# Patient Record
Sex: Male | Born: 1955 | Race: Black or African American | Hispanic: No | Marital: Single | Smoking: Current every day smoker
Health system: Southern US, Community
[De-identification: ages and names within clinical notes are randomized; demographics above are authoritative.]

## PROBLEM LIST (undated history)

## (undated) DIAGNOSIS — R519 Headache, unspecified: Secondary | ICD-10-CM

## (undated) DIAGNOSIS — F1010 Alcohol abuse, uncomplicated: Secondary | ICD-10-CM

## (undated) DIAGNOSIS — K859 Acute pancreatitis without necrosis or infection, unspecified: Secondary | ICD-10-CM

## (undated) DIAGNOSIS — M1990 Unspecified osteoarthritis, unspecified site: Secondary | ICD-10-CM

## (undated) DIAGNOSIS — Z720 Tobacco use: Secondary | ICD-10-CM

## (undated) DIAGNOSIS — E119 Type 2 diabetes mellitus without complications: Secondary | ICD-10-CM

## (undated) DIAGNOSIS — F419 Anxiety disorder, unspecified: Secondary | ICD-10-CM

## (undated) DIAGNOSIS — I509 Heart failure, unspecified: Secondary | ICD-10-CM

## (undated) DIAGNOSIS — K219 Gastro-esophageal reflux disease without esophagitis: Secondary | ICD-10-CM

## (undated) DIAGNOSIS — E785 Hyperlipidemia, unspecified: Secondary | ICD-10-CM

## (undated) DIAGNOSIS — I10 Essential (primary) hypertension: Secondary | ICD-10-CM

## (undated) DIAGNOSIS — I639 Cerebral infarction, unspecified: Secondary | ICD-10-CM

## (undated) DIAGNOSIS — R51 Headache: Secondary | ICD-10-CM

## (undated) DIAGNOSIS — K759 Inflammatory liver disease, unspecified: Secondary | ICD-10-CM

## (undated) DIAGNOSIS — M069 Rheumatoid arthritis, unspecified: Secondary | ICD-10-CM

## (undated) HISTORY — DX: Unspecified osteoarthritis, unspecified site: M19.90

## (undated) HISTORY — PX: WRIST SURGERY: SHX841

## (undated) HISTORY — DX: Anxiety disorder, unspecified: F41.9

## (undated) HISTORY — DX: Rheumatoid arthritis, unspecified: M06.9

## (undated) HISTORY — DX: Gastro-esophageal reflux disease without esophagitis: K21.9

## (undated) HISTORY — DX: Tobacco use: Z72.0

## (undated) HISTORY — PX: OTHER SURGICAL HISTORY: SHX169

## (undated) HISTORY — PX: COLONOSCOPY: SHX5424

## (undated) HISTORY — DX: Alcohol abuse, uncomplicated: F10.10

## (undated) HISTORY — PX: FRACTURE SURGERY: SHX138

## (undated) HISTORY — DX: Hyperlipidemia, unspecified: E78.5

---

## 1992-08-11 DIAGNOSIS — F1020 Alcohol dependence, uncomplicated: Secondary | ICD-10-CM | POA: Insufficient documentation

## 1993-04-09 DIAGNOSIS — F1210 Cannabis abuse, uncomplicated: Secondary | ICD-10-CM | POA: Insufficient documentation

## 1993-04-09 DIAGNOSIS — F1421 Cocaine dependence, in remission: Secondary | ICD-10-CM | POA: Insufficient documentation

## 1993-04-09 DIAGNOSIS — I517 Cardiomegaly: Secondary | ICD-10-CM | POA: Insufficient documentation

## 1999-03-04 ENCOUNTER — Emergency Department (HOSPITAL_COMMUNITY): Admission: EM | Admit: 1999-03-04 | Discharge: 1999-03-04 | Payer: Self-pay | Admitting: Emergency Medicine

## 1999-03-04 ENCOUNTER — Encounter: Payer: Self-pay | Admitting: Emergency Medicine

## 2000-09-27 ENCOUNTER — Emergency Department (HOSPITAL_COMMUNITY): Admission: EM | Admit: 2000-09-27 | Discharge: 2000-09-27 | Payer: Self-pay | Admitting: Emergency Medicine

## 2001-04-01 ENCOUNTER — Emergency Department (HOSPITAL_COMMUNITY): Admission: EM | Admit: 2001-04-01 | Discharge: 2001-04-01 | Payer: Self-pay | Admitting: Emergency Medicine

## 2003-04-05 ENCOUNTER — Emergency Department (HOSPITAL_COMMUNITY): Admission: EM | Admit: 2003-04-05 | Discharge: 2003-04-05 | Payer: Self-pay

## 2003-04-05 ENCOUNTER — Encounter: Payer: Self-pay | Admitting: Emergency Medicine

## 2003-04-07 ENCOUNTER — Encounter: Admission: RE | Admit: 2003-04-07 | Discharge: 2003-04-07 | Payer: Self-pay | Admitting: Internal Medicine

## 2003-05-17 ENCOUNTER — Encounter: Admission: RE | Admit: 2003-05-17 | Discharge: 2003-05-17 | Payer: Self-pay | Admitting: Internal Medicine

## 2003-06-21 ENCOUNTER — Encounter: Admission: RE | Admit: 2003-06-21 | Discharge: 2003-06-21 | Payer: Self-pay | Admitting: Internal Medicine

## 2003-11-26 ENCOUNTER — Ambulatory Visit (HOSPITAL_COMMUNITY): Admission: RE | Admit: 2003-11-26 | Discharge: 2003-11-26 | Payer: Self-pay | Admitting: Cardiology

## 2004-11-30 ENCOUNTER — Encounter
Admission: RE | Admit: 2004-11-30 | Discharge: 2005-01-24 | Payer: Self-pay | Admitting: Physical Medicine & Rehabilitation

## 2004-12-03 ENCOUNTER — Ambulatory Visit: Payer: Self-pay | Admitting: Physical Medicine & Rehabilitation

## 2005-01-24 ENCOUNTER — Encounter
Admission: RE | Admit: 2005-01-24 | Discharge: 2005-04-24 | Payer: Self-pay | Admitting: Physical Medicine & Rehabilitation

## 2005-03-08 ENCOUNTER — Ambulatory Visit: Payer: Self-pay | Admitting: Physical Medicine & Rehabilitation

## 2005-04-16 ENCOUNTER — Ambulatory Visit: Payer: Self-pay | Admitting: Physical Medicine & Rehabilitation

## 2005-05-23 ENCOUNTER — Ambulatory Visit: Payer: Self-pay | Admitting: Physical Medicine & Rehabilitation

## 2005-05-23 ENCOUNTER — Encounter
Admission: RE | Admit: 2005-05-23 | Discharge: 2005-08-21 | Payer: Self-pay | Admitting: Physical Medicine & Rehabilitation

## 2005-08-18 ENCOUNTER — Emergency Department (HOSPITAL_COMMUNITY): Admission: EM | Admit: 2005-08-18 | Discharge: 2005-08-19 | Payer: Self-pay | Admitting: Emergency Medicine

## 2005-08-20 ENCOUNTER — Emergency Department (HOSPITAL_COMMUNITY): Admission: EM | Admit: 2005-08-20 | Discharge: 2005-08-20 | Payer: Self-pay | Admitting: Family Medicine

## 2005-08-28 ENCOUNTER — Emergency Department (HOSPITAL_COMMUNITY): Admission: EM | Admit: 2005-08-28 | Discharge: 2005-08-28 | Payer: Self-pay | Admitting: Family Medicine

## 2006-04-11 ENCOUNTER — Ambulatory Visit: Payer: Self-pay | Admitting: Physical Medicine & Rehabilitation

## 2006-04-23 ENCOUNTER — Emergency Department (HOSPITAL_COMMUNITY): Admission: EM | Admit: 2006-04-23 | Discharge: 2006-04-23 | Payer: Self-pay | Admitting: Family Medicine

## 2006-09-23 ENCOUNTER — Emergency Department (HOSPITAL_COMMUNITY): Admission: EM | Admit: 2006-09-23 | Discharge: 2006-09-23 | Payer: Self-pay | Admitting: Family Medicine

## 2006-12-29 ENCOUNTER — Inpatient Hospital Stay (HOSPITAL_COMMUNITY): Admission: EM | Admit: 2006-12-29 | Discharge: 2006-12-30 | Payer: Self-pay | Admitting: Family Medicine

## 2007-01-08 DIAGNOSIS — F411 Generalized anxiety disorder: Secondary | ICD-10-CM | POA: Insufficient documentation

## 2007-01-08 DIAGNOSIS — E7800 Pure hypercholesterolemia, unspecified: Secondary | ICD-10-CM | POA: Insufficient documentation

## 2007-01-08 DIAGNOSIS — M1990 Unspecified osteoarthritis, unspecified site: Secondary | ICD-10-CM | POA: Insufficient documentation

## 2007-01-08 DIAGNOSIS — K219 Gastro-esophageal reflux disease without esophagitis: Secondary | ICD-10-CM | POA: Insufficient documentation

## 2007-01-08 DIAGNOSIS — E785 Hyperlipidemia, unspecified: Secondary | ICD-10-CM

## 2007-01-08 DIAGNOSIS — F1910 Other psychoactive substance abuse, uncomplicated: Secondary | ICD-10-CM | POA: Insufficient documentation

## 2007-01-08 DIAGNOSIS — M069 Rheumatoid arthritis, unspecified: Secondary | ICD-10-CM | POA: Insufficient documentation

## 2007-01-08 DIAGNOSIS — E669 Obesity, unspecified: Secondary | ICD-10-CM

## 2007-01-08 DIAGNOSIS — R0789 Other chest pain: Secondary | ICD-10-CM

## 2007-01-08 DIAGNOSIS — J438 Other emphysema: Secondary | ICD-10-CM

## 2007-01-28 ENCOUNTER — Telehealth: Payer: Self-pay | Admitting: *Deleted

## 2007-03-02 ENCOUNTER — Ambulatory Visit: Payer: Self-pay | Admitting: *Deleted

## 2007-03-02 ENCOUNTER — Encounter (INDEPENDENT_AMBULATORY_CARE_PROVIDER_SITE_OTHER): Payer: Self-pay | Admitting: *Deleted

## 2007-03-02 DIAGNOSIS — I10 Essential (primary) hypertension: Secondary | ICD-10-CM | POA: Insufficient documentation

## 2007-03-02 DIAGNOSIS — IMO0002 Reserved for concepts with insufficient information to code with codable children: Secondary | ICD-10-CM | POA: Insufficient documentation

## 2007-03-16 ENCOUNTER — Telehealth (INDEPENDENT_AMBULATORY_CARE_PROVIDER_SITE_OTHER): Payer: Self-pay | Admitting: *Deleted

## 2007-03-27 ENCOUNTER — Telehealth (INDEPENDENT_AMBULATORY_CARE_PROVIDER_SITE_OTHER): Payer: Self-pay | Admitting: *Deleted

## 2007-03-31 ENCOUNTER — Ambulatory Visit: Payer: Self-pay | Admitting: Internal Medicine

## 2007-04-27 ENCOUNTER — Telehealth: Payer: Self-pay | Admitting: *Deleted

## 2007-05-12 ENCOUNTER — Encounter (INDEPENDENT_AMBULATORY_CARE_PROVIDER_SITE_OTHER): Payer: Self-pay | Admitting: *Deleted

## 2007-05-12 ENCOUNTER — Ambulatory Visit: Payer: Self-pay | Admitting: *Deleted

## 2007-05-12 DIAGNOSIS — F528 Other sexual dysfunction not due to a substance or known physiological condition: Secondary | ICD-10-CM

## 2007-05-14 ENCOUNTER — Telehealth: Payer: Self-pay | Admitting: *Deleted

## 2007-05-27 ENCOUNTER — Telehealth (INDEPENDENT_AMBULATORY_CARE_PROVIDER_SITE_OTHER): Payer: Self-pay | Admitting: *Deleted

## 2007-05-28 ENCOUNTER — Ambulatory Visit: Payer: Self-pay | Admitting: *Deleted

## 2007-05-28 ENCOUNTER — Encounter (INDEPENDENT_AMBULATORY_CARE_PROVIDER_SITE_OTHER): Payer: Self-pay | Admitting: *Deleted

## 2007-05-28 LAB — CONVERTED CEMR LAB
Barbiturate Quant, Ur: NEGATIVE
Cocaine Metabolites: NEGATIVE
Creatinine,U: 217.4 mg/dL
Phencyclidine (PCP): NEGATIVE

## 2007-05-29 ENCOUNTER — Telehealth (INDEPENDENT_AMBULATORY_CARE_PROVIDER_SITE_OTHER): Payer: Self-pay | Admitting: *Deleted

## 2007-06-19 ENCOUNTER — Emergency Department (HOSPITAL_COMMUNITY): Admission: EM | Admit: 2007-06-19 | Discharge: 2007-06-19 | Payer: Self-pay | Admitting: Family Medicine

## 2007-06-24 ENCOUNTER — Telehealth: Payer: Self-pay | Admitting: Infectious Disease

## 2007-07-27 ENCOUNTER — Telehealth: Payer: Self-pay | Admitting: *Deleted

## 2007-08-21 ENCOUNTER — Telehealth (INDEPENDENT_AMBULATORY_CARE_PROVIDER_SITE_OTHER): Payer: Self-pay | Admitting: *Deleted

## 2007-08-27 ENCOUNTER — Telehealth: Payer: Self-pay | Admitting: Internal Medicine

## 2007-09-10 ENCOUNTER — Ambulatory Visit: Payer: Self-pay | Admitting: Internal Medicine

## 2007-09-17 ENCOUNTER — Telehealth (INDEPENDENT_AMBULATORY_CARE_PROVIDER_SITE_OTHER): Payer: Self-pay | Admitting: *Deleted

## 2007-10-13 ENCOUNTER — Telehealth (INDEPENDENT_AMBULATORY_CARE_PROVIDER_SITE_OTHER): Payer: Self-pay | Admitting: *Deleted

## 2007-10-26 ENCOUNTER — Telehealth: Payer: Self-pay | Admitting: *Deleted

## 2007-11-04 ENCOUNTER — Encounter (INDEPENDENT_AMBULATORY_CARE_PROVIDER_SITE_OTHER): Payer: Self-pay | Admitting: *Deleted

## 2007-11-09 ENCOUNTER — Emergency Department (HOSPITAL_COMMUNITY): Admission: EM | Admit: 2007-11-09 | Discharge: 2007-11-09 | Payer: Self-pay | Admitting: Family Medicine

## 2007-11-23 ENCOUNTER — Ambulatory Visit: Payer: Self-pay | Admitting: Internal Medicine

## 2007-11-23 ENCOUNTER — Telehealth: Payer: Self-pay | Admitting: *Deleted

## 2007-11-23 ENCOUNTER — Encounter (INDEPENDENT_AMBULATORY_CARE_PROVIDER_SITE_OTHER): Payer: Self-pay | Admitting: *Deleted

## 2007-11-23 DIAGNOSIS — K861 Other chronic pancreatitis: Secondary | ICD-10-CM | POA: Insufficient documentation

## 2007-11-23 LAB — CONVERTED CEMR LAB
ALT: 43 units/L (ref 0–53)
AST: 51 units/L — ABNORMAL HIGH (ref 0–37)
Alkaline Phosphatase: 58 units/L (ref 39–117)
Bacteria, UA: NONE SEEN
Basophils Absolute: 0 10*3/uL (ref 0.0–0.1)
Benzodiazepines.: NEGATIVE
Bilirubin Urine: NEGATIVE
CO2: 23 meq/L (ref 19–32)
Cocaine Metabolites: NEGATIVE
Creatinine,U: 216.6 mg/dL
Eosinophils Absolute: 0.1 10*3/uL (ref 0.0–0.7)
Ketones, ur: NEGATIVE mg/dL
Lymphs Abs: 5.3 10*3/uL — ABNORMAL HIGH (ref 0.7–4.0)
MCV: 101.8 fL — ABNORMAL HIGH (ref 78.0–100.0)
Magnesium: 2.2 mg/dL (ref 1.5–2.5)
Microalb Creat Ratio: 85.8 mg/g — ABNORMAL HIGH (ref 0.0–30.0)
Microalb, Ur: 18.4 mg/dL — ABNORMAL HIGH (ref 0.00–1.89)
Neutrophils Relative %: 49 % (ref 43–77)
Phencyclidine (PCP): NEGATIVE
Platelets: 297 10*3/uL (ref 150–400)
Propoxyphene: NEGATIVE
RBC / HPF: NONE SEEN (ref <3)
RDW: 12.5 % (ref 11.5–15.5)
Sodium: 138 meq/L (ref 135–145)
Specific Gravity, Urine: 1.022 (ref 1.005–1.03)
Total Bilirubin: 0.8 mg/dL (ref 0.3–1.2)
Total Protein: 8.6 g/dL — ABNORMAL HIGH (ref 6.0–8.3)
Urine Glucose: NEGATIVE mg/dL
WBC, UA: NONE SEEN cells/hpf (ref <3)
WBC: 12.3 10*3/uL — ABNORMAL HIGH (ref 4.0–10.5)
pH: 6 (ref 5.0–8.0)

## 2007-11-24 ENCOUNTER — Telehealth (INDEPENDENT_AMBULATORY_CARE_PROVIDER_SITE_OTHER): Payer: Self-pay | Admitting: *Deleted

## 2007-12-01 ENCOUNTER — Ambulatory Visit: Payer: Self-pay | Admitting: Internal Medicine

## 2007-12-01 ENCOUNTER — Encounter (INDEPENDENT_AMBULATORY_CARE_PROVIDER_SITE_OTHER): Payer: Self-pay | Admitting: *Deleted

## 2007-12-01 LAB — CONVERTED CEMR LAB
ALT: 30 units/L (ref 0–53)
Albumin: 4.7 g/dL (ref 3.5–5.2)
Bilirubin Urine: NEGATIVE
Blood in Urine, dipstick: NEGATIVE
CO2: 21 meq/L (ref 19–32)
Calcium: 10.1 mg/dL (ref 8.4–10.5)
Chloride: 103 meq/L (ref 96–112)
Eosinophils Absolute: 0.1 10*3/uL (ref 0.0–0.7)
Glucose, Bld: 98 mg/dL (ref 70–99)
HCT: 45.8 % (ref 39.0–52.0)
Ketones, urine, test strip: NEGATIVE
Lipase: 84 units/L — ABNORMAL HIGH (ref 0–75)
Lymphocytes Relative: 63 % — ABNORMAL HIGH (ref 12–46)
Lymphs Abs: 5.9 10*3/uL — ABNORMAL HIGH (ref 0.7–4.0)
MCV: 100 fL (ref 78.0–100.0)
Monocytes Relative: 10 % (ref 3–12)
Neutrophils Relative %: 27 % — ABNORMAL LOW (ref 43–77)
Platelets: 277 10*3/uL (ref 150–400)
RBC: 4.58 M/uL (ref 4.22–5.81)
Sodium: 137 meq/L (ref 135–145)
Total Protein: 8.7 g/dL — ABNORMAL HIGH (ref 6.0–8.3)
WBC Urine, dipstick: NEGATIVE
WBC: 9.4 10*3/uL (ref 4.0–10.5)

## 2007-12-15 ENCOUNTER — Ambulatory Visit: Payer: Self-pay | Admitting: Internal Medicine

## 2008-03-12 ENCOUNTER — Emergency Department (HOSPITAL_COMMUNITY): Admission: EM | Admit: 2008-03-12 | Discharge: 2008-03-12 | Payer: Self-pay | Admitting: Emergency Medicine

## 2008-03-13 ENCOUNTER — Ambulatory Visit: Payer: Self-pay | Admitting: Internal Medicine

## 2008-03-13 ENCOUNTER — Inpatient Hospital Stay (HOSPITAL_COMMUNITY): Admission: EM | Admit: 2008-03-13 | Discharge: 2008-03-15 | Payer: Self-pay | Admitting: Emergency Medicine

## 2008-03-25 ENCOUNTER — Ambulatory Visit: Payer: Self-pay | Admitting: *Deleted

## 2008-03-25 ENCOUNTER — Encounter (INDEPENDENT_AMBULATORY_CARE_PROVIDER_SITE_OTHER): Payer: Self-pay | Admitting: *Deleted

## 2008-03-25 DIAGNOSIS — D72829 Elevated white blood cell count, unspecified: Secondary | ICD-10-CM | POA: Insufficient documentation

## 2008-03-25 DIAGNOSIS — R945 Abnormal results of liver function studies: Secondary | ICD-10-CM | POA: Insufficient documentation

## 2008-03-25 LAB — CONVERTED CEMR LAB
ALT: 23 units/L (ref 0–53)
Albumin: 3.5 g/dL (ref 3.5–5.2)
Basophils Absolute: 0 10*3/uL (ref 0.0–0.1)
CO2: 28 meq/L (ref 19–32)
Chloride: 102 meq/L (ref 96–112)
Eosinophils Relative: 1 % (ref 0–5)
Glucose, Bld: 128 mg/dL — ABNORMAL HIGH (ref 70–99)
Lymphocytes Relative: 44 % (ref 12–46)
Lymphs Abs: 4.6 10*3/uL — ABNORMAL HIGH (ref 0.7–4.0)
Neutro Abs: 5 10*3/uL (ref 1.7–7.7)
Neutrophils Relative %: 48 % (ref 43–77)
Platelets: 298 10*3/uL (ref 150–400)
Potassium: 3.9 meq/L (ref 3.5–5.3)
RDW: 13.1 % (ref 11.5–15.5)
Sodium: 137 meq/L (ref 135–145)
Total Bilirubin: 0.6 mg/dL (ref 0.3–1.2)
Total Protein: 6.9 g/dL (ref 6.0–8.3)
WBC: 10.4 10*3/uL (ref 4.0–10.5)

## 2008-03-30 ENCOUNTER — Telehealth (INDEPENDENT_AMBULATORY_CARE_PROVIDER_SITE_OTHER): Payer: Self-pay | Admitting: *Deleted

## 2008-06-06 ENCOUNTER — Telehealth: Payer: Self-pay | Admitting: Internal Medicine

## 2008-06-20 ENCOUNTER — Telehealth: Payer: Self-pay | Admitting: Internal Medicine

## 2008-09-02 ENCOUNTER — Telehealth: Payer: Self-pay | Admitting: Internal Medicine

## 2008-09-06 ENCOUNTER — Encounter: Payer: Self-pay | Admitting: Internal Medicine

## 2008-10-18 ENCOUNTER — Encounter: Payer: Self-pay | Admitting: Internal Medicine

## 2008-11-08 ENCOUNTER — Encounter: Payer: Self-pay | Admitting: Internal Medicine

## 2008-11-08 ENCOUNTER — Ambulatory Visit: Payer: Self-pay | Admitting: Infectious Disease

## 2008-11-09 DIAGNOSIS — J309 Allergic rhinitis, unspecified: Secondary | ICD-10-CM | POA: Insufficient documentation

## 2008-11-09 LAB — CONVERTED CEMR LAB
AST: 34 units/L (ref 0–37)
Alkaline Phosphatase: 108 units/L (ref 39–117)
Glucose, Bld: 98 mg/dL (ref 70–99)
HDL: 40 mg/dL (ref >39)
LDL Cholesterol: 24 mg/dL (ref 0–99)
Rhuematoid fact SerPl-aCnc: 60 intl units/mL — ABNORMAL HIGH (ref 0–20)
Sodium: 136 meq/L (ref 135–145)
Total Bilirubin: 0.8 mg/dL (ref 0.3–1.2)
Total Protein: 8.4 g/dL — ABNORMAL HIGH (ref 6.0–8.3)
Triglycerides: 352 mg/dL — ABNORMAL HIGH (ref <150)
VLDL: 70 mg/dL — ABNORMAL HIGH (ref 0–40)

## 2008-11-27 ENCOUNTER — Encounter: Payer: Self-pay | Admitting: Internal Medicine

## 2008-12-03 ENCOUNTER — Telehealth (INDEPENDENT_AMBULATORY_CARE_PROVIDER_SITE_OTHER): Payer: Self-pay | Admitting: Internal Medicine

## 2008-12-05 ENCOUNTER — Encounter: Payer: Self-pay | Admitting: Internal Medicine

## 2008-12-05 ENCOUNTER — Ambulatory Visit: Payer: Self-pay | Admitting: Internal Medicine

## 2008-12-05 ENCOUNTER — Other Ambulatory Visit: Admission: RE | Admit: 2008-12-05 | Discharge: 2008-12-05 | Payer: Self-pay | Admitting: Internal Medicine

## 2008-12-05 DIAGNOSIS — B081 Molluscum contagiosum: Secondary | ICD-10-CM

## 2008-12-06 ENCOUNTER — Encounter: Payer: Self-pay | Admitting: Internal Medicine

## 2008-12-07 ENCOUNTER — Encounter: Payer: Self-pay | Admitting: Internal Medicine

## 2008-12-14 ENCOUNTER — Emergency Department (HOSPITAL_COMMUNITY): Admission: EM | Admit: 2008-12-14 | Discharge: 2008-12-14 | Payer: Self-pay | Admitting: Emergency Medicine

## 2008-12-16 ENCOUNTER — Inpatient Hospital Stay (HOSPITAL_COMMUNITY): Admission: EM | Admit: 2008-12-16 | Discharge: 2008-12-19 | Payer: Self-pay | Admitting: Emergency Medicine

## 2008-12-16 ENCOUNTER — Ambulatory Visit: Payer: Self-pay | Admitting: Internal Medicine

## 2008-12-19 ENCOUNTER — Encounter (INDEPENDENT_AMBULATORY_CARE_PROVIDER_SITE_OTHER): Payer: Self-pay | Admitting: Internal Medicine

## 2008-12-19 DIAGNOSIS — E876 Hypokalemia: Secondary | ICD-10-CM

## 2008-12-27 ENCOUNTER — Telehealth: Payer: Self-pay | Admitting: Internal Medicine

## 2009-01-04 ENCOUNTER — Encounter: Payer: Self-pay | Admitting: Internal Medicine

## 2009-01-11 ENCOUNTER — Encounter: Payer: Self-pay | Admitting: Internal Medicine

## 2009-01-11 ENCOUNTER — Ambulatory Visit: Payer: Self-pay | Admitting: Internal Medicine

## 2009-01-11 LAB — CONVERTED CEMR LAB
AST: 35 units/L (ref 0–37)
Alkaline Phosphatase: 124 units/L — ABNORMAL HIGH (ref 39–117)
BUN: 6 mg/dL (ref 6–23)
Creatinine, Ser: 0.85 mg/dL (ref 0.40–1.50)
HDL: 38 mg/dL — ABNORMAL LOW (ref >39)
Hemoglobin, Urine: NEGATIVE
LDL Cholesterol: 60 mg/dL (ref 0–99)
Leukocytes, UA: NEGATIVE
Nitrite: NEGATIVE
Protein, ur: NEGATIVE mg/dL
Total CHOL/HDL Ratio: 4.5
Urobilinogen, UA: 0.2 (ref 0.0–1.0)
VLDL: 73 mg/dL — ABNORMAL HIGH (ref 0–40)

## 2009-01-27 ENCOUNTER — Telehealth: Payer: Self-pay | Admitting: Internal Medicine

## 2009-01-30 ENCOUNTER — Telehealth: Payer: Self-pay | Admitting: Internal Medicine

## 2009-02-24 ENCOUNTER — Telehealth: Payer: Self-pay | Admitting: Internal Medicine

## 2009-03-28 ENCOUNTER — Encounter: Payer: Self-pay | Admitting: Internal Medicine

## 2009-04-14 ENCOUNTER — Ambulatory Visit: Payer: Self-pay | Admitting: Internal Medicine

## 2009-04-14 ENCOUNTER — Encounter: Payer: Self-pay | Admitting: Internal Medicine

## 2009-04-14 LAB — CONVERTED CEMR LAB
ALT: 28 units/L (ref 0–53)
AST: 34 units/L (ref 0–37)
CO2: 29 meq/L (ref 19–32)
Creatinine, Ser: 0.93 mg/dL (ref 0.40–1.50)
GFR calc Af Amer: >60 mL/min (ref >60)
HDL: 37 mg/dL — ABNORMAL LOW (ref >39)
LDL Cholesterol: 44 mg/dL (ref 0–99)
Total Bilirubin: 0.6 mg/dL (ref 0.3–1.2)
Triglycerides: 301 mg/dL — ABNORMAL HIGH (ref <150)

## 2009-04-27 ENCOUNTER — Telehealth: Payer: Self-pay | Admitting: Internal Medicine

## 2009-05-26 ENCOUNTER — Telehealth: Payer: Self-pay | Admitting: Internal Medicine

## 2009-05-30 ENCOUNTER — Telehealth: Payer: Self-pay | Admitting: Internal Medicine

## 2009-06-28 ENCOUNTER — Telehealth: Payer: Self-pay | Admitting: Internal Medicine

## 2009-07-26 ENCOUNTER — Telehealth: Payer: Self-pay | Admitting: Internal Medicine

## 2009-08-21 ENCOUNTER — Ambulatory Visit: Payer: Self-pay | Admitting: Internal Medicine

## 2009-08-22 ENCOUNTER — Telehealth: Payer: Self-pay | Admitting: Internal Medicine

## 2009-08-22 ENCOUNTER — Encounter (INDEPENDENT_AMBULATORY_CARE_PROVIDER_SITE_OTHER): Payer: Self-pay | Admitting: Internal Medicine

## 2009-08-22 LAB — CONVERTED CEMR LAB
ALT: 30 units/L (ref 0–53)
CO2: 19 meq/L (ref 19–32)
Cholesterol: 135 mg/dL (ref 0–200)
Creatinine, Ser: 0.87 mg/dL (ref 0.40–1.50)
HDL: 49 mg/dL (ref >39)
Total Bilirubin: 0.5 mg/dL (ref 0.3–1.2)
Total CHOL/HDL Ratio: 2.8
Triglycerides: 108 mg/dL (ref <150)
VLDL: 22 mg/dL (ref 0–40)

## 2009-08-28 ENCOUNTER — Telehealth: Payer: Self-pay | Admitting: Internal Medicine

## 2009-09-27 ENCOUNTER — Telehealth: Payer: Self-pay | Admitting: Internal Medicine

## 2009-10-26 ENCOUNTER — Telehealth: Payer: Self-pay | Admitting: Internal Medicine

## 2009-11-27 ENCOUNTER — Telehealth: Payer: Self-pay | Admitting: Internal Medicine

## 2009-12-11 ENCOUNTER — Telehealth: Payer: Self-pay | Admitting: Internal Medicine

## 2009-12-26 ENCOUNTER — Telehealth: Payer: Self-pay | Admitting: Internal Medicine

## 2010-01-26 ENCOUNTER — Telehealth: Payer: Self-pay | Admitting: Internal Medicine

## 2010-02-22 ENCOUNTER — Telehealth: Payer: Self-pay | Admitting: Internal Medicine

## 2010-03-27 ENCOUNTER — Telehealth: Payer: Self-pay | Admitting: Internal Medicine

## 2010-04-02 ENCOUNTER — Encounter: Payer: Self-pay | Admitting: Internal Medicine

## 2010-04-02 ENCOUNTER — Ambulatory Visit: Payer: Self-pay | Admitting: Internal Medicine

## 2010-04-03 LAB — CONVERTED CEMR LAB
Amphetamine Screen, Ur: NEGATIVE
Barbiturate Quant, Ur: NEGATIVE
Cocaine Metabolites: NEGATIVE
Creatinine, Urine: 124.7 mg/dL
Marijuana Metabolite: NEGATIVE
Opiates: POSITIVE — AB
Propoxyphene: NEGATIVE

## 2010-04-27 ENCOUNTER — Telehealth: Payer: Self-pay | Admitting: Internal Medicine

## 2010-05-22 ENCOUNTER — Telehealth: Payer: Self-pay | Admitting: Internal Medicine

## 2010-06-19 ENCOUNTER — Telehealth: Payer: Self-pay | Admitting: Internal Medicine

## 2010-07-26 ENCOUNTER — Telehealth: Payer: Self-pay | Admitting: Internal Medicine

## 2010-08-09 ENCOUNTER — Encounter: Payer: Self-pay | Admitting: Internal Medicine

## 2010-08-24 ENCOUNTER — Telehealth: Payer: Self-pay | Admitting: Internal Medicine

## 2010-09-17 ENCOUNTER — Encounter: Payer: Self-pay | Admitting: Internal Medicine

## 2010-09-25 ENCOUNTER — Telehealth: Payer: Self-pay | Admitting: Internal Medicine

## 2010-10-02 ENCOUNTER — Ambulatory Visit: Payer: Self-pay | Admitting: Internal Medicine

## 2010-10-03 ENCOUNTER — Encounter: Payer: Self-pay | Admitting: Internal Medicine

## 2010-10-03 LAB — CONVERTED CEMR LAB
AST: 65 units/L — ABNORMAL HIGH (ref 0–37)
Albumin: 3.8 g/dL (ref 3.5–5.2)
Alkaline Phosphatase: 96 units/L (ref 39–117)
BUN: 7 mg/dL (ref 6–23)
Calcium: 9.2 mg/dL (ref 8.4–10.5)
Creatinine, Ser: 0.84 mg/dL (ref 0.40–1.50)
Cyclic Citrullin Peptide Ab: <2 units (ref 0.0–5.0)
HCT: 40.8 % (ref 39.0–52.0)
HDL: 51 mg/dL (ref >39)
Indirect Bilirubin: 0.4 mg/dL (ref 0.0–0.9)
MCV: 97.8 fL (ref >=78.0)
Platelets: 194 10*3/uL (ref 150–400)
RDW: 12.4 % (ref 11.5–15.5)
Total Bilirubin: 0.5 mg/dL (ref 0.3–1.2)
Total Protein: 6.9 g/dL (ref 6.0–8.3)
Triglycerides: 227 mg/dL — ABNORMAL HIGH (ref <150)

## 2010-10-17 ENCOUNTER — Ambulatory Visit: Payer: Self-pay | Admitting: Internal Medicine

## 2010-10-17 DIAGNOSIS — M79609 Pain in unspecified limb: Secondary | ICD-10-CM | POA: Insufficient documentation

## 2010-10-24 ENCOUNTER — Telehealth (INDEPENDENT_AMBULATORY_CARE_PROVIDER_SITE_OTHER): Payer: Self-pay | Admitting: *Deleted

## 2010-11-22 ENCOUNTER — Telehealth: Payer: Self-pay | Admitting: *Deleted

## 2010-11-27 NOTE — Progress Notes (Signed)
Summary: refill/gg  Phone Note Refill Request  on April 27, 2010 10:29 AM  Refills Requested: Medication #1:  HYDROCODONE-ACETAMINOPHEN 5-500 MG  TABS Take 1 tablet by mouth every 6 hours as needed   Last Refilled: 03/27/2010  Medication #2:  ALPRAZOLAM 1 MG TABS Take a half tab as needed twice a day and one tablet at bedtime   Last Refilled: 03/27/2010  Method Requested: Telephone to Pharmacy Initial call taken by: Merrie Roof RN,  April 27, 2010 10:30 AM  Follow-up for Phone Call        Refill approved-nurse to complete    Prescriptions: HYDROCODONE-ACETAMINOPHEN 5-500 MG  TABS (HYDROCODONE-ACETAMINOPHEN) Take 1 tablet by mouth every 6 hours as needed  #120 x 0   Entered and Authorized by:   Vassie Loll MD   Signed by:   Vassie Loll MD on 04/27/2010   Method used:   Telephoned to ...       [facility] [phone]* (retail)       [address]       [place], [place]  [address]       Ph: [phone] or [phone]       Fax: [phone]   RxID:   [id number] ALPRAZOLAM 1 MG TABS (ALPRAZOLAM) Take a half tab as needed twice a day and one tablet at bedtime  #62 x 0   Entered and Authorized by:   Vassie Loll MD   Signed by:   Vassie Loll MD on 04/27/2010   Method used:   Telephoned to ...       [facility] [phone]* (retail)       [address]       [place], [place]  [address]       Ph: [phone] or [phone]       Fax: [phone]   RxID:   [id number]   Appended Document: refill/gg rx called in

## 2010-11-27 NOTE — Assessment & Plan Note (Signed)
Summary: ACUTE-REQUESTING MEDICATION REFILL/CFB   Vital Signs:  Patient profile:   55 year old male Height:      67 inches Weight:      160.0 pounds BMI:     25.15 Temp:     97.4 degrees F oral Pulse rate:   50 / minute BP sitting:   113 / 67  (right arm)  Vitals Entered By: Filomena Jungling NT II (October 02, 2010 11:06 AM) CC: WART ON BOTTOM OF FOOT/ WRIST PAIN AND HEADACHES Is Patient Diabetic? No Pain Assessment Patient in pain? yes     Location: WRIST, HEADACHES Intensity: 3 Type: aching Nutritional Status BMI of 25 - 29 = overweight  Have you ever been in a relationship where you felt threatened, hurt or afraid?No   Does patient need assistance? Functional Status Self care Ambulation Normal   Primary Care Provider:  Vassie Loll MD  CC:  WART ON BOTTOM OF FOOT/ WRIST PAIN AND HEADACHES.  History of Present Illness: Pt with multiple medical problems as outlined below, here on f/u visit, complaining of mild headache and wrist pain (has chronic pain at baseline but states this is a bit worse). Pain is currently 3/10. He is taking Hydrocodone 5/500mg . He was last seen here in June 2011 by Dr. Rogelia Boga, and it seems he may have a number of diagnosis that are not actually proven by lab and/or imaging.  No other acute issues today. Suggested he tried Ibuprofen for his pain, as there may be an inflammatory component. Otherwise, will followup on issues from his prior visit in June,order the labs to confirm his RA, no changes to his med regimen will be made today and he should followup with Dr. Gwenlyn Perking in about a month.   Will check labs below per Dr. Lenoria Farrier office request and fax them over so he doesn't need to get them twice.   Preventive Screening-Counseling & Management  Alcohol-Tobacco     Alcohol type:  BEER EVERY NOW AND THEN     Smoking Status: current     Smoking Cessation Counseling: yes     Packs/Day: 3-4 cigs per day     Year Started: AT THE AGE OF 18  Passive Smoke Exposure: no  Caffeine-Diet-Exercise     Does Patient Exercise: yes     Type of exercise: WALKING     Times/week: 1-2  Current Problems (verified): 1)  Uti  (ICD-599.0) 2)  Hypokalemia  (ICD-276.8) 3)  Molluscum Contagiosum  (ICD-078.0) 4)  Allergic Rhinitis  (ICD-477.9) 5)  Liver Function Tests, Abnormal  (ICD-794.8) 6)  Leukocytosis Unspecified  (ICD-288.60) 7)  Pancreatitis, Chronic  (ICD-577.1) 8)  Erectile Dysfunction  (ICD-302.72) 9)  Hypertension  (ICD-401.9) 10)  Shin Splints  (ICD-844.9) 11)  Emphysema  (ICD-492.8) 12)  Obesity Nos  (ICD-278.00) 13)  Substance Abuse, Multiple  (ICD-305.90) 14)  Chest Pain, Non-cardiac  (ICD-786.59) 15)  Gerd  (ICD-530.81) 16)  Osteoarthritis  (ICD-715.90) 17)  Rheumatoid Arthritis  (ICD-714.0) 18)  Hyperlipidemia  (ICD-272.4) 19)  Anxiety  (ICD-300.00)  Current Medications (verified): 1)  Metoprolol Tartrate 25 Mg Tabs (Metoprolol Tartrate) .... Take 1 Tablet By Mouth Two Times A Day 2)  Aspir-Low 81 Mg Tbec (Aspirin) .... Take 1 Tablet By Mouth Once A Day 3)  Lopid 600 Mg Tabs (Gemfibrozil) .... Take 1 Tablet By Mouth Two Times A Day 4)  Lisinopril 5 Mg Tabs (Lisinopril) .... Take 1 Tablet By Mouth Once A Day 5)  Alprazolam 1 Mg Tabs (Alprazolam) .Marland KitchenMarland KitchenMarland Kitchen  Take A Half Tab As Needed Twice A Day and One Tablet At Bedtime 6)  Hydrocodone-Acetaminophen 5-500 Mg  Tabs (Hydrocodone-Acetaminophen) .... Take 1 Tablet By Mouth Every 6 Hours As Needed 7)  Amlodipine Besylate 2.5 Mg Tabs (Amlodipine Besylate) .... Take 1 Tablet By Mouth Once A Day  Allergies (verified): No Known Drug Allergies  Review of Systems  The patient denies fever, vision loss, chest pain, dyspnea on exertion, prolonged cough, abdominal pain, melena, hematochezia, muscle weakness, unusual weight change, and abnormal bleeding.    Physical Exam  General:  alert, well-developed, and well-nourished.   Head:  normocephalic and atraumatic.   Eyes:  vision  grossly intact.   Ears:  R ear normal and L ear normal.   Nose:  no external deformity.   Mouth:  poor dentition.   Lungs:  normal respiratory effort, normal breath sounds, no crackles, and no wheezes.   Heart:  normal rate, regular rhythm, no murmur, no gallop, and no rub.   Abdomen:  soft, non-tender, normal bowel sounds, no distention, no masses, no guarding, and no rigidity.   Msk:  normal ROM, no joint swelling, no joint warmth, no redness over joints, and no joint deformities.   Extremities:  no edema or cyanosis Neurologic:  alert & oriented X3 and gait normal.   Skin:  color normal  Psych:  Oriented X3, normally interactive, good eye contact, not anxious appearing, and not depressed appearing.     Impression & Recommendations:  Problem # 1:  RHEUMATOID ARTHRITIS (ICD-714.0) Per last note, will followup on labs as below.   Encouraged to take over the counter Ibuprofen in the meantime for his chronic pain, which is in addition to his hydrocodone, instructed to call the clinic if the symptoms worsen or if he has further questions/concerns.   Orders: T-C-Reactive Protein [phone]) T-Rheumatoid Factor ([phone]) T- * Misc. Laboratory test [phone])  Problem # 2:  HYPERTENSION (ICD-401.9) Well controlled on current regimen. Will need a BMP at his next visit.   His updated medication list for this problem includes:    Metoprolol Tartrate 25 Mg Tabs (Metoprolol tartrate) .Marland Kitchen... Take 1 tablet by mouth two times a day    Lisinopril 5 Mg Tabs (Lisinopril) .Marland Kitchen... Take 1 tablet by mouth once a day    Amlodipine Besylate 2.5 Mg Tabs (Amlodipine besylate) .Marland Kitchen... Take 1 tablet by mouth once a day  BP today: 113/67 Prior BP: 149/83 (04/02/2010)  Labs Reviewed: K+: 4.1 (08/22/2009) Creat: : 0.87 (08/22/2009)   Chol: 135 (08/22/2009)   HDL: 49 (08/22/2009)   LDL: 64 (08/22/2009)   TG: 108 (08/22/2009)  His updated medication list for this problem includes:    Metoprolol Tartrate 25  Mg Tabs (Metoprolol tartrate) .Marland Kitchen... Take 1 tablet by mouth two times a day    Lisinopril 5 Mg Tabs (Lisinopril) .Marland Kitchen... Take 1 tablet by mouth once a day    Amlodipine Besylate 2.5 Mg Tabs (Amlodipine besylate) .Marland Kitchen... Take 1 tablet by mouth once a day  Problem # 3:  HYPERLIPIDEMIA (ICD-272.4) Will need FLP at his next visit.  His updated medication list for this problem includes:    Lopid 600 Mg Tabs (Gemfibrozil) .Marland Kitchen... Take 1 tablet by mouth two times a day  Orders: T-Lipid Profile ([id number])  Labs Reviewed: SGOT: 29 (08/22/2009)   SGPT: 30 (08/22/2009)   HDL:49 (08/22/2009), 37 (04/14/2009)  LDL:64 (08/22/2009), 44 (04/14/2009)  Chol:135 (08/22/2009), 141 (04/14/2009)  Trig:108 (08/22/2009), 301 (04/14/2009)  Complete Medication List: 1)  Metoprolol Tartrate  25 Mg Tabs (Metoprolol tartrate) .... Take 1 tablet by mouth two times a day 2)  Aspir-low 81 Mg Tbec (Aspirin) .... Take 1 tablet by mouth once a day 3)  Lopid 600 Mg Tabs (Gemfibrozil) .... Take 1 tablet by mouth two times a day 4)  Lisinopril 5 Mg Tabs (Lisinopril) .... Take 1 tablet by mouth once a day 5)  Alprazolam 1 Mg Tabs (Alprazolam) .... Take a half tab as needed twice a day and one tablet at bedtime 6)  Hydrocodone-acetaminophen 5-500 Mg Tabs (Hydrocodone-acetaminophen) .... Take 1 tablet by mouth every 6 hours as needed 7)  Amlodipine Besylate 2.5 Mg Tabs (Amlodipine besylate) .... Take 1 tablet by mouth once a day  Other Orders: T-Hemoccult Card-Multiple (take home) (16109) T-Basic Metabolic Panel ([id number]) T-CBC No Diff ([id number]) T-Hepatic Function [phone]) T-PSA (96295-28413) T-Vitamin D (25-Hydroxy) [phone])  Patient Instructions: 1)  You can take over the counter Ibuprofen for your headaches and wrist pain. Make sure to take with meals and only as needed. 2)  Pls schedule a followup appointment with Dr. Gwenlyn Perking for the next one month.  3)  Call the clinic if you have any other problems or  concerns.    Orders Added: 1)  T-C-Reactive Protein [[id number]] 2)  T-Rheumatoid Factor [[id number]] 3)  T- * Misc. Laboratory test [99999] 4)  T-Hemoccult Card-Multiple (take home) [82270] 5)  T-Basic Metabolic Panel [[id number]] 6)  T-CBC No Diff [[id number]] 7)  T-Lipid Profile [80061-22930] 8)  T-Hepatic Function [80076-22960] 9)  T-PSA [43329-51884] 10)  T-Vitamin D (25-Hydroxy) [16606-30160] 11)  Est. Patient Level IV [10932]   Process Orders Check Orders Results:     [facility]: ABN not required for this insurance Tests Sent for requisitioning (October 02, 2010 12:03 PM):     10/02/2010: [facility] -- T-C-Reactive Protein [phone] (signed)     10/02/2010: [facility] -- T-Rheumatoid Factor [phone] (signed)     10/02/2010: [facility] -- T- * Misc. Laboratory test [99999] (signed)     10/02/2010: [facility] -- T-Basic Metabolic Panel [phone] (signed)     10/02/2010: [facility] -- T-CBC No Diff [[id number]] (signed)     10/02/2010: [facility] -- T-Lipid Profile [phone] (signed)     10/02/2010: [facility] -- T-Hepatic Function [phone] (signed)     10/02/2010: [facility] -- T-PSA [phone] (signed)     10/02/2010: [facility] -- T-Vitamin D (25-Hydroxy) [phone] (signed)     Prevention & Chronic Care Immunizations   Influenza vaccine: refuses  (09/10/2007)   Influenza vaccine deferral: Refused  (08/21/2009)    Tetanus booster: Not documented    Pneumococcal vaccine: Not documented  Colorectal Screening   Hemoccult: Not documented   Hemoccult action/deferral: Ordered  (10/02/2010)    Colonoscopy: Not documented  Other Screening   PSA: Not documented   PSA ordered.   Smoking status: current  (10/02/2010)   Smoking cessation counseling: yes   (10/02/2010)  Lipids   Total Cholesterol: 135  (08/22/2009)   LDL: 64  (08/22/2009)   LDL Direct: Not documented   HDL: 49  (08/22/2009)   Triglycerides: 108  (08/22/2009)    SGOT (AST): 29  (08/22/2009)   SGPT (ALT): 30  (08/22/2009)   Alkaline phosphatase: 93  (08/22/2009)   Total bilirubin: 0.5  (08/22/2009)    Lipid flowsheet reviewed?: Yes   Progress toward LDL goal: Unchanged  Hypertension   Last Blood Pressure: 113 / 67  (  10/02/2010)   Serum creatinine: 0.87  (08/22/2009)   Serum potassium 4.1  (08/22/2009)    Hypertension flowsheet reviewed?: Yes   Progress toward BP goal: Improved  Self-Management Support :   Personal Goals (by the next clinic visit) :      Personal blood pressure goal: 140/90  (08/21/2009)     Personal LDL goal: 130  (08/21/2009)    Hypertension self-management support: Resources for patients handout  (04/02/2010)    Lipid self-management support: Resources for patients handout  (04/02/2010)    Nursing Instructions: Provide Hemoccult cards with instructions (see order)   Process Orders Check Orders Results:     [facility]: ABN not required for this insurance Tests Sent for requisitioning (October 02, 2010 12:03 PM):     10/02/2010: [facility] -- T-C-Reactive Protein [phone] (signed)     10/02/2010: [facility] -- T-Rheumatoid Factor [phone] (signed)     10/02/2010: [facility] -- T- * Misc. Laboratory test [99999] (signed)     10/02/2010: [facility] -- T-Basic Metabolic Panel [phone] (signed)     10/02/2010: [facility] -- T-CBC No Diff [[id number]] (signed)     10/02/2010: [facility] -- T-Lipid Profile [phone] (signed)     10/02/2010: [facility] -- T-Hepatic Function [phone] (signed)     10/02/2010: [facility] -- T-PSA [phone] (signed)     10/02/2010:  [facility] -- T-Vitamin D (25-Hydroxy) [phone] (signed)

## 2010-11-27 NOTE — Miscellaneous (Signed)
Summary: MEDICATION CONTRACT  MEDICATION CONTRACT   Imported By: Margie Billet 04/04/2010 11:04:38  _____________________________________________________________________  External Attachment:    Type:   Image     Comment:   External Document

## 2010-11-27 NOTE — Assessment & Plan Note (Signed)
Summary: ACUTE/MADERA/OV/CH   Vital Signs:  Patient profile:   56 year old male Height:      67 inches (170.18 cm) Weight:      158.2 pounds (70.95 kg) BMI:     24.87 Temp:     97.0 degrees F (36.11 degrees C) oral Pulse rate:   71 / minute BP sitting:   149 / 83  (right arm) Cuff size:   regular  Vitals Entered By: Theotis Barrio NT II (April 02, 2010 11:14 AM)  CC: ROUTINE OFFICE VISIT /  BILATERAL SHOULDER PAIN /  BILATERAL ANKLE PAIN   Is Patient Diabetic? No Pain Assessment Patient in pain? yes     Location: SHOULDER/ANKLE Intensity:     8    /   8 Type: sharp Onset of pain  CHRONIC  PAIN Nutritional Status BMI of 19 -24 = normal  Have you ever been in a relationship where you felt threatened, hurt or afraid?No   Does patient need assistance? Functional Status Self care Ambulation Normal Comments ROUTINE OFFICE VISIT  /  BILATERAL SHOULDER PAIN /  BILATERAL ANKLE PAIN   Primary Care Provider:  Vassie Loll MD  CC:  ROUTINE OFFICE VISIT /  BILATERAL SHOULDER PAIN /  BILATERAL ANKLE PAIN  .  History of Present Illness: [name] is a 55 yo male,pt of Dr Gwenlyn Perking.  Pt has not been seen since 10/10 and told needed appt before any more refills given.  1.  HTN - Pt supposed to be on Meteoprolol, lisinopril, and norvasc but according to refills, would have run out in Oct / Nov. BP today 149/83. Pt states seeing Dr Algie Coffer, cards, for his heart issues and he is R'xing the cardiac meds. Pt doesn't know what meds he is on. BMP OK 10/10. Micralbumin elevated 1/09 - not diabetic but uncontrolled HTN.  A/P Request records Dr Algie Coffer. No change in meds until review records. Recheck microalb  2.  Hyperlipidemia - Lipids OK and LFT's OK 10/10.  Pt on Lopid but again supposedly Dr Algie Coffer is now Rx'ing.    A/P Request records.  3.  Self report of HF - Nothing in EChart to confirm this and no records from [place]. Had myoview 3/08 that showed EF 59% and possible fixed, inferior wall  defect. Pt has his first ECHO scheduled per cards.   A/P request records  4.  RA - Pt had elevated RF 1/10.  At that time, the plan was to get CCP, CRP, ESR  to confirm and then start DMARDS.  Pt never had additional testing nor rheum referral and was started on plaqunil in 2010. Today, pt knows nothing about plaqunil.  His pain isn't typical for RA - not symmetric, he C/O R Ankle and L hand along with shoulders, knees, head pain, wrist pain, and abd pain.  And no AM stiffness - he does c/o L hand getting stiff / cramp but without any temporal pattern.  Exam not typical of RA.  A/P Will need CCP, CRP, and repeat RF next blood draw.  5.  Pancreatitis - supposedly chronic but no confirmatory studies.  No CT or ERCP to confrim.  Plain films show no calcificnations.  Pt is a bit iffy about chronic ABD pain.  Has 3-4 loose BM's daily. Used to be on pancrease but not taking any more.  Admits for acute flares thought to be 2/2 ETOH.    A/P See chronic pain below.  6.  Chronic pain - pt  c/o pain in hand, ankle, shoulders, knees, head, abd, etc.  He can't give me a diagnosis and I can't find a diagnosis in the chart either. Pt on chronic hydrocodone. Get #120 monthly (has contract). Pt tried to tell me he gets 6 daily. I ran name through [facility] database and no concerns - one MD, one pharmacy. He states he has not missed a dose. He understands I am checking UDS today.   A/P UDS.  F/U primary MD 3 months. Other refills OK via phone.  7.  Anxiety - on alprazolam for a couple of years. Supposed to be on 1/2 two times a day and 1 at bedtime for #60 monthly. I am very concerned bc pt started by telling me he takes 1/2 two times a day. When I confirmed he needs 30 per month he said yes, then later no it was increased. He said he took it exactly as stated on bottle but pt couldn't even tell me correctly how he was to take it. Only that it wasn't strong enough. This is my first visit with him so I have to interpret this  carefully.  A/P UDS.  Likely would benefit from pysch referral for long acting benzos and SSRI.  8.  Urinary sxs of hesitancy and flow issues - will have to deal with at next visit but I did encourage him to F/U with his urologist he was seeing for ED and gave him the name and tele #. No fam h/x of prosate CA.  A/P Urology appt or address next visit.  9.  Insomnia - can't sleep bc "real nervous".  States to bed 11PM, falls asleep 6AM, sleeps 1-1.5 hrs, 30 min nap during day.  Going on for one year.  A/P Verify that he is taking the Alprazolam. If so, reinforce the at bedtime dosing. Also, an SSRI might help the pain, anxiety, and cause drowiness.    10.  Health maintance - next visit.    Preventive Screening-Counseling & Management  Alcohol-Tobacco     Alcohol type:  BEER EVERY NOW AND THEN     Smoking Status: current     Smoking Cessation Counseling: yes     Packs/Day: 3-4 cigs per day     Year Started: AT THE AGE OF 18     Passive Smoke Exposure: no  Caffeine-Diet-Exercise     Does Patient Exercise: yes     Type of exercise: WALKING     Times/week: 1-2 Pt didn't bring meds and didn't know name of meds except his vicodin and his alprazolam.  Doesn't recognize the name Plaquenil.  And isn't on a PPI nor pancrease.  Will need to get Kadakia's notes to verify cardiac meds.   Medications Prior to Update: 1)  Metoprolol Tartrate 25 Mg Tabs (Metoprolol Tartrate) .... Take 1 Tablet By Mouth Two Times A Day 2)  Aspir-Low 81 Mg Tbec (Aspirin) .... Take 1 Tablet By Mouth Once A Day 3)  Lopid 600 Mg Tabs (Gemfibrozil) .... Take 1 Tablet By Mouth Two Times A Day 4)  Lisinopril 5 Mg Tabs (Lisinopril) .... Take 1 Tablet By Mouth Once A Day 5)  Alprazolam 1 Mg Tabs (Alprazolam) .... Take A Half Tab As Needed Twice A Day and One Tablet At Bedtime 6)  Hydrocodone-Acetaminophen 5-500 Mg  Tabs (Hydrocodone-Acetaminophen) .... Take 1 Tablet By Mouth Every 6 Hours As Needed 7)  Amlodipine Besylate  2.5 Mg Tabs (Amlodipine Besylate) .... Take 1 Tablet By Mouth Once A Day 8)  Folic Acid 1 Mg  Tabs (Folic Acid) .... Take 1 Tablet By Mouth Once A Day 9)  Thiamine Hcl 100 Mg  Tabs (Thiamine Hcl) .... Take 1 Tablet By Mouth Once A Day 10)  Protonix 40 Mg Tbec (Pantoprazole Sodium) .... Take 1 Tablet By Mouth Two Times A Day 11)  Plaquenil 200 Mg Tabs (Hydroxychloroquine Sulfate) .... Take 1 Tablet By Mouth Once A Day 12)  Pancrease Mt 20 56-20-44 Mu Cpep (Amylase-Lipase-Protease) .... Take 1 Tablet By Mouth 30 Mins Before  Meals Three Times A Day. 13)  Ultram 50 Mg Tabs (Tramadol Hcl) .... Take 1 Tablet By Mouth Every 6 Hours As Needed For Pain.  Current Medications (verified): 1)  Metoprolol Tartrate 25 Mg Tabs (Metoprolol Tartrate) .... Take 1 Tablet By Mouth Two Times A Day 2)  Aspir-Low 81 Mg Tbec (Aspirin) .... Take 1 Tablet By Mouth Once A Day 3)  Lopid 600 Mg Tabs (Gemfibrozil) .... Take 1 Tablet By Mouth Two Times A Day 4)  Lisinopril 5 Mg Tabs (Lisinopril) .... Take 1 Tablet By Mouth Once A Day 5)  Alprazolam 1 Mg Tabs (Alprazolam) .... Take A Half Tab As Needed Twice A Day and One Tablet At Bedtime 6)  Hydrocodone-Acetaminophen 5-500 Mg  Tabs (Hydrocodone-Acetaminophen) .... Take 1 Tablet By Mouth Every 6 Hours As Needed 7)  Amlodipine Besylate 2.5 Mg Tabs (Amlodipine Besylate) .... Take 1 Tablet By Mouth Once A Day  Allergies: No Known Drug Allergies  Review of Systems General:  Complains of loss of appetite; denies weight loss. ENT:  Denies decreased hearing, nasal congestion, and sinus pressure. CV:  Denies chest pain or discomfort, difficulty breathing while lying down, and swelling of feet. Resp:  Denies chest discomfort and cough. GI:  Complains of abdominal pain and diarrhea; denies bloody stools, change in bowel habits, and constipation. GU:  Denies dysuria and nocturia; Disturbed flow, hesitency.. MS:  Complains of joint pain, muscle, and cramps; R ankle and L hands  . Neuro:  Complains of headaches; denies weakness. Psych:  Complains of anxiety. Allergy:  Complains of sneezing; denies seasonal allergies.  Physical Exam  General:  alert, well-developed, and well-nourished.   Head:  normocephalic and atraumatic.   Ears:  R ear normal and L ear normal.   Nose:  no external deformity.   Mouth:  poor dentition.   Lungs:  normal respiratory effort, normal breath sounds, no crackles, and no wheezes.   Heart:  normal rate, regular rhythm, no murmur, no gallop, and no rub.   Msk:  normal ROM, no joint swelling, no joint warmth, no redness over joints, and no joint deformities.   Extremities:  No edema B. Neurologic:  alert & oriented X3 and gait normal.   Skin:  color normal and no edema.   Psych:  Oriented X3, normally interactive, good eye contact, not anxious appearing, and not depressed appearing.     Impression & Recommendations:  Problem # 1:  ANXIETY (ICD-300.00) Next visit -   sedating SSRI or TCA???  Make certain bedtime benzo.    Check CCP, CRP, RF to verify RA.  Rheum referral???  Review cards records  F/U urinary sxs  F/U three months    His updated medication list for this problem includes:    Alprazolam 1 Mg Tabs (Alprazolam) .Marland Kitchen... Take a half tab as needed twice a day and one tablet at bedtime  Problem # 2:  RHEUMATOID ARTHRITIS (ICD-714.0)  Complete Medication List: 1)  Metoprolol Tartrate 25  Mg Tabs (Metoprolol tartrate) .... Take 1 tablet by mouth two times a day 2)  Aspir-low 81 Mg Tbec (Aspirin) .... Take 1 tablet by mouth once a day 3)  Lopid 600 Mg Tabs (Gemfibrozil) .... Take 1 tablet by mouth two times a day 4)  Lisinopril 5 Mg Tabs (Lisinopril) .... Take 1 tablet by mouth once a day 5)  Alprazolam 1 Mg Tabs (Alprazolam) .... Take a half tab as needed twice a day and one tablet at bedtime 6)  Hydrocodone-acetaminophen 5-500 Mg Tabs (Hydrocodone-acetaminophen) .... Take 1 tablet by mouth every 6 hours as needed 7)   Amlodipine Besylate 2.5 Mg Tabs (Amlodipine besylate) .... Take 1 tablet by mouth once a day  Other Orders: T-Drug Screen-Urine, (single) [id number]) T-Urine Microalbumin w/creat. ratio [id number])  Patient Instructions: 1)    Process Orders Check Orders Results:     [facility]: ABN not required for this insurance Tests Sent for requisitioning (April 02, 2010 1:23 PM):     04/02/2010: [facility] -- T-Drug Screen-Urine, (single) [[id number]] (signed)     04/02/2010: [facility] -- T-Urine Microalbumin w/creat. ratio [[id number]] (signed)      Prevention & Chronic Care Immunizations   Influenza vaccine: refuses  (09/10/2007)   Influenza vaccine deferral: Refused  (08/21/2009)    Tetanus booster: Not documented    Pneumococcal vaccine: Not documented  Colorectal Screening   Hemoccult: Not documented    Colonoscopy: Not documented  Other Screening   PSA: Not documented   Smoking status: current  (04/02/2010)   Smoking cessation counseling: yes  (04/02/2010)  Lipids   Total Cholesterol: 135  (08/22/2009)   LDL: 64  (08/22/2009)   LDL Direct: Not documented   HDL: 49  (08/22/2009)   Triglycerides: 108  (08/22/2009)    SGOT (AST): 29  (08/22/2009)   SGPT (ALT): 30  (08/22/2009)   Alkaline phosphatase: 93  (08/22/2009)   Total bilirubin: 0.5  (08/22/2009)  Hypertension   Last Blood Pressure: 149 / 83  (04/02/2010)   Serum creatinine: 0.87  (08/22/2009)   Serum potassium 4.1  (08/22/2009)  Self-Management Support :   Personal Goals (by the next clinic visit) :      Personal blood pressure goal: 140/90  (08/21/2009)     Personal LDL goal: 130  (08/21/2009)    Patient will work on the following items until the next clinic visit to reach self-care goals:     Medications and monitoring: take my medicines every day, bring all of my medications to every visit  (04/02/2010)     Eating: drink diet soda or  water instead of juice or soda, eat more vegetables, use fresh or frozen vegetables, eat foods that are low in salt, eat baked foods instead of fried foods, eat fruit for snacks and desserts, limit or avoid alcohol  (04/02/2010)     Activity: take a 30 minute walk every day  (04/02/2010)    Hypertension self-management support: Resources for patients handout  (04/02/2010)    Lipid self-management support: Resources for patients handout  (04/02/2010)     Self-management comments: WALKS DAUGHTER TO AND FROM [employer].   Process Orders Check Orders Results:     [facility]: ABN not required for this insurance Tests Sent for requisitioning (April 02, 2010 1:23 PM):     04/02/2010: [facility] -- T-Drug Screen-Urine, (single) [[id number]] (signed)     04/02/2010: [facility] --  T-Urine Microalbumin w/creat. ratio [[id number]] (signed)

## 2010-11-27 NOTE — Progress Notes (Signed)
Summary: Refill/gh  Phone Note Refill Request Message from:  Patient on January 26, 2010 2:01 PM  Refills Requested: Medication #1:  HYDROCODONE-ACETAMINOPHEN 5-500 MG  TABS Take 1 tablet by mouth every 6 hours as needed   Dosage confirmed as above?Dosage Confirmed  Medication #2:  ALPRAZOLAM 1 MG TABS Take a half tab as needed twice a day and one tablet at bedtime  Method Requested: Electronic Initial call taken by: Angelina Ok RN,  January 26, 2010 2:01 PM  Follow-up for Phone Call        Rx called to pharmacy Follow-up by: Jackson Latino MD,  January 27, 2010 9:30 AM    Prescriptions: HYDROCODONE-ACETAMINOPHEN 5-500 MG  TABS (HYDROCODONE-ACETAMINOPHEN) Take 1 tablet by mouth every 6 hours as needed  #120 x 0   Entered by:   Jackson Latino MD   Authorized by:   Vassie Loll MD   Signed by:   Jackson Latino MD on 01/27/2010   Method used:   Telephoned to ...       [facility] [address] [phone]* (retail)       [address]       [place], [place]  [address]       Ph: [phone] or [phone]       Fax: [phone]   RxID:   [id number] ALPRAZOLAM 1 MG TABS (ALPRAZOLAM) Take a half tab as needed twice a day and one tablet at bedtime  #62 x 0   Entered by:   Jackson Latino MD   Authorized by:   Vassie Loll MD   Signed by:   Jackson Latino MD on 01/27/2010   Method used:   Telephoned to ...       [facility] [address] [phone]* (retail)       [address]       [place], [place]  [address]       Ph: [phone] or [phone]       Fax: [phone]   RxID:   [id number]

## 2010-11-27 NOTE — Progress Notes (Signed)
Summary: med refill/gp  Phone Note Refill Request Message from:  Patient on August 24, 2010 12:01 PM  Refills Requested: Medication #1:  ALPRAZOLAM 1 MG TABS Take a half tab as needed twice a day and one tablet at bedtime  Medication #2:  HYDROCODONE-ACETAMINOPHEN 5-500 MG  TABS Take 1 tablet by mouth every 6 hours as needed  Method Requested: Pick up at Office Initial call taken by: Chinita Pester RN,  August 24, 2010 12:01 PM  Follow-up for Phone Call        Refill approved-nurse to complete    Prescriptions: HYDROCODONE-ACETAMINOPHEN 5-500 MG  TABS (HYDROCODONE-ACETAMINOPHEN) Take 1 tablet by mouth every 6 hours as needed  #120 x 0   Entered and Authorized by:   Vassie Loll MD   Signed by:   Vassie Loll MD on 08/24/2010   Method used:   Telephoned to ...       [facility] [address] [phone]* (retail)       [address]       [place], [place]  [address]       Ph: [phone] or [phone]       Fax: [phone]   RxID:   [id number] ALPRAZOLAM 1 MG TABS (ALPRAZOLAM) Take a half tab as needed twice a day and one tablet at bedtime  #62 x 0   Entered and Authorized by:   Vassie Loll MD   Signed by:   Vassie Loll MD on 08/24/2010   Method used:   Telephoned to ...       [facility] [address] [phone]* (retail)       [address]       [place], [place]  [address]       Ph: [phone] or [phone]       Fax: [phone]   RxID:   [id number]   Appended Document: med refill/gp Above Rxs. [facility] [address]

## 2010-11-27 NOTE — Progress Notes (Signed)
Summary: refill/ hla  Phone Note Refill Request Message from:  Patient on September 25, 2010 4:08 PM  Refills Requested: Medication #1:  HYDROCODONE-ACETAMINOPHEN 5-500 MG  TABS Take 1 tablet by mouth every 6 hours as needed   Dosage confirmed as above?Dosage Confirmed   Last Refilled: 10/28  Medication #2:  ALPRAZOLAM 1 MG TABS Take a half tab as needed twice a day and one tablet at bedtime   Dosage confirmed as above?Dosage Confirmed   Last Refilled: 10/28 last visit 03/2010, has appt this week,   Initial call taken by: Marin Roberts RN,  September 25, 2010 4:08 PM  Follow-up for Phone Call        Refill approved-nurse to complete    Prescriptions: HYDROCODONE-ACETAMINOPHEN 5-500 MG  TABS (HYDROCODONE-ACETAMINOPHEN) Take 1 tablet by mouth every 6 hours as needed  #120 x 0   Entered and Authorized by:   Vassie Loll MD   Signed by:   Vassie Loll MD on 09/26/2010   Method used:   Telephoned to ...       [facility] [address] [phone]* (retail)       [address]       [place], [place]  [address]       Ph: [phone] or [phone]       Fax: [phone]   RxID:   [id number] ALPRAZOLAM 1 MG TABS (ALPRAZOLAM) Take a half tab as needed twice a day and one tablet at bedtime  #62 x 0   Entered and Authorized by:   Vassie Loll MD   Signed by:   Vassie Loll MD on 09/26/2010   Method used:   Telephoned to ...       [facility] [address] [phone]* (retail)       [address]       [place], [place]  [address]       Ph: [phone] or [phone]       Fax: [phone]   RxID:   [id number]   Appended Document: refill/ hla Rx called in

## 2010-11-27 NOTE — Consult Note (Signed)
Summary: [facility]  [facility]   Imported By: Louretta Parma 08/16/2010 14:58:41  _____________________________________________________________________  External Attachment:    Type:   Image     Comment:   External Document

## 2010-11-27 NOTE — Progress Notes (Signed)
Summary: refill/ hla  Phone Note Refill Request Message from:  Patient on November 27, 2009 5:14 PM  Refills Requested: Medication #1:  ALPRAZOLAM 1 MG TABS Take a half tab as needed twice a day and one tablet at bedtime   Last Refilled: 1/1  Medication #2:  HYDROCODONE-ACETAMINOPHEN 5-500 MG  TABS Take 1 tablet by mouth every 6 hours as needed   Last Refilled: 1/1 Initial call taken by: Marin Roberts RN,  November 27, 2009 5:14 PM    Prescriptions: HYDROCODONE-ACETAMINOPHEN 5-500 MG  TABS (HYDROCODONE-ACETAMINOPHEN) Take 1 tablet by mouth every 6 hours as needed  #120 x 0   Entered and Authorized by:   Vassie Loll MD   Signed by:   Vassie Loll MD on 11/28/2009   Method used:   Telephoned to ...       [facility] [address] [phone]* (retail)       [address]       [place], [place]  [address]       Ph: [phone] or [phone]       Fax: [phone]   RxID:   [id number] ALPRAZOLAM 1 MG TABS (ALPRAZOLAM) Take a half tab as needed twice a day and one tablet at bedtime  #62 x 0   Entered and Authorized by:   Vassie Loll MD   Signed by:   Vassie Loll MD on 11/28/2009   Method used:   Telephoned to ...       [facility] [address] [phone]* (retail)       [address]       [place], [place]  [address]       Ph: [phone] or [phone]       Fax: [phone]   RxID:   [id number]   Appended Document: refill/ hla Above Rxs. [facility]

## 2010-11-27 NOTE — Progress Notes (Signed)
Summary: med refill/gp  Phone Note Refill Request Message from:  Fax from Pharmacy on December 11, 2009 2:45 PM  Refills Requested: Medication #1:  PLAQUENIL 200 MG TABS Take 1 tablet by mouth once a day   Last Refilled: 08/21/2009  Method Requested: Electronic Initial call taken by: Chinita Pester RN,  December 11, 2009 2:45 PM  Follow-up for Phone Call        Refill approved-nurse to complete    Prescriptions: PLAQUENIL 200 MG TABS (HYDROXYCHLOROQUINE SULFATE) Take 1 tablet by mouth once a day  #31 x 5   Entered and Authorized by:   Vassie Loll MD   Signed by:   Vassie Loll MD on 12/11/2009   Method used:   Electronically to        [facility] [address] [phone]* (retail)       [address]       [place], [place]  [address]       Ph: [phone] or [phone]       Fax: [phone]   RxID:   [id number]

## 2010-11-27 NOTE — Progress Notes (Signed)
Summary: refill/gg  Phone Note Refill Request  on July 26, 2010 12:12 PM  Refills Requested: Medication #1:  ALPRAZOLAM 1 MG TABS Take a half tab as needed twice a day and one tablet at bedtime   Last Refilled: 06/26/2010  Medication #2:  HYDROCODONE-ACETAMINOPHEN 5-500 MG  TABS Take 1 tablet by mouth every 6 hours as needed   Last Refilled: 06/26/2010  Method Requested: Telephone to Pharmacy Initial call taken by: Merrie Roof RN,  July 26, 2010 12:12 PM  Follow-up for Phone Call        Please don't fill until tomorrow. Follow-up by: Zoila Shutter MD,  July 26, 2010 2:36 PM  Additional Follow-up for Phone Call Additional follow up Details #1::        Rx called to pharmacy Additional Follow-up by: Merrie Roof RN,  July 27, 2010 10:38 AM    Prescriptions: HYDROCODONE-ACETAMINOPHEN 5-500 MG  TABS (HYDROCODONE-ACETAMINOPHEN) Take 1 tablet by mouth every 6 hours as needed  #120 x 0   Entered by:   Zoila Shutter MD   Authorized by:   Vassie Loll MD   Signed by:   Zoila Shutter MD on 07/26/2010   Method used:   Telephoned to ...       [facility] [address] [phone]* (retail)       [address]       [place], [place]  [address]       Ph: [phone] or [phone]       Fax: [phone]   RxID:   [id number] ALPRAZOLAM 1 MG TABS (ALPRAZOLAM) Take a half tab as needed twice a day and one tablet at bedtime  #62 x 0   Entered by:   Zoila Shutter MD   Authorized by:   Vassie Loll MD   Signed by:   Zoila Shutter MD on 07/26/2010   Method used:   Telephoned to ...       [facility] [address] [phone]* (retail)       [address]       [place], [place]  [address]       Ph: [phone] or [phone]       Fax: [phone]   RxID:   [id number]

## 2010-11-27 NOTE — Progress Notes (Signed)
Summary: Refill/gh  Phone Note Refill Request Message from:  Fax from Pharmacy on December 26, 2009 10:45 AM  Refills Requested: Medication #1:  HYDROCODONE-ACETAMINOPHEN 5-500 MG  TABS Take 1 tablet by mouth every 6 hours as needed   Last Refilled: 11/28/2009  Method Requested: Electronic Initial call taken by: Angelina Ok RN,  December 26, 2009 10:45 AM  Follow-up for Phone Call        Refill approved-nurse to complete    Prescriptions: HYDROCODONE-ACETAMINOPHEN 5-500 MG  TABS (HYDROCODONE-ACETAMINOPHEN) Take 1 tablet by mouth every 6 hours as needed  #120 x 0   Entered and Authorized by:   Vassie Loll MD   Signed by:   Vassie Loll MD on 12/26/2009   Method used:   Telephoned to ...       [facility] [address] [phone]* (retail)       [address]       [place], [place]  [address]       Ph: [phone] or [phone]       Fax: [phone]   RxID:   [id number]   Appended Document: Refill/gh Prescription for Vicodin 5/500 mg  # 120 with no refills [facility] [address] per order of Dr. Gwenlyn Perking.  Angelina Ok, RN December 27, 2009.

## 2010-11-27 NOTE — Progress Notes (Signed)
Summary: med refill/gp  Phone Note Refill Request Message from:  Patient on May 22, 2010 4:27 PM  Refills Requested: Medication #1:  ALPRAZOLAM 1 MG TABS Take a half tab as needed twice a day and one tablet at bedtime  Medication #2:  HYDROCODONE-ACETAMINOPHEN 5-500 MG  TABS Take 1 tablet by mouth every 6 hours as needed Last appt. June 6.   Method Requested: Telephone to Pharmacy Initial call taken by: Chinita Pester RN,  May 22, 2010 4:27 PM  Follow-up for Phone Call        Prescription approved for him at the end of the month. no Before that time.    Prescriptions: HYDROCODONE-ACETAMINOPHEN 5-500 MG  TABS (HYDROCODONE-ACETAMINOPHEN) Take 1 tablet by mouth every 6 hours as needed  #120 x 0   Entered and Authorized by:   Vassie Loll MD   Signed by:   Vassie Loll MD on 05/22/2010   Method used:   [facility]phoned to ...       [facility] [address] [phone]* (retail)       [address]       [place], [place]  [address]       Ph: [phone] or [phone]       Fax: [phone]   RxID:   [id number] ALPRAZOLAM 1 MG TABS (ALPRAZOLAM) Take a half tab as needed twice a day and one tablet at bedtime  #62 x 0   Entered and Authorized by:   Vassie Loll MD   Signed by:   Vassie Loll MD on 05/22/2010   Method used:   Telephoned to ...       [facility] [address] [phone]* (retail)       [address]       [place], [place]  [address]       Ph: [phone] or [phone]       Fax: [phone]   RxID:   [id number]   Appended Document: med refill/gp Above Rxs.called [facility]

## 2010-11-27 NOTE — Progress Notes (Signed)
Summary: refill/ hla  Phone Note Refill Request Message from:  Patient on February 22, 2010 3:39 PM  Refills Requested: Medication #1:  ALPRAZOLAM 1 MG TABS Take a half tab as needed twice a day and one tablet at bedtime   Dosage confirmed as above?Dosage Confirmed   Last Refilled: 4/2  Medication #2:  HYDROCODONE-ACETAMINOPHEN 5-500 MG  TABS Take 1 tablet by mouth every 6 hours as needed   Dosage confirmed as above?Dosage Confirmed   Last Refilled: 4/2 pt can be called at [phone], states he is going out of town and needs meds early  Initial call taken by: Marin Roberts RN,  February 22, 2010 3:39 PM  Follow-up for Phone Call        Refill approved-nurse to complete    Prescriptions: HYDROCODONE-ACETAMINOPHEN 5-500 MG  TABS (HYDROCODONE-ACETAMINOPHEN) Take 1 tablet by mouth every 6 hours as needed  #120 x 0   Entered and Authorized by:   Vassie Loll MD   Signed by:   Vassie Loll MD on 02/22/2010   Method used:   Telephoned to ...       [facility] [address] [phone]* (retail)       [address]       [place], [place]  [address]       Ph: [phone] or [phone]       Fax: [phone]   RxID:   [id number] ALPRAZOLAM 1 MG TABS (ALPRAZOLAM) Take a half tab as needed twice a day and one tablet at bedtime  #62 x 0   Entered and Authorized by:   Vassie Loll MD   Signed by:   Vassie Loll MD on 02/22/2010   Method used:   Telephoned to ...       [facility] [address] [phone]* (retail)       [address]       [place], [place]  [address]       Ph: [phone] or [phone]       Fax: [phone]   RxID:   [id number]   Appended Document: refill/ hla Refills for Alprazolam 1 mg tablets and Hydrocodone-Acetaminophen [facility] [address]. Angelina Ok, RN February 23, 2010. 9:48 AM

## 2010-11-27 NOTE — Progress Notes (Signed)
Summary: refill/ hla  Phone Note Refill Request Message from:  Patient on June 19, 2010 10:09 AM  Refills Requested: Medication #1:  ALPRAZOLAM 1 MG TABS Take a half tab as needed twice a day and one tablet at bedtime   Dosage confirmed as above?Dosage Confirmed   Last Refilled: 7/26  Medication #2:  HYDROCODONE-ACETAMINOPHEN 5-500 MG  TABS Take 1 tablet by mouth every 6 hours as needed   Dosage confirmed as above?Dosage Confirmed   Last Refilled: 7/26 last visit 6/20011  Initial call taken by: Marin Roberts RN,  June 19, 2010 10:09 AM  Follow-up for Phone Call        Patient with a signed contract for those medications; which unable him to received earlier refills. He is due on 9/1 - 9/2.  I will keep an eye on his refills for wenesday next week.  Thanks!!!!     Appended Document: refill/ hla Pt. called about his refills; pt. instructed to call back next since refills too early per Dr. Gwenlyn Perking.

## 2010-11-27 NOTE — Progress Notes (Signed)
Summary: med refill/gp  Phone Note Refill Request Message from:  Fax from Pharmacy on Mar 27, 2010 10:56 AM  Refills Requested: Medication #1:  ALPRAZOLAM 1 MG TABS Take a half tab as needed twice a day and one tablet at bedtime   Last Refilled: 02/23/2010  Medication #2:  HYDROCODONE-ACETAMINOPHEN 5-500 MG  TABS Take 1 tablet by mouth every 6 hours as needed   Last Refilled: 02/23/2010  Method Requested: Telephone to Pharmacy Initial call taken by: Chinita Pester RN,  Mar 27, 2010 10:56 AM  Follow-up for Phone Call        Refill approved-nurse to complete    Prescriptions: HYDROCODONE-ACETAMINOPHEN 5-500 MG  TABS (HYDROCODONE-ACETAMINOPHEN) Take 1 tablet by mouth every 6 hours as needed  #120 x 0   Entered and Authorized by:   Vassie Loll MD   Signed by:   Vassie Loll MD on 03/27/2010   Method used:   Telephoned to ...       [facility] [address] [phone]* (retail)       [address]       [place], [place]  [address]       Ph: [phone] or [phone]       Fax: [phone]   RxID:   [id number] ALPRAZOLAM 1 MG TABS (ALPRAZOLAM) Take a half tab as needed twice a day and one tablet at bedtime  #62 x 0   Entered and Authorized by:   Vassie Loll MD   Signed by:   Vassie Loll MD on 03/27/2010   Method used:   Telephoned to ...       [facility] [address] [phone]* (retail)       [address]       [place], [place]  [address]       Ph: [phone] or [phone]       Fax: [phone]   RxID:   [id number]   Appended Document: [facility]

## 2010-11-29 NOTE — Progress Notes (Signed)
Summary: med refil/gp  Phone Note Refill Request   Refills Requested: Medication #1:  ALPRAZOLAM 1 MG TABS Take a half tab as needed twice a day and one tablet at bedtime  Medication #2:  HYDROCODONE-ACETAMINOPHEN 5-500 MG  TABS Take 1 tablet by mouth every 6 hours as needed Last appt. 10/17/10.   Method Requested: Telephone to Pharmacy Initial call taken by: Chinita Pester RN,  October 24, 2010 4:43 PM  Follow-up for Phone Call        Needs a urine drug screen at his next visit. Will refill medication now for one month. Also needs contract on record- will route to Dr. Allena Katz so he can address at patient's next visit. If possible go ahead and schedule him with Dr. Allena Katz for 1 month. Follow-up by: Julaine Fusi  DO,  October 25, 2010 9:03 AM  Additional Follow-up for Phone Call Additional follow up Details #1::        [facility]  [facility] to [facility] for an appt. Additional Follow-up by: Chinita Pester RN,  October 25, 2010 9:40 AM    Prescriptions: HYDROCODONE-ACETAMINOPHEN 5-500 MG  TABS (HYDROCODONE-ACETAMINOPHEN) Take 1 tablet by mouth every 6 hours as needed  #120 x 0   Entered and Authorized by:   Julaine Fusi  DO   Signed by:   Julaine Fusi  DO on 10/25/2010   Method used:   Telephoned to ...       [facility] [address] [phone]* (retail)       [address]       [place], [place]  [address]       Ph: [phone] or [phone]       Fax: [phone]   RxID:   [id number] ALPRAZOLAM 1 MG TABS (ALPRAZOLAM) Take a half tab as needed twice a day and one tablet at bedtime  #62 x 0   Entered and Authorized by:   Julaine Fusi  DO   Signed by:   Julaine Fusi  DO on 10/25/2010   Method used:   Telephoned to ...       [facility] [address] [phone]* (retail)       [address]       [place], [place]  [address]       Ph: [phone] or [phone]       Fax: [phone]   RxID:   [id number]   Appended Document: med refil/gp Talked with Dr  Phillips Odor and noted pt has pain contract from 04/02/10. Pt was just seen 12/21 and has foot surgery coming up. I cancelled appointment for 1/19 as pt does not have transportation and has a hard time getting here. On next visit will do UDS and update pain contract. Pt informed

## 2010-11-29 NOTE — Assessment & Plan Note (Signed)
Summary: REASSIGNED NEW TO DR./CFB   Vital Signs:  Patient profile:   55 year old male Height:      67 inches (170.18 cm) Weight:      158.1 pounds (71.86 kg) BMI:     24.85 Temp:     97.6 degrees F (36.44 degrees C) oral Pulse rate:   67 / minute BP sitting:   128 / 78  (left arm)  Vitals Entered By: Stanton Kidney Ditzler RN (October 17, 2010 2:39 PM) Is Patient Diabetic? No Pain Assessment Patient in pain? yes     Location: all over Intensity: 9 Type: aching Onset of pain  past 3-4 days Nutritional Status BMI of 19 -24 = normal Nutritional Status Detail appetite good  Have you ever been in a relationship where you felt threatened, hurt or afraid?denies   Does patient need assistance? Functional Status Self care Ambulation Normal Comments Problems running out of pain med and nerve med - wants samples. Discuss labs of heart doctor, Ck right foot ? growth and tingling right big toe and needs letter for [facility].   Primary Care Provider:  Vassie Loll MD   History of Present Illness: Pt is a y/o man with PMH/problems as outlined in EMR. Pt comes to the clinic today with c/o R Foot pain for  about 2 weeks now. The pain is dull and chronic 8/10 in severity. Has a wart on plantarc aspect of R foot which bothers him when he walks and gives the pain. Also he has intermittent sharp pain running from his R great toe to the heel- which lasts for few seconds. He also has same pain in his wrists and knees- due to old injuries and OA( ?RA). He wanted to have his Vicodin refeilled if possible - last refill on 09/25/10- so wasn't due. C/o some dry cough for last 2 days- but no sputum, no fever. Denies any fever,cough,chest pain,SOB, abd pain, N/V,diarrhea, urinary abn, anorexia, wt loss, headache.    Depression History:      The patient denies a depressed mood most of the day and a diminished interest in his usual daily activities.         Preventive Screening-Counseling &  Management  Alcohol-Tobacco     Alcohol type:  BEER EVERY NOW AND THEN     Smoking Status: current     Smoking Cessation Counseling: yes     Packs/Day: 6  cigs per day     Year Started: AT THE AGE OF 18     Passive Smoke Exposure: no  Caffeine-Diet-Exercise     Does Patient Exercise: yes     Type of exercise: WALKING     Times/week: 1-2  Current Medications (verified): 1)  Metoprolol Tartrate 25 Mg Tabs (Metoprolol Tartrate) .... Take 1 Tablet By Mouth Two Times A Day 2)  Aspir-Low 81 Mg Tbec (Aspirin) .... Take 1 Tablet By Mouth Once A Day 3)  Lopid 600 Mg Tabs (Gemfibrozil) .... Take 1 Tablet By Mouth Two Times A Day 4)  Lisinopril 5 Mg Tabs (Lisinopril) .... Take 1 Tablet By Mouth Once A Day 5)  Alprazolam 1 Mg Tabs (Alprazolam) .... Take A Half Tab As Needed Twice A Day and One Tablet At Bedtime 6)  Hydrocodone-Acetaminophen 5-500 Mg  Tabs (Hydrocodone-Acetaminophen) .... Take 1 Tablet By Mouth Every 6 Hours As Needed 7)  Amlodipine Besylate 2.5 Mg Tabs (Amlodipine Besylate) .... Take 1 Tablet By Mouth Once A Day  Allergies (verified): No Known Drug Allergies  Social History: Packs/Day:  6  cigs per day  Review of Systems       as per HPI  Physical Exam  Additional Exam:  Gen: Patient is in NAD, Pleasant. Eyes: PERRL, EOMI, No signs of anemia or jaundince. ENT: MMM, OP clear, No erythema, thrush or exudates. Neck: Supple, No carotid Bruits, No JVD, No thyromegaly Resp: CTA- Bilaterally, No W/C/R. CVS: S1S2 RRR, No M/R/G GI: Abdomen is soft. ND, NT, NG, NR, BS+. No organomegaly. Ext: No pedal edema, cyanosis or clubbing- has a wart on Plantar aspect of R foot. GU: No CVA tenderness. Skin: No visible rashes, scars. Lymph: No palpable lymphadenopathy. MS: Moving all 4 extremities. Neuro: A&O X3, CN II - XII are grossly intact. Motor strength is 5/5 in the all 4 extremities, Sensations intact to light touch, Gait normal, Cerebellar signs negative. Psych: Appropriate     Impression & Recommendations:  Problem # 1:  FOOT PAIN, RIGHT (ICD-729.5) Assessment New R foot plantar wart- chronic. Will refer to podiatry for the wart- as he had referrals in past which helped him. Orders: Podiatry Referral (Podiatry)  Problem # 2:  OSTEOARTHRITIS (ICD-715.90) Assessment: Unchanged Has chronic pain in wrists and knees 2/2 his old injuries. Is on Vicodin for about 2 years now. Was asking for refill- but was not due.He said he can wait till its due. As he wanted to have it refilled now and then also wanted to have his scheduled refill on early next month- explianed him that that was not possible. He sounded understanding. His updated medication list for this problem includes:    Aspir-low 81 Mg Tbec (Aspirin) .Marland Kitchen... Take 1 tablet by mouth once a day    Hydrocodone-acetaminophen 5-500 Mg Tabs (Hydrocodone-acetaminophen) .Marland Kitchen... Take 1 tablet by mouth every 6 hours as needed  Orders: Podiatry Referral (Podiatry)  Problem # 3:  Preventive Health Care (ICD-V70.0) Tdap and flu shot given.  Problem # 4:  HYPERTENSION (ICD-401.9) Assessment: Unchanged BP well controlled on home meds. Will not change them for now. His updated medication list for this problem includes:    Metoprolol Tartrate 25 Mg Tabs (Metoprolol tartrate) .Marland Kitchen... Take 1 tablet by mouth two times a day    Lisinopril 5 Mg Tabs (Lisinopril) .Marland Kitchen... Take 1 tablet by mouth once a day    Amlodipine Besylate 2.5 Mg Tabs (Amlodipine besylate) .Marland Kitchen... Take 1 tablet by mouth once a day  Complete Medication List: 1)  Metoprolol Tartrate 25 Mg Tabs (Metoprolol tartrate) .... Take 1 tablet by mouth two times a day 2)  Aspir-low 81 Mg Tbec (Aspirin) .... Take 1 tablet by mouth once a day 3)  Lopid 600 Mg Tabs (Gemfibrozil) .... Take 1 tablet by mouth two times a day 4)  Lisinopril 5 Mg Tabs (Lisinopril) .... Take 1 tablet by mouth once a day 5)  Alprazolam 1 Mg Tabs (Alprazolam) .... Take a half tab as needed twice a day  and one tablet at bedtime 6)  Hydrocodone-acetaminophen 5-500 Mg Tabs (Hydrocodone-acetaminophen) .... Take 1 tablet by mouth every 6 hours as needed 7)  Amlodipine Besylate 2.5 Mg Tabs (Amlodipine besylate) .... Take 1 tablet by mouth once a day  Other Orders: Influenza Vaccine NON MCR ([address]) Tdap => 47yrs IM ([address]) Admin 1st Vaccine ([address])  Patient Instructions: 1)  Please schedule a follow-up appointment in 4 months. 2)  You will have an Podiatrist appointment scheduled for your Foot pain. 3)  Please make sure you take all your meds regularly.  Orders Added: 1)  Influenza Vaccine NON MCR [[address]] 2)  Tdap => 20yrs IM [[address]] 3)  Admin 1st Vaccine [90471] 4)  Est. Patient Level IV [[address]782] 5)  Podiatry Referral [Podiatry]   Immunizations Administered:  Influenza Vaccine # 1:    Vaccine Type: Fluvax Non-MCR    Site: left deltoid    Mfr: GlaxoSmithKline    Dose: 0.5 ml    Route: IM    Given by: Stanton Kidney Ditzler RN    Exp. Date: 04/27/2011    Lot #: [id number]    VIS given: 05/22/10 version given October 17, 2010.  Tetanus Vaccine:    Vaccine Type: Tdap    Site: right deltoid    Mfr: GlaxoSmithKline    Dose: 0.5 ml    Route: IM    Given by: Stanton Kidney Ditzler RN    Exp. Date: 08/16/2012    Lot #: [id number]    VIS given: 09/14/08 version given October 17, 2010.  Flu Vaccine Consent Questions:    Do you have a history of severe allergic reactions to this vaccine? no    Any prior history of allergic reactions to egg and/or gelatin? no    Do you have a sensitivity to the preservative Thimersol? no    Do you have a past history of Guillan-Barre Syndrome? no    Do you currently have an acute febrile illness? no    Have you ever had a severe reaction to latex? no    Vaccine information given and explained to patient? yes   Immunizations Administered:  Influenza Vaccine # 1:    Vaccine Type: Fluvax Non-MCR    Site: left deltoid    Mfr: GlaxoSmithKline    Dose: 0.5 ml     Route: IM    Given by: Stanton Kidney Ditzler RN    Exp. Date: 04/27/2011    Lot #: [id number]    VIS given: 05/22/10 version given October 17, 2010.  Tetanus Vaccine:    Vaccine Type: Tdap    Site: right deltoid    Mfr: GlaxoSmithKline    Dose: 0.5 ml    Route: IM    Given by: Stanton Kidney Ditzler RN    Exp. Date: 08/16/2012    Lot #: [id number]    VIS given: 09/14/08 version given October 17, 2010.   Prevention & Chronic Care Immunizations   Influenza vaccine: Fluvax Non-MCR  (10/17/2010)   Influenza vaccine deferral: Refused  (08/21/2009)    Tetanus booster: 10/17/2010: Tdap    Pneumococcal vaccine: Not documented  Colorectal Screening   Hemoccult: Not documented   Hemoccult action/deferral: Ordered  (10/02/2010)    Colonoscopy: Not documented  Other Screening   PSA: 0.57  (10/03/2010)   Smoking status: current  (10/17/2010)   Smoking cessation counseling: yes  (10/17/2010)  Lipids   Total Cholesterol: 129  (10/03/2010)   LDL: 33  (10/03/2010)   LDL Direct: Not documented   HDL: 51  (10/03/2010)   Triglycerides: 227  (10/03/2010)    SGOT (AST): 65  (10/03/2010)   SGPT (ALT): 46  (10/03/2010)   Alkaline phosphatase: 96  (10/03/2010)   Total bilirubin: 0.5  (10/03/2010)    Lipid flowsheet reviewed?: Yes   Progress toward LDL goal: Unchanged  Hypertension   Last Blood Pressure: 128 / 78  (10/17/2010)   Serum creatinine: 0.84  (10/03/2010)   Serum potassium 3.9  (10/03/2010)    Hypertension flowsheet reviewed?: Yes   Progress toward BP goal: At goal  Self-Management Support :   Personal  Goals (by the next clinic visit) :      Personal blood pressure goal: 140/90  (08/21/2009)     Personal LDL goal: 130  (08/21/2009)    Patient will work on the following items until the next clinic visit to reach self-care goals:     Medications and monitoring: take my medicines every day, bring all of my medications to every visit  (10/17/2010)     Eating: eat more vegetables,  use fresh or frozen vegetables, eat fruit for snacks and desserts  (10/17/2010)     Activity: take a 30 minute walk every day, park at the far end of the parking lot  (10/17/2010)    Hypertension self-management support: Written self-care plan, Education handout, Resources for patients handout  (10/17/2010)   Hypertension self-care plan printed.   Hypertension education handout printed    Lipid self-management support: Written self-care plan, Education handout, Resources for patients handout  (10/17/2010)   Lipid self-care plan printed.   Lipid education handout printed      Resource handout printed.   Nursing Instructions: Give Flu vaccine today Give tetanus booster today

## 2010-11-29 NOTE — Progress Notes (Addendum)
Summary: refill/gg    Phone Note Refill Request  on November 22, 2010 9:41 AM  Refills Requested: Medication #1:  HYDROCODONE-ACETAMINOPHEN 5-500 MG  TABS Take 1 tablet by mouth every 6 hours as needed   Last Refilled: 10/25/2010  Medication #2:  ALPRAZOLAM 1 MG TABS Take a half tab as needed twice a day and one tablet at bedtime   Last Refilled: 10/25/2010 Have sent flag to [facility] to schedule OV to sign pain contract and  get UDS   Method Requested: Telephone to Pharmacy Initial call taken by: Merrie Roof RN,  November 22, 2010 9:41 AM  Follow-up for Phone Call        Refill approved-nurse to complete Follow-up by: Ulyess Mort MD,  November 23, 2010 5:30 PM  Additional Follow-up for Phone Call Additional follow up Details #1::        Rx called to pharmacy Additional Follow-up by: Merrie Roof RN,  November 24, 2010 10:05 AM    Prescriptions: HYDROCODONE-ACETAMINOPHEN 5-500 MG  TABS (HYDROCODONE-ACETAMINOPHEN) Take 1 tablet by mouth every 6 hours as needed  #120 x 0   Entered and Authorized by:   Ulyess Mort MD   Signed by:   Ulyess Mort MD on 11/23/2010   Method used:   [facility]phoned to ...       [facility] [address] [phone]* (retail)       [address]       [place], [place]  [address]       Ph: [phone] or [phone]       Fax: [phone]   RxID:   [id number] ALPRAZOLAM 1 MG TABS (ALPRAZOLAM) Take a half tab as needed twice a day and one tablet at bedtime  #62 x 0   Entered and Authorized by:   Ulyess Mort MD   Signed by:   Ulyess Mort MD on 11/23/2010   Method used:   Telephoned to ...       [facility] [address] [phone]* (retail)       [address]       [place], [place]  [address]       Ph: [phone] or [phone]       Fax: [phone]   RxID:   [id number]

## 2010-12-24 ENCOUNTER — Other Ambulatory Visit: Payer: Self-pay | Admitting: *Deleted

## 2010-12-24 MED ORDER — HYDROCODONE-ACETAMINOPHEN 5-500 MG PO TABS: 1.0000 | ORAL_TABLET | Freq: Four times a day (QID) | ORAL | Status: DC | PRN

## 2010-12-24 MED ORDER — ALPRAZOLAM 1 MG PO TABS: ORAL_TABLET | ORAL | Status: DC

## 2010-12-24 NOTE — Telephone Encounter (Signed)
Rx called in 

## 2010-12-24 NOTE — Telephone Encounter (Signed)
Last refill 1/27 # 120

## 2010-12-27 ENCOUNTER — Telehealth: Payer: Self-pay | Admitting: *Deleted

## 2010-12-27 NOTE — Telephone Encounter (Signed)
Call from pt requesting PCN.  [name] that he is having lower back pain that radiates to his stomach.  No fevers or problems voiding.  When asked why he felt he needed an antibiotic.  Pt replied that he had back pain before and this helped.  Pt has an upcoming appointment with dr. Royetta Crochet. Said he would wait to see Dr. Allena Katz when told that he would need to be seen before getting an Antibiotic.

## 2011-01-02 ENCOUNTER — Encounter: Payer: Self-pay | Admitting: Internal Medicine

## 2011-01-02 ENCOUNTER — Ambulatory Visit (INDEPENDENT_AMBULATORY_CARE_PROVIDER_SITE_OTHER): Payer: Medicaid Other | Admitting: Internal Medicine

## 2011-01-02 VITALS — BP 101/63 | HR 60 | Temp 97.3°F | Ht 67.75 in | Wt 165.0 lb

## 2011-01-02 DIAGNOSIS — M25571 Pain in right ankle and joints of right foot: Secondary | ICD-10-CM

## 2011-01-02 DIAGNOSIS — M549 Dorsalgia, unspecified: Secondary | ICD-10-CM | POA: Insufficient documentation

## 2011-01-02 DIAGNOSIS — S39012A Strain of muscle, fascia and tendon of lower back, initial encounter: Secondary | ICD-10-CM

## 2011-01-02 DIAGNOSIS — M25579 Pain in unspecified ankle and joints of unspecified foot: Secondary | ICD-10-CM

## 2011-01-02 DIAGNOSIS — M1990 Unspecified osteoarthritis, unspecified site: Secondary | ICD-10-CM

## 2011-01-02 MED ORDER — HYDROCODONE-ACETAMINOPHEN 5-500 MG PO TABS: 1.0000 | ORAL_TABLET | Freq: Four times a day (QID) | ORAL | Status: AC | PRN

## 2011-01-02 MED ORDER — CYCLOBENZAPRINE HCL 5 MG PO TABS: 5.0000 mg | ORAL_TABLET | Freq: Two times a day (BID) | ORAL | Status: AC | PRN

## 2011-01-02 NOTE — Patient Instructions (Signed)
Please make a f/u appointment in 4 months. Also we are giving you Flexeril for your back pain along with vicodin. And also take Ibuprofen 600mg  three times a day for 14 days regularly for your back pain. Also we will call you tomorrow with your appointment with Sports medicine clinic for evaluation of your ankle for the letter.

## 2011-01-02 NOTE — Assessment & Plan Note (Signed)
Most likely diagnosis of his current back pain from Hx and Exam. Will prescribe Flexeril for 10 days and advise to take Ibuprofen 600 mg tid for  2 weeks. Continue his Vicodin.

## 2011-01-02 NOTE — Assessment & Plan Note (Addendum)
For his R ankle pain and giving up, will refer him to sports medicine for further assessment and evaluation to help him get his letter, which he needs to show at [facility] hospital . 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 And was treated at [facility] and now they wants him to have an outside Dr. Teodoro Spray saying that his current R ankle problem is due to his old ankle Fracture.

## 2011-01-02 NOTE — Progress Notes (Signed)
  Subjective:    Patient ID: [name], male    DOB: [birth year], 55 y.o.   MRN: [id number]  HPI Mr. Weidinger has an PMH of pain in ankle, wrist and now comes today with pain in his R lower lumbar region since Christmas 2011. He first had this pain during last christmas and it went away after 3-4 days and then he started having it back in around mid-feb 2012.  Pain is deep and aching with intermitent sharp spells. 8/10, non-radiatingh, exacerbated by walking and movements.  Denies any fever, night sweats, leg weakness, loss of bowel bladder control, sensation changes in back or legs. He also c/o his R ankle pain and ankle giving away sometimes when he walks. He had an old fracture of his R ankle when he was in [employer]. He also c/o dry cough with occasional white sputum and dry throat. He gets better with sips of water frequently. Denies chest pain, SOB.    Review of Systems  Constitutional: Negative for chills, diaphoresis, activity change and appetite change.  HENT: Negative.   Eyes: Negative.   Respiratory: Positive for cough. Negative for choking, chest tightness and shortness of breath.   Cardiovascular: Negative.   Gastrointestinal: Negative.   Genitourinary: Negative.   Musculoskeletal: Positive for back pain.  Neurological: Negative.        Objective:   Physical Exam  Constitutional: He is oriented to person, place, and time. He appears well-developed and well-nourished.  HENT:  Head: Normocephalic.  Eyes: EOM are normal. Pupils are equal, round, and reactive to light.  Neck: Normal range of motion. Neck supple.  Cardiovascular: Normal rate, regular rhythm and normal heart sounds.   Pulmonary/Chest: Effort normal and breath sounds normal. No respiratory distress. He has no wheezes. He has no rales. He exhibits no tenderness.  Abdominal: Soft. Bowel sounds are normal. He exhibits no distension. There is no tenderness. There is no rebound.  Musculoskeletal: Normal range of  motion.       R lumbar paraspinal tenderness.  SLR negative  Neurological: He is alert and oriented to person, place, and time. He has normal reflexes. No cranial nerve deficit.  Skin: Skin is warm and dry.          Assessment & Plan:

## 2011-01-03 ENCOUNTER — Encounter: Payer: Self-pay | Admitting: Internal Medicine

## 2011-01-03 LAB — DRUGS OF ABUSE SCREEN W/O ALC, ROUTINE URINE
Benzodiazepines.: POSITIVE — AB
Cocaine Metabolites: NEGATIVE
Creatinine,U: 111.1 mg/dL
Opiate Screen, Urine: POSITIVE — AB
Phencyclidine (PCP): NEGATIVE
Propoxyphene: NEGATIVE

## 2011-01-04 ENCOUNTER — Encounter: Payer: Self-pay | Admitting: Internal Medicine

## 2011-01-06 LAB — BENZODIAZEPINE, QUANTITATIVE, URINE
Alprazolam (GC/LC/MS), ur confirm: NEGATIVE NG/ML
Flurazepam Metabolite: NEGATIVE NG/ML
Lorazepam: NEGATIVE NG/ML
Nordiazepam GC/MS Conf: NEGATIVE NG/ML

## 2011-01-06 LAB — OPIATE, QUANTITATIVE, URINE
Hydrocodone: 11413 NG/ML — ABNORMAL HIGH
Hydromorphone - Total: NEGATIVE NG/ML
Morphine Urine: NEGATIVE NG/ML

## 2011-01-09 ENCOUNTER — Encounter: Payer: Self-pay | Admitting: Internal Medicine

## 2011-01-10 NOTE — Telephone Encounter (Signed)
Patient was scheduled for an appointment.

## 2011-01-18 ENCOUNTER — Other Ambulatory Visit: Payer: Self-pay | Admitting: *Deleted

## 2011-01-22 MED ORDER — ALPRAZOLAM 1 MG PO TABS: ORAL_TABLET | ORAL | Status: DC

## 2011-01-22 MED ORDER — HYDROCODONE-ACETAMINOPHEN 5-500 MG PO TABS: 1.0000 | ORAL_TABLET | Freq: Four times a day (QID) | ORAL | Status: AC | PRN

## 2011-01-22 NOTE — Telephone Encounter (Signed)
Refill for Xanax 1 mg tablets and Vicodin 5/500 called to the [facility] per order of Dr. Allena Katz.  Pt was called and notified of.

## 2011-01-23 ENCOUNTER — Encounter: Payer: Self-pay | Admitting: Sports Medicine

## 2011-01-23 ENCOUNTER — Ambulatory Visit (INDEPENDENT_AMBULATORY_CARE_PROVIDER_SITE_OTHER): Payer: Medicaid Other | Admitting: Sports Medicine

## 2011-01-23 ENCOUNTER — Encounter: Payer: Self-pay | Admitting: Internal Medicine

## 2011-01-23 DIAGNOSIS — M25539 Pain in unspecified wrist: Secondary | ICD-10-CM

## 2011-01-23 DIAGNOSIS — M25532 Pain in left wrist: Secondary | ICD-10-CM

## 2011-01-23 DIAGNOSIS — M25579 Pain in unspecified ankle and joints of unspecified foot: Secondary | ICD-10-CM

## 2011-01-23 DIAGNOSIS — S39012A Strain of muscle, fascia and tendon of lower back, initial encounter: Secondary | ICD-10-CM

## 2011-01-23 DIAGNOSIS — M25571 Pain in right ankle and joints of right foot: Secondary | ICD-10-CM | POA: Insufficient documentation

## 2011-01-23 NOTE — Assessment & Plan Note (Signed)
Did not fully evaluate this today as it is not the reason for his referral by internal medicine clinic. However we at sports medicine clinic we would strongly recommend avoiding narcotics use as much as possible

## 2011-01-23 NOTE — Progress Notes (Signed)
Subjective:    Patient ID: [name], male    DOB: 1956-01-31, 55 y.o.   MRN: [id number]  HPI 55 year old male referred by the outpatient internal medicine clinic for evaluation of chronic left wrist and right ankle pain. 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 He states while cooking at one point a beam fell from above him and hit his body and subsequently caused him to fall on his outstretched left hand. He states he needed surgery to repair this and asked her to the graft from his left hip. He states since that time he has had chronic pain in the left wrist with decreased strength and difficulty carrying things. He endorses some occasional tingling on that side as well. 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 He was told by the doctors in the military that it was fractured however and was placed in a cast for a couple of months. He never had any surgery. He states since that time he has had persistent pain in the lateral aspect of the right ankle. This causes him difficulty walking and again lifting heavy things. In addition he states that it will give out on him periodically. [name] also endorses a history of a shoulder problem during his military days as well. It was point he suffered a significant injury to the left shoulder and states he had surgery for this, which he believes was a rotator cuff injury. He thinks the surgery overall did well for him, but he still occasionally has pain stiffness and some range of motion issues as well as strength issues in the left shoulder occasionally. He states that he is not on any veteran disability or pension from [employer], and that he would like to get veteran's disability because of the chronic pain in his left wrist and right ankle secondary to injuries during the  military these many years ago. He states that he was told by the [facility] that he would need an outside physician evaluation. In addition he has had some persistent low back pain for the last 3 months that is being managed by the internal medicine clinic with Flexeril and Vicodin. However chose not to address this with him today as that is not the reason for his referral, and in addition I note in his problem list in the chart a history of chronic pancreatitis and multiple substance abuse.   Review of Systems Review of systems denies fevers sweats chills, and weight loss. He does endorse chronic pain in the left wrist and right ankle. Also positive for back pain. Social history: Positive for smoking. History of drug abuse per the chart. He is currently not working.    Objective:   Physical Exam Gen. appearance: Well-appearing male slightly disheveled no distress Left wrist: He has a linear scar over the dorsal aspect of the distal forearm. Range of motion shows ulnar deviation 30, radial deviation 5, extension 60, flexion 20. He has  pain at the extremes of flexion and extension and radial deviation. No pain over the first dorsal extensor compartment. Normal thumb function at the IP and CMC joints. He has minimal snuffbox tenderness to palpation but a negative Watson's test. Neurovascularly he is intact. Right wrist: For comparison, range of motion shows ulnar deviation 30, radial  deviation 25, extension at 90, flexion 80. Right ankle: No obvious effusion, erythema, or surgical scarring. No proximal fibula tenderness, negative tib-fib compression test. No bony tenderness over her lateral malleolus, medial malleolus, base of the fifth metatarsal, or navicular, or talus. Range of motion is normal on the right ankle compared to the left. He has mild laxity with anterior drawer and talar tilt with good endpoints on the right ankle, but this is equivalent to the left ankle. On the right ankle he has mild  tenderness in the sinus tarsi/lateral gutter region. He has normal strength when asked to toe walk and heel walk. Left shoulder: he has full range of motion. He has normal scapular motion and function. Rotator cuff strength is intact with empty can, external rotation and internal rotation.       Assessment & Plan:

## 2011-01-23 NOTE — Assessment & Plan Note (Addendum)
He likely has had multiple ankle sprains in the past to both ankles as he played lots of sports growing up. However on exam today he has no overt evidence of arthritis in the right ankle. -Based on his history, we'll check 3 view of the right ankle to evaluate for evidence of arthritis. -Explained to the patient that we will give her opinion and recommendations, but he'll need to followup with his primary care provider and the [facility] regarding obtaining letter for his disability as we are unable to provide full disability exam. -Would recommend trial of nonsteroidal anti-inflammatories, tricyclic antidepressant, or other neuropathic agent such as Neurontin for chronic pain he is experiencing. Would strongly recommend reducing narcotic use as much as possible. -He will followup with his PCP at internal medicine clinic and the [place]

## 2011-01-23 NOTE — Assessment & Plan Note (Signed)
Appears to be secondary to prior scaphoid fracture requiring a grafting procedure. Because of chronic pain and limitations were found on exam today there is a strong possibility he has arthritis regarding the scaphoid bone and surrounding joints in the wrist. 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

## 2011-01-30 ENCOUNTER — Ambulatory Visit (HOSPITAL_COMMUNITY)
Admission: RE | Admit: 2011-01-30 | Discharge: 2011-01-30 | Disposition: A | Discharge status: Home / Self Care | Payer: Medicaid Other | Source: Ambulatory Visit | Attending: Sports Medicine | Admitting: Sports Medicine

## 2011-01-30 DIAGNOSIS — M25539 Pain in unspecified wrist: Secondary | ICD-10-CM | POA: Insufficient documentation

## 2011-01-30 DIAGNOSIS — M25532 Pain in left wrist: Secondary | ICD-10-CM

## 2011-01-30 DIAGNOSIS — M25571 Pain in right ankle and joints of right foot: Secondary | ICD-10-CM

## 2011-01-30 DIAGNOSIS — M25579 Pain in unspecified ankle and joints of unspecified foot: Secondary | ICD-10-CM | POA: Insufficient documentation

## 2011-02-12 LAB — COMPREHENSIVE METABOLIC PANEL
ALT: 16 U/L (ref 0–53)
ALT: 23 U/L (ref 0–53)
AST: 25 U/L (ref 0–37)
AST: 36 U/L (ref 0–37)
Albumin: 2.9 g/dL — ABNORMAL LOW (ref 3.5–5.2)
Alkaline Phosphatase: 78 U/L (ref 39–117)
Alkaline Phosphatase: 91 U/L (ref 39–117)
BUN: 7 mg/dL (ref 6–23)
CO2: 23 mEq/L (ref 19–32)
CO2: 28 mEq/L (ref 19–32)
Calcium: 8.9 mg/dL (ref 8.4–10.5)
Calcium: 9.6 mg/dL (ref 8.4–10.5)
Chloride: 98 mEq/L (ref 96–112)
Chloride: 99 mEq/L (ref 96–112)
Creatinine, Ser: 0.69 mg/dL (ref 0.4–1.5)
GFR calc Af Amer: >60 mL/min (ref >60)
GFR calc Af Amer: >60 mL/min (ref >60)
GFR calc non Af Amer: >60 mL/min (ref >60)
GFR calc non Af Amer: >60 mL/min (ref >60)
Glucose, Bld: 110 mg/dL — ABNORMAL HIGH (ref 70–99)
Glucose, Bld: 117 mg/dL — ABNORMAL HIGH (ref 70–99)
Potassium: 3.7 mEq/L (ref 3.5–5.1)
Sodium: 133 mEq/L — ABNORMAL LOW (ref 135–145)
Sodium: 134 mEq/L — ABNORMAL LOW (ref 135–145)
Total Bilirubin: 1.8 mg/dL — ABNORMAL HIGH (ref 0.3–1.2)

## 2011-02-12 LAB — RAPID URINE DRUG SCREEN, HOSP PERFORMED
Barbiturates: NOT DETECTED
Opiates: POSITIVE — AB

## 2011-02-12 LAB — CBC
HCT: 40.3 % (ref 39.0–52.0)
Hemoglobin: 18.1 g/dL — ABNORMAL HIGH (ref 13.0–17.0)
MCHC: 35.3 g/dL (ref 30.0–36.0)
MCV: 101.6 fL — ABNORMAL HIGH (ref 78.0–100.0)
MCV: 102.3 fL — ABNORMAL HIGH (ref 78.0–100.0)
Platelets: 211 10*3/uL (ref 150–400)
Platelets: 213 10*3/uL (ref 150–400)
Platelets: 224 10*3/uL (ref 150–400)
RBC: 3.94 MIL/uL — ABNORMAL LOW (ref 4.22–5.81)
RBC: 4.86 MIL/uL (ref 4.22–5.81)
RBC: 5.06 MIL/uL (ref 4.22–5.81)
WBC: 11.7 10*3/uL — ABNORMAL HIGH (ref 4.0–10.5)
WBC: 12.7 10*3/uL — ABNORMAL HIGH (ref 4.0–10.5)
WBC: 14.8 10*3/uL — ABNORMAL HIGH (ref 4.0–10.5)
WBC: 16.4 10*3/uL — ABNORMAL HIGH (ref 4.0–10.5)
WBC: 20.4 10*3/uL — ABNORMAL HIGH (ref 4.0–10.5)

## 2011-02-12 LAB — BASIC METABOLIC PANEL
BUN: 6 mg/dL (ref 6–23)
Calcium: 9.1 mg/dL (ref 8.4–10.5)
Chloride: 97 mEq/L (ref 96–112)
Creatinine, Ser: 0.71 mg/dL (ref 0.4–1.5)
Creatinine, Ser: 0.79 mg/dL (ref 0.4–1.5)
GFR calc Af Amer: >60 mL/min (ref >60)
GFR calc non Af Amer: >60 mL/min (ref >60)
GFR calc non Af Amer: >60 mL/min (ref >60)
Potassium: 3.2 mEq/L — ABNORMAL LOW (ref 3.5–5.1)

## 2011-02-12 LAB — GC/CHLAMYDIA PROBE AMP, URINE: GC Probe Amp, Urine: NEGATIVE

## 2011-02-12 LAB — LIPID PANEL
Triglycerides: 119 mg/dL (ref <150)
VLDL: 24 mg/dL (ref 0–40)

## 2011-02-12 LAB — DIFFERENTIAL
Basophils Relative: 0 % (ref 0–1)
Basophils Relative: 0 % (ref 0–1)
Eosinophils Absolute: 0 10*3/uL (ref 0.0–0.7)
Eosinophils Absolute: 0 10*3/uL (ref 0.0–0.7)
Eosinophils Relative: 0 % (ref 0–5)
Lymphs Abs: 2.8 10*3/uL (ref 0.7–4.0)
Lymphs Abs: 3 10*3/uL (ref 0.7–4.0)
Neutro Abs: 16 10*3/uL — ABNORMAL HIGH (ref 1.7–7.7)
Neutrophils Relative %: 73 % (ref 43–77)
Neutrophils Relative %: 79 % — ABNORMAL HIGH (ref 43–77)

## 2011-02-12 LAB — URINALYSIS, ROUTINE W REFLEX MICROSCOPIC
Glucose, UA: NEGATIVE mg/dL
Glucose, UA: NEGATIVE mg/dL
Ketones, ur: >80 mg/dL — AB
Leukocytes, UA: NEGATIVE
Nitrite: NEGATIVE
Protein, ur: >300 mg/dL — AB
Specific Gravity, Urine: 1.033 — ABNORMAL HIGH (ref 1.005–1.030)
pH: 6 (ref 5.0–8.0)

## 2011-02-12 LAB — HIV ANTIBODY (ROUTINE TESTING W REFLEX): HIV: NONREACTIVE

## 2011-02-12 LAB — URINE MICROSCOPIC-ADD ON

## 2011-02-12 LAB — GLUCOSE, CAPILLARY: Glucose-Capillary: 118 mg/dL — ABNORMAL HIGH (ref 70–99)

## 2011-02-12 LAB — LIPASE, BLOOD
Lipase: 285 U/L — ABNORMAL HIGH (ref 11–59)
Lipase: 69 U/L — ABNORMAL HIGH (ref 11–59)

## 2011-02-12 LAB — CK TOTAL AND CKMB (NOT AT ARMC)
CK, MB: 3.5 ng/mL (ref 0.3–4.0)
Relative Index: 1.3 (ref 0.0–2.5)
Total CK: 266 U/L — ABNORMAL HIGH (ref 7–232)

## 2011-02-12 LAB — URINE CULTURE: Colony Count: 3000

## 2011-02-12 LAB — TROPONIN I: Troponin I: 0.04 ng/mL (ref 0.00–0.06)

## 2011-02-12 LAB — BILIRUBIN, FRACTIONATED(TOT/DIR/INDIR): Indirect Bilirubin: 1 mg/dL — ABNORMAL HIGH (ref 0.3–0.9)

## 2011-02-15 ENCOUNTER — Telehealth: Payer: Self-pay | Admitting: *Deleted

## 2011-02-15 NOTE — Telephone Encounter (Signed)
Pt called and wants a letter written to [facility] hospital regarding xrays of ankle and wrist. [facility] needs new supporting evidence from outside the [place].  Please call pt at [phone] Xrays done on 01/30/11

## 2011-02-19 ENCOUNTER — Ambulatory Visit (INDEPENDENT_AMBULATORY_CARE_PROVIDER_SITE_OTHER): Payer: Medicaid Other | Admitting: Internal Medicine

## 2011-02-19 DIAGNOSIS — L299 Pruritus, unspecified: Secondary | ICD-10-CM

## 2011-02-19 DIAGNOSIS — I10 Essential (primary) hypertension: Secondary | ICD-10-CM

## 2011-02-19 DIAGNOSIS — R609 Edema, unspecified: Secondary | ICD-10-CM

## 2011-02-19 DIAGNOSIS — F411 Generalized anxiety disorder: Secondary | ICD-10-CM

## 2011-02-19 DIAGNOSIS — R600 Localized edema: Secondary | ICD-10-CM

## 2011-02-19 MED ORDER — LISINOPRIL-HYDROCHLOROTHIAZIDE 10-12.5 MG PO TABS: 1.0000 | ORAL_TABLET | Freq: Every day | ORAL | Status: DC

## 2011-02-19 MED ORDER — LORATADINE 10 MG PO TABS: 10.0000 mg | ORAL_TABLET | Freq: Every day | ORAL | Status: DC

## 2011-02-19 NOTE — Patient Instructions (Signed)
Edema Edema is an abnormal build-up of fluids in tissues. Because this is partly dependent on gravity (water flows to the lowest place), it is more common in the lower extremities (legs and thighs). It is also common in the looser tissues, like around the eyes. Painless swelling of the feet and ankles is common and increases as a person ages. It may affect both legs and may include the calves or even thighs. When squeezed, the fluid may move out of the affected area and may leave a dent for a few moments. CAUSES  Prolonged standing or sitting in one place for extended periods of time. Movement helps pump tissue fluid into the veins, and absence of movement prevents this, resulting in edema.   Varicose veins. The valves in the veins do not work as well as they should. This causes fluid to leak into the tissues.   Fluid and salt overload.   Injury, burn, or surgery to the leg, ankle, or foot, may damage veins and allow fluid to leak out.   Sunburn damages vessels. Leaky vessels allow fluid to go out into the sunburned tissues.   Allergies (from insect bites or stings, medications or chemicals) cause swelling by allowing vessels to become leaky.   Protein in the blood helps keep fluid in your vessels. Low protein, as in malnutrition, allows fluid to leak out.   Hormonal changes, including pregnancy and menstruation, cause fluid retention. This fluid may leak out of vessels and cause edema.   Medications that cause fluid retention. Examples are sex hormones, blood pressure medications, steroid treatment, or anti-depressants.   Some illnesses cause edema, especially heart failure, kidney disease, or liver disease.   Surgery that cuts veins or lymph nodes, such as surgery done for the heart or for breast cancer, may result in edema.  DIAGNOSIS Your caregiver is usually easily able to determine what is causing your swelling (edema) by simply asking what is wrong (getting a history) and examining  you (doing a physical). Sometimes x-rays, EKG (electrocardiogram or heart tracing), and blood work may be done to evaluate for underlying medical illness. TREATMENT General treatment includes:  Leg elevation (or elevation of the affected body part).   Restriction of fluid intake.   Prevention of fluid overload.   Compression of the affected body part. Compression with elastic bandages or support stockings squeezes the tissues, preventing fluid from entering and forcing it back into the blood vessels.   Diuretics (also called water pills or fluid pills) pull fluid out of your body in the form of increased urination. These are effective in reducing the swelling, but can have side effects and must be used only under your caregiver's supervision. Diuretics are appropriate only for some types of edema.  The specific treatment can be directed at any underlying causes discovered. Heart, liver, or kidney disease should be treated appropriately. HOME CARE INSTRUCTIONS  Elevate the legs (or affected body part) above the level of the heart, while lying down.   Avoid sitting or standing still for prolonged periods of time.   Avoid putting anything directly under the knees when lying down, and do not wear constricting clothing or garters on the upper legs.   Exercising the legs causes the fluid to work back into the veins and lymphatic channels. This may help the swelling go down.   The pressure applied by elastic bandages or support stockings can help reduce ankle swelling.   A low-salt diet may help reduce fluid retention and decrease the   ankle swelling.   Take any medications exactly as prescribed.  SEEK MEDICAL CARE IF:  Your edema is not responding to recommended treatments.  SEEK IMMEDIATE MEDICAL CARE IF:  You develop shortness of breath or chest pain.   You cannot breathe when you lay down; or if, while lying down, you have to get up and go to the window to get your breath.   You are  having increasing swelling without relief from treatment  You develop pain or redness in the areas that are swollen. MAKE SURE YOU:  Understand these instructions.   Will watch your condition.   Will get help right away if you are not doing well or get worse.  Document Released: 10/14/2005 Document Re-Released: 04/03/2010 [facility] Patient Information 2011 [place], [place].  Stop taking Norvasc and lisinopril. Start taking lisinopril/HCTZ combination pill. Come back if you feel worse.

## 2011-02-20 ENCOUNTER — Other Ambulatory Visit: Payer: Self-pay | Admitting: *Deleted

## 2011-02-20 DIAGNOSIS — R600 Localized edema: Secondary | ICD-10-CM | POA: Insufficient documentation

## 2011-02-20 DIAGNOSIS — L299 Pruritus, unspecified: Secondary | ICD-10-CM | POA: Insufficient documentation

## 2011-02-20 NOTE — Assessment & Plan Note (Signed)
Patient is currently on 3 blood pressure medications that his beta blocker, Norvasc and lisinopril the doses of all 3 of them are suboptimal at this time. After discussion with Dr. Reche Dixon it was felt that a diuretic should be part of the regimen of blood pressure control. We will DC Norvasc and start the patient on combination HCTZ and lisinopril.

## 2011-02-20 NOTE — Progress Notes (Signed)
  Subjective:    Patient ID: [name], male    DOB: Jun 16, 1956, 55 y.o.   MRN: [id number]  HPI  Patient is a 55 year old man with past medical history most significant for anxiety disorder. This is my first office visit with him and I have not seen this patient in the past.  Patient complains of itching all over his body especially in the upper arm and legs. Patient says the itching started about 3 days ago and has been getting worse. Patient also says that itching is associated with rash is all over her body. There is no antecedent events including tick bites.  Patient is also complaining of increased swelling in his legs. Patient feels that his legs become so heavy that he is unable to work.  Patient's blood pressure is well controlled today at 129/82.  No other complaints.    Review of Systems  Constitutional: Positive for fatigue. Negative for fever, activity change and appetite change.       Itching all over the body  HENT: Negative for sore throat.   Respiratory: Negative for cough and shortness of breath.   Cardiovascular: Positive for leg swelling. Negative for chest pain.  Gastrointestinal: Negative for nausea, abdominal pain, diarrhea, constipation and abdominal distention.  Genitourinary: Negative for frequency, hematuria and difficulty urinating.  Neurological: Negative for dizziness and headaches.  Psychiatric/Behavioral: Negative for suicidal ideas and behavioral problems.       Objective:   Physical Exam  Constitutional: He is oriented to person, place, and time. He appears well-developed and well-nourished.  HENT:  Head: Normocephalic and atraumatic.  Eyes: Conjunctivae and EOM are normal. Pupils are equal, round, and reactive to light. No scleral icterus.  Neck: Normal range of motion. Neck supple. No JVD present. No thyromegaly present.  Cardiovascular: Normal rate, regular rhythm, normal heart sounds and intact distal pulses.  Exam reveals no gallop and  no friction rub.   No murmur heard. Pulmonary/Chest: Effort normal and breath sounds normal. No respiratory distress. He has no wheezes. He has no rales.  Abdominal: Soft. Bowel sounds are normal. He exhibits no distension and no mass. There is no tenderness. There is no rebound and no guarding.  Musculoskeletal: Normal range of motion. He exhibits edema. He exhibits no tenderness.       Trace edema right more than left present up to the ankles.  Lymphadenopathy:    He has no cervical adenopathy.  Neurological: He is alert and oriented to person, place, and time.  Skin: There is erythema.       Left upper arm red scratch present no other skin abnormalities  Psychiatric: He has a normal mood and affect. His behavior is normal.          Assessment & Plan:

## 2011-02-20 NOTE — Assessment & Plan Note (Signed)
Patient wants to get his anxiety medications refill. I told him to get it done through his primary care physician to avoid any duplication and confusion.

## 2011-02-26 NOTE — Telephone Encounter (Signed)
Pharmacy informed.

## 2011-02-27 ENCOUNTER — Encounter: Payer: Self-pay | Admitting: Internal Medicine

## 2011-02-27 ENCOUNTER — Ambulatory Visit (INDEPENDENT_AMBULATORY_CARE_PROVIDER_SITE_OTHER): Payer: Medicaid Other | Admitting: Internal Medicine

## 2011-02-27 DIAGNOSIS — R600 Localized edema: Secondary | ICD-10-CM

## 2011-02-27 DIAGNOSIS — R609 Edema, unspecified: Secondary | ICD-10-CM

## 2011-02-27 DIAGNOSIS — L299 Pruritus, unspecified: Secondary | ICD-10-CM

## 2011-02-27 DIAGNOSIS — M25532 Pain in left wrist: Secondary | ICD-10-CM

## 2011-02-27 DIAGNOSIS — M25539 Pain in unspecified wrist: Secondary | ICD-10-CM

## 2011-02-27 DIAGNOSIS — F411 Generalized anxiety disorder: Secondary | ICD-10-CM

## 2011-02-27 MED ORDER — ALPRAZOLAM 1 MG PO TABS: ORAL_TABLET | ORAL | Status: DC

## 2011-02-27 MED ORDER — HYDROCODONE-ACETAMINOPHEN 5-500 MG PO TABS: 1.0000 | ORAL_TABLET | Freq: Four times a day (QID) | ORAL | Status: DC | PRN

## 2011-02-27 NOTE — Assessment & Plan Note (Signed)
He was referred to sports medicine clinic during the last visit where he was evaluated by Dr. Corbin Ade, who sent me a note indicating the evaluation. According to them narcotics would not be the best choice for his pain management, but he doesn't get control of pain with any of the medication and narcotics and has been tried on different medications in the past. Considering his chronic injuries and arthritic changes, would continue Vicodin for now and reevaluate at the next visit. He also needs a letter for submission to [facility] clinic to get disability, stating his ankle and wrist changes are from the injuries he sustained during his term in [employer]. Asked him to send me the documents stating the details of the surgery from the [place] clinic. He says he will do that and once I get those papers I would write a letter.

## 2011-02-27 NOTE — Assessment & Plan Note (Signed)
Talked with him about adding an SSRI for his generalized anxiety disorder, but he attributes of Xanax being the only medication being helpful  after trying many different medications in the past. We will prescribe Xanax now and discussed with him about adding SSRI during the next visit and see his response to it and try to taper off Xanax slowly after about 6-8 weeks of starting SSRI. He is more adamant in using Xanax then trying any other medication for anxiety. Would talk with him again during the next visit and consider him about advantages of SSRI on long-term basis again.

## 2011-02-27 NOTE — Progress Notes (Signed)
  Subjective:    Patient ID: [name], male    DOB: [birth year], 55 y.o.   MRN: [id number]  HPI [name] is a 55 year old gentleman with past medical history of hypertension, hyperkalemia, osteoarthritis, right ankle and left wrist pain comes to clinic for followup visit and medicine refill. He is a week before by Dr. Eben Burow and at that time she is having new onset itching and red raised lesions over the body along with pedal edema trace. Today he has redness itching and the lesions are still there but edema is absent. He was prescribed Claritin but he never got it, is going to get today. He complains of feeling more anxiety lately because he ran out of his Xanax prescription. She complains of left wrist pain and intermittent right ankle pain which has not changed from the previous description. Denies any chest pain, shortness of breath, nausea, vomiting, headache, visual change, abdominal pain, urinary abnormalities.    Review of Systems    as per history of present illness Objective:   Physical Exam    Constitutional: Vital signs reviewed.  Patient is a well-developed and well-nourished  in no acute distress and cooperative with exam. Head: Normocephalic and atraumatic Ear: TM normal bilaterally Mouth: no erythema or exudates, MMM Eyes: PERRL, EOMI, conjunctivae normal, No scleral icterus.  Neck: Supple, Trachea midline normal ROM, No JVD, mass, thyromegaly, or carotid bruit present.  Cardiovascular: RRR, S1 normal, S2 normal, no MRG, pulses symmetric and intact bilaterally Pulmonary/Chest: CTAB, no wheezes, rales, or rhonchi Abdominal: Soft. Non-tender, non-distended, bowel sounds are normal, no masses, organomegaly, or guarding present.  Musculoskeletal: L wrist decreased ROM as similar to previous exams, surgical scar on the dorsal aspect. No significant redness, swelling of any joints in the body.  Neurological: A&O x3, Strenght is normal and symmetric bilaterally, cranial nerve  II-XII are grossly intact, no focal motor deficit, sensory intact to light touch bilaterally.  Skin: Warm, dry and intact. No rash, cyanosis, or clubbing.  Psychiatric: Normal mood and affect. speech and behavior is normal. Judgment and thought content normal. Cognition and memory are normal.       Assessment & Plan:

## 2011-02-27 NOTE — Assessment & Plan Note (Signed)
Edema resolved

## 2011-02-27 NOTE — Assessment & Plan Note (Signed)
Itching is decreased than previous visit but the rash is still there. He hasn't taken his Claritin until today and says he would go and collect it today.

## 2011-02-27 NOTE — Patient Instructions (Signed)
Clinic followup appointment in 3-4 months. Please send  documents from the [place] about the surgery.  We will consider adding SSRI medications for anxiety when you come in next time.

## 2011-03-05 NOTE — Telephone Encounter (Signed)
Pt seen in clinic on 4/24

## 2011-03-12 NOTE — Discharge Summary (Signed)
NAMEJACQUES, [name]               ACCOUNT NO.:  [id number]   MEDICAL RECORD NO.:  [id number]          PATIENT TYPE:  INP   LOCATION:  5005                         FACILITY:  [facility]   PHYSICIAN:  Madaline Guthrie, M.D.    DATE OF BIRTH:  19-Dec-1955   DATE OF ADMISSION:  03/13/2008  DATE OF DISCHARGE:  03/15/2008                               DISCHARGE SUMMARY   PRIMARY CARE PHYSICIAN:  Dellia Beckwith, MD   DISCHARGE DIAGNOSES:  1. Recurrent acute pancreatitis secondary to alcohol abuse.  2. Hypertriglyceridemia.  3. Constipation likely secondary to narcotics.  4. Anxiety disorder.  5. Hypertension.  6. Trychomoniasis.  7. Hypokalemia, resolved.  8. Liver dysfunction likely secondary to alcohol abuse.  9. History of polysubstance abuse.  10.Tobacco abuse.  11.History of posttraumatic stress disorder.  12.History of emphysema.  13.History of chest pain of noncardiac origin with Myoview in March      2008, showing inferior wall defect with an inconsistent 2-D echo      finding.  14.Macrocytosis without anemia likely related to alcohol.  15.Thrombocytopenia, borderline, likely related to alcohol.   DISCHARGE MEDICATIONS:  1. Vicodin 5/325 one to two tablets 6-hourly for pain to be used as      required.  2. Zofran 4 mg 1 tablet 8 hourly for nausea to be used as required.  3. Folic acid 1 mg daily.  4. Thiamine 100 mg daily.  5. Amlodipine 2.5 mg daily.  6. Aspirin 81 mg daily.  7. Lopid 600 mg twice daily.  8. Lisinopril 5 mg once daily.  9. Metoprolol 25 mg twice daily.  10.Zegerid 40 mg at bedtime.  11.Xanax 0.5 mg as required.  12.Protonix 40 mg once daily.   DISPOSITION AND FOLLOW UP:  The patient is sent home in a stable  condition.  The patient will follow with Dr. Lorel Monaco at [facility] on Mar 25, 2008, at 3:00 p.m.  At the  followup visit, the patient will be reviewed for resolution of his  abdominal pain.  Need for continuous pain  medications will have to be  reassessed at the followup visit.  Of note, the patient is labeled to  have chronic pancreatitis, but he does not have any findings consistent  with chronic pancreatitis on his abdominal x-ray, for example  calcification.  Also previously, it has been mentioned that the patient  used to have multiple primary care providers.  In view of the fact that  the patient continues to abuse alcohol it is desirable that the  prescription of narcotics be avoided if possible.   A repeat CBC and BMET is recommended at the followup visit.  The patient  had borderline leukocytosis and hypokalemia during his admission.  A  repeat LFT can be considered to check his liver function, which is most  likely be arranged secondary to alcohol intake. It is recommended that  patient's partner be tested and treated for possible tricomoniasis.   STUDIES AND PROCEDURES:  X-ray abdomen on Mar 12, 2008, impression  nonobstructive bowel gas pattern and stable right apical bullous  disease.  CONSULTANTS:  No special consults were requested during this hospital  admission.   ADMISSION HISTORY:  [name] is a 55 year old man who is known to have  recurrent attacks of pancreatitis related to alcohol abuse.  He is also  known to have hypertriglyceridemia.  He presented to ED with 1-week  history of abdominal pain, nausea, and vomiting.  His pain had started  after he had had a couple of beers on mother's day after remaining  abstinent for sometime.  He described his pain as severe, sharp  periumbilical pain.  It was nonradiating, aggravated by meals and  associated with nausea and vomiting.  He vomited only once before coming  into the hospital.  His pain was intermittent off and on subsiding on  its own after all variable period of time.  There was no relief with a  regular dose of Vicodin he took.  He denied any change in bowel habit,  although he had not had a bowel movement in days  because he had not had  much to eat.  The patient was in the ED for the same problem 1 day prior  to admission.  At that time, he had been found to have an elevated  lipase, but he refused admission because he had to go home to look after  his daughter.   ADMISSION PHYSICAL:  VITAL SIGNS: Temperature 97.7, blood pressure  143/89, pulse 103, respiratory rate 20, and oxygen saturation 97% on  room air. GENERAL EXAM:  Not in any apparent distress.  EYES: Muddy sclerae.  PERRLA, EOMI.  ENT: Clear oropharynx.  NECK: Supple.  CHEST: Clear to auscultation bilaterally with good air movement.  CARDIOVASCULAR: Regular rate and rhythm with no murmur, rales, or  gallops.  ABDOMEN: Soft, tender to percussion in periumbilical area.  No guarding.  No rebound.  Normal bowel sounds.  Extremities: No edema.  SKIN: Moist.  MUSCULOSKELETAL: No joint or spine tenderness or swelling.  NEUROLOGIC: Alert and oriented x3, nonfocal.  PSYCHIATRY: Examination appropriate.   ADMISSION LABS:  Lipase 272, lipase 697 one day prior.  Sodium 134,  potassium 3.2, chloride 98, bicarbonate 26, BUN 3, creatinine 0.77,  blood glucose 117, bilirubin  2.0, alk phos 59, AST 69, ALT 51, protein  7.3, albumin 3.3, and calcium 9.2.  Hemoglobin 15.1, MCV 101.3, WBC  11.1, ANC 54%, and platelet 117.  Blood alcohol level less than 5.  UDS  positive for opiate and benzo.  Urinalysis positive for Trichomonas.   HOSPITAL COURSE:  1. Recurrent acute pancreatitis.  This was most likely related to      alcohol abuse as it recurred following consumption of heavy amount      of alcohol.  The patient was admitted for observation and further      workup on his pain abdomen.  He was kept n.p.o., given IV fluids,      antiemetics, and analgesics.  After overnight meal by mouth, the      patient felt remarkably well and was able to tolerate food and      medicines by mouth from day 2 of admission.  The patient initially      required high  dose of intravenous Dilaudid, but his pain was well      controlled on p.o. Vicodin from day 2.  The patient was also      counseled on the need to remain off alcohol and to keep the use of      pain medications  to minimum.  2. Urinary tract infection/Trichomoniasis.  The patient was treated      with single dose of 2 grams Flagyl.  It is recommended that the      patient's partner be treated too for the condition.  3. Hypokalemia.  This corrected upon repletion.  This was most likely      secondary to vomiting.  4. Liver dysfunction and elevated bilirubin of 2 with AST 69 and ALT      51 and albumin 3.3 was noted.  This was most likely related to      alcohol abuse.  5. Constipation.  This was most likely secondary to narcotics abuse.      The patient was given p.r.n. stool softeners.  6. Anxiety.  The patient's Xanax was continued p.r.n.  7. Hypertension.  The patient's blood pressure medications was placed      on hold initially and restarted upon discharge.  8. Hypertriglyceridemia.  The patient's Lopid is restarted upon      discharge.   DISCHARGE DAY LABS:  Sodium 136, potassium 4.1, chloride 99, bicarbonate  29, glucose 128, BUN 2, creatinine 0.75, and calcium 9.4.  Hemoglobin  12.6, WBC 7.4, platelet 112, and MCV 102.   DISCHARGE DAY VITALS:  Temperature 97, pulse 57, respirations 20, blood  pressure 178/83, and oxygen saturation 99% on room air.   The patient's condition at the time of discharge was stable.  He had  tolerated per oral medications as well as soft-solid diet well.  He was  no longer complaining of any abdominal pain. It is recommended that the  patient's partner be also tested and treated for STDs on outpatient  basis.      Zara Council, MD  Electronically Signed      Madaline Guthrie, M.D.  Electronically Signed    AS/MEDQ  D:  03/16/2008  T:  03/17/2008  Job:  [id number]   cc:   Dellia Beckwith, M.D.

## 2011-03-12 NOTE — Discharge Summary (Signed)
[name], [name]               ACCOUNT NO.:  [id number]   MEDICAL RECORD NO.:  [id number]          PATIENT TYPE:  INP   LOCATION:  4733                         FACILITY:  [facility]   PHYSICIAN:  C. Ulyess Mort, M.D.DATE OF BIRTH:  1956/03/16   DATE OF ADMISSION:  12/16/2008  DATE OF DISCHARGE:  12/19/2008                               DISCHARGE SUMMARY   DISCHARGE DIAGNOSIS:  Acute pancreatitis.  UTI  Hypokalemia   OTHER PROBLEMS:  1. Intermittent alcoholism.  2. Hyperlipidemia.  3. Osteoarthritis.  4. Gastroesophageal reflux disease.  5. Allergic rhinitis.  6. Possible posttraumatic stress disorder.  7. History of multiple sexually transmitted disease.  8. History of left wrist and right knee trauma, not otherwise      specified.   DISCHARGE MEDICATIONS:  The patient will continue on his home regimen as  in the outpatient clinic note, which includes;  1. Metoprolol 25 mg twice daily.  2. Aspirin 81 mg daily.  3. Lopid 600 mg twice daily.  4. Xanax 0.5 mg twice daily as needed for anxiety.  5. Amlodipine 25 mg daily.  6. Protonix 40 mg daily.  7. Flonase 50 mcg 2 sprays daily.  8. Folic acid 1 mg daily.  9. Cimetidine 800 mg twice daily.  10.Mupirocin 2% ointment as needed.  11.Vicodin 5/500 one to two tablets every 6 hours as needed for pain.  12.Ciprofloxacin 500 mg twice daily for 5 days making a total of 7      days.  13.Potassium chloride 20 mEq twice daily for 3 days.  14.Also note that the patient is on pain contract at outpatient      clinic.  The patient requested refills for both the Xanax and      Vicodin but in the outpatient clinic note, the patient had both of      these prescriptions filled on the December 05, 2008, so no      prescription were written.  There was a prescription written for      ciprofloxacin and potassium chloride.  The patient was also advised      to stop taking isosorbide dinitrate, this is in the patient's      outpatient  medicine record; however, he states he does not take it,      so he was advised to stop refilling it and we will remove it from      his med list.   DISPOSITION AND FOLLOWUP:  The patient will follow up with Dr. Shary Key at Outpatient Clinic here at [facility], phone number [phone].  The appointment is for December 29, 2008, at 3 p.m.  This appointment  will be for hosptial followup to follow the patient's hypokalemia in  hospital and to follow the patient's abdominal pain and to follow the  progress of the patient's alcohol abuse, as the patient states he will  not be drinking from this point.   PROCEDURES PERFORMED:  The patient did get a chest x-ray on December 16, 2008, which showed no acute disease and some low lung volumes with  segmental  atelectasis bilaterally with some scarring.  No acute process.   CONSULTATIONS:  There are no consultations for this patient.   BRIEF ADMITTING HISTORY AND PHYSICAL:  [name] is a 55 year old  gentleman with several past admissions for acute pancreatitis which have  always been secondary to alcohol abuse who presents to the emergency  department with severe epigastric pain.  The patient was seen in our  emergency department 2 days prior so that was on the December 14, 2008,  with the same pain and was diagnosed with acute pancreatitis.  He was  given some pain medications, his lipase is 259 and he was at home on  pain medications and on a liquid diet.  The patient relates on day of  admission that the pain continued despite decreased p.o. intake and the  patient presents for pain management.  The patient relates that he had  been successful in his alcohol cessation for several years, which is not  consistent with his multiple dates and multiple admissions for acute  pancreatitis and with a positive alcohol level.  Nonetheless, the  patient relates that he has been successful with his alcohol cessation  for several years, but on the  Friday so 1 week prior to admission, the  patient attended the party where he drank over 13 beers and some and  some liquor.  Denies any other recreational drug use.  He states that  his abdominal pain started after this binge on Sunday.  The patient's  pain got progressively worse.  He is unable to tolerate any of his  medications for his high blood pressure and anxiety and is also unable  to take in any food or liquids.  The patient endorsed some nausea, some  bilious vomiting, no chest pain, abdominal pain, decreased p.o. intake,  and decreased urine output.   ALLERGIES:  The patient has no allergies.   PAST MEDICAL HISTORY:  Significant for chronic pancreatitis,  hyperlipidemia, alcohol abuse, osteoarthritis, GERD, allergic rhinitis,  posttraumatic stress disorder, history of Trichomonas and other STDs and  trauma to his left wrist and right knee, which he says occurred while he  was in combat in [place] , however, the patient is only 55 years old, so  this seems unlikely.  The patient's meds are exactly as listed on his  discharge diagnoses.  I discussed the patient's alcohol intake.  He was  a heavy cocaine user and stopped apparently 4 years ago.  He is disabled  due to his chronic pancreatitis and he is on Medicare.   SOCIAL HISTORY:  He lives with his daughter and girl friend.   FAMILY HISTORY:  Noncontributory.   REVIEW OF SYSTEMS:  As per HPI.   PHYSICAL EXAMINATION:  VITAL SIGNS:  Temperature 97, blood pressure  174/109, pulse 120, respiratory rate 18, and O2 sats 98% on room air.  GENERAL:  The patient is in moderate distress.  HEENT:  His eyes had mild icterus.  Pupils were equal, round and  reactive to light and his extraocular movements were intact.  ENT exam,  his oropharynx is dry.  Lips were scaled and cracked.  No erythema or  exudate.  NECK:  Supple.  There was no JVD.  RESPIRATORY:  Clear to auscultation bilaterally.  No wheezes or rhonchi.  CARDIOVASCULAR:   Regular rate and rhythm.  No murmurs, rubs, or gallops.  ABDOMEN:  Diffusely tender and was more severe in the epigastric region.  The patient reported severe pain with any minimal  palpation in the  epigastric region.  He did not have any rebound or distention.  He did  have hyperactive bowel sounds.  EXTREMITIES:  Free of cyanosis, clubbing or edema.  SKIN:  Notable for some rash on his right shoulder.  NEUROLOGIC:  Alert and oriented x4.  PSYCHIATRIC:  Appropriate.   ADMISSION LABORATORY DATA:  Sodium 133, potassium 3.3, chloride 95,  bicarb 28, BUN 7, creatinine 0.7, and glucose 117.  White blood cell  count 20.4 with an ANC of 16, hematocrit and hemoglobin 17.4 and 48.6  with a MCV of 100, and platelets 211.  Bilirubin was 1.8, alkaline  phosphatase was 78, AST 31, ALT 16, protein 8.4, albumin 3.7, calcium  9.6, and lipase was 69; however, on December 14, 2008, two days prior to  admission was 285.  His urine drug screen was positive for benzos and  opioids, but not cocaine.  His creatinine kinase was 266, which is  slightly elevated; however, his CK-MB was 3.5, which is normal and  troponin was 0.04, which is normal.  His urinalysis was significant for  moderate bilirubin, some ketones, some nitrites, large protein and large  urobilinogen, small leukocytes, large amount of blood with a small  amount of RBCs.   HOSPITAL COURSE BY PROBLEM:  1. Acute pancreatitis.  We suspect the acute pancreatitis in this      patient given his recent history of heavy alcohol intake and long-      standing history of acute pancreatitis  was caused by alcohol      abuse.  The patient failed outpatient therapy and therefore was      deemed to require inpatient admission for fluid resuscitation.  He      was kept n.p.o. including medications.  For pain, he was given      Dilaudid 2 every 3 hours as needed for pain.  For nausea, the      patient was given Zofran every 4 hours.  A fasting lipid panel  was      obtained, which was relatively normal just to evaluate this cause      of pancreatitis.  During his stay, the patient was transitioned to      oral pain meds and to regular diet.  On the day of discharge, the      patient was tolerating oral pain meds, a regular diet and was      endorsing just some mild abdominal pain that is chronic.  2. Hypertension.  The patient was hypertensive on arrival to the ED.      This was presumed to be due to the patient's lack of being able to      take his medications for several days due to nausea.  The patient      was started on some metoprolol once his urine drug screen came back      negative for cocaine and was also given hydralazine as needed.  As      outpatient, the patient will follow the outpatient regimen now that      he can tolerate p.o. medications.  3. Alcohol abuse.  The patient endorses desire to stop alcohol did not      want a social work consult for this.  4. Anxiety.  The patient was continued on Ativan.  He normally takes      Xanax p.o.  He was given Ativan IV 0.5 three times a day as needed      for  anxiety.  5. Hyperlipidemia.  The patient will continue to take Zocor.  The      patient was given SCDs for DVT prophylaxis.  6. Hypokalemia.  The patient was slightly hypokalemic during his stay      here and was discharged with the potassium of 3.2 on oral potassium      replacement.   DISCHARGE LABS AND VITALS:  Vitals for the day of discharge, temperature  98.5, blood pressure 170/90, pulse 65, respirations 20, and oxygen  saturation 97% on room air.  Labs, sodium 133, potassium 3.2, chloride  97, bicarb 28, BUN 3, creatinine 0.79, glucose 101, and calcium 9.2.  White blood cell count 11.7, which is down from  admitting.  White blood cell count 16.4, hemoglobin and hematocrit 14.3  and 40.3 with a MCV of 102.3, and platelets 251.   Dictation by Sarita Bottom, MS IV      Joaquin Courts, MD  Electronically  Signed      C. Ulyess Mort, M.D.  Electronically Signed    VW/MEDQ  D:  12/19/2008  T:  12/20/2008  Job:  [id number]   cc:   Rosanna Randy, MD

## 2011-03-15 NOTE — Group Therapy Note (Signed)
REASON FOR EVALUATION:  Evaluate and treat chronic arthritic pain.   HISTORY OF PRESENT ILLNESS:  [name] is a 55 year old adult male,  referred to this office by Dr. Shana Chute, his primary care physician, for  treatment of chronic joint pain.   Minimal medical records accompany the patient.  Apparently, he had undergone  rheumatoid arthritis panel back in January 2005, which became elevated at  174.  He also had an elevated antinuclear antibody study at 1:40.  Films  were done at the same time in January 2005 of his left wrist, left shoulder,  right shoulder and right ankle.  The findings were as follows.  The left  wrist showed scaphoid resection with carpal arthrodesis secondary  degenerative changes with no active fracture.  Left shoulder film showed  mild acromioclavicular degenerative changes as did the right shoulder film.  The right ankle film showed mild tibiotalar degenerative changes.   The patient reports that he was referred to Dr. Kellie Simmering, a local  rheumatologist.  Unfortunately, the patient missed three appointments and  they subsequently cancelled all appointments and they have told him that he  could not come to that office for an evaluation.   In spite of the inability to come into the rheumatologist, Dr. Shana Chute has  been treating him for rheumatoid arthritis.  Apparently, the patient reports  that he was started on Vicodin Extra Strength approximately January 2005.  He generally reports taking 3-4 tablets per day and gets good relief.  He is  able to get up and move about using the medication.  He is out of the  medication at this point as Dr. Shana Chute did not fell comfortable filling  that until the patient was seen here.  The patient complains of popping of  his ankles along with popping and pain of his left hand, left knee and  bilateral shoulder, especially on the right.   The patient does admit to occasional marijuana use.   PAST MEDICAL HISTORY:  1.   Anxiety.  2.  Tobacco abuse.   ALLERGIES:  ASPIRIN INTOLERANCE.   SOCIAL HISTORY:  The patient smokes 1 to 1-1/2 packs of cigarettes per day.  He reports rare alcohol intake and admits that he does use marijuana on a  periodic basis.  He lives with his girlfriend and his small child at the  present time.  He has been on disability since approximately 1998.   MEDICATIONS:  Xanax 1 mg t.i.d. p.r.n.   REVIEW OF SYSTEMS:  Positive for respiratory infections, coughing, wheezing,  numbness, hand and finger pain, poor sleep, anxiety.   PHYSICAL EXAMINATION:  GENERAL APPEARANCE:  Reasonably well appearing adult  male in mild to moderate acute discomfort.  VITAL SIGNS:  Blood pressure 135/71 with pulse 71, respirations 16, O2  saturation 96% on room air.  Patient's height was 5 feet 7-1/4 inches with  weight of 139 pounds.  He does have a slight cold in the office today but is  pleasant and cooperative.  EXTREMITIES:  The patient is able to toe-walk and heel-walk but complains of  bilateral foot pain and ankle pain with this maneuver.  He does not use any  assisted device during ambulation.  Upper extremity range of motion showed  significantly decreased range of motion of the right shoulder compared to  the left.  Right shoulder flexion and abduction was only to 90 degrees and  left shoulder flexion and abduction was to approximately 110 degrees.  The  patient had decreased  internal and external rotation of the right shoulder  compared to the left.   Upper extremity exam showed 4+/5 strength in elbow flexion, elbow extension  and grip.  These had normal sensation throughout the bilateral upper  extremities.  There was only minimal tenderness to palpation of his joints  and only minimal joint swelling was present in his hands bilaterally.  Range  of motion of his shoulders was poor even passively.   Lower extremity exam showed 4/5 strength in hip flexion, knee extension and  ankle  dorsiflexion.  Reflexes were 1+ at the bilateral knees and ankles and  sensation was intact to light touch throughout the bilateral lower  extremities.  In the spine position, straight leg raising is negative  bilaterally and hip range of motion was normal bilaterally.  In the same  position, lumbar range of motion was within functional limits with slightly  decreased lateral bending and rotation and fairly normal extension.  Examination of his lower extremity showed minimal swelling of his knees and  ankles bilaterally.   IMPRESSION:  Probable rheumatoid arthritis with multiple joint involvement.   PLAN:  At the present time, we have again tried Dr. Ines Bloomer office to see  if they will see the patient, but unfortunately, because he missed three  appointments, he has been released completely.  We will plan on continuing  some of the medication that has worked for him to date including the Extra  Strength Vicodin at 7.5/750 one tablet p.o. q.i.d. p.r.n.  I have given him  a total of 120 on a prescription pad in the office today.  We plan on seeing  him in follow up in approximately one months time to see how he is doing on  that.  I have warned him about diversion or misuse of the medication.  He  reports that he will be compliant with all medication roles so that he can  continue getting the relief that he needs from the medication as prescribed.  Will plan on seeing him in follow up in approximately one month with  adjustment in his medications as necessary in the future.      DC/MedQ  D:  12/03/2004 12:06:03  T:  12/03/2004 13:32:09  Job #:  [id number]

## 2011-03-15 NOTE — Discharge Summary (Signed)
[name], [name]               ACCOUNT NO.:  [id number]   MEDICAL RECORD NO.:  [id number]          PATIENT TYPE:  INP   LOCATION:  6524                         FACILITY:  [facility]   PHYSICIAN:  [name], M.D.       DATE OF BIRTH:  [birth year]   DATE OF ADMISSION:  12/29/2006  DATE OF DISCHARGE:  12/30/2006                               DISCHARGE SUMMARY   PRIMARY CARE PHYSICIAN:  Who is this patient's primary care physician is  debatable  - since the patient is seeing the [employer] in [place], [place] and he also goes to [facility] and he also sees a Dr. Shana [name].   DISCHARGE DIAGNOSES:  1. Chest pain, felt to be noncardiac in nature.  2. Fixed defect in the inferior wall at the Cardiolite scan.      Transthoracic echocardiogram not confirming it.  3. Alcohol abuse.  4. Hypertriglyceridemia.  5. Rheumatoid arthritis.  6. History of cocaine abuse, currently patient not using cocaine.  7. Abdominal obesity.  8. Tobacco abuse.  9. Chronic anxiety.  10.Chronic pain syndrome, secondary to rheumatoid arthritis and      osteoarthritis.  11.Emphysema.   DISCHARGE MEDICATIONS:  1. Metoprolol 25 mg twice a day.  2. Aspirin 81 mg daily.  3. Prilosec OTC 20 mg daily.  4. Lopid 600 mg twice a day.  5. Isosorbide dinitrate 5 mg 3 times a day.  6. Lisinopril 5 mg daily.  7. Xanax 0.25 mg 3 times a day.  8. Vicodin 5/500 every 6 hours as needed for pain.  9. Multivitamin daily.   CONDITION AT DISCHARGE:  Patient was discharged in good condition.  At  the time of discharge, he was instructed to follow up with Dr. Algie [name]  and follow up with [facility] as before.   PROCEDURES ON THIS ADMISSION:  1. [name] had a chest x-ray with findings of apical bolus      emphysema.  2. Transthoracic echocardiogram, results of which are pending at the      time of my dictation.  3. Persantine Myoview read by Dr. Algie [name]  with fixed inferior wall      defect.  Ejection fraction 60%.   CONSULTATION DURING THIS ADMISSION:  The patient was seen in  consultation by Dr. Algie [name] from cardiology.   HISTORY AND PHYSICAL:  For admission history and physical, refer to  dictated H&P done by Dr. Sherrie [name] December 29, 2006.   HOSPITAL COURSE:  1. Chest pain.  [name] was admitted with an episode of chest pain      at effort.  He was admitted to telemetry unit and he ruled out for      myocardial infarction.  In fact, 3 sets of cardiac enzymes were      completely within normal limits.  Urine drug screen was negative      for cocaine.  A lipase was negative.  Etiology of the patient's      chest pain is elusive, but it could be related to effort angina or  it could be related to a musculoskeletal type pain.  In any      regards, the patient was seen in consultation by Dr. Algie [name] and it      was felt that his stress test was a low risk.  Patient was started      on ACE inhibitors, nitrates, beta-blockers, aspirin and he was      advised to follow up with Dr. Algie [name] as an outpatient for a      cardiac catheterization.  2. Rheumatoid arthritis.  [name] has definite rheumatoid arthritis      based on his positive rheumatoid factor and these clinical      findings.  The patient has never seen a rheumatologist.  I have      strongly encouraged [name] to pursue a rheumatological      consultation through the [facility] system,      since that is his best option of seeing a specialist.  At this      point in time, [name] is abusing alcohol, cigarettes and he has      got an erratic lifestyle, so I do not feel he is a prime candidate      for methotrexate, unless he has some radical changes in his life.      For now, symptomatic treatment with Vicodin has been provided.  3. Alcohol abuse and hypertriglyceridemia.  [name] was counseled      extensively about the dangers of alcohol abuse  and he was started      on Lopid.      [name], M.D.  Electronically Signed     SL/MEDQ  D:  12/30/2006  T:  12/31/2006  Job:  [id number]   cc:   Ricki Rodriguez, M.D.

## 2011-03-15 NOTE — Assessment & Plan Note (Signed)
[name] returns to clinic today for follow-up evaluation.  Unfortunately,  we have continued to obtain positive drug screens on this patient.  I  reviewed all those with him in the office today.  The first drug screen was  on his day of admission into the clinic and that was December 03, 2004.  That  was positive for cocaine and marijuana.  Subsequent follow-up visit December 27, 2004, came back positive for cocaine.  Follow-up visit Mar 08, 2005, also  came back positive for cocaine.  The patient had been told during the first  evaluation that he needed to abstain and refrain from any street drug use.  He has been given numerous chances and despite that, had some positive tests  as noted.  He also had a positive test for cocaine April 17, 2005, which he  feels was in error.  He reports today that he has used cocaine recently and  the urine drug screen that we have already obtained in the office is likely  to come back positive for cocaine.   We have told the patient about the inability to prescribe medications  through this office on a repeated basis if his urine is positive for street  drugs.  He seems to understand that that continues to be a problem and he  needs to get attention for that.   MEDICATIONS:  1.  Xanax 1mg  t.i.d.  2.  Vicodin Extra Strength 7.5/750 one tablet q.i.d. p.r.n.  3.  Isoniazid 300 mg daily.  4.  Vitamin B6 25 mcg daily.   PHYSICAL EXAMINATION:  GENERAL APPEARANCE:  A reasonably well-appearing  adult male in mild acute discomfort.  VITAL SIGNS:  Blood pressure 131/84 with pulse 67, respiratory rate 16 and  O2 saturation 100% on room air.  NEUROLOGIC:  He has 5-/5 strength throughout the bilateral upper and lower  extremities.  Bulk and tone were normal.  Reflexes were 2+ and symmetrical.   IMPRESSION:  1.  Probable rheumatoid arthritis with multiple joint involvement.  2.  Chronic pain related to #1.   In the office today we did go over the extensive drug  screen that we have  done on this patient.  He has been consistently positive for cocaine despite  numerous admonishments.   In this office today, we did give him a small sample of the hydrocodone and  Xanax to avoid acute drug withdrawal.  I have given him a total of 40 but he  understands that at the present time, no further repose will be given  through this office.   We did have a discussion about how he could possibly regain admission into  the clinic.  I have told him that we need to get three consecutive drug  screens that he gives to this office while he is in the office.  The first  drug screen would have to be at the end of August and the subsequent screen  at the end of September and the end of October.  If all those drugs screens  were negative for street drugs including marijuana and cocaine then we might  consider having him reenter the clinic for pain medication.  He understands  that even then he would need to be abstinent from the street drugs and  maintain clean urine drug screen except for the prescribed medications.   With that in place, we discussed it with the nurse in the office today.  She  will be seeing him for  a urine drug screen at the end of August and then two  as noted above.  If those drug screens are negative, then we will reconsider  readmission to the clinic for pain medication administration.  He will need  to have urine drug screen to remain  negative assuming that the first three are negative.  He seems to understand  this.  If the above plan is not maintained, then no further medication will  be prescribed through this office and he has been that repeatedly in the  office today.       DC/MedQ  D:  05/27/2005 11:25:41  T:  05/27/2005 13:18:28  Job #:  [id number]

## 2011-03-15 NOTE — H&P (Signed)
[name], [name]               ACCOUNT NO.:  [id number]   MEDICAL RECORD NO.:  [id number]          PATIENT TYPE:  INP   LOCATION:  1825                         FACILITY:  [facility]   PHYSICIAN:  [name], M.D.    DATE OF BIRTH:  13-Apr-1956   DATE OF ADMISSION:  12/29/2006  DATE OF DISCHARGE:                              HISTORY & PHYSICAL   PRIMARY CARE PHYSICIAN:  Unassigned.  (Does see a physician occasionally  at the [facility].)   CHIEF COMPLAINT:  Chest pain.   HISTORY OF PRESENT ILLNESS:  The patient is a 55 year old man with a  past medical history significant for possible rheumatoid arthritis,  tobacco use, and chronic anxiety, who presents to the emergency  department with Chief Complaint of chest pain.  The patient actually  presented to [facility] this afternoon.  This morning he was  at work.  At approximately 10:30 a.m., he developed chest pain.  The  pain was located substernally and to the left.  It is described as  sharp.  It lasted for approximately 1 minute, then resolved  spontaneously.  Another episode of chest pain occurred 2 minutes later.  It lasted for approximately 30 seconds.  The patient had associated  lightheadedness but no associated radiation, pleurisy, shortness of  breath, nausea, or diaphoresis.  At the time the chest pain occurred, he  was standing.  He denies any recent heavy lifting, heartburn, or  musculoskeletal pain.  He does have a chronic cough which he attributes  to cigarette smoking. Sometimes the cough is productive with brown  sputum, and sometimes the cough is dry.  He has not had any upper  respiratory infection symptoms, fever, chills, abdominal pain, nausea,  vomiting, diarrhea, or swelling in his legs.   During the evaluation in the emergency department, the patient is noted  to be hypertensive with blood pressure of 203/101.  His EKG reveals  normal sinus rhythm with a heart rate of 66 beats per minute.  His  initial cardiac enzymes are within normal limits.  However, given the  patient's risk factors, he will be admitted for further evaluation and  management.   PAST MEDICAL HISTORY:  1. Chronic pain syndrome thought to be secondary to rheumatoid      arthritis.  The patient had been followed by Dr. Thomasena Edis; however,      he dismissed him from the practice secondary to polysubstance      abuse.  2. Tobacco abuse.  3. History of cocaine and marijuana abuse.  The patient has been      abstinent for at least one year.  4. Chronic anxiety.  5. Status post bilateral shoulder and left wrist surgery secondary to      trauma.  6. Status post right cheek surgery secondary to trauma.  7. History of pancreatitis.   MEDICATIONS:  1. Vicodin 7.5 mg every 4-6 hours p.r.n.  2. Xanax 1 mg 1 tablet 3-4 times daily p.r.n.   ALLERGIES:  The patient has no known drug allergies, although he is  intolerant to MORPHINE.   SOCIAL  HISTORY:  The patient is single.  [place]  He has three children.  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  He smokes 1/2 pack of cigarettes per day.  He drinks beer 1-2 every other day.  He denies illicit drug use at this  time.   FAMILY HISTORY:  His father died of heart failure at 69 years of age.  He had a history of two previous heart attacks.  His mother also died of  heart failure at 13 years of age.  She also had a history of one heart  attack.   REVIEW OF SYSTEMS:  The patient's Review of Systems is positive for  occasional joint pain, particularly in his hands and his knees.  Review  of Systems is also positive for chronic cough.  Review of Systems is  positive for anxiousness.  Otherwise Review of Systems is negative.   PHYSICAL EXAMINATION:  VITAL SIGNS:  Blood pressure 203/101, repeated at  187/100.  Pulse 67, respiratory rate 19, oxygen saturation 100% on 2  liters nasal cannula oxygen.  GENERAL: The  patient is a pleasant, 55 year old African-American man who  is currently lying in bed in no acute distress.  HEENT:  Head is normocephalic and atraumatic.  Pupils equal, round, and  reactive to light.  Extraocular movements intact.  Conjunctivae clear.  Sclerae white.  Tympanic membranes clear bilaterally.  Nasal mucosa is  mildly dry.  Oropharynx reveals fair dentition.  Mucous membranes are  mildly dry.  No posterior exudate or erythema.  NECK:  Supple.  No adenopathy, no thyromegaly, no bruit, no JVD.  LUNGS: Decreased breath sounds in the bases, otherwise clear.  HEART: S1, S2 with no murmurs, rubs, or gallops.  ABDOMEN: Positive bowel sounds.  Soft, nontender, nondistended.  No  hepatosplenomegaly, no masses palpated.  EXTREMITIES:  Pedal pulse on the left 2+.  Pedal pulse on the right 1+.  No pretibial edema and no pedal edema.  The patient has mild arthritic  changes seen over the MCP joints of both hands without any active  synovitis.  NEUROLOGIC: The patient is alert and oriented x3.  Cranial nerves II-XII  intact.  Strength 5/5 throughout.  Sensation is intact.   ADMISSION LABORATORY DATA:  EKG reveals normal sinus rhythm with a heart  rate of 66 beats per minute.   Chest x-ray reveals right apical bullus disease, negative for acute  changes.   CK-MB 5.4, relative index 1.4, troponin I 0.04.  WBC 8.4, hemoglobin  16.7, platelets 184.  Sodium 132, potassium 3.9, chloride 98, CO2 23,  glucose 87, BUN 9, creatinine 0.81, calcium 9.3.   ASSESSMENT:  1. Chest pain, rule out myocardial infarction.  The patient certainly      has risk factors for coronary artery disease including      hypertension, family history, tobacco abuse, and gender.  The      patient's pain does appear to be atypical.  In addition, his EKG is      within normal limits.  However, given his risk factors, he will be      admitted to rule out myocardial infarction. 2. Hypertension.  The patient gives no  prior history of a given      diagnosis of hypertension.  He is obviously hypertensive in the      emergency department currently.  3. Bullous disease per chest x-ray.  The patient is a chronic tobacco  user.  4. Chronic anxiety.  The patient is chronically  treated with as      needed with Xanax.   PLAN:  1. The patient will be admitted for further evaluation and management.  2. We will start aspirin therapy, Nitropaste, and Vicodin p.r.n. for      pain.  Will add oxygen 2 liters per minute.  3. Will start Lopressor 25 mg b.i.d.  4. Continue Xanax p.r.n. for anxiety.  5. Prophylactic  Lovenox for now.  6 . Will add prophylactic Protonix 40 mg daily.  1. For further evaluation, wll check cardiac enzymes q. 8 h. x3, TSH,      fasting lipid panel.  2. Tobacco cessation counseling.      [name], M.D.  Electronically Signed     DF/MEDQ  D:  12/29/2006  T:  12/29/2006  Job:  [id number]

## 2011-03-15 NOTE — Assessment & Plan Note (Signed)
MEDICAL RECORD NUMBER:  [id number]   DATE OF BIRTH:  May 20, 1956   DATE OF EVALUATION:  Mar 08, 2005   INTERVAL HISTORY:  [name] returns to clinic today for followup  evaluation.  I had to confront him with the last two urine drug screens.  The first one was taken when I first saw him December 03, 2004.  That came  back positive for marijuana and cocaine.  He subsequently reported that he  did smoke a joint of marijuana and reports that cocaine must have been  present on the marijuana.   When I saw the patient December 27, 2004, I got that story from the patient and  we repeat urine drug screened him.  The sample from December 27, 2004 came back  positive for opiates which were expected since I was treating him with  Vicodin.  It also came back positive for cocaine but negative for marijuana.  He now tells me that he did snort cocaine but reports that he has not used  marijuana.   I did tell the patient that we are doing a urine drug screen today.  I  warned him that I will do another drug screen whenever he comes back in 1  month.  If the urine drug screen in 1 month still remains positive for  marijuana or cocaine we will have to release him.  I anticipate and he tells  me that we will probably get a positive cocaine and marijuana on today's  drug screen.  Again, if the next urine drug screen comes back positive that  will be his third strike even after a warning and we will definitely  discharge him.  I have told him that the urine drug screen needs to  definitely show the prescribed medications such as the Xanax and the opiates  and be definitely negative for cocaine and marijuana.  He seems to have at  least a decent understanding of what we need him to be doing to remain in  this clinic as a patient.   The patient reports that he takes his Vicodin 7.5/750 four times a day and  gets good relief.  He tells me that multiple family members have passed away  recently and that his  grandmother is extremely ill.  He tells me that this  is the reason that he has used cocaine and marijuana recently.  He requests  a refill on his Xanax medication in the office today.   MEDICATIONS:  1.  Xanax 1 mg t.i.d. p.r.n.  2.  Vicodin Extra Strength 7.5/750 one tablet q.i.d. p.r.n.  3.  Isoniazid 300 mg once daily.  4.  Vitamin B6 25 mcg once daily.   PHYSICAL EXAMINATION:  Reasonably well-appearing fit adult male in mild  acute discomfort.  Blood pressure 164/99 with a pulse of 86, respiratory  rate 16 and O2 saturation 98% on room air.  Patient has 5-/5 strength  throughout the bilateral upper and lower extremities.  He ambulates without  any assistive device.  Reflexes were 2+ and symmetrical.   IMPRESSION:  Probable rheumatoid arthritis with multiple joint involvement.   In the office today, we did refill his Vicodin and Xanax medications as  noted above.  We have obtained a urine drug screen which I suspect will  probably still be positive for cocaine and/or marijuana in the office today.  We will  definitely urine drug screen him when I see him in followup in 4-5 weeks.  He understands that if that urine drug screen does not show expected results  as noted above he will definitely be discharged.      DC/MedQ  D:  03/08/2005 14:34:05  T:  03/09/2005 23:15:12  Job #:  [id number]

## 2011-03-15 NOTE — Assessment & Plan Note (Signed)
DATE OF EVALUATION:  December 27, 2004.   MEDICAL RECORD NUMBER:  [id number].   [name] returns to the clinic today for followup evaluation.  I first and  last saw the patient in this office on December 03, 2004, on referral from  his primary care physician, Dr. Shana Chute.  The patient had a history of  chronic joint pain for which he had been prescribed extra strength Vicodin.  During the initial evaluation, we did obtain a urine drug screen which  eventually came back positive for marijuana and cocaine.  The patient  reports that he had apparently obtained some marijuana that was spike with  cocaine, but he denies use of that drug explicitly.  He reports that he is  trying to discontinue the use of marijuana, but admits to using it  approximately two times per week.  We have told him that we will need to get  a urine drug screen in the office today.  He is adamant that he wants to try  to quit use of marijuana and that he definitely does not use cocaine  knowingly.   The patient reports that he does take his extra strength Vicodin  approximately four tablets per day and gets good relief.  He has a couple of  his Vicodin remaining at the present time and we plan on refilling the  prescription as of December 29, 2004.   The patient reports that he is presently being evaluated by the [facility] for positive tuberculosis in the household.  The  patient apparently is on isoniazid at the present time, but he reports some  nausea related to that medication.  There are some health workers coming out  to his home to evaluate his mother and brother, who are both in the home and  potentially may also be sick with symptoms of TB.  I have told the patient  to try to contact the health department and see what they have in terms of  other medication in place of the isoniazid for treatment of the TB exposure.  I have told him that the likelihood of another drug is low withe same  effectiveness of the isoniazid.   MEDICATIONS:  1.  Xanax 1 mg t.i.d. p.r.n.  2.  Vicodin extra strength 7.5/750 mg one tablet q.i.d. p.r.n.  3.  Isoniazid 300 mg daily.  4.  Vitamin B6 25 mcg daily.   PHYSICAL EXAMINATION:  A well-appearing, fit, adult male in no acute  discomfort.  Blood pressure 141/80 with a pulse of 71, respiratory rate 16  and O2 saturation 98% on room air.  The patient has good range of motion and  strength throughout his bilateral upper and lower extremities.  Bulk and  tone were normal.  Reflexes were 2+ and symmetrical.  He is able to ambulate  without any assistive device.   IMPRESSION:  Probable rheumatoid arthritis with multiple joint involvement.  In the office today, we did refill his hydrocodone 7.5/750 mg one tablet  p.o. q.i.d. p.r.n., a total of 120 as of December 29, 2004.  We have obtained a  urine drug screen in the office today and I have told him that we will  probably do that repeatedly over the next few months to confirm the  abstinence from marijuana use.  We also need to make sure that he is cleared  from the cocaine that according to him was inadvertant with the marijuana  use.   Will plan on  seeing the patient in followup in approximately one to two  months' time.    DC/MedQ  D:  12/27/2004 11:59:19  T:  12/27/2004 14:52:39  Job #:  [id number]

## 2011-03-15 NOTE — Assessment & Plan Note (Signed)
DATE OF EVALUATION:  April 17, 2005.   MEDICAL RECORD NUMBER:  [id number].   [name] returns to the clinic today for followup evaluation.  He reports  that he is attending substance abuse meetings and reports that he expects  his urine to be clean on drug screen in the office today.  He has been  positive for marijuana and cocaine in the past.  I have told him repeatedly  that if  he continues to be positive, I will be unable to continue  prescribing pain medicines for him.  He is taking hydrocodone 7.5/750 mg one  tablet p.o. q.i.d. p.r.n.  He does not bring his bottles into the office  today.  I have told him again that he needs to comply with those  regulations.  He also takes Xanax 1 mg t.i.d. p.r.n.  He has a need for  refill on both of those medications in the office today.   MEDICATIONS:  1.  Xanax 1 mg t.i.d. p.r.n.  2.  Vicodin Extra Strength 7.5/750 mg one tablet q.i.d. p.r.n.  3.  Isoniazid 300 mg daily.  4.  Vitamin B6 25 mcg daily.   PHYSICAL EXAMINATION:  GENERAL APPEARANCE:  A reasonably, well-appearing,  fit, adult male in mild acute discomfort.  VITAL SIGNS:  Blood pressure 149/73 with a pulse of 75, respiratory rate 16  and O2 saturation 97% on room air.  EXTREMITIES:  He has 5-/5 strength throughout the bilateral upper and lower  extremities.  Bulk and tone were normal.  Reflexes were 2+ and symmetrical.   IMPRESSION:  Probable rheumatoid arthritis with multiple joint involvement.   In the office today, we did refill the patient's hydrocodone and Xanax as  noted above.  We plan to see the patient in followup in one month's time.  We will checked the results of the urine drug screen obtained in the office  today.       DC/MedQ  D:  04/17/2005 13:54:21  T:  04/17/2005 14:52:36  Job #:  [id number]

## 2011-03-18 ENCOUNTER — Other Ambulatory Visit: Payer: Self-pay | Admitting: *Deleted

## 2011-03-18 NOTE — Telephone Encounter (Signed)
Last refill 5/2 Will send for refill next Thursday

## 2011-03-21 ENCOUNTER — Telehealth: Payer: Self-pay | Admitting: *Deleted

## 2011-03-21 ENCOUNTER — Inpatient Hospital Stay (INDEPENDENT_AMBULATORY_CARE_PROVIDER_SITE_OTHER)
Admission: RE | Admit: 2011-03-21 | Discharge: 2011-03-21 | Disposition: A | Discharge status: Home / Self Care | Payer: Medicaid Other | Source: Ambulatory Visit | Attending: Emergency Medicine | Admitting: Emergency Medicine

## 2011-03-21 DIAGNOSIS — L509 Urticaria, unspecified: Secondary | ICD-10-CM

## 2011-03-21 NOTE — Telephone Encounter (Signed)
Call from pt states he is breaking out in whets. The breakout comes and goes all his body. Tooth ache pain as well.  Wants to come in for an appointment today.  No available appointments  Pt was advised to go to Urgent Care or the ER for assessment of the  wealts that are  coming and going on his skin.  York Spaniel that it was very irritating.

## 2011-03-21 NOTE — Telephone Encounter (Signed)
OK 

## 2011-03-24 ENCOUNTER — Encounter: Payer: Self-pay | Admitting: Internal Medicine

## 2011-03-25 ENCOUNTER — Encounter: Payer: Self-pay | Admitting: Internal Medicine

## 2011-03-26 ENCOUNTER — Other Ambulatory Visit: Payer: Self-pay | Admitting: *Deleted

## 2011-03-27 ENCOUNTER — Other Ambulatory Visit: Payer: Self-pay | Admitting: Internal Medicine

## 2011-03-27 MED ORDER — ALPRAZOLAM 1 MG PO TABS: ORAL_TABLET | ORAL | Status: DC

## 2011-03-27 MED ORDER — HYDROCODONE-ACETAMINOPHEN 5-500 MG PO TABS: 1.0000 | ORAL_TABLET | Freq: Four times a day (QID) | ORAL | Status: AC | PRN

## 2011-03-27 MED ORDER — HYDROCODONE-ACETAMINOPHEN 5-500 MG PO TABS: 1.0000 | ORAL_TABLET | Freq: Four times a day (QID) | ORAL | Status: DC | PRN

## 2011-03-28 NOTE — Telephone Encounter (Signed)
Called to pharm 

## 2011-05-03 ENCOUNTER — Encounter: Payer: Self-pay | Admitting: Internal Medicine

## 2011-05-03 ENCOUNTER — Ambulatory Visit (INDEPENDENT_AMBULATORY_CARE_PROVIDER_SITE_OTHER): Payer: Medicaid Other | Admitting: Internal Medicine

## 2011-05-03 ENCOUNTER — Ambulatory Visit (HOSPITAL_COMMUNITY)
Admission: RE | Admit: 2011-05-03 | Discharge: 2011-05-03 | Disposition: A | Discharge status: Home / Self Care | Payer: Medicaid Other | Source: Ambulatory Visit | Attending: Internal Medicine | Admitting: Internal Medicine

## 2011-05-03 VITALS — BP 169/100 | HR 100 | Temp 97.0°F | Ht 67.0 in | Wt 156.0 lb

## 2011-05-03 DIAGNOSIS — M25519 Pain in unspecified shoulder: Secondary | ICD-10-CM

## 2011-05-03 DIAGNOSIS — F411 Generalized anxiety disorder: Secondary | ICD-10-CM

## 2011-05-03 DIAGNOSIS — M25511 Pain in right shoulder: Secondary | ICD-10-CM

## 2011-05-03 DIAGNOSIS — M19019 Primary osteoarthritis, unspecified shoulder: Secondary | ICD-10-CM | POA: Insufficient documentation

## 2011-05-03 MED ORDER — NAPROXEN 500 MG PO TABS: 500.0000 mg | ORAL_TABLET | Freq: Two times a day (BID) | ORAL | Status: AC
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
## 2011-05-03 NOTE — Patient Instructions (Addendum)
1. Please keep your right shoulder sling on 2. Please continue to take your Vicodin as scheduled 3. Please take Naproxen 500 mg oral twice a day with meals for your shoulder pain as well 4. Will arrange the orthopedic referral for you on Monday. Please call back to the clinic on Monday, July 9th to confirm the appointment with Ortho. 5. Please go to ER if your symptoms are worsening.

## 2011-05-04 LAB — DRUGS OF ABUSE SCREEN W/O ALC, ROUTINE URINE
Amphetamine Screen, Ur: NEGATIVE
Barbiturate Quant, Ur: NEGATIVE
Benzodiazepines.: POSITIVE — AB
Cocaine Metabolites: NEGATIVE
Phencyclidine (PCP): NEGATIVE

## 2011-05-07 LAB — OPIATE, QUANTITATIVE, URINE
Oxycodone - Total: NEGATIVE NG/ML
Oxymorphone: NEGATIVE NG/ML

## 2011-05-07 LAB — BENZODIAZEPINE, QUANTITATIVE, URINE
Alprazolam (GC/LC/MS), ur confirm: NEGATIVE NG/ML
Flurazepam Metabolite: NEGATIVE NG/ML
Nordiazepam GC/MS Conf: NEGATIVE NG/ML
Oxazepam GC/MS Conf: NEGATIVE NG/ML
Temazapam: NEGATIVE NG/ML

## 2011-05-13 ENCOUNTER — Other Ambulatory Visit: Payer: Self-pay | Admitting: Internal Medicine

## 2011-05-22 ENCOUNTER — Other Ambulatory Visit: Payer: Self-pay | Admitting: *Deleted

## 2011-05-22 NOTE — Telephone Encounter (Signed)
Unfortunately patient violated his pain contract with a negative UDS 05/13/11, and he has a history of polysustance abuse. I cannot fill this early. He should probably plan to come in and speak to his primary care physician.

## 2011-05-22 NOTE — Telephone Encounter (Signed)
I talked to pt; informed of denial to early refill on his medication requests; states o.k.  Informed him of his appt w/Dr Allena Katz 05/29/11 @4 :15PM.

## 2011-05-22 NOTE — Telephone Encounter (Signed)
Pt requesting an early refill.  States he needs to go to [place] to see his brother who is a dialysis pt but had to be admitted to the hospital d/t change in his condition; he will be leaving later this afternoon. Requesting refill today.

## 2011-05-29 ENCOUNTER — Encounter: Payer: Medicaid Other | Admitting: Internal Medicine

## 2011-06-26 ENCOUNTER — Encounter: Payer: Medicaid Other | Admitting: Internal Medicine

## 2011-07-05 ENCOUNTER — Encounter: Payer: Medicaid Other | Admitting: Internal Medicine

## 2011-07-17 LAB — COMPREHENSIVE METABOLIC PANEL
ALT: 50
AST: 88 — ABNORMAL HIGH
Calcium: 9.4
Creatinine, Ser: 0.93
GFR calc Af Amer: >60
Glucose, Bld: 120 — ABNORMAL HIGH
Sodium: 130 — ABNORMAL LOW
Total Protein: 8.1

## 2011-07-17 LAB — DIFFERENTIAL
Eosinophils Absolute: 0
Lymphocytes Relative: 30
Lymphs Abs: 2.8
Monocytes Relative: 8
Neutrophils Relative %: 62

## 2011-07-17 LAB — POCT URINALYSIS DIP (DEVICE)
Nitrite: NEGATIVE
Protein, ur: >=300 — AB
Urobilinogen, UA: 1
pH: 6

## 2011-07-17 LAB — CBC
MCHC: 36
MCV: 102.2 — ABNORMAL HIGH
RDW: 12.9

## 2011-07-17 LAB — LIPASE, BLOOD: Lipase: 24

## 2011-07-18 ENCOUNTER — Ambulatory Visit (INDEPENDENT_AMBULATORY_CARE_PROVIDER_SITE_OTHER): Payer: Medicaid Other | Admitting: Internal Medicine

## 2011-07-18 ENCOUNTER — Encounter: Payer: Self-pay | Admitting: Internal Medicine

## 2011-07-18 VITALS — BP 150/81 | HR 74 | Temp 97.0°F | Ht 67.5 in | Wt 152.1 lb

## 2011-07-18 DIAGNOSIS — M25519 Pain in unspecified shoulder: Secondary | ICD-10-CM

## 2011-07-18 DIAGNOSIS — M25511 Pain in right shoulder: Secondary | ICD-10-CM

## 2011-07-18 DIAGNOSIS — I10 Essential (primary) hypertension: Secondary | ICD-10-CM

## 2011-07-18 NOTE — Progress Notes (Signed)
  Subjective:    Patient ID: [name], male    DOB: [birth year], 55 y.o.   MRN: [id number]  HPI [name] is a 55 year man with past with history of osteoarthritis, anxiety who comes to the clinic with right shoulder pain for about 2 months now. He was in the clinic on 05/03/2011 and was provided with shoulder sling and referral to orthopedic which did not happen due to some miscommunication. He says that his pain is getting worse since then and is no better. He has decreased range of motion of his shoulder due to pain. Denies any pain in the left shoulder. He denies any fever, chills, nausea vomiting, chest pain, short of breath, abdominal pain, diarrhea.    Review of Systems    as per history of present illness, all other systems reviewed and negative. Objective:   Physical Exam Constitutional: Vital signs reviewed.  Patient is a well-developed and well-nourished  in no acute distress and cooperative with exam. Alert and oriented x3.  Head: Normocephalic and atraumatic Mouth: no erythema or exudates, MMM Eyes: PERRL, EOMI, conjunctivae normal, No scleral icterus.  Neck: Supple, Trachea midline normal ROM, No JVD, mass, thyromegaly, or carotid bruit present.  Cardiovascular: RRR, S1 normal, S2 normal, no MRG, pulses symmetric and intact bilaterally Pulmonary/Chest: CTAB, no wheezes, rales, or rhonchi Abdominal: Soft. Non-tender, non-distended, bowel sounds are normal, no masses, organomegaly, or guarding present.  Musculoskeletal: Right shoulder decreased range of motion due to pain. Mild tenderness to palpation to shoulder joint.  Neurological: A&O x3, Strenght is normal and symmetric bilaterally, cranial nerve II-XII are grossly intact, no focal motor deficit, sensory intact to light touch bilaterally.  Skin: Warm, dry and intact. No rash, cyanosis, or clubbing.  Psychiatric: Normal mood and affect. speech and behavior is normal. Judgment and thought content normal.  Cognition and memory are normal.          Assessment & Plan:

## 2011-07-18 NOTE — Patient Instructions (Signed)
Please make an appointment in 2-3 months. Please followup with the sports medicine clinic Dr. for your right shoulder pain.

## 2011-07-18 NOTE — Assessment & Plan Note (Signed)
Right shoulder pain is been going on for more than 2 months now. Was given shoulder sling and and NSAID's during last clinic visit- did not help much. Orthopedic referral was done-but did not happen with some unknown reason. As his pain is getting worse and not getting better, Although his x-ray did not show any acute pathology, I will refer him to sports medicine clinic for further evaluation and management. He was seen there in March 2012 for other joint issues.

## 2011-07-18 NOTE — Assessment & Plan Note (Signed)
Blood pressure 150/81. Systolic mildly elevated. No change in medication for now. Will reassess during the next visit.

## 2011-07-24 LAB — BASIC METABOLIC PANEL
BUN: <1 — ABNORMAL LOW
Calcium: 8.8
Chloride: 99
Creatinine, Ser: 0.52
GFR calc Af Amer: >60
GFR calc non Af Amer: >60
GFR calc non Af Amer: >60
Glucose, Bld: 126 — ABNORMAL HIGH
Potassium: 4.1

## 2011-07-24 LAB — COMPREHENSIVE METABOLIC PANEL
ALT: 53
AST: 66 — ABNORMAL HIGH
Albumin: 3.4 — ABNORMAL LOW
Alkaline Phosphatase: 52
Alkaline Phosphatase: 73
BUN: 4 — ABNORMAL LOW
CO2: 23
CO2: 26
CO2: 26
Calcium: 9.2
Chloride: 104
Creatinine, Ser: 0.62
Creatinine, Ser: 0.77
GFR calc Af Amer: >60
GFR calc Af Amer: >60
GFR calc non Af Amer: >60
GFR calc non Af Amer: >60
GFR calc non Af Amer: >60
Glucose, Bld: 110 — ABNORMAL HIGH
Glucose, Bld: 117 — ABNORMAL HIGH
Potassium: 3 — ABNORMAL LOW
Potassium: 3.3 — ABNORMAL LOW
Sodium: 136
Total Bilirubin: 1.2
Total Bilirubin: 1.6 — ABNORMAL HIGH
Total Protein: 6.3
Total Protein: 7.3

## 2011-07-24 LAB — CBC
HCT: 38.2 — ABNORMAL LOW
Hemoglobin: 13.4
Hemoglobin: 15.1
MCHC: 34.7
MCV: 101.3 — ABNORMAL HIGH
MCV: 99.8
Platelets: 114 — ABNORMAL LOW
Platelets: 118 — ABNORMAL LOW
Platelets: 130 — ABNORMAL LOW
RBC: 4.3
RBC: 4.53
RDW: 12.8
RDW: 12.9
WBC: 7.4
WBC: 9.2
WBC: 9.9

## 2011-07-24 LAB — GC/CHLAMYDIA PROBE AMP, URINE: Chlamydia, Swab/Urine, PCR: NEGATIVE

## 2011-07-24 LAB — MAGNESIUM
Magnesium: 1.7
Magnesium: 1.8

## 2011-07-24 LAB — URINE CULTURE
Colony Count: NO GROWTH
Culture: NO GROWTH

## 2011-07-24 LAB — URINALYSIS, DIPSTICK ONLY
Glucose, UA: NEGATIVE
Ketones, ur: 15 — AB
Protein, ur: 30 — AB

## 2011-07-24 LAB — RENAL FUNCTION PANEL
Albumin: 3 — ABNORMAL LOW
Calcium: 9.1
Chloride: 102
Creatinine, Ser: 0.66
GFR calc Af Amer: >60
GFR calc non Af Amer: >60
Phosphorus: 2.7

## 2011-07-24 LAB — URINALYSIS, ROUTINE W REFLEX MICROSCOPIC
Glucose, UA: NEGATIVE
Ketones, ur: NEGATIVE
Leukocytes, UA: NEGATIVE
Nitrite: NEGATIVE
Nitrite: NEGATIVE
Protein, ur: 100 — AB
Specific Gravity, Urine: 1.015
Urobilinogen, UA: >8 — ABNORMAL HIGH
pH: 6

## 2011-07-24 LAB — DIFFERENTIAL
Basophils Absolute: 0
Eosinophils Absolute: 0
Eosinophils Relative: 0
Lymphocytes Relative: 24
Lymphocytes Relative: 36
Lymphs Abs: 4.1 — ABNORMAL HIGH
Monocytes Absolute: 0.7
Monocytes Relative: 9
Neutrophils Relative %: 54

## 2011-07-24 LAB — RAPID URINE DRUG SCREEN, HOSP PERFORMED
Amphetamines: NOT DETECTED
Benzodiazepines: POSITIVE — AB
Cocaine: NOT DETECTED
Tetrahydrocannabinol: NOT DETECTED

## 2011-07-24 LAB — BILIRUBIN, FRACTIONATED(TOT/DIR/INDIR): Indirect Bilirubin: 1 — ABNORMAL HIGH

## 2011-07-24 LAB — URINE MICROSCOPIC-ADD ON

## 2011-07-24 LAB — LIPASE, BLOOD
Lipase: 272 — ABNORMAL HIGH
Lipase: 697 — ABNORMAL HIGH

## 2011-07-24 LAB — LACTATE DEHYDROGENASE: LDH: 128

## 2011-07-26 ENCOUNTER — Ambulatory Visit (INDEPENDENT_AMBULATORY_CARE_PROVIDER_SITE_OTHER): Payer: Medicaid Other | Admitting: Family Medicine

## 2011-07-26 ENCOUNTER — Encounter: Payer: Self-pay | Admitting: Family Medicine

## 2011-07-26 VITALS — BP 164/91 | HR 63 | Ht 67.0 in | Wt 150.0 lb

## 2011-07-26 DIAGNOSIS — S43429A Sprain of unspecified rotator cuff capsule, initial encounter: Secondary | ICD-10-CM

## 2011-07-26 DIAGNOSIS — M25511 Pain in right shoulder: Secondary | ICD-10-CM

## 2011-07-26 DIAGNOSIS — M75101 Unspecified rotator cuff tear or rupture of right shoulder, not specified as traumatic: Secondary | ICD-10-CM

## 2011-07-26 DIAGNOSIS — M25519 Pain in unspecified shoulder: Secondary | ICD-10-CM
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
## 2011-08-19 ENCOUNTER — Ambulatory Visit: Payer: Medicaid Other | Admitting: Family Medicine

## 2011-08-19 ENCOUNTER — Ambulatory Visit (INDEPENDENT_AMBULATORY_CARE_PROVIDER_SITE_OTHER): Payer: Medicaid Other | Admitting: Family Medicine

## 2011-08-19 ENCOUNTER — Ambulatory Visit (HOSPITAL_COMMUNITY)
Admission: RE | Admit: 2011-08-19 | Discharge: 2011-08-19 | Disposition: A | Discharge status: Home / Self Care | Payer: Medicaid Other | Source: Ambulatory Visit | Attending: Family Medicine | Admitting: Family Medicine

## 2011-08-19 VITALS — BP 131/89

## 2011-08-19 DIAGNOSIS — M75101 Unspecified rotator cuff tear or rupture of right shoulder, not specified as traumatic: Secondary | ICD-10-CM

## 2011-08-19 DIAGNOSIS — M25519 Pain in unspecified shoulder: Secondary | ICD-10-CM | POA: Insufficient documentation

## 2011-08-19 DIAGNOSIS — M7500 Adhesive capsulitis of unspecified shoulder: Secondary | ICD-10-CM

## 2011-08-19 DIAGNOSIS — M25511 Pain in right shoulder: Secondary | ICD-10-CM

## 2011-08-19 DIAGNOSIS — S43429A Sprain of unspecified rotator cuff capsule, initial encounter: Secondary | ICD-10-CM

## 2011-08-19 DIAGNOSIS — M19019 Primary osteoarthritis, unspecified shoulder: Secondary | ICD-10-CM | POA: Insufficient documentation

## 2011-08-19 NOTE — Assessment & Plan Note (Signed)
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  Will refer to PT.
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
## 2011-08-19 NOTE — Assessment & Plan Note (Signed)
Worsened.  Continue medications prescribed by PCP.  Refer to PT.

## 2011-08-19 NOTE — Assessment & Plan Note (Signed)
Concern pt is developing frozen shoulder due to R shoulder pain, not moving it due to pain and rotator cuff injury.  Will refer to PT for ROM and strengthening.

## 2011-08-22 ENCOUNTER — Telehealth: Payer: Self-pay | Admitting: *Deleted

## 2011-08-22 NOTE — Telephone Encounter (Signed)
Flad sent to Dr Allena Katz about a referral to urology - goes yearly. With Medicaid - office needs a referral from PCP. Stanton Kidney Jamarien Rodkey RN 08/22/11 4:50PM

## 2011-08-30 ENCOUNTER — Other Ambulatory Visit: Payer: Self-pay | Admitting: Internal Medicine

## 2011-08-30 DIAGNOSIS — F528 Other sexual dysfunction not due to a substance or known physiological condition: Secondary | ICD-10-CM

## 2011-09-16 ENCOUNTER — Other Ambulatory Visit: Payer: Self-pay | Admitting: *Deleted

## 2011-09-16 NOTE — Telephone Encounter (Signed)
States he 's going out-of-town to [place] tomorrow afternoon.

## 2011-09-17 ENCOUNTER — Ambulatory Visit: Payer: Medicaid Other | Admitting: Rehabilitative and Restorative Service Providers"

## 2011-09-17 NOTE — Telephone Encounter (Signed)
Patient has a controled med caution and contract violation on his chart. His UDS was negative for all of these medications. He will need to have appointment for evaluation.

## 2011-09-17 NOTE — Telephone Encounter (Signed)
Pt was called about making an appt for refills on Xanax and Vicodin per Dr. Phillips Odor; no answer, message left.

## 2011-09-18 ENCOUNTER — Encounter: Payer: Self-pay | Admitting: Internal Medicine

## 2011-09-18 ENCOUNTER — Ambulatory Visit (INDEPENDENT_AMBULATORY_CARE_PROVIDER_SITE_OTHER): Payer: Medicaid Other | Admitting: Internal Medicine

## 2011-09-18 DIAGNOSIS — G894 Chronic pain syndrome: Secondary | ICD-10-CM

## 2011-09-18 DIAGNOSIS — M25519 Pain in unspecified shoulder: Secondary | ICD-10-CM

## 2011-09-18 DIAGNOSIS — M25511 Pain in right shoulder: Secondary | ICD-10-CM

## 2011-09-18 NOTE — Progress Notes (Deleted)
  Subjective:    Patient ID: [name], male    DOB: 24-Jun-1956, 55 y.o.   MRN: [id number]  HPI    Review of Systems     Objective:   Physical Exam        Assessment & Plan:
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
## 2011-09-18 NOTE — Patient Instructions (Addendum)
Please give [place] a call on Monday to check for the results of your urine test. If they are appropriate, we will prescribe you pain medications and Xanax until you get an appointment with pain clinic. After that make an appointment as needed in to 4-5 months.

## 2011-09-18 NOTE — Progress Notes (Signed)
  Subjective:    Patient ID: [name], male    DOB: 1956-08-17, 55 y.o.   MRN: [id number]  HPI [name] is a 55 year old man with past with history of chronic pain syndrome with multiple joint abnormalities including right shoulder, ankle, left wrist- referred to sports medicine clinic for further management where he was managed with physical therapy. He was called in for the appointment for management of his narcotic pain medications. Last result of urine drug screen done in July 2012 showed negative for hydrocodone and benzodiazepines which he is prescribed from the clinic. The previous test done earlier this year showed high concentration of hydrocodone than it usually should be.  His UDS were though negative for cocaine or marijuana. Dr. Phillips Odor discussed this result with him and explained him that we can no longer prescribe him control substances because this is a violation of pain contract- as the prescription medications were not found an appropriate amount as expected in his urine. He got really frustrated and started yelling and cursing meanwhile. I and Dr. Phillips Odor tried to explain him in detail that still we cannot prescribe prescription pain medication Today at any cost. We will though get the urine sample today and if that is appropriate we will refer him to pain clinic for management of his pain medication.    Review of Systems      no fever, chills, nausea, vomiting, abdominal pain, chest pain, short of breath. Objective:   Physical Exam  Vitals: Reviewed and stable. General: Angry and anxious. HEENT: PERRL, EOMI, no scleral icterus Cardiac: RRR, no rubs, murmurs or gallops Pulm: clear to auscultation bilaterally, moving normal volumes of air Abd: soft, nontender, nondistended, BS present Ext: warm and well perfused, no pedal edema Neuro: alert and oriented X3, cranial nerves II-XII grossly intact, strength and sensation to light touch equal in bilateral upper and  lower extremities       Assessment & Plan:

## 2011-09-18 NOTE — Assessment & Plan Note (Signed)
As described in history of present illness, patient was found to be in violation of the prescription medication contract according to his last 2 UDS this year. As Dr. Phillips Odor and I tried to explain him that we can no longer prescribe him this control substances today he got really frustrated and angry. Security was called meanwhile. He the agree to give a urine sample and will call on Monday to get the result. If his urine drug screen is appropriate for benzos and hydrocodone, we will prescribe him enough pills until he can get an appointment with pain clinic. I discussed this plan with him in details with Dr. Phillips Odor and he verbalized understanding along with frustration and anger.

## 2011-09-18 NOTE — Progress Notes (Deleted)
  Subjective:    Patient ID: [name], male    DOB: 05/02/1956, 55 y.o.   MRN: [id number]  HPI    Review of Systems     Objective:   Physical Exam        Assessment & Plan:   

## 2011-09-23 ENCOUNTER — Telehealth: Payer: Self-pay | Admitting: *Deleted

## 2011-09-23 LAB — OPIATES/OPIOIDS (LC/MS-MS)
Hydrocodone: 50 NG/ML
Hydromorphone: NEGATIVE NG/ML
Oxycodone, ur: 744 NG/ML — ABNORMAL HIGH
Oxymorphone: 41 NG/ML

## 2011-09-23 LAB — PRESCRIPTION ABUSE MONITORING 15P, URINE
Amphetamine/Meth: NEGATIVE NG/ML
Barbiturate Screen, Urine: NEGATIVE NG/ML
Buprenorphine, Urine: NEGATIVE NG/ML
Carisoprodol, Urine: NEGATIVE NG/ML
Cocaine Metabolites: NEGATIVE NG/ML
Creatinine, Urine: 104.3 MG/DL
Methadone Screen, Urine: NEGATIVE NG/ML
Opiate Screen, Urine: NEGATIVE NG/ML
Propoxyphene: NEGATIVE NG/ML
Zolpidem, Urine: NEGATIVE NG/ML

## 2011-09-23 NOTE — Telephone Encounter (Signed)
Patient has called several times waiting for result.  I called him back because of the repeat calls to clinic.  I did not share the care plan and the specific results of the test but I did say it didn't appear as if he is taking his medication as he should and that was at issue. Please f/u with him clinically and let him know about his prescriptions ASAP.  If you need me to then manage his behavior let me know.  I do not like the fact that security had to be called at all and am unclear on how to manage his continuing with [facility].  I am certain that poor behavior going forward should not be tolerated.

## 2011-09-23 NOTE — Progress Notes (Signed)
I have seen this patient with Dr. Allena Katz and reviewed and discussed the care of this patient in detail with the resident physician including pertinent patient records, physical exam findings and data. I agree with details of this encounter. [name] should not be prescribed opiates from our clinic. Security had to be called because of his belligerent behavior. He is a high risk patient. He was clearly angry at his visit. I will review the results of his urine drug screen this morning. I doubt he will be allowed a second chance to be prescribed chronic opiates.

## 2011-09-24 NOTE — Telephone Encounter (Signed)
See note below

## 2011-09-24 NOTE — Telephone Encounter (Signed)
I was directly involved with the care of this patient. His urine drug screen shows oxycodone which we have not prescribed and below cut-off hydrocodone not consistent with how he was reportedly taking the medication. From a policy and safety standpoint it is not safe for [place] to continue to prescribe for someone who is augmenting their regimen with "street oxycodone". If he feels that he has undertreated pain we can refer him to a pain clinic where he can be very closely monitored-they are better  equipped to provide very close supervision of his pain medication regimen.

## 2011-09-24 NOTE — Telephone Encounter (Signed)
I quickly reviewed chart and most recent UDS which was not appropriate. I will leave to Drs Allena Katz and / or Phillips Odor to follow up and let pt know that UDS was not appropriate. I agree that we should not be prescribing controlled substances. Pt likely will transfer care. If not, he will be expected to behave just like every other pt.

## 2011-10-02 ENCOUNTER — Ambulatory Visit: Payer: Medicaid Other | Attending: Family Medicine | Admitting: Rehabilitative and Restorative Service Providers"

## 2012-05-16 IMAGING — CR DG SHOULDER 1V*R*
1 series · 1 of 1 positions shown · non-contrast
Comparison: 05/03/2011

CLINICAL DATA: Shoulder pain.  No recent injury or trauma.  Pain
for 4 months.

RIGHT SHOULDER - 1 VIEW

[w shoulder neutral]
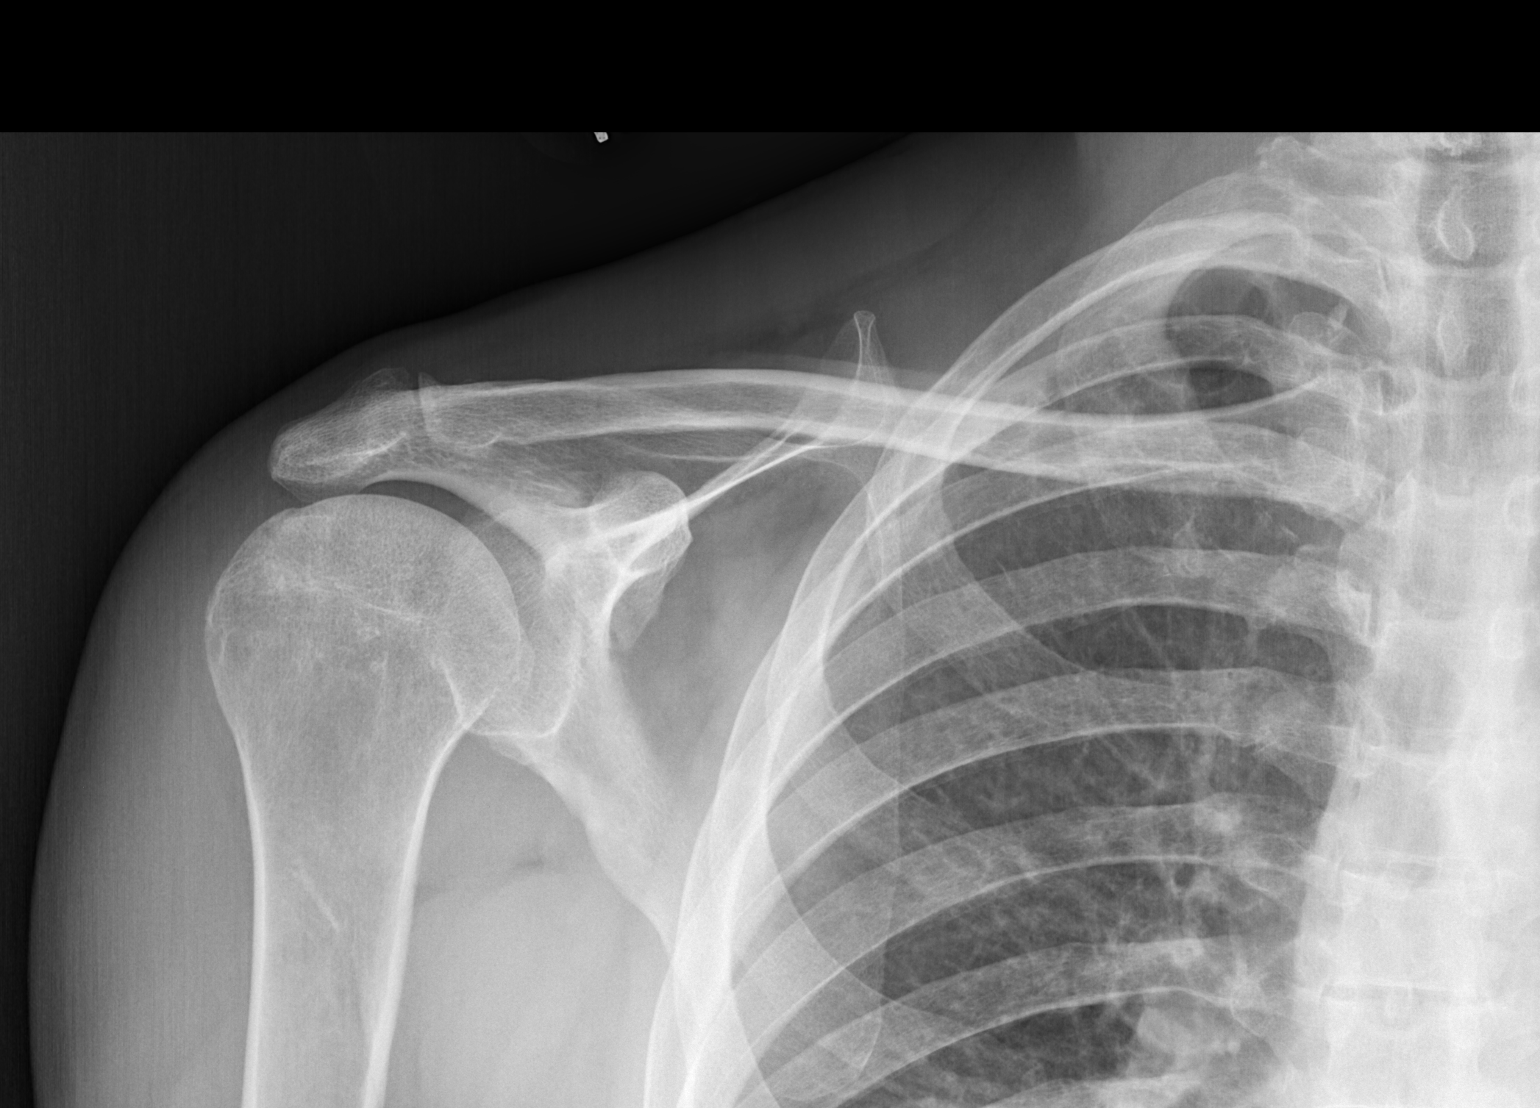

[1 of 1 positions shown; findings below may reference images not displayed]

FINDINGS: Single view is performed, showing mild degenerative
change at the acromioclavicular joint.  There is no evidence for
acute fracture, subluxation.  Right lung apex is unremarkable.
IMPRESSION: 1.  Mild acromioclavicular degenerative change.
2.  No evidence for acute abnormality.
3.  Study is one view only.

## 2012-10-31 ENCOUNTER — Inpatient Hospital Stay (HOSPITAL_COMMUNITY)
Admission: EM | Admit: 2012-10-31 | Discharge: 2012-11-03 | DRG: 439 | Disposition: A | Discharge status: Home / Self Care | Payer: Medicaid Other | Attending: Internal Medicine | Admitting: Internal Medicine

## 2012-10-31 ENCOUNTER — Emergency Department (HOSPITAL_COMMUNITY): Payer: Medicaid Other

## 2012-10-31 ENCOUNTER — Encounter (HOSPITAL_COMMUNITY): Payer: Self-pay | Admitting: Emergency Medicine

## 2012-10-31 DIAGNOSIS — F4310 Post-traumatic stress disorder, unspecified: Secondary | ICD-10-CM | POA: Diagnosis present

## 2012-10-31 DIAGNOSIS — D72829 Elevated white blood cell count, unspecified: Secondary | ICD-10-CM | POA: Diagnosis present

## 2012-10-31 DIAGNOSIS — F17200 Nicotine dependence, unspecified, uncomplicated: Secondary | ICD-10-CM | POA: Diagnosis present

## 2012-10-31 DIAGNOSIS — K859 Acute pancreatitis without necrosis or infection, unspecified: Secondary | ICD-10-CM

## 2012-10-31 DIAGNOSIS — M1990 Unspecified osteoarthritis, unspecified site: Secondary | ICD-10-CM | POA: Diagnosis present

## 2012-10-31 DIAGNOSIS — E785 Hyperlipidemia, unspecified: Secondary | ICD-10-CM | POA: Diagnosis present

## 2012-10-31 DIAGNOSIS — E876 Hypokalemia: Secondary | ICD-10-CM | POA: Diagnosis present

## 2012-10-31 DIAGNOSIS — J309 Allergic rhinitis, unspecified: Secondary | ICD-10-CM | POA: Diagnosis present

## 2012-10-31 DIAGNOSIS — K219 Gastro-esophageal reflux disease without esophagitis: Secondary | ICD-10-CM | POA: Diagnosis present

## 2012-10-31 DIAGNOSIS — Z7982 Long term (current) use of aspirin: Secondary | ICD-10-CM

## 2012-10-31 DIAGNOSIS — M069 Rheumatoid arthritis, unspecified: Secondary | ICD-10-CM | POA: Diagnosis present

## 2012-10-31 DIAGNOSIS — Z79899 Other long term (current) drug therapy: Secondary | ICD-10-CM

## 2012-10-31 DIAGNOSIS — I10 Essential (primary) hypertension: Secondary | ICD-10-CM | POA: Diagnosis present

## 2012-10-31 DIAGNOSIS — G894 Chronic pain syndrome: Secondary | ICD-10-CM

## 2012-10-31 HISTORY — DX: Acute pancreatitis without necrosis or infection, unspecified: K85.90

## 2012-10-31 HISTORY — DX: Essential (primary) hypertension: I10

## 2012-10-31 LAB — CBC WITH DIFFERENTIAL/PLATELET
Basophils Absolute: 0 10*3/uL (ref 0.0–0.1)
Basophils Relative: 0 % (ref 0–1)
Eosinophils Absolute: 0 10*3/uL (ref 0.0–0.7)
Eosinophils Relative: 0 % (ref 0–5)
HCT: 45.7 % (ref 39.0–52.0)
Hemoglobin: 16.9 g/dL (ref 13.0–17.0)
Lymphocytes Relative: 10 % — ABNORMAL LOW (ref 12–46)
Lymphs Abs: 1.2 K/uL (ref 0.7–4.0)
MCH: 34.9 pg — ABNORMAL HIGH (ref 26.0–34.0)
MCHC: 37 g/dL — ABNORMAL HIGH (ref 30.0–36.0)
MCV: 94.4 fL (ref 78.0–100.0)
Monocytes Absolute: 0.5 K/uL (ref 0.1–1.0)
Monocytes Relative: 4 % (ref 3–12)
Neutro Abs: 10.5 K/uL — ABNORMAL HIGH (ref 1.7–7.7)
Neutrophils Relative %: 86 % — ABNORMAL HIGH (ref 43–77)
Platelets: 111 10*3/uL — ABNORMAL LOW (ref 150–400)
RBC: 4.84 MIL/uL (ref 4.22–5.81)
RDW: 12 % (ref 11.5–15.5)
WBC: 12.2 10*3/uL — ABNORMAL HIGH (ref 4.0–10.5)

## 2012-10-31 LAB — URINE MICROSCOPIC-ADD ON

## 2012-10-31 LAB — COMPREHENSIVE METABOLIC PANEL WITH GFR
ALT: 47 U/L (ref 0–53)
Albumin: 2.9 g/dL — ABNORMAL LOW (ref 3.5–5.2)
Alkaline Phosphatase: 110 U/L (ref 39–117)
BUN: 8 mg/dL (ref 6–23)
Chloride: 102 meq/L (ref 96–112)
GFR calc Af Amer: >90 mL/min (ref >90)
Glucose, Bld: 176 mg/dL — ABNORMAL HIGH (ref 70–99)
Potassium: 4.7 meq/L (ref 3.5–5.1)
Total Bilirubin: 1.1 mg/dL (ref 0.3–1.2)

## 2012-10-31 LAB — URINALYSIS, ROUTINE W REFLEX MICROSCOPIC
Bilirubin Urine: NEGATIVE
Glucose, UA: NEGATIVE mg/dL
Leukocytes, UA: NEGATIVE
Nitrite: NEGATIVE
Protein, ur: 100 mg/dL — AB
Specific Gravity, Urine: 1.015 (ref 1.005–1.030)
Urobilinogen, UA: 1 mg/dL (ref 0.0–1.0)
pH: 6 (ref 5.0–8.0)

## 2012-10-31 LAB — COMPREHENSIVE METABOLIC PANEL
AST: 107 U/L — ABNORMAL HIGH (ref 0–37)
CO2: 20 mEq/L (ref 19–32)
Calcium: 8.5 mg/dL (ref 8.4–10.5)
Creatinine, Ser: 0.65 mg/dL (ref 0.50–1.35)
GFR calc non Af Amer: >90 mL/min (ref >90)
Sodium: 133 mEq/L — ABNORMAL LOW (ref 135–145)
Total Protein: 8.2 g/dL (ref 6.0–8.3)

## 2012-10-31 LAB — LIPASE, BLOOD: Lipase: 976 U/L — ABNORMAL HIGH (ref 11–59)

## 2012-10-31 MED ORDER — HYDROMORPHONE HCL PF 1 MG/ML IJ SOLN
1.0000 mg | Freq: Once | INTRAMUSCULAR | Status: AC
  Administered 2012-10-31: 1 mg via INTRAVENOUS
  Filled 2012-10-31: qty 1

## 2012-10-31 MED ORDER — LORAZEPAM 1 MG PO TABS: 1.0000 mg | ORAL_TABLET | Freq: Four times a day (QID) | ORAL | Status: DC | PRN

## 2012-10-31 MED ORDER — LORAZEPAM 2 MG/ML IJ SOLN: 1.0000 mg | Freq: Four times a day (QID) | INTRAMUSCULAR | Status: DC | PRN

## 2012-10-31 MED ORDER — FOLIC ACID 1 MG PO TABS
1.0000 mg | ORAL_TABLET | Freq: Every day | ORAL | Status: DC
  Administered 2012-11-01 – 2012-11-03 (×3): 1 mg via ORAL
  Filled 2012-10-31 (×3): qty 1

## 2012-10-31 MED ORDER — SODIUM CHLORIDE 0.9 % IV SOLN
INTRAVENOUS | Status: DC
  Administered 2012-11-01 (×2): via INTRAVENOUS
  Administered 2012-11-02 (×2): 1000 mL via INTRAVENOUS
  Administered 2012-11-02 – 2012-11-03 (×3): via INTRAVENOUS

## 2012-10-31 MED ORDER — ONDANSETRON HCL 4 MG/2ML IJ SOLN
4.0000 mg | Freq: Four times a day (QID) | INTRAMUSCULAR | Status: DC | PRN
  Administered 2012-11-02: 4 mg via INTRAVENOUS
  Filled 2012-10-31: qty 2

## 2012-10-31 MED ORDER — ONDANSETRON HCL 4 MG/2ML IJ SOLN
4.0000 mg | Freq: Once | INTRAMUSCULAR | Status: AC
  Administered 2012-10-31: 4 mg via INTRAVENOUS
  Filled 2012-10-31: qty 2

## 2012-10-31 MED ORDER — ENOXAPARIN SODIUM 40 MG/0.4ML ~~LOC~~ SOLN
40.0000 mg | SUBCUTANEOUS | Status: DC
  Administered 2012-11-01 – 2012-11-03 (×3): 40 mg via SUBCUTANEOUS
  Filled 2012-10-31 (×3): qty 0.4

## 2012-10-31 MED ORDER — ADULT MULTIVITAMIN W/MINERALS CH
1.0000 | ORAL_TABLET | Freq: Every day | ORAL | Status: DC
  Administered 2012-11-01 – 2012-11-03 (×3): 1 via ORAL
  Filled 2012-10-31 (×3): qty 1

## 2012-10-31 MED ORDER — HYDRALAZINE HCL 20 MG/ML IJ SOLN
10.0000 mg | INTRAMUSCULAR | Status: DC | PRN
  Filled 2012-10-31 (×2): qty 0.5

## 2012-10-31 MED ORDER — ONDANSETRON HCL 4 MG PO TABS: 4.0000 mg | ORAL_TABLET | Freq: Four times a day (QID) | ORAL | Status: DC | PRN

## 2012-10-31 MED ORDER — SODIUM CHLORIDE 0.9 % IV BOLUS (SEPSIS)
1000.0000 mL | Freq: Once | INTRAVENOUS | Status: AC
  Administered 2012-10-31: 1000 mL via INTRAVENOUS

## 2012-10-31 MED ORDER — ALUM & MAG HYDROXIDE-SIMETH 200-200-20 MG/5ML PO SUSP: 30.0000 mL | Freq: Four times a day (QID) | ORAL | Status: DC | PRN

## 2012-10-31 MED ORDER — VITAMIN B-1 100 MG PO TABS
100.0000 mg | ORAL_TABLET | Freq: Every day | ORAL | Status: DC
  Administered 2012-11-01 – 2012-11-03 (×3): 100 mg via ORAL
  Filled 2012-10-31 (×3): qty 1

## 2012-10-31 MED ORDER — HYDROMORPHONE HCL PF 1 MG/ML IJ SOLN
1.0000 mg | Freq: Once | INTRAMUSCULAR | Status: AC
  Administered 2012-10-31: 1 mg via INTRAVENOUS
  Filled 2012-10-31 (×2): qty 1

## 2012-10-31 MED ORDER — MORPHINE SULFATE 4 MG/ML IJ SOLN
4.0000 mg | INTRAMUSCULAR | Status: DC | PRN
  Administered 2012-11-01 (×3): 4 mg via INTRAVENOUS
  Filled 2012-10-31 (×3): qty 1

## 2012-10-31 MED ORDER — ACETAMINOPHEN 650 MG RE SUPP: 650.0000 mg | Freq: Four times a day (QID) | RECTAL | Status: DC | PRN

## 2012-10-31 MED ORDER — THIAMINE HCL 100 MG/ML IJ SOLN
100.0000 mg | Freq: Every day | INTRAMUSCULAR | Status: DC
  Filled 2012-10-31 (×2): qty 1

## 2012-10-31 MED ORDER — HYDROCHLOROTHIAZIDE 12.5 MG PO CAPS
12.5000 mg | ORAL_CAPSULE | Freq: Every day | ORAL | Status: DC
  Administered 2012-10-31: 12.5 mg via ORAL
  Filled 2012-10-31 (×2): qty 1

## 2012-10-31 MED ORDER — LISINOPRIL-HYDROCHLOROTHIAZIDE 10-12.5 MG PO TABS: 1.0000 | ORAL_TABLET | Freq: Every day | ORAL | Status: DC

## 2012-10-31 MED ORDER — ACETAMINOPHEN 325 MG PO TABS: 650.0000 mg | ORAL_TABLET | Freq: Four times a day (QID) | ORAL | Status: DC | PRN

## 2012-10-31 MED ORDER — LISINOPRIL 10 MG PO TABS
10.0000 mg | ORAL_TABLET | Freq: Every day | ORAL | Status: DC
  Administered 2012-10-31: 10 mg via ORAL
  Filled 2012-10-31 (×2): qty 1

## 2012-10-31 NOTE — ED Notes (Signed)
Pt in via [facility] c/o abd pain. Pt reports pain 10/10 since 6am. Pt has hx of pancreatitis. Pt also c/o N/V.

## 2012-10-31 NOTE — ED Notes (Signed)
Patient BP is elevated made ED doctor aware- will give patient BP medication for treatment

## 2012-10-31 NOTE — H&P (Signed)
Hospital Admission Note Date: 10/31/2012  Patient name: [name] Medical record number: [id number] Date of birth: [birth year] Age: 57 y.o. Gender: male PCP: Lyn Hollingshead, MD  Medical Service: [facility]  Attending physician:  Dr. Criselda Peaches    1st Contact: Dr. Garald Braver   Pager: [phone] 2nd Contact: Dr. Dorise Hiss   Pager: [phone] After 5 pm or weekends: 1st Contact:      Pager: [phone] 2nd Contact:      Pager: [phone]  Chief Complaint: Abdominal pain  History of Present Illness: [name] 57yo AA man pmh of RA, HTN, anxiety, polysubstance (cocaine/EtOH) and illegal narcotic distribution behavior presented with intense epigastric abdominal pain with nausea and vomiting for past 24 hrs. Pt has had a remote hx of pancreatitis in the past and felt pain was similar. Pt indulges daily with alcohol mostly beer but wouldn't quantify amount. Pt states he yielded to many offers for increased alcohol consumption for holiday celebration with his last consumption being late yesterday evening. Pt feels this may have triggered abdominal pain. Pt denied ever having seizures, blackouts in the past 2/2 alcohol withdrawal. Pt has hx of illegal selling/distribution of pain medication with violation of pain contract in 2012 but states has been attending and receiving pain medication from a pain clinic (there are no records of these visits per chart review). Pt took extra pain medication this AM but that didn't alleviate the pain. Pt presented to ED 2/2 vomiting that exacerbated the pain and inability to take home meds. Pt is a daily smoker 1ppd with 30+ pack year but denied any other illicit drugs. Pt also states is recently recovering from a URI and has a residual cough. No diarrhea/hematochezia/hemetemesis, fevers/chills, or weight loss. Pt last BM this AM and able to pass flatus.  Meds: Medications Prior to Admission  Medication Sig Dispense Refill  . ALPRAZolam (XANAX) 1 MG tablet Take 0.5 mg by mouth at  bedtime as needed. Anxiety.Take 1/2  Tablet as needed twice a day and one tablet at bedtime.      Marland Kitchen aspirin 81 MG EC tablet Take 81 mg by mouth daily.        Marland Kitchen gemfibrozil (LOPID) 600 MG tablet Take 600 mg by mouth 2 (two) times daily.        Marland Kitchen lisinopril-hydrochlorothiazide (PRINZIDE,ZESTORETIC) 10-12.5 MG per tablet Take 1 tablet by mouth daily.      . metoprolol tartrate (LOPRESSOR) 25 MG tablet Take 25 mg by mouth 2 (two) times daily.          Allergies: Allergies as of 10/31/2012  . (No Known Allergies)   Past Medical History  Diagnosis Date  . Hyperlipidemia   . Alcohol abuse   . Tobacco user   . Anxiety     With Post traumatic stress disorder  . Osteoarthritis   . GERD (gastroesophageal reflux disease)   . Allergic rhinitis   . Rheumatoid arthritis   . Hypertension   . Pancreatitis    History reviewed. No pertinent past surgical history. History reviewed. No pertinent family history. History   Social History  . Marital Status: Single    Spouse Name: N/A    Number of Children: N/A  . Years of Education: N/A   Occupational History  . Not on file.   Social History Main Topics  . Smoking status: Current Every Day Smoker -- 0.4 packs/day    Types: Cigarettes  . Smokeless tobacco: Never Used  . Alcohol Use: 0.0 oz/week    7-14  Cans of beer per week  . Drug Use: No  . Sexually Active: Not on file   Other Topics Concern  . Not on file   Social History Narrative   [name]. Married- 1 male sexual partner.Denies drug use but has hx of crack cocaine, alcohol and tobacco use.    Review of Systems: A comprehensive review of systems was negative. Pertinent listed in HPI  Physical Exam: Blood pressure 189/89, pulse 94, temperature 97.8 F (36.6 C), temperature source Oral, resp. rate 18, SpO2 96.00%. General: resting in bed, some slight discomfort HEENT: PERRL, EOMI, no scleral icterus Cardiac: RRR, no rubs, 2/6 systolic ejection murmur LICS w/o radiation, no  gallops Pulm: clear to auscultation bilaterally, moving normal volumes of air Abd: soft, ttp in epigastrium and RLQ , nondistended, hypoactive BS Ext: warm and well perfused, no pedal edema Neuro: alert and oriented X3, cranial nerves II-XII grossly intact   Lab results: Basic Metabolic Panel:  Basename 10/31/12 1705  NA 133*  K 4.7  CL 102  CO2 20  GLUCOSE 176*  BUN 8  CREATININE 0.65  CALCIUM 8.5  MG --  PHOS --   Liver Function Tests:  Basename 10/31/12 1705  AST 107*  ALT 47  ALKPHOS 110  BILITOT 1.1  PROT 8.2  ALBUMIN 2.9*    Basename 10/31/12 1705  LIPASE 976*  AMYLASE --   CBC:  Basename 10/31/12 1445  WBC 12.2*  NEUTROABS 10.5*  HGB 16.9  HCT 45.7  MCV 94.4  PLT 111*   Urinalysis:  Basename 10/31/12 1843  COLORURINE YELLOW  LABSPEC 1.015  PHURINE 6.0  GLUCOSEU NEGATIVE  HGBUR SMALL*  BILIRUBINUR NEGATIVE  KETONESUR TRACE*  PROTEINUR 100*  UROBILINOGEN 1.0  NITRITE NEGATIVE  LEUKOCYTESUR NEGATIVE   Imaging results:  US Abdomen Complete  10/31/2012  *RADIOLOGY REPORT*  Clinical Data:  Generalized abdominal pain.  History of alcohol use.  COMPLETE ABDOMINAL ULTRASOUND  Comparison:  None.  Findings:  Gallbladder:  Mildly distended. No shadowing gallstones or echogenic sludge.  No gallbladder wall thickening or pericholecystic fluid.  Negative sonographic Murphy's sign according to the ultrasound technologist.  Common bile duct:  Normal in caliber with maximum diameter approximating 5 mm.  The duct is obscured distally by overlying duodenal bowel gas.  Liver:  Mildly increased and coarsened echotexture diffusely without focal hepatic parenchymal abnormality. Patent portal vein with hepatopetal flow.  IVC:  Patent in its intrahepatic portion.  Obscured outside the liver by midline bowel gas.  Pancreas:  Diffusely hypoechoic.  Mild pancreatic ductal dilation up to 5 mm.  Small amount of peripancreatic fluid.  No focal pancreatic parenchymal mass.   Spleen:  Normal size and echotexture without focal parenchymal abnormality.  Right Kidney:  No hydronephrosis.  Well-preserved cortex.  No shadowing calculi.  Normal size and parenchymal echotexture without focal abnormalities.  Approximately 11.4 cm in length.  Left Kidney:  No hydronephrosis.  Well-preserved cortex.  No shadowing calculi.  Normal size and parenchymal echotexture without focal abnormalities.  Approximately 10.8 cm in length.  Abdominal aorta:  Normal in caliber in its proximal and mid portions; obscured distally by overlying bowel gas.  IMPRESSION:  1.  Diffuse hepatic steatosis and/or hepatocellular disease without focal hepatic parenchymal abnormality. 2.  Edematous pancreas with a small amount of peripancreatic fluid, consistent with acute pancreatitis.  Mild pancreatic ductal dilation up to 5 mm. 3.  Mildly distended gallbladder without evidence of cholelithiasis or cholecystitis.   Original Report Authenticated By: Hulan Saas, M.D.  Assessment & Plan by Problem:  Mr. Duplantis 57yo AA man pmh of RA, HTN, anxiety, polysubstance (cocaine/EtOH) and illegal narcotic distribution behavior presented with intense epigastric abdominal pain with nausea and vomiting for past 24 hrs found to have acute pancreatitis.   1. Acute pancreatitis 2/2 EtOH: Pt reports overindulgence of EtOH for past week with most recent drink last night. Labs with elevated lipase 900s without elevated Alk PO4. Pt doesn't meet SIRS criteria and BISAP score 0 calculating mortality <1%. Pt is on gemfibrozil at home therefore will need to assess TG. Pt received 1L bolus in ED. [place] displayed edematous pancreas and mild pancreatic ductal dilation with distended gallbladder w/o evidence of cholelithiasis along with hepatocellular dz. -NPO -lipid panel -NS 150cc/hr -CIWA protocol (will hold pt Xanax) -morphine prn pain -zofran prn   2. HTN: Pt acute asymptomatic rise in BP while in ED to 200s/120s this was initially  treated with lisinopril/HCTZ. Pt does have hx of cocaine use in the past that maybe contributing have been included in festivities although pt denies.  -UDS -IV hydralazine  Dispo: Disposition is deferred at this time, awaiting improvement of current medical problems. Anticipated discharge in approximately 2-3 day(s).   The patient does have a current PCP (PATEL,RAVI, MD), therefore will be requiring OPC follow-up after discharge.   The patient does not have transportation limitations that hinder transportation to clinic appointments.  Signed: Christen Bame 10/31/2012, 11:38 PM

## 2012-10-31 NOTE — ED Provider Notes (Signed)
Pt seen by Dr Oletta Lamas initially.  He has recurrent acute pancreatitis.  Pt still requiring IV narcotics.  Will consult with internal medicine clinic for admission.  He goes to the [facility].  Celene Kras, MD 10/31/12 [phone]

## 2012-10-31 NOTE — ED Notes (Signed)
Patient states that he still can use the bathroom

## 2012-10-31 NOTE — ED Notes (Signed)
Patient stated that he is unable to give urine sample at this time 

## 2012-10-31 NOTE — ED Notes (Signed)
Pyxis discrepency noted, fixed by Charge nurse Patty, and consulted by Pharmacy.

## 2012-10-31 NOTE — ED Notes (Signed)
ZOX:[id number]<AV> Expected date:10/31/12<BR> Expected time: 2:02 PM<BR> Means of arrival:Ambulance<BR> Comments:<BR> HTN

## 2012-10-31 NOTE — ED Provider Notes (Signed)
History     CSN: [id number]  Arrival date & time 10/31/12  1409   First MD Initiated Contact with Patient 10/31/12 1430      Chief Complaint  Patient presents with  . Abdominal Pain    (Consider location/radiation/quality/duration/timing/severity/associated sxs/prior treatment) HPI Comments: Patient reports that he had been drinking alcohol for the last 2 days, has a prior history of alcohol abuse. He denies daily drinking but drinks 3-4 days out of the week. Patient has previous history of pancreatitis reports pain woke him at 6 this morning with pain very similar to his prior episodes. It does not radiate, it is sharp and constant in nature. He did not take any medications prior to arrival. He has had 3 episodes of nonbloody, nonbilious emesis. He denies any recent constipation or diarrhea denies any melena or rectal bleeding. He denies chest pain, shortness of breath. He denies any recent antibiotic use, foreign travel. He denies any obvious fever or chills, I expect weight loss or night sweats.  Patient is a 57 y.o. male presenting with abdominal pain. The history is provided by the patient and medical records.  Abdominal Pain The primary symptoms of the illness include abdominal pain, nausea and vomiting. The primary symptoms of the illness do not include fever, diarrhea or dysuria.  Symptoms associated with the illness do not include chills, constipation or hematuria.    Past Medical History  Diagnosis Date  . Hyperlipidemia   . Alcohol abuse   . Tobacco user   . Anxiety     With Post traumatic stress disorder  . Osteoarthritis   . GERD (gastroesophageal reflux disease)   . Allergic rhinitis   . Rheumatoid arthritis   . Hypertension   . Pancreatitis     History reviewed. No pertinent past surgical history.  History reviewed. No pertinent family history.  History  Substance Use Topics  . Smoking status: Current Every Day Smoker -- 0.4 packs/day    Types: Cigarettes    . Smokeless tobacco: Never Used  . Alcohol Use: 0.0 oz/week    7-14 Cans of beer per week      Review of Systems  Constitutional: Negative for fever and chills.  Eyes: Negative for visual disturbance.  Respiratory: Negative for chest tightness.   Cardiovascular: Negative for chest pain.  Gastrointestinal: Positive for nausea, vomiting and abdominal pain. Negative for diarrhea, constipation, blood in stool and anal bleeding.  Genitourinary: Negative for dysuria, hematuria and flank pain.  All other systems reviewed and are negative.    Allergies  Review of patient's allergies indicates no known allergies.  Home Medications   Current Outpatient Rx  Name  Route  Sig  Dispense  Refill  . ALPRAZOLAM 1 MG PO TABS   Oral   Take 0.5 mg by mouth at bedtime as needed. Anxiety.Take 1/2  Tablet as needed twice a day and one tablet at bedtime.         . ASPIRIN 81 MG PO TBEC   Oral   Take 81 mg by mouth daily.           Marland Kitchen GEMFIBROZIL 600 MG PO TABS   Oral   Take 600 mg by mouth 2 (two) times daily.           Marland Kitchen LISINOPRIL-HYDROCHLOROTHIAZIDE 10-12.5 MG PO TABS   Oral   Take 1 tablet by mouth daily.         Marland Kitchen METOPROLOL TARTRATE 25 MG PO TABS   Oral  Take 25 mg by mouth 2 (two) times daily.             BP 197/102  Pulse 89  Temp 97.8 F (36.6 C) (Oral)  Resp 16  SpO2 95%  Physical Exam  Nursing note and vitals reviewed. Constitutional: He appears well-developed and well-nourished.  HENT:  Head: Normocephalic and atraumatic.  Mouth/Throat: Uvula is midline. Mucous membranes are dry.  Eyes: EOM are normal. No scleral icterus.  Neck: Normal range of motion. Neck supple.  Cardiovascular: Normal rate and regular rhythm.   Pulmonary/Chest: Effort normal. No respiratory distress. He has no wheezes.  Abdominal: Soft. He exhibits no distension. There is tenderness. There is guarding.  Neurological: He is alert.  Skin: Skin is warm and dry. No rash noted.     ED Course  Procedures (including critical care time)   Labs Reviewed  CBC WITH DIFFERENTIAL  LIPASE, BLOOD  COMPREHENSIVE METABOLIC PANEL  URINALYSIS, ROUTINE W REFLEX MICROSCOPIC   No results found.   No diagnosis found.  ra sat is 95% and i interpret to be normal    4:00 PM PT signed out to Dr. Lynelle Doctor.  I suspect pancreatitis due to alcohol intake.  Given age, will get imaging.  PT had some initial improvement with first round of analgesics, nausea resolved.  Dr Lynelle Doctor to follow up on labs, [place] and to reassess.  IF pt's symptoms are not improved, may require admission for most likely pancreatitis.    Impression: Abdominal pain, probably pancreatitis Alcohol abuse HTN likely due to pain  MDM  Likely same pancreatitis that pt has had in the past. Will keep npo, start IVF's, IV analgesics and antiemetics.  Pt has no CT's or U/S in epic.  Although I suspect likely alcoholic pancreatitis, will get U/S given age to ensure cyst or tumor is not cause of pancreatitis.  I suspect HTN is due to chronic history as well as severe pain.          Gavin Pound. Oletta Lamas, MD 11/01/12 2037

## 2012-11-01 LAB — CBC
Hemoglobin: 16.1 g/dL (ref 13.0–17.0)
MCH: 34.8 pg — ABNORMAL HIGH (ref 26.0–34.0)
MCHC: 36.6 g/dL — ABNORMAL HIGH (ref 30.0–36.0)
Platelets: 92 10*3/uL — ABNORMAL LOW (ref 150–400)

## 2012-11-01 LAB — COMPREHENSIVE METABOLIC PANEL
ALT: 33 U/L (ref 0–53)
AST: 69 U/L — ABNORMAL HIGH (ref 0–37)
Alkaline Phosphatase: 87 U/L (ref 39–117)
CO2: 17 mEq/L — ABNORMAL LOW (ref 19–32)
Chloride: 105 mEq/L (ref 96–112)
GFR calc non Af Amer: >90 mL/min (ref >90)
Potassium: 3.1 mEq/L — ABNORMAL LOW (ref 3.5–5.1)
Sodium: 136 mEq/L (ref 135–145)
Total Bilirubin: 1.2 mg/dL (ref 0.3–1.2)

## 2012-11-01 LAB — LIPID PANEL
Cholesterol: 121 mg/dL (ref 0–200)
HDL: 9 mg/dL — ABNORMAL LOW (ref >39)
LDL Cholesterol: 54 mg/dL (ref 0–99)
Triglycerides: 290 mg/dL — ABNORMAL HIGH (ref <150)
VLDL: 58 mg/dL — ABNORMAL HIGH (ref 0–40)

## 2012-11-01 LAB — RAPID URINE DRUG SCREEN, HOSP PERFORMED
Barbiturates: NOT DETECTED
Benzodiazepines: POSITIVE — AB

## 2012-11-01 MED ORDER — LISINOPRIL 10 MG PO TABS
10.0000 mg | ORAL_TABLET | Freq: Every day | ORAL | Status: DC
  Administered 2012-11-01 – 2012-11-03 (×3): 10 mg via ORAL
  Filled 2012-11-01 (×3): qty 1

## 2012-11-01 MED ORDER — POTASSIUM CHLORIDE CRYS ER 20 MEQ PO TBCR
40.0000 meq | EXTENDED_RELEASE_TABLET | ORAL | Status: AC
  Administered 2012-11-01 (×2): 40 meq via ORAL
  Filled 2012-11-01 (×2): qty 2

## 2012-11-01 MED ORDER — ZOLPIDEM TARTRATE 5 MG PO TABS
5.0000 mg | ORAL_TABLET | Freq: Once | ORAL | Status: AC
  Administered 2012-11-01: 5 mg via ORAL
  Filled 2012-11-01: qty 1

## 2012-11-01 MED ORDER — METOPROLOL TARTRATE 25 MG PO TABS
25.0000 mg | ORAL_TABLET | Freq: Two times a day (BID) | ORAL | Status: DC
  Administered 2012-11-01 – 2012-11-03 (×6): 25 mg via ORAL
  Filled 2012-11-01 (×8): qty 1

## 2012-11-01 MED ORDER — HYDROCHLOROTHIAZIDE 12.5 MG PO CAPS
12.5000 mg | ORAL_CAPSULE | Freq: Every day | ORAL | Status: DC
  Administered 2012-11-01 – 2012-11-03 (×3): 12.5 mg via ORAL
  Filled 2012-11-01 (×3): qty 1

## 2012-11-01 MED ORDER — GEMFIBROZIL 600 MG PO TABS
600.0000 mg | ORAL_TABLET | Freq: Two times a day (BID) | ORAL | Status: DC
  Administered 2012-11-01 – 2012-11-03 (×5): 600 mg via ORAL
  Filled 2012-11-01 (×7): qty 1

## 2012-11-01 MED ORDER — LISINOPRIL-HYDROCHLOROTHIAZIDE 10-12.5 MG PO TABS: 1.0000 | ORAL_TABLET | Freq: Every day | ORAL | Status: DC

## 2012-11-01 MED ORDER — MORPHINE SULFATE 2 MG/ML IJ SOLN
1.0000 mg | INTRAMUSCULAR | Status: DC | PRN
  Administered 2012-11-01: 3 mg via INTRAVENOUS
  Administered 2012-11-01: 2 mg via INTRAVENOUS
  Administered 2012-11-01: 3 mg via INTRAVENOUS
  Administered 2012-11-02 – 2012-11-03 (×8): 2 mg via INTRAVENOUS
  Filled 2012-11-01 (×5): qty 1
  Filled 2012-11-01 (×2): qty 2
  Filled 2012-11-01 (×5): qty 1

## 2012-11-01 NOTE — Progress Notes (Signed)
Subjective: The patient states his discomfort is a little better this morning and he has been NPO since last night. He is very thirsty this morning and thinks that he would be able to keep down some fluids. HE is not having any chest pains, SOB, fevers, chills, nausea, vomiting. No other complaints at this time. Strongly encouraged complete cessation of alcohol.   Objective: Vital signs in last 24 hours: Filed Vitals:   10/31/12 2220 11/01/12 0000 11/01/12 0509 11/01/12 0815  BP: 185/99  122/67 124/62  Pulse: 95 0 74 82  Temp: 98.9 F (37.2 C)  99 F (37.2 C)   TempSrc: Oral  Oral   Resp: 18  20   Height: 5\' 7"  (1.702 m)     Weight: 163 lb 9.3 oz (74.2 kg)     SpO2: 95%  95%    Weight change:   Intake/Output Summary (Last 24 hours) at 11/01/12 1111 Last data filed at 11/01/12 0815  Gross per 24 hour  Intake   1150 ml  Output    500 ml  Net    650 ml   PE: General: resting in bed, able to sit up well HEENT: PERRL, EOMI, no scleral icterus Cardiac: RRR, no rubs, murmurs or gallops Pulm: clear to auscultation bilaterally, moving normal volumes of air Abd: soft, diffusely mildly tender, nondistended, BS present but hypoactive Ext: warm and well perfused, no pedal edema Neuro: alert and oriented X3, cranial nerves II-XII grossly intact   Lab Results: Basic Metabolic Panel:  Lab 11/01/12 5621 10/31/12 1705  NA 136 133*  K 3.1* 4.7  CL 105 102  CO2 17* 20  GLUCOSE 168* 176*  BUN 9 8  CREATININE 0.91 0.65  CALCIUM 7.9* 8.5  MG -- --  PHOS -- --   Liver Function Tests:  Lab 11/01/12 0608 10/31/12 1705  AST 69* 107*  ALT 33 47  ALKPHOS 87 110  BILITOT 1.2 1.1  PROT 7.1 8.2  ALBUMIN 2.4* 2.9*    Lab 10/31/12 1705  LIPASE 976*  AMYLASE --   CBC:  Lab 11/01/12 0608 10/31/12 1445  WBC 17.4* 12.2*  NEUTROABS -- 10.5*  HGB 16.1 16.9  HCT 44.0 45.7  MCV 95.0 94.4  PLT 92* 111*   Fasting Lipid Panel:  Lab 11/01/12 0608  CHOL 121  HDL 9*  LDLCALC 54    TRIG 290*  CHOLHDL 13.4  LDLDIRECT --   Urine Drug Screen: Drugs of Abuse     Component Value Date/Time   LABOPIA POSITIVE* 11/01/2012 0306   LABOPIA NEGATIVE 09/18/2011 1655   COCAINSCRNUR NONE DETECTED 11/01/2012 0306   COCAINSCRNUR NEGATIVE 09/18/2011 1655   LABBENZ POSITIVE* 11/01/2012 0306   LABBENZ NEGATIVE 09/18/2011 1655   LABBENZ POS* 05/03/2011 1716   AMPHETMU NONE DETECTED 11/01/2012 0306   AMPHETMU NEG 05/03/2011 1716   THCU NONE DETECTED 11/01/2012 0306   LABBARB NONE DETECTED 11/01/2012 0306   LABBARB NEGATIVE 09/18/2011 1655    Urinalysis:  Lab 10/31/12 1843  COLORURINE YELLOW  LABSPEC 1.015  PHURINE 6.0  GLUCOSEU NEGATIVE  HGBUR SMALL*  BILIRUBINUR NEGATIVE  KETONESUR TRACE*  PROTEINUR 100*  UROBILINOGEN 1.0  NITRITE NEGATIVE  LEUKOCYTESUR NEGATIVE   Studies/Results: US Abdomen Complete  10/31/2012  *RADIOLOGY REPORT*  Clinical Data:  Generalized abdominal pain.  History of alcohol use.  COMPLETE ABDOMINAL ULTRASOUND  Comparison:  None.  Findings:  Gallbladder:  Mildly distended. No shadowing gallstones or echogenic sludge.  No gallbladder wall thickening or pericholecystic fluid.  Negative sonographic Murphy's sign according to the ultrasound technologist.  Common bile duct:  Normal in caliber with maximum diameter approximating 5 mm.  The duct is obscured distally by overlying duodenal bowel gas.  Liver:  Mildly increased and coarsened echotexture diffusely without focal hepatic parenchymal abnormality. Patent portal vein with hepatopetal flow.  IVC:  Patent in its intrahepatic portion.  Obscured outside the liver by midline bowel gas.  Pancreas:  Diffusely hypoechoic.  Mild pancreatic ductal dilation up to 5 mm.  Small amount of peripancreatic fluid.  No focal pancreatic parenchymal mass.  Spleen:  Normal size and echotexture without focal parenchymal abnormality.  Right Kidney:  No hydronephrosis.  Well-preserved cortex.  No shadowing calculi.  Normal size and parenchymal  echotexture without focal abnormalities.  Approximately 11.4 cm in length.  Left Kidney:  No hydronephrosis.  Well-preserved cortex.  No shadowing calculi.  Normal size and parenchymal echotexture without focal abnormalities.  Approximately 10.8 cm in length.  Abdominal aorta:  Normal in caliber in its proximal and mid portions; obscured distally by overlying bowel gas.  IMPRESSION:  1.  Diffuse hepatic steatosis and/or hepatocellular disease without focal hepatic parenchymal abnormality. 2.  Edematous pancreas with a small amount of peripancreatic fluid, consistent with acute pancreatitis.  Mild pancreatic ductal dilation up to 5 mm. 3.  Mildly distended gallbladder without evidence of cholelithiasis or cholecystitis.   Original Report Authenticated By: Hulan Saas, M.D.    Medications: I have reviewed the patient's current medications. Scheduled Meds:   . enoxaparin (LOVENOX) injection  40 mg Subcutaneous Q24H  . folic acid  1 mg Oral Daily  . gemfibrozil  600 mg Oral BID WC  . lisinopril  10 mg Oral Daily   And  . hydrochlorothiazide  12.5 mg Oral Daily  . metoprolol tartrate  25 mg Oral BID  . multivitamin with minerals  1 tablet Oral Daily  . thiamine  100 mg Oral Daily   Or  . thiamine  100 mg Intravenous Daily   Continuous Infusions:   . sodium chloride 150 mL/hr at 11/01/12 0035   PRN Meds:.acetaminophen, acetaminophen, alum & mag hydroxide-simeth, hydrALAZINE, LORazepam, LORazepam, morphine injection, ondansetron (ZOFRAN) IV, ondansetron Assessment/Plan:  HYPOKALEMIA - Likely from decreased PO and some loose bowel movements. He has not had any episodes here and will replete as appropriate.   Leukocytosis - Likely from pancreatitis and will trend for improvement.   HYPERTENSION - BP 124/62 today and will follow closely.   Pancreatitis -Pain is reasonable today and will decrease morphing from 4 mg IV q 4 hr prn to morphine 1-3 mg IV q 4 hours prn. Will advance diet to liquids  today. TG not elevated to point of causing pancreatitis and not likely to have caused it. Likely cause is alcohol and I strongly counseled him to avoid alcohol altogether and specifically the next several weeks after discharge. He is understanding and states he may want to quit altogether.     Dispo: Disposition is deferred at this time, awaiting improvement of current medical problems.  Anticipated discharge in approximately 1-2 day(s).   The patient does have a current PCP (PATEL,RAVI, MD), therefore is requiring OPC follow-up after discharge.   The patient does not have transportation limitations that hinder transportation to clinic appointments.  .Services Needed at time of discharge: Y = Yes, Blank = No PT:   OT:   RN:   Equipment:   Other:     LOS: 1 day   Genella Mech 11/01/2012,  11:11 AM

## 2012-11-01 NOTE — H&P (Signed)
Internal Medicine Teaching Service Attending Note Date: 11/01/2012  Patient name: BENINO KORINEK  Medical record number: [id number]  Date of birth: 1956-07-26   I have seen and evaluated Scheryl Marten and discussed their care with the Residency Team.    Mr. Morissette is a 57yo man who presented to the [facility] ED with epigastric abdominal pain and nausea without vomiting for ~ 24 hours prior to admission.  He has had an episode of pancreatitis before, and this does seem similar to him.  On the day prior to admission, he had an increase in the amount of ETOH he normally drinks because he was celebrating the holidays.  He would not quantify how much he drank, but noted it was "a lot."  He is a daily or every other day drinker of about 2 beers per day.  He describes the pain as sharp, in the middle of his belly, and 10/10 on arrival, however, this has improved somewhat today.  He did have vomiting at home on the morning of admission and this prompted him to seek medical care.  He has not had diarrhea, constipation, recent sick contacts.  He reports being told his cholesterol was high in the past, but he is not on medication for this.  He denies any illicit drugs, but he does say that he takes hydrocodone at home.  He denies every having symptoms of withdrawal from ETOH.  Furtehr denies blood loss in the GI tract, fever, chills, chest pain, weight loss.  He does have a dry cough.   Mr. 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  He is a current every day smoker, ~ 1 ppd  For other PMH, PSH, meds, allergies please see resident note.   Physical Exam: Blood pressure 124/62, pulse 82, temperature 99 F (37.2 C), temperature source Oral, resp. rate 20, height 5\' 7"  (1.702 m), weight 163 lb 9.3 oz (74.2 kg), SpO2 95.00%. General appearance: alert, cooperative, appears stated age and no distress Head: Normocephalic, without obvious  abnormality, atraumatic Eyes: EOMI, anicteric sclerae Lungs: clear to auscultation bilaterally and no wheezing Heart: RR, NR, no murmur Abdomen: TTP diffusely, most severe in the epigastrium, periumbilical region.  +BS, no distention Extremities: thin, no edema Pulses: 2+ and symmetric Skin: no rash, wound  Lab results: Results for orders placed during the hospital encounter of 10/31/12 (from the past 24 hour(s))  CBC WITH DIFFERENTIAL     Status: Abnormal   Collection Time   10/31/12  2:45 PM      Component Value Range   WBC 12.2 (*) 4.0 - 10.5 K/uL   RBC 4.84  4.22 - 5.81 MIL/uL   Hemoglobin 16.9  13.0 - 17.0 g/dL   HCT 95.6  21.3 - 08.6 %   MCV 94.4  78.0 - 100.0 fL   MCH 34.9 (*) 26.0 - 34.0 pg   MCHC 37.0 (*) 30.0 - 36.0 g/dL   RDW 57.8  46.9 - 62.9 %   Platelets 111 (*) 150 - 400 K/uL   Neutrophils Relative 86 (*) 43 - 77 %   Lymphocytes Relative 10 (*) 12 - 46 %   Monocytes Relative 4  3 - 12 %   Eosinophils Relative 0  0 - 5 %   Basophils Relative 0  0 - 1 %   Neutro Abs 10.5 (*) 1.7 - 7.7 K/uL   Lymphs Abs 1.2  0.7 - 4.0 K/uL   Monocytes Absolute 0.5  0.1 - 1.0 K/uL   Eosinophils Absolute 0.0  0.0 - 0.7 K/uL   Basophils Absolute 0.0  0.0 - 0.1 K/uL   Smear Review PLATELET COUNT CONFIRMED BY SMEAR    LIPASE, BLOOD     Status: Abnormal   Collection Time   10/31/12  5:05 PM      Component Value Range   Lipase 976 (*) 11 - 59 U/L  COMPREHENSIVE METABOLIC PANEL     Status: Abnormal   Collection Time   10/31/12  5:05 PM      Component Value Range   Sodium 133 (*) 135 - 145 mEq/L   Potassium 4.7  3.5 - 5.1 mEq/L   Chloride 102  96 - 112 mEq/L   CO2 20  19 - 32 mEq/L   Glucose, Bld 176 (*) 70 - 99 mg/dL   BUN 8  6 - 23 mg/dL   Creatinine, Ser 4.78  0.50 - 1.35 mg/dL   Calcium 8.5  8.4 - 29.5 mg/dL   Total Protein 8.2  6.0 - 8.3 g/dL   Albumin 2.9 (*) 3.5 - 5.2 g/dL   AST 621 (*) 0 - 37 U/L   ALT 47  0 - 53 U/L   Alkaline Phosphatase 110  39 - 117 U/L   Total  Bilirubin 1.1  0.3 - 1.2 mg/dL   GFR calc non Af Amer >90  >90 mL/min   GFR calc Af Amer >90  >90 mL/min  URINALYSIS, ROUTINE W REFLEX MICROSCOPIC     Status: Abnormal   Collection Time   10/31/12  6:43 PM      Component Value Range   Color, Urine YELLOW  YELLOW   APPearance CLEAR  CLEAR   Specific Gravity, Urine 1.015  1.005 - 1.030   pH 6.0  5.0 - 8.0   Glucose, UA NEGATIVE  NEGATIVE mg/dL   Hgb urine dipstick SMALL (*) NEGATIVE   Bilirubin Urine NEGATIVE  NEGATIVE   Ketones, ur TRACE (*) NEGATIVE mg/dL   Protein, ur 308 (*) NEGATIVE mg/dL   Urobilinogen, UA 1.0  0.0 - 1.0 mg/dL   Nitrite NEGATIVE  NEGATIVE   Leukocytes, UA NEGATIVE  NEGATIVE  URINE MICROSCOPIC-ADD ON     Status: Normal   Collection Time   10/31/12  6:43 PM      Component Value Range   Squamous Epithelial / LPF RARE  RARE   WBC, UA 0-2  <3 WBC/hpf   RBC / HPF 0-2  <3 RBC/hpf  URINE RAPID DRUG SCREEN (HOSP PERFORMED)     Status: Abnormal   Collection Time   11/01/12  3:06 AM      Component Value Range   Opiates POSITIVE (*) NONE DETECTED   Cocaine NONE DETECTED  NONE DETECTED   Benzodiazepines POSITIVE (*) NONE DETECTED   Amphetamines NONE DETECTED  NONE DETECTED   Tetrahydrocannabinol NONE DETECTED  NONE DETECTED   Barbiturates NONE DETECTED  NONE DETECTED  COMPREHENSIVE METABOLIC PANEL     Status: Abnormal   Collection Time   11/01/12  6:08 AM      Component Value Range   Sodium 136  135 - 145 mEq/L   Potassium 3.1 (*) 3.5 - 5.1 mEq/L   Chloride 105  96 - 112 mEq/L   CO2 17 (*) 19 - 32 mEq/L   Glucose, Bld 168 (*) 70 - 99 mg/dL   BUN 9  6 - 23 mg/dL  Creatinine, Ser 0.91  0.50 - 1.35 mg/dL   Calcium 7.9 (*) 8.4 - 10.5 mg/dL   Total Protein 7.1  6.0 - 8.3 g/dL   Albumin 2.4 (*) 3.5 - 5.2 g/dL   AST 69 (*) 0 - 37 U/L   ALT 33  0 - 53 U/L   Alkaline Phosphatase 87  39 - 117 U/L   Total Bilirubin 1.2  0.3 - 1.2 mg/dL   GFR calc non Af Amer >90  >90 mL/min   GFR calc Af Amer >90  >90 mL/min  CBC      Status: Abnormal   Collection Time   11/01/12  6:08 AM      Component Value Range   WBC 17.4 (*) 4.0 - 10.5 K/uL   RBC 4.63  4.22 - 5.81 MIL/uL   Hemoglobin 16.1  13.0 - 17.0 g/dL   HCT 09.8  11.9 - 14.7 %   MCV 95.0  78.0 - 100.0 fL   MCH 34.8 (*) 26.0 - 34.0 pg   MCHC 36.6 (*) 30.0 - 36.0 g/dL   RDW 82.9  56.2 - 13.0 %   Platelets 92 (*) 150 - 400 K/uL  LIPID PANEL     Status: Abnormal   Collection Time   11/01/12  6:08 AM      Component Value Range   Cholesterol 121  0 - 200 mg/dL   Triglycerides 865 (*) <150 mg/dL   HDL 9 (*) >78 mg/dL   Total CHOL/HDL Ratio 13.4     VLDL 58 (*) 0 - 40 mg/dL   LDL Cholesterol 54  0 - 99 mg/dL    Imaging results:  US Abdomen Complete  10/31/2012  *RADIOLOGY REPORT*  Clinical Data:  Generalized abdominal pain.  History of alcohol use.  COMPLETE ABDOMINAL ULTRASOUND  Comparison:  None.  Findings:  Gallbladder:  Mildly distended. No shadowing gallstones or echogenic sludge.  No gallbladder wall thickening or pericholecystic fluid.  Negative sonographic Murphy's sign according to the ultrasound technologist.  Common bile duct:  Normal in caliber with maximum diameter approximating 5 mm.  The duct is obscured distally by overlying duodenal bowel gas.  Liver:  Mildly increased and coarsened echotexture diffusely without focal hepatic parenchymal abnormality. Patent portal vein with hepatopetal flow.  IVC:  Patent in its intrahepatic portion.  Obscured outside the liver by midline bowel gas.  Pancreas:  Diffusely hypoechoic.  Mild pancreatic ductal dilation up to 5 mm.  Small amount of peripancreatic fluid.  No focal pancreatic parenchymal mass.  Spleen:  Normal size and echotexture without focal parenchymal abnormality.  Right Kidney:  No hydronephrosis.  Well-preserved cortex.  No shadowing calculi.  Normal size and parenchymal echotexture without focal abnormalities.  Approximately 11.4 cm in length.  Left Kidney:  No hydronephrosis.  Well-preserved cortex.  No  shadowing calculi.  Normal size and parenchymal echotexture without focal abnormalities.  Approximately 10.8 cm in length.  Abdominal aorta:  Normal in caliber in its proximal and mid portions; obscured distally by overlying bowel gas.  IMPRESSION:  1.  Diffuse hepatic steatosis and/or hepatocellular disease without focal hepatic parenchymal abnormality. 2.  Edematous pancreas with a small amount of peripancreatic fluid, consistent with acute pancreatitis.  Mild pancreatic ductal dilation up to 5 mm. 3.  Mildly distended gallbladder without evidence of cholelithiasis or cholecystitis.   Original Report Authenticated By: Hulan Saas, M.D.     Assessment and Plan: I agree with the formulated Assessment and Plan with the following changes:  1. Acute pancreatitis 2/2 ETOH - TG only 290, US showed no gallstones, ETOH most likely - NPO, transition to clears as tolerated - IVF - Nausea/pain control with zofran/morphine - Monitor for improvement and advance diet as tolerated  2. ETOH abuse - CIWA protocol for possible withdrawal.   3. Malignant HTN - Patient with acute elevation of blood pressure in the ED - Will check UDS, he denies cocaine use, but has a history - IV hydralazine while NPO, transition to PO when tolerated.   Other issues as per resident note.   Inez Catalina, MD 1/5/201412:03 PM

## 2012-11-02 DIAGNOSIS — K859 Acute pancreatitis without necrosis or infection, unspecified: Principal | ICD-10-CM

## 2012-11-02 DIAGNOSIS — D72829 Elevated white blood cell count, unspecified: Secondary | ICD-10-CM

## 2012-11-02 DIAGNOSIS — I10 Essential (primary) hypertension: Secondary | ICD-10-CM

## 2012-11-02 DIAGNOSIS — E876 Hypokalemia: Secondary | ICD-10-CM

## 2012-11-02 LAB — LIPASE, BLOOD: Lipase: 526 U/L — ABNORMAL HIGH (ref 11–59)

## 2012-11-02 MED ORDER — SENNOSIDES-DOCUSATE SODIUM 8.6-50 MG PO TABS
1.0000 | ORAL_TABLET | Freq: Two times a day (BID) | ORAL | Status: DC
  Administered 2012-11-02 (×2): 1 via ORAL
  Filled 2012-11-02 (×3): qty 1

## 2012-11-02 MED ORDER — ZOLPIDEM TARTRATE 5 MG PO TABS
5.0000 mg | ORAL_TABLET | Freq: Every evening | ORAL | Status: DC | PRN
  Administered 2012-11-02: 5 mg via ORAL
  Filled 2012-11-02: qty 1

## 2012-11-02 NOTE — Progress Notes (Signed)
Subjective: He is still nauseated but tried clear liquids this morning and is tolerating them well so far. He reports not having a BM in 3 days.   He denies chest pain or shortness of breath.   Objective: Vital signs in last 24 hours: Filed Vitals:   11/01/12 1809 11/01/12 2131 11/02/12 0014 11/02/12 0509  BP: 171/101 167/91 153/89 162/87  Pulse: 85 95 95 75  Temp: 99.2 F (37.3 C) 99.6 F (37.6 C) 99.2 F (37.3 C) 99.9 F (37.7 C)  TempSrc: Oral Oral Oral Oral  Resp: 20 18 20 18   Height:      Weight:      SpO2: 96% 94% 95% 95%   Weight change:   Intake/Output Summary (Last 24 hours) at 11/02/12 0841 Last data filed at 11/02/12 0700  Gross per 24 hour  Intake 3892.5 ml  Output    350 ml  Net 3542.5 ml   Vitals reviewed. General: resting in bed, in NAD HEENT: mild scleral icterus Cardiac: RRR, no rubs, murmurs or gallops Pulm: clear to auscultation bilaterally, no wheezes, rales, or rhonchi Abd: distended, diffusely tender with pain worse at the epigastric area, no rebound tenderness, no guarding, BS present Ext: warm and well perfused, no pedal edema Neuro: alert and oriented X3, cranial nerves II-XII grossly intact, no asterixis.  Lab Results: Basic Metabolic Panel:  Lab 11/01/12 1610 10/31/12 1705  NA 136 133*  K 3.1* 4.7  CL 105 102  CO2 17* 20  GLUCOSE 168* 176*  BUN 9 8  CREATININE 0.91 0.65  CALCIUM 7.9* 8.5  MG -- --  PHOS -- --   Liver Function Tests:  Lab 11/01/12 0608 10/31/12 1705  AST 69* 107*  ALT 33 47  ALKPHOS 87 110  BILITOT 1.2 1.1  PROT 7.1 8.2  ALBUMIN 2.4* 2.9*    Lab 10/31/12 1705  LIPASE 976*  AMYLASE --   CBC:  Lab 11/01/12 0608 10/31/12 1445  WBC 17.4* 12.2*  NEUTROABS -- 10.5*  HGB 16.1 16.9  HCT 44.0 45.7  MCV 95.0 94.4  PLT 92* 111*   Fasting Lipid Panel:  Lab 11/01/12 0608  CHOL 121  HDL 9*  LDLCALC 54  TRIG 290*  CHOLHDL 13.4  LDLDIRECT --   Urine Drug Screen: Drugs of Abuse     Component Value  Date/Time   LABOPIA POSITIVE* 11/01/2012 0306   LABOPIA NEGATIVE 09/18/2011 1655   COCAINSCRNUR NONE DETECTED 11/01/2012 0306   COCAINSCRNUR NEGATIVE 09/18/2011 1655   LABBENZ POSITIVE* 11/01/2012 0306   LABBENZ NEGATIVE 09/18/2011 1655   LABBENZ POS* 05/03/2011 1716   AMPHETMU NONE DETECTED 11/01/2012 0306   AMPHETMU NEG 05/03/2011 1716   THCU NONE DETECTED 11/01/2012 0306   LABBARB NONE DETECTED 11/01/2012 0306   LABBARB NEGATIVE 09/18/2011 1655    Urinalysis:  Lab 10/31/12 1843  COLORURINE YELLOW  LABSPEC 1.015  PHURINE 6.0  GLUCOSEU NEGATIVE  HGBUR SMALL*  BILIRUBINUR NEGATIVE  KETONESUR TRACE*  PROTEINUR 100*  UROBILINOGEN 1.0  NITRITE NEGATIVE  LEUKOCYTESUR NEGATIVE   Studies/Results: US Abdomen Complete  10/31/2012  *RADIOLOGY REPORT*  Clinical Data:  Generalized abdominal pain.  History of alcohol use.  COMPLETE ABDOMINAL ULTRASOUND  Comparison:  None.  Findings:  Gallbladder:  Mildly distended. No shadowing gallstones or echogenic sludge.  No gallbladder wall thickening or pericholecystic fluid.  Negative sonographic Murphy's sign according to the ultrasound technologist.  Common bile duct:  Normal in caliber with maximum diameter approximating 5 mm.  The duct is obscured  distally by overlying duodenal bowel gas.  Liver:  Mildly increased and coarsened echotexture diffusely without focal hepatic parenchymal abnormality. Patent portal vein with hepatopetal flow.  IVC:  Patent in its intrahepatic portion.  Obscured outside the liver by midline bowel gas.  Pancreas:  Diffusely hypoechoic.  Mild pancreatic ductal dilation up to 5 mm.  Small amount of peripancreatic fluid.  No focal pancreatic parenchymal mass.  Spleen:  Normal size and echotexture without focal parenchymal abnormality.  Right Kidney:  No hydronephrosis.  Well-preserved cortex.  No shadowing calculi.  Normal size and parenchymal echotexture without focal abnormalities.  Approximately 11.4 cm in length.  Left Kidney:  No  hydronephrosis.  Well-preserved cortex.  No shadowing calculi.  Normal size and parenchymal echotexture without focal abnormalities.  Approximately 10.8 cm in length.  Abdominal aorta:  Normal in caliber in its proximal and mid portions; obscured distally by overlying bowel gas.  IMPRESSION:  1.  Diffuse hepatic steatosis and/or hepatocellular disease without focal hepatic parenchymal abnormality. 2.  Edematous pancreas with a small amount of peripancreatic fluid, consistent with acute pancreatitis.  Mild pancreatic ductal dilation up to 5 mm. 3.  Mildly distended gallbladder without evidence of cholelithiasis or cholecystitis.   Original Report Authenticated By: Hulan Saas, M.D.    Medications: I have reviewed the patient's current medications. Scheduled Meds:    . enoxaparin (LOVENOX) injection  40 mg Subcutaneous Q24H  . folic acid  1 mg Oral Daily  . gemfibrozil  600 mg Oral BID WC  . lisinopril  10 mg Oral Daily   And  . hydrochlorothiazide  12.5 mg Oral Daily  . metoprolol tartrate  25 mg Oral BID  . multivitamin with minerals  1 tablet Oral Daily  . senna-docusate  1 tablet Oral BID  . thiamine  100 mg Oral Daily   Continuous Infusions:    . sodium chloride 150 mL/hr at 11/02/12 0335   PRN Meds:.acetaminophen, acetaminophen, alum & mag hydroxide-simeth, hydrALAZINE, LORazepam, LORazepam, morphine injection, ondansetron (ZOFRAN) IV, ondansetron, zolpidem Assessment/Plan: Pancreatitis - His pain is slowly improving. He tolerated clear liquids well yesterday. TG not elevated to point of causing pancreatitis and not likely to have caused it. Likely cause is alcohol and he was strongly counseled to avoid alcohol altogether and specifically the next several weeks after discharge. He is understanding and states he may want to quit altogether.  -CMP, lipase -Regular diet today -Continue Zofran PRN -Senna-docusate for constipation -Will transition IV pain meds to PO meds once he is  tolerating a regular diet well  HYPOKALEMIA - Likely from decreased PO and some loose bowel movements. He has not had any episodes here and will replete as appropriate.   Leukocytosis - Likely from pancreatitis and will trend for improvement.  -CBC daily  HYPERTENSION - BP 162/87 today and will follow closely.  -Restarted Metoprolol 25 mg -Restarted Lisinopril 10mg  and HCTZ 12.5mg   Dispo: Disposition is deferred at this time, awaiting improvement of current medical problems. Anticipated discharge in approximately 1-2 day(s).  The patient does have a current PCP (PATEL,RAVI, MD), therefore is requiring OPC follow-up after discharge.  The patient does not have transportation limitations that hinder transportation to clinic appointments.  .Services Needed at time of discharge: Y = Yes, Blank = No  PT:    OT:    RN:    Equipment:    Other:       Dispo: Disposition is deferred at this time, awaiting improvement of current medical problems.  Anticipated  discharge in approximately 1-2 day(s).   The patient does have a current PCP (PATEL,RAVI, MD), therefore will not be requiring OPC follow-up after discharge.   The patient does have transportation limitations that hinder transportation to clinic appointments.  .Services Needed at time of discharge: Y = Yes, Blank = No PT:   OT:   RN:   Equipment:   Other:     LOS: 2 days   Sara Chu D 11/02/2012, 8:41 AM

## 2012-11-02 NOTE — Progress Notes (Signed)
Internal Medicine Teaching Service Attending Note Date: 11/02/2012  Patient name: [name]  Medical record number: [id number]  Date of birth: 1955-12-04    This patient has been seen and discussed with the house staff. Please see their note for complete details. I concur with their findings with the following additions/corrections: Patient is making small progress. He has started tolerating liquid diet well. We will advance diet. Patient was counseled extensively about alcohol cessation and changing his lifestyle to avoid future such episodes.  Lars Mage 11/02/2012, 11:59 AM

## 2012-11-03 LAB — CBC
Hemoglobin: 13.3 g/dL (ref 13.0–17.0)
MCH: 33.9 pg (ref 26.0–34.0)
MCHC: 34.9 g/dL (ref 30.0–36.0)
MCV: 97.2 fL (ref 78.0–100.0)
RBC: 3.92 MIL/uL — ABNORMAL LOW (ref 4.22–5.81)

## 2012-11-03 LAB — COMPREHENSIVE METABOLIC PANEL
ALT: 19 U/L (ref 0–53)
AST: 39 U/L — ABNORMAL HIGH (ref 0–37)
Albumin: 2.4 g/dL — ABNORMAL LOW (ref 3.5–5.2)
CO2: 19 mEq/L (ref 19–32)
Calcium: 8.4 mg/dL (ref 8.4–10.5)
Chloride: 99 mEq/L (ref 96–112)
Creatinine, Ser: 0.87 mg/dL (ref 0.50–1.35)
GFR calc non Af Amer: >90 mL/min (ref >90)
Sodium: 130 mEq/L — ABNORMAL LOW (ref 135–145)
Total Bilirubin: 1.7 mg/dL — ABNORMAL HIGH (ref 0.3–1.2)

## 2012-11-03 MED ORDER — POTASSIUM CHLORIDE CRYS ER 20 MEQ PO TBCR: 40.0000 meq | EXTENDED_RELEASE_TABLET | Freq: Once | ORAL | Status: DC

## 2012-11-03 MED ORDER — FOLIC ACID 1 MG PO TABS: 1.0000 mg | ORAL_TABLET | Freq: Every day | ORAL | Status: DC

## 2012-11-03 MED ORDER — ONDANSETRON HCL 4 MG PO TABS: 4.0000 mg | ORAL_TABLET | Freq: Four times a day (QID) | ORAL | Status: DC | PRN

## 2012-11-03 MED ORDER — HYDROCODONE-ACETAMINOPHEN 5-325 MG PO TABS
1.0000 | ORAL_TABLET | Freq: Four times a day (QID) | ORAL | Status: DC | PRN
Start: 2012-11-03 — End: 2013-06-16

## 2012-11-03 MED ORDER — HYDROCODONE-ACETAMINOPHEN 5-325 MG PO TABS
1.0000 | ORAL_TABLET | Freq: Four times a day (QID) | ORAL | Status: DC | PRN
  Administered 2012-11-03: 2 via ORAL
  Filled 2012-11-03: qty 2

## 2012-11-03 MED ORDER — DOCUSATE SODIUM 100 MG PO CAPS: 100.0000 mg | ORAL_CAPSULE | Freq: Two times a day (BID) | ORAL | Status: DC

## 2012-11-03 MED ORDER — ENOXAPARIN SODIUM 40 MG/0.4ML ~~LOC~~ SOLN: 40.0000 mg | SUBCUTANEOUS | Status: DC

## 2012-11-03 MED ORDER — ADULT MULTIVITAMIN W/MINERALS CH: 1.0000 | ORAL_TABLET | Freq: Every day | ORAL | Status: DC

## 2012-11-03 NOTE — Progress Notes (Signed)
Subjective: He feels much better and tolerated breakfast well today. He had three BMs overnight.   She denies chest pain, cough, abdominal pain, nausea, vomiting, or dysuria.  Objective: Vital signs in last 24 hours: Filed Vitals:   11/02/12 2041 11/03/12 0537 11/03/12 0544 11/03/12 0920  BP: 168/92  162/85   Pulse: 93 84  85  Temp: 98.4 F (36.9 C) 100.1 F (37.8 C)    TempSrc: Oral Oral    Resp: 18 16    Height:      Weight:      SpO2: 95% 93%     Weight change:   Intake/Output Summary (Last 24 hours) at 11/03/12 1344 Last data filed at 11/03/12 1025  Gross per 24 hour  Intake   2485 ml  Output   2150 ml  Net    335 ml   Vitals reviewed.  General: resting in bed, in NAD  HEENT: mild scleral icterus  Cardiac: RRR, no rubs, murmurs or gallops  Pulm: clear to auscultation bilaterally, no wheezes, rales, or rhonchi  Abd: distended, diffusely tender with pain worse at the epigastric area, no rebound tenderness, no guarding, BS present  Ext: warm and well perfused, no pedal edema  Neuro: alert and oriented X3, cranial nerves II-XII grossly intact, no asterixis.   Lab Results: Basic Metabolic Panel:  Lab 11/03/12 6295 11/01/12 0608  NA 130* 136  K 3.4* 3.1*  CL 99 105  CO2 19 17*  GLUCOSE 100* 168*  BUN 11 9  CREATININE 0.87 0.91  CALCIUM 8.4 7.9*  MG -- --  PHOS -- --   Liver Function Tests:  Lab 11/03/12 0445 11/01/12 0608  AST 39* 69*  ALT 19 33  ALKPHOS 90 87  BILITOT 1.7* 1.2  PROT 6.7 7.1  ALBUMIN 2.4* 2.4*    Lab 11/02/12 0913 10/31/12 1705  LIPASE 526* 976*  AMYLASE -- --   CBC:  Lab 11/03/12 0445 11/01/12 0608 10/31/12 1445  WBC 18.1* 17.4* --  NEUTROABS -- -- 10.5*  HGB 13.3 16.1 --  HCT 38.1* 44.0 --  MCV 97.2 95.0 --  PLT 70* 92* --   Fasting Lipid Panel:  Lab 11/01/12 0608  CHOL 121  HDL 9*  LDLCALC 54  TRIG 290*  CHOLHDL 13.4  LDLDIRECT --    Urine Drug Screen: Drugs of Abuse     Component Value Date/Time   LABOPIA  POSITIVE* 11/01/2012 0306   LABOPIA NEGATIVE 09/18/2011 1655   COCAINSCRNUR NONE DETECTED 11/01/2012 0306   COCAINSCRNUR NEGATIVE 09/18/2011 1655   LABBENZ POSITIVE* 11/01/2012 0306   LABBENZ NEGATIVE 09/18/2011 1655   LABBENZ POS* 05/03/2011 1716   AMPHETMU NONE DETECTED 11/01/2012 0306   AMPHETMU NEG 05/03/2011 1716   THCU NONE DETECTED 11/01/2012 0306   LABBARB NONE DETECTED 11/01/2012 0306   LABBARB NEGATIVE 09/18/2011 1655    Urinalysis:  Lab 10/31/12 1843  COLORURINE YELLOW  LABSPEC 1.015  PHURINE 6.0  GLUCOSEU NEGATIVE  HGBUR SMALL*  BILIRUBINUR NEGATIVE  KETONESUR TRACE*  PROTEINUR 100*  UROBILINOGEN 1.0  NITRITE NEGATIVE  LEUKOCYTESUR NEGATIVE   Medications: I have reviewed the patient's current medications. Scheduled Meds:   . enoxaparin (LOVENOX) injection  40 mg Subcutaneous Q24H  . folic acid  1 mg Oral Daily  . gemfibrozil  600 mg Oral BID WC  . lisinopril  10 mg Oral Daily   And  . hydrochlorothiazide  12.5 mg Oral Daily  . metoprolol tartrate  25 mg Oral BID  . multivitamin  with minerals  1 tablet Oral Daily  . senna-docusate  1 tablet Oral BID  . thiamine  100 mg Oral Daily   Continuous Infusions:   . sodium chloride 150 mL/hr at 11/03/12 0540   PRN Meds:.acetaminophen, acetaminophen, alum & mag hydroxide-simeth, HYDROcodone-acetaminophen, ondansetron (ZOFRAN) IV, ondansetron, zolpidem Assessment/Plan: Pancreatitis - His pain is much improved today. He tolerated a full diet today. His lipase was 976 but trended down to 526 yesterday.TG not elevated to point of causing pancreatitis and not likely to have caused it. Likely cause is alcohol and he was strongly counseled to avoid alcohol altogether and specifically the next several weeks after discharge. He is understanding and states he may want to quit altogether.  -CMP, -Regular diet today  -Continue Zofran PRN  -Discontinued Senna-docusate for constipation, will keep him on docusate while he is on pain meds -PO  meds for pain once he is tolerating a regular diet well   HYPOKALEMIA - Likely from decreased PO and some loose bowel movements. He has not had any episodes here and will replete as appropriate. K of 3.4 today, K-Dur once.   Leukocytosis - Likely from pancreatitis, he denies cough, shortness of breath, or dysuria. He will need repeat CBC as outpatient.   HYPERTENSION - BP 162/85.  -Restarted Metoprolol 25 mg  -Restarted Lisinopril 10mg  and HCTZ 12.5mg   Dispo: Disposition is deferred at this time, awaiting improvement of current medical problems. He will be discharged today.   The patient does have a current PCP (PATEL,RAVI, MD), therefore is requiring OPC follow-up after discharge.  The patient does not have transportation limitations that hinder transportation to clinic appointments.  .Services Needed at time of discharge: Y = Yes, Blank = No  PT:    OT:    RN:    Equipment:    Other:         LOS: 3 days   Sara Chu D 11/03/2012, 1:44 PM

## 2012-11-03 NOTE — Progress Notes (Signed)
Pt. discharged to floor,verbalized understanding of discharged instruction,medication,restriction,diet and follow up appointment.Baseline Vitals sign stable,Pt comfortable,no sign and symptom of distress. 

## 2012-11-03 NOTE — Progress Notes (Signed)
Utilization review complete 

## 2012-11-03 NOTE — Discharge Summary (Signed)
Internal Medicine Teaching [facility] Discharge Note  Name: [name] MRN: [id number] DOB: 10-18-1956 57 y.o.  Date of Admission: 10/31/2012  2:12 PM Date of Discharge: 11/03/2012 Attending Physician: Brian Mage, MD  Discharge Diagnosis: Active Problems:  HYPOKALEMIA  LEUKOCYTOSIS UNSPECIFIED  HYPERTENSION  Pancreatitis   Discharge Medications:   Medication List     As of 11/03/2012  2:02 PM    TAKE these medications         ALPRAZolam 1 MG tablet   Commonly known as: XANAX   Take 0.5 mg by mouth at bedtime as needed. Anxiety.Take 1/2  Tablet as needed twice a day and one tablet at bedtime.      aspirin 81 MG EC tablet   Take 81 mg by mouth daily.      docusate sodium 100 MG capsule   Commonly known as: COLACE   Take 1 capsule (100 mg total) by mouth 2 (two) times daily.      enoxaparin 40 MG/0.4ML injection   Commonly known as: LOVENOX   Inject 0.4 mLs (40 mg total) into the skin daily.      folic acid 1 MG tablet   Commonly known as: FOLVITE   Take 1 tablet (1 mg total) by mouth daily.      HYDROcodone-acetaminophen 5-325 MG per tablet   Commonly known as: NORCO/VICODIN   Take 1-2 tablets by mouth every 6 (six) hours as needed.      lisinopril-hydrochlorothiazide 10-12.5 MG per tablet   Commonly known as: PRINZIDE,ZESTORETIC   Take 1 tablet by mouth daily.      LOPID 600 MG tablet   Generic drug: gemfibrozil   Take 600 mg by mouth 2 (two) times daily.      metoprolol tartrate 25 MG tablet   Commonly known as: LOPRESSOR   Take 25 mg by mouth 2 (two) times daily.      multivitamin with minerals Tabs   Take 1 tablet by mouth daily.      ondansetron 4 MG tablet   Commonly known as: ZOFRAN   Take 1 tablet (4 mg total) by mouth every 6 (six) hours as needed for nausea.          Disposition and follow-up:   [name] was discharged from [facility] in stable condition.  At the hospital follow up visit please  address: -Resolution of his pancreatitis with associated epigastric pain--he was given Vicodin 5mg  #10 on discharge -Further counsel patient on alcohol abuse -He may need repeat BMET for potassium trending and CBC for WBC trending (elevated to 18 on discharge with no fever, no source of infection) -He may need increase in his BP medications   Follow-up Appointments: Follow-up Information    Follow up with Brian Harp, MD. On 11/17/2012. (Please arrive at internal medicine clinic at 9:30 AM)    Contact information:   [address] [place] [address] [phone]         Discharge Orders    Future Appointments: Provider: Department: Dept Phone: Center:   11/17/2012 9:45 AM Brian Harp, MD [facility] [phone] [facility]     Future Orders Please Complete By Expires   Diet - low sodium heart healthy      Increase activity slowly         Consultations: None  Procedures Performed:  US Abdomen Complete  10/31/2012  *RADIOLOGY REPORT*  Clinical Data:  Generalized abdominal pain.  History of  alcohol use.  COMPLETE ABDOMINAL ULTRASOUND  Comparison:  None.  Findings:  Gallbladder:  Mildly distended. No shadowing gallstones or echogenic sludge.  No gallbladder wall thickening or pericholecystic fluid.  Negative sonographic Murphy's sign according to the ultrasound technologist.  Common bile duct:  Normal in caliber with maximum diameter approximating 5 mm.  The duct is obscured distally by overlying duodenal bowel gas.  Liver:  Mildly increased and coarsened echotexture diffusely without focal hepatic parenchymal abnormality. Patent portal vein with hepatopetal flow.  IVC:  Patent in its intrahepatic portion.  Obscured outside the liver by midline bowel gas.  Pancreas:  Diffusely hypoechoic.  Mild pancreatic ductal dilation up to 5 mm.  Small amount of peripancreatic fluid.  No focal pancreatic parenchymal mass.  Spleen:  Normal size and echotexture without focal  parenchymal abnormality.  Right Kidney:  No hydronephrosis.  Well-preserved cortex.  No shadowing calculi.  Normal size and parenchymal echotexture without focal abnormalities.  Approximately 11.4 cm in length.  Left Kidney:  No hydronephrosis.  Well-preserved cortex.  No shadowing calculi.  Normal size and parenchymal echotexture without focal abnormalities.  Approximately 10.8 cm in length.  Abdominal aorta:  Normal in caliber in its proximal and mid portions; obscured distally by overlying bowel gas.  IMPRESSION:  1.  Diffuse hepatic steatosis and/or hepatocellular disease without focal hepatic parenchymal abnormality. 2.  Edematous pancreas with a small amount of peripancreatic fluid, consistent with acute pancreatitis.  Mild pancreatic ductal dilation up to 5 mm. 3.  Mildly distended gallbladder without evidence of cholelithiasis or cholecystitis.   Original Report Authenticated By: Hulan Saas, M.D.     Admission HPI:  [name] 57yo AA man pmh of RA, HTN, anxiety, polysubstance (cocaine/EtOH) and illegal narcotic distribution behavior presented with intense epigastric abdominal pain with nausea and vomiting for past 24 hrs. Pt has had a remote hx of pancreatitis in the past and felt pain was similar. Pt indulges daily with alcohol mostly beer but wouldn't quantify amount. Pt states he yielded to many offers for increased alcohol consumption for holiday celebration with his last consumption being late yesterday evening. Pt feels this may have triggered abdominal pain. Pt denied ever having seizures, blackouts in the past 2/2 alcohol withdrawal. Pt has hx of illegal selling/distribution of pain medication with violation of pain contract in 2012 but states has been attending and receiving pain medication from a pain clinic (there are no records of these visits per chart review). Pt took extra pain medication this AM but that didn't alleviate the pain. Pt presented to ED 2/2 vomiting that exacerbated the  pain and inability to take home meds. Pt is a daily smoker 1ppd with 30+ pack year but denied any other illicit drugs. Pt also states is recently recovering from a URI and has a residual cough. No diarrhea/hematochezia/hemetemesis, fevers/chills, or weight loss. Pt last BM this AM and able to pass flatus.  Hospital Course by problem list: Pancreatitis - His pain is much improved today. He tolerated a full diet today. His lipase was 976 but trended down to 526 yesterday.TG not elevated to point of causing pancreatitis and not likely to have caused it. Likely cause is alcohol and he was strongly counseled to avoid alcohol altogether and specifically the next several weeks after discharge. He is understanding and states he may want to quit altogether. His pain was well controlled with IV morphine but he will be discharged with Vicodin for outpatient pain control. He was counseled to abstain from alcohol  while he fully recovers from pancreatitis.   HYPOKALEMIA - Likely from decreased PO and some loose bowel movements. He has not had any episodes here and will replete as appropriate. K of 3.4 today, K-Dur once. He will need repeat BMET as outpatient.   Leukocytosis - Likely from pancreatitis, he denies cough, shortness of breath, or dysuria. He will need repeat CBC as outpatient.   HYPERTENSION - BP 162/85 today. We Restarted Metoprolol 25 mg, Lisinopril 10mg  and HCTZ 12.5mg . He will need reassessment of his BP and may need readjustment of his medications as outpatient.   Discharge Vitals:  BP 162/85  Pulse 85  Temp 100.1 F (37.8 C) (Oral)  Resp 16  Ht 5\' 7"  (1.702 m)  Wt 163 lb 9.3 oz (74.2 kg)  BMI 25.62 kg/m2  SpO2 93%  Discharge Labs:  Results for orders placed during the hospital encounter of 10/31/12 (from the past 24 hour(s))  COMPREHENSIVE METABOLIC PANEL     Status: Abnormal   Collection Time   11/03/12  4:45 AM      Component Value Range   Sodium 130 (*) 135 - 145 mEq/L    Potassium 3.4 (*) 3.5 - 5.1 mEq/L   Chloride 99  96 - 112 mEq/L   CO2 19  19 - 32 mEq/L   Glucose, Bld 100 (*) 70 - 99 mg/dL   BUN 11  6 - 23 mg/dL   Creatinine, Ser 1.61  0.50 - 1.35 mg/dL   Calcium 8.4  8.4 - 09.6 mg/dL   Total Protein 6.7  6.0 - 8.3 g/dL   Albumin 2.4 (*) 3.5 - 5.2 g/dL   AST 39 (*) 0 - 37 U/L   ALT 19  0 - 53 U/L   Alkaline Phosphatase 90  39 - 117 U/L   Total Bilirubin 1.7 (*) 0.3 - 1.2 mg/dL   GFR calc non Af Amer >90  >90 mL/min   GFR calc Af Amer >90  >90 mL/min  CBC     Status: Abnormal   Collection Time   11/03/12  4:45 AM      Component Value Range   WBC 18.1 (*) 4.0 - 10.5 K/uL   RBC 3.92 (*) 4.22 - 5.81 MIL/uL   Hemoglobin 13.3  13.0 - 17.0 g/dL   HCT 04.5 (*) 40.9 - 81.1 %   MCV 97.2  78.0 - 100.0 fL   MCH 33.9  26.0 - 34.0 pg   MCHC 34.9  30.0 - 36.0 g/dL   RDW 91.4  78.2 - 95.6 %   Platelets 70 (*) 150 - 400 K/uL    Signed: Sara Chu D 11/03/2012, 2:02 PM   Time Spent on Discharge: 30 minutes Services Ordered on Discharge: None  Equipment Ordered on Discharge: None

## 2012-11-17 ENCOUNTER — Encounter: Payer: Medicaid Other | Admitting: Internal Medicine

## 2012-11-24 ENCOUNTER — Ambulatory Visit (INDEPENDENT_AMBULATORY_CARE_PROVIDER_SITE_OTHER): Payer: Medicaid Other | Admitting: Internal Medicine

## 2012-11-24 ENCOUNTER — Encounter: Payer: Self-pay | Admitting: Internal Medicine

## 2012-11-24 VITALS — BP 142/72 | HR 71 | Temp 97.8°F | Ht 67.0 in | Wt 162.9 lb

## 2012-11-24 DIAGNOSIS — K859 Acute pancreatitis without necrosis or infection, unspecified: Secondary | ICD-10-CM

## 2012-11-24 DIAGNOSIS — J329 Chronic sinusitis, unspecified: Secondary | ICD-10-CM

## 2012-11-24 DIAGNOSIS — Z0000 Encounter for general adult medical examination without abnormal findings: Secondary | ICD-10-CM

## 2012-11-24 DIAGNOSIS — N400 Enlarged prostate without lower urinary tract symptoms: Secondary | ICD-10-CM | POA: Insufficient documentation

## 2012-11-24 DIAGNOSIS — F329 Major depressive disorder, single episode, unspecified: Secondary | ICD-10-CM

## 2012-11-24 DIAGNOSIS — I10 Essential (primary) hypertension: Secondary | ICD-10-CM

## 2012-11-24 MED ORDER — TAMSULOSIN HCL 0.4 MG PO CAPS: 0.4000 mg | ORAL_CAPSULE | Freq: Every day | ORAL | Status: DC

## 2012-11-24 NOTE — Patient Instructions (Signed)
1. Please start taking Flomax for for your difficult urination. This medication may also help to control your blood pressure. 2. it is extremely important to not drink alcohol again for your pancreatitis. 3. If you have worsening of your symptoms or new symptoms arise, please call the clinic ([phone]), or go to the ER immediately if symptoms are severe.   Benign Prostatic Hyperplasia You have an enlarged prostate. This is common in elderly males. It is called BPH. This stands for benign prostate hyperplasia. The prostate gland is located in base of the bladder. When it grows, the prostate blocks the urethra. This is the tube which drains urine from the bladder.  SYMPTOMS  Weak urine stream.  Dribbling.  Feeling like the bladder has not emptied completely.  Difficulty starting urination.  Getting up frequently at night to urinate.  Urinating more frequently during the day. Complete urinary blockage or severe pain with urination requires immediate attention. DIAGNOSIS   Your caregiver often has a good idea what is wrong by taking a history and doing a physical exam.  Special x-rays may be done. TREATMENT   For mild problems, no treatment may be necessary.  If the problems are moderate, medications may provide relief. Some of these work by making the prostate gland smaller. The herb saw palmetto is commonly used.  If complete blockage occurs, a Foley catheter is usually left in place for a few days.  Surgery is often needed for more severe problems. TURP is the prostate surgery for BPH which is done through the urethra. TURP stands for transurethral resection of the prostate. It involves cutting away chips from the prostate. It is done by removing chips so that they can come out through the penis.  Techniques using heat, microwave and laser to remove the prostate blockage are also being used. HOME CARE INSTRUCTIONS   Give yourself time when you urinate.  Stay away from  alcohol.  Beverages containing caffeine such as coffee, tea and colas can make the problems worse.  Decongestants, antihistamines, and some prescription medicines can also make the problem worse.  Follow up with your caregiver for further treatment as recommended. SEEK IMMEDIATE MEDICAL CARE IF:   You develop increased pain with urination or are unable to pass your water.  You develop severe abdominal pain, vomiting, a high fever, or fainting.  You develop back pain or blood in your urine. MAKE SURE YOU:   Understand these instructions.  Will watch your condition.  Will get help right away if you are not doing well or get worse. Document Released: 10/14/2005 Document Revised: 01/06/2012 Document Reviewed: 06/19/2007 [facility] Patient Information 2013 [place], [place].

## 2012-11-24 NOTE — Assessment & Plan Note (Signed)
Patient is still have moderate abdominal pain which is controlled by Norco. He does not have nausea, vomiting, diarrhea or constipation. He reports that he quit drinking alcohol already. Patient is educated to not start drinking alcohol again. Will continue current pain medications and followup.

## 2012-11-24 NOTE — Assessment & Plan Note (Signed)
-  Patient reports that he had a flu shot in hospital to me, but the Dr. Rogelia Boga found out that the patient may have misunderstood Lovenox shot to be flu shot. We needed to address this question in his next visit. -Patient reports that he will do the colonoscopy in [place] in near future.

## 2012-11-24 NOTE — Assessment & Plan Note (Signed)
Patient's urinary symptoms are most consistent with BPH. He does not have alarming symptoms for prostate cancer. We'll start tamsulosin and followup.

## 2012-11-24 NOTE — Progress Notes (Signed)
Patient ID: [name], male   DOB: 1956-01-06, 57 y.o.   MRN: [id number] Subjective:   Patient ID: [name] male   DOB: [birth year] 57 y.o.   MRN: [id number]  CC:   Hospital followup visit, pancreatitis HPI:  [name] is a 57 y.o. man with past medical history as outlined below, who presents for a hospital followup visit today.  Patient was recently hospitalized from 1/4-11/03/12 because of pancreatitis. Patient had abdominal ultrasound which showed an edematous pancreas without gallstones. Patient had elevated lipase up to 976, which tended down to 526. Patient's triglyceride level could not explain pancreatitis. It is most likely that patient's pancreatitis was triggered by drinking alcohol.  Today patient reports that he still has moderate abdominal pain which is located at the epigastric area. It is alleviated by Norco pain medication which he obtained from pain clinic for his chronic joint pain. He said he quit drinking alcohol. He did not drink any alcohol since being discharged. Today he does not have nausea, vomiting or diarrhea.  Patient reports that recently he has dribbling and night urination. He has hesitation when he starts to urinate. He noticed that his urinary stream becomes smaller. This problem has been going on for more than 2 months. He has chronic lower back pain which has not been worsening recently. He does not have weight loss.   Denies fever, chills, fatigue, headaches,  cough, chest pain, SOB,  constipation, hematuria or rashes.  Past Medical History  Diagnosis Date  . Hyperlipidemia   . Alcohol abuse   . Tobacco user   . Anxiety     With Post traumatic stress disorder  . Osteoarthritis   . GERD (gastroesophageal reflux disease)   . Allergic rhinitis   . Rheumatoid arthritis   . Hypertension   . Pancreatitis    Current Outpatient Prescriptions  Medication Sig Dispense Refill  . ALPRAZolam (XANAX) 1 MG tablet Take 0.5 mg by mouth at bedtime  as needed. Anxiety.Take 1/2  Tablet as needed twice a day and one tablet at bedtime.      Marland Kitchen aspirin 81 MG EC tablet Take 81 mg by mouth daily.        Marland Kitchen gemfibrozil (LOPID) 600 MG tablet Take 600 mg by mouth 2 (two) times daily.        Marland Kitchen HYDROcodone-acetaminophen (NORCO/VICODIN) 5-325 MG per tablet Take 1-2 tablets by mouth every 6 (six) hours as needed.  10 tablet  0  . lisinopril-hydrochlorothiazide (PRINZIDE,ZESTORETIC) 10-12.5 MG per tablet Take 1 tablet by mouth daily.      . metoprolol tartrate (LOPRESSOR) 25 MG tablet Take 25 mg by mouth 2 (two) times daily.        . Multiple Vitamin (MULTIVITAMIN WITH MINERALS) TABS Take 1 tablet by mouth daily.      Marland Kitchen docusate sodium (COLACE) 100 MG capsule Take 1 capsule (100 mg total) by mouth 2 (two) times daily.  10 capsule  0  . folic acid (FOLVITE) 1 MG tablet Take 1 tablet (1 mg total) by mouth daily.      . Tamsulosin HCl (FLOMAX) 0.4 MG CAPS Take 1 capsule (0.4 mg total) by mouth daily after supper.  60 capsule  3   No family history on file. History   Social History  . Marital Status: Single    Spouse Name: N/A    Number of Children: N/A  . Years of Education: N/A   Social History Main Topics  . Smoking status:  Current Every Day Smoker -- 0.4 packs/day    Types: Cigarettes  . Smokeless tobacco: Never Used  . Alcohol Use: 0.0 oz/week    7-14 Cans of beer per week  . Drug Use: No  . Sexually Active: None   Other Topics Concern  . 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    Review of Systems: as per HPI  Objective:  Physical Exam: Filed Vitals:   11/24/12 1457 11/24/12 1522  BP: 158/85 142/72  Pulse: 71   Temp: 97.8 F (36.6 C)   TempSrc: Oral   Height: 5\' 7"  (1.702 m)   Weight: 162 lb 14.4 oz (73.891 kg)   SpO2: 98%    Constitutional: Vital signs reviewed.  Patient is a well-developed and well-nourished, in no  acute distress and cooperative with exam.  HEENT:  Head: Normocephalic and atraumatic Ear: TM normal bilaterally Mouth: no erythema or exudates, MMM Eyes: PERRL, EOMI, conjunctivae normal, No scleral icterus.  Neck: Supple, Trachea midline normal ROM, No JVD, mass, thyromegaly, or carotid bruit present. No lymph node enlargement. Cardiovascular: RRR, S1 normal, S2 normal, no MRG, pulses symmetric and intact bilaterally Pulmonary/Chest: CTAB, no wheezes, rales, or rhonchi Abdominal: Soft. Mild tender over epigastric area, non-distended, bowel sounds are normal, no masses, organomegaly. GU: no CVA tenderness.  Anal digital exam: normal anal tone, no hemorrhoids. Prostate is smooth without any nodules. Musculoskeletal: No joint deformities, erythema, or stiffness, ROM full and non-tender Hematology: no cervical, inginal, or axillary adenopathy.  Neurological: A&O x3, Strength is normal and symmetric bilaterally, cranial nerve II-XII are grossly intact, no focal motor deficit, sensory intact to light touch bilaterally.  Skin: Warm, dry and intact. No rash, cyanosis, or clubbing.  Psychiatric: Normal mood and affect. speech and behavior is normal. Judgment and thought content normal. Cognition and memory are normal.   Assessment & Plan:

## 2012-11-24 NOTE — Assessment & Plan Note (Addendum)
BP Readings from Last 3 Encounters:  11/24/12 142/72  11/03/12 162/85  09/18/11 155/91    Lab Results  Component Value Date   NA 130* 11/03/2012   K 3.4* 11/03/2012   CREATININE 0.87 11/03/2012    Assessment:  Blood pressure control: mildly elevated  Progress toward BP goal:  improved  Comments:   Plan:  Medications:  continue current medications  Educational resources provided: brochure brochure  Self management tools provided:    Other plans: Patient's blood pressure is slightly elevated. Repeated blood pressure is 142/72. Since patient will start tamsulosin for BPH which should help in controlling his blood pressure.

## 2012-12-25 ENCOUNTER — Encounter: Payer: Self-pay | Admitting: Internal Medicine

## 2012-12-25 ENCOUNTER — Ambulatory Visit (INDEPENDENT_AMBULATORY_CARE_PROVIDER_SITE_OTHER): Payer: Medicaid Other | Admitting: Internal Medicine

## 2012-12-25 VITALS — BP 146/82 | HR 94 | Temp 96.2°F | Ht 67.25 in | Wt 148.6 lb

## 2012-12-25 DIAGNOSIS — J029 Acute pharyngitis, unspecified: Secondary | ICD-10-CM

## 2012-12-25 MED ORDER — BENZOCAINE-MENTHOL 6-10 MG MT LOZG: 1.0000 | LOZENGE | OROMUCOSAL | Status: DC | PRN

## 2012-12-25 NOTE — Progress Notes (Signed)
Subjective:   Patient ID: [name] male   DOB: Feb 15, 1956 57 y.o.   MRN: [id number]  HPI: 57 year old gentleman with past medical history significant for GERD, hypertension presents to the clinic for an acute visit for sore throat.  Patient reports that he has been having sore throat for last one week  He currently rates his pain 8/10 and it is associated with difficulty swallowing. He also reports having some cough where he is bringing whitish to yellowish phlegm but denies any fever, chills, shortness of breath, nausea or vomiting.   Past Medical History  Diagnosis Date  . Hyperlipidemia   . Alcohol abuse   . Tobacco user   . Anxiety     With Post traumatic stress disorder  . Osteoarthritis   . GERD (gastroesophageal reflux disease)   . Allergic rhinitis   . Rheumatoid arthritis   . Hypertension   . Pancreatitis    No family history on file. History   Social History  . Marital Status: Single    Spouse Name: N/A    Number of Children: N/A  . Years of Education: N/A   Occupational History  . Not on file.   Social History Main Topics  . Smoking status: Current Every Day Smoker -- 0.40 packs/day    Types: Cigarettes  . Smokeless tobacco: Never Used  . Alcohol Use: 0.0 oz/week    7-14 Cans of beer per week  . Drug Use: No  . Sexually Active: Not on file   Other Topics Concern  . Not on file   Social History Narrative   Georganna Skeans. Married- 1 male sexual partner.   Denies drug use but has hx of crack cocaine, alcohol and tobacco use.   Review of Systems: General: Denies fever, chills, diaphoresis, appetite change and fatigue. HEENT: Denies photophobia, eye pain, redness, hearing loss, ear pain, congestion, + sore throat, rhinorrhea, sneezing, mouth sores, trouble swallowing, neck pain, neck stiffness and tinnitus. Respiratory: Denies SOB, DOE, cough, chest tightness, and wheezing. Cardiovascular: Denies to chest pain, palpitations and leg  swelling. Gastrointestinal: Denies nausea, vomiting, abdominal pain, diarrhea, constipation, blood in stool and abdominal distention. Genitourinary: Denies dysuria, urgency, frequency, hematuria, flank pain and difficulty urinating. Musculoskeletal: Denies myalgias, back pain, joint swelling, arthralgias and gait problem.  Skin: Denies pallor, rash and wound. Neurological: Denies dizziness, seizures, syncope, weakness, light-headedness, numbness and headaches. Hematological: Denies adenopathy, easy bruising, personal or family bleeding history. Psychiatric/Behavioral: Denies suicidal ideation, mood changes, confusion, nervousness, sleep disturbance and agitation.    Current Outpatient Medications: Current Outpatient Prescriptions  Medication Sig Dispense Refill  . ALPRAZolam (XANAX) 1 MG tablet Take 0.5 mg by mouth at bedtime as needed. Anxiety.Take 1/2  Tablet as needed twice a day and one tablet at bedtime.      Marland Kitchen aspirin 81 MG EC tablet Take 81 mg by mouth daily.        Marland Kitchen docusate sodium (COLACE) 100 MG capsule Take 1 capsule (100 mg total) by mouth 2 (two) times daily.  10 capsule  0  . folic acid (FOLVITE) 1 MG tablet Take 1 tablet (1 mg total) by mouth daily.      Marland Kitchen gemfibrozil (LOPID) 600 MG tablet Take 600 mg by mouth 2 (two) times daily.        Marland Kitchen HYDROcodone-acetaminophen (NORCO/VICODIN) 5-325 MG per tablet Take 1-2 tablets by mouth every 6 (six) hours as needed.  10 tablet  0  . lisinopril-hydrochlorothiazide (PRINZIDE,ZESTORETIC) 10-12.5 MG per tablet Take 1 tablet  by mouth daily.      . metoprolol tartrate (LOPRESSOR) 25 MG tablet Take 25 mg by mouth 2 (two) times daily.        . Multiple Vitamin (MULTIVITAMIN WITH MINERALS) TABS Take 1 tablet by mouth daily.      . Tamsulosin HCl (FLOMAX) 0.4 MG CAPS Take 1 capsule (0.4 mg total) by mouth daily after supper.  60 capsule  3   No current facility-administered medications for this visit.    Allergies: No Known  Allergies    Objective:   Physical Exam: Filed Vitals:   12/25/12 1009  BP: 146/82  Pulse: 94  Temp: 96.2 F (35.7 C)    General: Vital signs reviewed and noted. Well-developed, well-nourished, in no acute distress; alert, appropriate and cooperative throughout examination. Head: Normocephalic, atraumatic, pharyngeal erythema, exudates +  Neck: left anterior cervical lymphadenopathy ~ 1mm in size palpated but no overlying tenderness Lungs: Normal respiratory effort. Clear to auscultation BL without crackles or wheezes. Heart: RRR. S1 and S2 normal without gallop, murmur, or rubs. Abdomen:BS normoactive. Soft, Nondistended, non-tender.  No masses or organomegaly. Extremities: No pretibial edema.     Assessment & Plan:

## 2012-12-25 NOTE — Assessment & Plan Note (Addendum)
She presents with sore throat for a week associated with difficulty swallowing and mild productive cough. On exam he has pharyngeal erythema and exudates with nontender left cervical lymphadenopathy. He meets one/two out of four Centor criteria with tonsillar exudate and ? Cervical lymphadenopathy.  I think his sore throat is likely viral in origin. Although rapid antigen stress test is not indicated in his case but would like to rule out GAS , with the evidence of exudates on exam . -Supportive care for now  -Benzocaine lozenges  -Ibuprofen for pain  -Drink plenty of fluids  -Warm saline gargles .  -Call the clinic if it doesn't improve in 1 week .

## 2012-12-25 NOTE — Patient Instructions (Addendum)
General Instructions: Please schedule a follow up appointment in 1-2 months . Please bring your medication bottles with your next appointment. Please take your medicines as prescribed. I will call you with your lab results if anything will be abnormal. Please drink plenty of fluids. Please take ibuprofen for pain Please do warm saline gargles.     Treatment Goals:  Goals (1 Years of Data) as of 12/25/12   None      Progress Toward Treatment Goals:  Treatment Goal 12/25/2012  Blood pressure unchanged  Stop smoking smoking the same amount    Self Care Goals & Plans:  Self Care Goal 12/25/2012  Manage my medications take my medicines as prescribed; bring my medications to every visit; refill my medications on time; follow the sick day instructions if I am sick  Monitor my health keep track of my blood pressure  Eat healthy foods eat more vegetables; eat fruit for snacks and desserts; eat baked foods instead of fried foods; drink diet soda or water instead of juice or soda  Be physically active take a walk every day; find an activity I enjoy       Care Management & Community Referrals:

## 2012-12-26 LAB — CULTURE, GROUP A STREP

## 2013-06-16 ENCOUNTER — Encounter: Payer: Medicaid Other | Admitting: Internal Medicine

## 2013-06-16 ENCOUNTER — Ambulatory Visit (INDEPENDENT_AMBULATORY_CARE_PROVIDER_SITE_OTHER): Payer: Medicaid Other | Admitting: Internal Medicine

## 2013-06-16 ENCOUNTER — Encounter: Payer: Self-pay | Admitting: Internal Medicine

## 2013-06-16 VITALS — BP 134/82 | HR 92 | Temp 97.3°F | Ht 67.0 in | Wt 156.1 lb

## 2013-06-16 DIAGNOSIS — F411 Generalized anxiety disorder: Secondary | ICD-10-CM

## 2013-06-16 DIAGNOSIS — R739 Hyperglycemia, unspecified: Secondary | ICD-10-CM

## 2013-06-16 DIAGNOSIS — E119 Type 2 diabetes mellitus without complications: Secondary | ICD-10-CM | POA: Insufficient documentation

## 2013-06-16 DIAGNOSIS — S43429A Sprain of unspecified rotator cuff capsule, initial encounter: Secondary | ICD-10-CM

## 2013-06-16 DIAGNOSIS — M75101 Unspecified rotator cuff tear or rupture of right shoulder, not specified as traumatic: Secondary | ICD-10-CM

## 2013-06-16 DIAGNOSIS — R21 Rash and other nonspecific skin eruption: Secondary | ICD-10-CM

## 2013-06-16 DIAGNOSIS — R7309 Other abnormal glucose: Secondary | ICD-10-CM

## 2013-06-16 DIAGNOSIS — M1990 Unspecified osteoarthritis, unspecified site: Secondary | ICD-10-CM

## 2013-06-16 DIAGNOSIS — G894 Chronic pain syndrome: Secondary | ICD-10-CM

## 2013-06-16 DIAGNOSIS — I10 Essential (primary) hypertension: Secondary | ICD-10-CM

## 2013-06-16 LAB — POCT GLYCOSYLATED HEMOGLOBIN (HGB A1C): Hemoglobin A1C: 5.5

## 2013-06-16 MED ORDER — HYDROCODONE-ACETAMINOPHEN 5-325 MG PO TABS: 1.0000 | ORAL_TABLET | Freq: Four times a day (QID) | ORAL | Status: DC | PRN

## 2013-06-16 MED ORDER — ALPRAZOLAM 1 MG PO TABS: 0.5000 mg | ORAL_TABLET | Freq: Every evening | ORAL | Status: DC | PRN

## 2013-06-16 NOTE — Assessment & Plan Note (Signed)
I spent significant 15 counseling the patient that he will need long-term treatment for his anxiety symptoms. The patient also exhibited some symptoms of depression, however, he denies suicidal ideations or suicide attempts. In the past he has been offered an SSRI, but the patient has been adamant and unwilling to start any other medication. He insisted on a prescription of Xanax. I cautioned the patient that this will be the last prescription from this clinic as it is our medical opinion that the best treatment for his symptoms is an SSRI. The next option will be referral to [facility] for a formal psych evaluation.  I prescribed 30 pills of Xanax to take a 1 mg daily when necessary for sleep/anxiety

## 2013-06-16 NOTE — Assessment & Plan Note (Signed)
No evidence of rash, fungal infection, without urinary symptoms or groin lymph node tenderness. I suspect that his itching is from skin dryness. Recommended Gold Bond powder or cream. I counseled him to avoid applying alcohol as this will worsen his skin dryness . If symptoms persists patient will call the clinic.

## 2013-06-16 NOTE — Assessment & Plan Note (Signed)
BP Readings from Last 3 Encounters:  06/16/13 134/82  12/25/12 146/82  11/24/12 142/72    Lab Results  Component Value Date   NA 130* 11/03/2012   K 3.4* 11/03/2012   CREATININE 0.87 11/03/2012    Assessment: Blood pressure control: controlled Progress toward BP goal:  at goal Comments:   Plan: Medications:  continue current medications Educational resources provided: brochure Self management tools provided: home blood pressure logbook Other plans: none

## 2013-06-16 NOTE — Assessment & Plan Note (Signed)
Patient has violated his pain contracts from all clinic. We are not prescribing chronic narcotics. Patient declines to return to his previous pain clinic. I have referred him to another pain clinic in the area and is willing to go. I have given him a one-month supply of Vicodin with the understanding that we are not providing long-term narcotics. No need for UDS.

## 2013-06-16 NOTE — Assessment & Plan Note (Signed)
The basis for this diagnosis is unclear. He reports that he is taking an unspecified dose of metformin. I encouraged him to bring his borders on his next visit. HbA1c performed today is 5.5%. Will continue to monitor

## 2013-06-16 NOTE — Patient Instructions (Addendum)
General Instructions: Please bring your medications next time  We will try to find you another pain clinic in the area We can not prescribe Hydrocodone on a long term basis Please discuss with your doctor in the next about treatment of your anxiety  You can Gold Bond powder for your rash in the groin, if symptoms do not improve call clinic Please come back in one month.  Treatment Goals:  Goals (1 Years of Data) as of 06/16/13         As of Today 12/25/12 11/24/12 11/24/12 11/03/12     Blood Pressure    . Blood Pressure < 140/90  134/82 146/82 142/72 158/85 162/85      Progress Toward Treatment Goals:  Treatment Goal 06/16/2013  Blood pressure at goal  Stop smoking -    Self Care Goals & Plans:  Self Care Goal 06/16/2013  Manage my medications take my medicines as prescribed; bring my medications to every visit; refill my medications on time  Monitor my health -  Eat healthy foods drink diet soda or water instead of juice or soda; eat more vegetables; eat foods that are low in salt; eat baked foods instead of fried foods  Be physically active -  Stop smoking call [facility] (1-800-QUIT-NOW)       Care Management & Community Referrals:  Referral 06/16/2013  Referrals made for care management support diabetes educator

## 2013-06-16 NOTE — Progress Notes (Addendum)
Patient ID: [name], male   DOB: Jun 05, 1956, 57 y.o.   MRN: [id number]   Subjective:   HPI: [name] is a 57 y.o. gentleman with past medical history of chronic back pain, osteoarthritis, hypertension, anxiety, presents to the clinic with the acute complaint of worsening of his low back pain and right shoulder pain over the past month.  Pain His not on chronic narcotics due to multiple red flags and violation of his pain contract. Patient has been to the pain clinic before, but he discharged himself for reasons of the clinic being over crowded, long waiting time, and multiple tests. He did not follow his appointment 3 months ago. He reports that currently his pain  10/10 in his lower back and right shoulder. He reports that he is unable to sleep. His requesting for some pain medications to help.   Anxienty He also reports symptoms of anxiety and increased distress. Patient is requesting for Xanax for this problem. He has been informed previously that we cannot provide benzos on a long-term basis as the best treatment is an SSRI. Patient is not willing to try SSRI. He requests to address this issue again when he comes back for followup visit next month. He reports that he needs more time to think SSRIs and other medications besides benzos.  Groin Rash Patient is also complaining of itching at the base of his penis which he has noticed over the last 1 week. No urinary symptoms, fevers or pain. No urethral discharge. He has not tried anything for this itching.  Diabetes He reports that while he was at the [place] in March 2014 for colonoscopy in [place], his blood sugar was noted to be high. He was prescribed metformin and he has been compliant with his medication without any side effects. He does not check his blood sugars at home. He does not have his medication bottles today. No hypoglycemia episodes. A1c today is 5.5%  Past Medical History  Diagnosis Date  . Hyperlipidemia   .  Alcohol abuse   . Tobacco user   . Anxiety     With Post traumatic stress disorder  . Osteoarthritis   . GERD (gastroesophageal reflux disease)   . Allergic rhinitis   . Rheumatoid arthritis(714.0)   . Hypertension   . Pancreatitis    Current Outpatient Prescriptions  Medication Sig Dispense Refill  . ALPRAZolam (XANAX) 1 MG tablet Take 0.5 tablets (0.5 mg total) by mouth at bedtime as needed. Anxiety.Take 1/2  Tablet as needed twice a day and one tablet at bedtime.  30 tablet  0  . aspirin 81 MG EC tablet Take 81 mg by mouth daily.        . folic acid (FOLVITE) 1 MG tablet Take 1 tablet (1 mg total) by mouth daily.      Marland Kitchen HYDROcodone-acetaminophen (NORCO/VICODIN) 5-325 MG per tablet Take 1-2 tablets by mouth every 6 (six) hours as needed.  120 tablet  0  . lisinopril-hydrochlorothiazide (PRINZIDE,ZESTORETIC) 10-12.5 MG per tablet Take 1 tablet by mouth daily.      . metFORMIN (GLUCOPHAGE) 500 MG tablet Take 500 mg by mouth daily with breakfast.      . metoprolol tartrate (LOPRESSOR) 25 MG tablet Take 25 mg by mouth 2 (two) times daily.        . Multiple Vitamin (MULTIVITAMIN WITH MINERALS) TABS Take 1 tablet by mouth daily.       No current facility-administered medications for this visit.   No family  history on file. History   Social History  . Marital Status: Single    Spouse Name: N/A    Number of Children: N/A  . Years of Education: N/A   Social History Main Topics  . Smoking status: Current Every Day Smoker -- 0.40 packs/day    Types: Cigarettes  . Smokeless tobacco: Never Used     Comment: trying  . Alcohol Use: 0.0 oz/week    7-14 Cans of beer per week  . Drug Use: No  . Sexual Activity: None   Other Topics Concern  . 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   Denies drug use but has hx of crack cocaine, alcohol and tobacco use.   Review of Systems: Constitutional: Denies fever, chills, diaphoresis, appetite change and  fatigue.  Respiratory: Denies SOB, DOE, cough, chest tightness, and wheezing.  Cardiovascular: No chest pain, palpitations and leg swelling.  Gastrointestinal: No abdominal pain, nausea, vomiting, bloody stools Genitourinary: No dysuria, frequency, hematuria, or flank pain.   Objective:  Physical Exam: Filed Vitals:   06/16/13 0919  BP: 134/82  Pulse: 92  Temp: 97.3 F (36.3 C)  TempSrc: Oral  Height: 5\' 7"  (1.702 m)  Weight: 156 lb 1.6 oz (70.806 kg)  SpO2: 98%   General: Well nourished. No acute distress. Daughter in exam room. Lungs: CTA bilaterally.  Heart: RRR; no extra sounds or murmurs  Abdomen: Non-distended, normal BS, soft, nontender; no hepatosplenomegaly  Genital exam: There is a small area of dryness and scaling on the base of his penis covering a small area of about 5 cm in diameter. No rash. No regional lymph node, tenderness. Exam is otherwise normal. Extremities: No pedal edema. Exhibits severe tenderness of the right shoulder. No increased warmth. No evidence of fluid accumulation. Back: Tenderness in the lumbar region. Neurologic: Alert and oriented x3. No obvious neurologic deficits. Sensation and power in all extremities is intact  Assessment & Plan:  I have discussed my assessment and plan  with Dr. Criselda Peaches  as detailed under problem based charting.

## 2013-06-21 NOTE — Progress Notes (Signed)
Case discussed with Dr. Zada Girt soon after the resident saw the patient.  We reviewed the resident's history and exam and pertinent patient test results.  I agree with the assessment, diagnosis, and plan of care documented in the resident's note.  Agree with Dr. Kirtland Bouchard, no further BZDs or narcotics.  He needs appropriate psych and pain follow up.

## 2013-06-30 NOTE — Addendum Note (Signed)
Addended by: Neomia Dear on: 06/30/2013 01:45 PM   Modules accepted: Orders

## 2013-07-08 ENCOUNTER — Telehealth: Payer: Self-pay | Admitting: *Deleted

## 2013-07-08 NOTE — Telephone Encounter (Signed)
Noted. He will follow up with PCP for further plan.   Thanks.

## 2013-07-08 NOTE — Telephone Encounter (Signed)
Call to pt to inform him that his referral to the Pain Center was declined .  Pt was told of reason.  Stated that it happened 10 years or more.  Pt was informed that the decision was made by the Pain Center not to take him on as a patient.  Pt was asked about being referred to [facility] said that he could not get there.  Pt was informed that referrals will be made to other Pain Center in [place].  Pt also said that his pain medication is not working would like to get something stronger.   Angelina Ok, RN 07/08/2013  10:01 AM.

## 2013-07-14 ENCOUNTER — Telehealth: Payer: Self-pay | Admitting: *Deleted

## 2013-07-19 ENCOUNTER — Other Ambulatory Visit: Payer: Self-pay | Admitting: *Deleted

## 2013-07-19 DIAGNOSIS — F411 Generalized anxiety disorder: Secondary | ICD-10-CM

## 2013-07-20 ENCOUNTER — Other Ambulatory Visit: Payer: Self-pay | Admitting: *Deleted

## 2013-07-20 DIAGNOSIS — M1990 Unspecified osteoarthritis, unspecified site: Secondary | ICD-10-CM

## 2013-07-20 DIAGNOSIS — M75101 Unspecified rotator cuff tear or rupture of right shoulder, not specified as traumatic: Secondary | ICD-10-CM

## 2013-07-20 DIAGNOSIS — G894 Chronic pain syndrome: Secondary | ICD-10-CM

## 2013-07-20 NOTE — Telephone Encounter (Signed)
Pt aware and upset - appt made to discuss meds.

## 2013-07-20 NOTE — Telephone Encounter (Signed)
Dr Clyde Lundborg, it appears that [name] has been refused by pain management at becoming a pt under them, do you want to fill the pain med until he can see you again?

## 2013-07-20 NOTE — Telephone Encounter (Signed)
Triage spoke w/ dr Clyde Lundborg he refused to give refills of controlled substances due to contract violations, he will discuss with pt at tomorrow's appt

## 2013-07-21 ENCOUNTER — Encounter: Payer: Self-pay | Admitting: Internal Medicine

## 2013-07-21 ENCOUNTER — Ambulatory Visit (INDEPENDENT_AMBULATORY_CARE_PROVIDER_SITE_OTHER): Payer: Medicaid Other | Admitting: Internal Medicine

## 2013-07-21 VITALS — BP 136/87 | HR 73 | Temp 97.8°F | Ht 67.0 in | Wt 158.4 lb

## 2013-07-21 DIAGNOSIS — M549 Dorsalgia, unspecified: Secondary | ICD-10-CM

## 2013-07-21 DIAGNOSIS — Z0000 Encounter for general adult medical examination without abnormal findings: Secondary | ICD-10-CM

## 2013-07-21 DIAGNOSIS — G894 Chronic pain syndrome: Secondary | ICD-10-CM

## 2013-07-21 DIAGNOSIS — M1990 Unspecified osteoarthritis, unspecified site: Secondary | ICD-10-CM

## 2013-07-21 DIAGNOSIS — E119 Type 2 diabetes mellitus without complications: Secondary | ICD-10-CM

## 2013-07-21 DIAGNOSIS — M75101 Unspecified rotator cuff tear or rupture of right shoulder, not specified as traumatic: Secondary | ICD-10-CM

## 2013-07-21 DIAGNOSIS — I10 Essential (primary) hypertension: Secondary | ICD-10-CM

## 2013-07-21 LAB — GLUCOSE, CAPILLARY: Glucose-Capillary: 190 mg/dL — ABNORMAL HIGH (ref 70–99)

## 2013-07-21 MED ORDER — HYDROCODONE-ACETAMINOPHEN 5-325 MG PO TABS: 1.0000 | ORAL_TABLET | Freq: Four times a day (QID) | ORAL | Status: DC | PRN

## 2013-07-21 NOTE — Assessment & Plan Note (Signed)
BP Readings from Last 3 Encounters:  07/21/13 136/87  06/16/13 134/82  12/25/12 146/82    Lab Results  Component Value Date   NA 130* 11/03/2012   K 3.4* 11/03/2012   CREATININE 0.87 11/03/2012    Assessment: Blood pressure control: controlled Progress toward BP goal:  at goal Comments:   Plan: Medications:  continue current medications Educational resources provided: brochure;handout;video Self management tools provided:   Other plans: It is well controlled. We'll continue current regimen.

## 2013-07-21 NOTE — Assessment & Plan Note (Signed)
Patient has violated his pain contracts from all clinics. He was given referral to another pain clinic in previous visit, unfortunately he was refused again. Currently patient still has significant back pain and right shoulder pain. He does not have alarming symptoms, such as urinary incontinence or loss of control of bowel moment. His muscle strength is normal. The knee reflexes normal bilaterally. He refused rectal examination for rectal tone.  -will give him referral to sports medicine -prescribe pain medication vicodin to bridge him to see sports medicine. I made it very clear that we will not give pain medications before he is seen by sports medicine.  -He refused anti-inflammatory treatment, including ibuprofen, naproxen, Mobic. -He refused Flexeril. -will screen UDS

## 2013-07-21 NOTE — Patient Instructions (Signed)
1. Please take all medications as prescribed.  2. If you have worsening of your symptoms or new symptoms arise, please call the clinic [phone]), or go to the ER immediately if symptoms are severe.  Back Pain, Adult Low back pain is very common. About 1 in 5 people have back pain.The cause of low back pain is rarely dangerous. The pain often gets better over time.About half of people with a sudden onset of back pain feel better in just 2 weeks. About 8 in 10 people feel better by 6 weeks.  CAUSES Some common causes of back pain include:  Strain of the muscles or ligaments supporting the spine.  Wear and tear (degeneration) of the spinal discs.  Arthritis.  Direct injury to the back. DIAGNOSIS Most of the time, the direct cause of low back pain is not known.However, back pain can be treated effectively even when the exact cause of the pain is unknown.Answering your caregiver's questions about your overall health and symptoms is one of the most accurate ways to make sure the cause of your pain is not dangerous. If your caregiver needs more information, he or she may order lab work or imaging tests (X-rays or MRIs).However, even if imaging tests show changes in your back, this usually does not require surgery. HOME CARE INSTRUCTIONS For many people, back pain returns.Since low back pain is rarely dangerous, it is often a condition that people can learn to [facility] their own.   Remain active. It is stressful on the back to sit or stand in one place. Do not sit, drive, or stand in one place for more than 30 minutes at a time. Take short walks on level surfaces as soon as pain allows.Try to increase the length of time you walk each day.  Do not stay in bed.Resting more than 1 or 2 days can delay your recovery.  Do not avoid exercise or work.Your body is made to move.It is not dangerous to be active, even though your back may hurt.Your back will likely heal faster if you return to being  active before your pain is gone.  Pay attention to your body when you bend and lift. Many people have less discomfortwhen lifting if they bend their knees, keep the load close to their bodies,and avoid twisting. Often, the most comfortable positions are those that put less stress on your recovering back.  Find a comfortable position to sleep. Use a firm mattress and lie on your side with your knees slightly bent. If you lie on your back, put a pillow under your knees.  Only take over-the-counter or prescription medicines as directed by your caregiver. Over-the-counter medicines to reduce pain and inflammation are often the most helpful.Your caregiver may prescribe muscle relaxant drugs.These medicines help dull your pain so you can more quickly return to your normal activities and healthy exercise.  Put ice on the injured area.  Put ice in a plastic bag.  Place a towel between your skin and the bag.  Leave the ice on for 15-20 minutes, 3-4 times a day for the first 2 to 3 days. After that, ice and heat may be alternated to reduce pain and spasms.  Ask your caregiver about trying back exercises and gentle massage. This may be of some benefit.  Avoid feeling anxious or stressed.Stress increases muscle tension and can worsen back pain.It is important to recognize when you are anxious or stressed and learn ways to manage it.Exercise is a great option. SEEK MEDICAL CARE IF:  You have pain that is not relieved with rest or medicine.  You have pain that does not improve in 1 week.  You have new symptoms.  You are generally not feeling well. SEEK IMMEDIATE MEDICAL CARE IF:   You have pain that radiates from your back into your legs.  You develop new bowel or bladder control problems.  You have unusual weakness or numbness in your arms or legs.  You develop nausea or vomiting.  You develop abdominal pain.  You feel faint. Document Released: 10/14/2005 Document Revised:  04/14/2012 Document Reviewed: 03/04/2011 [facility] Patient Information 2014 [place], [place].

## 2013-07-21 NOTE — Assessment & Plan Note (Signed)
Lab Results  Component Value Date   HGBA1C 5.5 06/16/2013     Assessment: Diabetes control: good control (HgbA1C at goal) Progress toward A1C goal:  at goal Comments:   Plan: Medications:  continue current medications Home glucose monitoring: Frequency:   Timing:   Instruction/counseling given: reminded to bring medications to each visit Educational resources provided: brochure;handout Self management tools provided:   Other plans: His diabetes is well controlled. Will continue current regimen.

## 2013-07-21 NOTE — Progress Notes (Signed)
Patient ID: [name], male   DOB: [birth year], 57 y.o.   MRN: [id number] Subjective:   Patient ID: [name] male   DOB: 06-Feb-1956 57 y.o.   MRN: [id number]  CC:   Follow up visit. HPI:  [name] is a 57 y.o. man with past medical history as outlined below, who presents for a followup visit today  1. Right shoulder and back pain: Patient was seen in clinic on 07/08/13. Patient has chronic joint pain and back pain for years. He used to take narcotic medications for his pain and was followed up by pain clinic. Due to multiple red flags and violation of his pain contract, his narcotic medication was discontinued. He visited pain clinic before, but he discharged himself for reasons of the clinic being over crowded, long waiting time, and multiple tests. He did not follow his appointment recently. Patient was given referral to pain clinic in previous visit, however he was rejected. He reports that currently his pain is 10/10 in his lower back and right shoulder. He reports that he is unable to sleep. His requesting for some pain medications to help. He does not have alarming symptoms, such as urinary incontinence, or loss of control of bowel moments. He does not have weakness in his extremities. He does not have fever or chills.  2. DM-II: he is currently on metformin 500 mg  Bid. His recent A1c was 5.5 on 06/16/13. He does not have symptoms of hypo-or hyperglycemia.  3. HTN:  bp is normal today. He does not have chest pain, shortness of breath, palpitation or leg edema.  ROS:  Denies fever, chills, fatigue, headaches, cough, chest pain, SOB,  abdominal pain, diarrhea, constipation, dysuria, urgency, frequency, hematuria.      Past Medical History  Diagnosis Date  . Hyperlipidemia   . Alcohol abuse   . Tobacco user   . Anxiety     With Post traumatic stress disorder  . Osteoarthritis   . GERD (gastroesophageal reflux disease)   . Allergic rhinitis   . Rheumatoid  arthritis(714.0)   . Hypertension   . Pancreatitis    Current Outpatient Prescriptions  Medication Sig Dispense Refill  . ALPRAZolam (XANAX) 1 MG tablet Take 0.5 tablets (0.5 mg total) by mouth at bedtime as needed. Anxiety.Take 1/2  Tablet as needed twice a day and one tablet at bedtime.  30 tablet  0  . aspirin 81 MG EC tablet Take 81 mg by mouth daily.        . folic acid (FOLVITE) 1 MG tablet Take 1 tablet (1 mg total) by mouth daily.      Marland Kitchen HYDROcodone-acetaminophen (NORCO/VICODIN) 5-325 MG per tablet Take 1 tablet by mouth every 6 (six) hours as needed.  90 tablet  0  . lisinopril-hydrochlorothiazide (PRINZIDE,ZESTORETIC) 10-12.5 MG per tablet Take 1 tablet by mouth daily.      . metFORMIN (GLUCOPHAGE) 500 MG tablet Take 500 mg by mouth daily with breakfast.      . metoprolol tartrate (LOPRESSOR) 25 MG tablet Take 25 mg by mouth 2 (two) times daily.        . Multiple Vitamin (MULTIVITAMIN WITH MINERALS) TABS Take 1 tablet by mouth daily.       No current facility-administered medications for this visit.   No family history on file. History   Social History  . Marital Status: Single    Spouse Name: N/A    Number of Children: N/A  . Years of Education: N/A  Social History Main Topics  . Smoking status: Current Every Day Smoker -- 0.40 packs/day    Types: Cigarettes  . Smokeless tobacco: Never Used     Comment: trying  . Alcohol Use: 0.0 oz/week    7-14 Cans of beer per week  . Drug Use: No  . Sexual Activity: None   Other Topics Concern  . 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   Denies drug use but has hx of crack cocaine, alcohol and tobacco use.    Review of Systems: Full 14-point review of systems otherwise negative. See HPI.   Objective:  Physical Exam: Filed Vitals:   07/21/13 0943  BP: 136/87  Pulse: 73  Temp: 97.8 F (36.6 C)  TempSrc: Oral  Height: 5\' 7"  (1.702 m)  Weight: 158 lb 6.4 oz (71.85 kg)  SpO2: 99%    Physical Exam:   Filed Vitals:   07/21/13 0943  BP: 136/87  Pulse: 73  Temp: 97.8 F (36.6 C)  TempSrc: Oral  Height: 5\' 7"  (1.702 m)  Weight: 158 lb 6.4 oz (71.85 kg)  SpO2: 99%    General: Not in acute distress HEENT: PERRL, EOMI, no scleral icterus, No JVD or bruit Cardiac: S1/S2, RRR, No murmurs, gallops or rubs Pulm: Good air movement bilaterally. Clear to auscultation bilaterally. No rales, wheezing, rhonchi or rubs. Abd: Soft, nondistended, nontender, no rebound pain, no organomegaly, BS present Ext: No edema. 2+DP/PT pulse bilaterally Musculoskeletal: No joint deformities, erythema, or stiffness. There is severe tenderness over the right shoulder, but no swelling or warmth. The is tenderness in the lumbar region at midline.  Neuro: Alert and oriented X3, cranial nerves II-XII grossly intact, muscle strength 5/5 in all extremeties, sensation to light touch intact. Brachial reflex 2+ bilaterally. Knee reflex 1+ bilaterally.  Psych: Patient is not psychotic, no suicidal or hemocidal ideation. Rectal Exam for rectal tone:  Patient refused rectal examination strongly.    Assessment & Plan:

## 2013-07-21 NOTE — Assessment & Plan Note (Signed)
-  Patient refused flu shot today. Will postpone.

## 2013-07-22 LAB — PRESCRIPTION ABUSE MONITORING 15P, URINE
Barbiturate Screen, Urine: NEGATIVE ng/mL
Benzodiazepine Screen, Urine: NEGATIVE ng/mL
Buprenorphine, Urine: NEGATIVE ng/mL
Creatinine, Urine: 229.11 mg/dL (ref >20.0)
Fentanyl, Ur: NEGATIVE ng/mL
Meperidine, Ur: NEGATIVE ng/mL
Methadone Screen, Urine: NEGATIVE ng/mL
Tramadol Scrn, Ur: NEGATIVE ng/mL

## 2013-07-22 NOTE — Progress Notes (Signed)
Case discussed with Dr. Niu at the time of the visit.  We reviewed the resident's history and exam and pertinent patient test results.  I agree with the assessment, diagnosis, and plan of care documented in the resident's note.    

## 2013-07-23 ENCOUNTER — Telehealth: Payer: Self-pay | Admitting: *Deleted

## 2013-07-23 NOTE — Telephone Encounter (Signed)
I spoke with [name] by phone this afternoon and informed him that on advice of his doctor and the medical director  [facility] would no longer be able to treat him. He should begin to establish a relationship with another doctor and is being dismissed from [facility]. According to policy he will be afforded appropriate care and medication for 30 days. He will be sent a certified letter explaining everything in detail which will be scanned into this record of care. [name] was disturbed by the findings but was "on the road", I requested he call me back on Monday when I can speak in more detail without being a distraction to his driving.

## 2013-07-28 LAB — OPIATES/OPIOIDS (LC/MS-MS)
Codeine Urine: NEGATIVE ng/mL
Heroin (6-AM), UR: NEGATIVE ng/mL
Hydrocodone: 66 ng/mL
Morphine Urine: NEGATIVE ng/mL
Oxycodone, ur: 73 ng/mL

## 2013-08-03 NOTE — Addendum Note (Signed)
Addended by: Neomia Dear on: 08/03/2013 01:58 PM   Modules accepted: Orders

## 2014-02-22 ENCOUNTER — Encounter (HOSPITAL_COMMUNITY): Payer: Self-pay | Admitting: Emergency Medicine

## 2014-02-22 ENCOUNTER — Emergency Department (HOSPITAL_COMMUNITY)
Admission: EM | Admit: 2014-02-22 | Discharge: 2014-02-22 | Disposition: A | Discharge status: Home / Self Care | Payer: Medicaid Other | Attending: Emergency Medicine | Admitting: Emergency Medicine

## 2014-02-22 DIAGNOSIS — Z8639 Personal history of other endocrine, nutritional and metabolic disease: Secondary | ICD-10-CM | POA: Insufficient documentation

## 2014-02-22 DIAGNOSIS — G8911 Acute pain due to trauma: Secondary | ICD-10-CM | POA: Insufficient documentation

## 2014-02-22 DIAGNOSIS — I10 Essential (primary) hypertension: Secondary | ICD-10-CM | POA: Insufficient documentation

## 2014-02-22 DIAGNOSIS — Z79899 Other long term (current) drug therapy: Secondary | ICD-10-CM | POA: Insufficient documentation

## 2014-02-22 DIAGNOSIS — M549 Dorsalgia, unspecified: Secondary | ICD-10-CM

## 2014-02-22 DIAGNOSIS — Z7982 Long term (current) use of aspirin: Secondary | ICD-10-CM | POA: Insufficient documentation

## 2014-02-22 DIAGNOSIS — F17200 Nicotine dependence, unspecified, uncomplicated: Secondary | ICD-10-CM | POA: Insufficient documentation

## 2014-02-22 DIAGNOSIS — F411 Generalized anxiety disorder: Secondary | ICD-10-CM | POA: Insufficient documentation

## 2014-02-22 DIAGNOSIS — M545 Low back pain, unspecified: Secondary | ICD-10-CM | POA: Insufficient documentation

## 2014-02-22 DIAGNOSIS — M25519 Pain in unspecified shoulder: Secondary | ICD-10-CM | POA: Insufficient documentation

## 2014-02-22 DIAGNOSIS — M069 Rheumatoid arthritis, unspecified: Secondary | ICD-10-CM | POA: Insufficient documentation

## 2014-02-22 DIAGNOSIS — Z8719 Personal history of other diseases of the digestive system: Secondary | ICD-10-CM | POA: Insufficient documentation

## 2014-02-22 DIAGNOSIS — Z862 Personal history of diseases of the blood and blood-forming organs and certain disorders involving the immune mechanism: Secondary | ICD-10-CM | POA: Insufficient documentation

## 2014-02-22 DIAGNOSIS — M1990 Unspecified osteoarthritis, unspecified site: Secondary | ICD-10-CM | POA: Insufficient documentation

## 2014-02-22 MED ORDER — NAPROXEN 500 MG PO TABS: 500.0000 mg | ORAL_TABLET | Freq: Two times a day (BID) | ORAL | Status: DC

## 2014-02-22 NOTE — ED Provider Notes (Signed)
CSN: [id number]     Arrival date & time 02/22/14  1417 History  This chart was scribed for non-physician practitioner, Alvina Chou, PA-C working with Saddie Benders. Dorna Mai, MD by Frederich Balding, ED scribe. This patient was seen in room TR08C/TR08C and the patient's care was started at 2:33 PM.   Chief Complaint  Patient presents with  . Back Pain   The history is provided by the patient. No language interpreter was used.   HPI Comments: [name] is a 58 y.o. male who presents to the Emergency Department complaining of gradual onset lower back pain that started 3 days ago. Pt is also complaining of right shoulder pain due to a rotator cuff tear. He has not followed up with the [facility] for his shoulder. Certain movements worsen both the back and shoulder pain. Pt has not taken any medications for his symptoms.   Past Medical History  Diagnosis Date  . Hyperlipidemia   . Alcohol abuse   . Tobacco user   . Anxiety     With Post traumatic stress disorder  . Osteoarthritis   . GERD (gastroesophageal reflux disease)   . Allergic rhinitis   . Rheumatoid arthritis(714.0)   . Hypertension   . Pancreatitis    History reviewed. No pertinent past surgical history. No family history on file. History  Substance Use Topics  . Smoking status: Current Every Day Smoker -- 0.40 packs/day    Types: Cigarettes  . Smokeless tobacco: Never Used     Comment: trying  . Alcohol Use: 0.0 oz/week    7-14 Cans of beer per week    Review of Systems  Musculoskeletal: Positive for back pain.  All other systems reviewed and are negative.  Allergies  Review of patient's allergies indicates no known allergies.  Home Medications   Prior to Admission medications   Medication Sig Start Date End Date Taking? Authorizing Provider  ALPRAZolam Duanne Moron) 1 MG tablet Take 0.5 tablets (0.5 mg total) by mouth at bedtime as needed. Anxiety.Take 1/2  Tablet as needed twice a day and one tablet at bedtime. 06/16/13    Jessee Avers, MD  aspirin 81 MG EC tablet Take 81 mg by mouth daily.      Historical Provider, MD  folic acid (FOLVITE) 1 MG tablet Take 1 tablet (1 mg total) by mouth daily. 11/03/12   Blain Pais, MD  HYDROcodone-acetaminophen (NORCO/VICODIN) 5-325 MG per tablet Take 1 tablet by mouth every 6 (six) hours as needed. 07/21/13   Ivor Costa, MD  lisinopril-hydrochlorothiazide (PRINZIDE,ZESTORETIC) 10-12.5 MG per tablet Take 1 tablet by mouth daily. 02/19/11   Janell Quiet, MD  metFORMIN (GLUCOPHAGE) 500 MG tablet Take 500 mg by mouth daily with breakfast.    Historical Provider, MD  metoprolol tartrate (LOPRESSOR) 25 MG tablet Take 25 mg by mouth 2 (two) times daily.      Historical Provider, MD  Multiple Vitamin (MULTIVITAMIN WITH MINERALS) TABS Take 1 tablet by mouth daily. 11/03/12   Blain Pais, MD   BP 157/90  Pulse 69  Temp(Src) 97.8 F (36.6 C) (Oral)  Resp 18  SpO2 98%  Physical Exam  Nursing note and vitals reviewed. Constitutional: He is oriented to person, place, and time. He appears well-developed and well-nourished. No distress.  HENT:  Head: Normocephalic and atraumatic.  Eyes: EOM are normal.  Neck: Neck supple. No tracheal deviation present.  Cardiovascular: Normal rate.   Pulmonary/Chest: Effort normal. No respiratory distress.  Musculoskeletal: Normal range of motion.  Right lumbar paraspinal tenderness to palpation. No midline spine tenderness to palpation.   Neurological: He is alert and oriented to person, place, and time.  Upper and lower extremity strength and sensation equal and intact.   Skin: Skin is warm and dry.  Psychiatric: He has a normal mood and affect. His behavior is normal.    ED Course  Procedures (including critical care time)  DIAGNOSTIC STUDIES: Oxygen Saturation is 98% on RA, normal by my interpretation.    COORDINATION OF CARE: 2:34 PM-Discussed treatment plan which includes an anti-inflammatory with pt at bedside and pt agreed  to plan. Advised pt to follow up with his PCP and the [place].  Labs Review Labs Reviewed - No data to display  Imaging Review No results found.   EKG Interpretation None      MDM   Final diagnoses:  Back pain    2:43 PM Patient likely has muscle pain. No neuro deficits. No bladder/bowel incontinence or saddle paresthesias. Patient will be discharged with naprosyn as instructed to apply heat to affected area.   I personally performed the services described in this documentation, which was scribed in my presence. The recorded information has been reviewed and is accurate.  Alvina Chou, PA-C 02/22/14 1444

## 2014-02-22 NOTE — ED Notes (Addendum)
Lower back pain x 3 days  Denies dysuria or injury rt shoulder hurting also states has roter cuff tear

## 2014-02-22 NOTE — Discharge Instructions (Signed)
Take naprosyn as needed for your pain. Refer to attached documents for more information. Follow up with your doctor for further evaluation.

## 2014-02-24 NOTE — ED Provider Notes (Signed)
Medical screening examination/treatment/procedure(s) were performed by non-physician practitioner and as supervising physician I was immediately available for consultation/collaboration.  Saddie Benders. Dorna Mai, MD 02/24/14 [phone]

## 2015-12-14 NOTE — H&P (Signed)
TOTAL HIP ADMISSION H&P  Patient is admitted for right total hip arthroplasty.  Subjective:  Chief Complaint: right hip pain  HPI: [name], 60 y.o. male, has a history of pain and functional disability in the right hip(s) due to arthritis and patient has failed non-surgical conservative treatments for greater than 12 weeks to include NSAID's and/or analgesics, corticosteriod injections, use of assistive devices and activity modification.  Onset of symptoms was abrupt starting 1 years ago with rapidlly worsening course since that time.The patient noted no past surgery on the right hip(s).  Patient currently rates pain in the right hip at 10 out of 10 with activity. Patient has night pain, worsening of pain with activity and weight bearing, trendelenberg gait and pain that interfers with activities of daily living. Patient has evidence of joint space narrowing by imaging studies. This condition presents safety issues increasing the risk of falls.  There is no current active infection.  Patient Active Problem List   Diagnosis Date Noted  . Groin rash 06/16/2013  . DM w/o complication type II ([place]) 06/16/2013  . BPH (benign prostatic hyperplasia) 11/24/2012  . Healthcare maintenance 11/24/2012  . Pancreatitis 10/31/2012  . Chronic pain syndrome 09/18/2011  . 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 Right rotator cuff tear 07/26/2011  . Shoulder pain, right 05/03/2011  . Right ankle pain 01/23/2011  . Left wrist pain 01/23/2011  . Back pain 01/02/2011  . MOLLUSCUM CONTAGIOSUM 12/05/2008  . LEUKOCYTOSIS UNSPECIFIED 03/25/2008  . LIVER FUNCTION TESTS, ABNORMAL 03/25/2008  . ERECTILE DYSFUNCTION 05/12/2007  . HYPERTENSION 03/02/2007  . HYPERLIPIDEMIA 01/08/2007  . ANXIETY 01/08/2007  . GERD 01/08/2007  . RHEUMATOID ARTHRITIS 01/08/2007  . OSTEOARTHRITIS 01/08/2007   Past Medical History  Diagnosis Date  . Hyperlipidemia   . Alcohol abuse   . Tobacco user   . Anxiety     With Post  traumatic stress disorder  . Osteoarthritis   . GERD (gastroesophageal reflux disease)   . Allergic rhinitis   . Rheumatoid arthritis(714.0)   . Hypertension   . Pancreatitis     No past surgical history on file.  No prescriptions prior to admission   No Known Allergies  Social History  Substance Use Topics  . Smoking status: Current Every Day Smoker -- 0.40 packs/day    Types: Cigarettes  . Smokeless tobacco: Never Used     Comment: trying  . Alcohol Use: 0.0 oz/week    7-14 Cans of beer per week    No family history on file.   Review of Systems  Constitutional: Negative for fever and chills.  HENT: Negative for congestion and sore throat.   Eyes: Negative for blurred vision.  Respiratory: Negative for cough and wheezing.   Cardiovascular: Negative for chest pain and palpitations.  Gastrointestinal: Negative for nausea, vomiting and abdominal pain.  Musculoskeletal:       Antalgic gait, Right hip pain with ambulation  Skin: Negative for rash.  Neurological: Negative for dizziness and loss of consciousness.  Psychiatric/Behavioral: Negative for depression and suicidal ideas.  All other systems reviewed and are negative.   Objective:  Physical Exam  Vitals reviewed. Constitutional: He is oriented to person, place, and time. He appears well-developed and well-nourished.  HENT:  Head: Normocephalic and atraumatic.  Eyes: Conjunctivae and EOM are normal. Pupils are equal, round, and reactive to light.  Neck: Normal range of motion. Neck supple.  Cardiovascular: Normal rate and intact distal pulses.   Respiratory: Effort normal and breath sounds normal.  GI: Soft. Bowel sounds are normal.  Musculoskeletal: Normal range of motion.  Neurological: He is alert and oriented to person, place, and time.  Skin: Skin is warm and dry.  Psychiatric: He has a normal mood and affect. His behavior is normal. Judgment and thought content normal.    Vital signs in last 24 hours:     Labs:   Estimated body mass index is 24.80 kg/(m^2) as calculated from the following:   Height as of 07/21/13: 5\' 7"  (1.702 m).   Weight as of 07/21/13: 71.85 kg (158 lb 6.4 oz).   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 The bone quality appears to be fair for age and reported activity level.  Assessment/Plan:  End stage arthritis, right hip(s)  The patient history, physical examination, clinical judgement of the provider and imaging studies are consistent with end stage degenerative joint disease of the right hip(s) and total hip arthroplasty is deemed medically necessary. The treatment options including medical management, injection therapy, arthroscopy and arthroplasty were discussed at length. The risks and benefits of total hip arthroplasty were presented and reviewed. The risks due to aseptic loosening, infection, stiffness, dislocation/subluxation,  thromboembolic complications and other imponderables were discussed.  The patient acknowledged the explanation, agreed to proceed with the plan and consent was signed. Patient is being admitted for inpatient treatment for surgery, pain control, PT, OT, prophylactic antibiotics, VTE prophylaxis, progressive ambulation and ADL's and discharge planning.The patient is planning to be discharged home with home health services

## 2015-12-21 ENCOUNTER — Encounter (HOSPITAL_COMMUNITY)
Admission: RE | Admit: 2015-12-21 | Discharge: 2015-12-21 | Disposition: A | Discharge status: Home / Self Care | Payer: Medicaid Other | Source: Ambulatory Visit | Attending: Orthopedic Surgery | Admitting: Orthopedic Surgery

## 2015-12-21 ENCOUNTER — Encounter (HOSPITAL_COMMUNITY): Payer: Self-pay

## 2015-12-21 DIAGNOSIS — E785 Hyperlipidemia, unspecified: Secondary | ICD-10-CM | POA: Diagnosis not present

## 2015-12-21 DIAGNOSIS — Z01818 Encounter for other preprocedural examination: Secondary | ICD-10-CM | POA: Insufficient documentation

## 2015-12-21 DIAGNOSIS — M1611 Unilateral primary osteoarthritis, right hip: Secondary | ICD-10-CM | POA: Diagnosis not present

## 2015-12-21 DIAGNOSIS — Z0183 Encounter for blood typing: Secondary | ICD-10-CM | POA: Diagnosis not present

## 2015-12-21 DIAGNOSIS — Z79899 Other long term (current) drug therapy: Secondary | ICD-10-CM | POA: Diagnosis not present

## 2015-12-21 DIAGNOSIS — K219 Gastro-esophageal reflux disease without esophagitis: Secondary | ICD-10-CM | POA: Diagnosis not present

## 2015-12-21 DIAGNOSIS — I10 Essential (primary) hypertension: Secondary | ICD-10-CM | POA: Insufficient documentation

## 2015-12-21 DIAGNOSIS — F17200 Nicotine dependence, unspecified, uncomplicated: Secondary | ICD-10-CM | POA: Diagnosis not present

## 2015-12-21 DIAGNOSIS — M879 Osteonecrosis, unspecified: Secondary | ICD-10-CM | POA: Diagnosis not present

## 2015-12-21 DIAGNOSIS — E119 Type 2 diabetes mellitus without complications: Secondary | ICD-10-CM | POA: Diagnosis not present

## 2015-12-21 DIAGNOSIS — Z01812 Encounter for preprocedural laboratory examination: Secondary | ICD-10-CM | POA: Diagnosis not present

## 2015-12-21 DIAGNOSIS — M069 Rheumatoid arthritis, unspecified: Secondary | ICD-10-CM | POA: Insufficient documentation

## 2015-12-21 DIAGNOSIS — Z7984 Long term (current) use of oral hypoglycemic drugs: Secondary | ICD-10-CM | POA: Diagnosis not present

## 2015-12-21 HISTORY — DX: Type 2 diabetes mellitus without complications: E11.9

## 2015-12-21 HISTORY — DX: Headache, unspecified: R51.9

## 2015-12-21 HISTORY — DX: Inflammatory liver disease, unspecified: K75.9

## 2015-12-21 HISTORY — DX: Headache: R51

## 2015-12-21 LAB — COMPREHENSIVE METABOLIC PANEL
ALBUMIN: 3.5 g/dL (ref 3.5–5.0)
ALK PHOS: 94 U/L (ref 38–126)
ALT: 26 U/L (ref 17–63)
ANION GAP: 8 (ref 5–15)
AST: 33 U/L (ref 15–41)
BILIRUBIN TOTAL: 0.2 mg/dL — AB (ref 0.3–1.2)
BUN: 6 mg/dL (ref 6–20)
CALCIUM: 9.3 mg/dL (ref 8.9–10.3)
CO2: 23 mmol/L (ref 22–32)
Chloride: 104 mmol/L (ref 101–111)
Creatinine, Ser: 0.9 mg/dL (ref 0.61–1.24)
GFR calc Af Amer: >60 mL/min (ref >60)
GLUCOSE: 128 mg/dL — AB (ref 65–99)
Potassium: 4.1 mmol/L (ref 3.5–5.1)
Sodium: 135 mmol/L (ref 135–145)
TOTAL PROTEIN: 7.2 g/dL (ref 6.5–8.1)

## 2015-12-21 LAB — PROTIME-INR
INR: 1.07 (ref 0.00–1.49)
PROTHROMBIN TIME: 14.1 s (ref 11.6–15.2)

## 2015-12-21 LAB — CBC
HCT: 42 % (ref 39.0–52.0)
HEMOGLOBIN: 14.6 g/dL (ref 13.0–17.0)
MCH: 33.3 pg (ref 26.0–34.0)
MCHC: 34.8 g/dL (ref 30.0–36.0)
MCV: 95.7 fL (ref 78.0–100.0)
PLATELETS: 137 10*3/uL — AB (ref 150–400)
RBC: 4.39 MIL/uL (ref 4.22–5.81)
RDW: 12.1 % (ref 11.5–15.5)
WBC: 10 10*3/uL (ref 4.0–10.5)

## 2015-12-21 LAB — TYPE AND SCREEN
ABO/RH(D): B POS
ANTIBODY SCREEN: NEGATIVE

## 2015-12-21 LAB — SURGICAL PCR SCREEN
MRSA, PCR: NEGATIVE
Staphylococcus aureus: NEGATIVE

## 2015-12-21 LAB — GLUCOSE, CAPILLARY: Glucose-Capillary: 127 mg/dL — ABNORMAL HIGH (ref 65–99)

## 2015-12-21 LAB — ABO/RH: ABO/RH(D): B POS

## 2015-12-21 NOTE — Progress Notes (Addendum)
Anesthesia Chart Review:  Pt is a 60 year old male scheduled for R total hip arthroplasty on 01/02/2016 with Dr. Alain Marion.   PMH includes:  HTN, DM, hyperlipidemia, RA, hepatitis (B or C, pt can't remember), alcohol abuse, pancreatitis, GERD. Current smoker. BMI 27  Medications include: lisinopril-hctz, metformin, metoprolol  Preoperative labs reviewed.    EKG 12/21/15: Sinus bradycardia (50 bpm). Septal infarct, age undetermined. Cannot rule out Inferior infarct, age undetermined.  Cardiac cath 08/18/12 (care everywhere): - Left main coronary artery: Normal. - Left circumflex: Small OM1, tiny OM2. Tiny LPL1, LPL2, and LPL3. - Left anterior descending: Normal D1, normal D2, normal D3. The three diagonal branches originate close to each other. - Right coronary artery: Small codominant vessel, supplies a small PDA and a small acute marginal branch. - Left ventricular pressures: LVEDP ~ 10 mmHg.  - Impressions: Normal coronaries. No apparent complications.   No old EKG tracing in our system available for comparison, but EKG 08/18/12 report in care everywhere seems similar with sinus bradycardia, poor R wave progression and possible IMI noted.  Normal coronaries by cath 08/18/12.   Pt has appt with cardiologist Dr. Acie Fredrickson 12/28/15 for cardiac clearance. Will revisit chart after that appointment.   Willeen Cass, FNP-BC [facility]/Anesthesiology Phone: [phone] 12/21/2015 3:56 PM  Addendum:   Pt saw Dr. Acie Fredrickson with cardiology 12/28/15 and was cleared for surgery.   If no changes, I anticipate pt can proceed with surgery as scheduled.   Willeen Cass, FNP-BC [facility]/Anesthesiology Phone: [phone] 12/28/2015 12:49 PM

## 2015-12-21 NOTE — Progress Notes (Signed)
Had some "fluttering issues" yrs ago, 2008.  Went to see Dr. Doylene Canard, Myo stress done.  Was "cut loose" and has not been back since.  Denies any cardiac problems currently PCP is Dr. Alyson Ingles  -  LOV 10/2015 States he has "hep C or B, I can't remember".  Cmet to be done.

## 2015-12-21 NOTE — Pre-Procedure Instructions (Signed)
RENJI HUNTRESS  12/21/2015      [id number] [address] [place] [address] Phone: [phone] Fax: [phone]    Your procedure is scheduled on Tuesday, March 7th   Report to [facility] Admitting at 5:30 AM             (Posted Surgery time 7:30 - 9:55 am)   Call this number if you have problems the morning of surgery:  [phone]   Remember:  Do not eat food or drink liquids after midnight Monday.   Take these medicines the morning of surgery with A SIP OF WATER :  Xanax, Lopressor   Do not wear jewelry - no rings or watches.  Do not wear lotions or colognes.   You may NOT wear deodorant the day of surgery.              Men may shave face and neck.   Do not bring valuables to the hospital.  [facility] is not responsible for any belongings or valuables.  Contacts, dentures or bridgework may not be worn into surgery.  Leave your suitcase in the car.  After surgery it may be brought to your room.  For patients admitted to the hospital, discharge time will be determined by your treatment team.  Name and phone number of your driver:     Please read over the following fact sheets that you were given. Pain Booklet, Coughing and Deep Breathing, MRSA Information and Surgical Site Infection Prevention

## 2015-12-28 ENCOUNTER — Ambulatory Visit (INDEPENDENT_AMBULATORY_CARE_PROVIDER_SITE_OTHER): Payer: Medicaid Other | Admitting: Cardiovascular Disease

## 2015-12-28 ENCOUNTER — Encounter: Payer: Self-pay | Admitting: Cardiovascular Disease

## 2015-12-28 VITALS — BP 130/80 | HR 46 | Ht 67.0 in | Wt 168.1 lb

## 2015-12-28 DIAGNOSIS — R0789 Other chest pain: Secondary | ICD-10-CM

## 2015-12-28 DIAGNOSIS — I10 Essential (primary) hypertension: Secondary | ICD-10-CM

## 2015-12-28 NOTE — Progress Notes (Signed)
Cardiology Office Note   Date:  12/28/2015   ID:  [name], DOB 10/29/1955, MRN [id number]  PCP:  No primary care provider on file.  Cardiologist:   Royal Beirne, Wonda Cheng, MD   No chief complaint on file.  1. Essential hypertension 2. Hip pain 3. Hyperlipidemia    History of Present Illness: [name] is a 60 y.o. male who presents for pre-op visit prior to R  hip replacement  Has arthritis .  Previously saw De. [id number]. Had a normal stress test at that time    Has a few episodes of chest pain off and on for years Sharp pains several months  No exertional ,  Sharp.  Last a split second Still smokes ( recommended that he stop)   Does quite a bit of walking  - no CP or dyspnea with that. 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 Hyperlipidemia   . Alcohol abuse   . Tobacco user   . Osteoarthritis   . GERD (gastroesophageal reflux disease)   . Allergic rhinitis   . Rheumatoid arthritis(714.0)   . Hypertension   . Pancreatitis   . Diabetes mellitus without complication [facility])     'sometime last yr'--type 2  . Anxiety     With Post traumatic stress disorder  . Headache     "like migraines"  . Hepatitis     he thinks its hep b or c    Past Surgical History  Procedure Laterality Date  . Rtc      right shoulder  -- 2010  . Colonoscopy    . Wrist surgery      fusion with pins  2007  . Fracture surgery      cheek bone fracture      Current Outpatient Prescriptions  Medication Sig Dispense Refill  . ALPRAZolam (XANAX) 1 MG tablet Take 0.5 tablets (0.5 mg total) by mouth at bedtime as needed. Anxiety.Take 1/2  Tablet as needed twice a day and one tablet at bedtime. 30 tablet 0  . folic acid (FOLVITE) 1 MG tablet Take 1 tablet (1 mg total) by mouth daily.    Marland Kitchen lisinopril-hydrochlorothiazide (PRINZIDE,ZESTORETIC) 10-12.5 MG per tablet Take 1 tablet by mouth daily.    . metFORMIN (GLUCOPHAGE) 500 MG tablet Take 500 mg by  mouth daily with breakfast.    . metoprolol tartrate (LOPRESSOR) 25 MG tablet Take 25 mg by mouth 2 (two) times daily.      . Multiple Vitamin (MULTIVITAMIN WITH MINERALS) TABS Take 1 tablet by mouth daily.     No current facility-administered medications for this visit.    Allergies:   Nsaids    Social History:  The patient  reports that he has been smoking Cigarettes.  He has a 10 pack-year smoking history. He has never used smokeless tobacco. He reports that he drinks alcohol. He reports that he does not use illicit drugs.   Family History:  The patient's family history is not on file.    ROS:  Please see the history of present illness.    Review of Systems: Constitutional:  denies fever, chills, diaphoresis, appetite change and fatigue.  HEENT: denies photophobia, eye pain, redness, hearing loss, ear pain, congestion, sore throat, rhinorrhea, sneezing, neck pain, neck stiffness and tinnitus.  Respiratory: denies SOB, DOE, cough, chest tightness, and wheezing.  Cardiovascular: denies chest pain, palpitations and leg swelling.  Gastrointestinal: denies nausea, vomiting, abdominal  pain, diarrhea, constipation, blood in stool.  Genitourinary: denies dysuria, urgency, frequency, hematuria, flank pain and difficulty urinating.  Musculoskeletal: denies  myalgias, back pain, joint swelling, arthralgias and gait problem.   Skin: denies pallor, rash and wound.  Neurological: denies dizziness, seizures, syncope, weakness, light-headedness, numbness and headaches.   Hematological: denies adenopathy, easy bruising, personal or family bleeding history.  Psychiatric/ Behavioral: denies suicidal ideation, mood changes, confusion, nervousness, sleep disturbance and agitation.       All other systems are reviewed and negative.    PHYSICAL EXAM: VS:  BP 130/80 mmHg  Pulse 46  Ht 5\' 7"  (1.702 m)  Wt 168 lb 1.9 oz (76.259 kg)  BMI 26.33 kg/m2 , BMI Body mass index is 26.33 kg/(m^2). GEN:  Well nourished, well developed, in no acute distress HEENT: normal Neck: no JVD, carotid bruits, or masses Cardiac: RRR; no murmurs, rubs, or gallops,no edema  Respiratory:  clear to auscultation bilaterally, normal work of breathing GI: soft, nontender, nondistended, + BS MS: no deformity or atrophy Skin: warm and dry, no rash Neuro:  Strength and sensation are intact Psych: normal   EKG:  EKG is ordered today. The ekg ordered today demonstrates  Sinus bradycardia at 46. Otherwise normal   Recent Labs: 12/21/2015: ALT 26; BUN 6; Creatinine, Ser 0.90; Hemoglobin 14.6; Platelets 137*; Potassium 4.1; Sodium 135    Lipid Panel    Component Value Date/Time   CHOL 121 11/01/2012 0608   TRIG 290* 11/01/2012 0608   HDL 9* 11/01/2012 0608   CHOLHDL 13.4 11/01/2012 0608   VLDL 58* 11/01/2012 0608   LDLCALC 54 11/01/2012 0608      Wt Readings from Last 3 Encounters:  12/28/15 168 lb 1.9 oz (76.259 kg)  12/21/15 170 lb 3.1 oz (77.2 kg)  07/21/13 158 lb 6.4 oz (71.85 kg)      Other studies Reviewed: Additional studies/ records that were reviewed today include: . Review of the above records demonstrates:    ASSESSMENT AND PLAN:  1.  Preop evaluation: [name] presents for preoperative evaluation prior to hip surgery. He has atypical chest pain. He's had a stress test in the past which was negative. His chest pains are sharp and lasts only a split second. They're not related to exertion. These are not concerning for angina.   I suspect that these are more musculoskelatal   I think that he is at low risk for his upcoming hip replacement.  2. Essential hypertension: His blood pressure is normal. Continue current medications.  3. Hyperlipidemia: Continue current medications. He will be followed at the [place].  We'll see him on an as-needed basis.   Current medicines are reviewed at length with the patient today.  The patient does not have concerns regarding medicines.  The following  changes have been made:  no change  Labs/ tests ordered today include:  No orders of the defined types were placed in this encounter.     Disposition:   FU with me as needed.     [name], Wonda Cheng, MD  12/28/2015 9:23 AM    [facility] [place], [place], [place]  [address] Phone: [phone]; Fax: [phone]   [facility]  [address] [place], [place]  [address] [phone]   Fax [phone]

## 2015-12-28 NOTE — Patient Instructions (Signed)

## 2016-01-01 MED ORDER — TRANEXAMIC ACID 1000 MG/10ML IV SOLN
1000.0000 mg | INTRAVENOUS | Status: AC
  Administered 2016-01-02: 1000 mg via INTRAVENOUS
  Filled 2016-01-01: qty 10

## 2016-01-02 ENCOUNTER — Inpatient Hospital Stay (HOSPITAL_COMMUNITY): Payer: Medicaid Other | Admitting: Critical Care Medicine

## 2016-01-02 ENCOUNTER — Encounter (HOSPITAL_COMMUNITY)
Admission: RE | Disposition: A | Discharge status: Home Health | Payer: Self-pay | Source: Ambulatory Visit | Attending: Orthopedic Surgery

## 2016-01-02 ENCOUNTER — Inpatient Hospital Stay (HOSPITAL_COMMUNITY): Payer: Medicaid Other

## 2016-01-02 ENCOUNTER — Inpatient Hospital Stay (HOSPITAL_COMMUNITY)
Admission: RE | Admit: 2016-01-02 | Discharge: 2016-01-03 | DRG: 470 | Disposition: A | Discharge status: Home Health | Payer: Medicaid Other | Source: Ambulatory Visit | Attending: Orthopedic Surgery | Admitting: Orthopedic Surgery

## 2016-01-02 ENCOUNTER — Inpatient Hospital Stay (HOSPITAL_COMMUNITY): Payer: Medicaid Other | Admitting: Emergency Medicine

## 2016-01-02 ENCOUNTER — Encounter (HOSPITAL_COMMUNITY): Payer: Self-pay | Admitting: Critical Care Medicine

## 2016-01-02 DIAGNOSIS — N400 Enlarged prostate without lower urinary tract symptoms: Secondary | ICD-10-CM | POA: Diagnosis present

## 2016-01-02 DIAGNOSIS — M069 Rheumatoid arthritis, unspecified: Secondary | ICD-10-CM | POA: Diagnosis present

## 2016-01-02 DIAGNOSIS — I10 Essential (primary) hypertension: Secondary | ICD-10-CM | POA: Diagnosis present

## 2016-01-02 DIAGNOSIS — M1611 Unilateral primary osteoarthritis, right hip: Secondary | ICD-10-CM | POA: Diagnosis present

## 2016-01-02 DIAGNOSIS — Z720 Tobacco use: Secondary | ICD-10-CM

## 2016-01-02 DIAGNOSIS — E119 Type 2 diabetes mellitus without complications: Secondary | ICD-10-CM | POA: Diagnosis present

## 2016-01-02 DIAGNOSIS — G894 Chronic pain syndrome: Secondary | ICD-10-CM | POA: Diagnosis present

## 2016-01-02 DIAGNOSIS — F17210 Nicotine dependence, cigarettes, uncomplicated: Secondary | ICD-10-CM | POA: Diagnosis present

## 2016-01-02 DIAGNOSIS — M25551 Pain in right hip: Secondary | ICD-10-CM | POA: Diagnosis present

## 2016-01-02 DIAGNOSIS — E785 Hyperlipidemia, unspecified: Secondary | ICD-10-CM | POA: Diagnosis present

## 2016-01-02 DIAGNOSIS — K219 Gastro-esophageal reflux disease without esophagitis: Secondary | ICD-10-CM | POA: Diagnosis present

## 2016-01-02 DIAGNOSIS — F419 Anxiety disorder, unspecified: Secondary | ICD-10-CM | POA: Diagnosis present

## 2016-01-02 DIAGNOSIS — Z419 Encounter for procedure for purposes other than remedying health state, unspecified: Secondary | ICD-10-CM

## 2016-01-02 DIAGNOSIS — Z7984 Long term (current) use of oral hypoglycemic drugs: Secondary | ICD-10-CM

## 2016-01-02 HISTORY — PX: TOTAL HIP ARTHROPLASTY: SHX124

## 2016-01-02 LAB — GLUCOSE, CAPILLARY
GLUCOSE-CAPILLARY: 128 mg/dL — AB (ref 65–99)
GLUCOSE-CAPILLARY: 119 mg/dL — AB (ref 65–99)
Glucose-Capillary: 164 mg/dL — ABNORMAL HIGH (ref 65–99)
Glucose-Capillary: 126 mg/dL — ABNORMAL HIGH (ref 65–99)
Glucose-Capillary: 140 mg/dL — ABNORMAL HIGH (ref 65–99)

## 2016-01-02 SURGERY — ARTHROPLASTY, HIP, TOTAL, ANTERIOR APPROACH
Anesthesia: General | Laterality: Right

## 2016-01-02 MED ORDER — PROPOFOL 10 MG/ML IV BOLUS
INTRAVENOUS | Status: AC
  Filled 2016-01-02: qty 20

## 2016-01-02 MED ORDER — HYDRALAZINE HCL 20 MG/ML IJ SOLN
INTRAMUSCULAR | Status: AC
  Filled 2016-01-02: qty 1

## 2016-01-02 MED ORDER — METHOCARBAMOL 500 MG PO TABS
500.0000 mg | ORAL_TABLET | Freq: Four times a day (QID) | ORAL | Status: DC | PRN
  Administered 2016-01-03 (×2): 500 mg via ORAL
  Filled 2016-01-02 (×2): qty 1

## 2016-01-02 MED ORDER — CHLORHEXIDINE GLUCONATE 4 % EX LIQD: 60.0000 mL | Freq: Once | CUTANEOUS | Status: DC

## 2016-01-02 MED ORDER — HYDROMORPHONE HCL 1 MG/ML IJ SOLN
INTRAMUSCULAR | Status: AC
  Filled 2016-01-02: qty 1

## 2016-01-02 MED ORDER — HYDROMORPHONE HCL 1 MG/ML IJ SOLN
INTRAMUSCULAR | Status: AC
Start: 2016-01-02 — End: 2016-01-02
  Filled 2016-01-02: qty 1

## 2016-01-02 MED ORDER — ONDANSETRON HCL 4 MG/2ML IJ SOLN
INTRAMUSCULAR | Status: DC | PRN
  Administered 2016-01-02: 4 mg via INTRAVENOUS

## 2016-01-02 MED ORDER — MENTHOL 3 MG MT LOZG: 1.0000 | LOZENGE | OROMUCOSAL | Status: DC | PRN

## 2016-01-02 MED ORDER — FENTANYL CITRATE (PF) 250 MCG/5ML IJ SOLN
INTRAMUSCULAR | Status: AC
  Filled 2016-01-02: qty 5

## 2016-01-02 MED ORDER — MORPHINE SULFATE (PF) 2 MG/ML IV SOLN
INTRAVENOUS | Status: AC
  Filled 2016-01-02: qty 1

## 2016-01-02 MED ORDER — ALBUMIN HUMAN 5 % IV SOLN
INTRAVENOUS | Status: DC | PRN
  Administered 2016-01-02: 09:00:00 via INTRAVENOUS

## 2016-01-02 MED ORDER — LISINOPRIL-HYDROCHLOROTHIAZIDE 10-12.5 MG PO TABS: 1.0000 | ORAL_TABLET | Freq: Every day | ORAL | Status: DC

## 2016-01-02 MED ORDER — GLYCOPYRROLATE 0.2 MG/ML IJ SOLN
INTRAMUSCULAR | Status: AC
  Filled 2016-01-02: qty 3

## 2016-01-02 MED ORDER — MIDAZOLAM HCL 2 MG/2ML IJ SOLN
INTRAMUSCULAR | Status: AC
  Filled 2016-01-02: qty 2

## 2016-01-02 MED ORDER — ROCURONIUM BROMIDE 50 MG/5ML IV SOLN
INTRAVENOUS | Status: AC
  Filled 2016-01-02: qty 1

## 2016-01-02 MED ORDER — METOCLOPRAMIDE HCL 5 MG/ML IJ SOLN: 5.0000 mg | Freq: Three times a day (TID) | INTRAMUSCULAR | Status: DC | PRN

## 2016-01-02 MED ORDER — ALUM & MAG HYDROXIDE-SIMETH 200-200-20 MG/5ML PO SUSP: 30.0000 mL | ORAL | Status: DC | PRN

## 2016-01-02 MED ORDER — PROPOFOL 10 MG/ML IV BOLUS
INTRAVENOUS | Status: DC | PRN
  Administered 2016-01-02: 40 mg via INTRAVENOUS
  Administered 2016-01-02: 10 mg via INTRAVENOUS
  Administered 2016-01-02: 160 mg via INTRAVENOUS

## 2016-01-02 MED ORDER — POTASSIUM CHLORIDE IN NACL 20-0.45 MEQ/L-% IV SOLN
INTRAVENOUS | Status: DC
  Filled 2016-01-02: qty 1000

## 2016-01-02 MED ORDER — HYDRALAZINE HCL 20 MG/ML IJ SOLN
INTRAMUSCULAR | Status: DC | PRN
  Administered 2016-01-02 (×2): 5 mg via INTRAVENOUS

## 2016-01-02 MED ORDER — ACETAMINOPHEN 500 MG PO TABS
ORAL_TABLET | ORAL | Status: AC
  Filled 2016-01-02: qty 2

## 2016-01-02 MED ORDER — CEFAZOLIN SODIUM 1-5 GM-% IV SOLN
1.0000 g | Freq: Four times a day (QID) | INTRAVENOUS | Status: AC
  Administered 2016-01-02 (×2): 1 g via INTRAVENOUS
  Filled 2016-01-02 (×2): qty 50

## 2016-01-02 MED ORDER — CEFAZOLIN SODIUM-DEXTROSE 2-3 GM-% IV SOLR
2.0000 g | INTRAVENOUS | Status: AC
  Administered 2016-01-02: 2 g via INTRAVENOUS

## 2016-01-02 MED ORDER — ASPIRIN EC 325 MG PO TBEC
325.0000 mg | DELAYED_RELEASE_TABLET | Freq: Every day | ORAL | Status: DC
  Administered 2016-01-03: 325 mg via ORAL
  Filled 2016-01-02: qty 1

## 2016-01-02 MED ORDER — ACETAMINOPHEN 325 MG PO TABS: 650.0000 mg | ORAL_TABLET | Freq: Four times a day (QID) | ORAL | Status: DC | PRN

## 2016-01-02 MED ORDER — CEFAZOLIN SODIUM-DEXTROSE 2-3 GM-% IV SOLR
INTRAVENOUS | Status: AC
  Filled 2016-01-02: qty 50

## 2016-01-02 MED ORDER — ONDANSETRON HCL 4 MG PO TABS
4.0000 mg | ORAL_TABLET | Freq: Four times a day (QID) | ORAL | Status: DC | PRN
Start: 2016-01-02 — End: 2016-01-03

## 2016-01-02 MED ORDER — INSULIN ASPART 100 UNIT/ML ~~LOC~~ SOLN: 0.0000 [IU] | Freq: Every day | SUBCUTANEOUS | Status: DC

## 2016-01-02 MED ORDER — SORBITOL 70 % SOLN: 30.0000 mL | Freq: Every day | Status: DC | PRN

## 2016-01-02 MED ORDER — PROMETHAZINE HCL 25 MG/ML IJ SOLN: 6.2500 mg | INTRAMUSCULAR | Status: DC | PRN

## 2016-01-02 MED ORDER — FOLIC ACID 1 MG PO TABS
1.0000 mg | ORAL_TABLET | Freq: Every day | ORAL | Status: DC
  Administered 2016-01-03: 1 mg via ORAL
  Filled 2016-01-02: qty 1

## 2016-01-02 MED ORDER — ALPRAZOLAM 0.5 MG PO TABS: 0.5000 mg | ORAL_TABLET | Freq: Every evening | ORAL | Status: DC | PRN

## 2016-01-02 MED ORDER — METOPROLOL TARTRATE 25 MG PO TABS
25.0000 mg | ORAL_TABLET | Freq: Two times a day (BID) | ORAL | Status: DC
  Administered 2016-01-02 – 2016-01-03 (×2): 25 mg via ORAL
  Filled 2016-01-02 (×2): qty 1

## 2016-01-02 MED ORDER — 0.9 % SODIUM CHLORIDE (POUR BTL) OPTIME
TOPICAL | Status: DC | PRN
  Administered 2016-01-02: 1000 mL

## 2016-01-02 MED ORDER — HYDROMORPHONE HCL 1 MG/ML IJ SOLN
1.0000 mg | INTRAMUSCULAR | Status: DC | PRN
  Administered 2016-01-02 – 2016-01-03 (×3): 1 mg via INTRAVENOUS
  Filled 2016-01-02 (×3): qty 1

## 2016-01-02 MED ORDER — SODIUM CHLORIDE 0.9 % IJ SOLN
INTRAMUSCULAR | Status: AC
  Filled 2016-01-02: qty 10

## 2016-01-02 MED ORDER — PHENOL 1.4 % MT LIQD: 1.0000 | OROMUCOSAL | Status: DC | PRN

## 2016-01-02 MED ORDER — DIPHENHYDRAMINE HCL 12.5 MG/5ML PO ELIX: 12.5000 mg | ORAL_SOLUTION | ORAL | Status: DC | PRN

## 2016-01-02 MED ORDER — POLYETHYLENE GLYCOL 3350 17 G PO PACK: 17.0000 g | PACK | Freq: Every day | ORAL | Status: DC | PRN

## 2016-01-02 MED ORDER — SODIUM CHLORIDE 0.9 % IJ SOLN
INTRAMUSCULAR | Status: AC
Start: 2016-01-02 — End: 2016-01-02
  Filled 2016-01-02: qty 10

## 2016-01-02 MED ORDER — FENTANYL CITRATE (PF) 100 MCG/2ML IJ SOLN
INTRAMUSCULAR | Status: DC | PRN
  Administered 2016-01-02: 25 ug via INTRAVENOUS
  Administered 2016-01-02: 50 ug via INTRAVENOUS
  Administered 2016-01-02: 100 ug via INTRAVENOUS
  Administered 2016-01-02: 25 ug via INTRAVENOUS
  Administered 2016-01-02 (×2): 50 ug via INTRAVENOUS
  Administered 2016-01-02: 25 ug via INTRAVENOUS
  Administered 2016-01-02: 50 ug via INTRAVENOUS
  Administered 2016-01-02: 25 ug via INTRAVENOUS
  Administered 2016-01-02: 50 ug via INTRAVENOUS
  Administered 2016-01-02: 25 ug via INTRAVENOUS
  Administered 2016-01-02: 50 ug via INTRAVENOUS
  Administered 2016-01-02 (×6): 25 ug via INTRAVENOUS
  Administered 2016-01-02: 50 ug via INTRAVENOUS
  Administered 2016-01-02: 25 ug via INTRAVENOUS

## 2016-01-02 MED ORDER — ONDANSETRON HCL 4 MG/2ML IJ SOLN: 4.0000 mg | Freq: Four times a day (QID) | INTRAMUSCULAR | Status: DC | PRN

## 2016-01-02 MED ORDER — SUCCINYLCHOLINE CHLORIDE 20 MG/ML IJ SOLN
INTRAMUSCULAR | Status: AC
  Filled 2016-01-02: qty 1

## 2016-01-02 MED ORDER — HYDROMORPHONE HCL 1 MG/ML IJ SOLN
0.2500 mg | INTRAMUSCULAR | Status: DC | PRN
  Administered 2016-01-02 (×4): 0.5 mg via INTRAVENOUS

## 2016-01-02 MED ORDER — ADULT MULTIVITAMIN W/MINERALS CH
1.0000 | ORAL_TABLET | Freq: Every day | ORAL | Status: DC
  Administered 2016-01-03: 1 via ORAL
  Filled 2016-01-02: qty 1

## 2016-01-02 MED ORDER — GLYCOPYRROLATE 0.2 MG/ML IJ SOLN
INTRAMUSCULAR | Status: DC | PRN
  Administered 2016-01-02: 0.6 mg via INTRAVENOUS

## 2016-01-02 MED ORDER — DOCUSATE SODIUM 100 MG PO CAPS
100.0000 mg | ORAL_CAPSULE | Freq: Two times a day (BID) | ORAL | Status: DC
  Administered 2016-01-02 – 2016-01-03 (×2): 100 mg via ORAL
  Filled 2016-01-02 (×2): qty 1

## 2016-01-02 MED ORDER — KETOROLAC TROMETHAMINE 30 MG/ML IJ SOLN
INTRAMUSCULAR | Status: DC | PRN
  Administered 2016-01-02: 30 mg via INTRAVENOUS

## 2016-01-02 MED ORDER — MORPHINE SULFATE (PF) 2 MG/ML IV SOLN
2.0000 mg | INTRAVENOUS | Status: DC | PRN
  Administered 2016-01-02 (×2): 2 mg via INTRAVENOUS

## 2016-01-02 MED ORDER — FLEET ENEMA 7-19 GM/118ML RE ENEM: 1.0000 | ENEMA | Freq: Once | RECTAL | Status: DC | PRN

## 2016-01-02 MED ORDER — LACTATED RINGERS IV SOLN
INTRAVENOUS | Status: DC | PRN
  Administered 2016-01-02 (×3): via INTRAVENOUS

## 2016-01-02 MED ORDER — LIDOCAINE HCL (CARDIAC) 20 MG/ML IV SOLN
INTRAVENOUS | Status: AC
  Filled 2016-01-02: qty 5

## 2016-01-02 MED ORDER — NEOSTIGMINE METHYLSULFATE 10 MG/10ML IV SOLN
INTRAVENOUS | Status: DC | PRN
  Administered 2016-01-02: 4 mg via INTRAVENOUS

## 2016-01-02 MED ORDER — MORPHINE SULFATE (PF) 2 MG/ML IV SOLN: 1.0000 mg | INTRAVENOUS | Status: DC | PRN

## 2016-01-02 MED ORDER — MIDAZOLAM HCL 5 MG/5ML IJ SOLN
INTRAMUSCULAR | Status: DC | PRN
  Administered 2016-01-02: 2 mg via INTRAVENOUS

## 2016-01-02 MED ORDER — LISINOPRIL 10 MG PO TABS
10.0000 mg | ORAL_TABLET | Freq: Every day | ORAL | Status: DC
  Administered 2016-01-02 – 2016-01-03 (×2): 10 mg via ORAL
  Filled 2016-01-02 (×2): qty 1

## 2016-01-02 MED ORDER — SODIUM CHLORIDE 0.9 % IV SOLN
INTRAVENOUS | Status: DC
  Administered 2016-01-02: 18:00:00 via INTRAVENOUS

## 2016-01-02 MED ORDER — BUPIVACAINE HCL (PF) 0.25 % IJ SOLN
INTRAMUSCULAR | Status: AC
  Filled 2016-01-02: qty 30

## 2016-01-02 MED ORDER — METOCLOPRAMIDE HCL 5 MG PO TABS: 5.0000 mg | ORAL_TABLET | Freq: Three times a day (TID) | ORAL | Status: DC | PRN

## 2016-01-02 MED ORDER — METHOCARBAMOL 1000 MG/10ML IJ SOLN
500.0000 mg | INTRAVENOUS | Status: DC
  Filled 2016-01-02: qty 5

## 2016-01-02 MED ORDER — DEXAMETHASONE SODIUM PHOSPHATE 10 MG/ML IJ SOLN
10.0000 mg | Freq: Once | INTRAMUSCULAR | Status: AC
  Administered 2016-01-03: 10 mg via INTRAVENOUS
  Filled 2016-01-02: qty 1

## 2016-01-02 MED ORDER — ROCURONIUM BROMIDE 100 MG/10ML IV SOLN
INTRAVENOUS | Status: DC | PRN
  Administered 2016-01-02: 40 mg via INTRAVENOUS
  Administered 2016-01-02: 10 mg via INTRAVENOUS

## 2016-01-02 MED ORDER — EPHEDRINE SULFATE 50 MG/ML IJ SOLN
INTRAMUSCULAR | Status: AC
  Filled 2016-01-02: qty 1

## 2016-01-02 MED ORDER — SODIUM CHLORIDE FLUSH 0.9 % IV SOLN
INTRAVENOUS | Status: DC | PRN
  Administered 2016-01-02: 30 mL via INTRAVENOUS

## 2016-01-02 MED ORDER — OMEGA-3-ACID ETHYL ESTERS 1 G PO CAPS
1.0000 g | ORAL_CAPSULE | Freq: Every day | ORAL | Status: DC
  Administered 2016-01-03: 1 g via ORAL
  Filled 2016-01-02: qty 1

## 2016-01-02 MED ORDER — METHOCARBAMOL 1000 MG/10ML IJ SOLN
500.0000 mg | Freq: Four times a day (QID) | INTRAVENOUS | Status: DC | PRN
  Administered 2016-01-02: 500 mg via INTRAVENOUS
  Filled 2016-01-02: qty 5

## 2016-01-02 MED ORDER — OXYCODONE HCL 5 MG PO TABS
10.0000 mg | ORAL_TABLET | ORAL | Status: DC | PRN
  Administered 2016-01-02: 10 mg via ORAL
  Administered 2016-01-02 (×2): 15 mg via ORAL
  Administered 2016-01-03 (×4): 20 mg via ORAL
  Filled 2016-01-02 (×3): qty 4
  Filled 2016-01-02: qty 3
  Filled 2016-01-02: qty 4
  Filled 2016-01-02: qty 3

## 2016-01-02 MED ORDER — OXYCODONE HCL 5 MG PO TABS
ORAL_TABLET | ORAL | Status: AC
  Filled 2016-01-02: qty 2

## 2016-01-02 MED ORDER — BUPIVACAINE HCL (PF) 0.25 % IJ SOLN
INTRAMUSCULAR | Status: DC | PRN
  Administered 2016-01-02: 30 mL

## 2016-01-02 MED ORDER — HYDROCHLOROTHIAZIDE 12.5 MG PO CAPS
12.5000 mg | ORAL_CAPSULE | Freq: Every day | ORAL | Status: DC
  Administered 2016-01-02 – 2016-01-03 (×2): 12.5 mg via ORAL
  Filled 2016-01-02 (×2): qty 1

## 2016-01-02 MED ORDER — INSULIN ASPART 100 UNIT/ML ~~LOC~~ SOLN
3.0000 [IU] | Freq: Three times a day (TID) | SUBCUTANEOUS | Status: DC
  Administered 2016-01-02 – 2016-01-03 (×2): 3 [IU] via SUBCUTANEOUS

## 2016-01-02 MED ORDER — LIDOCAINE HCL (CARDIAC) 20 MG/ML IV SOLN
INTRAVENOUS | Status: DC | PRN
  Administered 2016-01-02: 60 mg via INTRAVENOUS

## 2016-01-02 MED ORDER — INSULIN ASPART 100 UNIT/ML ~~LOC~~ SOLN
0.0000 [IU] | Freq: Three times a day (TID) | SUBCUTANEOUS | Status: DC
  Administered 2016-01-03: 1 [IU] via SUBCUTANEOUS

## 2016-01-02 MED ORDER — ACETAMINOPHEN 500 MG PO TABS
1000.0000 mg | ORAL_TABLET | Freq: Once | ORAL | Status: AC
  Administered 2016-01-02: 1000 mg via ORAL

## 2016-01-02 MED ORDER — HYDROMORPHONE HCL 1 MG/ML IJ SOLN
INTRAMUSCULAR | Status: DC | PRN
  Administered 2016-01-02 (×2): 0.5 mg via INTRAVENOUS

## 2016-01-02 MED ORDER — ACETAMINOPHEN 650 MG RE SUPP: 650.0000 mg | Freq: Four times a day (QID) | RECTAL | Status: DC | PRN

## 2016-01-02 SURGICAL SUPPLY — 54 items
BAG DECANTER FOR FLEXI CONT (MISCELLANEOUS) IMPLANT
BLADE SAG 18X100X1.27 (BLADE) ×1 IMPLANT
BLADE SAW SGTL 18X1.27X75 (BLADE) IMPLANT
BLADE SURG ROTATE 9660 (MISCELLANEOUS) IMPLANT
CAPT HIP TOTAL 2 ×1 IMPLANT
CLSR STERI-STRIP ANTIMIC 1/2X4 (GAUZE/BANDAGES/DRESSINGS) ×2 IMPLANT
COVER PERINEAL POST (MISCELLANEOUS) ×2 IMPLANT
COVER SURGICAL LIGHT HANDLE (MISCELLANEOUS) ×2 IMPLANT
DRAPE C-ARM 42X72 X-RAY (DRAPES) ×1 IMPLANT
DRAPE STERI IOBAN 125X83 (DRAPES) ×2 IMPLANT
DRAPE U-SHAPE 47X51 STRL (DRAPES) ×4 IMPLANT
DRSG MEPILEX BORDER 4X8 (GAUZE/BANDAGES/DRESSINGS) ×2 IMPLANT
DURAPREP 26ML APPLICATOR (WOUND CARE) ×2 IMPLANT
ELECT BLADE 4.0 EZ CLEAN MEGAD (MISCELLANEOUS) ×2
ELECT REM PT RETURN 9FT ADLT (ELECTROSURGICAL) ×2
ELECTRODE BLDE 4.0 EZ CLN MEGD (MISCELLANEOUS) ×1 IMPLANT
ELECTRODE REM PT RTRN 9FT ADLT (ELECTROSURGICAL) ×1 IMPLANT
FACESHIELD WRAPAROUND (MASK) ×4 IMPLANT
FACESHIELD WRAPAROUND OR TEAM (MASK) ×2 IMPLANT
GLOVE BIO SURGEON STRL SZ7 (GLOVE) ×2 IMPLANT
GLOVE BIO SURGEON STRL SZ7.5 (GLOVE) ×2 IMPLANT
GLOVE BIOGEL PI IND STRL 7.0 (GLOVE) ×1 IMPLANT
GLOVE BIOGEL PI IND STRL 8 (GLOVE) ×1 IMPLANT
GLOVE BIOGEL PI INDICATOR 7.0 (GLOVE) ×1
GLOVE BIOGEL PI INDICATOR 8 (GLOVE) ×1
GOWN STRL REUS W/ TWL LRG LVL3 (GOWN DISPOSABLE) ×2 IMPLANT
GOWN STRL REUS W/ TWL XL LVL3 (GOWN DISPOSABLE) ×1 IMPLANT
GOWN STRL REUS W/TWL LRG LVL3 (GOWN DISPOSABLE) ×4
GOWN STRL REUS W/TWL XL LVL3 (GOWN DISPOSABLE) ×2
KIT BASIN OR (CUSTOM PROCEDURE TRAY) ×2 IMPLANT
KIT ROOM TURNOVER OR (KITS) ×2 IMPLANT
MANIFOLD NEPTUNE II (INSTRUMENTS) ×2 IMPLANT
NDL 18GX1X1/2 (RX/OR ONLY) (NEEDLE) IMPLANT
NDL SAFETY ECLIPSE 18X1.5 (NEEDLE) ×1 IMPLANT
NEEDLE 18GX1X1/2 (RX/OR ONLY) (NEEDLE) IMPLANT
NEEDLE HYPO 18GX1.5 SHARP (NEEDLE) ×2
NS IRRIG 1000ML POUR BTL (IV SOLUTION) ×2 IMPLANT
PACK TOTAL JOINT (CUSTOM PROCEDURE TRAY) ×2 IMPLANT
PACK UNIVERSAL I (CUSTOM PROCEDURE TRAY) ×2 IMPLANT
PAD ARMBOARD 7.5X6 YLW CONV (MISCELLANEOUS) ×2 IMPLANT
SPONGE LAP 18X18 X RAY DECT (DISPOSABLE) IMPLANT
SUT MNCRL AB 4-0 PS2 18 (SUTURE) ×3 IMPLANT
SUT MON AB 2-0 CT1 36 (SUTURE) ×2 IMPLANT
SUT VIC AB 0 CT1 27 (SUTURE) ×2
SUT VIC AB 0 CT1 27XBRD ANBCTR (SUTURE) ×1 IMPLANT
SUT VIC AB 1 CT1 27 (SUTURE) ×2
SUT VIC AB 1 CT1 27XBRD ANBCTR (SUTURE) ×1 IMPLANT
SYR 50ML LL SCALE MARK (SYRINGE) ×2 IMPLANT
SYRINGE 20CC LL (MISCELLANEOUS) IMPLANT
TOWEL OR 17X24 6PK STRL BLUE (TOWEL DISPOSABLE) ×2 IMPLANT
TOWEL OR 17X26 10 PK STRL BLUE (TOWEL DISPOSABLE) ×2 IMPLANT
TRAY FOLEY CATH 16FRSI W/METER (SET/KITS/TRAYS/PACK) IMPLANT
WATER STERILE IRR 1000ML POUR (IV SOLUTION) ×2 IMPLANT
YANKAUER SUCT BULB TIP NO VENT (SUCTIONS) ×2 IMPLANT

## 2016-01-02 NOTE — Brief Op Note (Signed)
01/02/2016  10:05 AM  PATIENT:  [name]  60 y.o. male  PRE-OPERATIVE DIAGNOSIS:  OA,AVN RIGHT HIP  POST-OPERATIVE DIAGNOSIS:  OA,AVN RIGHT HIP  PROCEDURE:  Procedure(s): TOTAL HIP ARTHROPLASTY ANTERIOR APPROACH (Right)  SURGEON:  Surgeon(s) and Role:    * Renette Butters, MD - Primary  PHYSICIAN ASSISTANT: Chriss Czar, PA-C  ASSISTANTS: [place] PA Student  ANESTHESIA:   local and general  EBL:  Total I/O In: [id number] [I.V.:2500; IV Piggyback:250] Out: 1200 [Urine:400; Blood:800]  BLOOD ADMINISTERED:none  DRAINS: none   LOCAL MEDICATIONS USED:  MARCAINE     SPECIMEN:  No Specimen  DISPOSITION OF SPECIMEN:  N/A  COUNTS:  YES  TOURNIQUET:  * No tourniquets in log *  DICTATION: .Other Dictation: Dictation Number unknown  PLAN OF CARE: Admit to inpatient   PATIENT DISPOSITION:  PACU - hemodynamically stable.   Delay start of Pharmacological VTE agent (>24hrs) due to surgical blood loss or risk of bleeding: yes

## 2016-01-02 NOTE — Op Note (Signed)
01/02/2016  9:18 AM  PATIENT:  [name]   MRN: [id number]  PRE-OPERATIVE DIAGNOSIS:  OA,AVN RIGHT HIP  POST-OPERATIVE DIAGNOSIS:  OA,AVN RIGHT HIP  PROCEDURE:  Procedure(s): TOTAL HIP ARTHROPLASTY ANTERIOR APPROACH  PREOPERATIVE INDICATIONS:    [name] is an 60 y.o. male who has a diagnosis of <principal problem not specified> and elected for surgical management after failing conservative treatment.  The risks benefits and alternatives were discussed with the patient including but not limited to the risks of nonoperative treatment, versus surgical intervention including infection, bleeding, nerve injury, periprosthetic fracture, the need for revision surgery, dislocation, leg length discrepancy, blood clots, cardiopulmonary complications, morbidity, mortality, among others, and they were willing to proceed.     OPERATIVE REPORT     SURGEON:   Estel Scholze, Ernesta Amble, MD    ASSISTANT:  Josh Chadwell, PA-C, She was present and scrubbed throughout the case, critical for completion in a timely fashion, and for retraction, instrumentation, and closure.     ANESTHESIA:  General    COMPLICATIONS:  None.     COMPONENTS:  [employer]ker acolade fit femur size 9 with a 36 mm +0 head ball and a PSL acetabular shell size 54 with a  polyethylene liner    PROCEDURE IN DETAIL:   The patient was met in the holding area and  identified.  The appropriate hip was identified and marked at the operative site.  The patient was then transported to the OR  and  placed under general anesthesia.  At that point, the patient was  placed in the supine position and  secured to the operating room table and all bony prominences padded. He received pre-operative antibiotics    The operative lower extremity was prepped from the iliac crest to the distal leg.  Sterile draping was performed.  Time out was performed prior to incision.      Skin incision was made just 2 cm lateral to the ASIS  extending in line with  the tensor fascia lata. Electrocautery was used to control all bleeders. I dissected down sharply to the fascia of the tensor fascia lata was confirmed that the muscle fibers beneath were running posteriorly. I then incised the fascia over the superficial tensor fascia lata in line with the incision. The fascia was elevated off the anterior aspect of the muscle the muscle was retracted posteriorly and protected throughout the case. I then used electrocautery to incise the tensor fascia lata fascia control and all bleeders. Immediately visible was the fat over top of the anterior neck and capsule.  I removed the anterior fat from the capsule and elevated the rectus muscle off of the anterior capsule. I then removed a large time of capsule. The retractors were then placed over the anterior acetabulum as well as around the superior and inferior neck.  I then removed a section of the femoral neck and a napkin ring fashion. Then used the power course to remove the femoral head from the acetabulum and thoroughly irrigated the acetabulum. I sized the femoral head.    I then exposed the deep acetabulum, cleared out any tissue including the ligamentum teres.   After adequate visualization, I excised the labrum, and then sequentially reamed.  I placed the trial acetabulum, which seated nicely, and then impacted the real cup into place.  Appropriate version and inclination was confirmed clinically matching their bony anatomy, and also with the use transverse acetabular ligament.  I placed a 57m screw in the posterior/superio position  with an excellent bite.    I then placed the polyethylene liner in place  I then abducted the leg and released the external rotators from the posterior femur allowing it to be easily delivered up lateral and anterior to the acetabulum for preparation of the femoral canal.    I then prepared the proximal femur using the cookie-cutter and then sequentially reamed and broached.  A  trial broach, neck, and head was utilized, and I reduced the hip and it was found to have excellent stability with functional range of motion..  I then impacted the real femoral prosthesis into place into the appropriate version, slightly anteverted to the normal anatomy, and I impacted the real head ball into place. The hip was then reduced and taken through functional range of motion and found to have excellent stability. Leg lengths were restored.  I then irrigated the hip copiously again with, and repaired the fascia with Vicryl, followed by monocryl for the subcutaneous tissue, Monocryl for the skin, Steri-Strips and sterile gauze. The patient was then awakened and returned to PACU in stable and satisfactory condition. There were no complications.  POST OPERATIVE PLAN: WBAT, DVT px: SCD's/TED and ASA 325  Edmonia Lynch, MD Orthopedic Surgeon [phone]   This note was generated using a template and dragon dictation system. In light of that, I have reviewed the note and all aspects of it are applicable to this case. Any dictation errors are due to the computerized dictation system.

## 2016-01-02 NOTE — Anesthesia Procedure Notes (Signed)
Procedure Name: Intubation Date/Time: 01/02/2016 7:29 AM Performed by: Merrilyn Puma B Pre-anesthesia Checklist: Patient identified, Emergency Drugs available, Timeout performed, Patient being monitored and Suction available Patient Re-evaluated:Patient Re-evaluated prior to inductionOxygen Delivery Method: Circle system utilized Preoxygenation: Pre-oxygenation with 100% oxygen Intubation Type: IV induction Ventilation: Mask ventilation without difficulty and Oral airway inserted - appropriate to patient size Laryngoscope Size: Mac and 4 Grade View: Grade II Tube type: Oral Tube size: 7.5 mm Number of attempts: 1 Airway Equipment and Method: Stylet Placement Confirmation: CO2 detector,  positive ETCO2,  ETT inserted through vocal cords under direct vision and breath sounds checked- equal and bilateral Secured at: 22 cm Tube secured with: Tape Dental Injury: Teeth and Oropharynx as per pre-operative assessment

## 2016-01-02 NOTE — Transfer of Care (Signed)
Immediate Anesthesia Transfer of Care Note  Patient: [name]  Procedure(s) Performed: Procedure(s): TOTAL HIP ARTHROPLASTY ANTERIOR APPROACH (Right)  Patient Location: PACU  Anesthesia Type:General  Level of Consciousness: awake, alert  and oriented  Airway & Oxygen Therapy: Patient Spontanous Breathing and Patient connected to nasal cannula oxygen  Post-op Assessment: Report given to RN, Post -op Vital signs reviewed and stable and Patient moving all extremities X 4  Post vital signs: Reviewed and stable  Last Vitals:  Filed Vitals:   01/02/16 0657  BP: 175/82  Pulse: 53  Temp: 36.7 C  Resp: 16    Complications: No apparent anesthesia complications

## 2016-01-02 NOTE — Interval H&P Note (Signed)
History and Physical Interval Note:  01/02/2016 7:20 AM  [name]  has presented today for surgery, with the diagnosis of OA,AVN RIGHT HIP  The various methods of treatment have been discussed with the patient and family. After consideration of risks, benefits and other options for treatment, the patient has consented to  Procedure(s): TOTAL HIP ARTHROPLASTY ANTERIOR APPROACH (Right) as a surgical intervention .  The patient's history has been reviewed, patient examined, no change in status, stable for surgery.  I have reviewed the patient's chart and labs.  Questions were answered to the patient's satisfaction.     Lacharles Altschuler D

## 2016-01-02 NOTE — Anesthesia Preprocedure Evaluation (Addendum)
Anesthesia Evaluation  Patient identified by MRN, date of birth, ID band Patient awake    Reviewed: Allergy & Precautions, NPO status , Patient's Chart, lab work & pertinent test results  Airway Mallampati: II  TM Distance: >3 FB     Dental  (+) Dental Advisory Given, Poor Dentition   Pulmonary Current Smoker,    breath sounds clear to auscultation       Cardiovascular hypertension, Pt. on medications  Rhythm:Regular Rate:Normal     Neuro/Psych  Headaches, Anxiety    GI/Hepatic GERD  ,(+) Hepatitis -  Endo/Other  diabetes, Type 2, Oral Hypoglycemic Agents  Renal/GU      Musculoskeletal   Abdominal   Peds  Hematology   Anesthesia Other Findings   Reproductive/Obstetrics                           Anesthesia Physical Anesthesia Plan  ASA: III  Anesthesia Plan: General   Post-op Pain Management:    Induction: Intravenous  Airway Management Planned: Oral ETT  Additional Equipment:   Intra-op Plan:   Post-operative Plan: Extubation in OR  Informed Consent: I have reviewed the patients History and Physical, chart, labs and discussed the procedure including the risks, benefits and alternatives for the proposed anesthesia with the patient or authorized representative who has indicated his/her understanding and acceptance.   Dental advisory given  Plan Discussed with: CRNA and Surgeon  Anesthesia Plan Comments:         Anesthesia Quick Evaluation

## 2016-01-02 NOTE — Evaluation (Signed)
Physical Therapy Evaluation Patient Details Name: [name] MRN: [id number] DOB: 1956-03-21 Today's Date: 01/02/2016   History of Present Illness  Patient is a 60 y/o male with hx of HLD, HTN, alcohol abuse, DM, hepatitis, pancreatitis s/p Rt THA.   Clinical Impression  Patient presents with pain and post surgical deficits RLE s/p Rt THA. Education provided on anterolateral hip precautions. Tolerated taking a few steps to the chair and instructed pt in exercises. Mobility limited by pain. Pt reports pain medication is not helping. Plans to discharge home with support from family. Will follow acutely to maximize independence and mobility prior to return home.    Follow Up Recommendations Home health PT;Supervision for mobility/OOB    Equipment Recommendations  Rolling walker with 5" wheels    Recommendations for Other Services OT consult     Precautions / Restrictions Precautions Precautions: Fall;Anterior Hip Precaution Comments: anterolateral hip precautions Restrictions Weight Bearing Restrictions: Yes RLE Weight Bearing: Weight bearing as tolerated      Mobility  Bed Mobility Overal bed mobility: Needs Assistance Bed Mobility: Supine to Sit     Supine to sit: Min assist;HOB elevated     General bed mobility comments: Assist to bring RLE to EOB. Increased time due to pain.  Transfers Overall transfer level: Needs assistance Equipment used: Rolling walker (2 wheeled) Transfers: Sit to/from Stand Sit to Stand: Min guard         General transfer comment: Min guard for safety. Cues for hand placement.   Ambulation/Gait Ambulation/Gait assistance: Min guard Ambulation Distance (Feet): 6 Feet Assistive device: Rolling walker (2 wheeled) Gait Pattern/deviations: Step-to pattern;Decreased stance time - right;Decreased step length - left   Gait velocity interpretation: Below normal speed for age/gender General Gait Details: Cues for gait sequencing. Increased WB  through BUEs due to pain.  Stairs            Wheelchair Mobility    Modified Rankin (Stroke Patients Only)       Balance Overall balance assessment: Needs assistance Sitting-balance support: Feet supported;Bilateral upper extremity supported Sitting balance-Leahy Scale: Good     Standing balance support: During functional activity Standing balance-Leahy Scale: Poor Standing balance comment: Reliant on RW for support.                              Pertinent Vitals/Pain Pain Assessment: Faces Faces Pain Scale: Hurts whole lot Pain Location: right hip Pain Descriptors / Indicators: Sore;Aching Pain Intervention(s): Monitored during session;Repositioned;Limited activity within patient's tolerance;Premedicated before session    Home Living Family/patient expects to be discharged to:: Private residence Living Arrangements: Other relatives (brother and daughter) Available Help at Discharge: Family Type of Home: House Home Access: Level entry     Home Layout: One level Home Equipment: None      Prior Function Level of Independence: Independent with assistive device(s)         Comments: Using SPC PTA.     Hand Dominance        Extremity/Trunk Assessment   Upper Extremity Assessment: Defer to OT evaluation           Lower Extremity Assessment: RLE deficits/detail RLE Deficits / Details: Limited AROM/strength secondary to pain.       Communication   Communication: No difficulties  Cognition Arousal/Alertness: Awake/alert Behavior During Therapy: WFL for tasks assessed/performed Overall Cognitive Status: Within Functional Limits for tasks assessed  General Comments      Exercises Total Joint Exercises Ankle Circles/Pumps: Both;15 reps;Supine Quad Sets: Both;10 reps;Supine Gluteal Sets: Both;10 reps;Supine      Assessment/Plan    PT Assessment Patient needs continued PT services  PT Diagnosis  Difficulty walking;Acute pain   PT Problem List Decreased strength;Pain;Decreased activity tolerance;Decreased balance;Decreased range of motion;Decreased mobility;Decreased knowledge of precautions;Impaired sensation  PT Treatment Interventions Balance training;Gait training;Therapeutic exercise;Therapeutic activities;Functional mobility training;Patient/family education;DME instruction   PT Goals (Current goals can be found in the Care Plan section) Acute Rehab PT Goals Patient Stated Goal: to return to independence PT Goal Formulation: With patient Time For Goal Achievement: 01/16/16 Potential to Achieve Goals: Good    Frequency 7X/week   Barriers to discharge        Co-evaluation               End of Session Equipment Utilized During Treatment: Gait belt Activity Tolerance: Patient tolerated treatment well;Patient limited by pain Patient left: in chair;with call bell/phone within reach Nurse Communication: Mobility status         Time: [id number] PT Time Calculation (min) (ACUTE ONLY): 17 min   Charges:   PT Evaluation $PT Eval Moderate Complexity: 1 Procedure     PT G Codes:        Aubryana Vittorio A Solenne Manwarren 01/02/2016, 3:50 PM Wray Kearns, [place], DPT [phone]

## 2016-01-03 ENCOUNTER — Encounter (HOSPITAL_COMMUNITY): Payer: Self-pay | Admitting: Orthopedic Surgery

## 2016-01-03 LAB — BASIC METABOLIC PANEL
Anion gap: 6 (ref 5–15)
BUN: 8 mg/dL (ref 6–20)
CO2: 22 mmol/L (ref 22–32)
Calcium: 8.1 mg/dL — ABNORMAL LOW (ref 8.9–10.3)
Chloride: 102 mmol/L (ref 101–111)
Creatinine, Ser: 0.92 mg/dL (ref 0.61–1.24)
GFR calc Af Amer: >60 mL/min (ref >60)
GLUCOSE: 138 mg/dL — AB (ref 65–99)
POTASSIUM: 3.8 mmol/L (ref 3.5–5.1)
Sodium: 130 mmol/L — ABNORMAL LOW (ref 135–145)

## 2016-01-03 LAB — CBC
HCT: 28.5 % — ABNORMAL LOW (ref 39.0–52.0)
Hemoglobin: 10.3 g/dL — ABNORMAL LOW (ref 13.0–17.0)
MCH: 34.1 pg — AB (ref 26.0–34.0)
MCHC: 36.1 g/dL — ABNORMAL HIGH (ref 30.0–36.0)
MCV: 94.4 fL (ref 78.0–100.0)
PLATELETS: 115 10*3/uL — AB (ref 150–400)
RBC: 3.02 MIL/uL — AB (ref 4.22–5.81)
RDW: 12.4 % (ref 11.5–15.5)
WBC: 10.4 10*3/uL (ref 4.0–10.5)

## 2016-01-03 LAB — GLUCOSE, CAPILLARY
GLUCOSE-CAPILLARY: 124 mg/dL — AB (ref 65–99)
Glucose-Capillary: 146 mg/dL — ABNORMAL HIGH (ref 65–99)
Glucose-Capillary: 122 mg/dL — ABNORMAL HIGH (ref 65–99)

## 2016-01-03 MED ORDER — OXYCODONE-ACETAMINOPHEN 10-325 MG PO TABS: 1.0000 | ORAL_TABLET | ORAL | Status: DC | PRN

## 2016-01-03 MED ORDER — ONDANSETRON HCL 4 MG PO TABS: 4.0000 mg | ORAL_TABLET | Freq: Three times a day (TID) | ORAL | Status: DC | PRN

## 2016-01-03 MED ORDER — ALPRAZOLAM 1 MG PO TABS: 1.0000 mg | ORAL_TABLET | Freq: Every evening | ORAL | Status: DC | PRN

## 2016-01-03 MED ORDER — ASPIRIN EC 325 MG PO TBEC: 325.0000 mg | DELAYED_RELEASE_TABLET | Freq: Every day | ORAL | Status: DC

## 2016-01-03 MED ORDER — MELOXICAM 15 MG PO TABS: 15.0000 mg | ORAL_TABLET | Freq: Every day | ORAL | Status: DC

## 2016-01-03 MED ORDER — DOCUSATE SODIUM 100 MG PO CAPS: 100.0000 mg | ORAL_CAPSULE | Freq: Two times a day (BID) | ORAL | Status: DC

## 2016-01-03 NOTE — Discharge Instructions (Signed)
INSTRUCTIONS AFTER JOINT REPLACEMENT  ° °o Remove items at home which could result in a fall. This includes throw rugs or furniture in walking pathways °o ICE to the affected joint every three hours while awake for 30 minutes at a time, for at least the first 3-5 days, and then as needed for pain and swelling.  Continue to use ice for pain and swelling. You may notice swelling that will progress down to the foot and ankle.  This is normal after surgery.  Elevate your leg when you are not up walking on it.   °o Continue to use the breathing machine you got in the hospital (incentive spirometer) which will help keep your temperature down.  It is common for your temperature to cycle up and down following surgery, especially at night when you are not up moving around and exerting yourself.  The breathing machine keeps your lungs expanded and your temperature down. ° ° °DIET:  As you were doing prior to hospitalization, we recommend a well-balanced diet. ° °DRESSING / WOUND CARE / SHOWERING ° °You may change your dressing 3-5 days after surgery.  Then change the dressing every day with sterile gauze.  Please use good hand washing techniques before changing the dressing.  Do not use any lotions or creams on the incision until instructed by your surgeon. ° °ACTIVITY ° °o Increase activity slowly as tolerated, but follow the weight bearing instructions below.   °o No driving for 6 weeks or until further direction given by your physician.  You cannot drive while taking narcotics.  °o No lifting or carrying greater than 10 lbs. until further directed by your surgeon. °o Avoid periods of inactivity such as sitting longer than an hour when not asleep. This helps prevent blood clots.  °o You may return to work once you are authorized by your doctor.  ° ° ° °WEIGHT BEARING  ° °Weight bearing as tolerated with assist device (walker, cane, etc) as directed, use it as long as suggested by your surgeon or therapist, typically at  least 4-6 weeks. ° ° °EXERCISES ° °Results after joint replacement surgery are often greatly improved when you follow the exercise, range of motion and muscle strengthening exercises prescribed by your doctor. Safety measures are also important to protect the joint from further injury. Any time any of these exercises cause you to have increased pain or swelling, decrease what you are doing until you are comfortable again and then slowly increase them. If you have problems or questions, call your caregiver or physical therapist for advice.  ° °Rehabilitation is important following a joint replacement. After just a few days of immobilization, the muscles of the leg can become weakened and shrink (atrophy).  These exercises are designed to build up the tone and strength of the thigh and leg muscles and to improve motion. Often times heat used for twenty to thirty minutes before working out will loosen up your tissues and help with improving the range of motion but do not use heat for the first two weeks following surgery (sometimes heat can increase post-operative swelling).  ° °These exercises can be done on a training (exercise) mat, on the floor, on a table or on a bed. Use whatever works the best and is most comfortable for you.    Use music or television while you are exercising so that the exercises are a pleasant break in your day. This will make your life better with the exercises acting as a break   in your routine that you can look forward to.   Perform all exercises about fifteen times, three times per day or as directed.  You should exercise both the operative leg and the other leg as well. ° °Exercises include: °  °• Quad Sets - Tighten up the muscle on the front of the thigh (Quad) and hold for 5-10 seconds.   °• Straight Leg Raises - With your knee straight (if you were given a brace, keep it on), lift the leg to 60 degrees, hold for 3 seconds, and slowly lower the leg.  Perform this exercise against  resistance later as your leg gets stronger.  °• Leg Slides: Lying on your back, slowly slide your foot toward your buttocks, bending your knee up off the floor (only go as far as is comfortable). Then slowly slide your foot back down until your leg is flat on the floor again.  °• Angel Wings: Lying on your back spread your legs to the side as far apart as you can without causing discomfort.  °• Hamstring Strength:  Lying on your back, push your heel against the floor with your leg straight by tightening up the muscles of your buttocks.  Repeat, but this time bend your knee to a comfortable angle, and push your heel against the floor.  You may put a pillow under the heel to make it more comfortable if necessary.  ° °A rehabilitation program following joint replacement surgery can speed recovery and prevent re-injury in the future due to weakened muscles. Contact your doctor or a physical therapist for more information on knee rehabilitation.  ° ° °CONSTIPATION ° °Constipation is defined medically as fewer than three stools per week and severe constipation as less than one stool per week.  Even if you have a regular bowel pattern at home, your normal regimen is likely to be disrupted due to multiple reasons following surgery.  Combination of anesthesia, postoperative narcotics, change in appetite and fluid intake all can affect your bowels.  ° °YOU MUST use at least one of the following options; they are listed in order of increasing strength to get the job done.  They are all available over the counter, and you may need to use some, POSSIBLY even all of these options:   ° °Drink plenty of fluids (prune juice may be helpful) and high fiber foods °Colace 100 mg by mouth twice a day  °Senokot for constipation as directed and as needed Dulcolax (bisacodyl), take with full glass of water  °Miralax (polyethylene glycol) once or twice a day as needed. ° °If you have tried all these things and are unable to have a bowel  movement in the first 3-4 days after surgery call either your surgeon or your primary doctor.   ° °If you experience loose stools or diarrhea, hold the medications until you stool forms back up.  If your symptoms do not get better within 1 week or if they get worse, check with your doctor.  If you experience "the worst abdominal pain ever" or develop nausea or vomiting, please contact the office immediately for further recommendations for treatment. ° ° °ITCHING:  If you experience itching with your medications, try taking only a single pain pill, or even half a pain pill at a time.  You can also use Benadryl over the counter for itching or also to help with sleep.  ° °TED HOSE STOCKINGS:  Use stockings on both legs until for at least 2 weeks or as   directed by physician office. They may be removed at night for sleeping.  MEDICATIONS:  See your medication summary on the After Visit Summary that nursing will review with you.  You may have some home medications which will be placed on hold until you complete the course of blood thinner medication.  It is important for you to complete the blood thinner medication as prescribed.  PRECAUTIONS:  If you experience chest pain or shortness of breath - call 911 immediately for transfer to the hospital emergency department.   If you develop a fever greater that 101 F, purulent drainage from wound, increased redness or drainage from wound, foul odor from the wound/dressing, or calf pain - CONTACT YOUR SURGEON.                                                   FOLLOW-UP APPOINTMENTS:  If you do not already have a post-op appointment, please call the office for an appointment to be seen by your surgeon.  Guidelines for how soon to be seen are listed in your After Visit Summary, but are typically between 1-4 weeks after surgery.  OTHER INSTRUCTIONS:   Knee Replacement:  Do not place pillow under knee, focus on keeping the knee straight while resting. CPM  instructions: 0-90 degrees, 2 hours in the morning, 2 hours in the afternoon, and 2 hours in the evening. Place foam block, curve side up under heel at all times except when in CPM or when walking.  DO NOT modify, tear, cut, or change the foam block in any way.  MAKE SURE YOU:   Understand these instructions.   Get help right away if you are not doing well or get worse.    Thank you for letting us be a part of your medical care team.  It is a privilege we respect greatly.  We hope these instructions will help you stay on track for a fast and full recovery!   INSTRUCTIONS AFTER JOINT REPLACEMENT   o Remove items at home which could result in a fall. This includes throw rugs or furniture in walking pathways o ICE to the affected joint every three hours while awake for 30 minutes at a time, for at least the first 3-5 days, and then as needed for pain and swelling.  Continue to use ice for pain and swelling. You may notice swelling that will progress down to the foot and ankle.  This is normal after surgery.  Elevate your leg when you are not up walking on it.   o Continue to use the breathing machine you got in the hospital (incentive spirometer) which will help keep your temperature down.  It is common for your temperature to cycle up and down following surgery, especially at night when you are not up moving around and exerting yourself.  The breathing machine keeps your lungs expanded and your temperature down.   DIET:  As you were doing prior to hospitalization, we recommend a well-balanced diet.  DRESSING / WOUND CARE / SHOWERING  You may change your dressing 3-5 days after surgery.  Then change the dressing every day with sterile gauze.  Please use good hand washing techniques before changing the dressing.  Do not use any lotions or creams on the incision until instructed by your surgeon.  ACTIVITY  o Increase activity slowly as  tolerated, but follow the weight bearing instructions below.    o No driving for 6 weeks or until further direction given by your physician.  You cannot drive while taking narcotics.  o No lifting or carrying greater than 10 lbs. until further directed by your surgeon. o Avoid periods of inactivity such as sitting longer than an hour when not asleep. This helps prevent blood clots.  o You may return to work once you are authorized by your doctor.     WEIGHT BEARING   Weight bearing as tolerated with assist device (walker, cane, etc) as directed, use it as long as suggested by your surgeon or therapist, typically at least 4-6 weeks.   EXERCISES  Results after joint replacement surgery are often greatly improved when you follow the exercise, range of motion and muscle strengthening exercises prescribed by your doctor. Safety measures are also important to protect the joint from further injury. Any time any of these exercises cause you to have increased pain or swelling, decrease what you are doing until you are comfortable again and then slowly increase them. If you have problems or questions, call your caregiver or physical therapist for advice.   Rehabilitation is important following a joint replacement. After just a few days of immobilization, the muscles of the leg can become weakened and shrink (atrophy).  These exercises are designed to build up the tone and strength of the thigh and leg muscles and to improve motion. Often times heat used for twenty to thirty minutes before working out will loosen up your tissues and help with improving the range of motion but do not use heat for the first two weeks following surgery (sometimes heat can increase post-operative swelling).   These exercises can be done on a training (exercise) mat, on the floor, on a table or on a bed. Use whatever works the best and is most comfortable for you.    Use music or television while you are exercising so that the exercises are a pleasant break in your day. This will make your  life better with the exercises acting as a break in your routine that you can look forward to.   Perform all exercises about fifteen times, three times per day or as directed.  You should exercise both the operative leg and the other leg as well.  Exercises include:    Quad Sets - Tighten up the muscle on the front of the thigh (Quad) and hold for 5-10 seconds.    Straight Leg Raises - With your knee straight (if you were given a brace, keep it on), lift the leg to 60 degrees, hold for 3 seconds, and slowly lower the leg.  Perform this exercise against resistance later as your leg gets stronger.   Leg Slides: Lying on your back, slowly slide your foot toward your buttocks, bending your knee up off the floor (only go as far as is comfortable). Then slowly slide your foot back down until your leg is flat on the floor again.   Angel Wings: Lying on your back spread your legs to the side as far apart as you can without causing discomfort.   Hamstring Strength:  Lying on your back, push your heel against the floor with your leg straight by tightening up the muscles of your buttocks.  Repeat, but this time bend your knee to a comfortable angle, and push your heel against the floor.  You may put a pillow under the heel to make it more  comfortable if necessary.   A rehabilitation program following joint replacement surgery can speed recovery and prevent re-injury in the future due to weakened muscles. Contact your doctor or a physical therapist for more information on knee rehabilitation.    CONSTIPATION  Constipation is defined medically as fewer than three stools per week and severe constipation as less than one stool per week.  Even if you have a regular bowel pattern at home, your normal regimen is likely to be disrupted due to multiple reasons following surgery.  Combination of anesthesia, postoperative narcotics, change in appetite and fluid intake all can affect your bowels.   YOU MUST use at  least one of the following options; they are listed in order of increasing strength to get the job done.  They are all available over the counter, and you may need to use some, POSSIBLY even all of these options:    Drink plenty of fluids (prune juice may be helpful) and high fiber foods Colace 100 mg by mouth twice a day  Senokot for constipation as directed and as needed Dulcolax (bisacodyl), take with full glass of water  Miralax (polyethylene glycol) once or twice a day as needed.  If you have tried all these things and are unable to have a bowel movement in the first 3-4 days after surgery call either your surgeon or your primary doctor.    If you experience loose stools or diarrhea, hold the medications until you stool forms back up.  If your symptoms do not get better within 1 week or if they get worse, check with your doctor.  If you experience "the worst abdominal pain ever" or develop nausea or vomiting, please contact the office immediately for further recommendations for treatment.   ITCHING:  If you experience itching with your medications, try taking only a single pain pill, or even half a pain pill at a time.  You can also use Benadryl over the counter for itching or also to help with sleep.   TED HOSE STOCKINGS:  Use stockings on both legs until for at least 2 weeks or as directed by physician office. They may be removed at night for sleeping.  MEDICATIONS:  See your medication summary on the After Visit Summary that nursing will review with you.  You may have some home medications which will be placed on hold until you complete the course of blood thinner medication.  It is important for you to complete the blood thinner medication as prescribed.  PRECAUTIONS:  If you experience chest pain or shortness of breath - call 911 immediately for transfer to the hospital emergency department.   If you develop a fever greater that 101 F, purulent drainage from wound, increased redness  or drainage from wound, foul odor from the wound/dressing, or calf pain - CONTACT YOUR SURGEON.                                                   FOLLOW-UP APPOINTMENTS:  If you do not already have a post-op appointment, please call the office for an appointment to be seen by your surgeon.  Guidelines for how soon to be seen are listed in your After Visit Summary, but are typically between 1-4 weeks after surgery.  OTHER INSTRUCTIONS:   Knee Replacement:  Do not place pillow under knee, focus on keeping  the knee straight while resting. CPM instructions: 0-90 degrees, 2 hours in the morning, 2 hours in the afternoon, and 2 hours in the evening. Place foam block, curve side up under heel at all times except when in CPM or when walking.  DO NOT modify, tear, cut, or change the foam block in any way.  MAKE SURE YOU:   Understand these instructions.   Get help right away if you are not doing well or get worse.    Thank you for letting us be a part of your medical care team.  It is a privilege we respect greatly.  We hope these instructions will help you stay on track for a fast and full recovery!

## 2016-01-03 NOTE — Progress Notes (Signed)
Discharge instructions given. Pt verbalized understanding and all questions were answered.  

## 2016-01-03 NOTE — Progress Notes (Signed)
Physical Therapy Treatment Patient Details Name: [name] MRN: [id number] DOB: May 20, 1956 Today's Date: 01/03/2016    History of Present Illness Patient is a 60 y/o male with hx of HLD, HTN, alcohol abuse, DM, hepatitis, pancreatitis s/p Rt THA.     PT Comments    Patient continues to progress toward mobility goals. Stair training complete. Current plan remains appropriate.   Follow Up Recommendations  Home health PT;Supervision for mobility/OOB     Equipment Recommendations  Rolling walker with 5" wheels    Recommendations for Other Services OT consult     Precautions / Restrictions Precautions Precautions: Fall;Anterior Hip Precaution Comments: anterolateral hip precautions Restrictions Weight Bearing Restrictions: Yes RLE Weight Bearing: Weight bearing as tolerated    Mobility  Bed Mobility Overal bed mobility: Needs Assistance Bed Mobility: Supine to Sit;Sit to Supine     Supine to sit: Min guard Sit to supine: Min guard   General bed mobility comments: verbal cues for technique and hip precautions; HOB flat and no use of bedrails; increased time needed with use of "hook" technique with L LE  Transfers Overall transfer level: Needs assistance Equipment used: Rolling walker (2 wheeled) Transfers: Sit to/from Stand Sit to Stand: Min guard         General transfer comment: from EOB and recliner chair; cues for hand placement first trial; good technique; min guard for safety  Ambulation/Gait Ambulation/Gait assistance: Min guard Ambulation Distance (Feet): 130 Feet Assistive device: Rolling walker (2 wheeled) Gait Pattern/deviations: Step-through pattern;Antalgic;Trunk flexed;Decreased stance time - right;Decreased step length - left     General Gait Details: one seated rest break due to c/o pain; pt with improved WB and bilat step symmetry after initial ~76ft; cues for posture, sequencing, and R heel strike   Stairs Stairs: Yes Stairs assistance:  Min guard Stair Management: No rails;Backwards;With walker Number of Stairs: 2 General stair comments: educated pt on technique and sequencing; pt with good safety awareness; no unsteadiness  Wheelchair Mobility    Modified Rankin (Stroke Patients Only)       Balance     Sitting balance-Leahy Scale: Good       Standing balance-Leahy Scale: Poor                      Cognition Arousal/Alertness: Awake/alert Behavior During Therapy: WFL for tasks assessed/performed Overall Cognitive Status: Within Functional Limits for tasks assessed                      Exercises Total Joint Exercises Ankle Circles/Pumps: AROM;Both;10 reps;Supine Quad Sets: Both;10 reps;Supine Gluteal Sets: Both;10 reps;Supine    General Comments General comments (skin integrity, edema, etc.): pt recalled 3/3 hip precautions      Pertinent Vitals/Pain Pain Assessment: 0-10 Pain Score: 7  Pain Location: R hip Pain Descriptors / Indicators: Aching;Burning;Sore Pain Intervention(s): Limited activity within patient's tolerance;Repositioned;Monitored during session;Premedicated before session    Home Living                      Prior Function            PT Goals (current goals can now be found in the care plan section) Acute Rehab PT Goals Patient Stated Goal: to return to independence Progress towards PT goals: Progressing toward goals    Frequency  7X/week    PT Plan Current plan remains appropriate    Co-evaluation  End of Session Equipment Utilized During Treatment: Gait belt Activity Tolerance: Patient limited by pain Patient left: in chair;with call bell/phone within reach     Time: [id number] PT Time Calculation (min) (ACUTE ONLY): 26 min  Charges:  $Gait Training: 8-22 mins $Therapeutic Activity: 8-22 mins                    G Codes:      Salina April, PTA Pager: [phone]   01/03/2016, 3:23 PM

## 2016-01-03 NOTE — Care Management Note (Signed)
Case Management Note  Patient Details  Name: [name] MRN: [id number] Date of Birth: 09/27/1956  Subjective/Objective:     60 yr old male s/p right total hip arthroplasty               Action/Plan:   Expected Discharge Date:                  Expected Discharge Plan:  Register  In-House Referral:     Discharge planning Services  CM Consult  Post Acute Care Choice:  Durable Medical Equipment, Home Health Choice offered to:  Patient  DME Arranged:  3-N-1, Walker rolling, Tub bench DME Agency:  [place]:  PT [place]:  [place]  Status of Service:  Completed, signed off  Medicare Important Message Given:    Date Medicare IM Given:    Medicare IM give by:    Date Additional Medicare IM Given:    Additional Medicare Important Message give by:     If discussed at [place] of Stay Meetings, dates discussed:    Additional Comments:  Ninfa Meeker, RN 01/03/2016, 11:18 AM

## 2016-01-03 NOTE — Progress Notes (Signed)
Occupational Therapy Evaluation Patient Details Name: ESTES STREET MRN: [id number] DOB: Dec 26, 1955 Today's Date: 01/03/2016    History of Present Illness Patient is a 60 y/o male with hx of HLD, HTN, alcohol abuse, DM, hepatitis, pancreatitis s/p Rt THA.    Clinical Impression   PTA, pt was independent with ADLs and mobility. Pt currently requires min assist for LB ADLs and min guard assist for functional transfers. Educated pt on hip precautions, compensatory strategies for ADLs, energy conservation, and fall prevention strategies. Provided handout for tub transfer with use of tub bench and practiced/provided pt with AE for LB ADLs. Pt plans to d/c home with 24/7 assist from various family members. Pt will benefit from continued acute OT to increase independence and safety with ADLs and mobility to allow for safe discharge home. Recommend 3in1, tub bench, and RW for home use.    Follow Up Recommendations  No OT follow up;Supervision/Assistance - 24 hour    Equipment Recommendations  3 in 1 bedside comode;Tub/shower bench;Other (comment) (Adaptive equipment- provided)    Recommendations for Other Services       Precautions / Restrictions Precautions Precautions: Fall;Anterior Hip Precaution Booklet Issued: No Precaution Comments: Pt able to recall 2/3 hip precautions at beginning of session. Reviewed all precautions Restrictions Weight Bearing Restrictions: Yes RLE Weight Bearing: Weight bearing as tolerated      Mobility Bed Mobility Overal bed mobility: Needs Assistance Bed Mobility: Supine to Sit;Sit to Supine     Supine to sit: Min guard Sit to supine: Min assist   General bed mobility comments: HOB flat, no use of bedrails, exited on R side of bed to simulate home environment. Pt used L foot hooked underneath R ankle to elevate RLE off bed. Min guard for safety.  Transfers Overall transfer level: Needs assistance Equipment used: Rolling walker (2  wheeled) Transfers: Sit to/from Stand Sit to Stand: Min guard         General transfer comment: Min guard for safety. Verbal cues for safe hand placement and proper use of RW during transfers. No LOB or dizziness reported    Balance Overall balance assessment: Needs assistance Sitting-balance support: No upper extremity supported;Feet supported Sitting balance-Leahy Scale: Good     Standing balance support: No upper extremity supported;During functional activity Standing balance-Leahy Scale: Poor Standing balance comment: Reliant on UE support to maintain balance                            ADL Overall ADL's : Needs assistance/impaired     Grooming: Wash/dry hands;Standing;Min guard       Lower Body Bathing: Minimal assistance;Cueing for compensatory techniques;Sit to/from stand;Adhering to hip precautions Lower Body Bathing Details (indicate cue type and reason): Unable to reach bilateral feet; reviewed use of long-handled sponge Upper Body Dressing : Supervision/safety;Sitting   Lower Body Dressing: Minimal assistance;Cueing for compensatory techniques;Sit to/from stand;Adhering to hip precautions Lower Body Dressing Details (indicate cue type and reason): Unable to reach bilateral feet, practiced using reacher and sock aid Toilet Transfer: Min guard;Cueing for safety;Ambulation;RW Toilet Transfer Details (indicate cue type and reason): simulated to chair - no BSC in room. Cues to feel BSC on back of legs before reaching back to sit Toileting- Clothing Manipulation and Hygiene: Min guard;Sit to/from Nurse, children's Details (indicate cue type and reason): Discussed and provided handout for tub bench transfer Functional mobility during ADLs: Min guard;Rolling walker General ADL Comments: Educated  pt on anterolateral hip precautions, energy conservation, and fall prevention strategies. Practiced compensatory strategies for ADLs including use of AE for  LB ADLs. Practiced transfers and using safe hand placement and proper use of DME. Pt's brother, sister-in-law and friend present for OT eval     Vision Vision Assessment?: No apparent visual deficits   Perception     Praxis      Pertinent Vitals/Pain Pain Assessment: 0-10 Pain Score: 8  Pain Location: R hip Pain Descriptors / Indicators: Aching;Sore;Tightness Pain Intervention(s): Limited activity within patient's tolerance;Monitored during session;Ice applied;Premedicated before session;Repositioned     Hand Dominance Right   Extremity/Trunk Assessment Upper Extremity Assessment Upper Extremity Assessment: Overall WFL for tasks assessed   Lower Extremity Assessment Lower Extremity Assessment: RLE deficits/detail RLE Deficits / Details: decreased ROM and strength as expected post op   Cervical / Trunk Assessment Cervical / Trunk Assessment: Normal   Communication Communication Communication: No difficulties   Cognition Arousal/Alertness: Awake/alert Behavior During Therapy: WFL for tasks assessed/performed Overall Cognitive Status: Within Functional Limits for tasks assessed       Memory: Decreased recall of precautions             General Comments       Exercises       Shoulder Instructions      Home Living Family/patient expects to be discharged to:: Private residence Living Arrangements: Other relatives (brothers, sister-in-law, daughter) Available Help at Discharge: Family;Available 24 hours/day Type of Home: House Home Access: Level entry     Home Layout: One level     Bathroom Shower/Tub: Tub/shower unit;Curtain Shower/tub characteristics: Architectural technologist: Standard     Home Equipment: None          Prior Functioning/Environment Level of Independence: Independent with assistive device(s)        Comments: Using SPC PTA.    OT Diagnosis: Acute pain   OT Problem List: Decreased strength;Decreased range of motion;Decreased  activity tolerance;Impaired balance (sitting and/or standing);Decreased coordination;Decreased safety awareness;Decreased knowledge of use of DME or AE;Decreased knowledge of precautions;Pain   OT Treatment/Interventions: Self-care/ADL training;Therapeutic exercise;Energy conservation;DME and/or AE instruction;Therapeutic activities;Patient/family education;Balance training    OT Goals(Current goals can be found in the care plan section) Acute Rehab OT Goals Patient Stated Goal: to return to independence OT Goal Formulation: With patient Time For Goal Achievement: 01/17/16 Potential to Achieve Goals: Good ADL Goals Pt Will Perform Tub/Shower Transfer: Tub transfer;with supervision;ambulating;tub bench;rolling walker  OT Frequency: Min 2X/week   Barriers to D/C:            Co-evaluation              End of Session Equipment Utilized During Treatment: Gait belt;Rolling walker Nurse Communication: Mobility status  Activity Tolerance: Patient tolerated treatment well Patient left: in bed;with call bell/phone within reach;with family/visitor present   Time: [id number] OT Time Calculation (min): 26 min Charges:  OT General Charges $OT Visit: 1 Procedure OT Evaluation $OT Eval Moderate Complexity: 1 Procedure OT Treatments $Self Care/Home Management : 8-22 mins G-Codes:    Redmond Baseman, OTR/L Pager: [phone] 01/03/2016, 11:01 AM

## 2016-01-03 NOTE — Progress Notes (Addendum)
Physical Therapy Treatment Patient Details Name: [name] MRN: [id number] DOB: 05/26/1956 Today's Date: 01/03/2016    History of Present Illness Patient is a 60 y/o male with hx of HLD, HTN, alcohol abuse, DM, hepatitis, pancreatitis s/p Rt THA.     PT Comments    Patient is progressing toward mobility goals and recalled 2/3 hip precautions beginning of session. Given hip precaution handout and maintained throughout session. Patient needs to practice stairs next session.     Follow Up Recommendations  Home health PT;Supervision for mobility/OOB     Equipment Recommendations  Rolling walker with 5" wheels    Recommendations for Other Services OT consult     Precautions / Restrictions Precautions Precautions: Fall;Anterior Hip Precaution Comments: anterolateral hip precautions Restrictions Weight Bearing Restrictions: Yes RLE Weight Bearing: Weight bearing as tolerated    Mobility  Bed Mobility Overal bed mobility: Needs Assistance Bed Mobility: Supine to Sit     Supine to sit: Min guard     General bed mobility comments: min gaurd for safety; HOB and no use of bed rails; cues for sequencing and technique while maintiaing hip precuations  Transfers Overall transfer level: Needs assistance Equipment used: Rolling walker (2 wheeled) Transfers: Sit to/from Stand Sit to Stand: Min guard         General transfer comment: cues for hand placement and technique; min guard for safety  Ambulation/Gait Ambulation/Gait assistance: Min guard Ambulation Distance (Feet): 100 Feet Assistive device: Rolling walker (2 wheeled) Gait Pattern/deviations: Step-through pattern;Decreased stance time - right;Decreased step length - left;Decreased dorsiflexion - right;Decreased weight shift to right;Antalgic;Trunk flexed     General Gait Details: cues for posture, R heel strike, and sequencing; pt with improved ability to WS to R LE after inital ~75ft   Stairs             Wheelchair Mobility    Modified Rankin (Stroke Patients Only)       Balance Overall balance assessment: Needs assistance Sitting-balance support: No upper extremity supported;Feet supported Sitting balance-Leahy Scale: Good     Standing balance support: During functional activity Standing balance-Leahy Scale: Poor                      Cognition Arousal/Alertness: Awake/alert Behavior During Therapy: WFL for tasks assessed/performed Overall Cognitive Status: Within Functional Limits for tasks assessed                      Exercises      General Comments General comments (skin integrity, edema, etc.): pt recalled 2/3 hip precautions beginning of session; given hip precuation handout      Pertinent Vitals/Pain Pain Assessment: 0-10 Pain Score: 8  Pain Location: R hip Pain Descriptors / Indicators: Aching;Sore;Tightness Pain Intervention(s): Limited activity within patient's tolerance;Monitored during session;Premedicated before session;Ice applied;Repositioned;Patient requesting pain meds-RN notified    Home Living                      Prior Function            PT Goals (current goals can now be found in the care plan section) Acute Rehab PT Goals Patient Stated Goal: to return to independence Progress towards PT goals: Progressing toward goals    Frequency  7X/week    PT Plan Current plan remains appropriate    Co-evaluation             End of Session Equipment Utilized During Treatment: Gait  belt Activity Tolerance: Patient limited by pain Patient left: in chair;with call bell/phone within reach     Time: 0834-0900 PT Time Calculation (min) (ACUTE ONLY): 26 min  Charges:  $Gait Training: 8-22 mins $Therapeutic Activity: 8-22 mins                    G Codes:      Salina April, PTA Pager: [phone]   01/03/2016, 9:10 AM

## 2016-01-03 NOTE — Discharge Summary (Signed)
Physician Discharge Summary  Patient ID: [name] MRN: [id number] DOB/AGE: 1955/12/20 60 y.o.  Admit date: 01/02/2016 Discharge date: 01/03/2016  Admission Diagnoses:  <principal problem not specified>  Discharge Diagnoses:  Active Problems:   Primary localized osteoarthritis of right hip   Past Medical History  Diagnosis Date  . Hyperlipidemia   . Alcohol abuse   . Tobacco user   . Osteoarthritis   . GERD (gastroesophageal reflux disease)   . Allergic rhinitis   . Rheumatoid arthritis(714.0)   . Hypertension   . Pancreatitis   . 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 Anxiety     With Post traumatic stress disorder  . Headache     "like migraines"  . Hepatitis     he thinks its hep b or c    Surgeries: Procedure(s): TOTAL HIP ARTHROPLASTY ANTERIOR APPROACH on 01/02/2016   Consultants (if any):    Discharged Condition: Improved  Hospital Course: [name] is an 60 y.o. male who was admitted 01/02/2016 with a diagnosis of <principal problem not specified> and went to the operating room on 01/02/2016 and underwent the above named procedures.    He was given perioperative antibiotics:  Anti-infectives    Start     Dose/Rate Route Frequency Ordered Stop   01/02/16 1400  ceFAZolin (ANCEF) IVPB 1 g/50 mL premix     1 g 100 mL/hr over 30 Minutes Intravenous Every 6 hours 01/02/16 1339 01/02/16 2106   01/02/16 0643  ceFAZolin (ANCEF) 2-3 GM-% IVPB SOLR    Comments:  Forte, Lindsi   : cabinet override      01/02/16 0643 01/02/16 1859   01/02/16 0638  ceFAZolin (ANCEF) IVPB 2 g/50 mL premix     2 g 100 mL/hr over 30 Minutes Intravenous On call to O.R. 01/02/16 [id number] 01/02/16 0731    .  He was given sequential compression devices, early ambulation, and ASA 325 for DVT prophylaxis.  He benefited maximally from the hospital stay and there were no complications.    Recent vital signs:  Filed Vitals:   01/03/16 0057  01/03/16 0430  BP: 120/51 123/63  Pulse: 74 72  Temp: 99.2 F (37.3 C) 99.1 F (37.3 C)  Resp: 18 18    Recent laboratory studies:  Lab Results  Component Value Date   HGB 10.3* 01/03/2016   HGB 14.6 12/21/2015   HGB 13.3 11/03/2012   Lab Results  Component Value Date   WBC 10.4 01/03/2016   PLT PENDING 01/03/2016   Lab Results  Component Value Date   INR 1.07 12/21/2015   Lab Results  Component Value Date   NA 130* 01/03/2016   K 3.8 01/03/2016   CL 102 01/03/2016   CO2 22 01/03/2016   BUN 8 01/03/2016   CREATININE 0.92 01/03/2016   GLUCOSE 138* 01/03/2016    Discharge Medications:     Medication List    TAKE these medications        ALPRAZolam 1 MG tablet  Commonly known as:  XANAX  Take 0.5 tablets (0.5 mg total) by mouth at bedtime as needed. Anxiety.Take 1/2  Tablet as needed twice a day and one tablet at bedtime.     ALPRAZolam 1 MG tablet  Commonly known as:  XANAX  Take 1 tablet (1 mg total) by mouth at bedtime as needed for anxiety.     aspirin EC 325 MG tablet  Take 1 tablet (325 mg  total) by mouth daily.     docusate sodium 100 MG capsule  Commonly known as:  COLACE  Take 1 capsule (100 mg total) by mouth 2 (two) times daily. Continue this while taking narcotics to help with bowel movements     folic acid 1 MG tablet  Commonly known as:  FOLVITE  Take 1 tablet (1 mg total) by mouth daily.     lisinopril-hydrochlorothiazide 10-12.5 MG tablet  Commonly known as:  PRINZIDE,ZESTORETIC  Take 1 tablet by mouth daily.     meloxicam 15 MG tablet  Commonly known as:  MOBIC  Take 1 tablet (15 mg total) by mouth daily.     metFORMIN 500 MG tablet  Commonly known as:  GLUCOPHAGE  Take 500 mg by mouth daily with breakfast.     metoprolol tartrate 25 MG tablet  Commonly known as:  LOPRESSOR  Take 25 mg by mouth 2 (two) times daily.     multivitamin with minerals Tabs tablet  Take 1 tablet by mouth daily.     omega-3 acid ethyl esters 1 g  capsule  Commonly known as:  LOVAZA  Take 1 g by mouth daily.     ondansetron 4 MG tablet  Commonly known as:  ZOFRAN  Take 1 tablet (4 mg total) by mouth every 8 (eight) hours as needed for nausea.     oxyCODONE-acetaminophen 10-325 MG tablet  Commonly known as:  PERCOCET  Take 1 tablet by mouth every 4 (four) hours as needed for pain.        Diagnostic Studies: Dg Hip Operative Unilat W Or W/o Pelvis Right  01/02/2016  CLINICAL DATA:  Fluoro spot images from right anterior hip replacement EXAM: OPERATIVE right HIP (WITH PELVIS IF PERFORMED) 2 VIEWS TECHNIQUE: Fluoroscopic spot image(s) were submitted for interpretation post-operatively. COMPARISON:  None. FINDINGS: Fluoro time is reported is 14 seconds. The appearance of the prosthesis is normal. The interface with the native bone appears normal. No fracture of the native bony components is observed. IMPRESSION: No immediate postprocedure complication following anterior approach right total hip joint replacement. Electronically Signed   By: David  Martinique M.D.   On: 01/02/2016 09:23    Disposition: 01-Home or Self Care        Follow-up Information    Follow up with Ozan Maclay D, MD. Schedule an appointment as soon as possible for a visit in 2 weeks.   Specialty:  Orthopedic Surgery   Contact information:   [address] [place] [address] [phone]       Follow up with Renette Butters, MD.   Specialty:  Orthopedic Surgery   Why:  as scheduled   Contact information:   [address] [address] [phone]        Signed: Renette Butters 01/03/2016, 8:28 AM

## 2016-01-10 NOTE — Anesthesia Postprocedure Evaluation (Signed)
Anesthesia Post Note  Patient: GEORGI ORD  Procedure(s) Performed: Procedure(s) (LRB): TOTAL HIP ARTHROPLASTY ANTERIOR APPROACH (Right)  Patient location during evaluation: PACU Anesthesia Type: General Level of consciousness: awake and alert Pain management: pain level controlled (difficult pain scenario) Vital Signs Assessment: post-procedure vital signs reviewed and stable Respiratory status: spontaneous breathing, nonlabored ventilation, respiratory function stable and patient connected to nasal cannula oxygen Cardiovascular status: blood pressure returned to baseline and stable Postop Assessment: no signs of nausea or vomiting Anesthetic complications: no    Last Vitals:  Filed Vitals:   01/03/16 0430 01/03/16 1242  BP: 123/63 128/60  Pulse: 72 73  Temp: 37.3 C 36.9 C  Resp: 18 18    Last Pain:  Filed Vitals:   01/03/16 1618  PainSc: 3                  Gelila Well,JAMES TERRILL

## 2017-01-07 ENCOUNTER — Emergency Department (HOSPITAL_COMMUNITY)
Admission: EM | Admit: 2017-01-07 | Discharge: 2017-01-07 | Disposition: A | Discharge status: Home / Self Care | Payer: Medicaid Other | Attending: Emergency Medicine | Admitting: Emergency Medicine

## 2017-01-07 ENCOUNTER — Emergency Department (HOSPITAL_COMMUNITY): Payer: Medicaid Other

## 2017-01-07 ENCOUNTER — Encounter (HOSPITAL_COMMUNITY): Payer: Self-pay

## 2017-01-07 DIAGNOSIS — S161XXA Strain of muscle, fascia and tendon at neck level, initial encounter: Secondary | ICD-10-CM | POA: Diagnosis not present

## 2017-01-07 DIAGNOSIS — S4991XA Unspecified injury of right shoulder and upper arm, initial encounter: Secondary | ICD-10-CM | POA: Insufficient documentation

## 2017-01-07 DIAGNOSIS — S0990XA Unspecified injury of head, initial encounter: Secondary | ICD-10-CM | POA: Insufficient documentation

## 2017-01-07 DIAGNOSIS — Z96641 Presence of right artificial hip joint: Secondary | ICD-10-CM | POA: Diagnosis not present

## 2017-01-07 DIAGNOSIS — Y9389 Activity, other specified: Secondary | ICD-10-CM | POA: Insufficient documentation

## 2017-01-07 DIAGNOSIS — F17210 Nicotine dependence, cigarettes, uncomplicated: Secondary | ICD-10-CM | POA: Insufficient documentation

## 2017-01-07 DIAGNOSIS — S8991XA Unspecified injury of right lower leg, initial encounter: Secondary | ICD-10-CM | POA: Insufficient documentation

## 2017-01-07 DIAGNOSIS — S99911A Unspecified injury of right ankle, initial encounter: Secondary | ICD-10-CM | POA: Diagnosis not present

## 2017-01-07 DIAGNOSIS — Y999 Unspecified external cause status: Secondary | ICD-10-CM | POA: Diagnosis not present

## 2017-01-07 DIAGNOSIS — M25561 Pain in right knee: Secondary | ICD-10-CM

## 2017-01-07 DIAGNOSIS — M25511 Pain in right shoulder: Secondary | ICD-10-CM

## 2017-01-07 DIAGNOSIS — Z79899 Other long term (current) drug therapy: Secondary | ICD-10-CM | POA: Diagnosis not present

## 2017-01-07 DIAGNOSIS — I10 Essential (primary) hypertension: Secondary | ICD-10-CM | POA: Insufficient documentation

## 2017-01-07 DIAGNOSIS — E119 Type 2 diabetes mellitus without complications: Secondary | ICD-10-CM | POA: Insufficient documentation

## 2017-01-07 DIAGNOSIS — S199XXA Unspecified injury of neck, initial encounter: Secondary | ICD-10-CM | POA: Diagnosis present

## 2017-01-07 DIAGNOSIS — M25571 Pain in right ankle and joints of right foot: Secondary | ICD-10-CM

## 2017-01-07 DIAGNOSIS — Y92410 Unspecified street and highway as the place of occurrence of the external cause: Secondary | ICD-10-CM | POA: Diagnosis not present

## 2017-01-07 MED ORDER — OXYCODONE-ACETAMINOPHEN 5-325 MG PO TABS
1.0000 | ORAL_TABLET | Freq: Once | ORAL | Status: AC
  Administered 2017-01-07: 1 via ORAL
  Filled 2017-01-07: qty 1

## 2017-01-07 MED ORDER — METHOCARBAMOL 500 MG PO TABS: 500.0000 mg | ORAL_TABLET | Freq: Three times a day (TID) | ORAL | 0 refills | Status: DC | PRN
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
## 2017-01-07 NOTE — ED Notes (Signed)
Pt verbalized understanding of d/c instructions and has no further questions. Pt d/c home with family driving and pt requested to keep c-collar for comfort. VSS, NAD

## 2017-01-07 NOTE — ED Notes (Signed)
Patient transported to X-ray 
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
## 2017-01-07 NOTE — Discharge Instructions (Signed)

## 2017-01-15 DIAGNOSIS — M25561 Pain in right knee: Secondary | ICD-10-CM | POA: Diagnosis not present

## 2017-01-15 DIAGNOSIS — S93491A Sprain of other ligament of right ankle, initial encounter: Secondary | ICD-10-CM | POA: Diagnosis not present

## 2017-02-09 ENCOUNTER — Encounter (HOSPITAL_COMMUNITY): Payer: Self-pay | Admitting: Emergency Medicine

## 2017-02-09 ENCOUNTER — Observation Stay (HOSPITAL_COMMUNITY)
Admission: EM | Admit: 2017-02-09 | Discharge: 2017-02-10 | Disposition: A | Discharge status: Home / Self Care | Payer: Medicaid Other | Attending: Internal Medicine | Admitting: Internal Medicine

## 2017-02-09 DIAGNOSIS — G894 Chronic pain syndrome: Secondary | ICD-10-CM | POA: Diagnosis not present

## 2017-02-09 DIAGNOSIS — F1010 Alcohol abuse, uncomplicated: Secondary | ICD-10-CM | POA: Insufficient documentation

## 2017-02-09 DIAGNOSIS — R001 Bradycardia, unspecified: Secondary | ICD-10-CM | POA: Insufficient documentation

## 2017-02-09 DIAGNOSIS — E119 Type 2 diabetes mellitus without complications: Secondary | ICD-10-CM

## 2017-02-09 DIAGNOSIS — F17210 Nicotine dependence, cigarettes, uncomplicated: Secondary | ICD-10-CM | POA: Diagnosis not present

## 2017-02-09 DIAGNOSIS — I10 Essential (primary) hypertension: Secondary | ICD-10-CM | POA: Diagnosis not present

## 2017-02-09 DIAGNOSIS — G8921 Chronic pain due to trauma: Secondary | ICD-10-CM | POA: Diagnosis not present

## 2017-02-09 DIAGNOSIS — Z7984 Long term (current) use of oral hypoglycemic drugs: Secondary | ICD-10-CM | POA: Diagnosis not present

## 2017-02-09 DIAGNOSIS — E871 Hypo-osmolality and hyponatremia: Secondary | ICD-10-CM | POA: Diagnosis not present

## 2017-02-09 DIAGNOSIS — Z96641 Presence of right artificial hip joint: Secondary | ICD-10-CM | POA: Insufficient documentation

## 2017-02-09 DIAGNOSIS — T783XXA Angioneurotic edema, initial encounter: Secondary | ICD-10-CM | POA: Diagnosis not present

## 2017-02-09 DIAGNOSIS — Z79891 Long term (current) use of opiate analgesic: Secondary | ICD-10-CM

## 2017-02-09 DIAGNOSIS — Z79899 Other long term (current) drug therapy: Secondary | ICD-10-CM

## 2017-02-09 DIAGNOSIS — E785 Hyperlipidemia, unspecified: Secondary | ICD-10-CM | POA: Insufficient documentation

## 2017-02-09 DIAGNOSIS — Z888 Allergy status to other drugs, medicaments and biological substances status: Secondary | ICD-10-CM | POA: Diagnosis not present

## 2017-02-09 LAB — CBC WITH DIFFERENTIAL/PLATELET
BASOS ABS: 0 10*3/uL (ref 0.0–0.1)
Basophils Relative: 0 %
Eosinophils Absolute: 0.1 10*3/uL (ref 0.0–0.7)
Eosinophils Relative: 1 %
HEMATOCRIT: 43.2 % (ref 39.0–52.0)
HEMOGLOBIN: 15.5 g/dL (ref 13.0–17.0)
Lymphocytes Relative: 66 %
Lymphs Abs: 6.7 10*3/uL — ABNORMAL HIGH (ref 0.7–4.0)
MCH: 33.6 pg (ref 26.0–34.0)
MCHC: 35.9 g/dL (ref 30.0–36.0)
MCV: 93.7 fL (ref 78.0–100.0)
MONOS PCT: 8 %
Monocytes Absolute: 0.8 10*3/uL (ref 0.1–1.0)
NEUTROS ABS: 2.6 10*3/uL (ref 1.7–7.7)
Neutrophils Relative %: 25 %
Platelets: 163 10*3/uL (ref 150–400)
RBC: 4.61 MIL/uL (ref 4.22–5.81)
RDW: 12.6 % (ref 11.5–15.5)
WBC: 10.2 10*3/uL (ref 4.0–10.5)

## 2017-02-09 LAB — GLUCOSE, CAPILLARY
GLUCOSE-CAPILLARY: 154 mg/dL — AB (ref 65–99)
GLUCOSE-CAPILLARY: 182 mg/dL — AB (ref 65–99)
GLUCOSE-CAPILLARY: 158 mg/dL — AB (ref 65–99)

## 2017-02-09 LAB — BASIC METABOLIC PANEL
Anion gap: 10 (ref 5–15)
BUN: 8 mg/dL (ref 6–20)
CHLORIDE: 102 mmol/L (ref 101–111)
CO2: 21 mmol/L — ABNORMAL LOW (ref 22–32)
Calcium: 9.5 mg/dL (ref 8.9–10.3)
Creatinine, Ser: 0.86 mg/dL (ref 0.61–1.24)
GFR calc Af Amer: >60 mL/min (ref >60)
GFR calc non Af Amer: >60 mL/min (ref >60)
Glucose, Bld: 126 mg/dL — ABNORMAL HIGH (ref 65–99)
POTASSIUM: 3.5 mmol/L (ref 3.5–5.1)
SODIUM: 133 mmol/L — AB (ref 135–145)

## 2017-02-09 MED ORDER — DIPHENHYDRAMINE HCL 50 MG/ML IJ SOLN
25.0000 mg | Freq: Once | INTRAMUSCULAR | Status: AC
  Administered 2017-02-09: 25 mg via INTRAVENOUS
  Filled 2017-02-09: qty 1

## 2017-02-09 MED ORDER — DIPHENHYDRAMINE HCL 50 MG/ML IJ SOLN
25.0000 mg | Freq: Four times a day (QID) | INTRAMUSCULAR | Status: DC
  Administered 2017-02-09 – 2017-02-10 (×5): 25 mg via INTRAVENOUS
  Filled 2017-02-09 (×5): qty 1

## 2017-02-09 MED ORDER — HYDROCODONE-ACETAMINOPHEN 10-325 MG PO TABS
1.0000 | ORAL_TABLET | Freq: Four times a day (QID) | ORAL | Status: DC | PRN
  Administered 2017-02-09 – 2017-02-10 (×5): 1 via ORAL
  Filled 2017-02-09 (×5): qty 1

## 2017-02-09 MED ORDER — METHYLPREDNISOLONE SODIUM SUCC 125 MG IJ SOLR
60.0000 mg | Freq: Four times a day (QID) | INTRAMUSCULAR | Status: DC
  Administered 2017-02-09 – 2017-02-10 (×5): 60 mg via INTRAVENOUS
  Filled 2017-02-09 (×5): qty 2

## 2017-02-09 MED ORDER — METOPROLOL TARTRATE 25 MG PO TABS
25.0000 mg | ORAL_TABLET | ORAL | Status: DC
  Administered 2017-02-09: 25 mg via ORAL
  Filled 2017-02-09 (×2): qty 1

## 2017-02-09 MED ORDER — SODIUM CHLORIDE 0.9 % IV SOLN
INTRAVENOUS | Status: DC
  Administered 2017-02-09 – 2017-02-10 (×2): via INTRAVENOUS
  Administered 2017-02-10: 125 mL/h via INTRAVENOUS

## 2017-02-09 MED ORDER — INSULIN ASPART 100 UNIT/ML ~~LOC~~ SOLN
0.0000 [IU] | Freq: Three times a day (TID) | SUBCUTANEOUS | Status: DC
  Administered 2017-02-09 – 2017-02-10 (×3): 2 [IU] via SUBCUTANEOUS
  Administered 2017-02-10: 1 [IU] via SUBCUTANEOUS

## 2017-02-09 MED ORDER — FAMOTIDINE IN NACL 20-0.9 MG/50ML-% IV SOLN
20.0000 mg | Freq: Once | INTRAVENOUS | Status: AC
  Administered 2017-02-09: 20 mg via INTRAVENOUS
  Filled 2017-02-09: qty 50

## 2017-02-09 MED ORDER — OXYCODONE HCL 5 MG PO TABS
5.0000 mg | ORAL_TABLET | Freq: Four times a day (QID) | ORAL | Status: DC | PRN
  Administered 2017-02-09 – 2017-02-10 (×4): 5 mg via ORAL
  Filled 2017-02-09 (×5): qty 1

## 2017-02-09 MED ORDER — FAMOTIDINE IN NACL 20-0.9 MG/50ML-% IV SOLN
20.0000 mg | INTRAVENOUS | Status: DC
Start: 2017-02-10 — End: 2017-02-10
  Administered 2017-02-10: 20 mg via INTRAVENOUS
  Filled 2017-02-09: qty 50

## 2017-02-09 MED ORDER — EPINEPHRINE 0.3 MG/0.3ML IJ SOAJ
0.3000 mg | Freq: Once | INTRAMUSCULAR | Status: AC
  Administered 2017-02-09: 0.3 mg via INTRAMUSCULAR
  Filled 2017-02-09: qty 0.3

## 2017-02-09 MED ORDER — ENOXAPARIN SODIUM 40 MG/0.4ML ~~LOC~~ SOLN
40.0000 mg | SUBCUTANEOUS | Status: DC
  Administered 2017-02-09 – 2017-02-10 (×2): 40 mg via SUBCUTANEOUS
  Filled 2017-02-09 (×2): qty 0.4

## 2017-02-09 MED ORDER — METHYLPREDNISOLONE SODIUM SUCC 125 MG IJ SOLR
125.0000 mg | Freq: Once | INTRAMUSCULAR | Status: AC
  Administered 2017-02-09: 125 mg via INTRAVENOUS
  Filled 2017-02-09: qty 2

## 2017-02-09 MED ORDER — INSULIN ASPART 100 UNIT/ML ~~LOC~~ SOLN: 0.0000 [IU] | Freq: Every day | SUBCUTANEOUS | Status: DC

## 2017-02-09 MED ORDER — EPINEPHRINE 0.3 MG/0.3ML IJ SOAJ
0.3000 mg | Freq: Once | INTRAMUSCULAR | Status: DC | PRN
  Filled 2017-02-09: qty 0.3

## 2017-02-09 NOTE — ED Triage Notes (Signed)
Pt. Stated, I woke up my tongue was swollen, mt neck hurts all around.  Takes Lisinipril,. I had one oyster and some boiled peanuts last night, but it ususally dont bother me.

## 2017-02-09 NOTE — ED Notes (Signed)
Report called  
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
## 2017-02-09 NOTE — Progress Notes (Signed)
NURSING PROGRESS NOTE  [name] [id number] Transfer Data: 02/09/2017 12:36 PM Attending Provider: Aldine Contes, MD PCP:No PCP Per Patient Code Status: Full  [name] is a 61 y.o. male patient transferred from ED -No acute distress noted.  -No complaints of shortness of breath.  -No complaints of chest pain.   Blood pressure (!) 146/72, pulse (!) 53, temperature 98.1 F (36.7 C), temperature source Oral, resp. rate 18, height 5\' 7"  (1.702 m), weight 66.7 kg (147 lb), SpO2 94 %.   IV Fluids:  IV in place, occlusive dressing, Saline Locked  Allergies:  Nsaids  Past Medical History:   has a past medical history of Alcohol abuse; Allergic rhinitis; Anxiety; Diabetes mellitus without complication ([place]); GERD (gastroesophageal reflux disease); Headache; Hepatitis; Hyperlipidemia; Hypertension; Osteoarthritis; Pancreatitis; Rheumatoid arthritis(714.0); and Tobacco user.  Past Surgical History:   has a past surgical history that includes rtc; Colonoscopy; Wrist surgery; Fracture surgery; and Total hip arthroplasty (Right, 01/02/2016).  Social History:   reports that he has been smoking Cigarettes.  He has a 10.00 pack-year smoking history. He has never used smokeless tobacco. He reports that he drinks alcohol. He reports that he does not use drugs.  Skin: intact  Patient/Family orientated to room. Information packet given to patient/family. Admission inpatient armband information verified with patient/family to include name and date of birth and placed on patient arm. Side rails up x 2, fall assessment and education completed with patient/family. Patient/family able to verbalize understanding of risk associated with falls and verbalized understanding to call for assistance before getting out of bed. Call light within reach. Patient/family able to voice and demonstrate understanding of unit orientation instructions.    Will continue to evaluate and treat per MD orders.

## 2017-02-09 NOTE — ED Provider Notes (Signed)
[facility] DEPT Provider Note   CSN: [id number] Arrival date & time: 02/09/17  7017     History   Chief Complaint Chief Complaint  Patient presents with  . Allergic Reaction    HPI [name] is a 61 y.o. male.  Patient is a 61 year old male with a history of alcohol abuse, diabetes, hypertension and hyperlipidemia who presents with a swollen tongue. He states that he was doing fine yesterday and during the night and when he woke up this morning he noticed that his tongue was a little bit swollen. Throughout the morning and progressively got worse. He's having trouble swallowing his spit. He denies any shortness of breath or wheezing. He's feeling a little bit anxious over it. He denies any rash or itching. No chest pain or shortness of breath. No history of allergic reactions in the past. He states occasionally he gets some intermittent swelling of his jaw but it goes away spontaneously. He denies any prior history of medication or food allergies. He did eat an oyster last night and some peanuts but he has eaten these before without difficulty.      Past Medical History:  Diagnosis Date  . Alcohol abuse   . Allergic rhinitis   . Anxiety    With Post traumatic stress disorder  . Diabetes mellitus without complication [facility])    'sometime last yr'--type 2  . GERD (gastroesophageal reflux disease)   . Headache    "like migraines"  . Hepatitis    he thinks its hep b or c  . Hyperlipidemia   . Hypertension   . Osteoarthritis   . Pancreatitis   . Rheumatoid arthritis(714.0)   . Tobacco user     Patient Active Problem List   Diagnosis Date Noted  . Primary localized osteoarthritis of right hip 01/02/2016  . Groin rash 06/16/2013  . DM w/o complication type II ([place]) 06/16/2013  . BPH (benign prostatic hyperplasia) 11/24/2012  . Healthcare maintenance 11/24/2012  . Pancreatitis 10/31/2012  . Chronic pain syndrome 09/18/2011  . 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 Right  rotator cuff tear 07/26/2011  . Shoulder pain, right 05/03/2011  . Right ankle pain 01/23/2011  . Left wrist pain 01/23/2011  . Back pain 01/02/2011  . MOLLUSCUM CONTAGIOSUM 12/05/2008  . LEUKOCYTOSIS UNSPECIFIED 03/25/2008  . LIVER FUNCTION TESTS, ABNORMAL 03/25/2008  . ERECTILE DYSFUNCTION 05/12/2007  . HYPERTENSION 03/02/2007  . HYPERLIPIDEMIA 01/08/2007  . ANXIETY 01/08/2007  . GERD 01/08/2007  . RHEUMATOID ARTHRITIS 01/08/2007  . OSTEOARTHRITIS 01/08/2007    Past Surgical History:  Procedure Laterality Date  . COLONOSCOPY    . FRACTURE SURGERY     cheek bone fracture   . rtc     right shoulder  -- 2010  . 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   Procedure: TOTAL HIP ARTHROPLASTY ANTERIOR APPROACH;  Surgeon: Renette Butters, MD;  Location: [place];  Service: Orthopedics;  Laterality: Right;  . WRIST SURGERY     fusion with pins  2007       Home Medications    Prior to Admission medications   Medication Sig Start Date End Date Taking? Authorizing Provider  HYDROcodone-acetaminophen (NORCO) 10-325 MG tablet Take 1 tablet by mouth 4 (four) times daily as needed for moderate pain.    Yes Historical Provider, MD  lisinopril-hydrochlorothiazide (PRINZIDE,ZESTORETIC) 10-12.5 MG per tablet Take 1 tablet by mouth daily. 02/19/11  Yes Janell Quiet, MD  metFORMIN (GLUCOPHAGE) 500 MG tablet Take 500 mg by mouth daily with breakfast.  Yes Historical Provider, MD  metoprolol tartrate (LOPRESSOR) 25 MG tablet Take 25 mg by mouth every morning.    Yes Historical Provider, MD  omega-3 acid ethyl esters (LOVAZA) 1 g capsule Take 1 g by mouth daily.   Yes Historical Provider, MD  ALPRAZolam Duanne Moron) 1 MG tablet Take 0.5 tablets (0.5 mg total) by mouth at bedtime as needed. Anxiety.Take 1/2  Tablet as needed twice a day and one tablet at bedtime. Patient not taking: Reported on 01/07/2017 06/16/13   Jessee Avers, MD  ALPRAZolam Duanne Moron) 1 MG tablet Take 1 tablet (1 mg total) by mouth at  bedtime as needed for anxiety. Patient not taking: Reported on 01/07/2017 01/03/16   Renette Butters, MD  aspirin EC 325 MG tablet Take 1 tablet (325 mg total) by mouth daily. Patient not taking: Reported on 01/07/2017 01/03/16   Renette Butters, MD  docusate sodium (COLACE) 100 MG capsule Take 1 capsule (100 mg total) by mouth 2 (two) times daily. Continue this while taking narcotics to help with bowel movements Patient not taking: Reported on 01/07/2017 01/03/16   Renette Butters, MD  folic acid (FOLVITE) 1 MG tablet Take 1 tablet (1 mg total) by mouth daily. Patient not taking: Reported on 01/07/2017 11/03/12   Blain Pais, MD  meloxicam (MOBIC) 15 MG tablet Take 1 tablet (15 mg total) by mouth daily. Patient not taking: Reported on 01/07/2017 01/03/16   Renette Butters, MD  methocarbamol (ROBAXIN) 500 MG tablet Take 1 tablet (500 mg total) by mouth every 8 (eight) hours as needed for muscle spasms. Patient not taking: Reported on 02/09/2017 01/07/17   Margette Fast, MD  Multiple Vitamin (MULTIVITAMIN WITH MINERALS) TABS Take 1 tablet by mouth daily. Patient not taking: Reported on 01/07/2017 11/03/12   Blain Pais, MD  ondansetron (ZOFRAN) 4 MG tablet Take 1 tablet (4 mg total) by mouth every 8 (eight) hours as needed for nausea. Patient not taking: Reported on 01/07/2017 01/03/16   Renette Butters, MD  oxyCODONE-acetaminophen (PERCOCET) 10-325 MG tablet Take 1 tablet by mouth every 4 (four) hours as needed for pain. Patient not taking: Reported on 01/07/2017 01/03/16   Renette Butters, MD    Family History No family history on file.  Social History Social History  Substance Use Topics  . Smoking status: Current Every Day Smoker    Packs/day: 0.40    Years: 25.00    Types: Cigarettes  . Smokeless tobacco: Never Used     Comment: trying  . Alcohol use 0.0 oz/week    7 - 14 Cans of beer per week     Allergies   Nsaids   Review of Systems Review of Systems  Constitutional:  Negative for chills, diaphoresis, fatigue and fever.  HENT: Positive for facial swelling and trouble swallowing. Negative for congestion, rhinorrhea and sneezing.   Eyes: Negative.   Respiratory: Negative for cough, chest tightness and shortness of breath.   Cardiovascular: Negative for chest pain and leg swelling.  Gastrointestinal: Negative for abdominal pain, blood in stool, diarrhea, nausea and vomiting.  Genitourinary: Negative for difficulty urinating, flank pain, frequency and hematuria.  Musculoskeletal: Negative for arthralgias and back pain.  Skin: Negative for rash.  Neurological: Negative for dizziness, speech difficulty, weakness, numbness and headaches.  Psychiatric/Behavioral: Positive for agitation.     Physical Exam Updated Vital Signs BP (!) 150/72   Pulse 70   Temp 98.1 F (36.7 C) (Oral)   Resp (!) 23  Ht 5\' 7"  (1.702 m)   Wt 147 lb (66.7 kg)   SpO2 97%   BMI 23.02 kg/m   Physical Exam  Constitutional: He is oriented to person, place, and time. He appears well-developed and well-nourished.  HENT:  Head: Normocephalic and atraumatic.  Significant swelling of the tongue. There some mild swelling of the lips. He is having trouble swallowing his secretions  Eyes: Pupils are equal, round, and reactive to light.  Neck: Normal range of motion. Neck supple.  Cardiovascular: Normal rate, regular rhythm and normal heart sounds.   Pulmonary/Chest: Effort normal and breath sounds normal. No respiratory distress. He has no wheezes. He has no rales. He exhibits no tenderness.  Abdominal: Soft. Bowel sounds are normal. There is no tenderness. There is no rebound and no guarding.  Musculoskeletal: Normal range of motion. He exhibits no edema.  Lymphadenopathy:    He has no cervical adenopathy.  Neurological: He is alert and oriented to person, place, and time.  Skin: Skin is warm and dry. No rash noted.  Psychiatric: He has a normal mood and affect.     ED  Treatments / Results  Labs (all labs ordered are listed, but only abnormal results are displayed) Labs Reviewed  BASIC METABOLIC PANEL - Abnormal; Notable for the following:       Result Value   Sodium 133 (*)    CO2 21 (*)    Glucose, Bld 126 (*)    All other components within normal limits  CBC WITH DIFFERENTIAL/PLATELET - Abnormal; Notable for the following:    Lymphs Abs 6.7 (*)    All other components within normal limits    EKG  EKG Interpretation None       Radiology No results found.  Procedures Procedures (including critical care time)  Medications Ordered in ED Medications  0.9 %  sodium chloride infusion ( Intravenous New Bag/Given 02/09/17 0807)  diphenhydrAMINE (BENADRYL) injection 25 mg (25 mg Intravenous Given 02/09/17 0806)  EPINEPHrine (EPI-PEN) injection 0.3 mg (0.3 mg Intramuscular Given 02/09/17 0806)  famotidine (PEPCID) IVPB 20 mg premix (0 mg Intravenous Stopped 02/09/17 0907)  methylPREDNISolone sodium succinate (SOLU-MEDROL) 125 mg/2 mL injection 125 mg (125 mg Intravenous Given 02/09/17 0806)     Initial Impression / Assessment and Plan / ED Course  I have reviewed the triage vital signs and the nursing notes.  Pertinent labs & imaging results that were available during my care of the patient were reviewed by me and considered in my medical decision making (see chart for details).  Clinical Course as of Feb 10 955  Sun Feb 09, 2017  0813 Pt given epipen, solumedrol, benadryl and pepcid  [MB]  3976 Pt feels about the same.  No worsening tongue swelling, but no improvement  [MB]    Clinical Course User Index [MB] Malvin Johns, MD    Pt presents with angioedema, ?ACE inhibitor reaction.  He has had some improvement in the ED after medications. He is able to swallow his spit now. Before he was using suction. Now he is more comfortable and laying back on the bed. He notes a little bit of improvement although he still has significant tongue  swelling. He currently has no airway concern. I feel that he will need overnight observation. He previously has been seen by the internal medicine teaching service as an outpatient. He has not been seen there for several years. They are however up for unassigned and will admit the patient. I discussed the patient  with the internal medicine resident.  CRITICAL CARE Performed by: Cheyene Hamric Total critical care time: 60 minutes Critical care time was exclusive of separately billable procedures and treating other patients. Critical care was necessary to treat or prevent imminent or life-threatening deterioration. Critical care was time spent personally by me on the following activities: development of treatment plan with patient and/or surrogate as well as nursing, discussions with consultants, evaluation of patient's response to treatment, examination of patient, obtaining history from patient or surrogate, ordering and performing treatments and interventions, ordering and review of laboratory studies, ordering and review of radiographic studies, pulse oximetry and re-evaluation of patient's condition.   Final Clinical Impressions(s) / ED Diagnoses   Final diagnoses:  Angioedema, initial encounter    New Prescriptions New Prescriptions   No medications on file     Malvin Johns, MD 02/09/17 [phone]
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
## 2017-02-10 DIAGNOSIS — G894 Chronic pain syndrome: Secondary | ICD-10-CM

## 2017-02-10 DIAGNOSIS — Z886 Allergy status to analgesic agent status: Secondary | ICD-10-CM

## 2017-02-10 DIAGNOSIS — E119 Type 2 diabetes mellitus without complications: Secondary | ICD-10-CM | POA: Diagnosis not present

## 2017-02-10 DIAGNOSIS — I10 Essential (primary) hypertension: Secondary | ICD-10-CM | POA: Diagnosis not present

## 2017-02-10 DIAGNOSIS — Z888 Allergy status to other drugs, medicaments and biological substances status: Secondary | ICD-10-CM

## 2017-02-10 DIAGNOSIS — T783XXA Angioneurotic edema, initial encounter: Secondary | ICD-10-CM | POA: Diagnosis not present

## 2017-02-10 LAB — GLUCOSE, CAPILLARY
GLUCOSE-CAPILLARY: 144 mg/dL — AB (ref 65–99)
Glucose-Capillary: 198 mg/dL — ABNORMAL HIGH (ref 65–99)

## 2017-02-10 LAB — COMPREHENSIVE METABOLIC PANEL
ALBUMIN: 3.7 g/dL (ref 3.5–5.0)
ALK PHOS: 51 U/L (ref 38–126)
ALT: 14 U/L — AB (ref 17–63)
ANION GAP: 8 (ref 5–15)
AST: 22 U/L (ref 15–41)
BUN: 14 mg/dL (ref 6–20)
CO2: 21 mmol/L — AB (ref 22–32)
CREATININE: 0.78 mg/dL (ref 0.61–1.24)
Calcium: 9.1 mg/dL (ref 8.9–10.3)
Chloride: 104 mmol/L (ref 101–111)
GFR calc non Af Amer: >60 mL/min (ref >60)
GLUCOSE: 155 mg/dL — AB (ref 65–99)
Potassium: 4 mmol/L (ref 3.5–5.1)
SODIUM: 133 mmol/L — AB (ref 135–145)
Total Bilirubin: 0.4 mg/dL (ref 0.3–1.2)
Total Protein: 7.1 g/dL (ref 6.5–8.1)

## 2017-02-10 LAB — LIPID PANEL
CHOLESTEROL: 150 mg/dL (ref 0–200)
HDL: 43 mg/dL (ref >40)
LDL Cholesterol: 81 mg/dL (ref 0–99)
Total CHOL/HDL Ratio: 3.5 RATIO
Triglycerides: 132 mg/dL (ref <150)
VLDL: 26 mg/dL (ref 0–40)

## 2017-02-10 LAB — HEMOGLOBIN A1C
Hgb A1c MFr Bld: 5.8 % — ABNORMAL HIGH (ref 4.8–5.6)
Mean Plasma Glucose: 120 mg/dL

## 2017-02-10 LAB — C4 COMPLEMENT: COMPLEMENT C4, BODY FLUID: 21 mg/dL (ref 14–44)

## 2017-02-10 LAB — HIV ANTIBODY (ROUTINE TESTING W REFLEX): HIV SCREEN 4TH GENERATION: NONREACTIVE

## 2017-02-10 MED ORDER — HYDROCHLOROTHIAZIDE 12.5 MG PO CAPS: 12.5000 mg | ORAL_CAPSULE | Freq: Every day | ORAL | 0 refills | Status: DC

## 2017-02-10 MED ORDER — EPINEPHRINE 0.3 MG/0.3ML IJ SOAJ: 0.3000 mg | Freq: Once | INTRAMUSCULAR | 2 refills | Status: DC | PRN

## 2017-02-10 MED ORDER — PREDNISONE 20 MG PO TABS: 20.0000 mg | ORAL_TABLET | Freq: Every day | ORAL | 0 refills | Status: AC

## 2017-02-10 MED ORDER — HYDROCHLOROTHIAZIDE 12.5 MG PO CAPS
12.5000 mg | ORAL_CAPSULE | Freq: Every day | ORAL | Status: DC
  Administered 2017-02-10: 12.5 mg via ORAL
  Filled 2017-02-10: qty 1

## 2017-02-10 MED ORDER — AMLODIPINE BESYLATE 5 MG PO TABS: 5.0000 mg | ORAL_TABLET | Freq: Every day | ORAL | 0 refills | Status: DC

## 2017-02-10 NOTE — Progress Notes (Signed)
Dr. Gay Filler ordered to hold Metoprolol 25mg  po to hold medication. Arthor Captain LPN

## 2017-02-10 NOTE — Discharge Summary (Signed)
Name: [name] MRN: [id number] DOB: May 03, 1956 61 y.o. PCP: No Pcp Per Patient  Date of Admission: 02/09/2017  7:40 AM Date of Discharge: 02/10/2017 Attending Physician: Aldine Contes, MD  Discharge Diagnosis: 1. Angioedema of the Tongue Principal Problem:   Angioedema Active Problems:   Essential hypertension   Chronic pain syndrome   DM w/o complication type II [facility])   Discharge Medications: Allergies as of 02/10/2017      Reactions   Lisinopril    angioedema   Nsaids Other (See Comments)   Stomach Irritability       Medication List    STOP taking these medications   ALPRAZolam 1 MG tablet Commonly known as:  XANAX   aspirin EC 325 MG tablet   docusate sodium 100 MG capsule Commonly known as:  COLACE   folic acid 1 MG tablet Commonly known as:  FOLVITE   meloxicam 15 MG tablet Commonly known as:  MOBIC   methocarbamol 500 MG tablet Commonly known as:  ROBAXIN   metoprolol tartrate 25 MG tablet Commonly known as:  LOPRESSOR   multivitamin with minerals Tabs tablet   ondansetron 4 MG tablet Commonly known as:  ZOFRAN   oxyCODONE-acetaminophen 10-325 MG tablet Commonly known as:  PERCOCET     TAKE these medications   amLODipine 5 MG tablet Commonly known as:  NORVASC Take 1 tablet (5 mg total) by mouth daily.   EPINEPHrine 0.3 mg/0.3 mL Soaj injection Commonly known as:  EPI-PEN Inject 0.3 mLs (0.3 mg total) into the muscle once as needed (For anyphylaxis or severe angioedema.).   hydrochlorothiazide 12.5 MG capsule Commonly known as:  MICROZIDE Take 1 capsule (12.5 mg total) by mouth daily. Start taking on:  02/11/2017   HYDROcodone-acetaminophen 10-325 MG tablet Commonly known as:  NORCO Take 1 tablet by mouth 4 (four) times daily as needed for moderate pain.   metFORMIN 500 MG tablet Commonly known as:  GLUCOPHAGE Take 500 mg by mouth daily with breakfast.   omega-3 acid ethyl esters 1 g capsule Commonly known as:   LOVAZA Take 1 g by mouth daily.   predniSONE 20 MG tablet Commonly known as:  DELTASONE Take 1 tablet (20 mg total) by mouth daily with breakfast. Start taking on:  02/11/2017       Disposition and follow-up:   [name] was discharged from [facility] in Good condition.  At the hospital follow up visit please address:  1.   Angioedema: Please assess for maintained resolution of glossal edema. Patient should discontinue his ACE Inhibitor. He was prescribed an Epipen.  HTN: Lisinopril discontinued due to suspected cause of angioedema. Metoprolol held on discharge due to bradycardia. Discharge blood pressure medications are HCTZ 12.5 mg daily and Amlodipine 5 mg daily. Please assess BP control and heart rate.  2.  Labs / imaging needed at time of follow-up: none  3.  Pending labs/ test needing follow-up: HIV antibody, Hepatitis C antibody, Hepatitis B serology, C4 Complement  Follow-up Appointments: Follow-up Information    Dorena Dew, FNP. Go on 02/12/2017.   Specialty:  Family Medicine Why:  Appointment at 9:30 AM. Contact information: [place] [id number] [place] Hospital Course by problem list: Principal Problem:   Angioedema Active Problems:   Essential hypertension   Chronic pain syndrome   DM w/o complication type II (HCC)   Angioedema: Patient presented with angioedema of  tongue possibly secondary to ACE-Inhibitor use. He reported a family history of glossal edema in his maternal aunt. He had mild improvement with administration of Epipen, IV Solumedrol/Pepcid/Benadryl in the ED with continued glossal edema on admission. He did not compromise his airway or have stridor on exam. He initially had difficulty swallowing his oral secretions requiring suctioning, which improved with treatment in the ED. He was treated with scheduled IV Solumedrol 60 mg q6h, IV Benadryl 25 mg q6h, and IV Pepcid 20 mg  daily during admission. His Lisinopril was discontinued. On day of discharge (02/10/17) he had no glossal edema, shortness of breath, or difficulty controlling oral secretions. His speech was clear and he was tolerating oral intake well. A C4 complement level was checked. If low, consider C1 esterase deficiency for hereditary or acquired angioedema. Patient was prescribed a 5 day course of Prednisone 20 mg daily on discharge.  HTN: Lisinopril was discontinued for suspected cause of angioedema. His home metoprolol was held due to bradycardia on discharge. Patient's HCTZ 12.5 mg was continued on discharge with the addition of Amlodipine 5 mg daily due to elevated blood pressure with SBP of 160s on discharge.  Discharge Vitals:   BP (!) 161/79 (BP Location: Left Arm)   Pulse (!) 50   Temp 98.1 F (36.7 C) (Oral)   Resp 18   Ht 5\' 7"  (1.702 m)   Wt 147 lb (66.7 kg)   SpO2 100%   BMI 23.02 kg/m   Pertinent Labs, Studies, and Procedures:  HIV, Hep B serologies, Hep C ab in process on discharge.  Discharge Instructions: Discharge Instructions    Call MD for:  difficulty breathing, headache or visual disturbances    Complete by:  As directed    Call MD for:  extreme fatigue    Complete by:  As directed    Call MD for:  hives    Complete by:  As directed    Call MD for:  persistant dizziness or light-headedness    Complete by:  As directed    Call MD for:  persistant nausea and vomiting    Complete by:  As directed    Diet - low sodium heart healthy    Complete by:  As directed    Discharge instructions    Complete by:  As directed    Please assess for glossal edema or angioedema.  Please ensure patient has discontinued ACE Inhibitors and assess blood pressure control and bradycardia.   Increase activity slowly    Complete by:  As directed       Signed: Zada Finders, MD 02/10/2017, 2:18 PM

## 2017-02-10 NOTE — Discharge Instructions (Signed)
[name], you were treated for swelling of your tongue. This is likely from the blood pressure medication called Lisinopril you were taking. You should avoid taking Lisinopril or other medications in the class of ACE Inhibitors. Please also hold off on taking your Metoprolol as this may be slowing your heart rate.   Continue the following blood pressure medications: - Hydrochlorothiazide (HCTZ) 12.5 mg daily - Start Amlodipine 5 mg daily  Please call emergency services or come to the Emergency Department if you have significant tongue swelling again, trouble breathing, or swallowing.    Angioedema Angioedema is sudden swelling in the body. The swelling can happen in any part of the body. It often happens on the skin and causes itchy, bumpy patches (hives) to form. This condition may:  Happen only one time.  Happen more than one time. It may come back at random times.  Keep coming back for a number of years. Someday it may stop coming back. Follow these instructions at home:  Take over-the-counter and prescription medicines only as told by your doctor.  If you were given medicines for emergency allergy treatment, always carry them with you.  Wear a medical bracelet as told by your doctor.  Avoid the things that cause your attacks (triggers).  If this condition was passed to you from your parents and you want to have kids, talk to your doctor. Your kids may also have this condition. Contact a doctor if:  You have another attack.  Your attacks happen more often, even after you take steps to prevent them.  This condition was passed to you by your parents and you want to have kids. Get help right away if:  Your mouth, tongue, or lips get very swollen.  You have trouble breathing.  You have trouble swallowing.  You pass out (faint). This information is not intended to replace advice given to you by your health care provider. Make sure you discuss any questions you have with  your health care provider. Document Released: 10/02/2009 Document Revised: 05/15/2016 Document Reviewed: 04/23/2016 Elsevier Interactive Patient Education  2017 [employer].
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
## 2017-02-10 NOTE — Progress Notes (Signed)
[name] to be D/C'd Home per MD order.  Discussed with the patient and all questions fully answered.  VSS, Skin clean, dry and intact without evidence of skin break down, no evidence of skin tears noted. IV catheter discontinued intact. Site without signs and symptoms of complications. Dressing and pressure applied.  An After Visit Summary was printed and given to the patient. Patient received prescription.  D/c education completed with patient/family including follow up instructions, medication list, d/c activities limitations if indicated, with other d/c instructions as indicated by MD - patient able to verbalize understanding, all questions fully answered.   Patient instructed to return to ED, call 911, or call MD for any changes in condition.   Patient escorted via [place], and D/C home via private auto.  [name] 02/10/2017 3:29 PM

## 2017-02-10 NOTE — Discharge Summary (Signed)
Name: [name] MRN: [id number] DOB: Mar 12, 1956 61 y.o. PCP: No Pcp Per Patient  Date of Admission: 02/09/2017  7:40 AM Date of Discharge: 02/10/2017 Attending Physician: Aldine Contes, MD  Discharge Diagnosis: Principal Problem:   Angioedema Active Problems:   Essential hypertension   Chronic pain syndrome   DM w/o complication type II [facility])   Discharge Medications: Allergies as of 02/10/2017      Reactions   Lisinopril    angioedema   Nsaids Other (See Comments)   Stomach Irritability       Medication List    STOP taking these medications   ALPRAZolam 1 MG tablet Commonly known as:  XANAX   aspirin EC 325 MG tablet   docusate sodium 100 MG capsule Commonly known as:  COLACE   folic acid 1 MG tablet Commonly known as:  FOLVITE   meloxicam 15 MG tablet Commonly known as:  MOBIC   methocarbamol 500 MG tablet Commonly known as:  ROBAXIN   metoprolol tartrate 25 MG tablet Commonly known as:  LOPRESSOR   multivitamin with minerals Tabs tablet   ondansetron 4 MG tablet Commonly known as:  ZOFRAN   oxyCODONE-acetaminophen 10-325 MG tablet Commonly known as:  PERCOCET     TAKE these medications   amLODipine 5 MG tablet Commonly known as:  NORVASC Take 1 tablet (5 mg total) by mouth daily.   EPINEPHrine 0.3 mg/0.3 mL Soaj injection Commonly known as:  EPI-PEN Inject 0.3 mLs (0.3 mg total) into the muscle once as needed (For anyphylaxis or severe angioedema.).   hydrochlorothiazide 12.5 MG capsule Commonly known as:  MICROZIDE Take 1 capsule (12.5 mg total) by mouth daily. Start taking on:  02/11/2017   HYDROcodone-acetaminophen 10-325 MG tablet Commonly known as:  NORCO Take 1 tablet by mouth 4 (four) times daily as needed for moderate pain.   metFORMIN 500 MG tablet Commonly known as:  GLUCOPHAGE Take 500 mg by mouth daily with breakfast.   omega-3 acid ethyl esters 1 g capsule Commonly known as:  LOVAZA Take 1 g by mouth daily.     predniSONE 20 MG tablet Commonly known as:  DELTASONE Take 1 tablet (20 mg total) by mouth daily with breakfast. Start taking on:  02/11/2017       Disposition and follow-up:   [name] was discharged from [facility] in Good condition.  At the hospital follow up visit please address:  1. Start prednisone 20 mg daily for 5 days (4/17- 4/21). Epi-Pen prescribed for future angioedema or anaphylactic episodes as needed.  2. Discontinue home lisinopril-HCTZ and metoprolol. Start HCTZ 12.5 mg daily and amlodipine 5 mg daily  3. A1C during admission 5.8% at goal. Continue metformin 500 mg daily  4. HLD - FLP during admission at goal. Continue OTC omega 3 acids.  5.  Labs / imaging needed at time of follow-up: N/A  6.  Pending labs/ test needing follow-up:  - C4 complement, if low then check C1 esterase levels for hereditary angioedema - Hep B serologies and Hep C Ab for evaluation of unspecified hepatitis  Follow-up Appointments: Follow-up Information    Dorena Dew, FNP. Go on 02/12/2017.   Specialty:  Family Medicine Why:  Appointment at 9:30 AM. Contact information: [place]. [place] [address] [place] Hospital Course by problem list: Principal Problem:   Angioedema Active Problems:   Essential hypertension   Chronic pain  syndrome   DM w/o complication type II (HCC)   [name] is a 61 y.o. Male with PMHx HTN, HLD, GERD, T2DM, alcohol abuse, osteoarthritis s/p right hip replacement 12/2015, hepatitis (unspecified), and anxiety/PTSD who presents to the ED for tongue swelling with associated lip swelling.  ED Course: At presentation, patient was afebrile, bradycardic to 55, borderline tachypneic 20, hypertensive to 147/68, satting 100% on RA. CBC unremarkable without eosinophilia, BMP unremarkable except for mild hyponatremia 133. HIV Ab negative. Patient was given Epi-pen, IV methylprednisolone, IV  pepcid, IV benadryl, and maintenance IVFs, all of which improved patient's tongue swelling and speech. Patient was admitted on 02/09/2017 for further management of angioedema.  Patient was continued on IV methylprednisolone, Pepcid, diphenhydramine, and IVFs. Lisinopril-HCTZ discontinued. Patient remained on home metoprolol but patient was bradycardic to the 50's so was discontinued at discharge. At discharge, patient was hemodynamically stable with very minimal tongue swelling with airway intact. He was discharged on 02/10/2017.   Discharge Vitals:   BP (!) 161/79 (BP Location: Left Arm)   Pulse (!) 50   Temp 98.1 F (36.7 C) (Oral)   Resp 18   Ht 5\' 7"  (1.702 m)   Wt 66.7 kg (147 lb)   SpO2 100%   BMI 23.02 kg/m   Discharge Physical Exam: General: resting in bed HEENT: no glossal edema, oropharynx clear Cardiac: borderline bradycardia, no rubs, murmurs or gallops Pulm: clear to auscultation bilaterally, moving normal volumes of air Abd: soft, nontender, nondistended, BS present Ext: no pedal edema Neuro: alert and oriented X3   Pertinent Labs, Studies, and Procedures:   C4 complement pending   Hepatitis B surface Ab, surface Ag, Core Ab total, Core Ab IgM, Hep C Ab pending   FLP  Ref Range & Units 05:17 (02/10/17) 103yr ago (11/01/12) 68yr ago (10/03/10) 36yr ago (08/22/09) 50yr ago (04/14/09)    Cholesterol 0 - 200 mg/dL 150  121  129R, CM  135R, CM  141CM    Triglycerides <150 mg/dL 132  <150 mg/dL" class="z1wa hlt1024"> 290   <150) mg/dL" class="z1wa hlt1024"> 227R   <150) mg/dL" class="z1wa hlt1024"> 108R  <150 mg/dL" class="z1wb hlt1024"> 301     HDL >40 mg/dL 43  9R   51R  49R  37R     Total CHOL/HDL Ratio RATIO 3.5  13.4  2.5 RatioR  2.8 RatioR  3.8 RatioR    VLDL 0 - 40 mg/dL 26  58   45R   22R  60     LDL Cholesterol 0 - 99 mg/dL 81  54CM  33R  64R  44CM      A1C  Hgb A1c MFr Bld 4.8 - 5.6 % 5.8       Discharge Instructions: Discharge Instructions    Call  MD for:  difficulty breathing, headache or visual disturbances    Complete by:  As directed    Call MD for:  extreme fatigue    Complete by:  As directed    Call MD for:  hives    Complete by:  As directed    Call MD for:  persistant dizziness or light-headedness    Complete by:  As directed    Call MD for:  persistant nausea and vomiting    Complete by:  As directed    Diet - low sodium heart healthy    Complete by:  As directed    Discharge instructions    Complete by:  As directed    Please assess for  glossal edema or angioedema.  Please ensure patient has discontinued ACE Inhibitors and assess blood pressure control and bradycardia.   Increase activity slowly    Complete by:  As directed       Signed: Esaw Dace, Medical Student 02/10/2017, 1:55 PM   Pager: @[phone] @

## 2017-02-11 LAB — HEPATITIS B SURFACE ANTIGEN: Hepatitis B Surface Ag: NEGATIVE

## 2017-02-11 LAB — HEPATITIS B CORE ANTIBODY, TOTAL: HEP B C TOTAL AB: NEGATIVE

## 2017-02-11 LAB — HEPATITIS B CORE ANTIBODY, IGM: Hep B C IgM: NEGATIVE

## 2017-02-11 LAB — HEPATITIS C ANTIBODY

## 2017-02-11 LAB — HEPATITIS B SURFACE ANTIBODY,QUALITATIVE: Hep B S Ab: NONREACTIVE

## 2017-02-12 ENCOUNTER — Ambulatory Visit (INDEPENDENT_AMBULATORY_CARE_PROVIDER_SITE_OTHER): Payer: Medicaid Other | Admitting: Family Medicine

## 2017-02-12 ENCOUNTER — Encounter: Payer: Self-pay | Admitting: Family Medicine

## 2017-02-12 VITALS — BP 138/70 | HR 83 | Temp 98.2°F | Resp 16 | Ht 67.0 in | Wt 158.0 lb

## 2017-02-12 DIAGNOSIS — G629 Polyneuropathy, unspecified: Secondary | ICD-10-CM

## 2017-02-12 DIAGNOSIS — E118 Type 2 diabetes mellitus with unspecified complications: Secondary | ICD-10-CM

## 2017-02-12 DIAGNOSIS — M25551 Pain in right hip: Secondary | ICD-10-CM | POA: Diagnosis not present

## 2017-02-12 DIAGNOSIS — Z1211 Encounter for screening for malignant neoplasm of colon: Secondary | ICD-10-CM | POA: Diagnosis not present

## 2017-02-12 DIAGNOSIS — S79911S Unspecified injury of right hip, sequela: Secondary | ICD-10-CM

## 2017-02-12 DIAGNOSIS — I10 Essential (primary) hypertension: Secondary | ICD-10-CM | POA: Diagnosis not present

## 2017-02-12 DIAGNOSIS — Z23 Encounter for immunization: Secondary | ICD-10-CM

## 2017-02-12 DIAGNOSIS — E785 Hyperlipidemia, unspecified: Secondary | ICD-10-CM

## 2017-02-12 LAB — CBC WITH DIFFERENTIAL/PLATELET
Basophils Absolute: 0 cells/uL (ref 0–200)
Basophils Relative: 0 %
EOS PCT: 0 %
Eosinophils Absolute: 0 cells/uL — ABNORMAL LOW (ref 15–500)
HCT: 46.8 % (ref 38.5–50.0)
Hemoglobin: 16.1 g/dL (ref 13.2–17.1)
LYMPHS PCT: 9 %
Lymphs Abs: 1332 cells/uL (ref 850–3900)
MCH: 32.7 pg (ref 27.0–33.0)
MCHC: 34.4 g/dL (ref 32.0–36.0)
MCV: 94.9 fL (ref 80.0–100.0)
MPV: 9.6 fL (ref 7.5–12.5)
Monocytes Absolute: 1036 cells/uL — ABNORMAL HIGH (ref 200–950)
Monocytes Relative: 7 %
NEUTROS PCT: 84 %
Neutro Abs: 12432 cells/uL — ABNORMAL HIGH (ref 1500–7800)
Platelets: 160 10*3/uL (ref 140–400)
RBC: 4.93 MIL/uL (ref 4.20–5.80)
RDW: 13.3 % (ref 11.0–15.0)
WBC: 14.8 10*3/uL — AB (ref 3.8–10.8)

## 2017-02-12 LAB — COMPLETE METABOLIC PANEL WITH GFR
ALBUMIN: 4.4 g/dL (ref 3.6–5.1)
ALK PHOS: 71 U/L (ref 40–115)
ALT: 26 U/L (ref 9–46)
AST: 31 U/L (ref 10–35)
BILIRUBIN TOTAL: 0.6 mg/dL (ref 0.2–1.2)
BUN: 15 mg/dL (ref 7–25)
CO2: 26 mmol/L (ref 20–31)
CREATININE: 0.81 mg/dL (ref 0.70–1.25)
Calcium: 9.9 mg/dL (ref 8.6–10.3)
Chloride: 97 mmol/L — ABNORMAL LOW (ref 98–110)
GFR, Est African American: >89 mL/min (ref >=60)
GFR, Est Non African American: >89 mL/min (ref >=60)
Glucose, Bld: 112 mg/dL — ABNORMAL HIGH (ref 65–99)
Potassium: 3.7 mmol/L (ref 3.5–5.3)
Sodium: 134 mmol/L — ABNORMAL LOW (ref 135–146)
Total Protein: 7.8 g/dL (ref 6.1–8.1)

## 2017-02-12 LAB — POCT GLYCOSYLATED HEMOGLOBIN (HGB A1C): Hemoglobin A1C: 5.7

## 2017-02-12 LAB — POCT URINALYSIS DIP (DEVICE)
BILIRUBIN URINE: NEGATIVE
Glucose, UA: NEGATIVE mg/dL
Hgb urine dipstick: NEGATIVE
KETONES UR: NEGATIVE mg/dL
Nitrite: NEGATIVE
PH: 6 (ref 5.0–8.0)
PROTEIN: NEGATIVE mg/dL
Specific Gravity, Urine: 1.01 (ref 1.005–1.030)
Urobilinogen, UA: 0.2 mg/dL (ref 0.0–1.0)

## 2017-02-12 LAB — LIPID PANEL
CHOLESTEROL: 163 mg/dL (ref <200)
HDL: 64 mg/dL (ref >40)
LDL CALC: 70 mg/dL (ref <100)
TRIGLYCERIDES: 144 mg/dL (ref <150)
Total CHOL/HDL Ratio: 2.5 Ratio (ref <5.0)
VLDL: 29 mg/dL (ref <30)

## 2017-02-12 MED ORDER — GEMFIBROZIL 600 MG PO TABS: 600.0000 mg | ORAL_TABLET | Freq: Two times a day (BID) | ORAL | 1 refills | Status: DC

## 2017-02-12 MED ORDER — GABAPENTIN 300 MG PO CAPS: 300.0000 mg | ORAL_CAPSULE | Freq: Every day | ORAL | 1 refills | Status: DC

## 2017-02-12 MED ORDER — AMLODIPINE BESYLATE 5 MG PO TABS: 5.0000 mg | ORAL_TABLET | Freq: Every day | ORAL | 1 refills | Status: DC

## 2017-02-12 MED ORDER — HYDROCHLOROTHIAZIDE 12.5 MG PO CAPS: 12.5000 mg | ORAL_CAPSULE | Freq: Every day | ORAL | 1 refills | Status: DC

## 2017-02-12 MED ORDER — METFORMIN HCL 500 MG PO TABS: 500.0000 mg | ORAL_TABLET | Freq: Every day | ORAL | 1 refills | Status: DC

## 2017-02-12 MED ORDER — OMEGA-3-ACID ETHYL ESTERS 1 G PO CAPS: 1.0000 g | ORAL_CAPSULE | Freq: Every day | ORAL | 1 refills | Status: DC

## 2017-02-12 NOTE — Patient Instructions (Signed)
Dentist: Dr. Lutricia Feil [address]  Diabetes mellitus: Will send a referral for diabetic eye exam  Will send a referral to GI for colonoscopy
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
## 2017-02-13 LAB — MICROALBUMIN / CREATININE URINE RATIO
Creatinine, Urine: 21 mg/dL (ref 20–370)
MICROALB/CREAT RATIO: 467 ug/mg{creat} — AB (ref <30)
Microalb, Ur: 9.8 mg/dL

## 2017-03-31 DIAGNOSIS — M542 Cervicalgia: Secondary | ICD-10-CM | POA: Diagnosis not present

## 2017-03-31 DIAGNOSIS — M545 Low back pain: Secondary | ICD-10-CM | POA: Diagnosis not present

## 2017-03-31 DIAGNOSIS — M5416 Radiculopathy, lumbar region: Secondary | ICD-10-CM | POA: Diagnosis not present

## 2017-03-31 DIAGNOSIS — M5032 Other cervical disc degeneration, mid-cervical region, unspecified level: Secondary | ICD-10-CM | POA: Diagnosis not present

## 2017-04-17 ENCOUNTER — Ambulatory Visit: Payer: Medicaid Other | Attending: Family Medicine | Admitting: Physical Therapy

## 2017-04-17 DIAGNOSIS — M545 Low back pain: Secondary | ICD-10-CM | POA: Diagnosis not present

## 2017-04-17 DIAGNOSIS — R262 Difficulty in walking, not elsewhere classified: Secondary | ICD-10-CM | POA: Diagnosis present

## 2017-04-17 DIAGNOSIS — M25651 Stiffness of right hip, not elsewhere classified: Secondary | ICD-10-CM | POA: Diagnosis present

## 2017-04-17 DIAGNOSIS — M542 Cervicalgia: Secondary | ICD-10-CM | POA: Diagnosis present

## 2017-04-17 DIAGNOSIS — G8929 Other chronic pain: Secondary | ICD-10-CM

## 2017-04-18 ENCOUNTER — Encounter: Payer: Self-pay | Admitting: Physical Therapy
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
## 2017-05-02 ENCOUNTER — Ambulatory Visit: Payer: Medicaid Other | Attending: Family Medicine | Admitting: Physical Therapy

## 2017-05-02 DIAGNOSIS — M25651 Stiffness of right hip, not elsewhere classified: Secondary | ICD-10-CM | POA: Diagnosis present

## 2017-05-02 DIAGNOSIS — M542 Cervicalgia: Secondary | ICD-10-CM | POA: Insufficient documentation

## 2017-05-02 DIAGNOSIS — R262 Difficulty in walking, not elsewhere classified: Secondary | ICD-10-CM | POA: Insufficient documentation

## 2017-05-02 DIAGNOSIS — M545 Low back pain: Secondary | ICD-10-CM | POA: Insufficient documentation

## 2017-05-02 DIAGNOSIS — G8929 Other chronic pain: Secondary | ICD-10-CM | POA: Diagnosis present

## 2017-05-04 NOTE — Therapy (Signed)
[place], [place], [address] Phone: [phone]   Fax:  [phone]  Physical Therapy Treatment  Patient Details  Name: [name] MRN: [id number] Date of Birth: [birth year] Referring Provider: Dr Edmonia Lynch   Encounter Date: 05/02/2017      PT End of Session - 05/04/17 2122    Visit Number 2   Number of Visits 16   Date for PT Re-Evaluation 06/13/17   Authorization Type medicaid but per patient will be covered by car insurance    PT Start Time 0930   PT Stop Time 1014   PT Time Calculation (min) 44 min   Activity Tolerance Patient tolerated treatment well   Behavior During Therapy [facility] for tasks assessed/performed      Past Medical History:  Diagnosis Date  . Alcohol abuse   . Allergic rhinitis   . Anxiety    With Post traumatic stress disorder  . Diabetes mellitus without complication [facility])    'sometime last yr'--type 2  . GERD (gastroesophageal reflux disease)   . Headache    "like migraines"  . Hepatitis    he thinks its hep b or c  . Hyperlipidemia   . Hypertension   . Osteoarthritis   . Pancreatitis   . Rheumatoid arthritis(714.0)   . Tobacco user     Past Surgical History:  Procedure Laterality Date  . COLONOSCOPY    . FRACTURE SURGERY     cheek bone fracture   . rtc     right shoulder  -- 2010  . 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   Procedure: TOTAL HIP ARTHROPLASTY ANTERIOR APPROACH;  Surgeon: Renette Butters, MD;  Location: [place];  Service: Orthopedics;  Laterality: Right;  . WRIST SURGERY     fusion with pins  2007    There were no vitals filed for this visit.                       [place] Adult PT Treatment/Exercise - 05/04/17 0001      Neck Exercises: Supine   Other Supine Exercise shoulder ER x10 with wand; shoulder flexion with wand x10      Lumbar Exercises: Stretches   Single Knee to Chest Stretch Limitations single knee to chest  stretch 2x10    Lower Trunk Rotation Limitations x10    Piriformis Stretch Limitations 2x30 sec hold      Manual Therapy   Manual therapy comments trigger point release to bilateral upper traps; Gentle Cervical manual traction; PROM of the right shoulder; Gentle right hip stretching. Posterior mobilization of the left hip to improve motion.                   PT Short Term Goals - 05/04/17 2127      PT SHORT TERM GOAL #1   Title Patient will demsotrate a fair core contraction    Time 8   Period Weeks   Status On-going     PT SHORT TERM GOAL #2   Title Patient will increase bilateral lower extremity strength to 4/5    Time 8   Period Weeks   Status On-going     PT SHORT TERM GOAL #3   Title Patient will increase bilateral cervical rotation by 20 degrees bilateral    Time 8   Period Weeks   Status On-going           PT Long Term Goals -  04/18/17 0922      PT LONG TERM GOAL #1   Title Patient will stand for 1/2 hour without self report of pain in order to perfrom ADL's    Time 8   Period Weeks   Status New     PT LONG TERM GOAL #2   Title Patient will sit for 1/2 hour without increased pain in order to perfrom daily tasks    Time 8   Period Weeks   Status New     PT LONG TERM GOAL #3   Title Patient will demsotrate a 49% limiation on FOTO    Time 8   Period Weeks   Status New               Plan - 05/04/17 2124    Clinical Impression Statement Patient has significant stiffness in right shoulder. He has pain in his anterior shoulder and into his upper trap with movement of his shoulder. His back is moving better. His hip movement has improved although he still has significant pain. Therapy will attempt to get some improvement in shoudler range as it is likley effecting the stiffness in his neck as well.    Clinical Presentation Evolving   Clinical Decision Making Moderate   Rehab Potential Good   PT Frequency 2x / week   PT Duration 8 weeks    PT Treatment/Interventions ADLs/Self Care Home Management;Cryotherapy;Electrical Stimulation;Iontophoresis 4mg /ml Dexamethasone;Stair training;Gait training;Moist Heat;Ultrasound;Traction;Therapeutic activities;Therapeutic exercise;Patient/family education;Passive range of motion;Manual techniques;Neuromuscular re-education   PT Next Visit Plan manual therapy to cervical spine to decrease spasming; LAD to biulateral ower extremitys; consdier manual therapy to lumbar spine; Consdier abdominal breathing and wnad; consdier ex-stim and heat but only if we cant do other things.    PT Home Exercise Plan lateral trunk rotation; single knee to chest stretching;    Consulted and Agree with Plan of Care Patient      Patient will benefit from skilled therapeutic intervention in order to improve the following deficits and impairments:  Abnormal gait, Decreased activity tolerance, Decreased strength, Impaired sensation, Pain, Difficulty walking, Decreased endurance, Impaired perceived functional ability, Increased muscle spasms, Decreased range of motion  Visit Diagnosis: Chronic right-sided low back pain without sciatica  Cervicalgia  Stiffness of right hip, not elsewhere classified  Difficulty in walking, not elsewhere classified     Problem List Patient Active Problem List   Diagnosis Date Noted  . Angioedema 02/09/2017  . Primary localized osteoarthritis of right hip 01/02/2016  . Groin rash 06/16/2013  . DM w/o complication type II ([place]) 06/16/2013  . BPH (benign prostatic hyperplasia) 11/24/2012  . Healthcare maintenance 11/24/2012  . Pancreatitis 10/31/2012  . Chronic pain syndrome 09/18/2011  . 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 Right rotator cuff tear 07/26/2011  . Shoulder pain, right 05/03/2011  . Right ankle pain 01/23/2011  . Left wrist pain 01/23/2011  . Back pain 01/02/2011  . MOLLUSCUM CONTAGIOSUM 12/05/2008  . LEUKOCYTOSIS UNSPECIFIED 03/25/2008  . LIVER FUNCTION TESTS,  ABNORMAL 03/25/2008  . ERECTILE DYSFUNCTION 05/12/2007  . Essential hypertension 03/02/2007  . HYPERLIPIDEMIA 01/08/2007  . ANXIETY 01/08/2007  . GERD 01/08/2007  . RHEUMATOID ARTHRITIS 01/08/2007  . OSTEOARTHRITIS 01/08/2007    Carney Living PT DPT  05/04/2017, 9:29 PM  [facility] [address] [place], [place], [address] Phone: [phone]   Fax:  [phone]  Name: [name] MRN: [id number] Date of Birth: [birth year]

## 2017-05-05 ENCOUNTER — Encounter: Payer: Self-pay | Admitting: Physical Therapy

## 2017-05-05 ENCOUNTER — Ambulatory Visit: Payer: Medicaid Other | Admitting: Physical Therapy

## 2017-05-05 DIAGNOSIS — R262 Difficulty in walking, not elsewhere classified: Secondary | ICD-10-CM

## 2017-05-05 DIAGNOSIS — M542 Cervicalgia: Secondary | ICD-10-CM

## 2017-05-05 DIAGNOSIS — M545 Low back pain: Secondary | ICD-10-CM | POA: Diagnosis not present

## 2017-05-05 DIAGNOSIS — G8929 Other chronic pain: Secondary | ICD-10-CM

## 2017-05-05 DIAGNOSIS — M25651 Stiffness of right hip, not elsewhere classified: Secondary | ICD-10-CM

## 2017-05-05 NOTE — Therapy (Signed)
[place], [place], [address] Phone: [phone]   Fax:  [phone]  Physical Therapy Treatment  Patient Details  Name: KAZUMA ELENA MRN: [id number] Date of Birth: 03-Sep-1956 Referring Provider: Dr Edmonia Lynch   Encounter Date: 05/05/2017      PT End of Session - 05/05/17 1153    Visit Number 3   Number of Visits 16   Date for PT Re-Evaluation 06/13/17   Authorization Type medicaid but per patient will be covered by car insurance    PT Start Time 1015   PT Stop Time 1111   PT Time Calculation (min) 56 min   Activity Tolerance Patient tolerated treatment well   Behavior During Therapy [facility] for tasks assessed/performed      Past Medical History:  Diagnosis Date  . Alcohol abuse   . Allergic rhinitis   . Anxiety    With Post traumatic stress disorder  . Diabetes mellitus without complication [facility])    'sometime last yr'--type 2  . GERD (gastroesophageal reflux disease)   . Headache    "like migraines"  . Hepatitis    he thinks its hep b or c  . Hyperlipidemia   . Hypertension   . Osteoarthritis   . Pancreatitis   . Rheumatoid arthritis(714.0)   . Tobacco user     Past Surgical History:  Procedure Laterality Date  . COLONOSCOPY    . FRACTURE SURGERY     cheek bone fracture   . rtc     right shoulder  -- 2010  . 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   Procedure: TOTAL HIP ARTHROPLASTY ANTERIOR APPROACH;  Surgeon: Renette Butters, MD;  Location: [facility];  Service: Orthopedics;  Laterality: Right;  . WRIST SURGERY     fusion with pins  2007    There were no vitals filed for this visit.      Subjective Assessment - 05/05/17 1034    Subjective Patient feels like his shoulder is feeling a little better but it is still very painful. He has been doing his stretches and exercises and feelslike they are getting a little better.    Pertinent History anxiety, diabetes; chronic pain  syndrome; back pain; right ankle pain, forzen shoulder    Limitations Sitting;Standing;Walking   How long can you stand comfortably? Always hurting pain increases after 10-15 minutes    How long can you walk comfortably? Walking community distances hurts. He has to use the cart at the [employer]    Diagnostic tests X-rays: Cervical Servere C6-C7 degeneration    Currently in Pain? Yes   Pain Score 5    Pain Orientation Right;Mid   Pain Descriptors / Indicators Aching;Sharp   Pain Type Chronic pain   Pain Radiating Towards raidates into the right shoulder at times    Pain Onset More than a month ago   Pain Frequency Constant   Aggravating Factors  turning head, bending over    Pain Relieving Factors rest    Effect of Pain on Daily Activities difficulty perfroming daily activity    Pain Score 8   Pain Location Back   Pain Orientation Right;Mid   Pain Descriptors / Indicators Aching   Pain Type Chronic pain   Pain Radiating Towards pain raidating into the right buttock    Pain Onset More than a month ago   Pain Frequency Constant   Aggravating Factors  standing, walking    Pain Relieving Factors rest  Effect of Pain on Daily Activities difficulty perfroming ADL's                          [facility] Adult PT Treatment/Exercise - 05/05/17 0001      Neck Exercises: Supine   Other Supine Exercise shoulder ER x10 with wand; shoulder flexion with wand x10      Lumbar Exercises: Stretches   Lower Trunk Rotation Limitations x10    Piriformis Stretch Limitations 2x30 sec hold      Lumbar Exercises: Supine   Other Supine Lumbar Exercises supine ball squeeze 2x10;      Manual Therapy   Manual therapy comments trigger point release to bilateral upper traps; Gentle Cervical manual traction; PROM of the right shoulder; Gentle right hip stretching. Posterior mobilization of the left hip to improve motion. Soft tissue release to lumbar spine.                 PT  Education - 05/05/17 1153    Education provided Yes   Education Details updated HEP; reviewed stretches.    Person(s) Educated Patient   Methods Explanation;Demonstration;Tactile cues;Verbal cues   Comprehension Verbalized understanding;Returned demonstration;Verbal cues required;Tactile cues required          PT Short Term Goals - 05/04/17 2127      PT SHORT TERM GOAL #1   Title Patient will demsotrate a fair core contraction    Time 8   Period Weeks   Status On-going     PT SHORT TERM GOAL #2   Title Patient will increase bilateral lower extremity strength to 4/5    Time 8   Period Weeks   Status On-going     PT SHORT TERM GOAL #3   Title Patient will increase bilateral cervical rotation by 20 degrees bilateral    Time 8   Period Weeks   Status On-going           PT Long Term Goals - 04/18/17 8413      PT LONG TERM GOAL #1   Title Patient will stand for 1/2 hour without self report of pain in order to perfrom ADL's    Time 8   Period Weeks   Status New     PT LONG TERM GOAL #2   Title Patient will sit for 1/2 hour without increased pain in order to perfrom daily tasks    Time 8   Period Weeks   Status New     PT LONG TERM GOAL #3   Title Patient will demsotrate a 49% limiation on FOTO    Time 8   Period Weeks   Status New               Plan - 05/05/17 1251    Clinical Impression Statement Patients passive range of motion improved with manual therapy.    History and Personal Factors relevant to plan of care: anxiety    Clinical Presentation Evolving   Clinical Presentation due to: increasing pain with activity and pain in multpiple areas    Clinical Decision Making Moderate   Rehab Potential Good   PT Frequency 2x / week   PT Duration 8 weeks   PT Treatment/Interventions ADLs/Self Care Home Management;Cryotherapy;Electrical Stimulation;Iontophoresis 4mg /ml Dexamethasone;Stair training;Gait training;Moist Heat;Ultrasound;Traction;Therapeutic  activities;Therapeutic exercise;Patient/family education;Passive range of motion;Manual techniques;Neuromuscular re-education   PT Next Visit Plan manual therapy to cervical spine to decrease spasming; LAD to biulateral ower extremitys; consdier manual therapy to lumbar  spine; Consdier abdominal breathing and wnad; consdier ex-stim and heat but only if we cant do other things.    PT Home Exercise Plan lateral trunk rotation; single knee to chest stretching;    Consulted and Agree with Plan of Care Patient      Patient will benefit from skilled therapeutic intervention in order to improve the following deficits and impairments:  Abnormal gait, Decreased activity tolerance, Decreased strength, Impaired sensation, Pain, Difficulty walking, Decreased endurance, Impaired perceived functional ability, Increased muscle spasms, Decreased range of motion  Visit Diagnosis: Chronic right-sided low back pain without sciatica  Cervicalgia  Stiffness of right hip, not elsewhere classified  Difficulty in walking, not elsewhere classified     Problem List Patient Active Problem List   Diagnosis Date Noted  . Angioedema 02/09/2017  . Primary localized osteoarthritis of right hip 01/02/2016  . Groin rash 06/16/2013  . DM w/o complication type II ([place]) 06/16/2013  . BPH (benign prostatic hyperplasia) 11/24/2012  . Healthcare maintenance 11/24/2012  . Pancreatitis 10/31/2012  . Chronic pain syndrome 09/18/2011  . 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 Right rotator cuff tear 07/26/2011  . Shoulder pain, right 05/03/2011  . Right ankle pain 01/23/2011  . Left wrist pain 01/23/2011  . Back pain 01/02/2011  . MOLLUSCUM CONTAGIOSUM 12/05/2008  . LEUKOCYTOSIS UNSPECIFIED 03/25/2008  . LIVER FUNCTION TESTS, ABNORMAL 03/25/2008  . ERECTILE DYSFUNCTION 05/12/2007  . Essential hypertension 03/02/2007  . HYPERLIPIDEMIA 01/08/2007  . ANXIETY 01/08/2007  . GERD 01/08/2007  . RHEUMATOID ARTHRITIS 01/08/2007  .  OSTEOARTHRITIS 01/08/2007    Carney Living PT DPT  05/05/2017, 12:58 PM  [facility] [address] [place], [place], [address] Phone: [phone]   Fax:  [phone]  Name: NOCHUM FENTER MRN: [id number] Date of Birth: 11-07-1955

## 2017-05-07 ENCOUNTER — Ambulatory Visit: Payer: Medicaid Other | Admitting: Physical Therapy

## 2017-05-07 DIAGNOSIS — M545 Low back pain: Secondary | ICD-10-CM | POA: Diagnosis not present

## 2017-05-07 DIAGNOSIS — M542 Cervicalgia: Secondary | ICD-10-CM

## 2017-05-07 DIAGNOSIS — G8929 Other chronic pain: Secondary | ICD-10-CM

## 2017-05-07 DIAGNOSIS — R262 Difficulty in walking, not elsewhere classified: Secondary | ICD-10-CM

## 2017-05-07 DIAGNOSIS — M25651 Stiffness of right hip, not elsewhere classified: Secondary | ICD-10-CM

## 2017-05-07 NOTE — Therapy (Signed)
[place], [place], [address] Phone: [phone]   Fax:  [phone]  Physical Therapy Treatment  Patient Details  Name: [name] MRN: [id number] Date of Birth: 05-02-1956 Referring Provider: Dr Edmonia Lynch   Encounter Date: 05/07/2017      PT End of Session - 05/07/17 0936    Visit Number 4   Number of Visits 16   Date for PT Re-Evaluation 06/13/17   Authorization Type medicaid but per patient will be covered by car insurance    PT Start Time 0930   PT Stop Time 1015   PT Time Calculation (min) 45 min      Past Medical History:  Diagnosis Date  . Alcohol abuse   . Allergic rhinitis   . Anxiety    With Post traumatic stress disorder  . Diabetes mellitus without complication [facility])    'sometime last yr'--type 2  . GERD (gastroesophageal reflux disease)   . Headache    "like migraines"  . Hepatitis    he thinks its hep b or c  . Hyperlipidemia   . Hypertension   . Osteoarthritis   . Pancreatitis   . Rheumatoid arthritis(714.0)   . Tobacco user     Past Surgical History:  Procedure Laterality Date  . COLONOSCOPY    . FRACTURE SURGERY     cheek bone fracture   . rtc     right shoulder  -- 2010  . 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   Procedure: TOTAL HIP ARTHROPLASTY ANTERIOR APPROACH;  Surgeon: Renette Butters, MD;  Location: [place];  Service: Orthopedics;  Laterality: Right;  . WRIST SURGERY     fusion with pins  2007    There were no vitals filed for this visit.      Subjective Assessment - 05/07/17 0935    Subjective I had a rough night, in pain neck back and shoulder.    Currently in Pain? Yes   Pain Score 8    Pain Location Back  neck and shoulder                          [facility] Adult PT Treatment/Exercise - 05/07/17 0001      Neck Exercises: Seated   Neck Retraction 5 reps   Neck Retraction Limitations mac cues    Cervical Rotation  Right;Left;10 reps   Lateral Flexion 10 reps;Right;Left   Other Seated Exercise scap retraction with maximal cues for technique, shoulders down      Lumbar Exercises: Seated   Other Seated Lumbar Exercises seated upright posture training with abdominal draw ins, added clam and gentle ball squeeze      Lumbar Exercises: Supine   Ab Set 10 reps;5 seconds   Clam 10 reps   Clam Limitations with abdominal draw in   discontinued due to right shoulder pain in supine     Shoulder Exercises: Sidelying   External Rotation 15 reps;AROM     Modalities   Modalities Moist Heat  too hot      Manual Therapy   Manual therapy comments Right shoulder PROM, STW to teres, lats, lateral sub scap, scapular mobs , right upper trap soft tissue wrok                   PT Short Term Goals - 05/04/17 2127      PT SHORT TERM GOAL #1   Title Patient will demsotrate  a fair core contraction    Time 8   Period Weeks   Status On-going     PT SHORT TERM GOAL #2   Title Patient will increase bilateral lower extremity strength to 4/5    Time 8   Period Weeks   Status On-going     PT SHORT TERM GOAL #3   Title Patient will increase bilateral cervical rotation by 20 degrees bilateral    Time 8   Period Weeks   Status On-going           PT Long Term Goals - 04/18/17 3762      PT LONG TERM GOAL #1   Title Patient will stand for 1/2 hour without self report of pain in order to perfrom ADL's    Time 8   Period Weeks   Status New     PT LONG TERM GOAL #2   Title Patient will sit for 1/2 hour without increased pain in order to perfrom daily tasks    Time 8   Period Weeks   Status New     PT LONG TERM GOAL #3   Title Patient will demsotrate a 49% limiation on FOTO    Time 8   Period Weeks   Status New               Plan - 05/07/17 1032    Clinical Impression Statement Pt reports pain 8/10 and had a hard time sleeping last night. Manual to right shoulder focusing lats,  subscap, teres. Pain mostly with passive abduction and flexion. Attempted core exercises in supine however shoulder too painful in this position so moved to seated. Max cues required for proper sitting posture, attempted core contractions in seated. Began gentle neck ROM and postural exercises in seated all with max cues. Offered HMP at end of treatment, pt thought it was too hot and requested to discontinue after 2 minutes.    PT Next Visit Plan manual therapy to cervical spine to decrease spasming; LAD to biulateral ower extremitys; consdier manual therapy to lumbar spine; Consdier abdominal breathing and wnad; consdier ex-stim and heat but only if we cant do other things.    PT Home Exercise Plan lateral trunk rotation; single knee to chest stretching;    Consulted and Agree with Plan of Care Patient      Patient will benefit from skilled therapeutic intervention in order to improve the following deficits and impairments:  Abnormal gait, Decreased activity tolerance, Decreased strength, Impaired sensation, Pain, Difficulty walking, Decreased endurance, Impaired perceived functional ability, Increased muscle spasms, Decreased range of motion  Visit Diagnosis: Chronic right-sided low back pain without sciatica  Cervicalgia  Stiffness of right hip, not elsewhere classified  Difficulty in walking, not elsewhere classified     Problem List Patient Active Problem List   Diagnosis Date Noted  . Angioedema 02/09/2017  . Primary localized osteoarthritis of right hip 01/02/2016  . Groin rash 06/16/2013  . DM w/o complication type II ([place]) 06/16/2013  . BPH (benign prostatic hyperplasia) 11/24/2012  . Healthcare maintenance 11/24/2012  . Pancreatitis 10/31/2012  . Chronic pain syndrome 09/18/2011  . 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 Right rotator cuff tear 07/26/2011  . Shoulder pain, right 05/03/2011  . Right ankle pain 01/23/2011  . Left wrist pain 01/23/2011  . Back pain 01/02/2011  .  MOLLUSCUM CONTAGIOSUM 12/05/2008  . LEUKOCYTOSIS UNSPECIFIED 03/25/2008  . LIVER FUNCTION TESTS, ABNORMAL 03/25/2008  . ERECTILE DYSFUNCTION 05/12/2007  . Essential hypertension 03/02/2007  .  HYPERLIPIDEMIA 01/08/2007  . ANXIETY 01/08/2007  . GERD 01/08/2007  . RHEUMATOID ARTHRITIS 01/08/2007  . OSTEOARTHRITIS 01/08/2007    Dorene Ar, PTA 05/07/2017, 10:43 AM  [place], [place], [address] Phone: [phone]   Fax:  [phone]  Name: [name] MRN: [id number] Date of Birth: 1956/01/22

## 2017-05-12 ENCOUNTER — Ambulatory Visit: Payer: Medicaid Other | Admitting: Physical Therapy

## 2017-05-12 DIAGNOSIS — G8929 Other chronic pain: Secondary | ICD-10-CM

## 2017-05-12 DIAGNOSIS — M545 Low back pain, unspecified: Secondary | ICD-10-CM

## 2017-05-12 DIAGNOSIS — R262 Difficulty in walking, not elsewhere classified: Secondary | ICD-10-CM

## 2017-05-12 DIAGNOSIS — M542 Cervicalgia: Secondary | ICD-10-CM

## 2017-05-12 DIAGNOSIS — M25651 Stiffness of right hip, not elsewhere classified: Secondary | ICD-10-CM

## 2017-05-12 NOTE — Therapy (Signed)
[place], [place], [address] Phone: [phone]   Fax:  [phone]  Physical Therapy Treatment  Patient Details  Name: [name] MRN: [id number] Date of Birth: 01/20/1956 Referring Provider: Dr Edmonia Lynch   Encounter Date: 05/12/2017      PT End of Session - 05/12/17 1047    Visit Number 5   Number of Visits 16   Date for PT Re-Evaluation 06/13/17   Authorization Type medicaid but per patient will be covered by car insurance    PT Start Time [phone]   PT Stop Time 1017   PT Time Calculation (min) 44 min   Activity Tolerance Patient tolerated treatment well   Behavior During Therapy [facility] for tasks assessed/performed      Past Medical History:  Diagnosis Date  . Alcohol abuse   . Allergic rhinitis   . Anxiety    With Post traumatic stress disorder  . Diabetes mellitus without complication [facility])    'sometime last yr'--type 2  . GERD (gastroesophageal reflux disease)   . Headache    "like migraines"  . Hepatitis    he thinks its hep b or c  . Hyperlipidemia   . Hypertension   . Osteoarthritis   . Pancreatitis   . Rheumatoid arthritis(714.0)   . Tobacco user     Past Surgical History:  Procedure Laterality Date  . COLONOSCOPY    . FRACTURE SURGERY     cheek bone fracture   . rtc     right shoulder  -- 2010  . 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   Procedure: TOTAL HIP ARTHROPLASTY ANTERIOR APPROACH;  Surgeon: Renette Butters, MD;  Location: [place];  Service: Orthopedics;  Laterality: Right;  . WRIST SURGERY     fusion with pins  2007    There were no vitals filed for this visit.      Subjective Assessment - 05/12/17 1038    Subjective Patient feels like his shoulder has been [employer]. He continues to have back pain but that may be a little better as well.    Pertinent History anxiety, diabetes; chronic pain syndrome; back pain; right ankle pain, forzen shoulder     Limitations Sitting;Standing;Walking   How long can you stand comfortably? Always hurting pain increases after 10-15 minutes    How long can you walk comfortably? Walking community distances hurts. He has to use the cart at the [employer]    Diagnostic tests X-rays: Cervical Servere C6-C7 degeneration    Currently in Pain? Yes   Pain Score 5    Pain Location Back   Pain Orientation Right;Mid   Pain Descriptors / Indicators Aching;Sharp   Pain Type Chronic pain   Pain Radiating Towards radiates into the right shoulder    Pain Onset More than a month ago   Pain Frequency Constant   Aggravating Factors  turning and bending    Pain Relieving Factors rest    Effect of Pain on Daily Activities difficulty perfroming daily tasks    Multiple Pain Sites Yes   Pain Score 6   Pain Location Back   Pain Orientation Right;Mid   Pain Descriptors / Indicators Aching   Pain Type Chronic pain   Pain Radiating Towards pain radiating into the left buttock    Pain Onset More than a month ago   Pain Frequency Constant   Aggravating Factors  standing and walking    Pain Relieving Factors rest  Effect of Pain on Daily Activities difficulty perfroming ADL's                          [facility] Adult PT Treatment/Exercise - 05/12/17 0001      Lumbar Exercises: Supine   Clam 10 reps   Clam Limitations with abdominal draw in   discontinued due to right shoulder pain in supine   Other Supine Lumbar Exercises supine ball squeeze 2x10;      Shoulder Exercises: Supine   Other Supine Exercises bilateral ER in low range 2x10 yellow      Shoulder Exercises: Standing   Other Standing Exercises shoulder extension yellow x10; scpaular retraction 2x10 yellow;      Modalities   Modalities --  Patient was put on heat then declined after he was set up.     Manual Therapy   Manual therapy comments Right shoulder PROM, STW to teres, lats, lateral sub scap, scapular mobs , right upper trap soft  tissue work; IASTYM to bilateral upper trap; Posterior hip mobilization; Passive hip flexion; bilateral;                 PT Education - 05/12/17 1046    Education provided Yes   Education Details continue with HEP, reviewed stretches; updated HEP    Person(s) Educated Patient   Methods Explanation;Demonstration;Tactile cues;Verbal cues   Comprehension Verbalized understanding;Returned demonstration;Verbal cues required          PT Short Term Goals - 05/04/17 2127      PT SHORT TERM GOAL #1   Title Patient will demsotrate a fair core contraction    Time 8   Period Weeks   Status On-going     PT SHORT TERM GOAL #2   Title Patient will increase bilateral lower extremity strength to 4/5    Time 8   Period Weeks   Status On-going     PT SHORT TERM GOAL #3   Title Patient will increase bilateral cervical rotation by 20 degrees bilateral    Time 8   Period Weeks   Status On-going           PT Long Term Goals - 04/18/17 2585      PT LONG TERM GOAL #1   Title Patient will stand for 1/2 hour without self report of pain in order to perfrom ADL's    Time 8   Period Weeks   Status New     PT LONG TERM GOAL #2   Title Patient will sit for 1/2 hour without increased pain in order to perfrom daily tasks    Time 8   Period Weeks   Status New     PT LONG TERM GOAL #3   Title Patient will demsotrate a 49% limiation on FOTO    Time 8   Period Weeks   Status New               Plan - 05/12/17 1047    Clinical Impression Statement Per visual inspection significant improveent noted in his right shoulder. He continues to have spasming in the cervical spin eand paraspinals but the spasming has improved. His hip flexion is improving as well. Therapy gave the patient light RTC strengthening and postrual stability strengthening. Therapy will continue to progress him as tolerated.    History and Personal Factors relevant to plan of care: anxiety   Clinical  Presentation Evolving   Clinical Presentation due to: increasing pain  and activity    Clinical Decision Making Moderate   Rehab Potential Good   PT Frequency 2x / week   PT Duration 8 weeks   PT Treatment/Interventions ADLs/Self Care Home Management;Cryotherapy;Electrical Stimulation;Iontophoresis 4mg /ml Dexamethasone;Stair training;Gait training;Moist Heat;Ultrasound;Traction;Therapeutic activities;Therapeutic exercise;Patient/family education;Passive range of motion;Manual techniques;Neuromuscular re-education   PT Next Visit Plan manual therapy to cervical spine to decrease spasming; LAD to biulateral ower extremitys; consdier manual therapy to lumbar spine; Consdier abdominal breathing and wnad; consdier ex-stim and heat but only if we cant do other things.    PT Home Exercise Plan lateral trunk rotation; single knee to chest stretching;    Consulted and Agree with Plan of Care Patient      Patient will benefit from skilled therapeutic intervention in order to improve the following deficits and impairments:  Abnormal gait, Decreased activity tolerance, Decreased strength, Impaired sensation, Pain, Difficulty walking, Decreased endurance, Impaired perceived functional ability, Increased muscle spasms, Decreased range of motion  Visit Diagnosis: Chronic right-sided low back pain without sciatica  Cervicalgia  Stiffness of right hip, not elsewhere classified  Difficulty in walking, not elsewhere classified     Problem List Patient Active Problem List   Diagnosis Date Noted  . Angioedema 02/09/2017  . Primary localized osteoarthritis of right hip 01/02/2016  . Groin rash 06/16/2013  . DM w/o complication type II ([place]) 06/16/2013  . BPH (benign prostatic hyperplasia) 11/24/2012  . Healthcare maintenance 11/24/2012  . Pancreatitis 10/31/2012  . Chronic pain syndrome 09/18/2011  . 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 Right rotator cuff tear 07/26/2011  . Shoulder pain, right 05/03/2011   . Right ankle pain 01/23/2011  . Left wrist pain 01/23/2011  . Back pain 01/02/2011  . MOLLUSCUM CONTAGIOSUM 12/05/2008  . LEUKOCYTOSIS UNSPECIFIED 03/25/2008  . LIVER FUNCTION TESTS, ABNORMAL 03/25/2008  . ERECTILE DYSFUNCTION 05/12/2007  . Essential hypertension 03/02/2007  . HYPERLIPIDEMIA 01/08/2007  . ANXIETY 01/08/2007  . GERD 01/08/2007  . RHEUMATOID ARTHRITIS 01/08/2007  . OSTEOARTHRITIS 01/08/2007    Carney Living PT DPT  05/12/2017, 10:53 AM  [facility] [address] [place], [place], [address] Phone: [phone]   Fax:  [phone]  Name: [name] MRN: [id number] Date of Birth: 1956-02-17

## 2017-05-14 ENCOUNTER — Ambulatory Visit: Payer: Medicaid Other | Admitting: Family Medicine

## 2017-05-15 ENCOUNTER — Ambulatory Visit: Payer: Medicaid Other | Admitting: Physical Therapy

## 2017-05-15 DIAGNOSIS — M25651 Stiffness of right hip, not elsewhere classified: Secondary | ICD-10-CM

## 2017-05-15 DIAGNOSIS — M545 Low back pain, unspecified: Secondary | ICD-10-CM

## 2017-05-15 DIAGNOSIS — G8929 Other chronic pain: Secondary | ICD-10-CM

## 2017-05-15 DIAGNOSIS — M542 Cervicalgia: Secondary | ICD-10-CM

## 2017-05-15 DIAGNOSIS — R262 Difficulty in walking, not elsewhere classified: Secondary | ICD-10-CM

## 2017-05-15 NOTE — Therapy (Addendum)
[place], [place], [address] Phone: [phone]   Fax:  [phone]  Physical Therapy Treatment/ Discharge   Patient Details  Name: [name] MRN: [id number] Date of Birth: 06-14-1956 Referring Provider: Dr Edmonia [name]   Encounter Date: 05/15/2017      PT End of Session - 05/15/17 0940    Visit Number 6   Number of Visits 16   Date for PT Re-Evaluation 06/13/17   Authorization Type medicaid but per patient will be covered by car insurance    PT Start Time 0930   PT Stop Time 1015   PT Time Calculation (min) 45 min      Past Medical History:  Diagnosis Date  . Alcohol abuse   . Allergic rhinitis   . Anxiety    With Post traumatic stress disorder  . Diabetes mellitus without complication [facility])    'sometime last yr'--type 2  . GERD (gastroesophageal reflux disease)   . Headache    "like migraines"  . Hepatitis    he thinks its hep b or c  . Hyperlipidemia   . Hypertension   . Osteoarthritis   . Pancreatitis   . Rheumatoid arthritis(714.0)   . Tobacco user     Past Surgical History:  Procedure Laterality Date  . COLONOSCOPY    . FRACTURE SURGERY     cheek bone fracture   . rtc     right shoulder  -- 2010  . 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   Procedure: TOTAL HIP ARTHROPLASTY ANTERIOR APPROACH;  Surgeon: Brian Butters, MD;  Location: [place];  Service: Orthopedics;  Laterality: Right;  . WRIST SURGERY     fusion with pins  2007    There were no vitals filed for this visit.      Subjective Assessment - 05/15/17 0939    Subjective Its not a good day.    Currently in Pain? Yes   Pain Score 8    Pain Location Back   Pain Orientation Right;Mid   Pain Descriptors / Indicators Aching;Sharp   Pain Score 8   Pain Location Shoulder  and back of neck, shoudler blade    Pain Orientation Right            [facility] PT Assessment - 05/15/17 0001      AROM   Cervical -  Right Rotation 30   Cervical - Left Rotation 30                     [facility] Adult PT Treatment/Exercise - 05/15/17 0001      Lumbar Exercises: Aerobic   Stationary Bike Nustep L1 UE/LE shiort ROM 7 minutes     Lumbar Exercises: Supine   Ab Set 10 reps;5 seconds   Clam 10 reps   Clam Limitations with abdominal draw in     Shoulder Exercises: Supine   Other Supine Exercises scapular retraction into table x 10      Shoulder Exercises: Seated   Other Seated Exercises UE ranger flexion > 90 degrees, ER/IR, scaption as tolerated      Shoulder Exercises: Pulleys   Flexion 2 minutes     Manual Therapy   Manual Therapy Soft tissue mobilization   Soft tissue mobilization Upper trap, levator, middle trap, rhomboids all in seated                  PT Short Term Goals - 05/04/17 2127  PT SHORT TERM GOAL #1   Title Patient will demsotrate a fair core contraction    Time 8   Period Weeks   Status On-going     PT SHORT TERM GOAL #2   Title Patient will increase bilateral lower extremity strength to 4/5    Time 8   Period Weeks   Status On-going     PT SHORT TERM GOAL #3   Title Patient will increase bilateral cervical rotation by 20 degrees bilateral    Time 8   Period Weeks   Status On-going           PT Long Term Goals - 04/18/17 7408      PT LONG TERM GOAL #1   Title Patient will stand for 1/2 hour without self report of pain in order to perfrom ADL's    Time 8   Period Weeks   Status New     PT LONG TERM GOAL #2   Title Patient will sit for 1/2 hour without increased pain in order to perfrom daily tasks    Time 8   Period Weeks   Status New     PT LONG TERM GOAL #3   Title Patient will demsotrate a 49% limiation on FOTO    Time 8   Period Weeks   Status New               Plan - 05/15/17 1207    Clinical Impression Statement [name] reports increased pain over last two days. He had diarrhea yesterday which may be a  contributing factor. Spent most time on manual to right lumbar paraspinals/QL and right upper trap and persicapular muscles. Pain decreased to 6/10 after treatment.    PT Next Visit Plan manual therapy to cervical spine to decrease spasming; LAD to biulateral ower extremitys; consdier manual therapy to lumbar spine; Consdier abdominal breathing and wnad; consdier ex-stim and heat but only if we cant do other things.    PT Home Exercise Plan lateral trunk rotation; single knee to chest stretching;    Consulted and Agree with Plan of Care Patient      Patient will benefit from skilled therapeutic intervention in order to improve the following deficits and impairments:  Abnormal gait, Decreased activity tolerance, Decreased strength, Impaired sensation, Pain, Difficulty walking, Decreased endurance, Impaired perceived functional ability, Increased muscle spasms, Decreased range of motion  Visit Diagnosis: Chronic right-sided low back pain without sciatica  Cervicalgia  Stiffness of right hip, not elsewhere classified  Difficulty in walking, not elsewhere classified  PHYSICAL THERAPY DISCHARGE SUMMARY  Visits from Start of Care: 6  Current functional level related to goals / functional outcomes: Patient going to [place] to be re-assessed    Remaining deficits: Continued pain in neck and back    Education / Equipment: Has HEP   Plan: Patient agrees to discharge.  Patient goals were not met. Patient is being discharged due to meeting the stated rehab goals.  ?????       Problem List Patient Active Problem List   Diagnosis Date Noted  . Angioedema 02/09/2017  . Primary localized osteoarthritis of right hip 01/02/2016  . Groin rash 06/16/2013  . DM w/o complication type II ([place]) 06/16/2013  . BPH (benign prostatic hyperplasia) 11/24/2012  . Healthcare maintenance 11/24/2012  . Pancreatitis 10/31/2012  . Chronic pain syndrome 09/18/2011  . 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 Right  rotator cuff tear 07/26/2011  . Shoulder pain, right 05/03/2011  . Right  ankle pain 01/23/2011  . Left wrist pain 01/23/2011  . Back pain 01/02/2011  . MOLLUSCUM CONTAGIOSUM 12/05/2008  . LEUKOCYTOSIS UNSPECIFIED 03/25/2008  . LIVER FUNCTION TESTS, ABNORMAL 03/25/2008  . ERECTILE DYSFUNCTION 05/12/2007  . Essential hypertension 03/02/2007  . HYPERLIPIDEMIA 01/08/2007  . ANXIETY 01/08/2007  . GERD 01/08/2007  . RHEUMATOID ARTHRITIS 01/08/2007  . OSTEOARTHRITIS 01/08/2007    Dorene Ar, PTA 05/15/2017, 12:09 PM Carolyne Littles PT DPT  07/07/2017 1:18 PM  [facility] [address] [place], [place], [address] Phone: [phone]   Fax:  [phone]  Name: [name] MRN: [id number] Date of Birth: [birth year]

## 2017-05-19 ENCOUNTER — Encounter: Payer: Medicaid Other | Admitting: Physical Therapy

## 2017-05-22 ENCOUNTER — Ambulatory Visit: Payer: Medicaid Other | Admitting: Physical Therapy

## 2017-05-23 ENCOUNTER — Ambulatory Visit: Payer: Medicaid Other | Admitting: Physical Therapy

## 2017-05-27 ENCOUNTER — Ambulatory Visit: Payer: Medicaid Other | Admitting: Physical Therapy

## 2017-11-18 ENCOUNTER — Emergency Department (HOSPITAL_COMMUNITY): Payer: Medicaid Other

## 2017-11-18 ENCOUNTER — Other Ambulatory Visit: Payer: Self-pay

## 2017-11-18 ENCOUNTER — Emergency Department (HOSPITAL_COMMUNITY)
Admission: EM | Admit: 2017-11-18 | Discharge: 2017-11-18 | Disposition: A | Discharge status: Home / Self Care | Payer: Medicaid Other | Attending: Emergency Medicine | Admitting: Emergency Medicine

## 2017-11-18 ENCOUNTER — Encounter (HOSPITAL_COMMUNITY): Payer: Self-pay

## 2017-11-18 DIAGNOSIS — F17210 Nicotine dependence, cigarettes, uncomplicated: Secondary | ICD-10-CM | POA: Insufficient documentation

## 2017-11-18 DIAGNOSIS — I10 Essential (primary) hypertension: Secondary | ICD-10-CM | POA: Insufficient documentation

## 2017-11-18 DIAGNOSIS — Z7984 Long term (current) use of oral hypoglycemic drugs: Secondary | ICD-10-CM | POA: Diagnosis not present

## 2017-11-18 DIAGNOSIS — R0602 Shortness of breath: Secondary | ICD-10-CM

## 2017-11-18 DIAGNOSIS — J181 Lobar pneumonia, unspecified organism: Secondary | ICD-10-CM | POA: Diagnosis not present

## 2017-11-18 DIAGNOSIS — R112 Nausea with vomiting, unspecified: Secondary | ICD-10-CM | POA: Insufficient documentation

## 2017-11-18 DIAGNOSIS — R05 Cough: Secondary | ICD-10-CM | POA: Diagnosis present

## 2017-11-18 DIAGNOSIS — R059 Cough, unspecified: Secondary | ICD-10-CM

## 2017-11-18 DIAGNOSIS — E876 Hypokalemia: Secondary | ICD-10-CM | POA: Insufficient documentation

## 2017-11-18 DIAGNOSIS — E119 Type 2 diabetes mellitus without complications: Secondary | ICD-10-CM | POA: Insufficient documentation

## 2017-11-18 DIAGNOSIS — J189 Pneumonia, unspecified organism: Secondary | ICD-10-CM

## 2017-11-18 DIAGNOSIS — Z96641 Presence of right artificial hip joint: Secondary | ICD-10-CM | POA: Diagnosis not present

## 2017-11-18 DIAGNOSIS — D72829 Elevated white blood cell count, unspecified: Secondary | ICD-10-CM

## 2017-11-18 DIAGNOSIS — J209 Acute bronchitis, unspecified: Secondary | ICD-10-CM | POA: Diagnosis not present

## 2017-11-18 DIAGNOSIS — Z79899 Other long term (current) drug therapy: Secondary | ICD-10-CM | POA: Diagnosis not present

## 2017-11-18 DIAGNOSIS — Z720 Tobacco use: Secondary | ICD-10-CM

## 2017-11-18 LAB — CBC WITH DIFFERENTIAL/PLATELET
BASOS ABS: 0 10*3/uL (ref 0.0–0.1)
BASOS PCT: 0 %
Eosinophils Absolute: 0.2 10*3/uL (ref 0.0–0.7)
Eosinophils Relative: 2 %
HEMATOCRIT: 45.2 % (ref 39.0–52.0)
HEMOGLOBIN: 15.9 g/dL (ref 13.0–17.0)
LYMPHS PCT: 30 %
Lymphs Abs: 3.2 10*3/uL (ref 0.7–4.0)
MCH: 33.1 pg (ref 26.0–34.0)
MCHC: 35.2 g/dL (ref 30.0–36.0)
MCV: 94 fL (ref 78.0–100.0)
Monocytes Absolute: 0.8 10*3/uL (ref 0.1–1.0)
Monocytes Relative: 7 %
NEUTROS ABS: 6.6 10*3/uL (ref 1.7–7.7)
NEUTROS PCT: 61 %
Platelets: 185 10*3/uL (ref 150–400)
RBC: 4.81 MIL/uL (ref 4.22–5.81)
RDW: 12.9 % (ref 11.5–15.5)
WBC: 10.7 10*3/uL — ABNORMAL HIGH (ref 4.0–10.5)

## 2017-11-18 LAB — BASIC METABOLIC PANEL
ANION GAP: 12 (ref 5–15)
BUN: <5 mg/dL — ABNORMAL LOW (ref 6–20)
CHLORIDE: 100 mmol/L — AB (ref 101–111)
CO2: 24 mmol/L (ref 22–32)
Calcium: 9.5 mg/dL (ref 8.9–10.3)
Creatinine, Ser: 0.84 mg/dL (ref 0.61–1.24)
GFR calc non Af Amer: >60 mL/min (ref >60)
GLUCOSE: 126 mg/dL — AB (ref 65–99)
Potassium: 3.4 mmol/L — ABNORMAL LOW (ref 3.5–5.1)
Sodium: 136 mmol/L (ref 135–145)

## 2017-11-18 LAB — LIPASE, BLOOD: Lipase: 26 U/L (ref 11–51)

## 2017-11-18 MED ORDER — IPRATROPIUM BROMIDE 0.02 % IN SOLN
0.5000 mg | Freq: Once | RESPIRATORY_TRACT | Status: AC
  Administered 2017-11-18: 0.5 mg via RESPIRATORY_TRACT
  Filled 2017-11-18: qty 2.5

## 2017-11-18 MED ORDER — ALBUTEROL SULFATE (2.5 MG/3ML) 0.083% IN NEBU
5.0000 mg | INHALATION_SOLUTION | Freq: Once | RESPIRATORY_TRACT | Status: AC
  Administered 2017-11-18: 5 mg via RESPIRATORY_TRACT
  Filled 2017-11-18: qty 6

## 2017-11-18 MED ORDER — HYDROCODONE-ACETAMINOPHEN 5-325 MG PO TABS
1.0000 | ORAL_TABLET | Freq: Once | ORAL | Status: AC
  Administered 2017-11-18: 1 via ORAL
  Filled 2017-11-18: qty 1

## 2017-11-18 MED ORDER — ALBUTEROL SULFATE HFA 108 (90 BASE) MCG/ACT IN AERS
1.0000 | INHALATION_SPRAY | Freq: Once | RESPIRATORY_TRACT | Status: AC
  Administered 2017-11-18: 2 via RESPIRATORY_TRACT
  Filled 2017-11-18: qty 6.7

## 2017-11-18 MED ORDER — LEVOFLOXACIN 750 MG PO TABS: 750.0000 mg | ORAL_TABLET | Freq: Every day | ORAL | 0 refills | Status: AC

## 2017-11-18 MED ORDER — ALBUTEROL SULFATE HFA 108 (90 BASE) MCG/ACT IN AERS: 2.0000 | INHALATION_SPRAY | Freq: Once | RESPIRATORY_TRACT | Status: DC

## 2017-11-18 MED ORDER — ALBUTEROL SULFATE HFA 108 (90 BASE) MCG/ACT IN AERS: 1.0000 | INHALATION_SPRAY | RESPIRATORY_TRACT | 0 refills | Status: DC | PRN

## 2017-11-18 MED ORDER — ONDANSETRON 4 MG PO TBDP: 4.0000 mg | ORAL_TABLET | Freq: Three times a day (TID) | ORAL | 0 refills | Status: DC | PRN

## 2017-11-18 MED ORDER — LEVOFLOXACIN 750 MG PO TABS
750.0000 mg | ORAL_TABLET | Freq: Once | ORAL | Status: AC
Start: 2017-11-18 — End: 2017-11-18
  Administered 2017-11-18: 750 mg via ORAL
  Filled 2017-11-18: qty 1

## 2017-11-18 MED ORDER — ONDANSETRON 4 MG PO TBDP
8.0000 mg | ORAL_TABLET | Freq: Once | ORAL | Status: AC
  Administered 2017-11-18: 8 mg via ORAL
  Filled 2017-11-18: qty 2

## 2017-11-18 MED ORDER — ACETAMINOPHEN 325 MG PO TABS
650.0000 mg | ORAL_TABLET | Freq: Once | ORAL | Status: DC
  Filled 2017-11-18: qty 2

## 2017-11-18 NOTE — ED Triage Notes (Addendum)
Pt presents with 3 day h/o productive cough with green phlegm; +nasal congestion and shortness of breath; +chills  Pt also reports nausea and vomiting with mid-abdominal pain x 3 days as well.

## 2017-11-18 NOTE — ED Notes (Signed)
Pt ambulated in hallway on pulse ox with a saturation of 98%, pt denied feeling short of breath.

## 2017-11-18 NOTE — ED Notes (Signed)
Pt was unable to ambulate down the hall due to pt desat on room air while standing at bedside

## 2017-11-18 NOTE — ED Provider Notes (Signed)
[place] EMERGENCY DEPARTMENT Provider Note   CSN: [id number] Arrival date & time: 11/18/17  1641     History   Chief Complaint Chief Complaint  Patient presents with  . Cough    HPI [name] is a 62 y.o. male with a PMHx of alcoholism, tobacco user, DM2, GERD, headaches, Hepatitis, HTN, HLD, chronic pain syndrome, prior pancreatitis, and other conditions listed below, who presents to the ED with complaints of cough and URI symptoms x 3 days.  Patient states that recently he has had multiple family members who were sick with similar symptoms, and thinks he could have potentially come into contact with one, or that he went out in the cold too soon after a shower and got sick because of that.  He states that he is having a cough with green and yellow sputum production, shortness of breath, wheezing, rib and shoulder pain with coughing, nasal congestion, and chills.  He is also had 3 episodes of nonbloody nonbilious posttussive emesis today with associated nausea.  He has been using Tylenol and his home Vicodin with some relief of his symptoms, but he has not taken anything over-the-counter for his cough and cold symptoms.  Activity seems to aggravate his symptoms.  He denies having a known history of asthma or COPD, but he admits to being a cigarette smoker.  He has not recently been hospitalized or in a nursing home, nor has he come into contact with anybody who has been in the hospital or nursing home.  His PCP is at the [place].  He denies any known fevers, sore throat, ear pain or drainage, hemoptysis, chest pain, leg swelling, recent travel/surgery/immobilization, personal or family history of DVT/PE, abdominal pain, hematemesis, diarrhea, constipation, melena, hematochezia, dysuria, hematuria, arthralgias, numbness, tingling, focal weakness, or any other complaints at this time.   The history is provided by the patient and medical records. No language interpreter was  used.  Cough  This is a new problem. The current episode started more than 2 days ago. The problem occurs constantly. The problem has not changed since onset.The cough is productive of sputum. There has been no fever. Associated symptoms include chills, myalgias (rib pain with coughing), shortness of breath and wheezing. Pertinent negatives include no chest pain, no ear pain and no sore throat. Treatments tried: tylenol and vicodin. The treatment provided mild relief. He is a smoker. His past medical history does not include COPD or asthma.    Past Medical History:  Diagnosis Date  . Alcohol abuse   . Allergic rhinitis   . Anxiety    With Post traumatic stress disorder  . Diabetes mellitus without complication [facility])    'sometime last yr'--type 2  . GERD (gastroesophageal reflux disease)   . Headache    "like migraines"  . Hepatitis    he thinks its hep b or c  . Hyperlipidemia   . Hypertension   . Osteoarthritis   . Pancreatitis   . Rheumatoid arthritis(714.0)   . Tobacco user     Patient Active Problem List   Diagnosis Date Noted  . Angioedema 02/09/2017  . Primary localized osteoarthritis of right hip 01/02/2016  . Groin rash 06/16/2013  . DM w/o complication type II ([place]) 06/16/2013  . BPH (benign prostatic hyperplasia) 11/24/2012  . Healthcare maintenance 11/24/2012  . Pancreatitis 10/31/2012  . Chronic pain syndrome 09/18/2011  . 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 Right rotator cuff tear 07/26/2011  . Shoulder pain, right 05/03/2011  .  Right ankle pain 01/23/2011  . Left wrist pain 01/23/2011  . Back pain 01/02/2011  . MOLLUSCUM CONTAGIOSUM 12/05/2008  . LEUKOCYTOSIS UNSPECIFIED 03/25/2008  . LIVER FUNCTION TESTS, ABNORMAL 03/25/2008  . ERECTILE DYSFUNCTION 05/12/2007  . Essential hypertension 03/02/2007  . HYPERLIPIDEMIA 01/08/2007  . ANXIETY 01/08/2007  . GERD 01/08/2007  . RHEUMATOID ARTHRITIS 01/08/2007  . OSTEOARTHRITIS 01/08/2007    Past Surgical History:    Procedure Laterality Date  . COLONOSCOPY    . FRACTURE SURGERY     cheek bone fracture   . rtc     right shoulder  -- 2010  . 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   Procedure: TOTAL HIP ARTHROPLASTY ANTERIOR APPROACH;  Surgeon: Renette Butters, MD;  Location: [facility];  Service: Orthopedics;  Laterality: Right;  . WRIST SURGERY     fusion with pins  2007       Home Medications    Prior to Admission medications   Medication Sig Start Date End Date Taking? Authorizing Provider  amLODipine (NORVASC) 5 MG tablet Take 1 tablet (5 mg total) by mouth daily. 02/12/17   Dorena Dew, FNP  EPINEPHrine 0.3 mg/0.3 mL IJ SOAJ injection Inject 0.3 mLs (0.3 mg total) into the muscle once as needed (For anyphylaxis or severe angioedema.). 02/10/17   Zada Finders, MD  gabapentin (NEURONTIN) 300 MG capsule Take 1 capsule (300 mg total) by mouth at bedtime. 02/12/17   Dorena Dew, FNP  gemfibrozil (LOPID) 600 MG tablet Take 1 tablet (600 mg total) by mouth 2 (two) times daily before a meal. 02/12/17   Dorena Dew, FNP  hydrochlorothiazide (MICROZIDE) 12.5 MG capsule Take 1 capsule (12.5 mg total) by mouth daily. 02/12/17   Dorena Dew, FNP  HYDROcodone-acetaminophen (NORCO) 10-325 MG tablet Take 1 tablet by mouth 4 (four) times daily as needed for moderate pain.     [provider]  metFORMIN (GLUCOPHAGE) 500 MG tablet Take 1 tablet (500 mg total) by mouth daily with breakfast. 02/12/17   Dorena Dew, FNP  omega-3 acid ethyl esters (LOVAZA) 1 g capsule Take 1 capsule (1 g total) by mouth daily. 02/12/17   Dorena Dew, FNP    Family History Family History  Problem Relation Age of Onset  . Kidney disease Brother   . Angioedema Maternal Aunt        Tongue swelling    Social History Social History   Tobacco Use  . Smoking status: Current Every Day Smoker    Packs/day: 0.40    Years: 25.00    Pack years: 10.00    Types: Cigarettes  . Smokeless tobacco:  Never Used  . Tobacco comment: trying  Substance Use Topics  . Alcohol use: Yes    Alcohol/week: 0.0 oz    Types: 7 - 14 Cans of beer per week    Comment: 1-2 beers occasionally  . Drug use: No     Allergies   Lisinopril and Nsaids   Review of Systems Review of Systems  Constitutional: Positive for chills. Negative for fever.  HENT: Positive for congestion. Negative for ear discharge, ear pain and sore throat.   Respiratory: Positive for cough, shortness of breath and wheezing.   Cardiovascular: Negative for chest pain and leg swelling.  Gastrointestinal: Positive for nausea and vomiting. Negative for abdominal pain, blood in stool, constipation and diarrhea.  Genitourinary: Negative for dysuria and hematuria.  Musculoskeletal: Positive for myalgias (rib pain with coughing). Negative for arthralgias.  Skin: Negative  for color change.  Allergic/Immunologic: Positive for immunocompromised state (DM2).  Neurological: Negative for weakness and numbness.  Psychiatric/Behavioral: Negative for confusion.   All other systems reviewed and are negative for acute change except as noted in the HPI.    Physical Exam Updated Vital Signs BP (!) 178/85 (BP Location: Right Arm)   Pulse (!) 55   Temp 98.1 F (36.7 C) (Oral)   Resp 18   Ht 5\' 7"  (1.702 m)   Wt 68 kg (150 lb)   SpO2 95%   BMI 23.49 kg/m   Physical Exam  Constitutional: He is oriented to person, place, and time. Vital signs are normal. He appears well-developed and well-nourished.  Non-toxic appearance. No distress.  Afebrile, nontoxic, NAD  HENT:  Head: Normocephalic and atraumatic.  Nose: Mucosal edema present.  Mouth/Throat: Oropharynx is clear and moist and mucous membranes are normal.  Mild nasal congestion  Eyes: Conjunctivae and EOM are normal. Right eye exhibits no discharge. Left eye exhibits no discharge.  Neck: Normal range of motion. Neck supple.  Cardiovascular: Normal rate, regular rhythm, normal heart  sounds and intact distal pulses. Exam reveals no gallop and no friction rub.  No murmur heard. RRR, nl s1/s2, no m/r/g, distal pulses intact, no pedal edema   Pulmonary/Chest: Effort normal. No respiratory distress. He has decreased breath sounds in the right lower field. He has wheezes. He has rhonchi. He has no rales. He exhibits no tenderness, no crepitus, no deformity and no retraction.  Scattered rhonchi throughout and faint expiratory wheezing throughout, slightly diminished lung sounds in RLL, no rales, no hypoxia or increased WOB, speaking in full sentences, SpO2 95% on RA Chest wall nonTTP without crepitus, deformities, or retractions   Abdominal: Soft. Normal appearance and bowel sounds are normal. He exhibits no distension. There is no tenderness. There is no rigidity, no rebound, no guarding, no CVA tenderness, no tenderness at McBurney's point and negative Murphy's sign.  Soft, NTND, +BS throughout, no r/g/r, neg murphy's, neg mcburney's, no CVA TTP   Musculoskeletal: Normal range of motion.  MAE x4 Strength and sensation grossly intact in all extremities Distal pulses intact Gait steady No pedal edema, neg homan's bilaterally   Neurological: He is alert and oriented to person, place, and time. He has normal strength. No sensory deficit.  Skin: Skin is warm, dry and intact. No rash noted.  Psychiatric: He has a normal mood and affect.  Nursing note and vitals reviewed.    ED Treatments / Results  Labs (all labs ordered are listed, but only abnormal results are displayed) Labs Reviewed  CBC WITH DIFFERENTIAL/PLATELET - Abnormal; Notable for the following components:      Result Value   WBC 10.7 (*)    All other components within normal limits  BASIC METABOLIC PANEL - Abnormal; Notable for the following components:   Potassium 3.4 (*)    Chloride 100 (*)    Glucose, Bld 126 (*)    BUN <5 (*)    All other components within normal limits  LIPASE, BLOOD    EKG  EKG  Interpretation None       Radiology Dg Chest 2 View  Result Date: 11/18/2017 CLINICAL DATA:  Cough x3 days with wheezing. EXAM: CHEST  2 VIEW COMPARISON:  01/07/2017 FINDINGS: The heart size and mediastinal contours are within normal limits. Mild interstitial prominence is again noted likely representing mild bronchitic change. Small right lower lobe opacity likely reflects overlap of adjacent ribs and pulmonary vessels. Pneumonia  is believed less likely. Visualized skeletal structures are unremarkable. IMPRESSION: Mild interstitial prominence likely related to bronchitic change. Electronically Signed   By: Ashley Royalty M.D.   On: 11/18/2017 18:07    Procedures Procedures (including critical care time)  Medications Ordered in ED Medications  albuterol (PROVENTIL) (2.5 MG/3ML) 0.083% nebulizer solution 5 mg (not administered)  ipratropium (ATROVENT) nebulizer solution 0.5 mg (not administered)  albuterol (PROVENTIL) (2.5 MG/3ML) 0.083% nebulizer solution 5 mg (5 mg Nebulization Given 11/18/17 1915)  ipratropium (ATROVENT) nebulizer solution 0.5 mg (0.5 mg Nebulization Given 11/18/17 1915)  ondansetron (ZOFRAN-ODT) disintegrating tablet 8 mg (8 mg Oral Given 11/18/17 1913)  levofloxacin (LEVAQUIN) tablet 750 mg (750 mg Oral Given 11/18/17 1919)  HYDROcodone-acetaminophen (NORCO/VICODIN) 5-325 MG per tablet 1 tablet (1 tablet Oral Given 11/18/17 1919)     Initial Impression / Assessment and Plan / ED Course  I have reviewed the triage vital signs and the nursing notes.  Pertinent labs & imaging results that were available during my care of the patient were reviewed by me and considered in my medical decision making (see chart for details).     62 y.o. male here with cough and congestion x3 days as well as wheezing, SOB, rib pain with coughing, chills, and posttussive emesis. +Sick contacts. On exam, no abdominal tenderness, lungs with scattered rhonchi and diffuse wheezing throughout and  slightly diminished lung sounds in RLL, no hypoxia or increased WOB, no tachycardia or LE swelling. Work up thus far reveals: CBC w/diff with mildly elevated WBC 10.7; BMP with marginally low K 3.4 but doubt need for repletion and remainder of BMP essentially WNL; Lipase WNL; CXR with mild bronchitic findings and a small RLL opacity which could reflect overlap of ribs with pulmonary vessels but could also be due to PNA. Given symptoms, PNA is likely a cause. Will give duoneb, zofran, pain meds, and levaquin then reassess. Doubt need for further emergent work up at this time. Will hold off on prednisone since pt is a diabetic and want to avoid this; will reassess after duoneb.   7:52 PM Pt's lung sounds greatly improved and pt feeling better after duoneb, no ongoing wheezing, slight rhonchi but this is mostly cleared with coughing. Tolerating PO well. Will attempt ambulation, if no desats then will d/c home. Will reassess after that.   8:10 PM Pt desats to 86% just standing in the room; will repeat duoneb and retry ambulation trial. Patient care to be resumed by Charlann Lange, PA-C at shift change sign-out. Patient history has been discussed with midlevel resuming care. Plan is to reassess after duoneb, if improved and able to ambulate without desats then send home with levaquin to treat for presumed PNA, inhaler from here and refill rx for one as well, rx zofran, and discussed OTC remedies for symptomatic relief, pt is a cigarette smoker, tobacco cessation strongly encouraged, and Advised f/up with PCP in 1wk for recheck of symptoms. If still desatting, then consider more duonebs and/or admission. Rx's left with Charlann Lange PA-C in the event he improves and can be discharged. Please see [facility] notes for further documentation of pending results and dispo/care. Pt stable at sign-out and updated on transfer of care.    Final Clinical Impressions(s) / ED Diagnoses   Final diagnoses:  Community  acquired pneumonia of right lower lobe of lung ([place])  Acute bronchitis, unspecified organism  SOB (shortness of breath)  Cough  Leukocytosis, unspecified type  Hypokalemia  Tobacco user  Nausea and  vomiting in adult patient    ED Discharge Orders        Ordered    levofloxacin (LEVAQUIN) 750 MG tablet  Daily     11/18/17 1945    albuterol (PROVENTIL HFA;VENTOLIN HFA) 108 (90 Base) MCG/ACT inhaler  Every 4 hours PRN     11/18/17 1945    ondansetron (ZOFRAN ODT) 4 MG disintegrating tablet  Every 8 hours PRN     11/18/17 [address], [place], Hershal Coria 11/18/17 2012    Milton Ferguson, MD 11/18/17 2334

## 2017-11-18 NOTE — ED Provider Notes (Signed)
Cough, SOB wheezing No profound pna but CXR read states "RLL opacity, less like PNA". He is the clinical picture of infection. Wheezing rhonchi improved with nebulizer, however, there is desaturation to 88% with moving around the room.   Plan: Getting 2nd duoneb - re-eval Ambulate with pulse ox Admit vs d/ch  Pulse ox improved significantly. 98% with ambulation. Patient reports he feels much better and is comfortable with discharge home. Return precautions discussed.    Charlann Lange, PA-C 11/18/17 2157    Milton Ferguson, MD 11/18/17 [phone]

## 2017-11-18 NOTE — ED Notes (Signed)
ED Provider at bedside. 

## 2017-11-18 NOTE — Discharge Instructions (Signed)
Continue to stay well-hydrated. Continue to alternate between Tylenol and Ibuprofen for pain or fever, or use your home pain medication but don't drive or operate machinery with use of this medication. Use REGULAR Mucinex for cough suppression/expectoration of mucus. Use netipot and flonase to help with nasal congestion. May consider over-the-counter Benadryl or other antihistamine to decrease secretions and for help with your symptoms. Use inhaler as directed, as needed for cough/chest congestion/wheezing/shortness of breath. Use other over the counter medications such as coricidin HBP (or any decongestant that says "HBP"). Take antibiotic as directed until completed, starting tomorrow since you were given today's dose here in the ER tonight. STOP SMOKING! Use zofran as directed as needed for nausea. Follow up with your primary care doctor in 5-7 days for recheck of ongoing symptoms. Return to emergency department for emergent changing or worsening of symptoms.

## 2017-11-27 ENCOUNTER — Ambulatory Visit: Payer: Medicaid Other | Admitting: Family Medicine

## 2018-04-08 ENCOUNTER — Ambulatory Visit: Payer: Medicaid Other | Admitting: Family Medicine

## 2018-04-09 ENCOUNTER — Ambulatory Visit: Payer: Medicaid Other | Admitting: Family Medicine

## 2018-04-13 ENCOUNTER — Ambulatory Visit: Payer: Medicaid Other | Admitting: Family Medicine

## 2018-06-12 ENCOUNTER — Encounter: Payer: Self-pay | Admitting: Family Medicine

## 2018-06-12 ENCOUNTER — Ambulatory Visit (INDEPENDENT_AMBULATORY_CARE_PROVIDER_SITE_OTHER): Payer: Medicaid Other | Admitting: Family Medicine

## 2018-06-12 VITALS — BP 168/82 | HR 65 | Temp 98.6°F | Resp 16 | Ht 67.0 in | Wt 149.0 lb

## 2018-06-12 DIAGNOSIS — L309 Dermatitis, unspecified: Secondary | ICD-10-CM

## 2018-06-12 DIAGNOSIS — H6122 Impacted cerumen, left ear: Secondary | ICD-10-CM

## 2018-06-12 MED ORDER — TRIAMCINOLONE ACETONIDE 0.025 % EX OINT: 1.0000 "application " | TOPICAL_OINTMENT | Freq: Two times a day (BID) | CUTANEOUS | 0 refills | Status: DC

## 2018-06-12 NOTE — Progress Notes (Signed)
Patient [facility]   Progress Note: General Provider: Lanae Boast, FNP  SUBJECTIVE:   Chief Complaint  Patient presents with  . Ear Fullness    left ear   . Rash    left thigh     Patient presents with left ear fullness x 1 week. Patient stuck a cotton swab in the ear and experienced bleeding after. Patient denies pain. + decrease in hearing.  Patient also states that he noticed an itchy rash on the left thigh that occurred around the same time. Patient denies contact with new medications, detergents or soaps.    Past Medical History:  Diagnosis Date  . Alcohol abuse   . Allergic rhinitis   . Anxiety    With Post traumatic stress disorder  . Diabetes mellitus without complication [facility])    'sometime last yr'--type 2  . GERD (gastroesophageal reflux disease)   . Headache    "like migraines"  . Hepatitis    he thinks its hep b or c  . Hyperlipidemia   . Hypertension   . Osteoarthritis   . Pancreatitis   . Rheumatoid arthritis(714.0)   . Tobacco user     Social History   Socioeconomic History  . Marital status: Single    Spouse name: Not on file  . Number of children: Not on file  . Years of education: Not on file  . Highest education level: Not on file  Occupational History  . Not on file  Social Needs  . Financial resource strain: Not on file  . Food insecurity:    Worry: Not on file    Inability: Not on file  . Transportation needs:    Medical: Not on file    Non-medical: Not on file  Tobacco Use  . Smoking status: Current Every Day Smoker    Packs/day: 0.40    Years: 25.00    Pack years: 10.00    Types: Cigarettes  . Smokeless tobacco: Never Used  . Tobacco comment: trying  Substance and Sexual Activity  . Alcohol use: Yes    Alcohol/week: 7.0 - 14.0 standard drinks    Types: 7 - 14 Cans of beer per week    Comment: 1-2 beers occasionally  . Drug use: No  . Sexual activity: Not on file  Lifestyle  .  Physical activity:    Days per week: Not on file    Minutes per session: Not on file  . Stress: Not on file  Relationships  . Social connections:    Talks on phone: Not on file    Gets together: Not on file    Attends religious service: Not on file    Active member of club or organization: Not on file    Attends meetings of clubs or organizations: Not on file    Relationship status: Not on file  . Intimate partner violence:    Fear of current or ex partner: Not on file    Emotionally abused: Not on file    Physically abused: Not on file    Forced sexual activity: Not on file  Other Topics Concern  . Not on file  Social History Narrative   Sharyon Cable. Married- 6 male sexual partner.   Denies drug use but has hx of crack cocaine, alcohol and tobacco use.      Review of Systems  Constitutional: Negative.   HENT: Positive for hearing loss. Negative for ear discharge and ear pain.   Eyes:  Negative.   Gastrointestinal: Negative.   Skin: Positive for rash.  Psychiatric/Behavioral: Negative.     OBJECTIVE: BP (!) 168/82 (BP Location: Right Arm, Patient Position: Sitting, Cuff Size: Normal)   Pulse 65   Temp 98.6 F (37 C) (Oral)   Resp 16   Ht 5\' 7"  (1.702 m)   Wt 149 lb (67.6 kg)   SpO2 100%   BMI 23.34 kg/m   Physical Exam  Constitutional: He appears well-developed and well-nourished.  HENT:  Head: Normocephalic and atraumatic.  Right Ear: Hearing, tympanic membrane, external ear and ear canal normal.  Cerumen impaction noted to the left canal. Ear lavage performed by l. Batten, LPN.  TM clear without erythema or effusion. Patient expressed relief.   Skin: Skin is warm, dry and intact. Rash noted. Rash is papular. No erythema.     Nursing note and vitals reviewed.   ASSESSMENT/PLAN:  1. Impacted cerumen of left ear Relief s/p lavage  2. Dermatitis - triamcinolone (KENALOG) 0.025 % ointment; Apply 1 application topically 2 (two) times daily.  Dispense: 30 g;  Refill: 0      The patient was given clear instructions to go to ER or return to medical center if symptoms do not improve, worsen or new problems develop. The patient verbalized understanding and agreed with plan of care.   Ms. Doug Sou. Nathaneil Canary, FNP-BC Patient [facility] [address] [place], [place] [address] [phone]     This note has been created with Dragon speech recognition software and smart phrase technology. Any transcriptional errors are unintentional.

## 2018-06-12 NOTE — Patient Instructions (Signed)
Contact Dermatitis Dermatitis is redness, soreness, and swelling (inflammation) of the skin. Contact dermatitis is a reaction to certain substances that touch the skin. You either touched something that irritated your skin, or you have allergies to something you touched. Follow these instructions at home: [place] your skin as needed.  Apply cool compresses to the affected areas.  Try taking a bath with: ? Epsom salts. Follow the instructions on the package. You can get these at a pharmacy or grocery store. ? Baking soda. Pour a small amount into the bath as told by your doctor. ? Colloidal oatmeal. Follow the instructions on the package. You can get this at a pharmacy or grocery store.  Try applying baking soda paste to your skin. Stir water into baking soda until it looks like paste.  Do not scratch your skin.  Bathe less often.  Bathe in lukewarm water. Avoid using hot water. Medicines  Take or apply over-the-counter and prescription medicines only as told by your doctor.  If you were prescribed an antibiotic medicine, take or apply your antibiotic as told by your doctor. Do not stop taking the antibiotic even if your condition starts to get better. General instructions  Keep all follow-up visits as told by your doctor. This is important.  Avoid the substance that caused your reaction. If you do not know what caused it, keep a journal to try to track what caused it. Write down: ? What you eat. ? What cosmetic products you use. ? What you drink. ? What you wear in the affected area. This includes jewelry.  If you were given a bandage (dressing), take care of it as told by your doctor. This includes when to change and remove it. Contact a doctor if:  You do not get better with treatment.  Your condition gets worse.  You have signs of infection such as: ? Swelling. ? Tenderness. ? Redness. ? Soreness. ? Warmth.  You have a fever.  You have new  symptoms. Get help right away if:  You have a very bad headache.  You have neck pain.  Your neck is stiff.  You throw up (vomit).  You feel very sleepy.  You see red streaks coming from the affected area.  Your bone or joint underneath the affected area becomes painful after the skin has healed.  The affected area turns darker.  You have trouble breathing. This information is not intended to replace advice given to you by your health care provider. Make sure you discuss any questions you have with your health care provider. Document Released: 08/11/2009 Document Revised: 03/21/2016 Document Reviewed: 03/01/2015 Elsevier Interactive Patient Education  2018 [place], Adult The ears produce a substance called earwax that helps keep bacteria out of the ear and protects the skin in the ear canal. Occasionally, earwax can build up in the ear and cause discomfort or hearing loss. What increases the risk? This condition is more likely to develop in people who:  Are male.  Are elderly.  Naturally produce more earwax.  Clean their ears often with cotton swabs.  Use earplugs often.  Use in-ear headphones often.  Wear hearing aids.  Have narrow ear canals.  Have earwax that is overly thick or sticky.  Have eczema.  Are dehydrated.  Have excess hair in the ear canal.  What are the signs or symptoms? Symptoms of this condition include:  Reduced or muffled hearing.  A feeling of fullness in the ear or feeling  that the ear is plugged.  Fluid coming from the ear.  Ear pain.  Ear itch.  Ringing in the ear.  Coughing.  An obvious piece of earwax that can be seen inside the ear canal.  How is this diagnosed? This condition may be diagnosed based on:  Your symptoms.  Your medical history.  An ear exam. During the exam, your health care provider will look into your ear with an instrument called an otoscope.  You may have tests, including  a hearing test. How is this treated? This condition may be treated by:  Using ear drops to soften the earwax.  Having the earwax removed by a health care provider. The health care provider may: ? Flush the ear with water. ? Use an instrument that has a loop on the end (curette). ? Use a suction device.  Surgery to remove the wax buildup. This may be done in severe cases.  Follow these instructions at home:  Take over-the-counter and prescription medicines only as told by your health care provider.  Do not put any objects, including cotton swabs, into your ear. You can clean the opening of your ear canal with a washcloth or facial tissue.  Follow instructions from your health care provider about cleaning your ears. Do not over-clean your ears.  Drink enough fluid to keep your urine clear or pale yellow. This will help to thin the earwax.  Keep all follow-up visits as told by your health care provider. If earwax builds up in your ears often or if you use hearing aids, consider seeing your health care provider for routine, preventive ear cleanings. Ask your health care provider how often you should schedule your cleanings.  If you have hearing aids, clean them according to instructions from the manufacturer and your health care provider. Contact a health care provider if:  You have ear pain.  You develop a fever.  You have blood, pus, or other fluid coming from your ear.  You have hearing loss.  You have ringing in your ears that does not go away.  Your symptoms do not improve with treatment.  You feel like the room is spinning (vertigo). Summary  Earwax can build up in the ear and cause discomfort or hearing loss.  The most common symptoms of this condition include reduced or muffled hearing and a feeling of fullness in the ear or feeling that the ear is plugged.  This condition may be diagnosed based on your symptoms, your medical history, and an ear exam.  This  condition may be treated by using ear drops to soften the earwax or by having the earwax removed by a health care provider.  Do not put any objects, including cotton swabs, into your ear. You can clean the opening of your ear canal with a washcloth or facial tissue. This information is not intended to replace advice given to you by your health care provider. Make sure you discuss any questions you have with your health care provider. Document Released: 11/21/2004 Document Revised: 12/25/2016 Document Reviewed: 12/25/2016 Elsevier Interactive Patient Education  [employer].

## 2019-01-07 ENCOUNTER — Emergency Department (HOSPITAL_COMMUNITY): Payer: Medicaid Other

## 2019-01-07 ENCOUNTER — Other Ambulatory Visit: Payer: Self-pay

## 2019-01-07 ENCOUNTER — Emergency Department (HOSPITAL_COMMUNITY)
Admission: EM | Admit: 2019-01-07 | Discharge: 2019-01-07 | Disposition: A | Discharge status: Home / Self Care | Payer: Medicaid Other | Attending: Emergency Medicine | Admitting: Emergency Medicine

## 2019-01-07 ENCOUNTER — Encounter (HOSPITAL_COMMUNITY): Payer: Self-pay | Admitting: Emergency Medicine

## 2019-01-07 DIAGNOSIS — Z79899 Other long term (current) drug therapy: Secondary | ICD-10-CM | POA: Diagnosis not present

## 2019-01-07 DIAGNOSIS — M7989 Other specified soft tissue disorders: Secondary | ICD-10-CM | POA: Diagnosis not present

## 2019-01-07 DIAGNOSIS — I10 Essential (primary) hypertension: Secondary | ICD-10-CM | POA: Insufficient documentation

## 2019-01-07 DIAGNOSIS — F17210 Nicotine dependence, cigarettes, uncomplicated: Secondary | ICD-10-CM | POA: Diagnosis not present

## 2019-01-07 DIAGNOSIS — M25572 Pain in left ankle and joints of left foot: Secondary | ICD-10-CM | POA: Diagnosis not present

## 2019-01-07 MED ORDER — PREDNISONE 10 MG PO TABS: 50.0000 mg | ORAL_TABLET | Freq: Every day | ORAL | 0 refills | Status: AC

## 2019-01-07 NOTE — ED Provider Notes (Signed)
[facility] EMERGENCY DEPARTMENT Provider Note   CSN: [id number] Arrival date & time: 01/07/19  1130    History   Chief Complaint Chief Complaint  Patient presents with  . Ankle Pain    left    HPI [name] is a 63 y.o. male.     63 year old male presents with complaint of pain and swelling in his left ankle for the past several days, gradually improving.  Patient denies any falls or injuries, no history of gout.  Patient is unable to take NSAIDs, takes hydrocodone for chronic pain.  No other complaints or concerns.     Past Medical History:  Diagnosis Date  . Alcohol abuse   . Allergic rhinitis   . Anxiety    With Post traumatic stress disorder  . Diabetes mellitus without complication [facility])    'sometime last yr'--type 2  . GERD (gastroesophageal reflux disease)   . Headache    "like migraines"  . Hepatitis    he thinks its hep b or c  . Hyperlipidemia   . Hypertension   . Osteoarthritis   . Pancreatitis   . Rheumatoid arthritis(714.0)   . Tobacco user     Patient Active Problem List   Diagnosis Date Noted  . Angioedema 02/09/2017  . Primary localized osteoarthritis of right hip 01/02/2016  . Groin rash 06/16/2013  . DM w/o complication type II ([place]) 06/16/2013  . BPH (benign prostatic hyperplasia) 11/24/2012  . Healthcare maintenance 11/24/2012  . Pancreatitis 10/31/2012  . Chronic pain syndrome 09/18/2011  . 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 Right rotator cuff tear 07/26/2011  . Shoulder pain, right 05/03/2011  . Right ankle pain 01/23/2011  . Left wrist pain 01/23/2011  . Back pain 01/02/2011  . MOLLUSCUM CONTAGIOSUM 12/05/2008  . LEUKOCYTOSIS UNSPECIFIED 03/25/2008  . LIVER FUNCTION TESTS, ABNORMAL 03/25/2008  . ERECTILE DYSFUNCTION 05/12/2007  . Essential hypertension 03/02/2007  . HYPERLIPIDEMIA 01/08/2007  . ANXIETY 01/08/2007  . GERD 01/08/2007  . RHEUMATOID ARTHRITIS 01/08/2007  . OSTEOARTHRITIS 01/08/2007     Past Surgical History:  Procedure Laterality Date  . COLONOSCOPY    . FRACTURE SURGERY     cheek bone fracture   . rtc     right shoulder  -- 2010  . 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   Procedure: TOTAL HIP ARTHROPLASTY ANTERIOR APPROACH;  Surgeon: Renette Butters, MD;  Location: [facility];  Service: Orthopedics;  Laterality: Right;  . WRIST SURGERY     fusion with pins  2007        Home Medications    Prior to Admission medications   Medication Sig Start Date End Date Taking? Authorizing Provider  amLODipine (NORVASC) 5 MG tablet Take 1 tablet (5 mg total) by mouth daily. 02/12/17   Dorena Dew, FNP  EPINEPHrine 0.3 mg/0.3 mL IJ SOAJ injection Inject 0.3 mLs (0.3 mg total) into the muscle once as needed (For anyphylaxis or severe angioedema.). 02/10/17   Lenore Cordia, MD  gabapentin (NEURONTIN) 300 MG capsule Take 1 capsule (300 mg total) by mouth at bedtime. 02/12/17   Dorena Dew, FNP  gemfibrozil (LOPID) 600 MG tablet Take 1 tablet (600 mg total) by mouth 2 (two) times daily before a meal. 02/12/17   Dorena Dew, FNP  hydrochlorothiazide (MICROZIDE) 12.5 MG capsule Take 1 capsule (12.5 mg total) by mouth daily. 02/12/17   Dorena Dew, FNP  HYDROcodone-acetaminophen (NORCO) 10-325 MG tablet Take 1 tablet by mouth 4 (four)  times daily as needed for moderate pain.     [provider]  metFORMIN (GLUCOPHAGE) 500 MG tablet Take 1 tablet (500 mg total) by mouth daily with breakfast. 02/12/17   Dorena Dew, FNP  omega-3 acid ethyl esters (LOVAZA) 1 g capsule Take 1 capsule (1 g total) by mouth daily. 02/12/17   Dorena Dew, FNP  ondansetron (ZOFRAN ODT) 4 MG disintegrating tablet Take 1 tablet (4 mg total) by mouth every 8 (eight) hours as needed for nausea or vomiting. 11/18/17   Street, [place], PA-C  predniSONE (DELTASONE) 10 MG tablet Take 5 tablets (50 mg total) by mouth daily for 5 days. 01/07/19 01/12/19  Tacy Learn, PA-C  triamcinolone  (KENALOG) 0.025 % ointment Apply 1 application topically 2 (two) times daily. 06/12/18   Lanae Boast, FNP    Family History Family History  Problem Relation Age of Onset  . Kidney disease Brother   . Angioedema Maternal Aunt        Tongue swelling    Social History Social History   Tobacco Use  . Smoking status: Current Every Day Smoker    Packs/day: 0.40    Years: 25.00    Pack years: 10.00    Types: Cigarettes  . Smokeless tobacco: Never Used  . Tobacco comment: trying  Substance Use Topics  . Alcohol use: Yes    Alcohol/week: 7.0 - 14.0 standard drinks    Types: 7 - 14 Cans of beer per week    Comment: 1-2 beers occasionally  . Drug use: No     Allergies   Lisinopril and Nsaids   Review of Systems Review of Systems  Constitutional: Negative for fever.  Musculoskeletal: Positive for arthralgias, gait problem, joint swelling and myalgias.  Skin: Negative for color change, rash and wound.  Allergic/Immunologic: Positive for immunocompromised state.  Neurological: Negative for weakness and numbness.  Psychiatric/Behavioral: Negative for confusion.     Physical Exam Updated Vital Signs BP (!) 153/85 (BP Location: Left Arm)   Pulse 64   Temp 98.3 F (36.8 C) (Oral)   Resp 18   Ht 5\' 7"  (1.702 m)   Wt 68 kg   SpO2 97%   BMI 23.49 kg/m   Physical Exam Vitals signs and nursing note reviewed.  Constitutional:      General: He is not in acute distress.    Appearance: He is well-developed. He is not diaphoretic.  HENT:     Head: Normocephalic and atraumatic.  Cardiovascular:     Pulses: Normal pulses.  Pulmonary:     Effort: Pulmonary effort is normal.  Musculoskeletal:        General: Swelling and tenderness present. No signs of injury.     Left ankle: He exhibits decreased range of motion and swelling. He exhibits no ecchymosis, no deformity, no laceration and normal pulse. Tenderness. Lateral malleolus and medial malleolus tenderness found. No head  of 5th metatarsal and no proximal fibula tenderness found.     Right lower leg: No edema.     Left lower leg: No edema.       Feet:  Skin:    General: Skin is warm and dry.     Findings: No bruising, erythema or rash.  Neurological:     Mental Status: He is alert and oriented to person, place, and time.  Psychiatric:        Behavior: Behavior normal.      ED Treatments / Results  Labs (all labs ordered are  listed, but only abnormal results are displayed) Labs Reviewed - No data to display  EKG None  Radiology Dg Ankle Complete Left  Addendum Date: 01/07/2019   ADDENDUM REPORT: 01/07/2019 13:31 ADDENDUM: Corrected report (down time error) CLINICAL DATA:  Pain and swelling for 2 days.  No known trauma. EXAM: LEFT ANKLE COMPLETE-3+ VIEW COMPARISON:  None FINDINGS: There is soft tissue swelling along the LATERAL aspect of the ankle. No acute fracture or subluxation. No radiopaque foreign body or soft tissue gas. IMPRESSION: Soft tissue swelling.  No acute fracture. Electronically Signed   By: Nolon Nations M.D.   On: 01/07/2019 13:31   Result Date: 01/07/2019 CLINICAL DATA:  Pain and swelling for 2 days, no known trauma EXAM: LEFT ANKLE COMPLETE - 3+ VIEW COMPARISON:  None. FINDINGS: There is no evidence of fracture, dislocation, or joint effusion. There is no evidence of arthropathy or other focal bone abnormality. Soft tissues are unremarkable. IMPRESSION: Negative. Electronically Signed: By: Nolon Nations M.D. On: 01/07/2019 13:26    Procedures Procedures (including critical care time)  Medications Ordered in ED Medications - No data to display   Initial Impression / Assessment and Plan / ED Course  I have reviewed the triage vital signs and the nursing notes.  Pertinent labs & imaging results that were available during my care of the patient were reviewed by me and considered in my medical decision making (see chart for details).  Clinical Course as of Jan 07 1347   Thu Jan 06, 8261  5329 63 year old male presents with complaint of left ankle pain and swelling, gradually improving, no history of injury.  X-ray is negative.  Do not suspect septic arthritis or gout.  Patient has hydrocodone for pain, unable to take anti-inflammatories.  Patient was given prescription for prednisone to take for a few days as well as ankle ASO.  Patient to follow-up with PCP.   [LM]    Clinical Course User Index [LM] Tacy Learn, PA-C   Final Clinical Impressions(s) / ED Diagnoses   Final diagnoses:  Acute left ankle pain    ED Discharge Orders         Ordered    predniSONE (DELTASONE) 10 MG tablet  Daily     01/07/19 1341           Tacy Learn, PA-C 01/07/19 1348    Maudie Flakes, MD 01/09/19 [phone]

## 2019-01-07 NOTE — Discharge Instructions (Addendum)
Wear ankle support as needed for pain.  Continue taking your Norco as prescribed for pain.  Follow-up with your primary care provider. Take prednisone as prescribed.

## 2019-01-07 NOTE — ED Triage Notes (Signed)
Pt reports left ankle pain that just started on 01/06/2019. Pt denies any injury.

## 2019-01-07 NOTE — ED Notes (Signed)
Discharge instructions discussed with Pt. Pt verbalized understanding. Pt stable and ambulatory.    

## 2019-02-10 ENCOUNTER — Encounter: Payer: Self-pay | Admitting: Family Medicine

## 2019-02-10 ENCOUNTER — Other Ambulatory Visit: Payer: Self-pay

## 2019-02-10 ENCOUNTER — Ambulatory Visit (INDEPENDENT_AMBULATORY_CARE_PROVIDER_SITE_OTHER): Payer: Medicaid Other | Admitting: Family Medicine

## 2019-02-10 VITALS — BP 146/70 | HR 60 | Temp 97.8°F | Ht 67.0 in | Wt 160.6 lb

## 2019-02-10 DIAGNOSIS — I10 Essential (primary) hypertension: Secondary | ICD-10-CM

## 2019-02-10 DIAGNOSIS — Z125 Encounter for screening for malignant neoplasm of prostate: Secondary | ICD-10-CM

## 2019-02-10 DIAGNOSIS — M25572 Pain in left ankle and joints of left foot: Secondary | ICD-10-CM

## 2019-02-10 DIAGNOSIS — Z1211 Encounter for screening for malignant neoplasm of colon: Secondary | ICD-10-CM

## 2019-02-10 DIAGNOSIS — L600 Ingrowing nail: Secondary | ICD-10-CM

## 2019-02-10 DIAGNOSIS — E785 Hyperlipidemia, unspecified: Secondary | ICD-10-CM | POA: Diagnosis not present

## 2019-02-10 DIAGNOSIS — E118 Type 2 diabetes mellitus with unspecified complications: Secondary | ICD-10-CM

## 2019-02-10 DIAGNOSIS — J0110 Acute frontal sinusitis, unspecified: Secondary | ICD-10-CM | POA: Diagnosis not present

## 2019-02-10 DIAGNOSIS — G629 Polyneuropathy, unspecified: Secondary | ICD-10-CM | POA: Diagnosis not present

## 2019-02-10 LAB — POCT GLYCOSYLATED HEMOGLOBIN (HGB A1C): Hemoglobin A1C: 6.3 % — AB (ref 4.0–5.6)

## 2019-02-10 LAB — POCT URINALYSIS DIP (MANUAL ENTRY)
Bilirubin, UA: NEGATIVE
Glucose, UA: NEGATIVE mg/dL
Ketones, POC UA: NEGATIVE mg/dL
Nitrite, UA: NEGATIVE
Protein Ur, POC: >=300 mg/dL — AB
Spec Grav, UA: >=1.03 — AB
Urobilinogen, UA: 0.2 U/dL
pH, UA: 5.5

## 2019-02-10 MED ORDER — AMLODIPINE BESYLATE 5 MG PO TABS: 5.0000 mg | ORAL_TABLET | Freq: Every day | ORAL | 1 refills | Status: DC

## 2019-02-10 MED ORDER — OMEGA-3-ACID ETHYL ESTERS 1 G PO CAPS: 1.0000 g | ORAL_CAPSULE | Freq: Every day | ORAL | 1 refills | Status: DC

## 2019-02-10 MED ORDER — GABAPENTIN 600 MG PO TABS: 600.0000 mg | ORAL_TABLET | Freq: Three times a day (TID) | ORAL | 2 refills | Status: DC

## 2019-02-10 MED ORDER — CEPHALEXIN 500 MG PO CAPS: 500.0000 mg | ORAL_CAPSULE | Freq: Three times a day (TID) | ORAL | 0 refills | Status: AC

## 2019-02-10 MED ORDER — METFORMIN HCL 500 MG PO TABS: 500.0000 mg | ORAL_TABLET | Freq: Every day | ORAL | 1 refills | Status: DC

## 2019-02-10 NOTE — Progress Notes (Signed)
Patient [facility]   Progress Note: General Provider: Lanae Boast, FNP  SUBJECTIVE:   [name] is a 63 y.o. male who  has a past medical history of Alcohol abuse, Allergic rhinitis, Anxiety, Diabetes mellitus without complication ([place]), GERD (gastroesophageal reflux disease), Headache, Hepatitis, Hyperlipidemia, Hypertension, Osteoarthritis, Pancreatitis, Rheumatoid arthritis(714.0), and Tobacco user.. Patient presents today for Edema (both feet) and Hip Pain (right hip) Hip Pain   The incident occurred more than 1 week ago. There was no injury mechanism. The pain is present in the left hip, left ankle and right foot. The quality of the pain is described as aching. The pain is at a severity of 4/10. The pain has been intermittent since onset. Associated symptoms include tingling (bilateral lower extremities). He reports no foreign bodies present. The symptoms are aggravated by movement. He has tried acetaminophen (narcotic pain medication) for the symptoms. The treatment provided moderate relief.  Hypertension  This is a chronic problem. The problem is unchanged. The problem is controlled. Associated symptoms include peripheral edema. Pertinent negatives include no headaches or shortness of breath. Risk factors for coronary artery disease include male gender, smoking/tobacco exposure, sedentary lifestyle, dyslipidemia and diabetes mellitus. Past treatments include ACE inhibitors. The current treatment provides moderate improvement. Compliance problems include psychosocial issues.   Diabetes  He presents for his follow-up diabetic visit. He has type 2 (prediabetes) diabetes mellitus. No MedicAlert identification noted. Pertinent negatives for hypoglycemia include no headaches. Diabetic complications include peripheral neuropathy. Risk factors for coronary artery disease include dyslipidemia, male sex, hypertension, sedentary lifestyle and tobacco  exposure. Current diabetic treatment includes oral agent (monotherapy). He is compliant with treatment most of the time. His weight is stable. When asked about meal planning, he reported none. He has not had a previous visit with a dietitian. He rarely participates in exercise. There is no change in his home blood glucose trend. He does not see a podiatrist.    Review of Systems  Constitutional: Negative.   HENT: Negative.   Eyes: Negative.   Respiratory: Negative.  Negative for shortness of breath.   Cardiovascular: Negative.   Gastrointestinal: Negative.   Genitourinary: Negative.   Musculoskeletal: Negative.   Skin: Negative.   Neurological: Positive for tingling (bilateral lower extremities). Negative for headaches.  Psychiatric/Behavioral: Negative.      OBJECTIVE: BP (!) 146/70 (BP Location: Left Arm, Patient Position: Sitting, Cuff Size: Small)   Pulse 60   Temp 97.8 F (36.6 C) (Oral)   Ht 5\' 7"  (1.702 m)   Wt 160 lb 9.6 oz (72.8 kg)   SpO2 97%   BMI 25.15 kg/m   Wt Readings from Last 3 Encounters:  02/10/19 160 lb 9.6 oz (72.8 kg)  01/07/19 150 lb (68 kg)  06/12/18 149 lb (67.6 kg)     Physical Exam Vitals signs and nursing note reviewed.  Constitutional:      General: He is not in acute distress.    Appearance: He is well-developed.  HENT:     Head: Normocephalic and atraumatic.     Nose: Nasal tenderness, mucosal edema, congestion and rhinorrhea present.     Right Sinus: Frontal sinus tenderness present.     Left Sinus: Frontal sinus tenderness present.  Eyes:     Conjunctiva/sclera: Conjunctivae normal.     Pupils: Pupils are equal, round, and reactive to light.  Neck:     Musculoskeletal: Normal range of motion.  Cardiovascular:     Rate and Rhythm:  Normal rate and regular rhythm.     Heart sounds: Normal heart sounds.  Pulmonary:     Effort: Pulmonary effort is normal. No respiratory distress.     Breath sounds: Normal breath sounds.  Abdominal:      General: Bowel sounds are normal. There is no distension.     Palpations: Abdomen is soft.  Musculoskeletal: Normal range of motion.  Skin:    General: Skin is warm and dry.  Neurological:     Mental Status: He is alert and oriented to person, place, and time.  Psychiatric:        Behavior: Behavior normal.        Thought Content: Thought content normal.       ASSESSMENT/PLAN:  1. Type 2 diabetes mellitus with complication, without long-term current use of insulin (HCC) - POCT glycosylated hemoglobin (Hb A1C) - POCT urinalysis dipstick - Comprehensive metabolic panel - Ambulatory referral to Podiatry - metFORMIN (GLUCOPHAGE) 500 MG tablet; Take 1 tablet (500 mg total) by mouth daily with breakfast.  Dispense: 90 tablet; Refill: 1  2. Arthralgia of left ankle - Uric Acid  3. Prostate cancer screening - PSA  4. Ingrown nail - Ambulatory referral to Podiatry  5. Screen for colon cancer - Ambulatory referral to Gastroenterology  6. Essential hypertension - amLODipine (NORVASC) 5 MG tablet; Take 1 tablet (5 mg total) by mouth daily.  Dispense: 90 tablet; Refill: 1  7. Neuropathy - gabapentin (NEURONTIN) 600 MG tablet; Take 1 tablet (600 mg total) by mouth 3 (three) times daily.  Dispense: 90 tablet; Refill: 2  8. Hyperlipidemia, unspecified hyperlipidemia type omega-3 acid ethyl esters (LOVAZA) 1 g capsule; Take 1 capsule (1 g total) by mouth daily.  Dispense: 90 capsule; Refill: 1  9. Acute non-recurrent frontal sinusitis - cephALEXin (KEFLEX) 500 MG capsule; Take 1 capsule (500 mg total) by mouth 3 (three) times daily for 7 days.  Dispense: 21 capsule; Refill: 0    Return in about 3 months (around 05/12/2019) for DM and neuropathy.    The patient was given clear instructions to go to ER or return to medical center if symptoms do not improve, worsen or new problems develop. The patient verbalized understanding and agreed with plan of care.   Ms. Doug Sou. Nathaneil Canary,  FNP-BC [facility] [address] [place], [place] [address] [phone]

## 2019-02-10 NOTE — Patient Instructions (Signed)
Ingrown Toenail An ingrown toenail occurs when the corner or sides of a toenail grow into the surrounding skin. This causes discomfort and pain. The big toe is most commonly affected, but any of the toes can be affected. If an ingrown toenail is not treated, it can become infected. What are the causes? This condition may be caused by:  Wearing shoes that are too small or tight.  An injury, such as stubbing your toe or having your toe stepped on.  Improper cutting or care of your toenails.  Having nail or foot abnormalities that were present from birth (congenital abnormalities), such as having a nail that is too big for your toe. What increases the risk? The following factors may make you more likely to develop ingrown toenails:  Age. Nails tend to get thicker with age, so ingrown nails are more common among older people.  Cutting your toenails incorrectly, such as cutting them very short or cutting them unevenly. An ingrown toenail is more likely to get infected if you have:  Diabetes.  Blood flow (circulation) problems. What are the signs or symptoms? Symptoms of an ingrown toenail may include:  Pain, soreness, or tenderness.  Redness.  Swelling.  Hardening of the skin that surrounds the toenail. Signs that an ingrown toenail may be infected include:  Fluid or pus.  Symptoms that get worse instead of better. How is this diagnosed? An ingrown toenail may be diagnosed based on your medical history, your symptoms, and a physical exam. If you have fluid or blood coming from your toenail, a sample may be collected to test for the specific type of bacteria that is causing the infection. How is this treated? Treatment depends on how severe your ingrown toenail is. You may be able to care for your toenail at home.  If you have an infection, you may be prescribed antibiotic medicines.  If you have fluid or pus draining from your toenail, your health care provider may drain it.   If you have trouble walking, you may be given crutches to use.  If you have a severe or infected ingrown toenail, you may need a procedure to remove part or all of the nail. Follow these instructions at home: Foot care   Do not pick at your toenail or try to remove it yourself.  Soak your foot in warm, soapy water. Do this for 20 minutes, 3 times a day, or as often as told by your health care provider. This helps to keep your toe clean and keep your skin soft.  Wear shoes that fit well and are not too tight. Your health care provider may recommend that you wear open-toed shoes while you heal.  Trim your toenails regularly and carefully. Cut your toenails straight across to prevent injury to the skin at the corners of the toenail. Do not cut your nails in a curved shape.  Keep your feet clean and dry to help prevent infection. Medicines  Take over-the-counter and prescription medicines only as told by your health care provider.  If you were prescribed an antibiotic, take it as told by your health care provider. Do not stop taking the antibiotic even if you start to feel better. Activity  Return to your normal activities as told by your health care provider. Ask your health care provider what activities are safe for you.  Avoid activities that cause pain. General instructions  If your health care provider told you to use crutches to help you move around, use them   as instructed.  Keep all follow-up visits as told by your health care provider. This is important. Contact a health care provider if:  You have more redness, swelling, pain, or other symptoms that do not improve with treatment.  You have fluid, blood, or pus coming from your toenail. Get help right away if:  You have a red streak on your skin that starts at your foot and spreads up your leg.  You have a fever. Summary  An ingrown toenail occurs when the corner or sides of a toenail grow into the surrounding skin.  This causes discomfort and pain. The big toe is most commonly affected, but any of the toes can be affected.  If an ingrown toenail is not treated, it can become infected.  Fluid or pus draining from your toenail is a sign of infection. Your health care provider may need to drain it. You may be given antibiotics to treat the infection.  Trimming your toenails regularly and properly can help you prevent an ingrown toenail. This information is not intended to replace advice given to you by your health care provider. Make sure you discuss any questions you have with your health care provider. Document Released: 10/11/2000 Document Revised: 07/02/2017 Document Reviewed: 07/02/2017 Elsevier Interactive Patient Education  2019 [employer] Hypertension Hypertension is another name for high blood pressure. High blood pressure forces your heart to work harder to pump blood. This can cause problems over time. There are two numbers in a blood pressure reading. There is a top number (systolic) over a bottom number (diastolic). It is best to have a blood pressure below 120/80. Healthy choices can help lower your blood pressure. You may need medicine to help lower your blood pressure if:  Your blood pressure cannot be lowered with healthy choices.  Your blood pressure is higher than 130/80. Follow these instructions at home: Eating and drinking   If directed, follow the DASH eating plan. This diet includes: ? Filling half of your plate at each meal with fruits and vegetables. ? Filling one quarter of your plate at each meal with whole grains. Whole grains include whole wheat pasta, brown rice, and whole grain bread. ? Eating or drinking low-fat dairy products, such as skim milk or low-fat yogurt. ? Filling one quarter of your plate at each meal with low-fat (lean) proteins. Low-fat proteins include fish, skinless chicken, eggs, beans, and tofu. ? Avoiding fatty meat, cured and processed meat, or  chicken with skin. ? Avoiding premade or processed food.  Eat less than 1,500 mg of salt (sodium) a day.  Limit alcohol use to no more than 1 drink a day for nonpregnant women and 2 drinks a day for men. One drink equals 12 oz of beer, 5 oz of wine, or 1 oz of hard liquor. Lifestyle  Work with your doctor to stay at a healthy weight or to lose weight. Ask your doctor what the best weight is for you.  Get at least 30 minutes of exercise that causes your heart to beat faster (aerobic exercise) most days of the week. This may include walking, swimming, or biking.  Get at least 30 minutes of exercise that strengthens your muscles (resistance exercise) at least 3 days a week. This may include lifting weights or pilates.  Do not use any products that contain nicotine or tobacco. This includes cigarettes and e-cigarettes. If you need help quitting, ask your doctor.  Check your blood pressure at home as told by your doctor.  Keep all follow-up visits as told by your doctor. This is important. Medicines  Take over-the-counter and prescription medicines only as told by your doctor. Follow directions carefully.  Do not skip doses of blood pressure medicine. The medicine does not work as well if you skip doses. Skipping doses also puts you at risk for problems.  Ask your doctor about side effects or reactions to medicines that you should watch for. Contact a doctor if:  You think you are having a reaction to the medicine you are taking.  You have headaches that keep coming back (recurring).  You feel dizzy.  You have swelling in your ankles.  You have trouble with your vision. Get help right away if:  You get a very bad headache.  You start to feel confused.  You feel weak or numb.  You feel faint.  You get very bad pain in your: ? Chest. ? Belly (abdomen).  You throw up (vomit) more than once.  You have trouble breathing. Summary  Hypertension is another name for high  blood pressure.  Making healthy choices can help lower blood pressure. If your blood pressure cannot be controlled with healthy choices, you may need to take medicine. This information is not intended to replace advice given to you by your health care provider. Make sure you discuss any questions you have with your health care provider. Document Released: 04/01/2008 Document Revised: 09/11/2016 Document Reviewed: 09/11/2016 Elsevier Interactive Patient Education  2019 [employer].

## 2019-02-10 NOTE — Addendum Note (Signed)
Addended by: Genelle Bal on: 02/10/2019 04:04 PM   Modules accepted: Orders

## 2019-02-11 LAB — COMPREHENSIVE METABOLIC PANEL
ALT: 20 IU/L (ref 0–44)
AST: 22 IU/L (ref 0–40)
Albumin/Globulin Ratio: 1.7 (ref 1.2–2.2)
Albumin: 4.7 g/dL (ref 3.8–4.8)
Alkaline Phosphatase: 66 IU/L (ref 39–117)
BUN/Creatinine Ratio: 11 (ref 10–24)
BUN: 11 mg/dL (ref 8–27)
Bilirubin Total: 0.9 mg/dL (ref 0.0–1.2)
CO2: 22 mmol/L (ref 20–29)
Calcium: 9.6 mg/dL (ref 8.6–10.2)
Chloride: 96 mmol/L (ref 96–106)
Creatinine, Ser: 1.01 mg/dL (ref 0.76–1.27)
GFR calc Af Amer: 92 mL/min/{1.73_m2} (ref >59)
GFR calc non Af Amer: 79 mL/min/{1.73_m2} (ref >59)
Globulin, Total: 2.8 g/dL (ref 1.5–4.5)
Glucose: 101 mg/dL — ABNORMAL HIGH (ref 65–99)
Potassium: 3.7 mmol/L (ref 3.5–5.2)
Sodium: 136 mmol/L (ref 134–144)
Total Protein: 7.5 g/dL (ref 6.0–8.5)

## 2019-02-11 LAB — URIC ACID: Uric Acid: 9.2 mg/dL — ABNORMAL HIGH (ref 3.7–8.6)

## 2019-02-11 LAB — PSA: Prostate Specific Ag, Serum: 0.9 ng/mL (ref 0.0–4.0)

## 2019-02-11 MED ORDER — PREDNISONE 10 MG (21) PO TBPK: ORAL_TABLET | ORAL | 0 refills | Status: DC

## 2019-02-11 NOTE — Addendum Note (Signed)
Addended by: Genelle Bal on: 02/11/2019 03:13 PM   Modules accepted: Orders

## 2019-02-12 ENCOUNTER — Telehealth: Payer: Self-pay

## 2019-02-12 LAB — URINE CULTURE

## 2019-02-12 NOTE — Telephone Encounter (Signed)
-----   Message from Lanae Boast, [place] sent at 02/12/2019  8:33 AM EDT ----- Please let patient know that his joint pain may be coming from a gout attack. His uric acid levels were high. I will send in a prescription for prednisone to the pharmacy on file. Hopefully he will have relief with this. His prostate levels were norm al.

## 2019-02-12 NOTE — Progress Notes (Signed)
Please let patient know that his joint pain may be coming from a gout attack. His uric acid levels were high. I will send in a prescription for prednisone to the pharmacy on file. Hopefully he will have relief with this. His prostate levels were normal.

## 2019-02-12 NOTE — Telephone Encounter (Signed)
Called and spoke with patient, advised that joint pain may be a gout attack due to uric acid levels being elevated. Advised that we are sending in a prescription for prednisone to the pharmacy. Asked that he pick this up and take as directed. Advised that prostates levels were normal. Patient verbalized understanding. Thanks!

## 2019-02-14 ENCOUNTER — Encounter (HOSPITAL_COMMUNITY): Payer: Self-pay

## 2019-02-14 ENCOUNTER — Emergency Department (HOSPITAL_COMMUNITY)
Admission: EM | Admit: 2019-02-14 | Discharge: 2019-02-14 | Disposition: A | Discharge status: Home / Self Care | Payer: Medicaid Other | Attending: Emergency Medicine | Admitting: Emergency Medicine

## 2019-02-14 DIAGNOSIS — S161XXA Strain of muscle, fascia and tendon at neck level, initial encounter: Secondary | ICD-10-CM | POA: Diagnosis not present

## 2019-02-14 DIAGNOSIS — Y9389 Activity, other specified: Secondary | ICD-10-CM | POA: Insufficient documentation

## 2019-02-14 DIAGNOSIS — I10 Essential (primary) hypertension: Secondary | ICD-10-CM | POA: Insufficient documentation

## 2019-02-14 DIAGNOSIS — Z79899 Other long term (current) drug therapy: Secondary | ICD-10-CM | POA: Insufficient documentation

## 2019-02-14 DIAGNOSIS — M545 Low back pain: Secondary | ICD-10-CM | POA: Diagnosis not present

## 2019-02-14 DIAGNOSIS — M25512 Pain in left shoulder: Secondary | ICD-10-CM | POA: Insufficient documentation

## 2019-02-14 DIAGNOSIS — Z96641 Presence of right artificial hip joint: Secondary | ICD-10-CM | POA: Insufficient documentation

## 2019-02-14 DIAGNOSIS — F17210 Nicotine dependence, cigarettes, uncomplicated: Secondary | ICD-10-CM | POA: Diagnosis not present

## 2019-02-14 DIAGNOSIS — Y999 Unspecified external cause status: Secondary | ICD-10-CM | POA: Diagnosis not present

## 2019-02-14 DIAGNOSIS — Y92481 Parking lot as the place of occurrence of the external cause: Secondary | ICD-10-CM | POA: Insufficient documentation

## 2019-02-14 DIAGNOSIS — G8929 Other chronic pain: Secondary | ICD-10-CM | POA: Diagnosis not present

## 2019-02-14 DIAGNOSIS — Z7984 Long term (current) use of oral hypoglycemic drugs: Secondary | ICD-10-CM | POA: Insufficient documentation

## 2019-02-14 DIAGNOSIS — E119 Type 2 diabetes mellitus without complications: Secondary | ICD-10-CM | POA: Insufficient documentation

## 2019-02-14 DIAGNOSIS — S199XXA Unspecified injury of neck, initial encounter: Secondary | ICD-10-CM | POA: Diagnosis present

## 2019-02-14 MED ORDER — METHOCARBAMOL 500 MG PO TABS: 500.0000 mg | ORAL_TABLET | Freq: Two times a day (BID) | ORAL | 0 refills | Status: DC

## 2019-02-14 MED ORDER — HYDROCODONE-ACETAMINOPHEN 5-325 MG PO TABS
2.0000 | ORAL_TABLET | Freq: Once | ORAL | Status: AC
  Administered 2019-02-14: 2 via ORAL
  Filled 2019-02-14: qty 2

## 2019-02-14 NOTE — ED Notes (Signed)
Patient verbalizes understanding of discharge instructions. Opportunity for questioning and answers were provided. Armband removed by staff, pt discharged from ED. Pt ambulatory to lobby. Prescriptions reviewed.
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
## 2019-02-14 NOTE — Discharge Instructions (Signed)
Take Tylenol as needed for the next week. Take this medicine with food. Take muscle relaxer at bedtime to help you sleep. This medicine makes you drowsy so do not take before driving or work Use a heating pad for sore muscles - use for 20 minutes several times a day Return for worsening symptoms
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
## 2019-02-15 ENCOUNTER — Telehealth: Payer: Self-pay

## 2019-02-15 DIAGNOSIS — M79644 Pain in right finger(s): Secondary | ICD-10-CM | POA: Diagnosis not present

## 2019-02-15 DIAGNOSIS — M542 Cervicalgia: Secondary | ICD-10-CM
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
## 2019-02-18 ENCOUNTER — Telehealth: Payer: Self-pay

## 2019-02-18 NOTE — Telephone Encounter (Signed)
Called and spoke with patient. He was asking for appointments for referrals. I have viewed these appointments in epic and have him dates/times. Thanks!

## 2019-02-22 ENCOUNTER — Ambulatory Visit (INDEPENDENT_AMBULATORY_CARE_PROVIDER_SITE_OTHER): Payer: Medicaid Other

## 2019-02-22 ENCOUNTER — Ambulatory Visit (INDEPENDENT_AMBULATORY_CARE_PROVIDER_SITE_OTHER): Payer: Medicaid Other | Admitting: Family Medicine

## 2019-02-22 ENCOUNTER — Other Ambulatory Visit: Payer: Self-pay

## 2019-02-22 ENCOUNTER — Encounter (INDEPENDENT_AMBULATORY_CARE_PROVIDER_SITE_OTHER): Payer: Self-pay | Admitting: Family Medicine

## 2019-02-22 DIAGNOSIS — M79644 Pain in right finger(s): Secondary | ICD-10-CM

## 2019-02-22 DIAGNOSIS — M542 Cervicalgia: Secondary | ICD-10-CM

## 2019-02-22 DIAGNOSIS — M79642 Pain in left hand: Secondary | ICD-10-CM | POA: Diagnosis not present

## 2019-02-22 DIAGNOSIS — M25522 Pain in left elbow: Secondary | ICD-10-CM | POA: Diagnosis not present
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
## 2019-02-24 ENCOUNTER — Other Ambulatory Visit: Payer: Self-pay

## 2019-02-24 ENCOUNTER — Encounter: Payer: Self-pay | Admitting: Podiatry

## 2019-02-24 ENCOUNTER — Ambulatory Visit: Payer: Medicaid Other | Admitting: Podiatry

## 2019-02-24 VITALS — BP 117/62 | HR 46 | Temp 97.5°F | Resp 16

## 2019-02-24 DIAGNOSIS — M79674 Pain in right toe(s): Secondary | ICD-10-CM | POA: Diagnosis not present

## 2019-02-24 DIAGNOSIS — L600 Ingrowing nail: Secondary | ICD-10-CM | POA: Diagnosis not present

## 2019-02-24 DIAGNOSIS — B351 Tinea unguium: Secondary | ICD-10-CM

## 2019-02-24 DIAGNOSIS — M79675 Pain in left toe(s): Secondary | ICD-10-CM

## 2019-02-24 MED ORDER — NEOMYCIN-POLYMYXIN-HC 3.5-10000-1 OT SOLN: OTIC | 1 refills | Status: DC

## 2019-02-24 NOTE — Progress Notes (Signed)
Subjective:   Patient ID: [name], male   DOB: 63 y.o.   MRN: [id number]   HPI Patient presents with chronic ingrown toenails of both feet stating they have been sore and making it hard to wear shoe gear comfortably.  Patient states he tries to trim them and is tried other modalities without relief and the big toenails are awful and the other nails are still very sore and he cannot cut them.  Patient states is gradually become more of an issue as time is gone on and patient smokes very lightly today at this time and likes to be active   Review of Systems  All other systems reviewed and are negative.       Objective:  Physical Exam Vitals signs and nursing note reviewed.  Constitutional:      Appearance: He is well-developed.  Pulmonary:     Effort: Pulmonary effort is normal.  Musculoskeletal: Normal range of motion.  Skin:    General: Skin is warm.  Neurological:     Mental Status: He is alert.     Neurovascular status intact muscle strength found to be adequate with patient found to have very thickened incurvated hallux nails bilateral that are painful when palpated and make shoe gear difficult.  Remaining nails 2 through 5 bilateral are thickened dystrophic and impossible for him to cut.  Has good digital perfusion well oriented x3     Assessment:  Damaged hallux nails bilateral with pain along with mycotic nails 2 through 5 bilateral     Plan:  H&P x-rays reviewed and today I recommended nail removal of the big toes nails bilateral.  I explained procedure and risk and patient wants surgery understanding risk and understands they will be permanent procedures.  I infiltrated each hallux 60 mg like Marcaine mixture remove the hallux nails exposed matrix applied phenol 5 applications 30 seconds followed alcohol by sterile dressing gave instructions on soaks and leave dressings on 24 hours but take them off earlier if issues should occur and wrote prescription for drops and  encouraged him to call with any questions concerns which may collect during the postoperative.  I went ahead today and debrided all remaining nails 2 through 5 with no iatrogenic bleeding signed visit

## 2019-02-24 NOTE — Patient Instructions (Signed)

## 2019-02-24 NOTE — Progress Notes (Signed)
   Subjective:    Patient ID: [name], male    DOB: 12-Mar-1956, 63 y.o.   MRN: [id number]  HPI    Review of Systems  All other systems reviewed and are negative.      Objective:   Physical Exam        Assessment & Plan:

## 2019-03-02 ENCOUNTER — Other Ambulatory Visit: Payer: Self-pay

## 2019-03-02 ENCOUNTER — Ambulatory Visit: Payer: Medicaid Other | Attending: Family Medicine

## 2019-03-02 DIAGNOSIS — M25522 Pain in left elbow: Secondary | ICD-10-CM | POA: Insufficient documentation

## 2019-03-02 DIAGNOSIS — M542 Cervicalgia: Secondary | ICD-10-CM | POA: Diagnosis not present

## 2019-03-02 DIAGNOSIS — M25512 Pain in left shoulder: Secondary | ICD-10-CM | POA: Insufficient documentation

## 2019-03-02 DIAGNOSIS — M25622 Stiffness of left elbow, not elsewhere classified: Secondary | ICD-10-CM | POA: Insufficient documentation
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
## 2019-03-02 NOTE — Patient Instructions (Signed)
Cervical rotation , elbow extension 34-x/day 3-4 reps to tolerance going slightly more than he wants.   Sit with support and pull head back when sitting for good posture

## 2019-03-04 ENCOUNTER — Telehealth: Payer: Self-pay | Admitting: Podiatry

## 2019-03-04 NOTE — Telephone Encounter (Signed)
Patient calling with questions regarding the medication he was prescribed. In the description for the medication it states that it is for ears.

## 2019-03-04 NOTE — Telephone Encounter (Signed)
I informed pt the Cortisporin drops were to be used for the toenail procedure site.

## 2019-03-08 DIAGNOSIS — M542 Cervicalgia: Secondary | ICD-10-CM | POA: Diagnosis not present

## 2019-03-09 ENCOUNTER — Telehealth: Payer: Self-pay | Admitting: Physical Therapy

## 2019-03-09 ENCOUNTER — Ambulatory Visit: Payer: Medicaid Other

## 2019-03-09 NOTE — Telephone Encounter (Signed)
Spoke to [name] about today's missed appointment,. He said he did not receive the automated message for today and was reminded of his 03/11/19 appointment at 10:15 and he said he would make that appointment. I asked him to write it down so if automated reminder failed he would be able to remind himself.

## 2019-03-11 ENCOUNTER — Other Ambulatory Visit: Payer: Self-pay

## 2019-03-11 ENCOUNTER — Ambulatory Visit: Payer: Medicaid Other

## 2019-03-11 DIAGNOSIS — M25512 Pain in left shoulder: Secondary | ICD-10-CM | POA: Diagnosis not present

## 2019-03-11 DIAGNOSIS — M542 Cervicalgia: Secondary | ICD-10-CM | POA: Diagnosis not present

## 2019-03-11 DIAGNOSIS — M25622 Stiffness of left elbow, not elsewhere classified: Secondary | ICD-10-CM

## 2019-03-11 DIAGNOSIS — M25522 Pain in left elbow: Secondary | ICD-10-CM

## 2019-03-11 NOTE — Therapy (Signed)
[place], [place], [address] Phone: [phone]   Fax:  [phone]  Physical Therapy Treatment  Patient Details  Name: [name] MRN: [id number] Date of Birth: [birth year] Referring Provider (PT): Eunice Blase , MD   Encounter Date: 03/11/2019  PT End of Session - 03/11/19 1023    Visit Number  2    Number of Visits  20    Date for PT Re-Evaluation  05/14/19    Authorization Type  Medicaid    Authorization Time Period  3 sessions end 03/28/19    Authorization - Visit Number  1    Authorization - Number of Visits  3    PT Start Time  0160    PT Stop Time  1093    PT Time Calculation (min)  50 min    Activity Tolerance  Patient limited by pain;Patient tolerated treatment well    Behavior During Therapy  [facility] for tasks assessed/performed       Past Medical History:  Diagnosis Date  . Alcohol abuse   . Allergic rhinitis   . Anxiety    With Post traumatic stress disorder  . Diabetes mellitus without complication [facility])    'sometime last yr'--type 2  . GERD (gastroesophageal reflux disease)   . Headache    "like migraines"  . Hepatitis    he thinks its hep b or c  . Hyperlipidemia   . Hypertension   . Osteoarthritis   . Pancreatitis   . Rheumatoid arthritis(714.0)   . Tobacco user     Past Surgical History:  Procedure Laterality Date  . COLONOSCOPY    . FRACTURE SURGERY     cheek bone fracture   . rtc     right shoulder  -- 2010  . 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   Procedure: TOTAL HIP ARTHROPLASTY ANTERIOR APPROACH;  Surgeon: Renette Butters, MD;  Location: [place];  Service: Orthopedics;  Laterality: Right;  . WRIST SURGERY     fusion with pins  2007    There were no vitals filed for this visit.  Subjective Assessment - 03/11/19 1021    Subjective  Neck a little better with ache and throbbing.  in neck and head    Pain Score  6     Pain Location  Neck    Pain  Orientation  Left    Pain Descriptors / Indicators  Aching;Stabbing;Throbbing    Pain Type  Acute pain    Pain Onset  More than a month ago    Pain Frequency  Constant    Aggravating Factors   sit/walking     Pain Relieving Factors  mes , heat    Pain Location  Elbow    Pain Orientation  Left    Pain Descriptors / Indicators  Aching;Sharp    Pain Type  Acute pain    Pain Onset  More than a month ago    Pain Frequency  Constant    Aggravating Factors   moving    Pain Relieving Factors  rest,  meds                       [facility] Adult PT Treatment/Exercise - 03/11/19 0001      Modalities   Modalities  Moist Heat;Ultrasound      Moist Heat Therapy   Number Minutes Moist Heat  10 Minutes    Moist Heat Location  Elbow;Cervical  Ultrasound   Ultrasound Location  neck    Ultrasound Parameters  100% 1.6Wcm2,1 MHz    Ultrasound Goals  Pain      Manual Therapy   Manual Therapy  Joint mobilization;Soft tissue mobilization;Passive ROM    Joint Mobilization  Gr 1-2 PA to C1 through T2    Soft tissue mobilization  cervical tissue into upper back    Passive ROM  neck rotation , elbow flex/ext/er,                PT Short Term Goals - 03/11/19 1051      PT SHORT TERM GOAL #1   Title  He will demo independence with  initial HEP     Status  On-going      PT SHORT TERM GOAL #2   Title  He will report pain decreased  neck pain 10-20%     Baseline  variable but better    Status  Partially Met      PT SHORT TERM GOAL #3   Title  Patient will increase bilateral cervical rotation by 10 degrees bilateral     Status  Achieved        PT Long Term Goals - 03/02/19 1056      PT LONG TERM GOAL #1   Title  He will demo independent with all HEP issued     Baseline  independent with initial HEP    Time  8    Period  Weeks    Status  New      PT LONG TERM GOAL #2   Title  Pt will report pain in neck a sintermittant    Baseline  constant pain    Period  Weeks     Status  New      PT LONG TERM GOAL #3   Title  He will be able to correct psoture with min to no pain    Baseline  forward head posture    Time  8    Period  Weeks    Status  New      PT LONG TERM GOAL #4   Title  He will use Lt arm for normal selfcare and home tasks with 1-2 max pain.     Baseline  constant shoulder and arm pain    Time  8    Period  Weeks    Status  New            Plan - 03/11/19 1048    Clinical Impression Statement  [name] is much better with improved posture, full LT elbow ROM and he is pushing up equally with his LT arm thugh he reports continued sharp pains in LT elbow. He reports continued nex=ck and head pain but PROM in normal with end range pain and he will move to 45 degrees cervical rotation smoothly.   He is making positive gains    PT Treatment/Interventions  Taping;Passive range of motion;Dry needling;Manual techniques;Ultrasound;Traction;Electrical Stimulation;Moist Heat;Therapeutic exercise;Therapeutic activities;Patient/family education    PT Next Visit Plan   ACtive ROM .  Modalities and manual for spasm and ROM and band for Lt elbow flex/ext ER and scapula retraction with bands if able     PT Home Exercise Plan  cervical rotation , elbow extension , sitting erect for posture    Consulted and Agree with Plan of Care  Patient       Patient will benefit from skilled therapeutic intervention in order to improve the following deficits  and impairments:  Dizziness, Pain, Postural dysfunction, Increased muscle spasms, Decreased strength, Decreased range of motion, Impaired UE functional use  Visit Diagnosis: Cervicalgia  Pain in left elbow  Stiffness of left elbow, not elsewhere classified     Problem List Patient Active Problem List   Diagnosis Date Noted  . Angioedema 02/09/2017  . Primary localized osteoarthritis of right hip 01/02/2016  . Groin rash 06/16/2013  . DM w/o complication type II ([place]) 06/16/2013  . BPH (benign  prostatic hyperplasia) 11/24/2012  . Healthcare maintenance 11/24/2012  . Pancreatitis 10/31/2012  . Chronic pain syndrome 09/18/2011  . 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 Right rotator cuff tear 07/26/2011  . Shoulder pain, right 05/03/2011  . Right ankle pain 01/23/2011  . Left wrist pain 01/23/2011  . Back pain 01/02/2011  . MOLLUSCUM CONTAGIOSUM 12/05/2008  . LEUKOCYTOSIS UNSPECIFIED 03/25/2008  . LIVER FUNCTION TESTS, ABNORMAL 03/25/2008  . ERECTILE DYSFUNCTION 05/12/2007  . Essential hypertension 03/02/2007  . HYPERLIPIDEMIA 01/08/2007  . ANXIETY 01/08/2007  . GERD 01/08/2007  . RHEUMATOID ARTHRITIS 01/08/2007  . OSTEOARTHRITIS 01/08/2007    Darrel Hoover  PT 03/11/2019, 10:55 AM  [facility] [address] [place], [place], [address] Phone: [phone]   Fax:  [phone]  Name: [name] MRN: [id number] Date of Birth: April 26, 1956

## 2019-03-15 ENCOUNTER — Ambulatory Visit: Payer: Self-pay | Admitting: Family Medicine

## 2019-03-16 ENCOUNTER — Ambulatory Visit: Payer: Medicaid Other | Admitting: Physical Therapy

## 2019-03-23 ENCOUNTER — Ambulatory Visit: Payer: Medicaid Other | Admitting: Family Medicine

## 2019-03-24 ENCOUNTER — Ambulatory Visit: Payer: Medicaid Other

## 2019-03-26 ENCOUNTER — Telehealth: Payer: Self-pay

## 2019-03-26 ENCOUNTER — Other Ambulatory Visit: Payer: Self-pay

## 2019-03-26 ENCOUNTER — Ambulatory Visit: Payer: Medicaid Other

## 2019-03-26 ENCOUNTER — Telehealth: Payer: Self-pay | Admitting: *Deleted

## 2019-03-26 NOTE — Telephone Encounter (Signed)
Patient called and was asking about medications for foot/toes that was prescribed by podiatry. I advised he would need to speak with their office. He explained that he has had trouble reaching them and was even in office this morning but left due to a long wait and his ride could not wait any longer. I advised him I would call and see if a Nurse will call him back to help him with these problems.   I called [facility] and spoke with Mateo Flow, she states she will reach out to patient regarding this. Thanks!

## 2019-03-26 NOTE — Telephone Encounter (Signed)
[name] Patient Care states pt was to be seen in office but left because it was taking too long, and needs someone to go over the medications with him.

## 2019-03-26 NOTE — Telephone Encounter (Signed)
I called pt and he states he had been in the area and had stopped in for a walk-in appt and was in the room too long for the person he had waiting in the car, pt asked if he was to use the drops or the neosporin. I told pt he should use the drops and could probably soak once daily and use the drops after the soak and allow the air to dry at bedtime and make an appt to have the toe areas checked. Transferred pt to schedulers.

## 2019-03-27 ENCOUNTER — Encounter: Payer: Self-pay | Admitting: Cardiology

## 2019-03-27 NOTE — Progress Notes (Unsigned)
Pt was on potentially exposure list, patient no showed for appt 5/26.  Patient was not called.

## 2019-03-29 ENCOUNTER — Telehealth: Payer: Self-pay

## 2019-03-29 NOTE — Telephone Encounter (Signed)
Patient called to ask when podiatry appointment was schedule. I advised him I see an appointment for him at their office for Wednesday 03/31/2019 @11 :30am. Patient verbalized understanding. Thanks!

## 2019-03-31 ENCOUNTER — Other Ambulatory Visit: Payer: Self-pay

## 2019-03-31 ENCOUNTER — Encounter: Payer: Self-pay | Admitting: Podiatry

## 2019-03-31 ENCOUNTER — Ambulatory Visit: Payer: Medicaid Other | Admitting: Podiatry

## 2019-03-31 ENCOUNTER — Ambulatory Visit (INDEPENDENT_AMBULATORY_CARE_PROVIDER_SITE_OTHER): Payer: Medicaid Other | Admitting: Podiatry

## 2019-03-31 VITALS — Temp 98.2°F

## 2019-03-31 DIAGNOSIS — L03032 Cellulitis of left toe: Secondary | ICD-10-CM | POA: Diagnosis not present

## 2019-03-31 NOTE — Progress Notes (Signed)
Subjective:   Patient ID: [name], male   DOB: 64 y.o.   MRN: [id number]   HPI Patient states he wanted to get his nailbeds checked as there is been some drainage and he is a diabetic and at times they were bothersome   ROS      Objective:  Physical Exam  Neurovascular status intact negative Homans sign noted with patient found to harbor some slight drainage and will proximal portion of the nail fold hallux bilateral localized in nature with no proximal edema erythema or drainage noted     Assessment:  Possibility for low-grade paronychia infection of the hallux nail bed after removal bilateral with no indication of proximal spread     Plan:  Reviewed different soaking techniques bandaging techniques and utilizing a cream.  This should heal uneventfully but gave strict instructions of any further redness drainage or any proximal edema or redness drainage were to occur to let us know immediately

## 2019-04-12 DIAGNOSIS — M542 Cervicalgia: Secondary | ICD-10-CM | POA: Diagnosis not present

## 2019-05-04 ENCOUNTER — Ambulatory Visit: Payer: Medicaid Other | Admitting: Physical Therapy

## 2019-05-12 ENCOUNTER — Ambulatory Visit: Payer: Medicaid Other | Admitting: Family Medicine

## 2019-05-18 ENCOUNTER — Ambulatory Visit: Payer: Medicaid Other | Attending: Family Medicine | Admitting: Physical Therapy

## 2019-05-18 ENCOUNTER — Other Ambulatory Visit: Payer: Self-pay

## 2019-05-18 ENCOUNTER — Encounter: Payer: Self-pay | Admitting: Physical Therapy

## 2019-05-18 DIAGNOSIS — M25522 Pain in left elbow: Secondary | ICD-10-CM

## 2019-05-18 DIAGNOSIS — M25622 Stiffness of left elbow, not elsewhere classified: Secondary | ICD-10-CM

## 2019-05-18 DIAGNOSIS — M25512 Pain in left shoulder: Secondary | ICD-10-CM | POA: Diagnosis not present

## 2019-05-18 DIAGNOSIS — M542 Cervicalgia: Secondary | ICD-10-CM | POA: Diagnosis not present

## 2019-05-18 NOTE — Therapy (Signed)
[place], [place], [address] Phone: [phone]   Fax:  [phone]  Physical Therapy Treatment  Patient Details  Name: [name] MRN: [id number] Date of Birth: 1955-11-04 Referring Provider (PT): Eunice Blase , MD   Encounter Date: 05/18/2019  PT End of Session - 05/18/19 0917    Visit Number  3    Number of Visits  6    Authorization Type  Medicaid    Authorization Time Period  requesting 3 more visits through 06/08/2019    Authorization - Visit Number  3    Authorization - Number of Visits  3    PT Start Time  0918    PT Stop Time  0955    PT Time Calculation (min)  37 min    Activity Tolerance  Patient tolerated treatment well       Past Medical History:  Diagnosis Date  . Alcohol abuse   . Allergic rhinitis   . Anxiety    With Post traumatic stress disorder  . Diabetes mellitus without complication [facility])    'sometime last yr'--type 2  . GERD (gastroesophageal reflux disease)   . Headache    "like migraines"  . Hepatitis    he thinks its hep b or c  . Hyperlipidemia   . Hypertension   . Osteoarthritis   . Pancreatitis   . Rheumatoid arthritis(714.0)   . Tobacco user     Past Surgical History:  Procedure Laterality Date  . COLONOSCOPY    . FRACTURE SURGERY     cheek bone fracture   . rtc     right shoulder  -- 2010  . 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   Procedure: TOTAL HIP ARTHROPLASTY ANTERIOR APPROACH;  Surgeon: Renette Butters, MD;  Location: [place];  Service: Orthopedics;  Laterality: Right;  . WRIST SURGERY     fusion with pins  2007    There were no vitals filed for this visit.  Subjective Assessment - 05/18/19 0918    Subjective  Pt reports he has the same issues with the Lt elbow.  The neck is still some sore however the elbow is worse.  Feels like something is sticking him.  He doesn't see the MD until next week.    Currently in Pain?  Yes    Pain Score   7     Pain Location  Elbow    Pain Orientation  Left    Pain Descriptors / Indicators  Sharp;Stabbing    Pain Type  Acute pain    Pain Onset  More than a month ago    Pain Frequency  Constant    Aggravating Factors   moving the elbow    Pain Relieving Factors  rest and keeping the elbow still.    Multiple Pain Sites  Yes    Pain Score  3    Pain Location  Neck    Pain Orientation  Left    Pain Descriptors / Indicators  Tightness    Pain Type  Acute pain    Pain Onset  More than a month ago    Pain Frequency  Intermittent    Aggravating Factors   turning the head    Pain Relieving Factors  heat         [facility] PT Assessment - 05/18/19 0001      Assessment   Medical Diagnosis  cervical and LT elbow pain    Referring Provider (PT)  Eunice Blase , MD    Onset Date/Surgical Date  01/14/19    Next MD Visit  next week      AROM   AROM Assessment Site  Elbow;Cervical    Right/Left Elbow  Left    Left Elbow Flexion  145    Left Elbow Extension  4    Cervical Flexion  42    Cervical Extension  50    Cervical - Right Side Bend  12    Cervical - Left Side Bend  26    Cervical - Right Rotation  50    Cervical - Left Rotation  36      Strength   Strength Assessment Site  Shoulder    Right/Left Shoulder  Left   Rt WNL   Left Shoulder Flexion  3+/5    Left Shoulder Extension  4/5    Left Shoulder ABduction  3+/5    Left Shoulder Internal Rotation  4/5    Left Shoulder External Rotation  3+/5      Palpation   Palpation comment  very tight and tender in Lt upper trap, scalenes, levator, trigger point tightness in Lt extensor brevis       Special Tests   Other special tests  (-) upper neural tightness.                    [facility] Adult PT Treatment/Exercise - 05/18/19 0001      Exercises   Exercises  Neck      Neck Exercises: Seated   Neck Retraction  15 reps    Shoulder Rolls  Backwards;15 reps    Other Seated Exercise  15 reps scapular retraction      Neck  Exercises: Supine   Neck Retraction  15 reps    Other Supine Exercise  15 reps shoulder presses      Moist Heat Therapy   Moist Heat Location  --   pt to heat at home      Manual Therapy   Manual Therapy  Soft tissue mobilization;Manual Traction;Passive ROM    Soft tissue mobilization  Lt upper trap in sitting, Lt upper trap scalenes , pecs and cerical paraspinals in supiine     Passive ROM  cervical flex, rotation and side bend    Manual Traction  cervical             PT Education - 05/18/19 0940    Education Details  HEP    Person(s) Educated  Patient    Methods  Explanation;Demonstration;Handout    Comprehension  Returned demonstration;Verbal cues required;Verbalized understanding       PT Short Term Goals - 05/18/19 0922      PT SHORT TERM GOAL #1   Title  He will demo independence with  initial HEP     Baseline  pt reports he has forgotten the exercise and lost the handout.    Time  3    Period  Weeks    Status  On-going    Target Date  06/08/19      PT SHORT TERM GOAL #2   Title  He will report pain decreased  neck pain 10-20%     Baseline  pt reports no decrease in pain however is dealing with  it a little better    Time  3    Period  Weeks    Status  On-going    Target Date  06/08/19      PT SHORT  TERM GOAL #3   Title  Patient will increase bilateral cervical rotation by 10 degrees bilateral         PT Long Term Goals - 03/02/19 1056      PT LONG TERM GOAL #1   Title  He will demo independent with all HEP issued     Baseline  independent with initial HEP    Time  8    Period  Weeks    Status  New      PT LONG TERM GOAL #2   Title  Pt will report pain in neck a sintermittant    Baseline  constant pain    Period  Weeks    Status  New      PT LONG TERM GOAL #3   Title  He will be able to correct psoture with min to no pain    Baseline  forward head posture    Time  8    Period  Weeks    Status  New      PT LONG TERM GOAL #4   Title  He  will use Lt arm for normal selfcare and home tasks with 1-2 max pain.     Baseline  constant shoulder and arm pain    Time  8    Period  Weeks    Status  New            Plan - 05/18/19 0955    Clinical Impression Statement  Mr Soler has not been in for 2 months d/t covid and not being able to attend.  He reports continued Lt elbow and neck pain.  the elbow seems to be more limiting in his function due to the sharp shooting nature of the pain.  He has improved ROM in both the neck and elbow, significant weakness in the Lt UE and alot of muscular tightness in the neck, upper shoulder and lower arm.  He sits and stands with forward posture and this palces extra strain on the tight muscles, he is able to self correct however upper back muscles fatigue.  He would benefit from continued treatment to decrease pain/muscular tightness and restore strength to allow full return to use of Lt UE and ability to sustain upright posture throughout the day    Rehab Potential  Good    PT Frequency  1x / week    PT Duration  3 weeks    PT Treatment/Interventions  Taping;Passive range of motion;Dry needling;Manual techniques;Ultrasound;Traction;Electrical Stimulation;Moist Heat;Therapeutic exercise;Therapeutic activities;Patient/family education;Neuromuscular re-education;Cryotherapy    PT Next Visit Plan  scapular stabilization, manual work to neck and Lt shoulder, possible DN    Consulted and Agree with Plan of Care  Patient       Patient will benefit from skilled therapeutic intervention in order to improve the following deficits and impairments:  Dizziness, Pain, Postural dysfunction, Increased muscle spasms, Decreased strength, Decreased range of motion, Impaired UE functional use, Decreased endurance, Improper body mechanics  Visit Diagnosis: 1. Cervicalgia   2. Pain in left elbow   3. Stiffness of left elbow, not elsewhere classified   4. Acute pain of left shoulder        Problem  List Patient Active Problem List   Diagnosis Date Noted  . Angioedema 02/09/2017  . Primary localized osteoarthritis of right hip 01/02/2016  . Groin rash 06/16/2013  . DM w/o complication type II ([place]) 06/16/2013  . BPH (benign prostatic hyperplasia) 11/24/2012  . Healthcare maintenance 11/24/2012  .  Pancreatitis 10/31/2012  . Chronic pain syndrome 09/18/2011  . 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 Right rotator cuff tear 07/26/2011  . Shoulder pain, right 05/03/2011  . Right ankle pain 01/23/2011  . Left wrist pain 01/23/2011  . Back pain 01/02/2011  . MOLLUSCUM CONTAGIOSUM 12/05/2008  . LEUKOCYTOSIS UNSPECIFIED 03/25/2008  . LIVER FUNCTION TESTS, ABNORMAL 03/25/2008  . ERECTILE DYSFUNCTION 05/12/2007  . Essential hypertension 03/02/2007  . HYPERLIPIDEMIA 01/08/2007  . ANXIETY 01/08/2007  . GERD 01/08/2007  . RHEUMATOID ARTHRITIS 01/08/2007  . OSTEOARTHRITIS 01/08/2007    Jeral Pinch PT  05/18/2019, 10:05 AM  [facility] [address] [place], [place], [address] Phone: [phone]   Fax:  [phone]  Name: CIRILO CANNER MRN: [id number] Date of Birth: [birth year]

## 2019-05-24 ENCOUNTER — Ambulatory Visit: Payer: Medicaid Other | Admitting: Family Medicine

## 2019-05-25 ENCOUNTER — Encounter: Payer: Self-pay | Admitting: Physical Therapy

## 2019-05-25 ENCOUNTER — Other Ambulatory Visit: Payer: Self-pay

## 2019-05-25 ENCOUNTER — Ambulatory Visit: Payer: Medicaid Other | Admitting: Physical Therapy

## 2019-05-25 DIAGNOSIS — M25512 Pain in left shoulder: Secondary | ICD-10-CM | POA: Diagnosis not present

## 2019-05-25 DIAGNOSIS — M25522 Pain in left elbow: Secondary | ICD-10-CM

## 2019-05-25 DIAGNOSIS — M25622 Stiffness of left elbow, not elsewhere classified: Secondary | ICD-10-CM | POA: Diagnosis not present

## 2019-05-25 DIAGNOSIS — M542 Cervicalgia: Secondary | ICD-10-CM | POA: Diagnosis not present

## 2019-05-25 NOTE — Therapy (Addendum)
[place], [place], [address] Phone: [phone]   Fax:  [phone]  Physical Therapy Treatment/Discharge  Patient Details  Name: [name] MRN: [id number] Date of Birth: [birth year] Referring Provider (PT): Eunice Blase , MD   Encounter Date: 05/25/2019  PT End of Session - 05/25/19 0915    Visit Number  4    Number of Visits  6    Date for PT Re-Evaluation  05/14/19    Authorization Type  Medicaid    Authorization Time Period  auth 3 more visit 7/23-8/12    Authorization - Visit Number  1    Authorization - Number of Visits  3    PT Start Time  0915    PT Stop Time  1006    PT Time Calculation (min)  51 min    Activity Tolerance  Patient tolerated treatment well       Past Medical History:  Diagnosis Date  . Alcohol abuse   . Allergic rhinitis   . Anxiety    With Post traumatic stress disorder  . Diabetes mellitus without complication [facility])    'sometime last yr'--type 2  . GERD (gastroesophageal reflux disease)   . Headache    "like migraines"  . Hepatitis    he thinks its hep b or c  . Hyperlipidemia   . Hypertension   . Osteoarthritis   . Pancreatitis   . Rheumatoid arthritis(714.0)   . Tobacco user     Past Surgical History:  Procedure Laterality Date  . COLONOSCOPY    . FRACTURE SURGERY     cheek bone fracture   . rtc     right shoulder  -- 2010  . 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   Procedure: TOTAL HIP ARTHROPLASTY ANTERIOR APPROACH;  Surgeon: Renette Butters, MD;  Location: [place];  Service: Orthopedics;  Laterality: Right;  . WRIST SURGERY     fusion with pins  2007    There were no vitals filed for this visit.  Subjective Assessment - 05/25/19 0915    Subjective  pt woke up yesterday with severe Rt LBP, he missed his MD appointment because he couldn't move around.  The neck if feeling ok because the low back is so sore.    Pertinent History  RT THA     Currently in Pain?  Yes    Pain Score  8     Pain Location  Neck    Pain Type  Chronic pain    Pain Onset  More than a month ago    Pain Frequency  Constant    Aggravating Factors   moving the Lt UE    Pain Relieving Factors  rest    Pain Score  9    Pain Location  Back    Pain Orientation  Lower;Right    Pain Descriptors / Indicators  Aching;Shooting    Pain Onset  More than a month ago    Pain Frequency  Intermittent    Aggravating Factors   getting up and moving aroun         [facility] PT Assessment - 05/25/19 0001      Assessment   Medical Diagnosis  cervical and LT elbow pain    Referring Provider (PT)  Eunice Blase , MD      AROM   Cervical - Right Rotation  69    Cervical - Left Rotation  40  [place] Adult PT Treatment/Exercise - 05/25/19 0001      Exercises   Exercises  Neck      Neck Exercises: Seated   Shoulder Rolls  15 reps      Neck Exercises: Supine   Neck Retraction  15 reps;5 secs   head presses   Neck Retraction Limitations  10 reps leg lentheners, LTR     Other Supine Exercise  15 reps shoulder presses      Neck Exercises: Prone   Other Prone Exercise  10 reps cat/cow, childs pose      Modalities   Modalities  Electrical Stimulation;Moist Heat      Moist Heat Therapy   Number Minutes Moist Heat  12 Minutes    Moist Heat Location  Cervical;Lumbar Spine      Electrical Stimulation   Electrical Stimulation Location  Rt lumbar, Lt cervical and upper shoulder    Electrical Stimulation Action  premod    Electrical Stimulation Parameters  to toleance    Electrical Stimulation Goals  Tone;Pain      Manual Therapy   Manual therapy comments  skilled palpation and monitoring during DN    Soft tissue mobilization  STM to Lt upper trap, pericervical muscles and Rt lumbar paraspinal        Trigger Point Dry Needling - 05/25/19 0001    Consent Given?  Yes    Muscles Treated Head and Neck  Levator scapulae;Upper trapezius    LT   Muscles Treated Back/Hip  Lumbar multifidi   Rt    Upper Trapezius Response  Palpable increased muscle length;Twitch reponse elicited   Lt   Levator Scapulae Response  Palpable increased muscle length;Twitch response elicited   Lt   Lumbar multifidi Response  Palpable increased muscle length;Twitch response elicited   Rt             PT Short Term Goals - 05/25/19 0950      PT SHORT TERM GOAL #1   Title  He will demo independence with  initial HEP     Status  On-going      PT SHORT TERM GOAL #2   Title  He will report pain decreased  neck pain 10-20%     Status  On-going      PT SHORT TERM GOAL #3   Title  Patient will increase bilateral cervical rotation by 10 degrees bilateral     Status  Partially Met      PT SHORT TERM GOAL #4   Title  demo painfree full cervical rom to assist with daily acitivity    Status  On-going        PT Long Term Goals - 03/02/19 1056      PT LONG TERM GOAL #1   Title  He will demo independent with all HEP issued     Baseline  independent with initial HEP    Time  8    Period  Weeks    Status  New      PT LONG TERM GOAL #2   Title  Pt will report pain in neck a sintermittant    Baseline  constant pain    Period  Weeks    Status  New      PT LONG TERM GOAL #3   Title  He will be able to correct psoture with min to no pain    Baseline  forward head posture    Time  8    Period  Weeks    Status  New      PT LONG TERM GOAL #4   Title  He will use Lt arm for normal selfcare and home tasks with 1-2 max pain.     Baseline  constant shoulder and arm pain    Time  8    Period  Weeks    Status  New            Plan - 05/25/19 0950    Clinical Impression Statement  [name] reports decreased pain after manual work today, had increased cervical ROM after as well, paritally meeting his rotation goal.  He still has tightness, postural changes and weakness that need to be addressed.    PT Frequency  1x / week    PT  Treatment/Interventions  Taping;Passive range of motion;Dry needling;Manual techniques;Ultrasound;Traction;Electrical Stimulation;Moist Heat;Therapeutic exercise;Therapeutic activities;Patient/family education;Neuromuscular re-education;Cryotherapy    PT Next Visit Plan  assess response to DN, add in more scapular and cervical stabilization ex.    Consulted and Agree with Plan of Care  Patient       Patient will benefit from skilled therapeutic intervention in order to improve the following deficits and impairments:  Dizziness, Pain, Postural dysfunction, Increased muscle spasms, Decreased strength, Decreased range of motion, Impaired UE functional use, Decreased endurance, Improper body mechanics  Visit Diagnosis: 1. Cervicalgia   2. Pain in left elbow   3. Stiffness of left elbow, not elsewhere classified   4. Acute pain of left shoulder        Problem List Patient Active Problem List   Diagnosis Date Noted  . Angioedema 02/09/2017  . Primary localized osteoarthritis of right hip 01/02/2016  . Groin rash 06/16/2013  . DM w/o complication type II ([place]) 06/16/2013  . BPH (benign prostatic hyperplasia) 11/24/2012  . Healthcare maintenance 11/24/2012  . Pancreatitis 10/31/2012  . Chronic pain syndrome 09/18/2011  . 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 Right rotator cuff tear 07/26/2011  . Shoulder pain, right 05/03/2011  . Right ankle pain 01/23/2011  . Left wrist pain 01/23/2011  . Back pain 01/02/2011  . MOLLUSCUM CONTAGIOSUM 12/05/2008  . LEUKOCYTOSIS UNSPECIFIED 03/25/2008  . LIVER FUNCTION TESTS, ABNORMAL 03/25/2008  . ERECTILE DYSFUNCTION 05/12/2007  . Essential hypertension 03/02/2007  . HYPERLIPIDEMIA 01/08/2007  . ANXIETY 01/08/2007  . GERD 01/08/2007  . RHEUMATOID ARTHRITIS 01/08/2007  . OSTEOARTHRITIS 01/08/2007    Jeral Pinch PT  05/25/2019, 9:57 AM  [facility] [address] [place], [place], [address] Phone:  [phone]   Fax:  [phone]  Name: [name] MRN: [id number] Date of Birth: [birth year]   PHYSICAL THERAPY DISCHARGE SUMMARY  Visits from Start of Care: 4  Current functional level related to goals / functional outcomes: unknown   Remaining deficits: unknown   Education / Equipment: HEP and DN Plan:                                                    Patient goals were not met. Patient is being discharged due to not returning since the last visit.  ?????     Jeral Pinch, PT 07/06/19 8:32 AM

## 2019-05-25 NOTE — Patient Instructions (Signed)

## 2019-05-31 ENCOUNTER — Ambulatory Visit: Payer: No Typology Code available for payment source

## 2019-06-04 ENCOUNTER — Ambulatory Visit: Payer: Medicaid Other | Admitting: Family Medicine

## 2019-06-08 ENCOUNTER — Ambulatory Visit: Payer: No Typology Code available for payment source

## 2019-06-09 DIAGNOSIS — M25552 Pain in left hip: Secondary | ICD-10-CM | POA: Diagnosis not present

## 2019-06-15 DIAGNOSIS — M542 Cervicalgia: Secondary | ICD-10-CM | POA: Diagnosis not present

## 2019-06-16 DIAGNOSIS — M25552 Pain in left hip: Secondary | ICD-10-CM | POA: Diagnosis not present

## 2019-07-07 ENCOUNTER — Encounter (HOSPITAL_COMMUNITY): Payer: Self-pay | Admitting: *Deleted

## 2019-07-07 ENCOUNTER — Encounter (HOSPITAL_COMMUNITY): Payer: Self-pay

## 2019-07-08 DIAGNOSIS — M5022 Other cervical disc displacement, mid-cervical region, unspecified level: Secondary | ICD-10-CM | POA: Diagnosis not present

## 2019-07-08 DIAGNOSIS — M5412 Radiculopathy, cervical region: Secondary | ICD-10-CM | POA: Diagnosis not present

## 2019-07-08 DIAGNOSIS — M542 Cervicalgia: Secondary | ICD-10-CM | POA: Diagnosis not present

## 2019-07-16 DIAGNOSIS — M542 Cervicalgia: Secondary | ICD-10-CM | POA: Diagnosis not present

## 2019-07-16 DIAGNOSIS — M5412 Radiculopathy, cervical region: Secondary | ICD-10-CM | POA: Diagnosis not present

## 2019-07-16 DIAGNOSIS — M5022 Other cervical disc displacement, mid-cervical region, unspecified level: Secondary | ICD-10-CM | POA: Diagnosis not present

## 2020-01-29 ENCOUNTER — Ambulatory Visit (HOSPITAL_COMMUNITY)
Admission: EM | Admit: 2020-01-29 | Discharge: 2020-01-29 | Disposition: A | Discharge status: Home / Self Care | Payer: Medicaid Other | Attending: Family Medicine | Admitting: Family Medicine

## 2020-01-29 ENCOUNTER — Ambulatory Visit (INDEPENDENT_AMBULATORY_CARE_PROVIDER_SITE_OTHER): Payer: Medicaid Other

## 2020-01-29 ENCOUNTER — Other Ambulatory Visit: Payer: Self-pay

## 2020-01-29 ENCOUNTER — Encounter (HOSPITAL_COMMUNITY): Payer: Self-pay

## 2020-01-29 DIAGNOSIS — Z7984 Long term (current) use of oral hypoglycemic drugs: Secondary | ICD-10-CM | POA: Diagnosis not present

## 2020-01-29 DIAGNOSIS — R0989 Other specified symptoms and signs involving the circulatory and respiratory systems: Secondary | ICD-10-CM | POA: Diagnosis not present

## 2020-01-29 DIAGNOSIS — G894 Chronic pain syndrome: Secondary | ICD-10-CM | POA: Diagnosis not present

## 2020-01-29 DIAGNOSIS — R5383 Other fatigue: Secondary | ICD-10-CM

## 2020-01-29 DIAGNOSIS — Z20822 Contact with and (suspected) exposure to covid-19: Secondary | ICD-10-CM | POA: Diagnosis not present

## 2020-01-29 DIAGNOSIS — R52 Pain, unspecified: Secondary | ICD-10-CM

## 2020-01-29 DIAGNOSIS — Z79899 Other long term (current) drug therapy: Secondary | ICD-10-CM | POA: Insufficient documentation

## 2020-01-29 DIAGNOSIS — Z886 Allergy status to analgesic agent status: Secondary | ICD-10-CM | POA: Insufficient documentation

## 2020-01-29 DIAGNOSIS — E119 Type 2 diabetes mellitus without complications: Secondary | ICD-10-CM | POA: Insufficient documentation

## 2020-01-29 DIAGNOSIS — R0981 Nasal congestion: Secondary | ICD-10-CM

## 2020-01-29 DIAGNOSIS — R05 Cough: Secondary | ICD-10-CM | POA: Diagnosis not present

## 2020-01-29 DIAGNOSIS — I10 Essential (primary) hypertension: Secondary | ICD-10-CM | POA: Insufficient documentation

## 2020-01-29 DIAGNOSIS — E785 Hyperlipidemia, unspecified: Secondary | ICD-10-CM | POA: Diagnosis not present

## 2020-01-29 DIAGNOSIS — Z888 Allergy status to other drugs, medicaments and biological substances status: Secondary | ICD-10-CM | POA: Insufficient documentation

## 2020-01-29 DIAGNOSIS — R059 Cough, unspecified: Secondary | ICD-10-CM

## 2020-01-29 DIAGNOSIS — F17210 Nicotine dependence, cigarettes, uncomplicated: Secondary | ICD-10-CM | POA: Insufficient documentation

## 2020-01-29 DIAGNOSIS — R519 Headache, unspecified: Secondary | ICD-10-CM | POA: Diagnosis not present

## 2020-01-29 LAB — SARS CORONAVIRUS 2 (TAT 6-24 HRS): SARS Coronavirus 2: NEGATIVE

## 2020-01-29 MED ORDER — METHYLPREDNISOLONE SODIUM SUCC 125 MG IJ SOLR
60.0000 mg | Freq: Once | INTRAMUSCULAR | Status: AC
  Administered 2020-01-29: 14:00:00 60 mg via INTRAMUSCULAR

## 2020-01-29 MED ORDER — METHYLPREDNISOLONE SODIUM SUCC 125 MG IJ SOLR
INTRAMUSCULAR | Status: AC
  Filled 2020-01-29: qty 2

## 2020-01-29 MED ORDER — BENZONATATE 100 MG PO CAPS: 100.0000 mg | ORAL_CAPSULE | Freq: Three times a day (TID) | ORAL | 0 refills | Status: DC

## 2020-01-29 NOTE — ED Triage Notes (Signed)
C/o runny nose, body aches, headache, and cough x2 days.

## 2020-01-29 NOTE — Discharge Instructions (Addendum)
Your COVID test is pending.  You should self quarantine until the test result is back.    Take Tylenol as needed for fever or discomfort.  Rest and keep yourself hydrated.    Go to the emergency department if you develop shortness of breath, severe diarrhea, high fever not relieved by Tylenol or ibuprofen, or other concerning symptoms.    Your chest xray today was negative

## 2020-02-02 NOTE — ED Provider Notes (Signed)
[place]    CSN: [id number] Arrival date & time: 01/29/20  1242      History   Chief Complaint Chief Complaint  Patient presents with  . Cough  . Nasal Congestion  . Generalized Body Aches    HPI [name] is a 64 y.o. male.   Patient reports that he has been having rhinorrhea, body aches, cough, headaches for the last 2 days.  Has made no attempt to treat this at home.  Denies sick contacts.  Patient has past medical history significant for hypertension, diabetes, hepatitis, alcohol abuse, GERD, pancreatitis, daily smoker.  Denies fever, shortness of breath, chest pain, chills, nausea, vomiting, diarrhea, rash, fever, other symptoms.  ROS per HPI   The history is provided by the patient.  Cough   Past Medical History:  Diagnosis Date  . Alcohol abuse   . Allergic rhinitis   . Anxiety    With Post traumatic stress disorder  . Diabetes mellitus without complication [facility])    'sometime last yr'--type 2  . GERD (gastroesophageal reflux disease)   . Headache    "like migraines"  . Hepatitis    he thinks its hep b or c  . Hyperlipidemia   . Hypertension   . Osteoarthritis   . Pancreatitis   . Rheumatoid arthritis(714.0)   . Tobacco user     Patient Active Problem List   Diagnosis Date Noted  . Angioedema 02/09/2017  . Primary localized osteoarthritis of right hip 01/02/2016  . Groin rash 06/16/2013  . DM w/o complication type II ([place]) 06/16/2013  . BPH (benign prostatic hyperplasia) 11/24/2012  . Healthcare maintenance 11/24/2012  . Pancreatitis 10/31/2012  . Chronic pain syndrome 09/18/2011  . 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 Right rotator cuff tear 07/26/2011  . Shoulder pain, right 05/03/2011  . Right ankle pain 01/23/2011  . Left wrist pain 01/23/2011  . Back pain 01/02/2011  . MOLLUSCUM CONTAGIOSUM 12/05/2008  . LEUKOCYTOSIS UNSPECIFIED 03/25/2008  . LIVER FUNCTION TESTS, ABNORMAL 03/25/2008  . ERECTILE DYSFUNCTION 05/12/2007  .  Essential hypertension 03/02/2007  . HYPERLIPIDEMIA 01/08/2007  . ANXIETY 01/08/2007  . GERD 01/08/2007  . RHEUMATOID ARTHRITIS 01/08/2007  . OSTEOARTHRITIS 01/08/2007    Past Surgical History:  Procedure Laterality Date  . COLONOSCOPY    . FRACTURE SURGERY     cheek bone fracture   . rtc     right shoulder  -- 2010  . 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   Procedure: TOTAL HIP ARTHROPLASTY ANTERIOR APPROACH;  Surgeon: Renette Butters, MD;  Location: [place];  Service: Orthopedics;  Laterality: Right;  . WRIST SURGERY     fusion with pins  2007       Home Medications    Prior to Admission medications   Medication Sig Start Date End Date Taking? Authorizing Provider  amLODipine (NORVASC) 5 MG tablet Take 1 tablet (5 mg total) by mouth daily. 02/10/19   Lanae Boast, FNP  benzonatate (TESSALON) 100 MG capsule Take 1 capsule (100 mg total) by mouth every 8 (eight) hours. 01/29/20   Faustino Congress, NP  EPINEPHrine 0.3 mg/0.3 mL IJ SOAJ injection Inject 0.3 mLs (0.3 mg total) into the muscle once as needed (For anyphylaxis or severe angioedema.). 02/10/17   Lenore Cordia, MD  gabapentin (NEURONTIN) 600 MG tablet Take 1 tablet (600 mg total) by mouth 3 (three) times daily. 02/10/19   Lanae Boast, FNP  gemfibrozil (LOPID) 600 MG tablet Take 1 tablet (600 mg total)  by mouth 2 (two) times daily before a meal. 02/12/17   Dorena Dew, FNP  hydrochlorothiazide (MICROZIDE) 12.5 MG capsule Take 1 capsule (12.5 mg total) by mouth daily. 02/12/17   Dorena Dew, FNP  HYDROcodone-acetaminophen (NORCO) 10-325 MG tablet Take 1 tablet by mouth 4 (four) times daily as needed for moderate pain.     [provider]  metFORMIN (GLUCOPHAGE) 500 MG tablet Take 1 tablet (500 mg total) by mouth daily with breakfast. 02/10/19   Lanae Boast, FNP  methocarbamol (ROBAXIN) 500 MG tablet Take 1 tablet (500 mg total) by mouth 2 (two) times daily. 02/14/19   Recardo Evangelist, PA-C    neomycin-polymyxin-hydrocortisone (CORTISPORIN) OTIC solution Apply 1-2 drops to toe after soaking BID 02/24/19   Regal, Tamala Fothergill, DPM  omega-3 acid ethyl esters (LOVAZA) 1 g capsule Take 1 capsule (1 g total) by mouth daily. 02/10/19   Lanae Boast, FNP  ondansetron (ZOFRAN ODT) 4 MG disintegrating tablet Take 1 tablet (4 mg total) by mouth every 8 (eight) hours as needed for nausea or vomiting. 11/18/17   Street, [place], PA-C  triamcinolone (KENALOG) 0.025 % ointment Apply 1 application topically 2 (two) times daily. 06/12/18   Lanae Boast, FNP    Family History Family History  Problem Relation Age of Onset  . Kidney disease Brother   . Angioedema Maternal Aunt        Tongue swelling    Social History Social History   Tobacco Use  . Smoking status: Current Every Day Smoker    Packs/day: 0.40    Years: 25.00    Pack years: 10.00    Types: Cigarettes  . Smokeless tobacco: Never Used  . Tobacco comment: trying  Substance Use Topics  . Alcohol use: Yes    Alcohol/week: 7.0 - 14.0 standard drinks    Types: 7 - 14 Cans of beer per week    Comment: 1-2 beers occasionally  . Drug use: No     Allergies   Lisinopril and Nsaids   Review of Systems Review of Systems  Respiratory: Positive for cough.      Physical Exam Triage Vital Signs ED Triage Vitals  Enc Vitals Group     BP 01/29/20 1316 (!) 162/84     Pulse Rate 01/29/20 1316 86     Resp 01/29/20 1316 14     Temp 01/29/20 1316 97.9 F (36.6 C)     Temp Source 01/29/20 1316 Oral     SpO2 01/29/20 1316 96 %     Weight --      Height --      Head Circumference --      Peak Flow --      Pain Score 01/29/20 1312 8     Pain Loc --      Pain Edu? --      Excl. in [place]? --    No data found.  Updated Vital Signs BP (!) 162/84 (BP Location: Right Arm)   Pulse 86   Temp 97.9 F (36.6 C) (Oral)   Resp 14   SpO2 96%   Visual Acuity Right Eye Distance:   Left Eye Distance:   Bilateral Distance:    Right  Eye Near:   Left Eye Near:    Bilateral Near:     Physical Exam Vitals and nursing note reviewed.  Constitutional:      General: He is not in acute distress.    Appearance: Normal appearance. He is well-developed and  normal weight. He is ill-appearing.  HENT:     Head: Normocephalic and atraumatic.     Right Ear: Tympanic membrane normal.     Left Ear: Tympanic membrane normal.     Nose: Rhinorrhea present. No congestion.     Mouth/Throat:     Mouth: Mucous membranes are moist.     Pharynx: Oropharynx is clear.  Eyes:     Extraocular Movements: Extraocular movements intact.     Conjunctiva/sclera: Conjunctivae normal.     Pupils: Pupils are equal, round, and reactive to light.  Cardiovascular:     Rate and Rhythm: Normal rate and regular rhythm.     Heart sounds: Normal heart sounds. No murmur.  Pulmonary:     Effort: Pulmonary effort is normal. No respiratory distress.     Breath sounds: No stridor. Wheezing present. No rhonchi or rales.  Chest:     Chest wall: No tenderness.  Abdominal:     General: Bowel sounds are normal. There is no distension.     Palpations: Abdomen is soft. There is no mass.     Tenderness: There is no abdominal tenderness. There is no guarding or rebound.     Hernia: No hernia is present.  Musculoskeletal:        General: Normal range of motion.     Cervical back: Normal range of motion and neck supple.  Skin:    General: Skin is warm and dry.  Neurological:     General: No focal deficit present.     Mental Status: He is alert and oriented to person, place, and time.  Psychiatric:        Mood and Affect: Mood normal.        Behavior: Behavior normal.        Thought Content: Thought content normal.      UC Treatments / Results  Labs (all labs ordered are listed, but only abnormal results are displayed) Labs Reviewed  SARS CORONAVIRUS 2 (TAT 6-24 HRS)    EKG   Radiology No results found.  Procedures Procedures (including critical  care time)  Medications Ordered in UC Medications  methylPREDNISolone sodium succinate (SOLU-MEDROL) 125 mg/2 mL injection 60 mg (60 mg Intramuscular Given 01/29/20 1423)    Initial Impression / Assessment and Plan / UC Course  I have reviewed the triage vital signs and the nursing notes.  Pertinent labs & imaging results that were available during my care of the patient were reviewed by me and considered in my medical decision making (see chart for details).     Suspected Covid: Cough, congestion, body aches, headache, fatigue for the last 2 days with sudden onset.  Diffuse wheezing noted bilaterally.  Chest x-ray negative today.  Covid swab obtained in office.  Patient instructed to quarantine until results are back and negative.  If Covid is negative, patient may return to schedule as usual.  If Covid results are positive, patient qualifies for infusions and will need to have this set up.  Also instructed that if Covid results are positive, that he needs to quarantine for the next 10 days.  Patient instructed to follow-up with the ER for trouble swallowing, trouble breathing, high fever, other concerning symptoms. Final Clinical Impressions(s) / UC Diagnoses   Final diagnoses:  Cough  Nasal congestion  Body aches  Nonintractable headache, unspecified chronicity pattern, unspecified headache type  Fatigue, unspecified type  Suspected COVID-19 virus infection     Discharge Instructions     Your COVID test  is pending.  You should self quarantine until the test result is back.    Take Tylenol as needed for fever or discomfort.  Rest and keep yourself hydrated.    Go to the emergency department if you develop shortness of breath, severe diarrhea, high fever not relieved by Tylenol or ibuprofen, or other concerning symptoms.    Your chest xray today was negative     ED Prescriptions    Medication Sig Dispense Auth. Provider   benzonatate (TESSALON) 100 MG capsule Take 1 capsule  (100 mg total) by mouth every 8 (eight) hours. 21 capsule Faustino Congress, NP     PDMP not reviewed this encounter.   Faustino Congress, NP 02/02/20 0021

## 2020-02-28 ENCOUNTER — Encounter (HOSPITAL_COMMUNITY): Payer: Self-pay

## 2020-02-28 ENCOUNTER — Other Ambulatory Visit: Payer: Self-pay

## 2020-02-28 ENCOUNTER — Ambulatory Visit (HOSPITAL_COMMUNITY)
Admission: EM | Admit: 2020-02-28 | Discharge: 2020-02-28 | Disposition: A | Discharge status: Home / Self Care | Payer: Medicaid Other | Attending: Physician Assistant | Admitting: Physician Assistant

## 2020-02-28 ENCOUNTER — Ambulatory Visit (INDEPENDENT_AMBULATORY_CARE_PROVIDER_SITE_OTHER): Payer: Medicaid Other

## 2020-02-28 DIAGNOSIS — M7989 Other specified soft tissue disorders: Secondary | ICD-10-CM | POA: Diagnosis not present

## 2020-02-28 DIAGNOSIS — M25571 Pain in right ankle and joints of right foot: Secondary | ICD-10-CM

## 2020-02-28 DIAGNOSIS — M79671 Pain in right foot: Secondary | ICD-10-CM | POA: Diagnosis not present

## 2020-02-28 MED ORDER — PREDNISONE 10 MG PO TABS: 20.0000 mg | ORAL_TABLET | Freq: Every day | ORAL | 0 refills | Status: AC

## 2020-02-28 MED ORDER — HYDROCODONE-ACETAMINOPHEN 5-325 MG PO TABS
1.0000 | ORAL_TABLET | Freq: Once | ORAL | Status: AC
  Administered 2020-02-28: 10:00:00 1 via ORAL

## 2020-02-28 MED ORDER — HYDROCODONE-ACETAMINOPHEN 5-325 MG PO TABS: 2.0000 | ORAL_TABLET | ORAL | 0 refills | Status: DC | PRN

## 2020-02-28 MED ORDER — ACETAMINOPHEN 500 MG PO TABS: 1000.0000 mg | ORAL_TABLET | Freq: Four times a day (QID) | ORAL | 0 refills | Status: DC | PRN

## 2020-02-28 MED ORDER — TRAMADOL HCL 50 MG PO TABS: 50.0000 mg | ORAL_TABLET | Freq: Four times a day (QID) | ORAL | 0 refills | Status: DC | PRN

## 2020-02-28 MED ORDER — HYDROCODONE-ACETAMINOPHEN 5-325 MG PO TABS
ORAL_TABLET | ORAL | Status: AC
  Filled 2020-02-28: qty 1

## 2020-02-28 NOTE — ED Provider Notes (Signed)
[place]    CSN: [id number] Arrival date & time: 02/28/20  [id number]      History   Chief Complaint Chief Complaint  Patient presents with  . Ankle Pain    HPI [name] is a 64 y.o. male.   Patient reports for right ankle pain and swelling.  He reports this is been going on for the last 4 days.  Denies any known history of injuring the ankle.  He reports he was working around the house by 4 days ago and the next day began having right ankle pain and swelling.  Reports the pain has been gradually getting worse such that is very difficult to walk or move the ankle.  He denies any history of issues with this ankle however his had similar issues with the left ankle previously.  He reports he has tried over-the-counter medicines without much improvement.  Patient does report issues with rheumatoid arthritis primary in his hands previously.  He does report a history of injuring this right ankle in the [employer] several years ago.  He reports he is most of his care at the [place].  Denies any fever or chills.  Denies pain radiating up the right leg.  [facility] reports [facility] to use these for chronic pain.  He reports he was previously prescribed up until recently due to Covid oxycodone with the [place] for chronic pain.  This is verified through [facility] review with last prescription in January 2021.  He reports there is been issues with getting to the [place] due to transportation Covid.     Past Medical History:  Diagnosis Date  . Alcohol abuse   . Allergic rhinitis   . Anxiety    With Post traumatic stress disorder  . Diabetes mellitus without complication [facility])    'sometime last yr'--type 2  . GERD (gastroesophageal reflux disease)   . Headache    "like migraines"  . Hepatitis    he thinks its hep b or c  . Hyperlipidemia   . Hypertension   . Osteoarthritis   . Pancreatitis   . Rheumatoid arthritis(714.0)   . Tobacco user     Patient  Active Problem List   Diagnosis Date Noted  . Angioedema 02/09/2017  . Primary localized osteoarthritis of right hip 01/02/2016  . Groin rash 06/16/2013  . DM w/o complication type II ([place]) 06/16/2013  . BPH (benign prostatic hyperplasia) 11/24/2012  . Healthcare maintenance 11/24/2012  . Pancreatitis 10/31/2012  . Chronic pain syndrome 09/18/2011  . 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 Right rotator cuff tear 07/26/2011  . Shoulder pain, right 05/03/2011  . Right ankle pain 01/23/2011  . Left wrist pain 01/23/2011  . Back pain 01/02/2011  . MOLLUSCUM CONTAGIOSUM 12/05/2008  . LEUKOCYTOSIS UNSPECIFIED 03/25/2008  . LIVER FUNCTION TESTS, ABNORMAL 03/25/2008  . ERECTILE DYSFUNCTION 05/12/2007  . Essential hypertension 03/02/2007  . HYPERLIPIDEMIA 01/08/2007  . ANXIETY 01/08/2007  . GERD 01/08/2007  . RHEUMATOID ARTHRITIS 01/08/2007  . OSTEOARTHRITIS 01/08/2007    Past Surgical History:  Procedure Laterality Date  . COLONOSCOPY    . FRACTURE SURGERY     cheek bone fracture   . rtc     right shoulder  -- 2010  . 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   Procedure: TOTAL HIP ARTHROPLASTY ANTERIOR APPROACH;  Surgeon: Renette Butters, MD;  Location: [place];  Service: Orthopedics;  Laterality: Right;  . WRIST SURGERY     fusion  with pins  2007       Home Medications    Prior to Admission medications   Medication Sig Start Date End Date Taking? Authorizing Provider  acetaminophen (TYLENOL) 500 MG tablet Take 2 tablets (1,000 mg total) by mouth every 6 (six) hours as needed for moderate pain. 02/28/20   Catheryn Slifer, Marguerita Beards, PA-C  amLODipine (NORVASC) 5 MG tablet Take 1 tablet (5 mg total) by mouth daily. 02/10/19   Lanae Boast, FNP  benzonatate (TESSALON) 100 MG capsule Take 1 capsule (100 mg total) by mouth every 8 (eight) hours. 01/29/20   Faustino Congress, NP  EPINEPHrine 0.3 mg/0.3 mL IJ SOAJ injection Inject 0.3 mLs (0.3 mg total) into the muscle once as needed (For anyphylaxis or  severe angioedema.). 02/10/17   Lenore Cordia, MD  gabapentin (NEURONTIN) 600 MG tablet Take 1 tablet (600 mg total) by mouth 3 (three) times daily. 02/10/19   Lanae Boast, FNP  gemfibrozil (LOPID) 600 MG tablet Take 1 tablet (600 mg total) by mouth 2 (two) times daily before a meal. 02/12/17   Dorena Dew, FNP  hydrochlorothiazide (MICROZIDE) 12.5 MG capsule Take 1 capsule (12.5 mg total) by mouth daily. 02/12/17   Dorena Dew, FNP  HYDROcodone-acetaminophen (NORCO/VICODIN) 5-325 MG tablet Take 2 tablets by mouth every 4 (four) hours as needed. 02/28/20   Kaileb Monsanto, Marguerita Beards, PA-C  metFORMIN (GLUCOPHAGE) 500 MG tablet Take 1 tablet (500 mg total) by mouth daily with breakfast. 02/10/19   Lanae Boast, FNP  methocarbamol (ROBAXIN) 500 MG tablet Take 1 tablet (500 mg total) by mouth 2 (two) times daily. 02/14/19   Recardo Evangelist, PA-C  neomycin-polymyxin-hydrocortisone (CORTISPORIN) OTIC solution Apply 1-2 drops to toe after soaking BID 02/24/19   Regal, Tamala Fothergill, DPM  omega-3 acid ethyl esters (LOVAZA) 1 g capsule Take 1 capsule (1 g total) by mouth daily. 02/10/19   Lanae Boast, FNP  ondansetron (ZOFRAN ODT) 4 MG disintegrating tablet Take 1 tablet (4 mg total) by mouth every 8 (eight) hours as needed for nausea or vomiting. 11/18/17   Street, [place], PA-C  predniSONE (DELTASONE) 10 MG tablet Take 2 tablets (20 mg total) by mouth daily for 5 days. 02/28/20 03/04/20  Carrieanne Kleen, Marguerita Beards, PA-C  triamcinolone (KENALOG) 0.025 % ointment Apply 1 application topically 2 (two) times daily. 06/12/18   Lanae Boast, FNP    Family History Family History  Problem Relation Age of Onset  . Kidney disease Brother   . Angioedema Maternal Aunt        Tongue swelling    Social History Social History   Tobacco Use  . Smoking status: Current Every Day Smoker    Packs/day: 0.40    Years: 25.00    Pack years: 10.00    Types: Cigarettes  . Smokeless tobacco: Never Used  . Tobacco comment: trying    Substance Use Topics  . Alcohol use: Yes    Alcohol/week: 7.0 - 14.0 standard drinks    Types: 7 - 14 Cans of beer per week    Comment: 1-2 beers occasionally  . Drug use: No     Allergies   Lisinopril and Nsaids   Review of Systems Review of Systems   Physical Exam Triage Vital Signs ED Triage Vitals  Enc Vitals Group     BP 02/28/20 0845 (!) 196/103     Pulse Rate 02/28/20 0845 75     Resp 02/28/20 0845 17     Temp 02/28/20 0845 98 F (  36.7 C)     Temp Source 02/28/20 0845 Oral     SpO2 02/28/20 0845 96 %     Weight --      Height --      Head Circumference --      Peak Flow --      Pain Score 02/28/20 0844 8     Pain Loc --      Pain Edu? --      Excl. in [place]? --    No data found.  Updated Vital Signs BP (!) 196/103 (BP Location: Left Arm)   Pulse 75   Temp 98 F (36.7 C) (Oral)   Resp 17   SpO2 96%   Visual Acuity Right Eye Distance:   Left Eye Distance:   Bilateral Distance:    Right Eye Near:   Left Eye Near:    Bilateral Near:     Physical Exam Vitals and nursing note reviewed.  Constitutional:      General: He is not in acute distress.    Appearance: He is well-developed.  HENT:     Head: Normocephalic and atraumatic.  Eyes:     Conjunctiva/sclera: Conjunctivae normal.  Cardiovascular:     Rate and Rhythm: Normal rate and regular rhythm.     Heart sounds: No murmur.  Pulmonary:     Effort: Pulmonary effort is normal. No respiratory distress.     Breath sounds: Normal breath sounds.  Abdominal:     Palpations: Abdomen is soft.     Tenderness: There is no abdominal tenderness.  Musculoskeletal:     Cervical back: Neck supple.     Comments: Right ankle is swollen and slight erythema over the ankle joint.  Very tender to touch over the lateral and medial malleoli.  Pain elicited with range of motion of the joint.  Patient is unable to bear weight currently on the right ankle.  There is some pain elicited with squeeze test of the  tib-fib.  Cap refill less than 2 seconds.  There is a callus on the plantar surface of the right foot.  No evidence of infection.  Skin:    General: Skin is warm and dry.  Neurological:     Mental Status: He is alert.      UC Treatments / Results  Labs (all labs ordered are listed, but only abnormal results are displayed) Labs Reviewed - No data to display  EKG   Radiology DG Foot Complete Right  Result Date: 02/28/2020 CLINICAL DATA:  Pain and swelling EXAM: RIGHT FOOT COMPLETE - 3+ VIEW COMPARISON:  None. FINDINGS: Frontal, oblique, and lateral views were obtained. No fracture or dislocation. Joint spaces overall appear unremarkable. No erosive change. There is calcification in the distal Achilles tendon. Calcification noted in the distal posterior tibial artery. IMPRESSION: Calcification in the distal Achilles tendon region. Tendinosis in this area felt to be present. No appreciable joint space narrowing or erosion. No fracture or dislocation. Atherosclerotic calcification noted in distal posterior tibial artery. Electronically Signed   By: Lowella Grip III M.D.   On: 02/28/2020 09:53    Procedures Procedures (including critical care time)  Medications Ordered in UC Medications  HYDROcodone-acetaminophen (NORCO/VICODIN) 5-325 MG per tablet 1 tablet (1 tablet Oral Given 02/28/20 1020)    Initial Impression / Assessment and Plan / UC Course  I have reviewed the triage vital signs and the nursing notes.  Pertinent labs & imaging results that were available during my care of the patient  were reviewed by me and considered in my medical decision making (see chart for details).     #Acute right ankle pain Patient is a 64 year old gentleman past medical history of diabetes, chronic pain and rheumatoid arthritis presenting with 4 days of worsening right ankle pain.  X-ray negative for fracture.  Differential would include soft tissue injury patient does not remember vs  inflammatory to include rheumatoid or gout.  Doubt septic joint at this point.  Patient does have a remote history of misuse and mishandling of narcotic medicines however on PDMP review appears he has been receiving regular narcotic prescriptions from the [place] up until January 2021.  Given intolerance of nonsteroidal anti-inflammatories and failure of Tylenol, I do believe he would benefit from very short course of Norco while prednisone anti-inflammatory begins to work.  Patient given strict return and follow-up precautions.  He is to follow-up with the [facility] for continued management of his chronic pains. -Patient discharged with prescription for 6 Norco tablets. Final Clinical Impressions(s) / UC Diagnoses   Final diagnoses:  Acute right ankle pain     Discharge Instructions     Wear the boot and use the crutches, gradually increasing weight bearing ability  Take the prednisone for 5 days, 2 tablets. Your sugar may raise slightly Take tylenol otherwise for pain  Please schedule follow up with the [facility] for further discussion of management of your chronic pain and ankle pain  If you noticed worsening swelling, fever, chills or severe pain, return for re-evalaution       ED Prescriptions    Medication Sig Dispense Auth. Provider   traMADol (ULTRAM) 50 MG tablet  (Status: Discontinued) Take 1 tablet (50 mg total) by mouth every 6 (six) hours as needed. 15 tablet Zoie Sarin, Marguerita Beards, PA-C   acetaminophen (TYLENOL) 500 MG tablet Take 2 tablets (1,000 mg total) by mouth every 6 (six) hours as needed for moderate pain. 30 tablet Calandria Mullings, Marguerita Beards, PA-C   predniSONE (DELTASONE) 10 MG tablet Take 2 tablets (20 mg total) by mouth daily for 5 days. 10 tablet Caniyah Murley, Marguerita Beards, PA-C   HYDROcodone-acetaminophen (NORCO/VICODIN) 5-325 MG tablet Take 2 tablets by mouth every 4 (four) hours as needed. 6 tablet Rito Lecomte, Marguerita Beards, PA-C     I have reviewed the PDMP during this encounter.   Purnell Shoemaker, PA-C 02/28/20  2114

## 2020-02-28 NOTE — ED Triage Notes (Signed)
Pt reports he woke up 4 days ago with right ankle pain and swelling, not know trauma. Pt reports he can not put weigh on.

## 2020-02-28 NOTE — Discharge Instructions (Addendum)
Wear the boot and use the crutches, gradually increasing weight bearing ability  Take the prednisone for 5 days, 2 tablets. Your sugar may raise slightly Take tylenol otherwise for pain  Please schedule follow up with the [facility] for further discussion of management of your chronic pain and ankle pain  If you noticed worsening swelling, fever, chills or severe pain, return for re-evalaution

## 2020-08-01 ENCOUNTER — Ambulatory Visit
Admission: EM | Admit: 2020-08-01 | Discharge: 2020-08-01 | Disposition: A | Discharge status: Home / Self Care | Payer: Medicaid Other | Attending: Emergency Medicine | Admitting: Emergency Medicine

## 2020-08-01 ENCOUNTER — Other Ambulatory Visit: Payer: Self-pay

## 2020-08-01 DIAGNOSIS — Z23 Encounter for immunization: Secondary | ICD-10-CM | POA: Diagnosis not present

## 2020-08-01 DIAGNOSIS — S81812A Laceration without foreign body, left lower leg, initial encounter: Secondary | ICD-10-CM

## 2020-08-01 DIAGNOSIS — S8992XA Unspecified injury of left lower leg, initial encounter: Secondary | ICD-10-CM | POA: Diagnosis not present

## 2020-08-01 MED ORDER — TETANUS-DIPHTH-ACELL PERTUSSIS 5-2.5-18.5 LF-MCG/0.5 IM SUSP
0.5000 mL | Freq: Once | INTRAMUSCULAR | Status: AC
  Administered 2020-08-01: 0.5 mL via INTRAMUSCULAR

## 2020-08-01 MED ORDER — AMOXICILLIN-POT CLAVULANATE 875-125 MG PO TABS: 1.0000 | ORAL_TABLET | Freq: Two times a day (BID) | ORAL | 0 refills | Status: AC

## 2020-08-01 NOTE — ED Provider Notes (Signed)
[facility]    CSN: [id number] Arrival date & time: 08/01/20  1227      History   Chief Complaint Chief Complaint  Patient presents with  . Leg Injury    HPI [name] is a 64 y.o. male  With history as outlined below presenting for left leg skin tear/pain and swelling.  States he was trying to run away from his neighbors dog when he bumped his shin into the porch.  Last tetanus unknown.  Denies dog bite or scratch.  No head trauma, LOC or anticoagulant/blood thinner use.  States he is able to clean wound at home and used hydrogen peroxide.  Past Medical History:  Diagnosis Date  . Alcohol abuse   . Allergic rhinitis   . Anxiety    With Post traumatic stress disorder  . Diabetes mellitus without complication [facility])    'sometime last yr'--type 2  . GERD (gastroesophageal reflux disease)   . Headache    "like migraines"  . Hepatitis    he thinks its hep b or c  . Hyperlipidemia   . Hypertension   . Osteoarthritis   . Pancreatitis   . Rheumatoid arthritis(714.0)   . Tobacco user     Patient Active Problem List   Diagnosis Date Noted  . Angioedema 02/09/2017  . Primary localized osteoarthritis of right hip 01/02/2016  . Groin rash 06/16/2013  . DM w/o complication type II ([place]) 06/16/2013  . BPH (benign prostatic hyperplasia) 11/24/2012  . Healthcare maintenance 11/24/2012  . Pancreatitis 10/31/2012  . Chronic pain syndrome 09/18/2011  . 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 Right rotator cuff tear 07/26/2011  . Shoulder pain, right 05/03/2011  . Right ankle pain 01/23/2011  . Left wrist pain 01/23/2011  . Back pain 01/02/2011  . MOLLUSCUM CONTAGIOSUM 12/05/2008  . LEUKOCYTOSIS UNSPECIFIED 03/25/2008  . LIVER FUNCTION TESTS, ABNORMAL 03/25/2008  . ERECTILE DYSFUNCTION 05/12/2007  . Essential hypertension 03/02/2007  . HYPERLIPIDEMIA 01/08/2007  . ANXIETY 01/08/2007  . GERD 01/08/2007  . RHEUMATOID ARTHRITIS 01/08/2007  . OSTEOARTHRITIS  01/08/2007    Past Surgical History:  Procedure Laterality Date  . COLONOSCOPY    . FRACTURE SURGERY     cheek bone fracture   . rtc     right shoulder  -- 2010  . 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   Procedure: TOTAL HIP ARTHROPLASTY ANTERIOR APPROACH;  Surgeon: Renette Butters, MD;  Location: [place];  Service: Orthopedics;  Laterality: Right;  . WRIST SURGERY     fusion with pins  2007       Home Medications    Prior to Admission medications   Medication Sig Start Date End Date Taking? Authorizing Provider  acetaminophen (TYLENOL) 500 MG tablet Take 2 tablets (1,000 mg total) by mouth every 6 (six) hours as needed for moderate pain. 02/28/20   Darr, Marguerita Beards, PA-C  amLODipine (NORVASC) 5 MG tablet Take 1 tablet (5 mg total) by mouth daily. 02/10/19   Lanae Boast, FNP  amoxicillin-clavulanate (AUGMENTIN) 875-125 MG tablet Take 1 tablet by mouth every 12 (twelve) hours for 5 days. 08/01/20 08/06/20  Hall-Potvin, [place], PA-C  benzonatate (TESSALON) 100 MG capsule Take 1 capsule (100 mg total) by mouth every 8 (eight) hours. 01/29/20   Faustino Congress, NP  EPINEPHrine 0.3 mg/0.3 mL IJ SOAJ injection Inject 0.3 mLs (0.3 mg total) into the muscle once as needed (For anyphylaxis or severe angioedema.). 02/10/17   Lenore Cordia, MD  gabapentin (NEURONTIN) 600 MG  tablet Take 1 tablet (600 mg total) by mouth 3 (three) times daily. 02/10/19   Lanae Boast, FNP  gemfibrozil (LOPID) 600 MG tablet Take 1 tablet (600 mg total) by mouth 2 (two) times daily before a meal. 02/12/17   Dorena Dew, FNP  hydrochlorothiazide (MICROZIDE) 12.5 MG capsule Take 1 capsule (12.5 mg total) by mouth daily. 02/12/17   Dorena Dew, FNP  HYDROcodone-acetaminophen (NORCO/VICODIN) 5-325 MG tablet Take 2 tablets by mouth every 4 (four) hours as needed. 02/28/20   Darr, Marguerita Beards, PA-C  metFORMIN (GLUCOPHAGE) 500 MG tablet Take 1 tablet (500 mg total) by mouth daily with breakfast. 02/10/19   Lanae Boast, FNP  methocarbamol (ROBAXIN) 500 MG tablet Take 1 tablet (500 mg total) by mouth 2 (two) times daily. 02/14/19   Recardo Evangelist, PA-C  neomycin-polymyxin-hydrocortisone (CORTISPORIN) OTIC solution Apply 1-2 drops to toe after soaking BID 02/24/19   Regal, Tamala Fothergill, DPM  omega-3 acid ethyl esters (LOVAZA) 1 g capsule Take 1 capsule (1 g total) by mouth daily. 02/10/19   Lanae Boast, FNP  ondansetron (ZOFRAN ODT) 4 MG disintegrating tablet Take 1 tablet (4 mg total) by mouth every 8 (eight) hours as needed for nausea or vomiting. 11/18/17   Street, [place], PA-C  triamcinolone (KENALOG) 0.025 % ointment Apply 1 application topically 2 (two) times daily. 06/12/18   Lanae Boast, FNP    Family History Family History  Problem Relation Age of Onset  . Kidney disease Brother   . Angioedema Maternal Aunt        Tongue swelling  . Heart attack Mother   . Heart attack Father     Social History Social History   Tobacco Use  . Smoking status: Current Every Day Smoker    Packs/day: 0.40    Years: 25.00    Pack years: 10.00    Types: Cigarettes  . Smokeless tobacco: Never Used  . Tobacco comment: trying  Vaping Use  . Vaping Use: Never used  Substance Use Topics  . Alcohol use: Yes    Alcohol/week: 7.0 - 14.0 standard drinks    Types: 7 - 14 Cans of beer per week    Comment: 1-2 beers occasionally  . Drug use: No     Allergies   Lisinopril and Nsaids   Review of Systems As per HPI   Physical Exam Triage Vital Signs ED Triage Vitals [08/01/20 1245]  Enc Vitals Group     BP (!) 138/107     Pulse Rate (!) 102     Resp 18     Temp 98 F (36.7 C)     Temp src      SpO2 96 %     Weight      Height      Head Circumference      Peak Flow      Pain Score 8     Pain Loc      Pain Edu?      Excl. in [place]?    No data found.  Updated Vital Signs BP (!) 138/107   Pulse (!) 102   Temp 98 F (36.7 C)   Resp 18   SpO2 96%   Visual Acuity Right Eye Distance:    Left Eye Distance:   Bilateral Distance:    Right Eye Near:   Left Eye Near:    Bilateral Near:     Physical Exam Constitutional:      General: He is not in  acute distress. HENT:     Head: Normocephalic and atraumatic.  Eyes:     General: No scleral icterus.    Pupils: Pupils are equal, round, and reactive to light.  Cardiovascular:     Rate and Rhythm: Normal rate.  Pulmonary:     Effort: Pulmonary effort is normal. No respiratory distress.     Breath sounds: No wheezing.  Musculoskeletal:     Comments: Left leg with superficial abrasion.  Surrounding edema and tenderness.  No bony deformity.  Skin:    Coloration: Skin is not jaundiced or pale.     Comments: 3 cm superficial skin tear without contamination.  Neurological:     Mental Status: He is alert and oriented to person, place, and time.      UC Treatments / Results  Labs (all labs ordered are listed, but only abnormal results are displayed) Labs Reviewed - No data to display  EKG   Radiology No results found.  Procedures Procedures (including critical care time)  Medications Ordered in UC Medications  Tdap (BOOSTRIX) injection 0.5 mL (has no administration in time range)    Initial Impression / Assessment and Plan / UC Course  I have reviewed the triage vital signs and the nursing notes.  Pertinent labs & imaging results that were available during my care of the patient were reviewed by me and considered in my medical decision making (see chart for details).     Do not have x-ray on site: Offered outpatient imaging-patient declined.  Will return here for x-ray if still having pain, swelling.  Has crutches at home, will use this to ambulate as needed.  Wound cleaned, dressed in office.  Tetanus updated.  Given medical history will provide short-term antibiotics.  Return precautions discussed, pt verbalized understanding and is agreeable to plan. Final Clinical Impressions(s) / UC Diagnoses   Final  diagnoses:  Injury of left lower extremity, initial encounter  Noninfected skin tear of left lower extremity, initial encounter     Discharge Instructions     RICE: rest, ice, compression, elevation as needed for pain.    Pain medication:  350 mg-1000 mg of Tylenol (acetaminophen) and/or 200 mg - 800 mg of Advil (ibuprofen, Motrin) every 8 hours as needed.  May alternate between the two throughout the day as they are generally safe to take together.  DO NOT exceed more than 3000 mg of Tylenol or 3200 mg of ibuprofen in a 24 hour period as this could damage your stomach, kidneys, liver, or increase your bleeding risk.    ED Prescriptions    Medication Sig Dispense Auth. Provider   amoxicillin-clavulanate (AUGMENTIN) 875-125 MG tablet Take 1 tablet by mouth every 12 (twelve) hours for 5 days. 10 tablet Hall-Potvin, [place], PA-C     I have reviewed the PDMP during this encounter.   Hall-Potvin, [place], [place] 08/01/20 1308

## 2020-08-01 NOTE — ED Triage Notes (Addendum)
Pt presents with leg injury that occurred today. Pt states he was running from a dog and went to jump up on a porch and hit his left leg on the porch. Pt has a skin tear on his left shin. Bleeding controlled. Pt unsure when he had a tetanus completed. The area is swollen and painful to touch.

## 2020-08-01 NOTE — Discharge Instructions (Signed)
RICE: rest, ice, compression, elevation as needed for pain.    Pain medication:  350 mg-1000 mg of Tylenol (acetaminophen) and/or 200 mg - 800 mg of Advil (ibuprofen, Motrin) every 8 hours as needed.  May alternate between the two throughout the day as they are generally safe to take together.  DO NOT exceed more than 3000 mg of Tylenol or 3200 mg of ibuprofen in a 24 hour period as this could damage your stomach, kidneys, liver, or increase your bleeding risk. 

## 2020-08-02 ENCOUNTER — Ambulatory Visit: Payer: Self-pay

## 2020-08-02 NOTE — Telephone Encounter (Signed)
Attempted to call patient. He just called with questions on how to cleanse a wound. Left VM for him to call back at [phone].

## 2020-08-02 NOTE — Telephone Encounter (Signed)
Patient clled after being seen in urgent car for skin tear. He states that they sent him home and told him to change the dressing today. He wanted instruction. He was told to pat wound with warm soapy cloth rinse and pat dry. He has antibiotic ointment that he will apply and cover and dress. He was told to keep wound clean and any sign of infection should be seen in UC . He verbalized understanding of all instruction. Reason for Disposition  General information question, no triage required and triager able to answer question  Answer Assessment - Initial Assessment Questions 1. REASON FOR CALL or QUESTION: "What is your reason for calling today?" or "How can I best help you?" or "What question do you have that I can help answer?"    Skin tear to left lower leg How should I care for it  Protocols used: [place]

## 2020-08-04 ENCOUNTER — Encounter: Payer: Self-pay | Admitting: Emergency Medicine

## 2020-08-04 ENCOUNTER — Other Ambulatory Visit: Payer: Self-pay

## 2020-08-04 ENCOUNTER — Ambulatory Visit
Admission: EM | Admit: 2020-08-04 | Discharge: 2020-08-04 | Disposition: A | Discharge status: Home / Self Care | Payer: Medicaid Other

## 2020-08-04 DIAGNOSIS — R0789 Other chest pain: Secondary | ICD-10-CM | POA: Diagnosis not present

## 2020-08-04 DIAGNOSIS — Z5189 Encounter for other specified aftercare: Secondary | ICD-10-CM | POA: Diagnosis not present

## 2020-08-04 NOTE — ED Triage Notes (Signed)
Pt states that he needed his wound redressed. He was also having some chest discomfort. Vitals are stable and patient is aox4 at this time.

## 2020-08-04 NOTE — ED Provider Notes (Signed)
[facility]    CSN: [id number] Arrival date & time: 08/04/20  1800      History   Chief Complaint Chief Complaint  Patient presents with  . Dressing Change    HPI [name] is a 64 y.o. male  Presenting for wound check.  Patient previously evaluated by me on 10/5 for left lower leg injury.  No skin to approximate.  Reports compliance with Augmentin: Tolerating this well.  No fever, thrashes, myalgias.  Changing dressings once daily. Patient also notes chronic history of intermittent, paroxysmal chest pain.  States is more towards his left shoulder, nonradiating.  Patient denies palpitations, difficulty breathing, increased dyspnea, lower leg swelling, lightheadedness or dizziness.  States that they are sharp, very brief, and without known exacerbating or alleviating factors.  Has not tried thing for this.  Has not had a routine checkup "in some time ".  Past Medical History:  Diagnosis Date  . Alcohol abuse   . Allergic rhinitis   . Anxiety    With Post traumatic stress disorder  . Diabetes mellitus without complication [facility])    'sometime last yr'--type 2  . GERD (gastroesophageal reflux disease)   . Headache    "like migraines"  . Hepatitis    he thinks its hep b or c  . Hyperlipidemia   . Hypertension   . Osteoarthritis   . Pancreatitis   . Rheumatoid arthritis(714.0)   . Tobacco user     Patient Active Problem List   Diagnosis Date Noted  . Angioedema 02/09/2017  . Primary localized osteoarthritis of right hip 01/02/2016  . Groin rash 06/16/2013  . DM w/o complication type II ([place]) 06/16/2013  . BPH (benign prostatic hyperplasia) 11/24/2012  . Healthcare maintenance 11/24/2012  . Pancreatitis 10/31/2012  . Chronic pain syndrome 09/18/2011  . 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 Right rotator cuff tear 07/26/2011  . Shoulder pain, right 05/03/2011  . Right ankle pain 01/23/2011  . Left wrist pain 01/23/2011  . Back pain 01/02/2011  . MOLLUSCUM  CONTAGIOSUM 12/05/2008  . LEUKOCYTOSIS UNSPECIFIED 03/25/2008  . LIVER FUNCTION TESTS, ABNORMAL 03/25/2008  . ERECTILE DYSFUNCTION 05/12/2007  . Essential hypertension 03/02/2007  . HYPERLIPIDEMIA 01/08/2007  . ANXIETY 01/08/2007  . GERD 01/08/2007  . RHEUMATOID ARTHRITIS 01/08/2007  . OSTEOARTHRITIS 01/08/2007    Past Surgical History:  Procedure Laterality Date  . COLONOSCOPY    . FRACTURE SURGERY     cheek bone fracture   . rtc     right shoulder  -- 2010  . 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   Procedure: TOTAL HIP ARTHROPLASTY ANTERIOR APPROACH;  Surgeon: Renette Butters, MD;  Location: [place];  Service: Orthopedics;  Laterality: Right;  . WRIST SURGERY     fusion with pins  2007       Home Medications    Prior to Admission medications   Medication Sig Start Date End Date Taking? Authorizing Provider  acetaminophen (TYLENOL) 500 MG tablet Take 2 tablets (1,000 mg total) by mouth every 6 (six) hours as needed for moderate pain. 02/28/20   Darr, Marguerita Beards, PA-C  amLODipine (NORVASC) 5 MG tablet Take 1 tablet (5 mg total) by mouth daily. 02/10/19   Lanae Boast, FNP  amoxicillin-clavulanate (AUGMENTIN) 875-125 MG tablet Take 1 tablet by mouth every 12 (twelve) hours for 5 days. 08/01/20 08/06/20  Hall-Potvin, [place], PA-C  benzonatate (TESSALON) 100 MG capsule Take 1 capsule (100 mg total) by mouth every 8 (eight) hours. 01/29/20  Faustino Congress, NP  EPINEPHrine 0.3 mg/0.3 mL IJ SOAJ injection Inject 0.3 mLs (0.3 mg total) into the muscle once as needed (For anyphylaxis or severe angioedema.). 02/10/17   Lenore Cordia, MD  gabapentin (NEURONTIN) 600 MG tablet Take 1 tablet (600 mg total) by mouth 3 (three) times daily. 02/10/19   Lanae Boast, FNP  gemfibrozil (LOPID) 600 MG tablet Take 1 tablet (600 mg total) by mouth 2 (two) times daily before a meal. 02/12/17   Dorena Dew, FNP  hydrochlorothiazide (MICROZIDE) 12.5 MG capsule Take 1 capsule (12.5 mg total) by  mouth daily. 02/12/17   Dorena Dew, FNP  HYDROcodone-acetaminophen (NORCO/VICODIN) 5-325 MG tablet Take 2 tablets by mouth every 4 (four) hours as needed. 02/28/20   Darr, Marguerita Beards, PA-C  metFORMIN (GLUCOPHAGE) 500 MG tablet Take 1 tablet (500 mg total) by mouth daily with breakfast. 02/10/19   Lanae Boast, FNP  methocarbamol (ROBAXIN) 500 MG tablet Take 1 tablet (500 mg total) by mouth 2 (two) times daily. 02/14/19   Recardo Evangelist, PA-C  neomycin-polymyxin-hydrocortisone (CORTISPORIN) OTIC solution Apply 1-2 drops to toe after soaking BID 02/24/19   Regal, Tamala Fothergill, DPM  omega-3 acid ethyl esters (LOVAZA) 1 g capsule Take 1 capsule (1 g total) by mouth daily. 02/10/19   Lanae Boast, FNP  ondansetron (ZOFRAN ODT) 4 MG disintegrating tablet Take 1 tablet (4 mg total) by mouth every 8 (eight) hours as needed for nausea or vomiting. 11/18/17   Street, [place], PA-C  triamcinolone (KENALOG) 0.025 % ointment Apply 1 application topically 2 (two) times daily. 06/12/18   Lanae Boast, FNP    Family History Family History  Problem Relation Age of Onset  . Kidney disease Brother   . Angioedema Maternal Aunt        Tongue swelling  . Heart attack Mother   . Heart attack Father     Social History Social History   Tobacco Use  . Smoking status: Current Every Day Smoker    Packs/day: 0.40    Years: 25.00    Pack years: 10.00    Types: Cigarettes  . Smokeless tobacco: Never Used  . Tobacco comment: trying  Vaping Use  . Vaping Use: Never used  Substance Use Topics  . Alcohol use: Yes    Alcohol/week: 7.0 - 14.0 standard drinks    Types: 7 - 14 Cans of beer per week    Comment: 1-2 beers occasionally  . Drug use: No     Allergies   Lisinopril and Nsaids   Review of Systems As per HPI   Physical Exam Triage Vital Signs ED Triage Vitals  Enc Vitals Group     BP 08/04/20 1812 (!) 166/89     Pulse Rate 08/04/20 1812 79     Resp 08/04/20 1812 16     Temp 08/04/20 1812  98.4 F (36.9 C)     Temp Source 08/04/20 1812 Oral     SpO2 08/04/20 1812 95 %     Weight --      Height --      Head Circumference --      Peak Flow --      Pain Score 08/04/20 1822 6     Pain Loc --      Pain Edu? --      Excl. in [place]? --    No data found.  Updated Vital Signs BP (!) 166/89 (BP Location: Left Arm)   Pulse 79   Temp  98.4 F (36.9 C) (Oral)   Resp 16   SpO2 95%   Visual Acuity Right Eye Distance:   Left Eye Distance:   Bilateral Distance:    Right Eye Near:   Left Eye Near:    Bilateral Near:     Physical Exam Constitutional:      General: He is not in acute distress. HENT:     Head: Normocephalic and atraumatic.  Eyes:     General: No scleral icterus.    Pupils: Pupils are equal, round, and reactive to light.  Cardiovascular:     Rate and Rhythm: Normal rate.  Pulmonary:     Effort: Pulmonary effort is normal. No respiratory distress.     Breath sounds: No wheezing.  Skin:    Coloration: Skin is not jaundiced or pale.     Comments: Left anterior shin with 2 cm open wound that is healing well.  Adequate granulation tissue.  Swelling has significantly improved.  No bruising.  Neurovascular intact.  No discharge  Neurological:     Mental Status: He is alert and oriented to person, place, and time.      UC Treatments / Results  Labs (all labs ordered are listed, but only abnormal results are displayed) Labs Reviewed - No data to display  EKG   Radiology No results found.  Procedures Procedures (including critical care time)  Medications Ordered in UC Medications - No data to display  Initial Impression / Assessment and Plan / UC Course  I have reviewed the triage vital signs and the nursing notes.  Pertinent labs & imaging results that were available during my care of the patient were reviewed by me and considered in my medical decision making (see chart for details).     Wound healing well: No indication for further  intervention.  Chest pain brief, paroxysmal, not exertional.  Cardiopulmonary exam reassuring at this time.  Low concern for acute process at this time.  Stressed importance of continuity of care with PCP.  Return precautions discussed, pt verbalized understanding and is agreeable to plan. Final Clinical Impressions(s) / UC Diagnoses   Final diagnoses:  Visit for wound check  Other chest pain     Discharge Instructions     Important to call and schedule appointment with PCP for check up    ED Prescriptions    None     PDMP not reviewed this encounter.   Hall-Potvin, [place], [place] 08/04/20 1826

## 2020-08-04 NOTE — Discharge Instructions (Signed)
Important to call and schedule appointment with PCP for check up

## 2020-09-11 ENCOUNTER — Encounter (HOSPITAL_COMMUNITY): Payer: Self-pay

## 2020-09-11 ENCOUNTER — Emergency Department (HOSPITAL_COMMUNITY): Payer: Medicaid Other

## 2020-09-11 ENCOUNTER — Other Ambulatory Visit: Payer: Self-pay

## 2020-09-11 ENCOUNTER — Emergency Department (HOSPITAL_COMMUNITY)
Admission: EM | Admit: 2020-09-11 | Discharge: 2020-09-11 | Disposition: A | Discharge status: Home / Self Care | Payer: Medicaid Other | Attending: Emergency Medicine | Admitting: Emergency Medicine

## 2020-09-11 DIAGNOSIS — R079 Chest pain, unspecified: Secondary | ICD-10-CM | POA: Diagnosis not present

## 2020-09-11 DIAGNOSIS — S2222XA Fracture of body of sternum, initial encounter for closed fracture: Secondary | ICD-10-CM | POA: Insufficient documentation

## 2020-09-11 DIAGNOSIS — Z79899 Other long term (current) drug therapy: Secondary | ICD-10-CM | POA: Diagnosis not present

## 2020-09-11 DIAGNOSIS — F17210 Nicotine dependence, cigarettes, uncomplicated: Secondary | ICD-10-CM | POA: Diagnosis not present

## 2020-09-11 DIAGNOSIS — R0789 Other chest pain: Secondary | ICD-10-CM | POA: Diagnosis not present

## 2020-09-11 DIAGNOSIS — S9001XA Contusion of right ankle, initial encounter: Secondary | ICD-10-CM

## 2020-09-11 DIAGNOSIS — I10 Essential (primary) hypertension: Secondary | ICD-10-CM | POA: Diagnosis not present

## 2020-09-11 DIAGNOSIS — E119 Type 2 diabetes mellitus without complications: Secondary | ICD-10-CM | POA: Diagnosis not present

## 2020-09-11 DIAGNOSIS — M25571 Pain in right ankle and joints of right foot: Secondary | ICD-10-CM | POA: Diagnosis not present

## 2020-09-11 DIAGNOSIS — S99921A Unspecified injury of right foot, initial encounter: Secondary | ICD-10-CM | POA: Diagnosis not present

## 2020-09-11 DIAGNOSIS — S2220XA Unspecified fracture of sternum, initial encounter for closed fracture: Secondary | ICD-10-CM | POA: Diagnosis not present

## 2020-09-11 DIAGNOSIS — S299XXA Unspecified injury of thorax, initial encounter: Secondary | ICD-10-CM | POA: Diagnosis present

## 2020-09-11 MED ORDER — OXYCODONE-ACETAMINOPHEN 5-325 MG PO TABS
1.0000 | ORAL_TABLET | Freq: Once | ORAL | Status: AC
  Administered 2020-09-11: 1 via ORAL
  Filled 2020-09-11: qty 1

## 2020-09-11 MED ORDER — OXYCODONE-ACETAMINOPHEN 5-325 MG PO TABS: 1.0000 | ORAL_TABLET | Freq: Four times a day (QID) | ORAL | 0 refills | Status: DC | PRN

## 2020-09-11 MED ORDER — METHOCARBAMOL 500 MG PO TABS: 500.0000 mg | ORAL_TABLET | Freq: Two times a day (BID) | ORAL | 0 refills | Status: DC
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
## 2020-09-11 NOTE — ED Notes (Signed)
Pt verbalized dc instructions and follow up care. Alert and assisted out using wheelchair. No Iv. Leaving with family. Verbalized understanding of incentive spirometer.
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
## 2020-09-11 NOTE — Discharge Instructions (Addendum)
You have a fracture of the sternum which is the breastbone of your chest in the very center of your chest where your pain is.  This will likely hurt more tomorrow that it is currently.  Your lungs and heart appear normal on the chest x-ray.  As we discussed chest x-rays are not a perfect test however given that your fracture is likely causing this pain will give you a incentive spirometer to use.  Please use this frequently throughout the day least 4 times daily.  Please take your blood pressure medicine as prescribed.  Please followup with your primary care doctor. Return to the ER for any new or concerning symptoms. You will likely feel more pain and aches in the morning than currently.  Take the muscle relaxer I have prescribed you. I have also given you robaxin which will help with muscle spasms. Both of these medicines will make you drowsy. DO NOT DRIVE WHILE TAKING.  Please use Tylenol or ibuprofen for pain.  You may use 600 mg ibuprofen every 6 hours or 1000 mg of Tylenol every 6 hours.  You may choose to alternate between the 2.  This would be most effective.  Not to exceed 4 g of Tylenol within 24 hours.  Not to exceed 3200 mg ibuprofen 24 hours.   I am prescribing you several tablets of Percocet to use for severe pain.  You may also use Tylenol and ibuprofen and alternate between the 2.

## 2020-09-12 ENCOUNTER — Telehealth: Payer: Self-pay

## 2020-09-12 NOTE — Telephone Encounter (Signed)
Transition Care Management Unsuccessful Follow-up Telephone Call  Date of discharge and from where:  09/11/2020 [facility]   Attempts:  1st Attempt  Reason for unsuccessful TCM follow-up call:  Left voice message

## 2020-09-15 NOTE — Telephone Encounter (Signed)
Transition Care Management Follow-up Telephone Call  Date of discharge and from where: 09/11/2020 [name]    How have you been since you were released from the hospital? In a lot of pain, has tried to contact ED nurse.   Any questions or concerns? No  Items Reviewed:  Did the pt receive and understand the discharge instructions provided? Yes   Medications obtained and verified? Yes   Other? No   Any new allergies since your discharge? No   Dietary orders reviewed? Yes  Do you have support at home? Yes   Home Care and Equipment/Supplies: Were home health services ordered? not applicable If so, what is the name of the agency? na  Has the agency set up a time to come to the patient's home? not applicable Were any new equipment or medical supplies ordered?  No What is the name of the medical supply agency? na Were you able to get the supplies/equipment? not applicable Do you have any questions related to the use of the equipment or supplies? No  Functional Questionnaire: (I = Independent and D = Dependent) ADLs: I  Bathing/Dressing- I  Meal Prep- I  Eating- I  Maintaining continence- I  Transferring/Ambulation- I  Managing Meds- I  Follow up appointments reviewed:   PCP Hospital f/u appt confirmed? Yes  Scheduled to see Dionisio David, NP on 09/25/2020 @ 2pm.  [facility] f/u appt confirmed? No    Are transportation arrangements needed? No   If their condition worsens, is the pt aware to call PCP or go to the Emergency Dept.? Yes  Was the patient provided with contact information for the PCP's office or ED? Yes  Was to pt encouraged to call back with questions or concerns? Yes

## 2020-09-25 ENCOUNTER — Ambulatory Visit: Payer: Medicaid Other | Admitting: Nurse Practitioner

## 2020-09-28 ENCOUNTER — Other Ambulatory Visit: Payer: Self-pay

## 2020-09-28 ENCOUNTER — Ambulatory Visit (INDEPENDENT_AMBULATORY_CARE_PROVIDER_SITE_OTHER): Payer: Medicaid Other | Admitting: Nurse Practitioner

## 2020-09-28 ENCOUNTER — Encounter: Payer: Self-pay | Admitting: Nurse Practitioner

## 2020-09-28 VITALS — BP 139/78 | HR 64 | Temp 97.7°F | Resp 16 | Ht 67.0 in | Wt 142.2 lb

## 2020-09-28 DIAGNOSIS — Z7689 Persons encountering health services in other specified circumstances: Secondary | ICD-10-CM

## 2020-09-28 DIAGNOSIS — E785 Hyperlipidemia, unspecified: Secondary | ICD-10-CM

## 2020-09-28 DIAGNOSIS — S2220XD Unspecified fracture of sternum, subsequent encounter for fracture with routine healing: Secondary | ICD-10-CM

## 2020-09-28 DIAGNOSIS — E118 Type 2 diabetes mellitus with unspecified complications: Secondary | ICD-10-CM

## 2020-09-28 DIAGNOSIS — Z125 Encounter for screening for malignant neoplasm of prostate: Secondary | ICD-10-CM | POA: Diagnosis not present

## 2020-09-28 DIAGNOSIS — R0789 Other chest pain: Secondary | ICD-10-CM

## 2020-09-28 DIAGNOSIS — Z1211 Encounter for screening for malignant neoplasm of colon: Secondary | ICD-10-CM

## 2020-09-28 DIAGNOSIS — I10 Essential (primary) hypertension: Secondary | ICD-10-CM | POA: Diagnosis not present

## 2020-09-28 LAB — POCT URINALYSIS DIPSTICK
Bilirubin, UA: NEGATIVE
Blood, UA: NEGATIVE
Glucose, UA: NEGATIVE
Ketones, UA: NEGATIVE
Nitrite, UA: NEGATIVE
Protein, UA: POSITIVE — AB
Spec Grav, UA: <=1.005 — AB (ref 1.010–1.025)
Urobilinogen, UA: 0.2 E.U./dL
pH, UA: 6 (ref 5.0–8.0)

## 2020-09-28 LAB — POCT GLYCOSYLATED HEMOGLOBIN (HGB A1C)
HbA1c POC (<> result, manual entry): 5.6 % (ref 4.0–5.6)
HbA1c, POC (controlled diabetic range): 5.6 % (ref 0.0–7.0)
HbA1c, POC (prediabetic range): 5.6 % — AB (ref 5.7–6.4)
Hemoglobin A1C: 5.6 % (ref 4.0–5.6)

## 2020-09-28 LAB — GLUCOSE, POCT (MANUAL RESULT ENTRY): POC Glucose: 118 mg/dl — AB (ref 70–99)

## 2020-09-28 MED ORDER — METHOCARBAMOL 500 MG PO TABS: 500.0000 mg | ORAL_TABLET | Freq: Two times a day (BID) | ORAL | 0 refills | Status: DC

## 2020-09-28 MED ORDER — ACETAMINOPHEN ER 650 MG PO TBCR: 650.0000 mg | EXTENDED_RELEASE_TABLET | Freq: Three times a day (TID) | ORAL | 1 refills | Status: AC | PRN

## 2020-09-28 MED ORDER — OXYCODONE-ACETAMINOPHEN 5-325 MG PO TABS: 1.0000 | ORAL_TABLET | Freq: Four times a day (QID) | ORAL | 0 refills | Status: DC | PRN
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
## 2020-09-29 LAB — COMP. METABOLIC PANEL (12)
AST: 39 IU/L (ref 0–40)
Albumin/Globulin Ratio: 1.4 (ref 1.2–2.2)
Albumin: 4.7 g/dL (ref 3.8–4.8)
Alkaline Phosphatase: 111 IU/L (ref 44–121)
BUN/Creatinine Ratio: 10 (ref 10–24)
BUN: 10 mg/dL (ref 8–27)
Bilirubin Total: 0.4 mg/dL (ref 0.0–1.2)
Calcium: 10 mg/dL (ref 8.6–10.2)
Chloride: 96 mmol/L (ref 96–106)
Creatinine, Ser: 0.98 mg/dL (ref 0.76–1.27)
GFR calc Af Amer: 94 mL/min/{1.73_m2} (ref >59)
GFR calc non Af Amer: 82 mL/min/{1.73_m2} (ref >59)
Globulin, Total: 3.3 g/dL (ref 1.5–4.5)
Glucose: 93 mg/dL (ref 65–99)
Potassium: 4.3 mmol/L (ref 3.5–5.2)
Sodium: 134 mmol/L (ref 134–144)
Total Protein: 8 g/dL (ref 6.0–8.5)

## 2020-09-29 LAB — PSA: Prostate Specific Ag, Serum: 1.5 ng/mL (ref 0.0–4.0)

## 2020-09-29 LAB — MICROALBUMIN, URINE: Microalbumin, Urine: 171.8 ug/mL

## 2020-10-03 ENCOUNTER — Encounter: Payer: Self-pay | Admitting: Nurse Practitioner

## 2020-10-05 ENCOUNTER — Ambulatory Visit: Payer: Medicaid Other | Admitting: Family Medicine

## 2020-10-12 ENCOUNTER — Encounter: Payer: Self-pay | Admitting: Podiatry

## 2020-10-12 ENCOUNTER — Other Ambulatory Visit: Payer: Self-pay

## 2020-10-12 ENCOUNTER — Ambulatory Visit (INDEPENDENT_AMBULATORY_CARE_PROVIDER_SITE_OTHER): Payer: Medicaid Other | Admitting: Podiatry

## 2020-10-12 DIAGNOSIS — S92911A Unspecified fracture of right toe(s), initial encounter for closed fracture: Secondary | ICD-10-CM | POA: Diagnosis not present

## 2020-10-12 DIAGNOSIS — D492 Neoplasm of unspecified behavior of bone, soft tissue, and skin: Secondary | ICD-10-CM
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
## 2020-12-28 ENCOUNTER — Ambulatory Visit: Payer: Medicaid Other | Admitting: Nurse Practitioner

## 2021-01-04 ENCOUNTER — Ambulatory Visit: Payer: Medicaid Other | Admitting: Nurse Practitioner

## 2021-05-14 ENCOUNTER — Emergency Department (HOSPITAL_COMMUNITY): Payer: Medicaid Other

## 2021-05-14 ENCOUNTER — Inpatient Hospital Stay (HOSPITAL_COMMUNITY): Payer: Medicaid Other

## 2021-05-14 ENCOUNTER — Other Ambulatory Visit: Payer: Self-pay

## 2021-05-14 ENCOUNTER — Inpatient Hospital Stay (HOSPITAL_COMMUNITY)
Admission: EM | Admit: 2021-05-14 | Discharge: 2021-05-16 | DRG: 066 | Disposition: A | Discharge status: Home / Self Care | Payer: Medicaid Other | Attending: Family Medicine | Admitting: Family Medicine

## 2021-05-14 DIAGNOSIS — Z8249 Family history of ischemic heart disease and other diseases of the circulatory system: Secondary | ICD-10-CM | POA: Diagnosis not present

## 2021-05-14 DIAGNOSIS — F1290 Cannabis use, unspecified, uncomplicated: Secondary | ICD-10-CM | POA: Diagnosis not present

## 2021-05-14 DIAGNOSIS — I6523 Occlusion and stenosis of bilateral carotid arteries: Secondary | ICD-10-CM | POA: Diagnosis not present

## 2021-05-14 DIAGNOSIS — E1140 Type 2 diabetes mellitus with diabetic neuropathy, unspecified: Secondary | ICD-10-CM | POA: Diagnosis not present

## 2021-05-14 DIAGNOSIS — F17210 Nicotine dependence, cigarettes, uncomplicated: Secondary | ICD-10-CM | POA: Diagnosis present

## 2021-05-14 DIAGNOSIS — R29701 NIHSS score 1: Secondary | ICD-10-CM | POA: Diagnosis present

## 2021-05-14 DIAGNOSIS — Z79899 Other long term (current) drug therapy: Secondary | ICD-10-CM

## 2021-05-14 DIAGNOSIS — F10920 Alcohol use, unspecified with intoxication, uncomplicated: Secondary | ICD-10-CM

## 2021-05-14 DIAGNOSIS — R29810 Facial weakness: Secondary | ICD-10-CM | POA: Diagnosis present

## 2021-05-14 DIAGNOSIS — R531 Weakness: Secondary | ICD-10-CM | POA: Diagnosis not present

## 2021-05-14 DIAGNOSIS — R4781 Slurred speech: Secondary | ICD-10-CM | POA: Diagnosis not present

## 2021-05-14 DIAGNOSIS — R4701 Aphasia: Secondary | ICD-10-CM | POA: Diagnosis not present

## 2021-05-14 DIAGNOSIS — Z720 Tobacco use: Secondary | ICD-10-CM | POA: Diagnosis not present

## 2021-05-14 DIAGNOSIS — M19019 Primary osteoarthritis, unspecified shoulder: Secondary | ICD-10-CM | POA: Diagnosis not present

## 2021-05-14 DIAGNOSIS — M19011 Primary osteoarthritis, right shoulder: Secondary | ICD-10-CM | POA: Diagnosis not present

## 2021-05-14 DIAGNOSIS — I6381 Other cerebral infarction due to occlusion or stenosis of small artery: Secondary | ICD-10-CM | POA: Diagnosis not present

## 2021-05-14 DIAGNOSIS — R29898 Other symptoms and signs involving the musculoskeletal system: Secondary | ICD-10-CM | POA: Diagnosis not present

## 2021-05-14 DIAGNOSIS — Z888 Allergy status to other drugs, medicaments and biological substances status: Secondary | ICD-10-CM

## 2021-05-14 DIAGNOSIS — M25511 Pain in right shoulder: Secondary | ICD-10-CM | POA: Diagnosis not present

## 2021-05-14 DIAGNOSIS — M1990 Unspecified osteoarthritis, unspecified site: Secondary | ICD-10-CM | POA: Diagnosis present

## 2021-05-14 DIAGNOSIS — Z7984 Long term (current) use of oral hypoglycemic drugs: Secondary | ICD-10-CM | POA: Diagnosis not present

## 2021-05-14 DIAGNOSIS — F10120 Alcohol abuse with intoxication, uncomplicated: Secondary | ICD-10-CM | POA: Diagnosis present

## 2021-05-14 DIAGNOSIS — G8321 Monoplegia of upper limb affecting right dominant side: Secondary | ICD-10-CM | POA: Diagnosis present

## 2021-05-14 DIAGNOSIS — I10 Essential (primary) hypertension: Secondary | ICD-10-CM | POA: Diagnosis not present

## 2021-05-14 DIAGNOSIS — E118 Type 2 diabetes mellitus with unspecified complications: Secondary | ICD-10-CM

## 2021-05-14 DIAGNOSIS — R270 Ataxia, unspecified: Secondary | ICD-10-CM | POA: Diagnosis present

## 2021-05-14 DIAGNOSIS — E119 Type 2 diabetes mellitus without complications: Secondary | ICD-10-CM | POA: Diagnosis present

## 2021-05-14 DIAGNOSIS — E7800 Pure hypercholesterolemia, unspecified: Secondary | ICD-10-CM | POA: Diagnosis present

## 2021-05-14 DIAGNOSIS — F1010 Alcohol abuse, uncomplicated: Secondary | ICD-10-CM | POA: Diagnosis not present

## 2021-05-14 DIAGNOSIS — I6389 Other cerebral infarction: Secondary | ICD-10-CM | POA: Diagnosis not present

## 2021-05-14 DIAGNOSIS — I639 Cerebral infarction, unspecified: Secondary | ICD-10-CM | POA: Diagnosis present

## 2021-05-14 DIAGNOSIS — M069 Rheumatoid arthritis, unspecified: Secondary | ICD-10-CM | POA: Diagnosis present

## 2021-05-14 DIAGNOSIS — K219 Gastro-esophageal reflux disease without esophagitis: Secondary | ICD-10-CM | POA: Diagnosis present

## 2021-05-14 DIAGNOSIS — I6300 Cerebral infarction due to thrombosis of unspecified precerebral artery: Secondary | ICD-10-CM | POA: Diagnosis not present

## 2021-05-14 DIAGNOSIS — Z20822 Contact with and (suspected) exposure to covid-19: Secondary | ICD-10-CM | POA: Diagnosis present

## 2021-05-14 DIAGNOSIS — R29818 Other symptoms and signs involving the nervous system: Secondary | ICD-10-CM | POA: Diagnosis not present

## 2021-05-14 DIAGNOSIS — F4310 Post-traumatic stress disorder, unspecified: Secondary | ICD-10-CM | POA: Diagnosis present

## 2021-05-14 DIAGNOSIS — Z8673 Personal history of transient ischemic attack (TIA), and cerebral infarction without residual deficits: Secondary | ICD-10-CM | POA: Diagnosis not present

## 2021-05-14 DIAGNOSIS — G319 Degenerative disease of nervous system, unspecified: Secondary | ICD-10-CM | POA: Diagnosis not present

## 2021-05-14 LAB — RESP PANEL BY RT-PCR (FLU A&B, COVID) ARPGX2
Influenza A by PCR: NEGATIVE
Influenza B by PCR: NEGATIVE
SARS Coronavirus 2 by RT PCR: NEGATIVE

## 2021-05-14 LAB — ETHANOL: Alcohol, Ethyl (B): 56 mg/dL — ABNORMAL HIGH (ref <10)

## 2021-05-14 LAB — HEMOGLOBIN A1C
Hgb A1c MFr Bld: 5.9 % — ABNORMAL HIGH (ref 4.8–5.6)
Mean Plasma Glucose: 122.63 mg/dL

## 2021-05-14 LAB — COMPREHENSIVE METABOLIC PANEL
ALT: 24 U/L (ref 0–44)
AST: 34 U/L (ref 15–41)
Albumin: 3.9 g/dL (ref 3.5–5.0)
Alkaline Phosphatase: 79 U/L (ref 38–126)
Anion gap: 11 (ref 5–15)
BUN: 8 mg/dL (ref 8–23)
CO2: 22 mmol/L (ref 22–32)
Calcium: 9.4 mg/dL (ref 8.9–10.3)
Chloride: 101 mmol/L (ref 98–111)
Creatinine, Ser: 0.75 mg/dL (ref 0.61–1.24)
GFR, Estimated: >60 mL/min (ref >60)
Glucose, Bld: 98 mg/dL (ref 70–99)
Potassium: 3.7 mmol/L (ref 3.5–5.1)
Sodium: 134 mmol/L — ABNORMAL LOW (ref 135–145)
Total Bilirubin: 0.7 mg/dL (ref 0.3–1.2)
Total Protein: 7.8 g/dL (ref 6.5–8.1)

## 2021-05-14 LAB — URINALYSIS, ROUTINE W REFLEX MICROSCOPIC
Bacteria, UA: NONE SEEN
Bilirubin Urine: NEGATIVE
Glucose, UA: NEGATIVE mg/dL
Hgb urine dipstick: NEGATIVE
Ketones, ur: NEGATIVE mg/dL
Nitrite: NEGATIVE
Protein, ur: 100 mg/dL — AB
Specific Gravity, Urine: 1.009 (ref 1.005–1.030)
pH: 6 (ref 5.0–8.0)

## 2021-05-14 LAB — RAPID URINE DRUG SCREEN, HOSP PERFORMED
Amphetamines: NOT DETECTED
Barbiturates: NOT DETECTED
Benzodiazepines: NOT DETECTED
Cocaine: NOT DETECTED
Opiates: NOT DETECTED
Tetrahydrocannabinol: POSITIVE — AB

## 2021-05-14 LAB — APTT: aPTT: 28 seconds (ref 24–36)

## 2021-05-14 LAB — DIFFERENTIAL
Abs Immature Granulocytes: 0.03 10*3/uL (ref 0.00–0.07)
Basophils Absolute: 0 10*3/uL (ref 0.0–0.1)
Basophils Relative: 0 %
Eosinophils Absolute: 0 10*3/uL (ref 0.0–0.5)
Eosinophils Relative: 0 %
Immature Granulocytes: 0 %
Lymphocytes Relative: 39 %
Lymphs Abs: 4.2 10*3/uL — ABNORMAL HIGH (ref 0.7–4.0)
Monocytes Absolute: 0.7 10*3/uL (ref 0.1–1.0)
Monocytes Relative: 7 %
Neutro Abs: 5.7 10*3/uL (ref 1.7–7.7)
Neutrophils Relative %: 54 %

## 2021-05-14 LAB — LDL CHOLESTEROL, DIRECT: Direct LDL: 96.5 mg/dL (ref 0–99)

## 2021-05-14 LAB — CBC
HCT: 48 % (ref 39.0–52.0)
Hemoglobin: 16.6 g/dL (ref 13.0–17.0)
MCH: 33.1 pg (ref 26.0–34.0)
MCHC: 34.6 g/dL (ref 30.0–36.0)
MCV: 95.6 fL (ref 80.0–100.0)
Platelets: 220 10*3/uL (ref 150–400)
RBC: 5.02 MIL/uL (ref 4.22–5.81)
RDW: 12 % (ref 11.5–15.5)
WBC: 10.7 10*3/uL — ABNORMAL HIGH (ref 4.0–10.5)
nRBC: 0 % (ref 0.0–0.2)

## 2021-05-14 LAB — PROTIME-INR
INR: 1.1 (ref 0.8–1.2)
Prothrombin Time: 13.8 seconds (ref 11.4–15.2)

## 2021-05-14 LAB — MAGNESIUM: Magnesium: 1.9 mg/dL (ref 1.7–2.4)

## 2021-05-14 LAB — HIV ANTIBODY (ROUTINE TESTING W REFLEX): HIV Screen 4th Generation wRfx: NONREACTIVE

## 2021-05-14 LAB — CBG MONITORING, ED: Glucose-Capillary: 108 mg/dL — ABNORMAL HIGH (ref 70–99)

## 2021-05-14 LAB — PHOSPHORUS: Phosphorus: 3.4 mg/dL (ref 2.5–4.6)

## 2021-05-14 MED ORDER — STROKE: EARLY STAGES OF RECOVERY BOOK
Freq: Once | Status: AC
  Filled 2021-05-14: qty 1

## 2021-05-14 MED ORDER — FOLIC ACID 1 MG PO TABS
1.0000 mg | ORAL_TABLET | Freq: Every day | ORAL | Status: DC
  Administered 2021-05-14 – 2021-05-16 (×3): 1 mg via ORAL
  Filled 2021-05-14 (×3): qty 1

## 2021-05-14 MED ORDER — THIAMINE HCL 100 MG/ML IJ SOLN
100.0000 mg | Freq: Every day | INTRAMUSCULAR | Status: DC
  Administered 2021-05-15: 100 mg via INTRAVENOUS
  Filled 2021-05-14: qty 2

## 2021-05-14 MED ORDER — NICOTINE 14 MG/24HR TD PT24
14.0000 mg | MEDICATED_PATCH | Freq: Every day | TRANSDERMAL | Status: DC
  Filled 2021-05-14 (×3): qty 1

## 2021-05-14 MED ORDER — ACETAMINOPHEN 325 MG PO TABS: 650.0000 mg | ORAL_TABLET | ORAL | Status: DC | PRN

## 2021-05-14 MED ORDER — GEMFIBROZIL 600 MG PO TABS
600.0000 mg | ORAL_TABLET | Freq: Two times a day (BID) | ORAL | Status: DC
  Administered 2021-05-15 – 2021-05-16 (×3): 600 mg via ORAL
  Filled 2021-05-14 (×5): qty 1

## 2021-05-14 MED ORDER — INSULIN ASPART 100 UNIT/ML IJ SOLN
0.0000 [IU] | INTRAMUSCULAR | Status: DC
  Administered 2021-05-15 – 2021-05-16 (×5): 1 [IU] via SUBCUTANEOUS

## 2021-05-14 MED ORDER — CLOPIDOGREL BISULFATE 75 MG PO TABS
75.0000 mg | ORAL_TABLET | Freq: Every day | ORAL | Status: DC
  Administered 2021-05-14 – 2021-05-16 (×3): 75 mg via ORAL
  Filled 2021-05-14 (×3): qty 1

## 2021-05-14 MED ORDER — SODIUM CHLORIDE 0.9 % IV SOLN: INTRAVENOUS | Status: DC

## 2021-05-14 MED ORDER — ACETAMINOPHEN 500 MG PO TABS
1000.0000 mg | ORAL_TABLET | Freq: Once | ORAL | Status: AC
  Administered 2021-05-14: 1000 mg via ORAL
  Filled 2021-05-14: qty 2

## 2021-05-14 MED ORDER — OXYCODONE HCL 5 MG PO TABS
5.0000 mg | ORAL_TABLET | Freq: Once | ORAL | Status: AC
Start: 2021-05-14 — End: 2021-05-14
  Administered 2021-05-14: 5 mg via ORAL
  Filled 2021-05-14: qty 1

## 2021-05-14 MED ORDER — THIAMINE HCL 100 MG PO TABS
100.0000 mg | ORAL_TABLET | Freq: Every day | ORAL | Status: DC
  Administered 2021-05-14 – 2021-05-16 (×3): 100 mg via ORAL
  Filled 2021-05-14 (×3): qty 1

## 2021-05-14 MED ORDER — ACETAMINOPHEN 650 MG RE SUPP: 650.0000 mg | RECTAL | Status: DC | PRN

## 2021-05-14 MED ORDER — ADULT MULTIVITAMIN W/MINERALS CH
1.0000 | ORAL_TABLET | Freq: Every day | ORAL | Status: DC
  Administered 2021-05-14 – 2021-05-16 (×3): 1 via ORAL
  Filled 2021-05-14 (×3): qty 1

## 2021-05-14 MED ORDER — ONDANSETRON HCL 4 MG/2ML IJ SOLN
4.0000 mg | Freq: Once | INTRAMUSCULAR | Status: AC
  Administered 2021-05-14: 4 mg via INTRAVENOUS
  Filled 2021-05-14: qty 2

## 2021-05-14 MED ORDER — LORAZEPAM 1 MG PO TABS: 1.0000 mg | ORAL_TABLET | ORAL | Status: DC | PRN

## 2021-05-14 MED ORDER — OXYCODONE HCL 5 MG PO TABS
10.0000 mg | ORAL_TABLET | Freq: Four times a day (QID) | ORAL | Status: DC | PRN
  Administered 2021-05-14 – 2021-05-16 (×7): 10 mg via ORAL
  Filled 2021-05-14 (×7): qty 2

## 2021-05-14 MED ORDER — MORPHINE SULFATE (PF) 4 MG/ML IV SOLN
4.0000 mg | Freq: Once | INTRAVENOUS | Status: AC
Start: 2021-05-14 — End: 2021-05-14
  Administered 2021-05-14: 4 mg via INTRAVENOUS
  Filled 2021-05-14: qty 1

## 2021-05-14 MED ORDER — IOHEXOL 350 MG/ML SOLN
75.0000 mL | Freq: Once | INTRAVENOUS | Status: AC | PRN
  Administered 2021-05-14: 75 mL via INTRAVENOUS

## 2021-05-14 MED ORDER — LORAZEPAM 2 MG/ML IJ SOLN: 1.0000 mg | INTRAMUSCULAR | Status: DC | PRN

## 2021-05-14 MED ORDER — ACETAMINOPHEN 160 MG/5ML PO SOLN: 650.0000 mg | ORAL | Status: DC | PRN

## 2021-05-14 NOTE — ED Triage Notes (Signed)
C/O slurred speech and right sided weakness started on Thursday.

## 2021-05-14 NOTE — ED Notes (Signed)
Pt received dinner tray.

## 2021-05-14 NOTE — Consult Note (Addendum)
NEUROLOGY CONSULTATION NOTE   Date of service: May 14, 2021 Patient Name: [name] MRN:  [id number] DOB:  10-10-1956 Reason for consult: "pontine stroke" Requesting Provider: Toy Baker, MD _ _ _   _ __   _ __ _ _  __ __   _ __   __ _  History of Present Illness  [name] is a 65 y.o. [name] with PMH significant for GERd, DM2, HTN, HLD, OA, EtOh use and prior pancreatitis, tobacco use, who presents with R arm weakness x 4 days.  He was sitting on the porch with friends and family on 05/10/21, when he had R arm weakness and slurred speech. He has had 4 or 5 beers prior to the onset of symptoms so he and family attributed this to his EtOh intake. Symptoms were a little improved on Friday but still present. 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  Drinks 40-50Oz of beer a day for several years, smokes about 1 pack of cigarettes every 3 days, last saw his PCP 6 months ago. Has not taken his Blood pressure or diabetes meds from sometime. No family hx of strokes, denies personal history of strokes.  mRS: 0 tPA/Thrombectomy: outside window NIHSS components Score: Comment  1a Level of Conscious 0[x]  1[]  2[]  3[]      1b LOC Questions 0[x]  1[]  2[]       1c LOC Commands 0[x]  1[]  2[]       2 Best Gaze 0[x]  1[]  2[]       3 Visual 0[x]  1[]  2[]  3[]      4 Facial Palsy 0[x]  1[]  2[]  3[]      5a Motor Arm - left 0[x]  1[]  2[]  3[]  4[]  UN[]    5b Motor Arm - Right 0[]  1[x]  2[]  3[]  4[]  UN[]    6a Motor Leg - Left 0[x]  1[]  2[]  3[]  4[]  UN[]    6b Motor Leg - Right 0[x]  1[]  2[]  3[]  4[]  UN[]    7 Limb Ataxia 0[x]  1[]  2[]  3[]  UN[]     8 Sensory 0[x]  1[]  2[]  UN[]      9 Best Language 0[x]  1[]  2[]  3[]      10 Dysarthria 0[]  1[x]  2[]  UN[]      11 Extinct. and Inattention 0[x]  1[]  2[]       TOTAL: 2       ROS   Constitutional Denies weight loss, fever and chills.   HEENT Denies changes in vision and hearing.   Respiratory Denies SOB and cough.   CV Denies  palpitations and CP   GI Denies abdominal pain, nausea, vomiting and diarrhea.   GU Denies dysuria and urinary frequency.   MSK Denies myalgia and joint pain.   Skin Denies rash and pruritus.   Neurological Denies headache and syncope.   Psychiatric Denies recent changes in mood. Denies anxiety and depression.    Past History   Past Medical History:  Diagnosis Date   Alcohol abuse    Allergic rhinitis    Anxiety    With Post traumatic stress disorder   Diabetes mellitus without complication ([place])    'sometime last yr'--type 2   GERD (gastroesophageal reflux disease)    Headache    "like migraines"   Hepatitis    he thinks its hep b or c   Hyperlipidemia    Hypertension    Osteoarthritis    Pancreatitis    Rheumatoid arthritis(714.0)    Tobacco user    Past Surgical History:  Procedure Laterality Date  COLONOSCOPY     FRACTURE SURGERY     cheek bone fracture    [facility]     right shoulder  -- 2010   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   Procedure: TOTAL HIP ARTHROPLASTY ANTERIOR APPROACH;  Surgeon: Renette Butters, MD;  Location: [facility];  Service: Orthopedics;  Laterality: Right;   WRIST SURGERY     fusion with pins  2007   Family History  Problem Relation Age of Onset   Kidney disease Brother    Angioedema Maternal Aunt        Tongue swelling   Heart attack Mother    Heart attack Father    Social History   Socioeconomic History   Marital status: Single    Spouse name: Not on file   Number of children: Not on file   Years of education: Not on file   Highest education level: Not on file  Occupational History   Not on file  Tobacco Use   Smoking status: Every Day    Packs/day: 0.40    Years: 25.00    Pack years: 10.00    Types: Cigarettes   Smokeless tobacco: Never   Tobacco comments:    trying  Vaping Use   Vaping Use: Never used  Substance and Sexual Activity   Alcohol use: Yes    Alcohol/week: 7.0 - 14.0 standard drinks    Types: 7 - 14 Cans of  beer per week    Comment: 1-2 beers occasionally   Drug use: No   Sexual activity: Yes  Other Topics Concern   Not on file  Social History Narrative   Sharyon Cable. Married- 12 [name] sexual partner.   Denies drug use but has hx of crack cocaine, alcohol and tobacco use.   Social Determinants of Health   Financial Resource Strain: Not on file  Food Insecurity: Not on file  Transportation Needs: Not on file  Physical Activity: Not on file  Stress: Not on file  Social Connections: Not on file   Allergies  Allergen Reactions   Lisinopril Other (See Comments)    angioedema   Nsaids Other (See Comments)    Stomach Irritability     Medications  (Not in a hospital admission)    Vitals   Vitals:   05/14/21 1623 05/14/21 1630 05/14/21 1824 05/14/21 1915  BP: (!) 198/94 (!) 157/101 (!) 201/102 (!) 200/90  Pulse: 74 76 67 73  Resp: 15 16 15  (!) 23  Temp: 98.1 F (36.7 C)  98 F (36.7 C)   TempSrc: Oral  Oral   SpO2: 100% 100% 99% 94%  Weight:      Height:         Body mass index is 22.27 kg/m.  Physical Exam   General: Laying comfortably in bed; in no acute distress.  HENT: Normal oropharynx and mucosa. Normal external appearance of ears and nose.  Neck: Supple, no pain or tenderness  CV: No JVD. No peripheral edema.  Pulmonary: Symmetric Chest rise. Normal respiratory effort.  Abdomen: Soft to touch, non-tender.  Ext: No cyanosis, edema, or deformity  Skin: No rash. Normal palpation of skin.   Musculoskeletal: Normal digits and nails by inspection. No clubbing.   Neurologic Examination  Mental status/Cognition: Alert, oriented to self, place, month and year, good attention.  Speech/language: mildly dysarthric speech, fluent, comprehension intact, object naming intact, repetition intact.  Cranial nerves:   CN II Pupils equal and reactive to light, no VF deficits  CN III,IV,VI EOM intact, no gaze preference or deviation, no nystagmus    CN V normal sensation in V1,  V2, and V3 segments bilaterally    CN VII no asymmetry, no nasolabial fold flattening    CN VIII normal hearing to speech    CN IX & X normal palatal elevation, no uvular deviation    CN XI Pain with shoulder shrug on the right.   CN XII midline tongue protrusion    Motor:  Muscle bulk: poor, tone normal, pronator drift yes RUE drift. tremor None Mvmt Root Nerve  Muscle Right Left Comments  SA C5/6 Ax Deltoid 3 5   EF C5/6 Mc Biceps 4+ 5   EE C6/7/8 Rad Triceps 4+ 5   WF C6/7 Med FCR     WE C7/8 PIN ECU     F Ab C8/T1 U ADM/FDI 4+ 5   HF L1/2/3 Fem Illopsoas 5 5   KE L2/3/4 Fem Quad 5 5   DF L4/5 D Peron Tib Ant 5 5   PF S1/2 Tibial Grc/Sol 5 5    Reflexes:  Right Left Comments  Pectoralis      Biceps (C5/6) 1 1   Brachioradialis (C5/6) 1 1    Triceps (C6/7) 1 1    Patellar (L3/4) 0 0    Achilles (S1) 1 1    Hoffman      Plantar     Jaw jerk    Sensation:  Light touch intact   Pin prick    Temperature    Vibration   Proprioception    Coordination/Complex Motor:  - Finger to Nose with ataxia in RUE - Heel to shin with ataxia BL - Rapid alternating movement are slowed in RUE - Gait: unsafe to assess given his stroke.  Labs   CBC:  Recent Labs  Lab 05/14/21 1321  WBC 10.7*  NEUTROABS 5.7  HGB 16.6  HCT 48.0  MCV 95.6  PLT 268    Basic Metabolic Panel:  Lab Results  Component Value Date   NA 134 (L) 05/14/2021   K 3.7 05/14/2021   CO2 22 05/14/2021   GLUCOSE 98 05/14/2021   BUN 8 05/14/2021   CREATININE 0.75 05/14/2021   CALCIUM 9.4 05/14/2021   GFRNONAA >60 05/14/2021   GFRAA 94 09/28/2020   Lipid Panel:  Lab Results  Component Value Date   LDLCALC 70 02/12/2017   HgbA1c:  Lab Results  Component Value Date   HGBA1C 5.6 09/28/2020   HGBA1C 5.6 09/28/2020   HGBA1C 5.6 (A) 09/28/2020   HGBA1C 5.6 09/28/2020   Urine Drug Screen:     Component Value Date/Time   LABOPIA NONE DETECTED 05/14/2021 1700   COCAINSCRNUR NONE DETECTED  05/14/2021 1700   COCAINSCRNUR NEG 07/21/2013 1053   LABBENZ NONE DETECTED 05/14/2021 1700   LABBENZ POS (A) 05/03/2011 1716   AMPHETMU NONE DETECTED 05/14/2021 1700   THCU POSITIVE (A) 05/14/2021 1700   LABBARB NONE DETECTED 05/14/2021 1700    Alcohol Level     Component Value Date/Time   ETH 56 (H) 05/14/2021 1321    CT Head without contrast: CTH was negative for a large hypodensity concerning for a large territory infarct or hyperdensity concerning for an ICH.  CT angio Head and Neck with contrast: 1. No intracranial arterial occlusion or high-grade stenosis. 2. Bilateral carotid bifurcation atherosclerosis with less than 50% stenosis.   MRI Brain(personally reviewed): 1. Acute/subacute 8.5 mm nonhemorrhagic infarct in the left pons. 2. Remote nonhemorrhagic  lacunar infarct of the right thalamus. 3. Remote hemorrhagic infarct in the posterior right cerebellum. 4. Atrophy and white matter disease is moderately advanced for age. This likely reflects the sequela of chronic microvascular ischemia. 5. Degenerative changes in the cervical spine with central canal stenosis at C3-4.  Impression   [name] is a 65 y.o. [name] with PMH significant for GERd, DM2, HTN, HLD, OA, EtOh use and prior pancreatitis, tobacco use, who presents with R arm weakness x 4 days. Found to have a L pontine stroke. His neurologic examination is notable for R arm weakness and mildly dysarthric speech along with RUE ataxia.  Likely this is a small vessel stroke.  Primary Diagnosis:  Other cerebral infarction due to occlusion of stenosis of small artery.  Secondary Diagnosis: Essential (primary) hypertension and Type 2 diabetes mellitus w/o complications Alcohol use disorder Current everyday smoker.  Recommendations  Plan:  Recommend that primary team order following: - Frequent Neuro checks per stroke unit protocol - Recommend brain imaging with MRI Brain without contrast - Recommend  Vascular imaging with MRA Angio Head without contrast and US Carotid doppler - Recommend obtaining TTE with bubble study - Recommend obtaining Lipid panel with LDL - Please start statin if LDL > 70 - Recommend HbA1c - Antithrombotic - continue plavix 75g daily, aspirin in the past caused stomach pains. - Recommend DVT ppx - SBP goal - gradual normotension, he is outside the window for permissive hypertensives.  - Recommend Telemetry monitoring for arrythmia - Recommend bedside swallow screen prior to PO intake. - Stroke education booklet - Recommend PT/OT/SLP consult - Counseled on the importance of quitting smoking to reduce risk of strokes in the future.  Alcohol use disorder: - Continue CIWA - start multivitamin and PO thiamine 100mg  daily replacement _____________________________________________________________________    Thank you for the opportunity to take part in the care of this patient. If you have any further questions, please contact the neurology consultation attending.  Signed,  [place] Pager Number [phone] _ _ _   _ __   _ __ _ _  __ __   _ __   __ _

## 2021-05-14 NOTE — H&P (Signed)
[name] MBT:[id number] DOB: [birth year] DOA: 05/14/2021     PCP:  [facility] Out[facility] Specialists:   NONE    Patient arrived to ER on 05/14/21 at 1206 Referred by Attending Deno Etienne, DO   Patient coming from: home Lives alone,           Chief Complaint:   Chief Complaint  Patient presents with   Extremity Weakness   Aphasia    HPI: [name] is a 65 y.o. male with medical history significant of EtOH abuse, PTSD, DM2, HLD, HTN, HepC RA, pancreatitis, tobacco abuse    Presented with  slurred speech and Right side weakness, facial droop LSN 4 days ago. Now slurred speech got better but he still has Right side weakness.  HE drinks 5 drinks a day and had extra while at family reunion 4 days ago Has been off his meds for the past few months HE smokes but wants to quit  In remote past he was told he has CHF but that was years ago  Has  been vaccinated against COVID    Initial COVID TEST  NEGATIVE   Lab Results  Component Value Date   [place] 05/14/2021   [employer] NEGATIVE 01/29/2020     Regarding pertinent Chronic problems:     Hyperlipidemia -  on statins Lopid Lipid Panel     Component Value Date/Time   CHOL 163 02/12/2017 1003   TRIG 144 02/12/2017 1003   HDL 64 02/12/2017 1003   CHOLHDL 2.5 02/12/2017 1003   VLDL 29 02/12/2017 1003   LDLCALC 70 02/12/2017 1003    HTN on NOrvasc, not compliant      DM 2 -  Lab Results  Component Value Date   HGBA1C 5.6 09/28/2020   HGBA1C 5.6 09/28/2020   HGBA1C 5.6 (A) 09/28/2020   HGBA1C 5.6 09/28/2020    on   PO meds only,      While in ER: EtOH level 56, Utox pos for [facility] Discussed with Neurology who rec MRI MRI showed acute/subacute CVA and evidence of prior CVA     ED Triage Vitals  Enc Vitals Group     BP 05/14/21 1228 (!) 151/84     Pulse Rate 05/14/21 1228 75     Resp 05/14/21 1228 17     Temp 05/14/21 1228 98.2 F (36.8 C)     Temp Source 05/14/21 1228 Oral      SpO2 05/14/21 1228 98 %     Weight 05/14/21 1245 142 lb 3.2 oz (64.5 kg)     Height 05/14/21 1245 5\' 7"  (1.702 m)     Head Circumference --      Peak Flow --      Pain Score 05/14/21 1245 0     Pain Loc --      Pain Edu? --      Excl. in [place]? --   TMAX(24)@     _________________________________________ Significant initial  Findings: Abnormal Labs Reviewed  ETHANOL - Abnormal; Notable for the following components:      Result Value   Alcohol, Ethyl (B) 56 (*)    All other components within normal limits  CBC - Abnormal; Notable for the following components:   WBC 10.7 (*)    All other components within normal limits  DIFFERENTIAL - Abnormal; Notable for the following components:   Lymphs Abs 4.2 (*)    All other components within normal limits  COMPREHENSIVE METABOLIC PANEL - Abnormal;  Notable for the following components:   Sodium 134 (*)    All other components within normal limits  RAPID URINE DRUG SCREEN, HOSP PERFORMED - Abnormal; Notable for the following components:   Tetrahydrocannabinol POSITIVE (*)    All other components within normal limits  URINALYSIS, ROUTINE W REFLEX MICROSCOPIC - Abnormal; Notable for the following components:   Protein, ur 100 (*)    Leukocytes,Ua TRACE (*)    All other components within normal limits   ____________________________________________ Ordered CT HEAD  NON acute  CXR -  NON acute MRI brain :  IMPRESSION: 1. Acute/subacute 8.5 mm nonhemorrhagic infarct in the left pons. 2. Remote nonhemorrhagic lacunar infarct of the right thalamus. 3. Remote hemorrhagic infarct in the posterior right cerebellum. 4. Atrophy and white matter disease is moderately advanced for age. This likely reflects the sequela of chronic microvascular ischemia. 5. Degenerative changes in the cervical spine with central canal stenosis at C3-4.   CTA head and Neck No intracranial arterial occlusion or high-grade stenosis. 2. Bilateral carotid bifurcation  atherosclerosis with less than 50% stenosis.  Right shoulder degenerative changes _________________________   ECG: Ordered Personally reviewed by me showing: HR : 70 Rhythm:  NSR,    no evidence of ischemic changes QTC 437   The recent clinical data is shown below. Vitals:   05/14/21 1623 05/14/21 1630 05/14/21 1824 05/14/21 1915  BP: (!) 198/94 (!) 157/101 (!) 201/102 (!) 200/90  Pulse: 74 76 67 73  Resp: 15 16 15  (!) 23  Temp: 98.1 F (36.7 C)  98 F (36.7 C)   TempSrc: Oral  Oral   SpO2: 100% 100% 99% 94%  Weight:      Height:          WBC     Component Value Date/Time   WBC 10.7 (H) 05/14/2021 1321   LYMPHSABS 4.2 (H) 05/14/2021 1321   MONOABS 0.7 05/14/2021 1321   EOSABS 0.0 05/14/2021 1321   BASOSABS 0.0 05/14/2021 1321        UA   no evidence of UTI     Urine analysis:    Component Value Date/Time   COLORURINE YELLOW 05/14/2021 1700   APPEARANCEUR CLEAR 05/14/2021 1700   LABSPEC 1.009 05/14/2021 1700   PHURINE 6.0 05/14/2021 1700   GLUCOSEU NEGATIVE 05/14/2021 1700   GLUCOSEU NEG mg/dL 01/11/2009 2031   HGBUR NEGATIVE 05/14/2021 1700   HGBUR negative 12/01/2007 1453   BILIRUBINUR NEGATIVE 05/14/2021 1700   BILIRUBINUR neg 09/28/2020 1452   KETONESUR NEGATIVE 05/14/2021 1700   PROTEINUR 100 (A) 05/14/2021 1700   UROBILINOGEN 0.2 09/28/2020 1452   UROBILINOGEN 0.2 02/12/2017 1010   NITRITE NEGATIVE 05/14/2021 1700   LEUKOCYTESUR TRACE (A) 05/14/2021 1700    Results for orders placed or performed during the hospital encounter of 05/14/21  Resp Panel by RT-PCR (Flu A&B, Covid) Nasopharyngeal Swab     Status: None   Collection Time: 05/14/21  1:54 PM   Specimen: Nasopharyngeal Swab; Nasopharyngeal(NP) swabs in vial transport medium  Result Value Ref Range Status   SARS Coronavirus 2 by RT PCR NEGATIVE NEGATIVE Final         Influenza A by PCR NEGATIVE NEGATIVE Final   Influenza B by PCR NEGATIVE NEGATIVE Final             _______________________________________________________ ER Provider Called:  neurology  Dr. Quinn Axe They Recommend admit to medicine    sEEN in ER by colegue _______________________________________________ Hospitalist was called for admission for CVA  The following Work up has been ordered so far:  Orders Placed This Encounter  Procedures   Resp Panel by RT-PCR (Flu A&B, Covid) Nasopharyngeal Swab   CT HEAD WO CONTRAST   DG Shoulder Right   MR BRAIN WO CONTRAST   CT ANGIO HEAD NECK W WO CM   Ethanol   Protime-INR   APTT   CBC   Differential   Comprehensive metabolic panel   Urine rapid drug screen (hosp performed)   Urinalysis, Routine w reflex microscopic   Hemoglobin A1c   LDL cholesterol, direct   Diet Heart Room service appropriate? Yes; Fluid consistency: Thin   Vital signs   Cardiac Monitoring   Modified Stroke Scale (mNIHSS) Document mNIHSS assessment every 2 hours for a total of 12 hours   Stroke swallow screen   Initiate Carrier Fluid Protocol   If O2 sat If O2 Sat < 94%, administer O2 at 2 liters/minute via nasal cannula.   Consult to Neuro Hospitalist   Consult for [facility] Admission   Pulse oximetry, continuous   I-stat chem 8, ED   ED EKG   ECHOCARDIOGRAM COMPLETE BUBBLE STUDY   Saline lock IV     Following Medications were ordered in ER: Medications  clopidogrel (PLAVIX) tablet 75 mg (75 mg Oral Given 05/14/21 1822)  morphine 4 MG/ML injection 4 mg (has no administration in time range)  ondansetron (ZOFRAN) injection 4 mg (has no administration in time range)  acetaminophen (TYLENOL) tablet 1,000 mg (1,000 mg Oral Given 05/14/21 1823)  oxyCODONE (Oxy IR/ROXICODONE) immediate release tablet 5 mg (5 mg Oral Given 05/14/21 1823)        Consult Orders  (From admission, onward)           Start     Ordered   05/14/21 1727  Consult to Neuro Hospitalist  Pg by Remo Lipps  Once       Provider:  (Not yet assigned)  Question Answer Comment  Place  call to: Neuro Hospitalist on call   Reason for Consult Consult      05/14/21 1727              OTHER Significant initial  Findings:  labs showing:    Recent Labs  Lab 05/14/21 1321  NA 134*  K 3.7  CO2 22  GLUCOSE 98  BUN 8  CREATININE 0.75  CALCIUM 9.4    Cr  stable,    Lab Results  Component Value Date   CREATININE 0.75 05/14/2021   CREATININE 0.98 09/28/2020   CREATININE 1.01 02/10/2019    Recent Labs  Lab 05/14/21 1321  AST 34  ALT 24  ALKPHOS 79  BILITOT 0.7  PROT 7.8  ALBUMIN 3.9   Lab Results  Component Value Date   CALCIUM 9.4 05/14/2021   PHOS 2.7 03/14/2008       Plt: Lab Results  Component Value Date   PLT 220 05/14/2021        Recent Labs  Lab 05/14/21 1321  WBC 10.7*  NEUTROABS 5.7  HGB 16.6  HCT 48.0  MCV 95.6  PLT 220    HG/HCT   stable,      Component Value Date/Time   HGB 16.6 05/14/2021 1321   HCT 48.0 05/14/2021 1321   MCV 95.6 05/14/2021 1321       Cardiac Panel (last 3 results) No results for input(s): CKTOTAL, CKMB, TROPONINI, RELINDX in the last 72 hours.     DM  labs:  HbA1C: Recent  Labs    09/28/20 1502  HGBA1C 5.6*  5.6  5.6  5.6       CBG (last 3)  Recent Labs    05/14/21 2012 05/15/21 0006  GLUCAP 108* 126*          Cultures:    Component Value Date/Time   SDES URINE, CLEAN CATCH 12/17/2008 0919   SPECREQUEST IMMUNE:NORM UT SYMPT:NEG 12/17/2008 0919   CULT INSIGNIFICANT GROWTH 12/17/2008 0919   REPTSTATUS 12/18/2008 FINAL 12/17/2008 0919     Radiological Exams on Admission: CT ANGIO HEAD NECK W WO CM  Result Date: 05/14/2021 CLINICAL DATA:  Slurred speech and right-sided weakness. [place] infarct EXAM: CT ANGIOGRAPHY HEAD AND NECK TECHNIQUE: Multidetector CT imaging of the head and neck was performed using the standard protocol during bolus administration of intravenous contrast. Multiplanar CT image reconstructions and MIPs were obtained to evaluate the vascular anatomy.  Carotid stenosis measurements (when applicable) are obtained utilizing NASCET criteria, using the distal internal carotid diameter as the denominator. CONTRAST:  54mL OMNIPAQUE IOHEXOL 350 MG/ML SOLN COMPARISON:  Brain MRI 05/14/2021 FINDINGS: CTA NECK FINDINGS SKELETON: There is no bony spinal canal stenosis. No lytic or blastic lesion. OTHER NECK: Normal pharynx, larynx and major salivary glands. No cervical lymphadenopathy. Unremarkable thyroid gland. UPPER CHEST: No pneumothorax or pleural effusion. No nodules or masses. AORTIC ARCH: There is no calcific atherosclerosis of the aortic arch. There is no aneurysm, dissection or hemodynamically significant stenosis of the visualized portion of the aorta. Conventional 3 vessel aortic branching pattern. The visualized proximal subclavian arteries are widely patent. RIGHT CAROTID SYSTEM: No dissection, occlusion or aneurysm. There is mixed density atherosclerosis extending into the proximal ICA, resulting in less than 50% stenosis. LEFT CAROTID SYSTEM: No dissection, occlusion or aneurysm. There is mixed density atherosclerosis extending into the proximal ICA, resulting in less than 50% stenosis. VERTEBRAL ARTERIES: Left dominant configuration. Atherosclerosis at both vertebral artery origins. There is no dissection, occlusion or flow-limiting stenosis to the skull base (V1-V3 segments). CTA HEAD FINDINGS POSTERIOR CIRCULATION: --Vertebral arteries: Normal V4 segments. --Inferior cerebellar arteries: Normal. --Basilar artery: Normal. --Superior cerebellar arteries: Normal. --Posterior cerebral arteries (PCA): Normal. ANTERIOR CIRCULATION: --Intracranial internal carotid arteries: Normal. --Anterior cerebral arteries (ACA): Normal. Both A1 segments are present. Patent anterior communicating artery (a-comm). --Middle cerebral arteries (MCA): Normal. VENOUS SINUSES: As permitted by contrast timing, patent. ANATOMIC VARIANTS: Both posterior communicating arteries are  patent. Review of the MIP images confirms the above findings. IMPRESSION: 1. No intracranial arterial occlusion or high-grade stenosis. 2. Bilateral carotid bifurcation atherosclerosis with less than 50% stenosis. Electronically Signed   By: Ulyses Jarred M.D.   On: 05/14/2021 20:10   DG Shoulder Right  Result Date: 05/14/2021 CLINICAL DATA:  Right shoulder pain. EXAM: RIGHT SHOULDER - 2+ VIEW COMPARISON:  Right shoulder radiographs 01/07/2017 FINDINGS: Progressive advanced degenerative changes are present in the right shoulder. Acromial spurring noted. Visualized hemithorax clear. IMPRESSION: Progressive advanced degenerative changes of the right shoulder. Electronically Signed   By: San Morelle M.D.   On: 05/14/2021 13:36   CT HEAD WO CONTRAST  Result Date: 05/14/2021 CLINICAL DATA:  Neuro deficit, acute, stroke suspected. Additional provided: Aphasia, extremity weakness. EXAM: CT HEAD WITHOUT CONTRAST TECHNIQUE: Contiguous axial images were obtained from the base of the skull through the vertex without intravenous contrast. COMPARISON:  CT head/cervical spine 01/07/2017. FINDINGS: Brain: Cerebral volume is normal. Mild patchy and ill-defined hypoattenuation within the cerebral white matter, nonspecific but compatible with chronic small vessel ischemic disease. There  is no acute intracranial hemorrhage. No demarcated cortical infarct. No extra-axial fluid collection. No evidence of an intracranial mass. No midline shift. Vascular: No hyperdense vessel.  Atherosclerotic calcifications. Skull: Normal. Negative for fracture or focal lesion. Sinuses/Orbits: Visualized orbits show no acute finding. Chronic inferiorly displaced fracture deformity of the right orbital floor. Small volume frothy secretions within a posterior right ethmoid air cell. IMPRESSION: No evidence of acute intra abnormality. Mild chronic small vessel ischemic changes within the cerebral white matter. Redemonstrated chronic  inferiorly displaced fracture deformity of the right orbital floor. Mild right ethmoid sinusitis. Electronically Signed   By: Kellie Simmering DO   On: 05/14/2021 14:59   MR BRAIN WO CONTRAST  Result Date: 05/14/2021 CLINICAL DATA:  Neuro deficit, acute, stroke suspected. Right upper extremity weakness beginning 4 days ago. EXAM: MRI HEAD WITHOUT CONTRAST TECHNIQUE: Multiplanar, multiecho pulse sequences of the brain and surrounding structures were obtained without intravenous contrast. COMPARISON:  CT head without contrast 05/14/2021. FINDINGS: Brain: Diffusion-weighted images demonstrate no acute nonhemorrhagic infarct in the left pons measuring up to 8.5 mm. T2 and FLAIR hyperintensities associated with the infarct. Acute hemorrhage is present. Periventricular and subcortical white matter changes are moderately advanced for age. Remote lacunar infarct is present in the right thalamus. Susceptibility artifact extending into the posterior right lentiform nucleus likely represents developmental venous anomaly. Susceptibility posteromedial right cerebellum compatible with remote hemorrhagic infarct. Brainstem and cerebellum are otherwise within normal limits. The internal auditory canals are within normal limits. The ventricles are of normal size. No significant extraaxial fluid collection is present. Vascular: Flow is present in the major intracranial arteries. Skull and upper cervical spine: Degenerative changes noted at C3-4 with sclerotic endplate change. Marrow signal otherwise normal. Central canal stenosis is present at C3-4. Craniocervical junction normal. Midline structures otherwise within limits. Sinuses/Orbits: The paranasal sinuses and mastoid air cells are clear. The globes and orbits are within normal limits. IMPRESSION: 1. Acute/subacute 8.5 mm nonhemorrhagic infarct in the left pons. 2. Remote nonhemorrhagic lacunar infarct of the right thalamus. 3. Remote hemorrhagic infarct in the posterior right  cerebellum. 4. Atrophy and white matter disease is moderately advanced for age. This likely reflects the sequela of chronic microvascular ischemia. 5. Degenerative changes in the cervical spine with central canal stenosis at C3-4. These results were called by telephone at the time of interpretation on 05/14/2021 at 5:24 pm to provider Deno Etienne, who verbally acknowledged these results. Electronically Signed   By: San Morelle M.D.   On: 05/14/2021 17:29   DG Chest Port 1 View  Result Date: 05/14/2021 CLINICAL DATA:  Stroke EXAM: PORTABLE CHEST 1 VIEW COMPARISON:  09/11/2020 FINDINGS: The heart size and mediastinal contours are within normal limits. Both lungs are clear. The visualized skeletal structures are unremarkable. IMPRESSION: No active disease. Electronically Signed   By: Donavan Foil M.D.   On: 05/14/2021 22:07   _______________________________________________________________________________________________________ Latest  Blood pressure (!) 200/90, pulse 73, temperature 98 F (36.7 C), temperature source Oral, resp. rate (!) 23, height 5\' 7"  (1.702 m), weight 64.5 kg, SpO2 94 %.   Review of Systems:    Pertinent positives include:   slurred speech,localizing neurological complaints, facial droop Constitutional:  No weight loss, night sweats, Fevers, chills, fatigue, weight loss  HEENT:  No headaches, Difficulty swallowing,Tooth/dental problems,Sore throat,  No sneezing, itching, ear ache, nasal congestion, post nasal drip,  Cardio-vascular:  No chest pain, Orthopnea, PND, anasarca, dizziness, palpitations.no Bilateral lower extremity swelling  GI:  No heartburn, indigestion, abdominal  pain, nausea, vomiting, diarrhea, change in bowel habits, loss of appetite, melena, blood in stool, hematemesis Resp:  no shortness of breath at rest. No dyspnea on exertion, No excess mucus, no productive cough, No non-productive cough, No coughing up of blood.No change in color of mucus.No  wheezing. Skin:  no rash or lesions. No jaundice GU:  no dysuria, change in color of urine, no urgency or frequency. No straining to urinate.  No flank pain.  Musculoskeletal:  No joint pain or no joint swelling. No decreased range of motion. No back pain.  Psych:  No change in mood or affect. No depression or anxiety. No memory loss.  Neuro: no , no tingling, no weakness, no double vision, no gait abnormality, no  no confusion  All systems reviewed and apart from [place] all are negative _______________________________________________________________________________________________ Past Medical History:   Past Medical History:  Diagnosis Date   Alcohol abuse    Allergic rhinitis    Anxiety    With Post traumatic stress disorder   Diabetes mellitus without complication ([place])    'sometime last yr'--type 2   GERD (gastroesophageal reflux disease)    Headache    "like migraines"   Hepatitis    he thinks its hep b or c   Hyperlipidemia    Hypertension    Osteoarthritis    Pancreatitis    Rheumatoid arthritis(714.0)    Tobacco user       Past Surgical History:  Procedure Laterality Date   COLONOSCOPY     FRACTURE SURGERY     cheek bone fracture    rtc     right shoulder  -- 2010   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   Procedure: TOTAL HIP ARTHROPLASTY ANTERIOR APPROACH;  Surgeon: Renette Butters, MD;  Location: [place];  Service: Orthopedics;  Laterality: Right;   WRIST SURGERY     fusion with pins  2007    Social History:  Ambulatory  independently         reports that he has been smoking cigarettes. He has a 10.00 pack-year smoking history. He has never used smokeless tobacco. He reports current alcohol use of about 7.0 - 14.0 standard drinks of alcohol per week. He reports that he does not use drugs.    Family History:   Family History  Problem Relation Age of Onset   Kidney disease Brother    Angioedema Maternal Aunt        Tongue swelling   Heart attack  Mother    Heart attack Father    ______________________________________________________________________________________________ Allergies: Allergies  Allergen Reactions   Lisinopril Other (See Comments)    angioedema   Nsaids Other (See Comments)    Stomach Irritability      Prior to Admission medications   Medication Sig Start Date End Date Taking? Authorizing Provider  amLODipine (NORVASC) 5 MG tablet Take 1 tablet (5 mg total) by mouth daily. 02/10/19   Lanae Boast, FNP  EPINEPHrine 0.3 mg/0.3 mL IJ SOAJ injection Inject 0.3 mLs (0.3 mg total) into the muscle once as needed (For anyphylaxis or severe angioedema.). 02/10/17   Lenore Cordia, MD  gabapentin (NEURONTIN) 600 MG tablet Take 1 tablet (600 mg total) by mouth 3 (three) times daily. 02/10/19   Lanae Boast, FNP  gemfibrozil (LOPID) 600 MG tablet Take 1 tablet (600 mg total) by mouth 2 (two) times daily before a meal. 02/12/17   Dorena Dew, FNP  hydrochlorothiazide (MICROZIDE) 12.5 MG capsule Take 1 capsule (12.5  mg total) by mouth daily. 02/12/17   Dorena Dew, FNP  HYDROcodone-acetaminophen (NORCO/VICODIN) 5-325 MG tablet Take 2 tablets by mouth every 4 (four) hours as needed. Patient not taking: Reported on 09/28/2020 02/28/20   Darr, Edison Nasuti, PA-C  metFORMIN (GLUCOPHAGE) 500 MG tablet Take 1 tablet (500 mg total) by mouth daily with breakfast. 02/10/19   Lanae Boast, FNP  methocarbamol (ROBAXIN) 500 MG tablet Take 1 tablet (500 mg total) by mouth 2 (two) times daily. 09/28/20   [name], NP  neomycin-polymyxin-hydrocortisone (CORTISPORIN) OTIC solution Apply 1-2 drops to toe after soaking BID 02/24/19   Wallene Huh, DPM  omega-3 acid ethyl esters (LOVAZA) 1 g capsule Take 1 capsule (1 g total) by mouth daily. 02/10/19   Lanae Boast, FNP  ondansetron (ZOFRAN ODT) 4 MG disintegrating tablet Take 1 tablet (4 mg total) by mouth every 8 (eight) hours as needed for nausea or vomiting. 11/18/17   Street,  [place], PA-C  oxyCODONE-acetaminophen (PERCOCET/ROXICET) 5-325 MG tablet Take 1 tablet by mouth every 6 (six) hours as needed for severe pain. 09/28/20   [name], NP  triamcinolone (KENALOG) 0.025 % ointment Apply 1 application topically 2 (two) times daily. 06/12/18   Lanae Boast, [place]    ___________________________________________________________________________________________________ Physical Exam: Vitals with BMI 05/14/2021 05/14/2021 05/14/2021  Height - - -  Weight - - -  BMI - - -  Systolic 948 016 553  Diastolic 90 748 270  Pulse 73 67 76      1. General:  in No Acute distress    Chronically ill -appearing 2. Psychological: Alert and  Oriented 3. Head/ENT:    Dry Mucous Membranes                          Head Non traumatic, neck supple                          Poor Dentition 4. SKIN:  decreased Skin turgor,  Skin clean Dry and intact no rash 5. Heart: Regular rate and rhythm no  Murmur, no Rub or gallop 6. Lungs:   no wheezes or crackles   7. Abdomen: Soft,  non-tender, Non distended  bowel sounds present 8. Lower extremities: no clubbing, cyanosis, no  edema 9. Neurologically  strength 5 out of 5 in all 4 extremities cranial nerves II through XII intact 10. MSK: Normal range of motion    Chart has been reviewed  ______________________________________________________________________________________________  Assessment/Plan 65 y.o. male with medical history significant of EtOH abuse, PTSD, DM2, HLD, HTN, HepC RA, pancreatitis, tobacco abuse   Admitted for CVA  Present on Admission:  CVA (cerebral vascular accident) ([place]) -  - will admit based on TIA/CVA protocol,         Monitor on Tele       MRA/MRI  Resulted - showing acute ischemic CVA        CTA done showing no significant lesions         Echo to evaluate for possible embolic source,        obtain cardiac enzymes,  ECG,   Lipid panel, TSH.        Order PT/OT evaluation.       passes swallow eval          Will make sure patient is on antiplatelet  Plavix agent and statin        Given his stroke likely  occurred 4 days ago but will attempt to improve blood pressure control restart home medications       Neurology consulted Have seen pt in ER     Hypercholesteremia -restart home medications   Essential hypertension - restart Norvasc may need to titrate home medications   Alcohol abuse -order CIWA protocol Spoke about importance of quitting   Tobacco abuse -  - Spoke about importance of quitting spent 5 minutes discussing options for treatment, prior attempts at quitting, and dangers of smoking  -At this point patient is   interested in quitting  - order nicotine patch   - nursing tobacco cessation protocol   DM2 -  - Order Sensitive  SSI     -  check TSH and HgA1C  - Hold by mouth medications   Chronic right shoulder pain -resume home pain medications imaging showed progression of degenerative disease  Reportedly history rheumatoid arthritis not on any medication would need to further investigate diagnosis  DVT prophylaxis:  SCD       Code Status:    Code Status: Prior FULL CODE  as per patient    I had personally discussed CODE STATUS with patient     Family Communication:   Family not at  Bedside    Disposition Plan:    To home once workup is complete and patient is stable   Following barriers for discharge:                            Electrolytes corrected                                                          Will need to be able to tolerate PO                                                      Will need consultants to evaluate patient prior to discharge                    Would benefit from PT/OT eval prior to DC  Ordered                                     Transition of care consulted                     Consults called: neurology is aware  Admission status:  ED Disposition     ED Disposition  [place]: [place] [[id number]]  Level of Care: Progressive [102]  Admit to Progressive based on following criteria: NEUROLOGICAL AND NEUROSURGICAL complex patients with significant risk of instability, who do not meet ICU criteria, yet require close observation or frequent assessment (< / = every 2 - 4 hours) with medical / nursing intervention.  May admit patient to [facility] or [facility] if equivalent level of care is available:: No  Covid Evaluation: Confirmed COVID Negative  Diagnosis: CVA (cerebral  vascular accident) [facility]) [[id number]]  Admitting Physician: Toy Baker [[id number]]  Attending Physician: Toy Baker [[id number]]  Estimated length of stay: past midnight tomorrow  Certification:: I certify this patient will need inpatient services for at least 2 midnights             inpatient     I Expect 2 midnight stay secondary to severity of patient's current illness need for inpatient interventions justified by the following:  hemodynamic instability despite optimal treatment (hypertension)  Severe lab/radiological/exam abnormalities including:    New CVA  and extensive comorbidities including:  substance abuse   Chronic pain  DM2     That are currently affecting medical management.   I expect  patient to be hospitalized for 2 midnights requiring inpatient medical care.  Patient is at high risk for adverse outcome (such as loss of life or disability) if not treated.  Indication for inpatient stay as follows:   Need for  V fluids      Level of care   progressive  tele indefinitely please discontinue once patient no longer qualifies COVID-19 Labs    Lab Results  Component Value Date   [place] NEGATIVE 05/14/2021     Precautions: admitted as Covid Negative     PPE: Used by the provider:   N95  eye Goggles,  Gloves    Maximum Reiland 05/15/2021, 1:48 AM    [facility]     after 2 AM please page floor coverage PA If 7AM-7PM,  please contact the day team taking care of the patient using [email or web address]   Patient was evaluated in the context of the global COVID-19 pandemic, which necessitated consideration that the patient might be at risk for infection with the SARS-CoV-2 virus that causes COVID-19. Institutional protocols and algorithms that pertain to the evaluation of patients at risk for COVID-19 are in a state of rapid change based on information released by regulatory bodies including the CDC and federal and state organizations. These policies and algorithms were followed during the patient's care.

## 2021-05-14 NOTE — ED Provider Notes (Signed)
Right arm [facility] EMERGENCY DEPARTMENT Provider Note   CSN: [id number] Arrival date & time: 05/14/21  1206     History Chief Complaint  Patient presents with   Extremity Weakness   Aphasia    [name] is a 65 y.o. male.   Extremity Weakness   Patient presented to the ED for evaluation of weakness that started about 4 days ago.  Patient states he has history of chronic shoulder problems and always has some limitations in movement of his right arm but he felt it was worse a few days ago.  Friends also told him that his speech was a bit slurred and that he had a facial droop.  Those symptoms have resolved but he continues to have weakness in his arm.  Patient denies any fevers or chills.  No vomiting or diarrhea.  Patient does chronically drink alcohol regularly.  Past Medical History:  Diagnosis Date   Alcohol abuse    Allergic rhinitis    Anxiety    With Post traumatic stress disorder   Diabetes mellitus without complication ([place])    'sometime last yr'--type 2   GERD (gastroesophageal reflux disease)    Headache    "like migraines"   Hepatitis    he thinks its hep b or c   Hyperlipidemia    Hypertension    Osteoarthritis    Pancreatitis    Rheumatoid arthritis(714.0)    Tobacco user     Patient Active Problem List   Diagnosis Date Noted   Angioedema 02/09/2017   Primary localized osteoarthritis of right hip 01/02/2016   Groin rash 81/10/7508   DM w/o complication type II ([place]) 06/16/2013   BPH (benign prostatic hyperplasia) 11/24/2012   Healthcare maintenance 11/24/2012   Pancreatitis 10/31/2012   Chronic pain syndrome 09/18/2011   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/19/2011   Right rotator cuff tear 07/26/2011   Shoulder pain, right 05/03/2011   Right ankle pain 01/23/2011   Left wrist pain 01/23/2011   Back pain 01/02/2011   MOLLUSCUM CONTAGIOSUM 12/05/2008   LEUKOCYTOSIS UNSPECIFIED 03/25/2008   LIVER FUNCTION TESTS, ABNORMAL 03/25/2008    ERECTILE DYSFUNCTION 05/12/2007   Essential hypertension 03/02/2007   HYPERLIPIDEMIA 01/08/2007   ANXIETY 01/08/2007   GERD 01/08/2007   RHEUMATOID ARTHRITIS 01/08/2007   OSTEOARTHRITIS 01/08/2007    Past Surgical History:  Procedure Laterality Date   COLONOSCOPY     FRACTURE SURGERY     cheek bone fracture    rtc     right shoulder  -- 2010   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   Procedure: TOTAL HIP ARTHROPLASTY ANTERIOR APPROACH;  Surgeon: Renette Butters, MD;  Location: [place];  Service: Orthopedics;  Laterality: Right;   WRIST SURGERY     fusion with pins  2007       Family History  Problem Relation Age of Onset   Kidney disease Brother    Angioedema Maternal Aunt        Tongue swelling   Heart attack Mother    Heart attack Father     Social History   Tobacco Use   Smoking status: Every Day    Packs/day: 0.40    Years: 25.00    Pack years: 10.00    Types: Cigarettes   Smokeless tobacco: Never   Tobacco comments:    trying  Vaping Use   Vaping Use: Never used  Substance Use Topics   Alcohol use: Yes    Alcohol/week: 7.0 - 14.0 standard drinks  Types: 7 - 14 Cans of beer per week    Comment: 1-2 beers occasionally   Drug use: No    Home Medications Prior to Admission medications   Medication Sig Start Date End Date Taking? Authorizing Provider  amLODipine (NORVASC) 5 MG tablet Take 1 tablet (5 mg total) by mouth daily. 02/10/19   Lanae Boast, FNP  EPINEPHrine 0.3 mg/0.3 mL IJ SOAJ injection Inject 0.3 mLs (0.3 mg total) into the muscle once as needed (For anyphylaxis or severe angioedema.). 02/10/17   Lenore Cordia, MD  gabapentin (NEURONTIN) 600 MG tablet Take 1 tablet (600 mg total) by mouth 3 (three) times daily. 02/10/19   Lanae Boast, FNP  gemfibrozil (LOPID) 600 MG tablet Take 1 tablet (600 mg total) by mouth 2 (two) times daily before a meal. 02/12/17   Dorena Dew, FNP  hydrochlorothiazide (MICROZIDE) 12.5 MG capsule Take 1 capsule  (12.5 mg total) by mouth daily. 02/12/17   Dorena Dew, FNP  HYDROcodone-acetaminophen (NORCO/VICODIN) 5-325 MG tablet Take 2 tablets by mouth every 4 (four) hours as needed. Patient not taking: Reported on 09/28/2020 02/28/20   Darr, Edison Nasuti, PA-C  metFORMIN (GLUCOPHAGE) 500 MG tablet Take 1 tablet (500 mg total) by mouth daily with breakfast. 02/10/19   Lanae Boast, FNP  methocarbamol (ROBAXIN) 500 MG tablet Take 1 tablet (500 mg total) by mouth 2 (two) times daily. 09/28/20   [name], NP  neomycin-polymyxin-hydrocortisone (CORTISPORIN) OTIC solution Apply 1-2 drops to toe after soaking BID 02/24/19   Wallene Huh, DPM  omega-3 acid ethyl esters (LOVAZA) 1 g capsule Take 1 capsule (1 g total) by mouth daily. 02/10/19   Lanae Boast, FNP  ondansetron (ZOFRAN ODT) 4 MG disintegrating tablet Take 1 tablet (4 mg total) by mouth every 8 (eight) hours as needed for nausea or vomiting. 11/18/17   Street, [place], PA-C  oxyCODONE-acetaminophen (PERCOCET/ROXICET) 5-325 MG tablet Take 1 tablet by mouth every 6 (six) hours as needed for severe pain. 09/28/20   [name], NP  triamcinolone (KENALOG) 0.025 % ointment Apply 1 application topically 2 (two) times daily. 06/12/18   Lanae Boast, FNP    Allergies    Lisinopril and Nsaids  Review of Systems   Review of Systems  Musculoskeletal:  Positive for extremity weakness.  All other systems reviewed and are negative.  Physical Exam Updated Vital Signs BP (!) 195/96   Pulse 69   Temp 98.2 F (36.8 C) (Oral)   Resp 17   Ht 1.702 m (5\' 7" )   Wt 64.5 kg   SpO2 100%   BMI 22.27 kg/m   Physical Exam Vitals and nursing note reviewed.  Constitutional:      General: He is not in acute distress.    Appearance: He is well-developed.  HENT:     Head: Normocephalic and atraumatic.     Right Ear: External ear normal.     Left Ear: External ear normal.  Eyes:     General: No scleral icterus.       Right eye: No discharge.         Left eye: No discharge.     Conjunctiva/sclera: Conjunctivae normal.  Neck:     Trachea: No tracheal deviation.  Cardiovascular:     Rate and Rhythm: Normal rate and regular rhythm.  Pulmonary:     Effort: Pulmonary effort is normal. No respiratory distress.     Breath sounds: Normal breath sounds. No stridor. No wheezing or rales.  Abdominal:     General: Bowel sounds are normal. There is no distension.     Palpations: Abdomen is soft.     Tenderness: There is no abdominal tenderness. There is no guarding or rebound.  Musculoskeletal:        General: No tenderness.     Cervical back: Neck supple.  Skin:    General: Skin is warm and dry.     Findings: No rash.  Neurological:     Mental Status: He is alert and oriented to person, place, and time.     Cranial Nerves: No cranial nerve deficit (No facial droop, extraocular movements intact, tongue midline ).     Sensory: No sensory deficit.     Motor: No abnormal muscle tone or seizure activity.     Coordination: Coordination normal.     Comments: Good right grip strength, difficulty lifting right arm up at the shoulder level,  able to hold both legs off bed for 5 seconds, sensation intact in all extremities, no visual field cuts, no left or right sided neglect, normal finger-nose exam bilaterally, no nystagmus noted     ED Results / Procedures / Treatments   Labs (all labs ordered are listed, but only abnormal results are displayed) Labs Reviewed  ETHANOL - Abnormal; Notable for the following components:      Result Value   Alcohol, Ethyl (B) 56 (*)    All other components within normal limits  CBC - Abnormal; Notable for the following components:   WBC 10.7 (*)    All other components within normal limits  DIFFERENTIAL - Abnormal; Notable for the following components:   Lymphs Abs 4.2 (*)    All other components within normal limits  COMPREHENSIVE METABOLIC PANEL - Abnormal; Notable for the following components:   Sodium 134  (*)    All other components within normal limits  RESP PANEL BY RT-PCR (FLU A&B, COVID) ARPGX2  PROTIME-INR  APTT  RAPID URINE DRUG SCREEN, HOSP PERFORMED  URINALYSIS, ROUTINE W REFLEX MICROSCOPIC  I-STAT CHEM 8, ED    EKG None  Radiology DG Shoulder Right  Result Date: 05/14/2021 CLINICAL DATA:  Right shoulder pain. EXAM: RIGHT SHOULDER - 2+ VIEW COMPARISON:  Right shoulder radiographs 01/07/2017 FINDINGS: Progressive advanced degenerative changes are present in the right shoulder. Acromial spurring noted. Visualized hemithorax clear. IMPRESSION: Progressive advanced degenerative changes of the right shoulder. Electronically Signed   By: San Morelle M.D.   On: 05/14/2021 13:36   CT HEAD WO CONTRAST  Result Date: 05/14/2021 CLINICAL DATA:  Neuro deficit, acute, stroke suspected. Additional provided: Aphasia, extremity weakness. EXAM: CT HEAD WITHOUT CONTRAST TECHNIQUE: Contiguous axial images were obtained from the base of the skull through the vertex without intravenous contrast. COMPARISON:  CT head/cervical spine 01/07/2017. FINDINGS: Brain: Cerebral volume is normal. Mild patchy and ill-defined hypoattenuation within the cerebral white matter, nonspecific but compatible with chronic small vessel ischemic disease. There is no acute intracranial hemorrhage. No demarcated cortical infarct. No extra-axial fluid collection. No evidence of an intracranial mass. No midline shift. Vascular: No hyperdense vessel.  Atherosclerotic calcifications. Skull: Normal. Negative for fracture or focal lesion. Sinuses/Orbits: Visualized orbits show no acute finding. Chronic inferiorly displaced fracture deformity of the right orbital floor. Small volume frothy secretions within a posterior right ethmoid air cell. IMPRESSION: No evidence of acute intra abnormality. Mild chronic small vessel ischemic changes within the cerebral white matter. Redemonstrated chronic inferiorly displaced fracture deformity of  the right orbital floor. Mild right  ethmoid sinusitis. Electronically Signed   By: Kellie Simmering DO   On: 05/14/2021 14:59    Procedures Procedures   Medications Ordered in ED Medications - No data to display  ED Course  I have reviewed the triage vital signs and the nursing notes.  Pertinent labs & imaging results that were available during my care of the patient were reviewed by me and considered in my medical decision making (see chart for details).    MDM Rules/Calculators/A&P                          Patient presents ED with concerns of possible stroke.  On exam patient does not have any facial droop.  No slurred speech.  He has good strength in his lower extremities.  He has good grip strength bilaterally.  Patient's x-rays do show evidence of significant arthritis in his right shoulder.  I suspect this could be the main issue with his right arm.  With his complaints we will plan on doing an MRI to make sure he does not have any occult stroke. Final Clinical Impression(s) / ED Diagnoses Final diagnoses:  Arthritis of shoulder     Dorie Rank, MD 05/14/21 1541

## 2021-05-14 NOTE — ED Provider Notes (Signed)
Emergency Medicine Provider Triage Evaluation Note  [name] , a 65 y.o. male  was evaluated in triage.  Pt complains of slurred speech, facial droop, and right arm weakness x4 days. Slurred speech and facial droop resolved; however right upper extremity weakness has persisted. No history of CVA. Admits to chronic right shoulder issues. He notes his slurred speech and facial droop could have been related to alcohol intake because he was at a family reunion. Admits to drinking roughly 5 drinks daily. Drank earlier today.  Review of Systems  Positive: Weakness Negative: fever  Physical Exam  BP (!) 151/84 (BP Location: Right Arm)   Pulse 75   Temp 98.2 F (36.8 C) (Oral)   Resp 17   Ht 5\' 7"  (1.702 m)   Wt 64.5 kg   SpO2 98%   BMI 22.27 kg/m  Gen:   Awake, no distress   Resp:  Normal effort  MSK:   Moves extremities without difficulty  Other:  RUE drift. No facial droop. No slurred speech  Medical Decision Making  Medically screening exam initiated at 12:47 PM.  Appropriate orders placed.  RENNER SEBALD was informed that the remainder of the evaluation will be completed by another provider, this initial triage assessment does not replace that evaluation, and the importance of remaining in the ED until their evaluation is complete.  Stroke labs ordered. Patient out of TPA window as symptoms started 4 days ago. RUE weakness could be related to chronic shoulder issues, x-ray ordered.    Suzy Bouchard, PA-C 05/14/21 [place], [place], MD 05/24/21 [phone]

## 2021-05-14 NOTE — ED Notes (Signed)
Dr.Floyd notified of pt elevated bp. Pt remains alert and oriented x 4. No acute distress.

## 2021-05-14 NOTE — ED Notes (Signed)
Pt transported to CT at this time.

## 2021-05-14 NOTE — ED Notes (Signed)
Paged MD about pt requesting more pain medication and SBP 200. MD at bedside.

## 2021-05-14 NOTE — ED Provider Notes (Signed)
I see the patient in signout from Dr. Tomi Bamberger, briefly the patient is a 65 year old male with a chief complaints of right arm weakness going on for about 4 days now.  Plan to obtain MRI and reassess.  I received a call from the radiologist and we discussed his MRI images, shows an acute pontine stroke.  Discussed with Dr. Quinn Axe, neurology will come and evaluate at bedside recommended medical admission.   Deno Etienne, DO 05/14/21 1742

## 2021-05-14 NOTE — ED Notes (Signed)
Taken to MRI by transporter at this time.  

## 2021-05-14 NOTE — ED Notes (Signed)
[name] (Brother) called asking for every update the number is [phone]

## 2021-05-15 ENCOUNTER — Inpatient Hospital Stay (HOSPITAL_COMMUNITY): Payer: Medicaid Other

## 2021-05-15 DIAGNOSIS — I6389 Other cerebral infarction: Secondary | ICD-10-CM

## 2021-05-15 DIAGNOSIS — I6300 Cerebral infarction due to thrombosis of unspecified precerebral artery: Secondary | ICD-10-CM

## 2021-05-15 LAB — COMPREHENSIVE METABOLIC PANEL
ALT: 24 U/L (ref 0–44)
AST: 34 U/L (ref 15–41)
Albumin: 3.5 g/dL (ref 3.5–5.0)
Alkaline Phosphatase: 63 U/L (ref 38–126)
Anion gap: 6 (ref 5–15)
BUN: 6 mg/dL — ABNORMAL LOW (ref 8–23)
CO2: 25 mmol/L (ref 22–32)
Calcium: 9.3 mg/dL (ref 8.9–10.3)
Chloride: 101 mmol/L (ref 98–111)
Creatinine, Ser: 0.73 mg/dL (ref 0.61–1.24)
GFR, Estimated: >60 mL/min (ref >60)
Glucose, Bld: 143 mg/dL — ABNORMAL HIGH (ref 70–99)
Potassium: 3.3 mmol/L — ABNORMAL LOW (ref 3.5–5.1)
Sodium: 132 mmol/L — ABNORMAL LOW (ref 135–145)
Total Bilirubin: 1.2 mg/dL (ref 0.3–1.2)
Total Protein: 7.3 g/dL (ref 6.5–8.1)

## 2021-05-15 LAB — CBC WITH DIFFERENTIAL/PLATELET
Abs Immature Granulocytes: 0.03 10*3/uL (ref 0.00–0.07)
Basophils Absolute: 0 10*3/uL (ref 0.0–0.1)
Basophils Relative: 0 %
Eosinophils Absolute: 0.1 10*3/uL (ref 0.0–0.5)
Eosinophils Relative: 1 %
HCT: 47.2 % (ref 39.0–52.0)
Hemoglobin: 16.7 g/dL (ref 13.0–17.0)
Immature Granulocytes: 0 %
Lymphocytes Relative: 42 %
Lymphs Abs: 4.2 10*3/uL — ABNORMAL HIGH (ref 0.7–4.0)
MCH: 33.5 pg (ref 26.0–34.0)
MCHC: 35.4 g/dL (ref 30.0–36.0)
MCV: 94.6 fL (ref 80.0–100.0)
Monocytes Absolute: 0.8 10*3/uL (ref 0.1–1.0)
Monocytes Relative: 8 %
Neutro Abs: 5 10*3/uL (ref 1.7–7.7)
Neutrophils Relative %: 49 %
Platelets: 211 10*3/uL (ref 150–400)
RBC: 4.99 MIL/uL (ref 4.22–5.81)
RDW: 12 % (ref 11.5–15.5)
WBC: 10.1 10*3/uL (ref 4.0–10.5)
nRBC: 0 % (ref 0.0–0.2)

## 2021-05-15 LAB — CBG MONITORING, ED
Glucose-Capillary: 110 mg/dL — ABNORMAL HIGH (ref 70–99)
Glucose-Capillary: 116 mg/dL — ABNORMAL HIGH (ref 70–99)
Glucose-Capillary: 126 mg/dL — ABNORMAL HIGH (ref 70–99)
Glucose-Capillary: 133 mg/dL — ABNORMAL HIGH (ref 70–99)

## 2021-05-15 LAB — GLUCOSE, CAPILLARY
Glucose-Capillary: 103 mg/dL — ABNORMAL HIGH (ref 70–99)
Glucose-Capillary: 121 mg/dL — ABNORMAL HIGH (ref 70–99)
Glucose-Capillary: 140 mg/dL — ABNORMAL HIGH (ref 70–99)

## 2021-05-15 LAB — ECHOCARDIOGRAM COMPLETE BUBBLE STUDY
Area-P 1/2: 2.83 cm2
S' Lateral: 2.9 cm

## 2021-05-15 LAB — HEMOGLOBIN A1C
Hgb A1c MFr Bld: 5.9 % — ABNORMAL HIGH (ref 4.8–5.6)
Mean Plasma Glucose: 122.63 mg/dL

## 2021-05-15 LAB — LIPID PANEL
Cholesterol: 175 mg/dL (ref 0–200)
HDL: 47 mg/dL (ref >40)
LDL Cholesterol: 76 mg/dL (ref 0–99)
Total CHOL/HDL Ratio: 3.7 RATIO
Triglycerides: 258 mg/dL — ABNORMAL HIGH (ref <150)
VLDL: 52 mg/dL — ABNORMAL HIGH (ref 0–40)

## 2021-05-15 MED ORDER — ATORVASTATIN CALCIUM 40 MG PO TABS
40.0000 mg | ORAL_TABLET | Freq: Every day | ORAL | Status: DC
  Administered 2021-05-15 – 2021-05-16 (×2): 40 mg via ORAL
  Filled 2021-05-15 (×2): qty 1

## 2021-05-15 MED ORDER — AMLODIPINE BESYLATE 5 MG PO TABS
5.0000 mg | ORAL_TABLET | Freq: Every day | ORAL | Status: DC
  Administered 2021-05-15 – 2021-05-16 (×3): 5 mg via ORAL
  Filled 2021-05-15 (×3): qty 1

## 2021-05-15 MED ORDER — AMLODIPINE BESYLATE 5 MG PO TABS: 5.0000 mg | ORAL_TABLET | Freq: Every day | ORAL | Status: DC

## 2021-05-15 NOTE — Progress Notes (Addendum)
STROKE TEAM PROGRESS NOTE   INTERVAL HISTORY No acute complaints this morning. Reviewed results of MRI with him.  Encouraged smoking cessation.  Blood pressure is stable.   CBC:  Recent Labs  Lab 05/14/21 1321 05/15/21 0902  WBC 10.7* 10.1  NEUTROABS 5.7 5.0  HGB 16.6 16.7  HCT 48.0 47.2  MCV 95.6 94.6  PLT 220 193   Basic Metabolic Panel:  Recent Labs  Lab 05/14/21 1321 05/14/21 1957 05/15/21 0902  NA 134*  --  132*  K 3.7  --  3.3*  CL 101  --  101  CO2 22  --  25  GLUCOSE 98  --  143*  BUN 8  --  6*  CREATININE 0.75  --  0.73  CALCIUM 9.4  --  9.3  MG  --  1.9  --   PHOS  --  3.4  --    Lipid Panel:  Recent Labs  Lab 05/15/21 0500  CHOL 175  TRIG 258*  HDL 47  CHOLHDL 3.7  VLDL 52*  LDLCALC 76   HgbA1c:  Recent Labs  Lab 05/15/21 0902  HGBA1C 5.9*   Urine Drug Screen:  Recent Labs  Lab 05/14/21 1700  LABOPIA NONE DETECTED  COCAINSCRNUR NONE DETECTED  LABBENZ NONE DETECTED  AMPHETMU NONE DETECTED  THCU POSITIVE*  LABBARB NONE DETECTED    Alcohol Level  Recent Labs  Lab 05/14/21 1321  ETH 56*    IMAGING past 24 hours CT ANGIO HEAD NECK W WO CM  Result Date: 05/14/2021 CLINICAL DATA:  Slurred speech and right-sided weakness. Pontine infarct EXAM: CT ANGIOGRAPHY HEAD AND NECK TECHNIQUE: Multidetector CT imaging of the head and neck was performed using the standard protocol during bolus administration of intravenous contrast. Multiplanar CT image reconstructions and MIPs were obtained to evaluate the vascular anatomy. Carotid stenosis measurements (when applicable) are obtained utilizing NASCET criteria, using the distal internal carotid diameter as the denominator. CONTRAST:  19mL OMNIPAQUE IOHEXOL 350 MG/ML SOLN COMPARISON:  Brain MRI 05/14/2021 FINDINGS: CTA NECK FINDINGS SKELETON: There is no bony spinal canal stenosis. No lytic or blastic lesion. OTHER NECK: Normal pharynx, larynx and major salivary glands. No cervical lymphadenopathy.  Unremarkable thyroid gland. UPPER CHEST: No pneumothorax or pleural effusion. No nodules or masses. AORTIC ARCH: There is no calcific atherosclerosis of the aortic arch. There is no aneurysm, dissection or hemodynamically significant stenosis of the visualized portion of the aorta. Conventional 3 vessel aortic branching pattern. The visualized proximal subclavian arteries are widely patent. RIGHT CAROTID SYSTEM: No dissection, occlusion or aneurysm. There is mixed density atherosclerosis extending into the proximal ICA, resulting in less than 50% stenosis. LEFT CAROTID SYSTEM: No dissection, occlusion or aneurysm. There is mixed density atherosclerosis extending into the proximal ICA, resulting in less than 50% stenosis. VERTEBRAL ARTERIES: Left dominant configuration. Atherosclerosis at both vertebral artery origins. There is no dissection, occlusion or flow-limiting stenosis to the skull base (V1-V3 segments). CTA HEAD FINDINGS POSTERIOR CIRCULATION: --Vertebral arteries: Normal V4 segments. --Inferior cerebellar arteries: Normal. --Basilar artery: Normal. --Superior cerebellar arteries: Normal. --Posterior cerebral arteries (PCA): Normal. ANTERIOR CIRCULATION: --Intracranial internal carotid arteries: Normal. --Anterior cerebral arteries (ACA): Normal. Both A1 segments are present. Patent anterior communicating artery (a-comm). --Middle cerebral arteries (MCA): Normal. VENOUS SINUSES: As permitted by contrast timing, patent. ANATOMIC VARIANTS: Both posterior communicating arteries are patent. Review of the MIP images confirms the above findings. IMPRESSION: 1. No intracranial arterial occlusion or high-grade stenosis. 2. Bilateral carotid bifurcation atherosclerosis with less than 50%  stenosis. Electronically Signed   By: Ulyses Jarred M.D.   On: 05/14/2021 20:10   CT HEAD WO CONTRAST  Result Date: 05/14/2021 CLINICAL DATA:  Neuro deficit, acute, stroke suspected. Additional provided: Aphasia, extremity  weakness. EXAM: CT HEAD WITHOUT CONTRAST TECHNIQUE: Contiguous axial images were obtained from the base of the skull through the vertex without intravenous contrast. COMPARISON:  CT head/cervical spine 01/07/2017. FINDINGS: Brain: Cerebral volume is normal. Mild patchy and ill-defined hypoattenuation within the cerebral white matter, nonspecific but compatible with chronic small vessel ischemic disease. There is no acute intracranial hemorrhage. No demarcated cortical infarct. No extra-axial fluid collection. No evidence of an intracranial mass. No midline shift. Vascular: No hyperdense vessel.  Atherosclerotic calcifications. Skull: Normal. Negative for fracture or focal lesion. Sinuses/Orbits: Visualized orbits show no acute finding. Chronic inferiorly displaced fracture deformity of the right orbital floor. Small volume frothy secretions within a posterior right ethmoid air cell. IMPRESSION: No evidence of acute intra abnormality. Mild chronic small vessel ischemic changes within the cerebral white matter. Redemonstrated chronic inferiorly displaced fracture deformity of the right orbital floor. Mild right ethmoid sinusitis. Electronically Signed   By: Kellie Simmering DO   On: 05/14/2021 14:59   MR BRAIN WO CONTRAST  Result Date: 05/14/2021 CLINICAL DATA:  Neuro deficit, acute, stroke suspected. Right upper extremity weakness beginning 4 days ago. EXAM: MRI HEAD WITHOUT CONTRAST TECHNIQUE: Multiplanar, multiecho pulse sequences of the brain and surrounding structures were obtained without intravenous contrast. COMPARISON:  CT head without contrast 05/14/2021. FINDINGS: Brain: Diffusion-weighted images demonstrate no acute nonhemorrhagic infarct in the left pons measuring up to 8.5 mm. T2 and FLAIR hyperintensities associated with the infarct. Acute hemorrhage is present. Periventricular and subcortical white matter changes are moderately advanced for age. Remote lacunar infarct is present in the right thalamus.  Susceptibility artifact extending into the posterior right lentiform nucleus likely represents developmental venous anomaly. Susceptibility posteromedial right cerebellum compatible with remote hemorrhagic infarct. Brainstem and cerebellum are otherwise within normal limits. The internal auditory canals are within normal limits. The ventricles are of normal size. No significant extraaxial fluid collection is present. Vascular: Flow is present in the major intracranial arteries. Skull and upper cervical spine: Degenerative changes noted at C3-4 with sclerotic endplate change. Marrow signal otherwise normal. Central canal stenosis is present at C3-4. Craniocervical junction normal. Midline structures otherwise within limits. Sinuses/Orbits: The paranasal sinuses and mastoid air cells are clear. The globes and orbits are within normal limits. IMPRESSION: 1. Acute/subacute 8.5 mm nonhemorrhagic infarct in the left pons. 2. Remote nonhemorrhagic lacunar infarct of the right thalamus. 3. Remote hemorrhagic infarct in the posterior right cerebellum. 4. Atrophy and white matter disease is moderately advanced for age. This likely reflects the sequela of chronic microvascular ischemia. 5. Degenerative changes in the cervical spine with central canal stenosis at C3-4. These results were called by telephone at the time of interpretation on 05/14/2021 at 5:24 pm to provider Deno Etienne, who verbally acknowledged these results. Electronically Signed   By: San Morelle M.D.   On: 05/14/2021 17:29   DG Chest Port 1 View  Result Date: 05/14/2021 CLINICAL DATA:  Stroke EXAM: PORTABLE CHEST 1 VIEW COMPARISON:  09/11/2020 FINDINGS: The heart size and mediastinal contours are within normal limits. Both lungs are clear. The visualized skeletal structures are unremarkable. IMPRESSION: No active disease. Electronically Signed   By: Donavan Foil M.D.   On: 05/14/2021 22:07     Physical Examination: Temp:  [98 F (36.7 C)-98.1  F (  36.7 C)] 98 F (36.7 C) (07/18 1824) Pulse Rate:  [51-76] 65 (07/19 1145) Resp:  [15-23] 21 (07/19 1145) BP: (154-204)/(69-102) 174/76 (07/19 1145) SpO2:  [93 %-100 %] 97 % (07/19 1145) Weight:  [64.5 kg] 64.5 kg (07/18 1245)   General - Well nourished, well developed, in no apparent distress.   Ophthalmologic - no conjunctival injection or scleral icterus   Cardiovascular - Regular rate and rhythm.   Mental Status - Level of arousal and orientation to time, place, and person were intact. Language including expression, naming, repetition, comprehension was assessed and found intact. Attention span and concentration were normal. Recent and remote memory were intact. Fund of Knowledge was assessed and was intact.   Cranial Nerves II - XII - II - Visual field intact. III, IV, VI - Extraocular movements intact. V - Facial sensation intact bilaterally. VII - Facial movement intact bilaterally. VIII - Hearing & vestibular intact bilaterally. X - Palate elevates symmetrically. XI - Chin turning & shoulder shrug intact bilaterally. XII - Tongue midline.   Motor Strength - Chronic right shoulder pain limited ability for complete examination. 5/5 strength of all extremities with slight notable weakness of the right upper.  Bulk was normal and fasciculations were absent.  Diminished fine finger movements on the right.  Mild right grip weakness.  Orbits left over right upper extremity.  Mild right hip flexor and ankle dorsiflexor weakness. Motor Tone - Muscle tone was assessed at the neck and appendages and was normal.   Reflexes - The patient's reflexes were symmetrical in all extremities and he had no pathological reflexes.   Sensory - Sensation intact throughout   Coordination - The patient had normal movements in the hands and feet with no ataxia or dysmetria.  Tremor was absent.   Gait and Station - deferred.    Assessment:  [name] is an 65 y.o. African American male  with hypertension, hyperlipidemia, type 2 diabetes, alcohol use disorder, and tobacco use disorder who presented to the ED for a 4 day history of slurred speech, right sided weakness and right facial droop. He was found to have a subacute infarct of the left pons. tPA was not given as he was outside the treatment window.   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 Small vessel disease. ASPECTS 10.    CTA head & neck: no intracranial arterial occlusion MRI: 8.41mm infarct in the left pons. Remote lacunar infarct of the right thalmus. Remote hemorrhagic infarct in the posterior right cerebellum.  2D Echo pending LDL 76 HgbA1c 5.9  Diet: carb modified Plavix and aspirin for 21 days, followed by plavix alone Start VTE prophylaxis Therapy recommendations:  pending Disposition:  pending therapy recommendations   Hypertension Outside the window for permissive hypertension. Blood pressure goal should be normotensive   Hyperlipidemia Home meds:  gemfibrozil LDL 76, goal < 70 Now on atorvastatin 40mg    Type 2 Diabetes Mellitus HgbA1c 5.9, goal < 7.0 Resume home dose of metformin   Other Stroke Risk Factors Cigarette smoker. advised to stop smoking ETOH use Hx stroke/TIA   Other Active Problems Alcohol use disorder--CIWA. Encourage cessation Marijuana use disorder--encourage cessation   Hospital day # 1    To contact Stroke Continuity provider, please refer to [email or web address] After hours, contact General Neurology    Mitzi Hansen, MD Internal Medicine Resident PGY-3 [facility] Pager: [phone] 05/15/2021 2:26 PM  Stroke MD note: I have personally obtained history,examined this patient, reviewed notes, independently viewed imaging studies, participated in medical decision making and plan of care.ROS completed by me personally and pertinent positives fully documented  I  have made any additions or clarifications directly to the above note. Agree with note above.  Patient presented with dizziness and right-sided weakness last few days and MRI shows left paramedian pontine infarct likely from small vessel disease.  Recommend aspirin and Plavix for 3 weeks followed by Plavix alone and aggressive risk factor modification.  Patient counseled to quit smoking cigarettes and marijuana.  Check echocardiogram results.  Therapy consults.  Greater than 50% time during this 35-minute visit was spent on counseling and coordination of care and answering questions and discussion with care team. Antony Contras, MD Medical Director [facility] Pager: [phone] 05/15/2021 3:05 PM

## 2021-05-15 NOTE — ED Notes (Signed)
Echo at bedside

## 2021-05-15 NOTE — Progress Notes (Signed)
MD asked this RN to call ED social worker  [facility]. To arrange for home health. Spoke with Education officer, museum about pt needing home health.

## 2021-05-15 NOTE — Progress Notes (Signed)
Pt brought on unit. Pt oriented to with be locked in the lowest position with the call bell within reach.

## 2021-05-15 NOTE — Evaluation (Signed)
Physical Therapy Evaluation Patient Details Name: [name] MRN: [id number] DOB: 1956/01/16 Today's Date: 05/15/2021   History of Present Illness  65 yo male with onset of R side weakness was referred to PT for mobility, and noted his tendency to list back and to R side with balance challenges.  Pt was diagnosed with L pons infarct, with additional R shoulder DJD, but baseline was independent gait with no AD.  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  Pt is going home and will benefit from having his family assist him there, so if this is not available, recommend redirecting him to short stay rehab.  For now should be able to walk and challenge balance skills.  Pt is unable to stand on one LE, cannot stand tandem for 30 seconds, and cannot stand unsupervised when working with feet touching.  Follow up on balance, strength and control of gait to increase safety and independence, as well as drop fall risk.    Follow Up Recommendations Home health PT;Supervision for mobility/OOB    Equipment Recommendations  Rolling walker with 5" wheels    Recommendations for Other Services       Precautions / Restrictions Precautions Precautions: Fall Precaution Comments: R side weaker Restrictions Weight Bearing Restrictions: No      Mobility  Bed Mobility Overal bed mobility: Needs Assistance Bed Mobility: Supine to Sit;Sit to Supine     Supine to sit: Min guard Sit to supine: Min guard        Transfers Overall transfer level: Needs assistance Equipment used: 1 person hand held assist                Ambulation/Gait Ambulation/Gait assistance: Min guard Gait Distance (Feet): 150 Feet Assistive  device: 1 person hand held assist Gait Pattern/deviations: Step-through pattern;Narrow base of support;Decreased stride length Gait velocity: reduced Gait velocity interpretation: <1.31 ft/sec, indicative of household ambulator General Gait Details: pt is walking in ED with min guard and very occasional LOB back or to R  Stairs            Wheelchair Mobility    Modified Rankin (Stroke Patients Only) Modified Rankin (Stroke Patients Only) Pre-Morbid Rankin Score: No symptoms Modified Rankin: Moderate disability     Balance Overall balance assessment: Needs assistance Sitting-balance support: Feet supported Sitting balance-Leahy Scale: Good     Standing balance support: Single extremity supported Standing balance-Leahy Scale: Fair                               Pertinent Vitals/Pain Pain Assessment: No/denies pain    Home Living Family/patient expects to be discharged to:: Private residence Living Arrangements: Alone (nearby family) Available Help at Discharge: Family;Friend(s);Available PRN/intermittently Type of Home: House Home Access: Level entry     Home Layout: One level Home Equipment: Cane - single point Additional Comments: has interest in trying a rollator    Prior Function Level of Independence: Independent;Independent with assistive device(s)         Comments: walking with no AD much of the time     Hand Dominance   Dominant Hand: Right    Extremity/Trunk Assessment  Upper Extremity Assessment Upper Extremity Assessment: Generalized weakness    Lower Extremity Assessment Lower Extremity Assessment: RLE deficits/detail RLE Deficits / Details: RLE 3+ to 4+ strength RLE Coordination: WNL       Communication   Communication: No difficulties  Cognition Arousal/Alertness: Awake/alert Behavior During Therapy: Impulsive (at times) Overall Cognitive Status: No family/caregiver present to determine baseline cognitive  functioning                                 General Comments: pt had moments of LOB but is not aware of this as a concern      General Comments General comments (skin integrity, edema, etc.): Pt was seen for mobility and walks a longer trip, but is not safe to navigate alone    Exercises     Assessment/Plan    PT Assessment Patient needs continued PT services  PT Problem List Decreased strength;Decreased activity tolerance;Decreased balance;Decreased mobility;Decreased coordination;Decreased cognition;Decreased knowledge of use of DME;Decreased safety awareness;Cardiopulmonary status limiting activity       PT Treatment Interventions      PT Goals (Current goals can be found in the Care Plan section)  Acute Rehab PT Goals Patient Stated Goal: to walk alone and get home PT Goal Formulation: With patient Time For Goal Achievement: 05/29/21 Potential to Achieve Goals: Good    Frequency Min 4X/week   Barriers to discharge        Co-evaluation               AM-PAC PT "6 Clicks" Mobility  Outcome Measure Help needed turning from your back to your side while in a flat bed without using bedrails?: None Help needed moving from lying on your back to sitting on the side of a flat bed without using bedrails?: None Help needed moving to and from a bed to a chair (including a wheelchair)?: A Little Help needed standing up from a chair using your arms (e.g., wheelchair or bedside chair)?: A Little Help needed to walk in hospital room?: A Little Help needed climbing 3-5 steps with a railing? : A Little 6 Click Score: 20    End of Session Equipment Utilized During Treatment: Gait belt Activity Tolerance: Patient limited by fatigue;Treatment limited secondary to medical complications (Comment) Patient left: in bed;with call bell/phone within reach;with nursing/sitter in room Nurse Communication: Mobility status PT Visit Diagnosis: Unsteadiness on feet  (R26.81);Muscle weakness (generalized) (M62.81);Difficulty in walking, not elsewhere classified (R26.2);Hemiplegia and hemiparesis Hemiplegia - Right/Left: Right Hemiplegia - dominant/non-dominant: Dominant Hemiplegia - caused by: Cerebral infarction    Time: 1115-1140 PT Time Calculation (min) (ACUTE ONLY): 25 min   Charges:   PT Evaluation $PT Eval Moderate Complexity: 1 Mod PT Treatments $Gait Training: 8-22 mins       Ramond Dial 05/15/2021, 3:11 PM Mee Hives, PT MS Acute Rehab Dept. Number: [place] and [place]

## 2021-05-15 NOTE — ED Notes (Signed)
This RN attempted to call report to [phone].

## 2021-05-15 NOTE — Progress Notes (Signed)
  Echocardiogram 2D Echocardiogram has been performed.  [name] 05/15/2021, 1:27 PM

## 2021-05-15 NOTE — Progress Notes (Signed)
PROGRESS NOTE    [name]  BSJ:[id number] DOB: October 18, 1956 DOA: 05/14/2021 PCP: Patient, No Pcp Per (Inactive)    Chief Complaint  Patient presents with   Extremity Weakness   Aphasia   Brief Narrative:  65 yo with hx etoh abuse, PTSD, T2DM, HLD, HTN, hep C, RA, tobacco abuse and multiple other medical problems who presented with right sided weakness and slurred speech 4 days prior to admission.  MRI brain showed acute/subacute 8.5 mm nonhemorrhagic infarct in the L pons, remote nonhemorrhagic lacunar infarct of the right thalamus, remote hemorrhagic infarct int he posterior right cerebellum, chronic microvascular ischemia.  Neurology was consulted.  He has pending speech and OT consults.   Assessment & Plan:   Active Problems:   Hypercholesteremia   Essential hypertension   Rheumatoid arthritis ([place])   CVA (cerebral vascular accident) ([place])   Alcohol abuse   Tobacco abuse   DM2 (diabetes mellitus, type 2) ([place])  Acute Stroke with Right Sided Weakness and Dysarthria MRI with acute/subacute 8.5 mm nonhemorrhagic infarct in the L pons, remote nonhemorrhagic lacunar infarct of the right thalamus, remote hemorrhagic infarct in the posterior right cerebellum, atrophy and white matter disease advanced for age CTA head/neck without intracranial arterial occlusion or high grade stenosis, bilateral carotid bifurcation atherosclerosis with less than 50% stenosis Echo without intracardiac source of embolism, TEE if clinically indicated Neurology notes likely 2/2 small vessel diseae, recommending ASA/plavix x21 days then plavix alone.   Continue lipitor  A1c 5.9, LDL 76 Pending OT and speech recommendations PT recommending home health, rolling walker  Hypertension amlodipine  Dyslipidemia Gemfibrozil, lipitor  T2DM Holding metformin SSI  Tobacco Abuse Encourage cessation  Etoh Abuse CIWA, encourage cessation  MJ use Cessation   DVT prophylaxis: lovenox Code Status:  full  Family Communication: none at bedside Disposition:   Status is: Inpatient  Remains inpatient appropriate because:Inpatient level of care appropriate due to severity of illness  Dispo: The patient is from: Home              Anticipated d/c is to: Home              Patient currently is not medically stable to d/c.   Difficult to place patient No  Consultants:  neurology  Procedures:  Echo IMPRESSIONS     1. Left ventricular ejection fraction, by estimation, is 60 to 65%. The  left ventricle has normal function. The left ventricle has no regional  wall motion abnormalities. There is mild left ventricular hypertrophy of  the basal-septal segment. Left  ventricular diastolic parameters are consistent with Grade I diastolic  dysfunction (impaired relaxation).   2. Right ventricular systolic function is normal. The right ventricular  size is normal.   3. Unusual echogenicity in the left atrium appears fixed and is probably  [name] prominent "coumadin ridge", rather than [name] thrombus.   4. The mitral valve is normal in structure. No evidence of mitral valve  regurgitation. No evidence of mitral stenosis.   5. The aortic valve is normal in structure. Aortic valve regurgitation is  not visualized. No aortic stenosis is present.   6. The inferior vena cava is normal in size with greater than 50%  respiratory variability, suggesting right atrial pressure of 3 mmHg.   7. Agitated saline contrast bubble study was negative, with no evidence  of any interatrial shunt.   Conclusion(s)/Recommendation(s): No intracardiac source of embolism  detected on this transthoracic study. [name] transesophageal echocardiogram is  recommended to exclude  cardiac source of embolism if clinically indicated.   Antimicrobials:  Anti-infectives (From admission, onward)    None      Subjective: No new complaints  Objective: Vitals:   05/15/21 1045 05/15/21 1145 05/15/21 1230 05/15/21 1505  BP: (!)  186/91 (!) 174/76 (!) 167/94 (!) 170/87  Pulse: 60 65 (!) 56 61  Resp: 15 (!) 21 14 18   Temp:    98.2 F (36.8 C)  TempSrc:    Oral  SpO2: 96% 97% 96%   Weight:      Height:        Intake/Output Summary (Last 24 hours) at 05/15/2021 1836 Last data filed at 05/15/2021 1619 Gross per 24 hour  Intake --  Output 400 ml  Net -400 ml   Filed Weights   05/14/21 1245  Weight: 64.5 kg    Examination:  General exam: Appears calm and comfortable  Respiratory system: Clear to auscultation. Respiratory effort normal. Cardiovascular system: S1 & S2 heard, RRR. Gastrointestinal system: Abdomen is nondistended, soft and nontender. Central nervous system: R sided weakness (upper 4/5, lower with about equal strength to left), dysarthria Extremities: no LEE Skin: No rashes, lesions or ulcers Psychiatry: Judgement and insight appear normal. Mood & affect appropriate.     Data Reviewed: I have personally reviewed following labs and imaging studies  CBC: Recent Labs  Lab 05/14/21 1321 05/15/21 0902  WBC 10.7* 10.1  NEUTROABS 5.7 5.0  HGB 16.6 16.7  HCT 48.0 47.2  MCV 95.6 94.6  PLT 220 008    Basic Metabolic Panel: Recent Labs  Lab 05/14/21 1321 05/14/21 1957 05/15/21 0902  NA 134*  --  132*  K 3.7  --  3.3*  CL 101  --  101  CO2 22  --  25  GLUCOSE 98  --  143*  BUN 8  --  6*  CREATININE 0.75  --  0.73  CALCIUM 9.4  --  9.3  MG  --  1.9  --   PHOS  --  3.4  --     GFR: Estimated Creatinine Clearance: 85.1 mL/min (by C-G formula based on SCr of 0.73 mg/dL).  Liver Function Tests: Recent Labs  Lab 05/14/21 1321 05/15/21 0902  AST 34 34  ALT 24 24  ALKPHOS 79 63  BILITOT 0.7 1.2  PROT 7.8 7.3  ALBUMIN 3.9 3.5    CBG: Recent Labs  Lab 05/15/21 0006 05/15/21 0310 05/15/21 0754 05/15/21 1142 05/15/21 1520  GLUCAP 126* 116* 133* 110* 121*     Recent Results (from the past 240 hour(s))  Resp Panel by RT-PCR (Flu [name]&B, Covid) Nasopharyngeal Swab      Status: None   Collection Time: 05/14/21  1:54 PM   Specimen: Nasopharyngeal Swab; Nasopharyngeal(NP) swabs in vial transport medium  Result Value Ref Range Status   SARS Coronavirus 2 by RT PCR NEGATIVE NEGATIVE Final    Comment: (NOTE) SARS-CoV-2 target nucleic acids are NOT DETECTED.  The SARS-CoV-2 RNA is generally detectable in upper respiratory specimens during the acute phase of infection. The lowest concentration of SARS-CoV-2 viral copies this assay can detect is 138 copies/mL. Maximilian Tallo negative result does not preclude SARS-Cov-2 infection and should not be used as the sole basis for treatment or other patient management decisions. Lachlan Pelto negative result may occur with  improper specimen collection/handling, submission of specimen other than nasopharyngeal swab, presence of viral mutation(s) within the areas targeted by this assay, and inadequate number of viral copies(<138 copies/mL). Tosca Pletz negative result  must be combined with clinical observations, patient history, and epidemiological information. The expected result is Negative.  Fact Sheet for Patients:  EntrepreneurPulse.com.au  Fact Sheet for Healthcare Providers:  IncredibleEmployment.be  This test is no t yet approved or cleared by the [place] FDA and  has been authorized for detection and/or diagnosis of SARS-CoV-2 by FDA under an Emergency Use Authorization (EUA). This EUA will remain  in effect (meaning this test can be used) for the duration of the COVID-19 declaration under Section 564(b)(1) of the Act, 21 U.S.C.section 360bbb-3(b)(1), unless the authorization is terminated  or revoked sooner.       Influenza Casi Westerfeld by PCR NEGATIVE NEGATIVE Final   Influenza B by PCR NEGATIVE NEGATIVE Final    Comment: (NOTE) The Xpert Xpress SARS-CoV-2/FLU/RSV plus assay is intended as an aid in the diagnosis of influenza from Nasopharyngeal swab specimens and should not be used as Aunica Dauphinee sole basis  for treatment. Nasal washings and aspirates are unacceptable for Xpert Xpress SARS-CoV-2/FLU/RSV testing.  Fact Sheet for Patients: EntrepreneurPulse.com.au  Fact Sheet for Healthcare Providers: IncredibleEmployment.be  This test is not yet approved or cleared by the [place] FDA and has been authorized for detection and/or diagnosis of SARS-CoV-2 by FDA under an Emergency Use Authorization (EUA). This EUA will remain in effect (meaning this test can be used) for the duration of the COVID-19 declaration under Section 564(b)(1) of the Act, 21 U.S.C. section 360bbb-3(b)(1), unless the authorization is terminated or revoked.  Performed at [facility], [place] [address], [place], [place] [address]          Radiology Studies: CT ANGIO HEAD NECK W WO CM  Result Date: 05/14/2021 CLINICAL DATA:  Slurred speech and right-sided weakness. Pontine infarct EXAM: CT ANGIOGRAPHY HEAD AND NECK TECHNIQUE: Multidetector CT imaging of the head and neck was performed using the standard protocol during bolus administration of intravenous contrast. Multiplanar CT image reconstructions and MIPs were obtained to evaluate the vascular anatomy. Carotid stenosis measurements (when applicable) are obtained utilizing NASCET criteria, using the distal internal carotid diameter as the denominator. CONTRAST:  85mL OMNIPAQUE IOHEXOL 350 MG/ML SOLN COMPARISON:  Brain MRI 05/14/2021 FINDINGS: CTA NECK FINDINGS SKELETON: There is no bony spinal canal stenosis. No lytic or blastic lesion. OTHER NECK: Normal pharynx, larynx and major salivary glands. No cervical lymphadenopathy. Unremarkable thyroid gland. UPPER CHEST: No pneumothorax or pleural effusion. No nodules or masses. AORTIC ARCH: There is no calcific atherosclerosis of the aortic arch. There is no aneurysm, dissection or hemodynamically significant stenosis of the visualized portion of the aorta. Conventional 3 vessel  aortic branching pattern. The visualized proximal subclavian arteries are widely patent. RIGHT CAROTID SYSTEM: No dissection, occlusion or aneurysm. There is mixed density atherosclerosis extending into the proximal ICA, resulting in less than 50% stenosis. LEFT CAROTID SYSTEM: No dissection, occlusion or aneurysm. There is mixed density atherosclerosis extending into the proximal ICA, resulting in less than 50% stenosis. VERTEBRAL ARTERIES: Left dominant configuration. Atherosclerosis at both vertebral artery origins. There is no dissection, occlusion or flow-limiting stenosis to the skull base (V1-V3 segments). CTA HEAD FINDINGS POSTERIOR CIRCULATION: --Vertebral arteries: Normal V4 segments. --Inferior cerebellar arteries: Normal. --Basilar artery: Normal. --Superior cerebellar arteries: Normal. --Posterior cerebral arteries (PCA): Normal. ANTERIOR CIRCULATION: --Intracranial internal carotid arteries: Normal. --Anterior cerebral arteries (ACA): Normal. Both A1 segments are present. Patent anterior communicating artery (Jaimeson Gopal-comm). --Middle cerebral arteries (MCA): Normal. VENOUS SINUSES: As permitted by contrast timing, patent. ANATOMIC VARIANTS: Both posterior communicating arteries are patent. Review  of the MIP images confirms the above findings. IMPRESSION: 1. No intracranial arterial occlusion or high-grade stenosis. 2. Bilateral carotid bifurcation atherosclerosis with less than 50% stenosis. Electronically Signed   By: Ulyses Jarred M.D.   On: 05/14/2021 20:10   DG Shoulder Right  Result Date: 05/14/2021 CLINICAL DATA:  Right shoulder pain. EXAM: RIGHT SHOULDER - 2+ VIEW COMPARISON:  Right shoulder radiographs 01/07/2017 FINDINGS: Progressive advanced degenerative changes are present in the right shoulder. Acromial spurring noted. Visualized hemithorax clear. IMPRESSION: Progressive advanced degenerative changes of the right shoulder. Electronically Signed   By: San Morelle M.D.   On: 05/14/2021  13:36   CT HEAD WO CONTRAST  Result Date: 05/14/2021 CLINICAL DATA:  Neuro deficit, acute, stroke suspected. Additional provided: Aphasia, extremity weakness. EXAM: CT HEAD WITHOUT CONTRAST TECHNIQUE: Contiguous axial images were obtained from the base of the skull through the vertex without intravenous contrast. COMPARISON:  CT head/cervical spine 01/07/2017. FINDINGS: Brain: Cerebral volume is normal. Mild patchy and ill-defined hypoattenuation within the cerebral white matter, nonspecific but compatible with chronic small vessel ischemic disease. There is no acute intracranial hemorrhage. No demarcated cortical infarct. No extra-axial fluid collection. No evidence of an intracranial mass. No midline shift. Vascular: No hyperdense vessel.  Atherosclerotic calcifications. Skull: Normal. Negative for fracture or focal lesion. Sinuses/Orbits: Visualized orbits show no acute finding. Chronic inferiorly displaced fracture deformity of the right orbital floor. Small volume frothy secretions within Kameron Glazebrook posterior right ethmoid air cell. IMPRESSION: No evidence of acute intra abnormality. Mild chronic small vessel ischemic changes within the cerebral white matter. Redemonstrated chronic inferiorly displaced fracture deformity of the right orbital floor. Mild right ethmoid sinusitis. Electronically Signed   By: Kellie Simmering DO   On: 05/14/2021 14:59   MR BRAIN WO CONTRAST  Result Date: 05/14/2021 CLINICAL DATA:  Neuro deficit, acute, stroke suspected. Right upper extremity weakness beginning 4 days ago. EXAM: MRI HEAD WITHOUT CONTRAST TECHNIQUE: Multiplanar, multiecho pulse sequences of the brain and surrounding structures were obtained without intravenous contrast. COMPARISON:  CT head without contrast 05/14/2021. FINDINGS: Brain: Diffusion-weighted images demonstrate no acute nonhemorrhagic infarct in the left pons measuring up to 8.5 mm. T2 and FLAIR hyperintensities associated with the infarct. Acute hemorrhage is  present. Periventricular and subcortical white matter changes are moderately advanced for age. Remote lacunar infarct is present in the right thalamus. Susceptibility artifact extending into the posterior right lentiform nucleus likely represents developmental venous anomaly. Susceptibility posteromedial right cerebellum compatible with remote hemorrhagic infarct. Brainstem and cerebellum are otherwise within normal limits. The internal auditory canals are within normal limits. The ventricles are of normal size. No significant extraaxial fluid collection is present. Vascular: Flow is present in the major intracranial arteries. Skull and upper cervical spine: Degenerative changes noted at C3-4 with sclerotic endplate change. Marrow signal otherwise normal. Central canal stenosis is present at C3-4. Craniocervical junction normal. Midline structures otherwise within limits. Sinuses/Orbits: The paranasal sinuses and mastoid air cells are clear. The globes and orbits are within normal limits. IMPRESSION: 1. Acute/subacute 8.5 mm nonhemorrhagic infarct in the left pons. 2. Remote nonhemorrhagic lacunar infarct of the right thalamus. 3. Remote hemorrhagic infarct in the posterior right cerebellum. 4. Atrophy and white matter disease is moderately advanced for age. This likely reflects the sequela of chronic microvascular ischemia. 5. Degenerative changes in the cervical spine with central canal stenosis at C3-4. These results were called by telephone at the time of interpretation on 05/14/2021 at 5:24 pm to provider Deno Etienne, who verbally  acknowledged these results. Electronically Signed   By: San Morelle M.D.   On: 05/14/2021 17:29   DG Chest Port 1 View  Result Date: 05/14/2021 CLINICAL DATA:  Stroke EXAM: PORTABLE CHEST 1 VIEW COMPARISON:  09/11/2020 FINDINGS: The heart size and mediastinal contours are within normal limits. Both lungs are clear. The visualized skeletal structures are unremarkable.  IMPRESSION: No active disease. Electronically Signed   By: Donavan Foil M.D.   On: 05/14/2021 22:07   ECHOCARDIOGRAM COMPLETE BUBBLE STUDY  Result Date: 05/15/2021    ECHOCARDIOGRAM REPORT   Patient Name:   [name] Date of Exam: 05/15/2021 Medical Rec #:  [id number]       Height:       67.0 in Accession #:    [id number]      Weight:       142.2 lb Date of Birth:  1956/03/18       BSA:          1.749 m Patient Age:    65 years        BP:           167/94 mmHg Patient Gender: M               HR:           60 bpm. Exam Location:  Inpatient Procedure: 2D Echo, Cardiac Doppler, Color Doppler and Saline Contrast Bubble            Study Indications:    Stroke I63.9  History:        Patient has no prior history of Echocardiogram examinations.                 Risk Factors:Hypertension, Diabetes, Dyslipidemia and Current                 Smoker. Alcohol abuse. GERD.  Sonographer:    Jonelle Sidle Dance Referring Phys: [id number] Mucarabones  1. Left ventricular ejection fraction, by estimation, is 60 to 65%. The left ventricle has normal function. The left ventricle has no regional wall motion abnormalities. There is mild left ventricular hypertrophy of the basal-septal segment. Left ventricular diastolic parameters are consistent with Grade I diastolic dysfunction (impaired relaxation).  2. Right ventricular systolic function is normal. The right ventricular size is normal.  3. Unusual echogenicity in the left atrium appears fixed and is probably Ayen Viviano prominent "coumadin ridge", rather than Emerlyn Mehlhoff thrombus.  4. The mitral valve is normal in structure. No evidence of mitral valve regurgitation. No evidence of mitral stenosis.  5. The aortic valve is normal in structure. Aortic valve regurgitation is not visualized. No aortic stenosis is present.  6. The inferior vena cava is normal in size with greater than 50% respiratory variability, suggesting right atrial pressure of 3 mmHg.  7. Agitated saline contrast bubble  study was negative, with no evidence of any interatrial shunt. Conclusion(s)/Recommendation(s): No intracardiac source of embolism detected on this transthoracic study. Afnan Emberton transesophageal echocardiogram is recommended to exclude cardiac source of embolism if clinically indicated. FINDINGS  Left Ventricle: Left ventricular ejection fraction, by estimation, is 60 to 65%. The left ventricle has normal function. The left ventricle has no regional wall motion abnormalities. The left ventricular internal cavity size was normal in size. There is  mild left ventricular hypertrophy of the basal-septal segment. Left ventricular diastolic parameters are consistent with Grade I diastolic dysfunction (impaired relaxation). Normal left ventricular filling pressure. Right Ventricle: The right ventricular size is normal. No  increase in right ventricular wall thickness. Right ventricular systolic function is normal. Left Atrium: Unusual echogenicity in the left atrium appears fixed and is probably Nicolet Griffy prominent "coumadin ridge", rather than Tuvia Woodrick thrombus. Left atrial size was normal in size. Right Atrium: Right atrial size was normal in size. Pericardium: There is no evidence of pericardial effusion. Mitral Valve: The mitral valve is normal in structure. No evidence of mitral valve regurgitation. No evidence of mitral valve stenosis. Tricuspid Valve: The tricuspid valve is normal in structure. Tricuspid valve regurgitation is not demonstrated. No evidence of tricuspid stenosis. Aortic Valve: The aortic valve is normal in structure. Aortic valve regurgitation is not visualized. No aortic stenosis is present. Pulmonic Valve: The pulmonic valve was normal in structure. Pulmonic valve regurgitation is not visualized. No evidence of pulmonic stenosis. Aorta: The aortic root is normal in size and structure. Venous: The inferior vena cava is normal in size with greater than 50% respiratory variability, suggesting right atrial pressure of 3 mmHg.  IAS/Shunts: No atrial level shunt detected by color flow Doppler. Agitated saline contrast was given intravenously to evaluate for intracardiac shunting. Agitated saline contrast bubble study was negative, with no evidence of any interatrial shunt.  LEFT VENTRICLE PLAX 2D LVIDd:         4.40 cm  Diastology LVIDs:         2.90 cm  LV e' medial:    5.87 cm/s LV PW:         1.40 cm  LV E/e' medial:  11.3 LV IVS:        1.10 cm  LV e' lateral:   8.70 cm/s LVOT diam:     2.10 cm  LV E/e' lateral: 7.6 LV SV:         81 LV SV Index:   46 LVOT Area:     3.46 cm  RIGHT VENTRICLE             IVC RV Basal diam:  2.40 cm     IVC diam: 1.10 cm RV S prime:     12.10 cm/s TAPSE (M-mode): 2.6 cm LEFT ATRIUM             Index       RIGHT ATRIUM           Index LA diam:        4.20 cm 2.40 cm/m  RA Area:     14.90 cm LA Vol (A2C):   58.5 ml 33.44 ml/m RA Volume:   33.80 ml  19.32 ml/m LA Vol (A4C):   44.9 ml 25.67 ml/m LA Biplane Vol: 51.0 ml 29.15 ml/m  AORTIC VALVE LVOT Vmax:   129.00 cm/s LVOT Vmean:  89.100 cm/s LVOT VTI:    0.234 m  AORTA Ao Root diam: 3.10 cm Ao Asc diam:  2.90 cm MITRAL VALVE MV Area (PHT): 2.83 cm    SHUNTS MV Decel Time: 268 msec    Systemic VTI:  0.23 m MV E velocity: 66.20 cm/s  Systemic Diam: 2.10 cm MV Kahleel Fadeley velocity: 95.20 cm/s MV E/Rhilee Currin ratio:  0.70 Mihai Croitoru MD Electronically signed by Sanda Klein MD Signature Date/Time: 05/15/2021/4:38:34 PM    Final         Scheduled Meds:  amLODipine  5 mg Oral Daily   atorvastatin  40 mg Oral Daily   clopidogrel  75 mg Oral Daily   folic acid  1 mg Oral Daily   gemfibrozil  600 mg Oral BID AC  insulin aspart  0-9 Units Subcutaneous Q4H   multivitamin with minerals  1 tablet Oral Daily   nicotine  14 mg Transdermal Daily   thiamine  100 mg Oral Daily   Or   thiamine  100 mg Intravenous Daily   Continuous Infusions:  sodium chloride Stopped (05/15/21 0707)     LOS: 1 day    Time spent: over [place], MD [facility]   To contact the attending provider between 7A-7P or the covering provider during after hours 7P-7A, please log into the web site [email or web address] and access using universal [place] password for that web site. If you do not have the password, please call the hospital operator.  05/15/2021, 6:36 PM

## 2021-05-15 NOTE — ED Notes (Signed)
Pt transporting upstairs at this time.

## 2021-05-16 DIAGNOSIS — E118 Type 2 diabetes mellitus with unspecified complications: Secondary | ICD-10-CM

## 2021-05-16 DIAGNOSIS — F10920 Alcohol use, unspecified with intoxication, uncomplicated: Secondary | ICD-10-CM

## 2021-05-16 DIAGNOSIS — F1290 Cannabis use, unspecified, uncomplicated: Secondary | ICD-10-CM

## 2021-05-16 LAB — GLUCOSE, CAPILLARY
Glucose-Capillary: 121 mg/dL — ABNORMAL HIGH (ref 70–99)
Glucose-Capillary: 125 mg/dL — ABNORMAL HIGH (ref 70–99)
Glucose-Capillary: 112 mg/dL — ABNORMAL HIGH (ref 70–99)

## 2021-05-16 MED ORDER — ASPIRIN EC 81 MG PO TBEC: 81.0000 mg | DELAYED_RELEASE_TABLET | Freq: Every day | ORAL | 0 refills | Status: AC

## 2021-05-16 MED ORDER — AMLODIPINE BESYLATE 5 MG PO TABS: 5.0000 mg | ORAL_TABLET | Freq: Every day | ORAL | 2 refills | Status: DC

## 2021-05-16 MED ORDER — THIAMINE HCL 100 MG PO TABS: 100.0000 mg | ORAL_TABLET | Freq: Every day | ORAL | Status: DC

## 2021-05-16 MED ORDER — FOLIC ACID 1 MG PO TABS: 1.0000 mg | ORAL_TABLET | Freq: Every day | ORAL | Status: DC

## 2021-05-16 MED ORDER — ATORVASTATIN CALCIUM 40 MG PO TABS: 40.0000 mg | ORAL_TABLET | Freq: Every day | ORAL | 2 refills | Status: DC

## 2021-05-16 MED ORDER — CLOPIDOGREL BISULFATE 75 MG PO TABS: 75.0000 mg | ORAL_TABLET | Freq: Every day | ORAL | 2 refills | Status: DC

## 2021-05-16 MED ORDER — METFORMIN HCL 500 MG PO TABS: 500.0000 mg | ORAL_TABLET | Freq: Every day | ORAL | 2 refills | Status: DC

## 2021-05-16 MED ORDER — HYDROCHLOROTHIAZIDE 12.5 MG PO CAPS: 12.5000 mg | ORAL_CAPSULE | Freq: Every day | ORAL | 2 refills | Status: DC

## 2021-05-16 MED ORDER — ADULT MULTIVITAMIN W/MINERALS CH: 1.0000 | ORAL_TABLET | Freq: Every day | ORAL | Status: DC

## 2021-05-16 NOTE — TOC CAGE-AID Note (Signed)
Transition of Care [facility]) - CAGE-AID Screening   Patient Details  Name: [name] MRN: [id number] Date of Birth: 16-Apr-1956  [facility] CM/SW Contact:    Sandor Arboleda C Tarpley-Carter, [place] Phone Number: 05/16/2021, 1:41 PM   Clinical Narrative: Pt participated in [place].  Pt stated he only drinks ETOH on special occasions, but not daily. Pt was offered resources.  Pt stated they did not feel that they were in need of resources at this time.   Kaylene Dawn Tarpley-Carter, MSW, LCSW-A Pronouns:  She/Her/Hers [facility] Clinical Social Worker Direct Number:  [phone] [email or web address]   CAGE-AID Screening:    Have You Ever Felt You Ought to Cut Down on Your Drinking or Drug Use?: Yes Have People Annoyed You By [employer] Your Drinking Or Drug Use?: No Have You Felt Bad Or Guilty About Your Drinking Or Drug Use?: No Have You Ever Had a Drink or Used Drugs First Thing In The Morning to Steady Your Nerves or to Get Rid of a Hangover?: No CAGE-AID Score: 1  Substance Abuse Education Offered: Yes  Substance abuse interventions: Scientist, clinical (histocompatibility and immunogenetics)

## 2021-05-16 NOTE — Progress Notes (Signed)
Physical Therapy Treatment Patient Details Name: [name] MRN: [id number] DOB: [birth year] Today's Date: 05/16/2021    History of Present Illness 65 yo male with onset of R side weakness was referred to PT for mobility, and noted his tendency to list back and to R side with balance challenges.  Pt was diagnosed with L pons infarct, with additional R shoulder DJD, but baseline was independent gait with no AD.  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  He is independent with bed mobility and transfers. Ambulated around unit > 500 feet without ad, no lob. Patient appears to be at baseline level of function. No follow up at this time.     Follow Up Recommendations  No PT follow up     Equipment Recommendations  None recommended by PT    Recommendations for Other Services       Precautions / Restrictions Precautions Precaution Comments: Low fall Restrictions Weight Bearing Restrictions: No    Mobility  Bed Mobility Overal bed mobility: Independent Bed Mobility: Supine to Sit     Supine to sit: Independent          Transfers Overall transfer level: Independent                  Ambulation/Gait Ambulation/Gait assistance: Supervision Gait Distance (Feet): 500 Feet Assistive device: None Gait Pattern/deviations: Step-through pattern Gait velocity: normal   General Gait Details: patient ambulated in hallways without lob, normal pace, quick turns at ends of halls.   Stairs             Wheelchair Mobility    Modified Rankin (Stroke Patients Only)       Balance Overall balance assessment: Mild deficits observed, not formally tested                                          Cognition Arousal/Alertness: Awake/alert Behavior During Therapy:  Impulsive Overall Cognitive Status: Within Functional Limits for tasks assessed                                        Exercises      General Comments        Pertinent Vitals/Pain Pain Assessment: No/denies pain    Home Living                      Prior Function            PT Goals (current goals can now be found in the care plan section) Acute Rehab PT Goals Patient Stated Goal: to walk alone and get home PT Goal Formulation: With patient Time For Goal Achievement: 05/29/21 Potential to Achieve Goals: Good Progress towards PT goals: Progressing toward goals    Frequency    Min 4X/week      PT Plan Discharge plan needs to be updated    Co-evaluation              AM-PAC PT "6 Clicks" Mobility   Outcome Measure  Help needed turning from your back to your side while in a  flat bed without using bedrails?: None Help needed moving from lying on your back to sitting on the side of a flat bed without using bedrails?: None Help needed moving to and from a bed to a chair (including a wheelchair)?: None Help needed standing up from a chair using your arms (e.g., wheelchair or bedside chair)?: None Help needed to walk in hospital room?: None Help needed climbing 3-5 steps with a railing? : None 6 Click Score: 24    End of Session Equipment Utilized During Treatment: Gait belt Activity Tolerance: Patient tolerated treatment well Patient left: Other (comment) (sitting up on side of bed) Nurse Communication: Mobility status PT Visit Diagnosis: Difficulty in walking, not elsewhere classified (R26.2) Hemiplegia - Right/Left: Right Hemiplegia - dominant/non-dominant: Dominant Hemiplegia - caused by: Cerebral infarction     Time: 6389-3734 PT Time Calculation (min) (ACUTE ONLY): 10 min  Charges:  $Gait Training: 8-22 mins                    Leighanne Adolph, PT, GCS 05/16/21,9:52 AM

## 2021-05-16 NOTE — TOC CAGE-AID Note (Signed)
Transition of Care [facility]) - CAGE-AID Screening   Patient Details  Name: [name] MRN: [id number] Date of Birth: [birth year]  [facility]) CM/SW Contact:    Pollie Friar, RN Phone Number: 05/16/2021, 10:40 AM   Clinical Narrative: Pt states he uses THC for pain control. He denied the need for counseling resources.  CAGE-AID Screening:    Have You Ever Felt You Ought to Cut Down on Your Drinking or Drug Use?: Yes Have People Annoyed You By Critizing Your Drinking Or Drug Use?: No Have You Felt Bad Or Guilty About Your Drinking Or Drug Use?: No Have You Ever Had a Drink or Used Drugs First Thing In The Morning to Steady Your Nerves or to Get Rid of a Hangover?: No CAGE-AID Score: 1  Substance Abuse Education Offered: Yes (pt refused)

## 2021-05-16 NOTE — Hospital Course (Signed)
65 yo with hx etoh abuse, PTSD, T2DM, HLD, HTN, hep C, RA, tobacco abuse and multiple other medical problems who presented with right sided weakness and slurred speech 4 days prior to admission.  MRI brain showed acute/subacute 8.5 mm nonhemorrhagic infarct in the L pons, remote nonhemorrhagic lacunar infarct of the right thalamus, remote hemorrhagic infarct int he posterior right cerebellum, chronic microvascular ischemia.  Neurology was consulted.  He has pending speech and OT consults.  Acute Stroke with Right Sided Weakness and Dysarthria, secondary to small vessel disease. --CT head negative, CT head and neck no large vessel occlusion.  MRI brain 8.5 mm left pons infarct.  Echocardiogram normal LVEF grade 1 diastolic dysfunction, normal RV systolic function --Per neurology aspirin and Plavix for 21 days followed by Plavix alone. --Started on atorvastatin. --Aim for normotensive state --HH PT, rolling walker initially recommended but on follow-up 7/20, patient did well, no equipment or follow-up recommended by PT or OT --f/u with neurology outpatient   Essential hypertension -- amlodipine   Dyslipidemia Gemfibrozil, lipitor   T2DM CBG stable, holding metformin, resume on discharge, continue SSI   Tobacco Abuse Encourage cessation   Etoh Abuse with intoxication on admission CIWA, encourage cessation  MJ use Cessation   Medication noncompliance  Smoker  --Stop smoking

## 2021-05-16 NOTE — Evaluation (Addendum)
Occupational Therapy Evaluation Patient Details Name: [name] MRN: [id number] DOB: 1956-01-19 Today's Date: 05/16/2021    History of Present Illness 65 yo male with onset of R side weakness was referred to PT for mobility, and noted his tendency to list back and to R side with balance challenges.  Pt was diagnosed with L pons infarct, with additional R shoulder DJD, but baseline was independent gait with no AD.  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 He demonstrates very mild balance and cognitive deficits which don't appear to effect him functionally.  He feels he is at, or close to baseline.  He lives alone and was fully independent PTA.  No further OT recommended.     Follow Up Recommendations  No OT follow up    Equipment Recommendations  None recommended by OT    Recommendations for Other Services       Precautions / Restrictions Precautions Precautions: None Precaution Comments: Low fall Restrictions Weight Bearing Restrictions: No      Mobility Bed Mobility Overal bed mobility: Independent Bed Mobility: Supine to Sit     Supine to sit: Independent          Transfers Overall transfer level: Independent                    Balance Overall balance assessment: Mild deficits observed, not formally tested                                         ADL either performed or assessed with clinical judgement   ADL Overall ADL's : Independent                                       General ADL Comments: pt able to perform ADLs independently including retrieving items from closet and drawers     Vision Baseline Vision/History: Wears glasses Wears Glasses: Reading only;Distance only Patient Visual Report: No change from baseline Vision Assessment?: Yes Eye  Alignment: Within Functional Limits Alignment/Gaze Preference: Within Defined Limits Tracking/Visual Pursuits: Able to track stimulus in all quads without difficulty Saccades: Within functional limits Convergence: Within functional limits Visual Fields: No apparent deficits     Agricultural engineer Tested?: Yes   Praxis Praxis Praxis tested?: Within functional limits    Pertinent Vitals/Pain Pain Assessment: No/denies pain     Hand Dominance Right   Extremity/Trunk Assessment Upper Extremity Assessment Upper Extremity Assessment: RUE deficits/detail RUE Deficits / Details: Pt reports h/o Rt rotator cuff injury/surgery which limits shoulder elevation.  He reports he is at baseline.  elbow distally [facility]   Lower Extremity Assessment Lower Extremity Assessment: Defer to PT evaluation       Communication Communication Communication: No difficulties   Cognition Arousal/Alertness: Awake/alert Behavior During Therapy: WFL for tasks assessed/performed Overall Cognitive Status: History of cognitive impairments - at baseline                                 General Comments: Pt demonstrates difficulty with attention and sequencing, as well as mild memory deficits.  he  reports he feels this is normal for him, and denies impact functionally.  He reports he was home for several days prior to coming to hospital and no deficits other than balance.   General Comments  reviewed BEFAST with pt    Exercises     Shoulder Instructions      Home Living Family/patient expects to be discharged to:: Private residence Living Arrangements: Alone Available Help at Discharge: Family;Friend(s);Available PRN/intermittently Type of Home: House Home Access: Level entry     Home Layout: One level     Bathroom Shower/Tub: Teacher, early years/pre: Handicapped height     Home Equipment: Cane - single point          Prior Functioning/Environment Level of  Independence: Independent;Independent with assistive device(s)        Comments: Pt reports he does not work.  He is a retired Holiday representative.  He does drive.  He is independent with ADLs        OT Problem List: Impaired balance (sitting and/or standing)      OT Treatment/Interventions:      OT Goals(Current goals can be found in the care plan section) Acute Rehab OT Goals Patient Stated Goal: to go home today OT Goal Formulation: All assessment and education complete, DC therapy  OT Frequency:     Barriers to D/C:            Co-evaluation              AM-PAC OT "6 Clicks" Daily Activity     Outcome Measure Help from another person eating meals?: None Help from another person taking care of personal grooming?: None Help from another person toileting, which includes using toliet, bedpan, or urinal?: None Help from another person bathing (including washing, rinsing, drying)?: None Help from another person to put on and taking off regular upper body clothing?: None Help from another person to put on and taking off regular lower body clothing?: None 6 Click Score: 24   End of Session Nurse Communication: Mobility status  Activity Tolerance: Patient tolerated treatment well Patient left: in bed;with call bell/phone within reach  OT Visit Diagnosis: Unsteadiness on feet (R26.81)                Time: 1000-1018 OT Time Calculation (min): 18 min Charges:  OT General Charges $OT Visit: 1 Visit OT Evaluation $OT Eval Low Complexity: 1 Low  Nilsa Nutting., OTR/L Acute Rehabilitation Services Pager [phone] Office [phone]   Lucille Passy M 05/16/2021, 10:30 AM

## 2021-05-16 NOTE — Discharge Summary (Signed)
Physician Discharge Summary  [name] HEN:[id number] DOB: 04-21-1956 DOA: 05/14/2021  PCP: [name], NP  Admit date: 05/14/2021 Discharge date: 05/16/2021  Recommendations for Outpatient Follow-up:  Follow-up stroke.   Follow-up Information     [facility] Follow up.   Why: The office will call you to schedule an appointment Contact information: [address] [place] [phone]        [name], NP. Schedule an appointment as soon as possible for a visit in 2 week(s).   Specialty: Adult Health Nurse Practitioner Contact information: [address] Renee Harder [place] [address] [phone]         [facility] Follow up.   Specialty: Neurology Why: Office will call you with an appointment Contact information: [place] [phone]               Discharge Diagnoses: Principal diagnosis is #1 Principal Problem:   CVA (cerebral vascular accident) [facility]) Active Problems:   Hypercholesteremia   Essential hypertension   Alcohol abuse   Tobacco abuse   DM2 (diabetes mellitus, type 2) ([place])   Alcoholic intoxication without complication ([place])   Marijuana use   Discharge Condition: improved Disposition: home  Diet recommendation:  Diet Orders (From admission, onward)     Start     Ordered   05/16/21 0000  Diet - low sodium heart healthy        05/16/21 1158   05/16/21 0000  Diet Carb Modified        05/16/21 1158             Filed Weights   05/14/21 1245  Weight: 64.5 kg    HPI/Hospital Course:   65 year old man PMH alcohol abuse, hypertension, diabetes mellitus type 2 presented with right-sided weakness and slurred speech.  MRI brain revealed nonhemorrhagic infarct.  Patient seen by neurology with recommendations as below.  Condition rapidly improved.  Hospitalization uncomplicated.  Acute Stroke with Right  Sided Weakness and Dysarthria, secondary to small vessel disease. --CT head negative, CT head and neck no large vessel occlusion.  MRI brain 8.5 mm left pons infarct.  Echocardiogram normal LVEF grade 1 diastolic dysfunction, normal RV systolic function --Per neurology aspirin and Plavix for 21 days followed by Plavix alone. --Started on atorvastatin. --Aim for normotensive state --HH PT, rolling walker initially recommended but on follow-up 7/20, patient did well, no equipment or follow-up recommended by PT or OT --f/u with neurology outpatient   Essential hypertension -- Continue amlodipine and hydrochlorothiazide, given new prescriptions   DM type 2 CBG stable, resume metformin on discharge.   Tobacco Abuse Encourage cessation   Etoh Abuse with intoxication on admission CIWA, no evidence of withdrawal, encourage cessation  Marijuana use Recommend cessation   Smoker  --Stop smoking  Consults:  Neurology   Today's assessment: S: CC: f/u stroke  Feels fine, no focal deficits except some "slowness" in right fingers. Eating fine, no difficulty speaking/swallowing.  O: Vitals:  Vitals:   05/16/21 0747 05/16/21 1223  BP: (!) 145/87 (!) 163/87  Pulse: 64 79  Resp: 20 18  Temp: 98.5 F (36.9 C) 98.7 F (37.1 C)  SpO2: 100% 100%    Constitutional:  Appears calm and comfortable ENMT:  grossly normal hearing  Respiratory:  CTA bilaterally, no w/r/r.  Respiratory effort normal.  Cardiovascular:  RRR, no m/r/g No LE extremity edema   Musculoskeletal:  RUE,  LUE, RLE, LLE   strength and tone grossly normal, no atrophy, no abnormal movements Gait: ambulates without assistance Psychiatric:  Mental status Mood, affect appropriate  CBG stable  Discharge Instructions  Discharge Instructions     Ambulatory referral to Neurology   Complete by: As directed    An appointment is requested in approximately: 4 weeks   Diet - low sodium heart healthy   Complete by: As  directed    Diet Carb Modified   Complete by: As directed    Discharge instructions   Complete by: As directed    Call your physician or seek immediate medical attention for weakness, numbness, falls, difficulty speaking or swallowing or worsening of condition. Please stop smoking and recommend stopping alcohol use under supervision of your physician.   Increase activity slowly   Complete by: As directed       Allergies as of 05/16/2021       Reactions   Lisinopril Other (See Comments)   angioedema   Citalopram Nausea Only   Morphine Nausea And Vomiting   Nsaids Other (See Comments)   Stomach Irritability    Paroxetine    Other reaction(s): NAUSEA,VOMITING        Medication List     STOP taking these medications    gemfibrozil 600 MG tablet Commonly known as: LOPID   omega-3 acid ethyl esters 1 g capsule Commonly known as: LOVAZA       TAKE these medications    amLODipine 5 MG tablet Commonly known as: NORVASC Take 1 tablet (5 mg total) by mouth daily.   aspirin EC 81 MG tablet Take 1 tablet (81 mg total) by mouth daily for 21 days. Swallow whole. Stop after 21 days   atorvastatin 40 MG tablet Commonly known as: LIPITOR Take 1 tablet (40 mg total) by mouth daily. Start taking on: May 17, 2021   clopidogrel 75 MG tablet Commonly known as: PLAVIX Take 1 tablet (75 mg total) by mouth daily. Start taking on: May 18, 931   folic acid 1 MG tablet Commonly known as: FOLVITE Take 1 tablet (1 mg total) by mouth daily. Start taking on: May 17, 2021   hydrochlorothiazide 12.5 MG capsule Commonly known as: MICROZIDE Take 1 capsule (12.5 mg total) by mouth daily.   metFORMIN 500 MG tablet Commonly known as: GLUCOPHAGE Take 1 tablet (500 mg total) by mouth daily with breakfast.   multivitamin with minerals Tabs tablet Take 1 tablet by mouth daily. Start taking on: May 17, 2021   thiamine 100 MG tablet Take 1 tablet (100 mg total) by mouth  daily. Start taking on: May 17, 2021       Allergies  Allergen Reactions   Lisinopril Other (See Comments)    angioedema   Citalopram Nausea Only   Morphine Nausea And Vomiting   Nsaids Other (See Comments)    Stomach Irritability    Paroxetine     Other reaction(s): NAUSEA,VOMITING    The results of significant diagnostics from this hospitalization (including imaging, microbiology, ancillary and laboratory) are listed below for reference.    Significant Diagnostic Studies: CT ANGIO HEAD NECK W WO CM  Result Date: 05/14/2021 CLINICAL DATA:  Slurred speech and right-sided weakness. Pontine infarct EXAM: CT ANGIOGRAPHY HEAD AND NECK TECHNIQUE: Multidetector CT imaging of the head and neck was performed using the standard protocol during bolus administration of intravenous contrast. Multiplanar CT image reconstructions and MIPs were obtained to evaluate the vascular anatomy. Carotid stenosis measurements (when  applicable) are obtained utilizing NASCET criteria, using the distal internal carotid diameter as the denominator. CONTRAST:  63mL OMNIPAQUE IOHEXOL 350 MG/ML SOLN COMPARISON:  Brain MRI 05/14/2021 FINDINGS: CTA NECK FINDINGS SKELETON: There is no bony spinal canal stenosis. No lytic or blastic lesion. OTHER NECK: Normal pharynx, larynx and major salivary glands. No cervical lymphadenopathy. Unremarkable thyroid gland. UPPER CHEST: No pneumothorax or pleural effusion. No nodules or masses. AORTIC ARCH: There is no calcific atherosclerosis of the aortic arch. There is no aneurysm, dissection or hemodynamically significant stenosis of the visualized portion of the aorta. Conventional 3 vessel aortic branching pattern. The visualized proximal subclavian arteries are widely patent. RIGHT CAROTID SYSTEM: No dissection, occlusion or aneurysm. There is mixed density atherosclerosis extending into the proximal ICA, resulting in less than 50% stenosis. LEFT CAROTID SYSTEM: No dissection, occlusion  or aneurysm. There is mixed density atherosclerosis extending into the proximal ICA, resulting in less than 50% stenosis. VERTEBRAL ARTERIES: Left dominant configuration. Atherosclerosis at both vertebral artery origins. There is no dissection, occlusion or flow-limiting stenosis to the skull base (V1-V3 segments). CTA HEAD FINDINGS POSTERIOR CIRCULATION: --Vertebral arteries: Normal V4 segments. --Inferior cerebellar arteries: Normal. --Basilar artery: Normal. --Superior cerebellar arteries: Normal. --Posterior cerebral arteries (PCA): Normal. ANTERIOR CIRCULATION: --Intracranial internal carotid arteries: Normal. --Anterior cerebral arteries (ACA): Normal. Both A1 segments are present. Patent anterior communicating artery (a-comm). --Middle cerebral arteries (MCA): Normal. VENOUS SINUSES: As permitted by contrast timing, patent. ANATOMIC VARIANTS: Both posterior communicating arteries are patent. Review of the MIP images confirms the above findings. IMPRESSION: 1. No intracranial arterial occlusion or high-grade stenosis. 2. Bilateral carotid bifurcation atherosclerosis with less than 50% stenosis. Electronically Signed   By: Ulyses Jarred M.D.   On: 05/14/2021 20:10   DG Shoulder Right  Result Date: 05/14/2021 CLINICAL DATA:  Right shoulder pain. EXAM: RIGHT SHOULDER - 2+ VIEW COMPARISON:  Right shoulder radiographs 01/07/2017 FINDINGS: Progressive advanced degenerative changes are present in the right shoulder. Acromial spurring noted. Visualized hemithorax clear. IMPRESSION: Progressive advanced degenerative changes of the right shoulder. Electronically Signed   By: San Morelle M.D.   On: 05/14/2021 13:36   CT HEAD WO CONTRAST  Result Date: 05/14/2021 CLINICAL DATA:  Neuro deficit, acute, stroke suspected. Additional provided: Aphasia, extremity weakness. EXAM: CT HEAD WITHOUT CONTRAST TECHNIQUE: Contiguous axial images were obtained from the base of the skull through the vertex without  intravenous contrast. COMPARISON:  CT head/cervical spine 01/07/2017. FINDINGS: Brain: Cerebral volume is normal. Mild patchy and ill-defined hypoattenuation within the cerebral white matter, nonspecific but compatible with chronic small vessel ischemic disease. There is no acute intracranial hemorrhage. No demarcated cortical infarct. No extra-axial fluid collection. No evidence of an intracranial mass. No midline shift. Vascular: No hyperdense vessel.  Atherosclerotic calcifications. Skull: Normal. Negative for fracture or focal lesion. Sinuses/Orbits: Visualized orbits show no acute finding. Chronic inferiorly displaced fracture deformity of the right orbital floor. Small volume frothy secretions within a posterior right ethmoid air cell. IMPRESSION: No evidence of acute intra abnormality. Mild chronic small vessel ischemic changes within the cerebral white matter. Redemonstrated chronic inferiorly displaced fracture deformity of the right orbital floor. Mild right ethmoid sinusitis. Electronically Signed   By: Kellie Simmering DO   On: 05/14/2021 14:59   MR BRAIN WO CONTRAST  Result Date: 05/14/2021 CLINICAL DATA:  Neuro deficit, acute, stroke suspected. Right upper extremity weakness beginning 4 days ago. EXAM: MRI HEAD WITHOUT CONTRAST TECHNIQUE: Multiplanar, multiecho pulse sequences of the brain and surrounding structures were obtained without  intravenous contrast. COMPARISON:  CT head without contrast 05/14/2021. FINDINGS: Brain: Diffusion-weighted images demonstrate no acute nonhemorrhagic infarct in the left pons measuring up to 8.5 mm. T2 and FLAIR hyperintensities associated with the infarct. Acute hemorrhage is present. Periventricular and subcortical white matter changes are moderately advanced for age. Remote lacunar infarct is present in the right thalamus. Susceptibility artifact extending into the posterior right lentiform nucleus likely represents developmental venous anomaly. Susceptibility  posteromedial right cerebellum compatible with remote hemorrhagic infarct. Brainstem and cerebellum are otherwise within normal limits. The internal auditory canals are within normal limits. The ventricles are of normal size. No significant extraaxial fluid collection is present. Vascular: Flow is present in the major intracranial arteries. Skull and upper cervical spine: Degenerative changes noted at C3-4 with sclerotic endplate change. Marrow signal otherwise normal. Central canal stenosis is present at C3-4. Craniocervical junction normal. Midline structures otherwise within limits. Sinuses/Orbits: The paranasal sinuses and mastoid air cells are clear. The globes and orbits are within normal limits. IMPRESSION: 1. Acute/subacute 8.5 mm nonhemorrhagic infarct in the left pons. 2. Remote nonhemorrhagic lacunar infarct of the right thalamus. 3. Remote hemorrhagic infarct in the posterior right cerebellum. 4. Atrophy and white matter disease is moderately advanced for age. This likely reflects the sequela of chronic microvascular ischemia. 5. Degenerative changes in the cervical spine with central canal stenosis at C3-4. These results were called by telephone at the time of interpretation on 05/14/2021 at 5:24 pm to provider Deno Etienne, who verbally acknowledged these results. Electronically Signed   By: San Morelle M.D.   On: 05/14/2021 17:29   DG Chest Port 1 View  Result Date: 05/14/2021 CLINICAL DATA:  Stroke EXAM: PORTABLE CHEST 1 VIEW COMPARISON:  09/11/2020 FINDINGS: The heart size and mediastinal contours are within normal limits. Both lungs are clear. The visualized skeletal structures are unremarkable. IMPRESSION: No active disease. Electronically Signed   By: Donavan Foil M.D.   On: 05/14/2021 22:07   ECHOCARDIOGRAM COMPLETE BUBBLE STUDY  Result Date: 05/15/2021    ECHOCARDIOGRAM REPORT   Patient Name:   ROSHAWN AYALA Date of Exam: 05/15/2021 Medical Rec #:  [id number]       Height:        67.0 in Accession #:    [id number]      Weight:       142.2 lb Date of Birth:  May 07, 1956       BSA:          1.749 m Patient Age:    40 years        BP:           167/94 mmHg Patient Gender: M               HR:           60 bpm. Exam Location:  Inpatient Procedure: 2D Echo, Cardiac Doppler, Color Doppler and Saline Contrast Bubble            Study Indications:    Stroke I63.9  History:        Patient has no prior history of Echocardiogram examinations.                 Risk Factors:Hypertension, Diabetes, Dyslipidemia and Current                 Smoker. Alcohol abuse. GERD.  Sonographer:    Jonelle Sidle Dance Referring Phys: [id number] Siren  1. Left ventricular ejection fraction, by estimation, is 60 to  65%. The left ventricle has normal function. The left ventricle has no regional wall motion abnormalities. There is mild left ventricular hypertrophy of the basal-septal segment. Left ventricular diastolic parameters are consistent with Grade I diastolic dysfunction (impaired relaxation).  2. Right ventricular systolic function is normal. The right ventricular size is normal.  3. Unusual echogenicity in the left atrium appears fixed and is probably a prominent "coumadin ridge", rather than a thrombus.  4. The mitral valve is normal in structure. No evidence of mitral valve regurgitation. No evidence of mitral stenosis.  5. The aortic valve is normal in structure. Aortic valve regurgitation is not visualized. No aortic stenosis is present.  6. The inferior vena cava is normal in size with greater than 50% respiratory variability, suggesting right atrial pressure of 3 mmHg.  7. Agitated saline contrast bubble study was negative, with no evidence of any interatrial shunt. Conclusion(s)/Recommendation(s): No intracardiac source of embolism detected on this transthoracic study. A transesophageal echocardiogram is recommended to exclude cardiac source of embolism if clinically indicated. FINDINGS  Left  Ventricle: Left ventricular ejection fraction, by estimation, is 60 to 65%. The left ventricle has normal function. The left ventricle has no regional wall motion abnormalities. The left ventricular internal cavity size was normal in size. There is  mild left ventricular hypertrophy of the basal-septal segment. Left ventricular diastolic parameters are consistent with Grade I diastolic dysfunction (impaired relaxation). Normal left ventricular filling pressure. Right Ventricle: The right ventricular size is normal. No increase in right ventricular wall thickness. Right ventricular systolic function is normal. Left Atrium: Unusual echogenicity in the left atrium appears fixed and is probably a prominent "coumadin ridge", rather than a thrombus. Left atrial size was normal in size. Right Atrium: Right atrial size was normal in size. Pericardium: There is no evidence of pericardial effusion. Mitral Valve: The mitral valve is normal in structure. No evidence of mitral valve regurgitation. No evidence of mitral valve stenosis. Tricuspid Valve: The tricuspid valve is normal in structure. Tricuspid valve regurgitation is not demonstrated. No evidence of tricuspid stenosis. Aortic Valve: The aortic valve is normal in structure. Aortic valve regurgitation is not visualized. No aortic stenosis is present. Pulmonic Valve: The pulmonic valve was normal in structure. Pulmonic valve regurgitation is not visualized. No evidence of pulmonic stenosis. Aorta: The aortic root is normal in size and structure. Venous: The inferior vena cava is normal in size with greater than 50% respiratory variability, suggesting right atrial pressure of 3 mmHg. IAS/Shunts: No atrial level shunt detected by color flow Doppler. Agitated saline contrast was given intravenously to evaluate for intracardiac shunting. Agitated saline contrast bubble study was negative, with no evidence of any interatrial shunt.  LEFT VENTRICLE PLAX 2D LVIDd:         4.40  cm  Diastology LVIDs:         2.90 cm  LV e' medial:    5.87 cm/s LV PW:         1.40 cm  LV E/e' medial:  11.3 LV IVS:        1.10 cm  LV e' lateral:   8.70 cm/s LVOT diam:     2.10 cm  LV E/e' lateral: 7.6 LV SV:         81 LV SV Index:   46 LVOT Area:     3.46 cm  RIGHT VENTRICLE             IVC RV Basal diam:  2.40 cm  IVC diam: 1.10 cm RV S prime:     12.10 cm/s TAPSE (M-mode): 2.6 cm LEFT ATRIUM             Index       RIGHT ATRIUM           Index LA diam:        4.20 cm 2.40 cm/m  RA Area:     14.90 cm LA Vol (A2C):   58.5 ml 33.44 ml/m RA Volume:   33.80 ml  19.32 ml/m LA Vol (A4C):   44.9 ml 25.67 ml/m LA Biplane Vol: 51.0 ml 29.15 ml/m  AORTIC VALVE LVOT Vmax:   129.00 cm/s LVOT Vmean:  89.100 cm/s LVOT VTI:    0.234 m  AORTA Ao Root diam: 3.10 cm Ao Asc diam:  2.90 cm MITRAL VALVE MV Area (PHT): 2.83 cm    SHUNTS MV Decel Time: 268 msec    Systemic VTI:  0.23 m MV E velocity: 66.20 cm/s  Systemic Diam: 2.10 cm MV A velocity: 95.20 cm/s MV E/A ratio:  0.70 Mihai Croitoru MD Electronically signed by Sanda Klein MD Signature Date/Time: 05/15/2021/4:38:34 PM    Final     Microbiology: Recent Results (from the past 240 hour(s))  Resp Panel by RT-PCR (Flu A&B, Covid) Nasopharyngeal Swab     Status: None   Collection Time: 05/14/21  1:54 PM   Specimen: Nasopharyngeal Swab; Nasopharyngeal(NP) swabs in vial transport medium  Result Value Ref Range Status   SARS Coronavirus 2 by RT PCR NEGATIVE NEGATIVE Final    Comment: (NOTE) SARS-CoV-2 target nucleic acids are NOT DETECTED.  The SARS-CoV-2 RNA is generally detectable in upper respiratory specimens during the acute phase of infection. The lowest concentration of SARS-CoV-2 viral copies this assay can detect is 138 copies/mL. A negative result does not preclude SARS-Cov-2 infection and should not be used as the sole basis for treatment or other patient management decisions. A negative result may occur with  improper specimen  collection/handling, submission of specimen other than nasopharyngeal swab, presence of viral mutation(s) within the areas targeted by this assay, and inadequate number of viral copies(<138 copies/mL). A negative result must be combined with clinical observations, patient history, and epidemiological information. The expected result is Negative.  Fact Sheet for Patients:  EntrepreneurPulse.com.au  Fact Sheet for Healthcare Providers:  IncredibleEmployment.be  This test is no t yet approved or cleared by the [place] FDA and  has been authorized for detection and/or diagnosis of SARS-CoV-2 by FDA under an Emergency Use Authorization (EUA). This EUA will remain  in effect (meaning this test can be used) for the duration of the COVID-19 declaration under Section 564(b)(1) of the Act, 21 U.S.C.section 360bbb-3(b)(1), unless the authorization is terminated  or revoked sooner.       Influenza A by PCR NEGATIVE NEGATIVE Final   Influenza B by PCR NEGATIVE NEGATIVE Final    Comment: (NOTE) The Xpert Xpress SARS-CoV-2/FLU/RSV plus assay is intended as an aid in the diagnosis of influenza from Nasopharyngeal swab specimens and should not be used as a sole basis for treatment. Nasal washings and aspirates are unacceptable for Xpert Xpress SARS-CoV-2/FLU/RSV testing.  Fact Sheet for Patients: EntrepreneurPulse.com.au  Fact Sheet for Healthcare Providers: IncredibleEmployment.be  This test is not yet approved or cleared by the [place] FDA and has been authorized for detection and/or diagnosis of SARS-CoV-2 by FDA under an Emergency Use Authorization (EUA). This EUA will remain in effect (meaning this test can be  used) for the duration of the COVID-19 declaration under Section 564(b)(1) of the Act, 21 U.S.C. section 360bbb-3(b)(1), unless the authorization is terminated or revoked.  Performed at [facility], [place] [address], [place], [place] [address]      Labs: Basic Metabolic Panel: Recent Labs  Lab 05/14/21 1321 05/14/21 1957 05/15/21 0902  NA 134*  --  132*  K 3.7  --  3.3*  CL 101  --  101  CO2 22  --  25  GLUCOSE 98  --  143*  BUN 8  --  6*  CREATININE 0.75  --  0.73  CALCIUM 9.4  --  9.3  MG  --  1.9  --   PHOS  --  3.4  --    Liver Function Tests: Recent Labs  Lab 05/14/21 1321 05/15/21 0902  AST 34 34  ALT 24 24  ALKPHOS 79 63  BILITOT 0.7 1.2  PROT 7.8 7.3  ALBUMIN 3.9 3.5   CBC: Recent Labs  Lab 05/14/21 1321 05/15/21 0902  WBC 10.7* 10.1  NEUTROABS 5.7 5.0  HGB 16.6 16.7  HCT 48.0 47.2  MCV 95.6 94.6  PLT 220 211   CBG: Recent Labs  Lab 05/15/21 1956 05/15/21 2357 05/16/21 0435 05/16/21 0746 05/16/21 1216  GLUCAP 140* 103* 121* 125* 112*    Principal Problem:   CVA (cerebral vascular accident) ([place]) Active Problems:   Hypercholesteremia   Essential hypertension   Alcohol abuse   Tobacco abuse   DM2 (diabetes mellitus, type 2) ([place])   Alcoholic intoxication without complication ([place])   Marijuana use   Time coordinating discharge: 45 minutes  Signed:  Murray Hodgkins, MD  [facility]  05/16/2021, 7:13 PM

## 2021-05-16 NOTE — Evaluation (Signed)
Speech Language Pathology Evaluation Patient Details Name: [name] MRN: [id number] DOB: 1955-12-12 Today's Date: 05/16/2021 Time: 6301-6010 SLP Time Calculation (min) (ACUTE ONLY): 23 min  Problem List:  Patient Active Problem List   Diagnosis Date Noted   CVA (cerebral vascular accident) ([place]) 05/14/2021   Alcohol abuse 05/14/2021   Tobacco abuse 05/14/2021   DM2 (diabetes mellitus, type 2) ([place]) 05/14/2021   Angioedema 02/09/2017   Primary localized osteoarthritis of right hip 01/02/2016   Groin rash 93/23/5573   DM w/o complication type II ([place]) 06/16/2013   BPH (benign prostatic hyperplasia) 11/24/2012   Healthcare maintenance 11/24/2012   Pancreatitis 10/31/2012   Chronic pain syndrome 09/18/2011   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/19/2011   Right rotator cuff tear 07/26/2011   Shoulder pain, right 05/03/2011   Right ankle pain 01/23/2011   Left wrist pain 01/23/2011   Back pain 01/02/2011   MOLLUSCUM CONTAGIOSUM 12/05/2008   LEUKOCYTOSIS UNSPECIFIED 03/25/2008   LIVER FUNCTION TESTS, ABNORMAL 03/25/2008   ERECTILE DYSFUNCTION 05/12/2007   Essential hypertension 03/02/2007   Hypercholesteremia 01/08/2007   ANXIETY 01/08/2007   GERD 01/08/2007   Rheumatoid arthritis ([place]) 01/08/2007   OSTEOARTHRITIS 01/08/2007   Past Medical History:  Past Medical History:  Diagnosis Date   Alcohol abuse    Allergic rhinitis    Anxiety    With Post traumatic stress disorder   Diabetes mellitus without complication ([place])    'sometime last yr'--type 2   GERD (gastroesophageal reflux disease)    Headache    "like migraines"   Hepatitis    he thinks its hep b or c   Hyperlipidemia    Hypertension    Osteoarthritis    Pancreatitis    Rheumatoid arthritis(714.0)    Tobacco user    Past Surgical History:  Past Surgical History:  Procedure Laterality Date   COLONOSCOPY     FRACTURE SURGERY     cheek bone fracture    rtc     right shoulder  -- 2010   TOTAL HIP ARTHROPLASTY Right  01/02/2016   Procedure: TOTAL HIP ARTHROPLASTY ANTERIOR APPROACH;  Surgeon: Renette Butters, MD;  Location: [facility];  Service: Orthopedics;  Laterality: Right;   WRIST SURGERY     fusion with pins  2007   HPI:  HAYGEN ZEBROWSKI is a 65 y.o. male with medical history significant of EtOH abuse, PTSD, DM2, HLD, HTN, HepC RA, pancreatitis, tobacco abuse        Presented with  slurred speech and Right side weakness, facial droop LSN 4 days ago. Now slurred speech got better but he still has Right side weakness. MRI head on 05/14/21 indicated Acute/subacute 8.5 mm nonhemorrhagic infarct in the left pons.  2. Remote nonhemorrhagic lacunar infarct of the right thalamus.  3. Remote hemorrhagic infarct in the posterior right cerebellum; Speech/language evaluation generated.  Assessment / Plan / Recommendation Clinical Impression  Pt assessed via SLUMS ([place] Mental Status Examination) with a score of 27/30 obtained which falls within the normal range for this assessment (27-30/30).  He stated "forgetfulness" at baseline, but pt was able to complete most memory tasks without difficulty; he recalled 4/5 words without a cue and with a category cue, he was able to recall last word.  He achieved 75% with auditory recall of paragraph information, which is likely his baseline.  Pt aware of current deficits, speech was 100% intelligible and he was able to complete attention tasks, word fluency and orientation questions without difficulty.  No  ST recommended at this time as pt is functioning likely at baseline.  Thank you for this consult.    SLP Assessment  SLP Recommendation/Assessment: Patient does not need any further Speech Language Pathology Services SLP Visit Diagnosis: Cognitive communication deficit (R41.841)    Follow Up Recommendations  None    Frequency and Duration   N/a        SLP Evaluation Cognition  Overall Cognitive Status: Within Functional Limits for tasks  assessed Arousal/Alertness: Awake/alert Orientation Level: Oriented X4 Attention: Sustained Sustained Attention: Appears intact Memory: Appears intact Immediate Memory Recall: Sock;Blue;Bed Memory Recall Sock: Without Cue Memory Recall Blue: Without Cue Memory Recall Bed: Without Cue Awareness: Appears intact Problem Solving: Appears intact Safety/Judgment: Appears intact       Comprehension  Auditory Comprehension Overall Auditory Comprehension: Appears within functional limits for tasks assessed Conversation: Complex Interfering Components: Pain Visual Recognition/Discrimination Discrimination: Within Function Limits Reading Comprehension Reading Status: Within funtional limits    Expression Expression Primary Mode of Expression: Verbal Verbal Expression Overall Verbal Expression: Appears within functional limits for tasks assessed Level of Generative/Spontaneous Verbalization: Conversation Repetition: No impairment Naming: No impairment Pragmatics: No impairment Non-Verbal Means of Communication: Not applicable Written Expression Dominant Hand: Right Written Expression: Within Functional Limits   Oral / Motor  Oral Motor/Sensory Function Overall Oral Motor/Sensory Function: Within functional limits Motor Speech Overall Motor Speech: Appears within functional limits for tasks assessed Respiration: Within functional limits Phonation: Normal Resonance: Within functional limits Articulation: Within functional limitis Intelligibility: Intelligible Motor Planning: Witnin functional limits Motor Speech Errors: Not applicable                       Elvina Sidle, M.S., CCC-SLP 05/16/2021, 12:50 PM

## 2021-05-16 NOTE — TOC Transition Note (Signed)
Transition of Care [facility]) - CM/SW Discharge Note   Patient Details  Name: [name] MRN: [id number] Date of Birth: 1956/10/03  [facility]) CM/SW Contact:  Pollie Friar, RN Phone Number: 05/16/2021, 12:04 PM   Clinical Narrative:    Patient is discharging home with self care. No f/u per PT and no DME needs.  Pt has seen [facility] in the past and would like to get back into their clinic. CM has left a voicemail for their admissions person who will call the patient to schedule an appointment.  Pt states he has transport home today. He states his family will check in on him daily.   Final next level of care: Home/Self Care Barriers to Discharge: No Barriers Identified   Patient Goals and CMS Choice        Discharge Placement                       Discharge Plan and Services                                     Social Determinants of Health (SDOH) Interventions     Readmission Risk Interventions No flowsheet data found.

## 2021-05-17 ENCOUNTER — Telehealth: Payer: Self-pay

## 2021-05-17 NOTE — Telephone Encounter (Signed)
Transition Care Management Unsuccessful Follow-up Telephone Call  Date of discharge and from where:  05/16/2021-[facility]   Attempts:  1st Attempt  Reason for unsuccessful TCM follow-up call:  Left voice message

## 2021-05-18 NOTE — Telephone Encounter (Signed)
Transition Care Management Unsuccessful Follow-up Telephone Call  Date of discharge and from where:  05/16/2021-   Attempts:  2nd Attempt  Reason for unsuccessful TCM follow-up call:  Left voice message

## 2021-05-21 NOTE — Telephone Encounter (Signed)
Transition Care Management Follow-up Telephone Call Date of discharge and from where: 05/16/2021 from [facility] How have you been since you were released from the hospital? Pt states that he is feeling better. Pt had questions about how to schedule his appts with the Neurologist [facility]).  Any questions or concerns? No  Items Reviewed: Did the pt receive and understand the discharge instructions provided? Yes  Medications obtained and verified? Yes  Other? No  Any new allergies since your discharge? No  Dietary orders reviewed? No Do you have support at home? Yes   Functional Questionnaire: (I = Independent and D = Dependent) ADLs: I Bathing/Dressing- I Meal Prep- I Eating- I Maintaining continence- I Transferring/Ambulation- I Managing Meds- I  Follow up appointments reviewed:  PCP Hospital f/u appt confirmed? No  Pt will call PCP for an appointment.  [facility] f/u appt confirmed? Yes  Pt will [facility] for an appointment Are transportation arrangements needed? No  If their condition worsens, is the pt aware to call PCP or go to the Emergency Dept.? Yes Was the patient provided with contact information for the PCP's office or ED? Yes Was to pt encouraged to call back with questions or concerns? Yes

## 2021-05-24 ENCOUNTER — Other Ambulatory Visit: Payer: Self-pay | Admitting: *Deleted

## 2021-05-24 NOTE — Patient Outreach (Signed)
[facility]) Care Management  05/24/2021  REIK INGERSON 27-Apr-1956 [id number]   RED ON EMMI ALERT - Stroke Day # 6 Date: 7/27 Red Alert Reason: Smoke or been around smoke   Outreach attempt #1, successful.  Identity verified.  This care manager introduced self and stated purpose of call.  [facility] care management services explained.    Member reports he is doing well, lives alone but has family and friends for support.  He has been monitoring his blood pressure daily, state readings have been 140's/70-80's.  Medication list reviewed, state he has not received MVI or Folic Acid. Advised that these may be OTC.  Encouraged to compare discharge instructions against medication bottles in the home, verbalized understanding.  Has follow up appointment with neurology on 10/19, will call to schedule visit with PCP.  He is able to drive self to follow up, if he is not comfortable driving, will ask family for ride.  Admits that he is still smoking but report he has cut down to 1 cigarette/day.  State it is difficult to stop being around people that smoke due to family/friends' behaviors.  Understands importance of smoking cessation and risk of smoking, will discuss options with PCP.  Denies any urgent concerns, encouraged to contact this care manager with questions.   Plan: RN CM will send member education regarding stroke prevention and smoking risks.  Will follow up within the next 2 weeks.  [name], [place], MSN [place] [phone]

## 2021-06-07 ENCOUNTER — Other Ambulatory Visit: Payer: Self-pay | Admitting: *Deleted

## 2021-06-07 DIAGNOSIS — I639 Cerebral infarction, unspecified: Secondary | ICD-10-CM

## 2021-06-07 NOTE — Patient Outreach (Signed)
[facility]) Care Management  06/07/2021  [name] 08/12/1956 [id number]   Outgoing call placed to member to follow up on stroke recovery.  Report he is doing well, changing his diet but continues to smoke 1 cigarette a day.  He is aware of the risk of smoking, attempting to quit.  Has follow up appointments with PCP 9/8 and neurology 10/19, denies further questions at this time.  Will place referral to Managed Medicaid team for ongoing disease management.  [name], [place], MSN [place] [phone]

## 2021-06-14 ENCOUNTER — Other Ambulatory Visit: Payer: Self-pay

## 2021-06-14 ENCOUNTER — Telehealth: Payer: Self-pay | Admitting: Nurse Practitioner

## 2021-06-14 NOTE — Patient Instructions (Signed)
Visit Information  [name] was given information about Medicaid Managed Care team care coordination services as a part of their [place] Medicaid benefit. [name] verbally consented to engagement with the [facility] Managed Care team.   If you are experiencing a medical emergency, please call 911 or report to your local emergency department or urgent care.   If you have a non-emergency medical problem during routine business hours, please contact your provider's office and ask to speak with a nurse.   For questions related to your [facility], please call: [phone] or visit the homepage here: [email or web address]  If you would like to schedule transportation through your [facility], please call the following number at least 2 days in advance of your appointment: [phone].   Call the [place] at [phone], at any time, 24 hours a day, 7 days a week. If you are in danger or need immediate medical attention call 911.  If you would like help to quit smoking, call 1-800-QUIT-NOW 573-226-2437) OR Espaol: 1-855-Djelo-Ya (5-170-017-4944) o para ms informacin haga clic aqu or Text READY to 200-400 to register via text  [name] - following are the goals we discussed in your visit today:   Goals Addressed             This Visit's Progress    Blood Pressure < 140/90       Timeframe:  Long-Range Goal Priority:  High Start Date: 06/13/2021                       Expected End Date:   10/14/2021     Patient will self administer medications as prescribed Patient will attend all scheduled provider appointments Patient will call pharmacy for medication refills   Patient will check blood pressure at home and record.       HLD Management       Timeframe:  Long-Range Goal Priority:  High Start Date: 06/14/2021       Expected  End Date:  10/14/2021                  Patient will self administer medications as prescribed Patient will attend all scheduled provider appointments Patient will call pharmacy for medication refills                     RNCM Blood sugar monitoring and management       Timeframe:  Long-Range Goal Priority:  High Start Date:  06/14/2021                           Expected End Date:  10/14/2021           Patient will self administer medications as prescribed Patient will attend all scheduled provider appointments Patient will call pharmacy for medication refills              Please see education materials related to low cholesterol diet and healthy diet provided as print materials.   The patient verbalized understanding of instructions provided today and agreed to receive a mailed copy of patient instruction and/or educational materials.  The Managed Medicaid care management team will reach out to the patient again over the next 30 days.   [name], BSN Community Care Coordinator [facility] Mobile: [phone]   Following is a copy of your plan  of care:  Patient Care Plan: RN Care Manger Plan of Care     Problem Identified: Chronic Disease Management and Care Coordination Needs for CVA, HTN, HLD and DM II.      Long-Range Goal: Development of Plan of Care for Chronic Disease Management and Care Coordination Needs   Start Date: 06/14/2021  Expected End Date: 10/12/2021  Priority: High  Note:   Current Barriers:  Knowledge Deficits related to plan of care for management of HTN, HLD, DMII, and CVA  Care Coordination needs related to Medication procurement and Memory Deficits  Chronic Disease Management support and education needs related to HTN, HLD, DMII, and CVA Non-adherence to prescribed medication regimen  RNCM Clinical Goal(s):  Patient will verbalize understanding of plan for management of HTN, HLD, DMII, and CVA verbalize  basic understanding of HTN, HLD, DMII, and CVA disease process and self health management plan   take all medications exactly as prescribed and will call provider for medication related questions attend all scheduled medical appointments: PCP appointment on 07/05/2021 at 10:40 am and Neurologist appointment on 08/15/2021 at 9:00 am continue to work with RN Care Manager to address care management and care coordination needs related to HTN, HLD, DMII, and CVA  work with pharmacist to address Medication procurement related to HTN, HLD, DMII, and CVA  through collaboration with Consulting civil engineer, provider, and care team.   Interventions: Inter-disciplinary care team collaboration (see longitudinal plan of care) Evaluation of current treatment plan related to  self management and patient's adherence to plan as established by provider   Diabetes:  (Status: New goal.) Lab Results  Component Value Date   HGBA1C 5.9 (H) 05/15/2021  Assessed patient's understanding of A1c goal: <7% Provided education to patient about basic DM disease process; Reviewed medications with patient and discussed importance of medication adherence;        Counseled on importance of regular laboratory monitoring as prescribed;        Discussed plans with patient for ongoing care management follow up and provided patient with direct contact information for care management team;      Reviewed scheduled/upcoming provider appointments including: PCP appointment on 07/05/2021 and Neurologist on 08/15/2021;         Advised patient, providing education and rationale, to check cbg 3 times a day once he obtains a blood glucose monitor and record        Referral made to pharmacy team for assistance with review of medication regime and obtaining vitamins and minerals as ordered;       Assessed social determinant of health barriers;         Hyperlipidemia:  (Status: New goal.) Lab Results  Component Value Date   CHOL 175 05/15/2021   HDL  47 05/15/2021   LDLCALC 76 05/15/2021   LDLDIRECT 96.5 05/14/2021   TRIG 258 (H) 05/15/2021   CHOLHDL 3.7 05/15/2021     Medication review performed; medication list updated in electronic medical record.  Provider established cholesterol goals reviewed; Counseled on importance of regular laboratory monitoring as prescribed; Provided HLD educational materials; Reviewed role and benefits of statin for ASCVD risk reduction; Reviewed importance of limiting foods high in cholesterol; Assessed social determinant of health barriers;  Plan to mail written information regarding low cholesterol diet and healthy diet.  Hypertension: (Status: New goal.) Last practice recorded BP readings:  BP Readings from Last 3 Encounters:  05/16/21 (!) 163/87  09/28/20 139/78  09/11/20 (!) 188/122  Most recent eGFR/CrCl: No results  found for: EGFR  No components found for: CRCL  Provided education to patient about basic DM disease process; Reviewed medications with patient and discussed importance of medication adherence;        Counseled on importance of regular laboratory monitoring as prescribed;        Discussed plans with patient for ongoing care management follow up and provided patient with direct contact information for care management team;      Reviewed scheduled/upcoming provider appointments including: PCP appointment on 07/05/2021 and Neurologist on 08/15/2021;              Assessed social determinant of health barriers;         Patient Goals/Self-Care Activities: Patient will self administer medications as prescribed Patient will attend all scheduled provider appointments Patient will call pharmacy for medication refills

## 2021-06-14 NOTE — Telephone Encounter (Signed)
..   Medicaid Managed Care   Unsuccessful Outreach Note  06/14/2021 Name: [name] MRN: [id number] DOB: October 13, 1956  Referred by: [name], NP Reason for referral : High Risk Managed Medicaid (I called the patient today to get him scheduled for aphone visit with the [facility] pharmacist. His VM was full.)   An unsuccessful telephone outreach was attempted today. The patient was referred to the case management team for assistance with care management and care coordination.   Follow Up Plan: The care management team will reach out to the patient again over the next 7 days.   [place]

## 2021-06-14 NOTE — Patient Outreach (Signed)
Medicaid Managed Care   Nurse Care Manager Note  06/14/2021 Name:  [name] MRN:  [id number] DOB:  [birth year]  [name] is an 65 y.o. year old male who is a primary patient of Brian Francois, NP.  The [facility] Managed Care Coordination team was consulted for assistance with:   HTN, HLD, DM II, CVA  [name] was given information about Medicaid Managed Care Coordination team services today. [name] Patient agreed to services and verbal consent obtained.  Engaged with patient by telephone for initial visit in response to provider referral for case management and/or care coordination services.   Assessments/Interventions:  Review of past medical history, allergies, medications, health status, including review of consultants reports, laboratory and other test data, was performed as part of comprehensive evaluation and provision of chronic care management services.  SDOH (Social Determinants of Health) assessments and interventions performed: SDOH Interventions    Flowsheet Row Most Recent Value  SDOH Interventions   SDOH Interventions for the Following Domains Tobacco  Food Insecurity Interventions Intervention Not Indicated  Financial Strain Interventions Intervention Not Indicated  Housing Interventions Intervention Not Indicated  Stress Interventions Intervention Not Indicated  Social Connections Interventions Intervention Not Indicated  Tobacco Interventions Intervention Not Indicated  Transportation Interventions Intervention Not Indicated       Care Plan  Allergies  Allergen Reactions   Lisinopril Other (See Comments)    angioedema   Citalopram Nausea Only   Morphine Nausea And Vomiting   Nsaids Other (See Comments)    Stomach Irritability    Paroxetine     Other reaction(s): NAUSEA,VOMITING    Medications Reviewed Today     Reviewed by [name], RN (Case Manager) on 06/14/21 at 1102  Med List Status: <None>   Medication Order  Taking? Sig Documenting Provider Last Dose Status Informant  amLODipine (NORVASC) 5 MG tablet [id number] Yes Take 1 tablet (5 mg total) by mouth daily. [name], MD Taking Active   atorvastatin (LIPITOR) 40 MG tablet [id number] Yes Take 1 tablet (40 mg total) by mouth daily. [name], MD Taking Active   clopidogrel (PLAVIX) 75 MG tablet [id number] Yes Take 1 tablet (75 mg total) by mouth daily. [name], MD Taking Active   folic acid (FOLVITE) 1 MG tablet [id number] No Take 1 tablet (1 mg total) by mouth daily.  Patient not taking: Reported on 06/14/2021   [name], MD Not Taking Active   hydrochlorothiazide (MICROZIDE) 12.5 MG capsule [id number] Yes Take 1 capsule (12.5 mg total) by mouth daily. [name], MD Taking Active   metFORMIN (GLUCOPHAGE) 500 MG tablet [id number] Yes Take 1 tablet (500 mg total) by mouth daily with breakfast. [name], MD Taking Active   Multiple Vitamin (MULTIVITAMIN WITH MINERALS) TABS tablet [id number] No Take 1 tablet by mouth daily.  Patient not taking: Reported on 06/14/2021   [name], MD Not Taking Active   thiamine 100 MG tablet [id number] No Take 1 tablet (100 mg total) by mouth daily.  Patient not taking: Reported on 06/14/2021   [name], MD Not Taking Active             Patient Active Problem List   Diagnosis Date Noted   Alcoholic intoxication without complication ([place]) 03/00/9233   Marijuana use 05/16/2021   CVA (cerebral vascular accident) ([place]) 05/14/2021   Alcohol abuse 05/14/2021   Tobacco abuse 05/14/2021   DM2 (diabetes mellitus, type 2) ([place])  05/14/2021   Angioedema 02/09/2017   Primary localized osteoarthritis of right hip 01/02/2016   Groin rash 75/17/0017   DM w/o complication type II ([place]) 06/16/2013   BPH (benign prostatic hyperplasia) 11/24/2012   Healthcare maintenance 11/24/2012   Pancreatitis 10/31/2012   Chronic pain syndrome 09/18/2011   Frozen shoulder  08/19/2011   Right rotator cuff tear 07/26/2011   Shoulder pain, right 05/03/2011   Right ankle pain 01/23/2011   Left wrist pain 01/23/2011   Back pain 01/02/2011   MOLLUSCUM CONTAGIOSUM 12/05/2008   LEUKOCYTOSIS UNSPECIFIED 03/25/2008   LIVER FUNCTION TESTS, ABNORMAL 03/25/2008   ERECTILE DYSFUNCTION 05/12/2007   Essential hypertension 03/02/2007   Hypercholesteremia 01/08/2007   ANXIETY 01/08/2007   GERD 01/08/2007   Rheumatoid arthritis ([place]) 01/08/2007   OSTEOARTHRITIS 01/08/2007    Conditions to be addressed/monitored per PCP order:  HTN, HLD, DMII, and CVA  Care Plan : RN Care Manger Plan of Care  Updates made by [name], RN since 06/14/2021 12:00 AM     Problem: Chronic Disease Management and Care Coordination Needs for CVA, HTN, HLD and DM II.      Long-Range Goal: Development of Plan of Care for Chronic Disease Management and Care Coordination Needs   Start Date: 06/14/2021  Expected End Date: 10/12/2021  Priority: High  Note:   Current Barriers:  Knowledge Deficits related to plan of care for management of HTN, HLD, DMII, and CVA  Care Coordination needs related to Medication procurement and Memory Deficits  Chronic Disease Management support and education needs related to HTN, HLD, DMII, and CVA Non-adherence to prescribed medication regimen  RNCM Clinical Goal(s):  Patient will verbalize understanding of plan for management of HTN, HLD, DMII, and CVA verbalize basic understanding of HTN, HLD, DMII, and CVA disease process and self health management plan   take all medications exactly as prescribed and will call provider for medication related questions attend all scheduled medical appointments: PCP appointment on 07/05/2021 at 10:40 am and Neurologist appointment on 08/15/2021 at 9:00 am continue to work with RN Care Manager to address care management and care coordination needs related to HTN, HLD, DMII, and CVA  work with pharmacist to address  Medication procurement related to HTN, HLD, DMII, and CVA  through collaboration with Consulting civil engineer, provider, and care team.   Interventions: Inter-disciplinary care team collaboration (see longitudinal plan of care) Evaluation of current treatment plan related to  self management and patient's adherence to plan as established by provider   Diabetes:  (Status: New goal.) Lab Results  Component Value Date   HGBA1C 5.9 (H) 05/15/2021  Assessed patient's understanding of A1c goal: <7% Provided education to patient about basic DM disease process; Reviewed medications with patient and discussed importance of medication adherence;        Counseled on importance of regular laboratory monitoring as prescribed;        Discussed plans with patient for ongoing care management follow up and provided patient with direct contact information for care management team;      Reviewed scheduled/upcoming provider appointments including: PCP appointment on 07/05/2021 and Neurologist on 08/15/2021;         Advised patient, providing education and rationale, to check cbg 3 times a day once he obtains a blood glucose monitor and record        Referral made to pharmacy team for assistance with review of medication regime and obtaining vitamins and minerals as ordered;       Assessed  social determinant of health barriers;         Hyperlipidemia:  (Status: New goal.) Lab Results  Component Value Date   CHOL 175 05/15/2021   HDL 47 05/15/2021   LDLCALC 76 05/15/2021   LDLDIRECT 96.5 05/14/2021   TRIG 258 (H) 05/15/2021   CHOLHDL 3.7 05/15/2021     Medication review performed; medication list updated in electronic medical record.  Provider established cholesterol goals reviewed; Counseled on importance of regular laboratory monitoring as prescribed; Provided HLD educational materials; Reviewed role and benefits of statin for ASCVD risk reduction; Reviewed importance of limiting foods high in  cholesterol; Assessed social determinant of health barriers;  Plan to mail written information regarding low cholesterol diet and healthy diet.  Hypertension: (Status: New goal.) Last practice recorded BP readings:  BP Readings from Last 3 Encounters:  05/16/21 (!) 163/87  09/28/20 139/78  09/11/20 (!) 188/122  Most recent eGFR/CrCl: No results found for: EGFR  No components found for: CRCL  Provided education to patient about basic DM disease process; Reviewed medications with patient and discussed importance of medication adherence;        Counseled on importance of regular laboratory monitoring as prescribed;        Discussed plans with patient for ongoing care management follow up and provided patient with direct contact information for care management team;      Reviewed scheduled/upcoming provider appointments including: PCP appointment on 07/05/2021 and Neurologist on 08/15/2021;              Assessed social determinant of health barriers;         Patient Goals/Self-Care Activities: Patient will self administer medications as prescribed Patient will attend all scheduled provider appointments Patient will call pharmacy for medication refills       Follow Up:  Patient agrees to Care Plan and Follow-up.  Plan: The Managed Medicaid care management team will reach out to the patient again over the next 30 days.  Date/time of next scheduled RN care management/care coordination outreach:  07/12/2021 at 10:00 am  [place] Mobile: [phone]

## 2021-06-25 ENCOUNTER — Other Ambulatory Visit: Payer: Self-pay

## 2021-06-25 NOTE — Patient Instructions (Signed)
Visit Information  [name] was given information about Medicaid Managed Care team care coordination services as a part of their [place] Medicaid benefit. [name] verbally consented to engagement with the [facility] Managed Care team.   If you are experiencing a medical emergency, please call 911 or report to your local emergency department or urgent care.   If you have a non-emergency medical problem during routine business hours, please contact your provider's office and ask to speak with a nurse.   For questions related to your [facility], please call: [phone] or visit the homepage here: [email or web address]  If you would like to schedule transportation through your [facility], please call the following number at least 2 days in advance of your appointment: [phone].   Call the [place] [facility] at [phone], at any time, 24 hours a day, 7 days a week. If you are in danger or need immediate medical attention call 911.  If you would like help to quit smoking, call 1-800-QUIT-NOW 724 799 0369) OR Espaol: 1-855-Djelo-Ya (4-481-856-3149) o para ms informacin haga clic aqu or Text READY to 200-400 to register via text  [name] - following are the goals we discussed in your visit today:   Goals Addressed   None     Please see education materials related to DM provided as print materials.   The patient verbalized understanding of instructions provided today and declined a print copy of patient instruction materials.   The  Patient                                              has been provided with contact information for the Managed Medicaid care management team and has been advised to call with any health related questions or concerns.   [place] Constable, Pharm.D., Managed Medicaid Pharmacist [phone]   Following is  a copy of your plan of care:  Patient Care Plan: [place] of Care     Problem Identified: Chronic Disease Management and Care Coordination Needs for CVA, HTN, HLD and DM II.      Long-Range Goal: Development of Plan of Care for Chronic Disease Management and Care Coordination Needs   Start Date: 06/14/2021  Expected End Date: 10/12/2021  Priority: High  Note:   Current Barriers:  Knowledge Deficits related to plan of care for management of HTN, HLD, DMII, and CVA  Care Coordination needs related to Medication procurement and Memory Deficits  Chronic Disease Management support and education needs related to HTN, HLD, DMII, and CVA Non-adherence to prescribed medication regimen  RNCM Clinical Goal(s):  Patient will verbalize understanding of plan for management of HTN, HLD, DMII, and CVA verbalize basic understanding of HTN, HLD, DMII, and CVA disease process and self health management plan   take all medications exactly as prescribed and will call provider for medication related questions attend all scheduled medical appointments: PCP appointment on 07/05/2021 at 10:40 am and Neurologist appointment on 08/15/2021 at 9:00 am continue to work with RN Care Manager to address care management and care coordination needs related to HTN, HLD, DMII, and CVA  work with pharmacist to address Medication procurement related to HTN, HLD, DMII, and CVA  through collaboration with Consulting civil engineer, provider, and care team.   Interventions: Inter-disciplinary care team collaboration (see  longitudinal plan of care) Evaluation of current treatment plan related to  self management and patient's adherence to plan as established by provider   Diabetes:  (Status: New goal.) Lab Results  Component Value Date   HGBA1C 5.9 (H) 05/15/2021  Assessed patient's understanding of A1c goal: <7% Provided education to patient about basic DM disease process; Reviewed medications with patient and discussed  importance of medication adherence;        Counseled on importance of regular laboratory monitoring as prescribed;        Discussed plans with patient for ongoing care management follow up and provided patient with direct contact information for care management team;      Reviewed scheduled/upcoming provider appointments including: PCP appointment on 07/05/2021 and Neurologist on 08/15/2021;         Advised patient, providing education and rationale, to check cbg 3 times a day once he obtains a blood glucose monitor and record        Referral made to pharmacy team for assistance with review of medication regime and obtaining vitamins and minerals as ordered;       Assessed social determinant of health barriers;         Hyperlipidemia:  (Status: New goal.) Lab Results  Component Value Date   CHOL 175 05/15/2021   HDL 47 05/15/2021   LDLCALC 76 05/15/2021   LDLDIRECT 96.5 05/14/2021   TRIG 258 (H) 05/15/2021   CHOLHDL 3.7 05/15/2021     Medication review performed; medication list updated in electronic medical record.  Provider established cholesterol goals reviewed; Counseled on importance of regular laboratory monitoring as prescribed; Provided HLD educational materials; Reviewed role and benefits of statin for ASCVD risk reduction; Reviewed importance of limiting foods high in cholesterol; Assessed social determinant of health barriers;  Plan to mail written information regarding low cholesterol diet and healthy diet.  Hypertension: (Status: New goal.) Last practice recorded BP readings:  BP Readings from Last 3 Encounters:  05/16/21 (!) 163/87  09/28/20 139/78  09/11/20 (!) 188/122  Most recent eGFR/CrCl: No results found for: EGFR  No components found for: CRCL  Provided education to patient about basic DM disease process; Reviewed medications with patient and discussed importance of medication adherence;        Counseled on importance of regular laboratory monitoring as  prescribed;        Discussed plans with patient for ongoing care management follow up and provided patient with direct contact information for care management team;      Reviewed scheduled/upcoming provider appointments including: PCP appointment on 07/05/2021 and Neurologist on 08/15/2021;              Assessed social determinant of health barriers;         Patient Goals/Self-Care Activities: Patient will self administer medications as prescribed Patient will attend all scheduled provider appointments Patient will call pharmacy for medication refills      Patient Care Plan: Medication Management     Problem Identified: Health Promotion or Disease Self-Management (General Plan of Care)      Goal: Medication Management   Note:   Current Barriers:  Does not maintain contact with provider office Does not contact provider office for questions/concerns   Pharmacist Clinical Goal(s):  Over the next 90 days, patient will achieve adherence to monitoring guidelines and medication adherence to achieve therapeutic efficacy contact provider office for questions/concerns as evidenced notation of same in electronic health record through collaboration with PharmD and provider.  Interventions: Inter-disciplinary care team collaboration (see longitudinal plan of care) Comprehensive medication review performed; medication list updated in electronic medical record     Health Maintenance:  Patient Goals/Self-Care Activities Over the next 90 days, patient will:  - take medications as prescribed collaborate with provider on medication access solutions  Follow Up Plan: The patient has been provided with contact information for the care management team and has been advised to call with any health related questions or concerns.      Task: Mutually Develop and Royce Macadamia Achievement of Patient Goals   Note:   Care Management Activities:    - verbalization of feelings encouraged    Notes:

## 2021-06-25 NOTE — Patient Outreach (Signed)
Medicaid Managed Care    Pharmacy Note  06/25/2021 Name: [name] MRN: [id number] DOB: 08-08-1956  [name] is a 65 y.o. year old male who is a primary care patient of [name], NP. [facility] Managed Care Coordination team was consulted for assistance with disease management and care coordination needs.    Engaged with patient Engaged with patient by telephone for initial visit in response to referral for case management and/or care coordination services.  [name] was given information about Managed Medicaid Care Coordination team services today. [name] agreed to services and verbal consent obtained.   Objective:  Lab Results  Component Value Date   CREATININE 0.73 05/15/2021   CREATININE 0.75 05/14/2021   CREATININE 0.98 09/28/2020    Lab Results  Component Value Date   HGBA1C 5.9 (H) 05/15/2021       Component Value Date/Time   CHOL 175 05/15/2021 0500   TRIG 258 (H) 05/15/2021 0500   HDL 47 05/15/2021 0500   CHOLHDL 3.7 05/15/2021 0500   VLDL 52 (H) 05/15/2021 0500   LDLCALC 76 05/15/2021 0500   LDLDIRECT 96.5 05/14/2021 1957    Other: (TSH, CBC, Vit D, etc.)  Clinical ASCVD: Yes  The ASCVD Risk score Mikey Bussing DC Jr., et al., 2013) failed to calculate for the following reasons:   The patient has a prior MI or stroke diagnosis    Other: (CHADS2VASc if Afib, PHQ9 if depression, MMRC or CAT for COPD, ACT, DEXA)  BP Readings from Last 3 Encounters:  05/16/21 (!) 163/87  09/28/20 139/78  09/11/20 (!) 188/122    Assessment/Interventions: Review of patient past medical history, allergies, medications, health status, including review of consultants reports, laboratory and other test data, was performed as part of comprehensive evaluation and provision of chronic care management services.   HTN BP Readings from Last 3 Encounters:  05/16/21 (!) 163/87  09/28/20 139/78  09/11/20 (!) 188/122   Amlodpine 50m HCTZ 12.536mAugust 2022:  Called patient, introduced myself, exchanged pleasantries. When I started to talk about meds he stated, "I'm in the grocery story, can we talk another time?" Will finish visit in October  Lipids Lab Results  Component Value Date   CHOL 175 05/15/2021   CHOL 163 02/12/2017   CHOL 150 02/10/2017   Lab Results  Component Value Date   HDL 47 05/15/2021   HDL 64 02/12/2017   HDL 43 02/10/2017   Lab Results  Component Value Date   LDLCALC 76 05/15/2021   LDLCALC 70 02/12/2017   LDLCALC 81 02/10/2017   Lab Results  Component Value Date   TRIG 258 (H) 05/15/2021   TRIG 144 02/12/2017   TRIG 132 02/10/2017   Lab Results  Component Value Date   CHOLHDL 3.7 05/15/2021   CHOLHDL 2.5 02/12/2017   CHOLHDL 3.5 02/10/2017   Lab Results  Component Value Date   LDLDIRECT 96.5 05/14/2021   Atorvastatin 4023mugust 2022: Called patient, introduced myself, exchanged pleasantries. When I started to talk about meds he stated, "I'm in the grocery story, can we talk another time?" Will finish visit in October  DM Lab Results  Component Value Date   HGBA1C 5.9 (H) 05/15/2021   HGBA1C 5.9 (H) 05/14/2021   HGBA1C 5.6 09/28/2020   HGBA1C 5.6 09/28/2020   HGBA1C 5.6 (A) 09/28/2020   HGBA1C 5.6 09/28/2020   Lab Results  Component Value Date   MICROALBUR 9.8 02/12/2017   [place] 05/15/2021   CREATININE  0.73 05/15/2021    Lab Results  Component Value Date   NA 132 (L) 05/15/2021   K 3.3 (L) 05/15/2021   CREATININE 0.73 05/15/2021   GFRNONAA >60 05/15/2021   GFRAA 94 09/28/2020   GLUCOSE 143 (H) 05/15/2021    Lab Results  Component Value Date   WBC 10.1 05/15/2021   HGB 16.7 05/15/2021   HCT 47.2 05/15/2021   MCV 94.6 05/15/2021   PLT 211 05/15/2021   Metformin 567m August 2022: Called patient, introduced myself, exchanged pleasantries. When I started to talk about meds he stated, "I'm in the grocery story, can we talk another time?" Will finish visit in  October  CAD Stroke on 05/14/21 Clopidogrel 715mASA 8165mtarting 05/17/21 for 21 days August 2022: Called patient, introduced myself, exchanged pleasantries. When I started to talk about meds he stated, "I'm in the grocery story, can we talk another time?" Will finish visit in October   SDOH (Social Determinants of Health) assessments and interventions performed:    Care Plan  Allergies  Allergen Reactions   Lisinopril Other (See Comments)    angioedema   Citalopram Nausea Only   Morphine Nausea And Vomiting   Nsaids Other (See Comments)    Stomach Irritability    Paroxetine     Other reaction(s): NAUSEA,VOMITING    Medications Reviewed Today     Reviewed by CavInge RiseN (Case Manager) on 06/14/21 at 11062ed List Status: <None>   Medication Order Taking? Sig Documenting Provider Last Dose Status Informant  amLODipine (NORVASC) 5 MG tablet [id number] Take 1 tablet (5 mg total) by mouth daily. GooSamuella CotaD Taking Active   atorvastatin (LIPITOR) 40 MG tablet [id number] Take 1 tablet (40 mg total) by mouth daily. GooSamuella CotaD Taking Active   clopidogrel (PLAVIX) 75 MG tablet [id number] Take 1 tablet (75 mg total) by mouth daily. GooSamuella CotaD Taking Active   folic acid (FOLVITE) 1 MG tablet [id number] Take 1 tablet (1 mg total) by mouth daily.  Patient not taking: Reported on 06/14/2021   GooSamuella CotaD Not Taking Active   hydrochlorothiazide (MICROZIDE) 12.5 MG capsule [id number] Take 1 capsule (12.5 mg total) by mouth daily. GooSamuella CotaD Taking Active   metFORMIN (GLUCOPHAGE) 500 MG tablet [id number] Take 1 tablet (500 mg total) by mouth daily with breakfast. GooSamuella CotaD Taking Active   Multiple Vitamin (MULTIVITAMIN WITH MINERALS) TABS tablet [id number] Take 1 tablet by mouth daily.  Patient not taking: Reported on 06/14/2021   GooSamuella CotaD Not Taking Active   thiamine 100 MG  tablet [id number] Take 1 tablet (100 mg total) by mouth daily.  Patient not taking: Reported on 06/14/2021   GooSamuella CotaD Not Taking Active             Patient Active Problem List   Diagnosis Date Noted   Alcoholic intoxication without complication ([place]CC[place]7/29/93/7169Marijuana use 05/16/2021   CVA (cerebral vascular accident) (HCCImogene7/18/2022   Alcohol abuse 05/14/2021   Tobacco abuse 05/14/2021   DM2 (diabetes mellitus, type 2) (HCCOliv[place]7/18/2022   Angioedema 02/09/2017   Primary localized osteoarthritis of right hip 01/02/2016   Groin rash 08/67/89/3810DM w/o complication type II (HCCHawkins8/20/2014   BPH (benign prostatic hyperplasia) 11/24/2012   Healthcare maintenance 11/24/2012   Pancreatitis 10/31/2012   Chronic pain syndrome 09/18/2011   Frozen  shoulder 08/19/2011   Right rotator cuff tear 07/26/2011   Shoulder pain, right 05/03/2011   Right ankle pain 01/23/2011   Left wrist pain 01/23/2011   Back pain 01/02/2011   MOLLUSCUM CONTAGIOSUM 12/05/2008   LEUKOCYTOSIS UNSPECIFIED 03/25/2008   LIVER FUNCTION TESTS, ABNORMAL 03/25/2008   ERECTILE DYSFUNCTION 05/12/2007   Essential hypertension 03/02/2007   Hypercholesteremia 01/08/2007   ANXIETY 01/08/2007   GERD 01/08/2007   Rheumatoid arthritis ([employer]) 01/08/2007   OSTEOARTHRITIS 01/08/2007    Conditions to be addressed/monitored: HTN, HLD, and DM  Patient Care Plan: RN Care Manger Plan of Care     Problem Identified: Chronic Disease Management and Care Coordination Needs for CVA, HTN, HLD and DM II.      Long-Range Goal: Development of Plan of Care for Chronic Disease Management and Care Coordination Needs   Start Date: 06/14/2021  Expected End Date: 10/12/2021  Priority: High  Note:   Current Barriers:  Knowledge Deficits related to plan of care for management of HTN, HLD, DMII, and CVA  Care Coordination needs related to Medication procurement and Memory Deficits  Chronic Disease Management support  and education needs related to HTN, HLD, DMII, and CVA Non-adherence to prescribed medication regimen  RNCM Clinical Goal(s):  Patient will verbalize understanding of plan for management of HTN, HLD, DMII, and CVA verbalize basic understanding of HTN, HLD, DMII, and CVA disease process and self health management plan   take all medications exactly as prescribed and will call provider for medication related questions attend all scheduled medical appointments: PCP appointment on 07/05/2021 at 10:40 am and Neurologist appointment on 08/15/2021 at 9:00 am continue to work with RN Care Manager to address care management and care coordination needs related to HTN, HLD, DMII, and CVA  work with pharmacist to address Medication procurement related to HTN, HLD, DMII, and CVA  through collaboration with Consulting civil engineer, provider, and care team.   Interventions: Inter-disciplinary care team collaboration (see longitudinal plan of care) Evaluation of current treatment plan related to  self management and patient's adherence to plan as established by provider   Diabetes:  (Status: New goal.) Lab Results  Component Value Date   HGBA1C 5.9 (H) 05/15/2021  Assessed patient's understanding of A1c goal: <7% Provided education to patient about basic DM disease process; Reviewed medications with patient and discussed importance of medication adherence;        Counseled on importance of regular laboratory monitoring as prescribed;        Discussed plans with patient for ongoing care management follow up and provided patient with direct contact information for care management team;      Reviewed scheduled/upcoming provider appointments including: PCP appointment on 07/05/2021 and Neurologist on 08/15/2021;         Advised patient, providing education and rationale, to check cbg 3 times a day once he obtains a blood glucose monitor and record        Referral made to pharmacy team for assistance with review of  medication regime and obtaining vitamins and minerals as ordered;       Assessed social determinant of health barriers;         Hyperlipidemia:  (Status: New goal.) Lab Results  Component Value Date   CHOL 175 05/15/2021   HDL 47 05/15/2021   LDLCALC 76 05/15/2021   LDLDIRECT 96.5 05/14/2021   TRIG 258 (H) 05/15/2021   CHOLHDL 3.7 05/15/2021     Medication review performed; medication list updated in electronic medical record.  Provider established cholesterol goals reviewed; Counseled on importance of regular laboratory monitoring as prescribed; Provided HLD educational materials; Reviewed role and benefits of statin for ASCVD risk reduction; Reviewed importance of limiting foods high in cholesterol; Assessed social determinant of health barriers;  Plan to mail written information regarding low cholesterol diet and healthy diet.  Hypertension: (Status: New goal.) Last practice recorded BP readings:  BP Readings from Last 3 Encounters:  05/16/21 (!) 163/87  09/28/20 139/78  09/11/20 (!) 188/122  Most recent eGFR/CrCl: No results found for: EGFR  No components found for: CRCL  Provided education to patient about basic DM disease process; Reviewed medications with patient and discussed importance of medication adherence;        Counseled on importance of regular laboratory monitoring as prescribed;        Discussed plans with patient for ongoing care management follow up and provided patient with direct contact information for care management team;      Reviewed scheduled/upcoming provider appointments including: PCP appointment on 07/05/2021 and Neurologist on 08/15/2021;              Assessed social determinant of health barriers;         Patient Goals/Self-Care Activities: Patient will self administer medications as prescribed Patient will attend all scheduled provider appointments Patient will call pharmacy for medication refills      Patient Care Plan: Medication  Management     Problem Identified: Health Promotion or Disease Self-Management (General Plan of Care)      Goal: Medication Management   Note:   Current Barriers:  Does not maintain contact with provider office Does not contact provider office for questions/concerns   Pharmacist Clinical Goal(s):  Over the next 90 days, patient will achieve adherence to monitoring guidelines and medication adherence to achieve therapeutic efficacy contact provider office for questions/concerns as evidenced notation of same in electronic health record through collaboration with PharmD and provider.    Interventions: Inter-disciplinary care team collaboration (see longitudinal plan of care) Comprehensive medication review performed; medication list updated in electronic medical record     Health Maintenance:  Patient Goals/Self-Care Activities Over the next 90 days, patient will:  - take medications as prescribed collaborate with provider on medication access solutions  Follow Up Plan: The patient has been provided with contact information for the care management team and has been advised to call with any health related questions or concerns.      Task: Mutually Develop and Royce Macadamia Achievement of Patient Goals   Note:   Care Management Activities:    - verbalization of feelings encouraged    Notes:      Medication Assistance: None required. Patient affirms current coverage meets needs.   Follow up: Agree   Plan: The patient has been provided with contact information for the care management team and has been advised to call with any health related questions or concerns.   [place] Constable, Pharm.D., Managed Medicaid Pharmacist - [phone]

## 2021-07-05 ENCOUNTER — Ambulatory Visit (INDEPENDENT_AMBULATORY_CARE_PROVIDER_SITE_OTHER): Payer: Medicaid Other | Admitting: Nurse Practitioner

## 2021-07-05 ENCOUNTER — Other Ambulatory Visit: Payer: Self-pay

## 2021-07-05 VITALS — BP 164/80 | HR 65 | Temp 98.1°F | Ht 67.0 in | Wt 149.0 lb

## 2021-07-05 DIAGNOSIS — N401 Enlarged prostate with lower urinary tract symptoms: Secondary | ICD-10-CM

## 2021-07-05 DIAGNOSIS — I10 Essential (primary) hypertension: Secondary | ICD-10-CM | POA: Diagnosis not present

## 2021-07-05 DIAGNOSIS — N3943 Post-void dribbling: Secondary | ICD-10-CM | POA: Diagnosis not present

## 2021-07-05 DIAGNOSIS — R8290 Unspecified abnormal findings in urine: Secondary | ICD-10-CM

## 2021-07-05 DIAGNOSIS — E785 Hyperlipidemia, unspecified: Secondary | ICD-10-CM

## 2021-07-05 DIAGNOSIS — E118 Type 2 diabetes mellitus with unspecified complications: Secondary | ICD-10-CM

## 2021-07-05 LAB — POCT URINALYSIS DIP (CLINITEK)
Bilirubin, UA: NEGATIVE
Glucose, UA: NEGATIVE mg/dL
Ketones, POC UA: NEGATIVE mg/dL
Leukocytes, UA: NEGATIVE
Nitrite, UA: NEGATIVE
POC PROTEIN,UA: 100 — AB
Spec Grav, UA: 1.02 (ref 1.010–1.025)
Urobilinogen, UA: 0.2 E.U./dL
pH, UA: 6 (ref 5.0–8.0)

## 2021-07-05 MED ORDER — ATORVASTATIN CALCIUM 40 MG PO TABS: 40.0000 mg | ORAL_TABLET | Freq: Every day | ORAL | 3 refills | Status: DC

## 2021-07-05 MED ORDER — AMLODIPINE BESYLATE 5 MG PO TABS: 5.0000 mg | ORAL_TABLET | Freq: Every day | ORAL | 3 refills | Status: DC

## 2021-07-05 MED ORDER — METFORMIN HCL 500 MG PO TABS: 500.0000 mg | ORAL_TABLET | Freq: Every day | ORAL | 3 refills | Status: DC

## 2021-07-05 MED ORDER — GABAPENTIN 300 MG PO CAPS: 300.0000 mg | ORAL_CAPSULE | Freq: Four times a day (QID) | ORAL | 3 refills | Status: DC

## 2021-07-05 MED ORDER — CLOPIDOGREL BISULFATE 75 MG PO TABS: 75.0000 mg | ORAL_TABLET | Freq: Every day | ORAL | 3 refills | Status: DC

## 2021-07-05 MED ORDER — FAMOTIDINE 20 MG PO TABS: 20.0000 mg | ORAL_TABLET | Freq: Two times a day (BID) | ORAL | 3 refills | Status: DC

## 2021-07-05 MED ORDER — TRAZODONE HCL 100 MG PO TABS: 100.0000 mg | ORAL_TABLET | Freq: Every day | ORAL | 3 refills | Status: DC

## 2021-07-05 MED ORDER — TAMSULOSIN HCL 0.4 MG PO CAPS: 0.8000 mg | ORAL_CAPSULE | Freq: Every day | ORAL | 3 refills | Status: DC

## 2021-07-05 MED ORDER — METOPROLOL TARTRATE 50 MG PO TABS: 50.0000 mg | ORAL_TABLET | Freq: Two times a day (BID) | ORAL | 3 refills | Status: DC

## 2021-07-05 MED ORDER — BLOOD GLUCOSE MONITOR KIT: PACK | 0 refills | Status: DC

## 2021-07-05 MED ORDER — HYDROCHLOROTHIAZIDE 12.5 MG PO CAPS: 12.5000 mg | ORAL_CAPSULE | Freq: Every day | ORAL | 3 refills | Status: DC

## 2021-07-05 NOTE — Progress Notes (Signed)
[place], [place]  [address] Phone:  [phone]   Fax:  [phone]   Established Patient Office Visit  Subjective:  Patient ID: [name], male    DOB: 1955/10/29  Age: 65 y.o. MRN: [id number]  CC:  Chief Complaint  Patient presents with  . Hospitalization Follow-up    05/14/2021 - 05/16/2021; Stroke. Pain in back and left  shoulder.    HPI [name] presents for follow-up.  He  has a past medical history of Alcohol abuse, Allergic rhinitis, Anxiety, Diabetes mellitus without complication ([place]), GERD (gastroesophageal reflux disease), Headache, Hepatitis, Hyperlipidemia, Hypertension, Osteoarthritis, Pancreatitis, Rheumatoid arthritis(714.0), and Tobacco user.   He is in today for a follow up. He established care 09/2020 and has no showed for several apts. He reports a stroke in June. He is dong ok from the stroke. He is concern with chronic pain. He reports that he has been compliant with his medication.   Past Medical History:  Diagnosis Date  . Alcohol abuse   . Allergic rhinitis   . Anxiety    With Post traumatic stress disorder  . Diabetes mellitus without complication [facility])    'sometime last yr'--type 2  . GERD (gastroesophageal reflux disease)   . Headache    "like migraines"  . Hepatitis    he thinks its hep b or c  . Hyperlipidemia   . Hypertension   . Osteoarthritis   . Pancreatitis   . Rheumatoid arthritis(714.0)   . Tobacco user     Past Surgical History:  Procedure Laterality Date  . COLONOSCOPY    . FRACTURE SURGERY     cheek bone fracture   . rtc     right shoulder  -- 2010  . 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   Procedure: TOTAL HIP ARTHROPLASTY ANTERIOR APPROACH;  Surgeon: Renette Butters, MD;  Location: [facility];  Service: Orthopedics;  Laterality: Right;  . WRIST SURGERY     fusion with pins  2007    Family History  Problem Relation Age of Onset  . Kidney disease Brother   .  Angioedema Maternal Aunt        Tongue swelling  . Heart attack Mother   . Heart attack Father     Social History   Socioeconomic History  . Marital status: Single    Spouse name: Not on file  . Number of children: Not on file  . Years of education: Not on file  . Highest education level: Not on file  Occupational History  . Not on file  Tobacco Use  . Smoking status: Every Day    Packs/day: 0.40    Years: 25.00    Pack years: 10.00    Types: Cigarettes    Passive exposure: Current  . Smokeless tobacco: Never  . Tobacco comments:    Trying to quit smoking.  Currently smoking 1 -2 cigarette per day and is doing "OK" with decrease.  Vaping Use  . Vaping Use: Never used  Substance and Sexual Activity  . Alcohol use: Yes    Alcohol/week: 7.0 - 14.0 standard drinks    Types: 7 - 14 Cans of beer per week    Comment: 1-2 beers occasionally  . Drug use: No  . Sexual activity: Yes  Other Topics Concern  . Not on file  Social History Narrative   Sharyon Cable. Married- 32 male sexual partner.   Denies drug use but has hx  of crack cocaine, alcohol and tobacco use.   Social Determinants of Health   Financial Resource Strain: Low Risk   . Difficulty of Paying Living Expenses: Not very hard  Food Insecurity: Food Insecurity Present  . Worried About Charity fundraiser in the Last Year: Sometimes true  . Ran Out of Food in the Last Year: Sometimes true  Transportation Needs: No Transportation Needs  . Lack of Transportation (Medical): No  . Lack of Transportation (Non-Medical): No  Physical Activity: Not on file  Stress: No Stress Concern Present  . Feeling of Stress : Only a little  Social Connections: Unknown  . Frequency of Communication with Friends and Family: More than three times a week  . Frequency of Social Gatherings with Friends and Family: More than three times a week  . Attends Religious Services: Not on file  . Active Member of Clubs or Organizations: Not on file   . Attends Archivist Meetings: Not on file  . Marital Status: Not on file  Intimate Partner Violence: Not on file    Outpatient Medications Prior to Visit  Medication Sig Dispense Refill  . folic acid (FOLVITE) 1 MG tablet Take 1 tablet (1 mg total) by mouth daily.    . Multiple Vitamin (MULTIVITAMIN WITH MINERALS) TABS tablet Take 1 tablet by mouth daily.    Marland Kitchen thiamine 100 MG tablet Take 1 tablet (100 mg total) by mouth daily.    Marland Kitchen amLODipine (NORVASC) 5 MG tablet Take 1 tablet (5 mg total) by mouth daily. 30 tablet 2  . atorvastatin (LIPITOR) 40 MG tablet Take 1 tablet (40 mg total) by mouth daily. 30 tablet 2  . clopidogrel (PLAVIX) 75 MG tablet Take 1 tablet (75 mg total) by mouth daily. 30 tablet 2  . famotidine (PEPCID) 20 MG tablet Take 20 mg by mouth 2 (two) times daily. Take one tablet by mouth two times a day    . gabapentin (NEURONTIN) 300 MG capsule Take 300 mg by mouth 3 (three) times daily. Take three capsules by mouth three times a day for tingling/pain    . guaiFENesin (MUCINEX) 600 MG 12 hr tablet Take by mouth 2 (two) times daily as needed. Take one by mouth two times a day as needed.    . hydrochlorothiazide (MICROZIDE) 12.5 MG capsule Take 1 capsule (12.5 mg total) by mouth daily. 30 capsule 2  . metFORMIN (GLUCOPHAGE) 500 MG tablet Take 1 tablet (500 mg total) by mouth daily with breakfast. 30 tablet 2  . metoprolol tartrate (LOPRESSOR) 50 MG tablet Take 50 mg by mouth 2 (two) times daily. Take one two times a day for blood pressure.    . traZODone (DESYREL) 100 MG tablet Take 100 mg by mouth at bedtime. Take one table by mouth at bedtime as needed for sleep.     No facility-administered medications prior to visit.    Allergies  Allergen Reactions  . Lisinopril Other (See Comments)    angioedema  . Citalopram Nausea Only  . Morphine Nausea And Vomiting  . Nsaids Other (See Comments)    Stomach Irritability   . Paroxetine     Other reaction(s):  NAUSEA,VOMITING    ROS Review of Systems  Musculoskeletal:        Sharpe pain on and off     Objective:    Physical Exam HENT:     Head: Normocephalic and atraumatic.     Nose: Nose normal.     Mouth/Throat:  Mouth: Mucous membranes are moist.  Cardiovascular:     Rate and Rhythm: Normal rate and regular rhythm.     Pulses: Normal pulses.     Heart sounds: Normal heart sounds.  Pulmonary:     Effort: Pulmonary effort is normal.     Comments: diminshed Abdominal:     Palpations: Abdomen is soft.  Musculoskeletal:        General: Normal range of motion.     Cervical back: Normal range of motion.     Right lower leg: No edema.     Left lower leg: No edema.  Skin:    General: Skin is warm.     Capillary Refill: Capillary refill takes less than 2 seconds.  Neurological:     General: No focal deficit present.     Mental Status: He is alert and oriented to person, place, and time.  Psychiatric:        Mood and Affect: Mood normal.        Behavior: Behavior normal.        Thought Content: Thought content normal.        Judgment: Judgment normal.    BP (!) 145/94 (BP Location: Right Arm, Patient Position: Sitting)   Pulse 69   Temp 98.1 F (36.7 C)   Ht '5\' 7"'  (1.702 m)   Wt 149 lb 0.2 oz (67.6 kg)   SpO2 92%   BMI 23.34 kg/m  Wt Readings from Last 3 Encounters:  07/05/21 149 lb 0.2 oz (67.6 kg)  05/14/21 142 lb 3.2 oz (64.5 kg)  09/28/20 142 lb 3.2 oz (64.5 kg)     Health Maintenance Due  Topic Date Due  . COVID-19 Vaccine (3 - Booster for Pfizer series) 11/08/2020    There are no preventive care reminders to display for this patient.  No results found for: TSH Lab Results  Component Value Date   WBC 10.1 05/15/2021   HGB 16.7 05/15/2021   HCT 47.2 05/15/2021   MCV 94.6 05/15/2021   PLT 211 05/15/2021   Lab Results  Component Value Date   NA 132 (L) 05/15/2021   K 3.3 (L) 05/15/2021   CO2 25 05/15/2021   GLUCOSE 143 (H) 05/15/2021   BUN 6  (L) 05/15/2021   CREATININE 0.73 05/15/2021   BILITOT 1.2 05/15/2021   ALKPHOS 63 05/15/2021   AST 34 05/15/2021   ALT 24 05/15/2021   PROT 7.3 05/15/2021   ALBUMIN 3.5 05/15/2021   CALCIUM 9.3 05/15/2021   ANIONGAP 6 05/15/2021   Lab Results  Component Value Date   CHOL 175 05/15/2021   Lab Results  Component Value Date   HDL 47 05/15/2021   Lab Results  Component Value Date   LDLCALC 76 05/15/2021   Lab Results  Component Value Date   TRIG 258 (H) 05/15/2021   Lab Results  Component Value Date   CHOLHDL 3.7 05/15/2021   Lab Results  Component Value Date   HGBA1C 5.9 (H) 05/15/2021      Assessment & Plan:   Problem List Items Addressed This Visit       Cardiovascular and Mediastinum   Essential hypertension Encouraged on going compliance with current medication regimen Encouraged home monitoring and recording BP <130/80 Eating a heart-healthy diet with less salt Encouraged regular physical activity  Recommend Weight loss     Relevant Medications   amLODipine (NORVASC) 5 MG tablet   atorvastatin (LIPITOR) 40 MG tablet   hydrochlorothiazide (MICROZIDE) 12.5 MG capsule  metoprolol tartrate (LOPRESSOR) 50 MG tablet   Other Relevant Orders   Comp. Metabolic Panel (12)   Other Visit Diagnoses     Hyperlipidemia, unspecified hyperlipidemia type    -  Primary   Relevant Medications   amLODipine (NORVASC) 5 MG tablet   atorvastatin (LIPITOR) 40 MG tablet   hydrochlorothiazide (MICROZIDE) 12.5 MG capsule   metoprolol tartrate (LOPRESSOR) 50 MG tablet   Other Relevant Orders   Lipid panel   Type 2 diabetes mellitus with complication, without long-term current use of insulin (HCC)     Stable Encourage compliance with current treatment regimen   Encourage regular CBG monitoring Encourage contacting office if excessive hyperglycemia and or hypoglycemia Lifestyle modification with healthy diet (fewer calories, more high fiber foods, whole grains and  non-starchy vegetables, lower fat meat and fish, low-fat diary include healthy oils) regular exercise (physical activity) and weight loss Opthalmology exam discussed  Nutritional consult recommended Regular dental visits encouraged Home BP monitoring also encouraged goal <130/80    Relevant Medications   atorvastatin (LIPITOR) 40 MG tablet   metFORMIN (GLUCOPHAGE) 500 MG tablet   Benign prostatic hyperplasia (BPH) with post-void dribbling     Worsening Trial as below   Relevant Medications   tamsulosin (FLOMAX) 0.4 MG CAPS capsule       Meds ordered this encounter  Medications  . blood glucose meter kit and supplies KIT    Sig: Dispense based on patient and insurance preference. Use up to four times daily as directed.    Dispense:  1 each    Refill:  0    Order Specific Question:   Supervising Provider    Answer:   Tresa Garter [id number]    Order Specific Question:   Number of strips    Answer:   100    Order Specific Question:   Number of lancets    Answer:   100  . amLODipine (NORVASC) 5 MG tablet    Sig: Take 1 tablet (5 mg total) by mouth daily.    Dispense:  90 tablet    Refill:  3    Order Specific Question:   Supervising Provider    Answer:   Tresa Garter [id number]  . atorvastatin (LIPITOR) 40 MG tablet    Sig: Take 1 tablet (40 mg total) by mouth daily.    Dispense:  90 tablet    Refill:  3    Order Specific Question:   Supervising Provider    Answer:   Tresa Garter [id number]  . clopidogrel (PLAVIX) 75 MG tablet    Sig: Take 1 tablet (75 mg total) by mouth daily.    Dispense:  90 tablet    Refill:  3    Order Specific Question:   Supervising Provider    Answer:   Tresa Garter [id number]  . famotidine (PEPCID) 20 MG tablet    Sig: Take 1 tablet (20 mg total) by mouth 2 (two) times daily. Take one tablet by mouth two times a day    Dispense:  180 tablet    Refill:  3    Order Specific Question:   Supervising Provider    Answer:    Tresa Garter [id number]  . gabapentin (NEURONTIN) 300 MG capsule    Sig: Take 1 capsule (300 mg total) by mouth 4 (four) times daily. Take three capsules by mouth three times a day for tingling/pain    Dispense:  360 capsule    Refill:  3  Order Specific Question:   Supervising Provider    Answer:   Tresa Garter [id number]  . hydrochlorothiazide (MICROZIDE) 12.5 MG capsule    Sig: Take 1 capsule (12.5 mg total) by mouth daily.    Dispense:  90 capsule    Refill:  3    Order Specific Question:   Supervising Provider    Answer:   Tresa Garter [id number]  . metFORMIN (GLUCOPHAGE) 500 MG tablet    Sig: Take 1 tablet (500 mg total) by mouth daily with breakfast.    Dispense:  90 tablet    Refill:  3    Order Specific Question:   Supervising Provider    Answer:   Tresa Garter [id number]  . metoprolol tartrate (LOPRESSOR) 50 MG tablet    Sig: Take 1 tablet (50 mg total) by mouth 2 (two) times daily. Take one two times a day for blood pressure.    Dispense:  180 tablet    Refill:  3    Order Specific Question:   Supervising Provider    Answer:   Tresa Garter [id number]  . traZODone (DESYREL) 100 MG tablet    Sig: Take 1 tablet (100 mg total) by mouth at bedtime. Take one table by mouth at bedtime as needed for sleep.    Dispense:  90 tablet    Refill:  3    Order Specific Question:   Supervising Provider    Answer:   Tresa Garter [id number]  . tamsulosin (FLOMAX) 0.4 MG CAPS capsule    Sig: Take 2 capsules (0.8 mg total) by mouth daily.    Dispense:  30 capsule    Refill:  3    Order Specific Question:   Supervising Provider    Answer:   Tresa Garter [id number]    Follow-up: Return in about 6 weeks (around 08/16/2021) for Follow up HTN 82500 and BPH.    [name], NP

## 2021-07-05 NOTE — Patient Instructions (Signed)
Health Maintenance, Male Adopting a healthy lifestyle and getting preventive care are important in promoting health and wellness. Ask your health care provider about: The right schedule for you to have regular tests and exams. Things you can do on your own to prevent diseases and keep yourself healthy. What should I know about diet, weight, and exercise? Eat a healthy diet  Eat a diet that includes plenty of vegetables, fruits, low-fat dairy products, and lean protein. Do not eat a lot of foods that are high in solid fats, added sugars, or sodium. Maintain a healthy weight Body mass index (BMI) is a measurement that can be used to identify possible weight problems. It estimates body fat based on height and weight. Your health care provider can help determine your BMI and help you achieve or maintain a healthy weight. Get regular exercise Get regular exercise. This is one of the most important things you can do for your health. Most adults should: Exercise for at least 150 minutes each week. The exercise should increase your heart rate and make you sweat (moderate-intensity exercise). Do strengthening exercises at least twice a week. This is in addition to the moderate-intensity exercise. Spend less time sitting. Even light physical activity can be beneficial. Watch cholesterol and blood lipids Have your blood tested for lipids and cholesterol at 64 years of age, then have this test every 5 years. You may need to have your cholesterol levels checked more often if: Your lipid or cholesterol levels are high. You are older than 64 years of age. You are at high risk for heart disease. What should I know about cancer screening? Many types of cancers can be detected early and may often be prevented. Depending on your health history and family history, you may need to have cancer screening at various ages. This may include screening for: Colorectal cancer. Prostate cancer. Skin cancer. Lung  cancer. What should I know about heart disease, diabetes, and high blood pressure? Blood pressure and heart disease High blood pressure causes heart disease and increases the risk of stroke. This is more likely to develop in people who have high blood pressure readings, are of African descent, or are overweight. Talk with your health care provider about your target blood pressure readings. Have your blood pressure checked: Every 3-5 years if you are 18-39 years of age. Every year if you are 40 years old or older. If you are between the ages of 65 and 75 and are a current or former smoker, ask your health care provider if you should have a one-time screening for abdominal aortic aneurysm (AAA). Diabetes Have regular diabetes screenings. This checks your fasting blood sugar level. Have the screening done: Once every three years after age 45 if you are at a normal weight and have a low risk for diabetes. More often and at a younger age if you are overweight or have a high risk for diabetes. What should I know about preventing infection? Hepatitis B If you have a higher risk for hepatitis B, you should be screened for this virus. Talk with your health care provider to find out if you are at risk for hepatitis B infection. Hepatitis C Blood testing is recommended for: Everyone born from 1945 through 1965. Anyone with known risk factors for hepatitis C. Sexually transmitted infections (STIs) You should be screened each year for STIs, including gonorrhea and chlamydia, if: You are sexually active and are younger than 64 years of age. You are older than 64 years   of age and your health care provider tells you that you are at risk for this type of infection. Your sexual activity has changed since you were last screened, and you are at increased risk for chlamydia or gonorrhea. Ask your health care provider if you are at risk. Ask your health care provider about whether you are at high risk for HIV.  Your health care provider may recommend a prescription medicine to help prevent HIV infection. If you choose to take medicine to prevent HIV, you should first get tested for HIV. You should then be tested every 3 months for as long as you are taking the medicine. Follow these instructions at home: Lifestyle Do not use any products that contain nicotine or tobacco, such as cigarettes, e-cigarettes, and chewing tobacco. If you need help quitting, ask your health care provider. Do not use street drugs. Do not share needles. Ask your health care provider for help if you need support or information about quitting drugs. Alcohol use Do not drink alcohol if your health care provider tells you not to drink. If you drink alcohol: Limit how much you have to 0-2 drinks a day. Be aware of how much alcohol is in your drink. In the [place] one drink equals one 12 oz bottle of beer (355 mL), one 5 oz glass of wine (148 mL), or one 1 oz glass of hard liquor (44 mL). General instructions Schedule regular health, dental, and eye exams. Stay current with your vaccines. Tell your health care provider if: You often feel depressed. You have ever been abused or do not feel safe at home. Summary Adopting a healthy lifestyle and getting preventive care are important in promoting health and wellness. Follow your health care provider's instructions about healthy diet, exercising, and getting tested or screened for diseases. Follow your health care provider's instructions on monitoring your cholesterol and blood pressure. This information is not intended to replace advice given to you by your health care provider. Make sure you discuss any questions you have with your health care provider. Document Revised: 12/22/2020 Document Reviewed: 10/07/2018 Elsevier Patient Education  2022 Elsevier Inc.  

## 2021-07-06 LAB — COMP. METABOLIC PANEL (12)
AST: 48 IU/L — ABNORMAL HIGH (ref 0–40)
Albumin/Globulin Ratio: 1.4 (ref 1.2–2.2)
Albumin: 4.6 g/dL (ref 3.8–4.8)
Alkaline Phosphatase: 93 IU/L (ref 44–121)
BUN/Creatinine Ratio: 12 (ref 10–24)
BUN: 10 mg/dL (ref 8–27)
Bilirubin Total: 0.6 mg/dL (ref 0.0–1.2)
Calcium: 9.9 mg/dL (ref 8.6–10.2)
Chloride: 98 mmol/L (ref 96–106)
Creatinine, Ser: 0.85 mg/dL (ref 0.76–1.27)
Globulin, Total: 3.4 g/dL (ref 1.5–4.5)
Glucose: 116 mg/dL — ABNORMAL HIGH (ref 65–99)
Potassium: 3.4 mmol/L — ABNORMAL LOW (ref 3.5–5.2)
Sodium: 136 mmol/L (ref 134–144)
Total Protein: 8 g/dL (ref 6.0–8.5)
eGFR: 97 mL/min/{1.73_m2} (ref >59)

## 2021-07-06 LAB — LIPID PANEL
Chol/HDL Ratio: 2.3 ratio (ref 0.0–5.0)
Cholesterol, Total: 113 mg/dL (ref 100–199)
HDL: 49 mg/dL (ref >39)
LDL Chol Calc (NIH): 34 mg/dL (ref 0–99)
Triglycerides: 190 mg/dL — ABNORMAL HIGH (ref 0–149)
VLDL Cholesterol Cal: 30 mg/dL (ref 5–40)

## 2021-07-07 ENCOUNTER — Encounter: Payer: Self-pay | Admitting: Nurse Practitioner

## 2021-07-08 LAB — URINE CULTURE

## 2021-07-12 ENCOUNTER — Other Ambulatory Visit: Payer: Self-pay

## 2021-07-12 NOTE — Patient Outreach (Signed)
Care Coordination  07/12/2021  [name] May 30, 1956 [id number]  07/12/2021 Name: [name] MRN: [id number] DOB: Aug 10, 1956  Referred by: [name], NP Reason for referral : High Risk Managed Medicaid (Unsuccessful Telephone Outreach. I attempted 2 unscuccessful telephone calls this morning.  I was unable to leave a voicemail message due to VM mailbox is full.)   An unsuccessful telephone outreach was attempted today. The patient was referred to the case management team for assistance with care management and care coordination.  I attempted 2 unscuccessful telephone calls this morning.  I was unable to leave a voicemail message due to VM mailbox is full.  Follow Up Plan: The Managed Medicaid care management team will reach out to the patient again over the next 7 days.    [name], BSN Community Care Coordinator [facility] Mobile: [phone]

## 2021-07-13 ENCOUNTER — Telehealth: Payer: Self-pay | Admitting: Nurse Practitioner

## 2021-07-13 NOTE — Telephone Encounter (Signed)
..   Medicaid Managed Care   Unsuccessful Outreach Note  07/13/2021 Name: [name] MRN: [id number] DOB: 10/29/1955  Referred by: [name], NP Reason for referral : High Risk Managed Medicaid (I called this patient today to get him rescheduled for a phone visit with the MM RNCM. His VM was full.)   An unsuccessful telephone outreach was attempted today. The patient was referred to the case management team for assistance with care management and care coordination.   Follow Up Plan: The care management team will reach out to the patient again over the next 7 days.   [place]

## 2021-07-30 ENCOUNTER — Ambulatory Visit: Payer: Self-pay

## 2021-07-31 ENCOUNTER — Telehealth: Payer: Self-pay | Admitting: Nurse Practitioner

## 2021-07-31 NOTE — Telephone Encounter (Signed)
..   Medicaid Managed Care   Unsuccessful Outreach Note  07/31/2021 Name: [name] MRN: [id number] DOB: January 03, 1956  Referred by: [name], NP Reason for referral : High Risk Managed Medicaid (I called the patient today to get him rescheduled with the [facility]. I was not able to reach him or leave a message.)   A second unsuccessful telephone outreach was attempted today. The patient was referred to the case management team for assistance with care management and care coordination.   Follow Up Plan: The care management team will reach out to the patient again over the next 7-14 days.   [place]

## 2021-08-14 ENCOUNTER — Other Ambulatory Visit: Payer: Self-pay

## 2021-08-14 NOTE — Patient Outreach (Signed)
Medicaid Managed Care   Nurse Care Manager Note  08/14/2021 Name:  [name] MRN:  [id number] DOB:  1956/03/25  [name] is an 65 y.o. year old male who is a primary patient of Brian Francois, NP.  [facility] Managed Care Coordination team was consulted for assistance with:    HTN HLD DMII  [name] was given information about Medicaid Managed Care Coordination team services today. [name] Patient agreed to services and verbal consent obtained.  Engaged with patient by telephone for follow up visit in response to provider referral for case management and/or care coordination services.   Assessments/Interventions:  Review of past medical history, allergies, medications, health status, including review of consultants reports, laboratory and other test data, was performed as part of comprehensive evaluation and provision of chronic care management services.  SDOH (Social Determinants of Health) assessments and interventions performed:   Care Plan  Allergies  Allergen Reactions   Lisinopril Other (See Comments)    angioedema   Citalopram Nausea Only   Morphine Nausea And Vomiting   Nsaids Other (See Comments)    Stomach Irritability    Paroxetine     Other reaction(s): NAUSEA,VOMITING    Medications Reviewed Today     Reviewed by Brian Rise, RN (Case Manager) on 08/14/21 at 0925  Med List Status: <None>   Medication Order Taking? Sig Documenting Provider Last Dose Status Informant  amLODipine (NORVASC) 5 MG tablet [id number]  Take 1 tablet (5 mg total) by mouth daily. Brian Francois, NP  Active   atorvastatin (LIPITOR) 40 MG tablet [id number]  Take 1 tablet (40 mg total) by mouth daily. Brian Francois, NP  Active   blood glucose meter kit and supplies KIT [id number]  Dispense based on patient and insurance preference. Use up to four times daily as directed. Brian Francois, NP  Active   clopidogrel (PLAVIX) 75 MG tablet [id number]  Take 1 tablet  (75 mg total) by mouth daily. Brian Francois, NP  Active   famotidine (PEPCID) 20 MG tablet [id number]  Take 1 tablet (20 mg total) by mouth 2 (two) times daily. Take one tablet by mouth two times a day Brian Francois, NP  Active   folic acid (FOLVITE) 1 MG tablet [id number]  Take 1 tablet (1 mg total) by mouth daily. [name], MD  Active            Med Note [facility], [facility] A   Thu Jun 14, 2021 11:03 AM) Patient does not have  gabapentin (NEURONTIN) 300 MG capsule [id number]  Take 1 capsule (300 mg total) by mouth 4 (four) times daily. Take three capsules by mouth three times a day for tingling/pain Brian Francois, NP  Active   hydrochlorothiazide (MICROZIDE) 12.5 MG capsule [id number]  Take 1 capsule (12.5 mg total) by mouth daily. Brian Francois, NP  Active   metFORMIN (GLUCOPHAGE) 500 MG tablet [id number]  Take 1 tablet (500 mg total) by mouth daily with breakfast. Brian Francois, NP  Active   metoprolol tartrate (LOPRESSOR) 50 MG tablet [id number]  Take 1 tablet (50 mg total) by mouth 2 (two) times daily. Take one two times a day for blood pressure. Brian Francois, NP  Active   Multiple Vitamin (MULTIVITAMIN WITH MINERALS) TABS tablet [id number]  Take 1 tablet by mouth daily. [name], MD  Active            Med Note [facility],  Corianne [facility]ccell[facility] A   Thu Jun 14, 2021 11:03 AM) Patient does not have  tamsulosin (FLOMAX) 0.4 MG CAPS capsule [id number]  Take 2 capsules (0.8 mg total) by mouth daily. Brian Francois, NP  Active   thiamine 100 MG tablet [id number]  Take 1 tablet (100 mg total) by mouth daily. [name], MD  Active            Med Note [facility], [facility] A   Thu Jun 14, 2021 11:03 AM) Patient does not have  traZODone (DESYREL) 100 MG tablet [id number]  Take 1 tablet (100 mg total) by mouth at bedtime. Take one table by mouth at bedtime as needed for sleep. Brian Francois, NP  Active             Patient Active Problem List   Diagnosis Date Noted   Alcoholic  intoxication without complication ([place]) 67/34/1937   Marijuana use 05/16/2021   CVA (cerebral vascular accident) ([place]) 05/14/2021   Alcohol abuse 05/14/2021   Tobacco abuse 05/14/2021   DM2 (diabetes mellitus, type 2) ([place]) 05/14/2021   Angioedema 02/09/2017   Primary localized osteoarthritis of right hip 01/02/2016   Groin rash 90/24/0973   DM w/o complication type II ([place]) 06/16/2013   BPH (benign prostatic hyperplasia) 11/24/2012   Healthcare maintenance 11/24/2012   Pancreatitis 10/31/2012   Chronic pain syndrome 09/18/2011   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/19/2011   Right rotator cuff tear 07/26/2011   Shoulder pain, right 05/03/2011   Right ankle pain 01/23/2011   Left wrist pain 01/23/2011   Back pain 01/02/2011   MOLLUSCUM CONTAGIOSUM 12/05/2008   LEUKOCYTOSIS UNSPECIFIED 03/25/2008   LIVER FUNCTION TESTS, ABNORMAL 03/25/2008   ERECTILE DYSFUNCTION 05/12/2007   Essential hypertension 03/02/2007   Hypercholesteremia 01/08/2007   ANXIETY 01/08/2007   GERD 01/08/2007   Rheumatoid arthritis ([place]) 01/08/2007   OSTEOARTHRITIS 01/08/2007    Conditions to be addressed/monitored per PCP order:  HTN, HLD, and DMII  Care Plan : RN Care Manger Plan of Care  Updates made by Brian Rise, RN since 08/14/2021 12:00 AM     Problem: Chronic Disease Management and Care Coordination Needs for CVA, HTN, HLD and DM II.      Long-Range Goal: Development of Plan of Care for Chronic Disease Management and Care Coordination Needs   Start Date: 06/14/2021  Expected End Date: 11/12/2021  Priority: High  Note:   Current Barriers:  Knowledge Deficits related to plan of care for management of HTN, HLD, DMII, and CVA  Care Coordination needs related to Medication procurement and Memory Deficits  Chronic Disease Management support and education needs related to HTN, HLD, DMII, and CVA Non-adherence to prescribed medication regimen  RNCM Clinical Goal(s):  Patient will verbalize understanding of  plan for management of HTN, HLD, DMII, and CVA verbalize basic understanding of HTN, HLD, DMII, and CVA disease process and self health management plan   take all medications exactly as prescribed and will call provider for medication related questions attend all scheduled medical appointments:  Neurologist appointment on 08/15/2021 at 9:00 am, PCP office appointment on 08/17/21, [facility] Pharmacist on 08/17/21 continue to work with RN Care Manager to address care management and care coordination needs related to HTN, HLD, DMII, and CVA  work with pharmacist to address Medication procurement related to HTN, HLD, DMII, and CVA  through collaboration with Consulting civil engineer, provider, and care team.   Interventions: Inter-disciplinary care team collaboration (see longitudinal plan of care) Evaluation of current treatment plan  related to  self management and patient's adherence to plan as established by provider   Diabetes:  (Status: New goal.) Lab Results  Component Value Date   HGBA1C 5.9 (H) 05/15/2021  Assessed patient's understanding of A1c goal: <7% Provided education to patient about basic DM disease process; Reviewed medications with patient and discussed importance of medication adherence;        Counseled on importance of regular laboratory monitoring as prescribed;        Discussed plans with patient for ongoing care management follow up and provided patient with direct contact information for care management team;      Reviewed scheduled/upcoming provider appointments including: PCP appointment on 08/17/21, Neurologist on 08/15/2021;         Advised patient, providing education and rationale, to check cbg 3 times a day once he obtains a blood glucose monitor and record.  Patient's blood sugars at home ranging from 95 - 110.            Assessed social determinant of health barriers;         Hyperlipidemia:  (Status: New goal.) Lab Results  Component Value Date   CHOL 113 07/05/2021   HDL  49 07/05/2021   LDLCALC 34 07/05/2021   LDLDIRECT 96.5 05/14/2021   TRIG 190 (H) 07/05/2021   CHOLHDL 2.3 07/05/2021    Lab Results  Component Value Date   CHOL 113 07/05/2021   CHOL 175 05/15/2021   CHOL 163 02/12/2017   Lab Results  Component Value Date   HDL 49 07/05/2021   HDL 47 05/15/2021   HDL 64 02/12/2017   Lab Results  Component Value Date   LDLCALC 34 07/05/2021   LDLCALC 76 05/15/2021   LDLCALC 70 02/12/2017   Lab Results  Component Value Date   TRIG 190 (H) 07/05/2021   TRIG 258 (H) 05/15/2021   TRIG 144 02/12/2017   Lab Results  Component Value Date   CHOLHDL 2.3 07/05/2021   CHOLHDL 3.7 05/15/2021   CHOLHDL 2.5 02/12/2017   Lab Results  Component Value Date   LDLDIRECT 96.5 05/14/2021     Medication review performed; medication list updated in electronic medical record.  Provider established cholesterol goals reviewed; Counseled on importance of regular laboratory monitoring as prescribed; Provided HLD educational materials; Reviewed role and benefits of statin for ASCVD risk reduction; Reviewed importance of limiting foods high in cholesterol; Assessed social determinant of health barriers;   Hypertension: (Status: New goal.) Last practice recorded BP readings:  BP Readings from Last 3 Encounters:  07/05/21 (!) 164/80  05/16/21 (!) 163/87  09/28/20 139/78    Most recent eGFR/CrCl: No results found for: EGFR  No components found for: CRCL  Provided education to patient about basic DM disease process; Reviewed medications with patient and discussed importance of medication adherence;        Counseled on importance of regular laboratory monitoring as prescribed;        Discussed plans with patient for ongoing care management follow up and provided patient with direct contact information for care management team;      Reviewed scheduled/upcoming provider appointments including: PCP appointment on 07/05/2021 and Neurologist on 08/15/2021;               Assessed social determinant of health barriers;         Patient Goals/Self-Care Activities: Patient will self administer medications as prescribed Patient will attend all scheduled provider appointments Patient will call pharmacy for medication refills  Follow Up:  Patient agrees to Care Plan and Follow-up.  Plan: The Managed Medicaid care management team will reach out to the patient again over the next 30 days.  Date/time of next scheduled RN care management/care coordination outreach:  09/11/2021  [name], BSN [facility] Coordinator [place] [facility] Mobile: [phone]

## 2021-08-14 NOTE — Patient Instructions (Signed)
Visit Information  [name] was given information about Medicaid Managed Care team care coordination services as a part of their [place] Medicaid benefit. [name] verbally consented to engagement with the [facility] Managed Care team.   If you are experiencing a medical emergency, please call 911 or report to your local emergency department or urgent care.   If you have a non-emergency medical problem during routine business hours, please contact your provider's office and ask to speak with a nurse.   For questions related to your [facility], please call: [phone] or visit the homepage here: [email or web address]  If you would like to schedule transportation through your [facility], please call the following number at least 2 days in advance of your appointment: [phone].   Call the [place] at [phone], at any time, 24 hours a day, 7 days a week. If you are in danger or need immediate medical attention call 911.  If you would like help to quit smoking, call 1-800-QUIT-NOW 406-416-0250) OR Espaol: 1-855-Djelo-Ya (3-664-403-4742) o para ms informacin haga clic aqu or Text READY to 200-400 to register via text  Mr. Fahr - following are the goals we discussed in your visit today:   Goals Addressed             This Visit's Progress    Blood Pressure < 140/90       Timeframe:  Long-Range Goal Priority:  High Start Date: 06/13/2021                       Expected End Date:   11/14/2021    Patient will self administer medications as prescribed Patient will attend all scheduled provider appointments Patient will call pharmacy for medication refills   Patient will check blood pressure at home and record.       HLD Management       Timeframe:  Long-Range Goal Priority:  High Start Date: 06/14/2021       Expected End  Date:  11/14/2021                  Patient will self administer medications as prescribed Patient will attend all scheduled provider appointments Patient will call pharmacy for medication refills                     RNCM Blood sugar monitoring and management       Timeframe:  Long-Range Goal Priority:  High Start Date:  06/14/2021                           Expected End Date:  11/14/2021          Patient will self administer medications as prescribed Patient will attend all scheduled provider appointments Patient will call pharmacy for medication refills              Please see education materials related to today's visit provided by MyChart link.  The patient verbalized understanding of instructions provided today and declined a print copy of patient instruction materials.   The Managed Medicaid care management team will reach out to the patient again over the next 30 days.   [name], BSN Community Care Coordinator [place] [facility] Mobile: [phone]

## 2021-08-15 ENCOUNTER — Encounter: Payer: Self-pay | Admitting: Diagnostic Neuroimaging

## 2021-08-15 ENCOUNTER — Ambulatory Visit: Payer: Medicaid Other | Admitting: Diagnostic Neuroimaging

## 2021-08-15 VITALS — BP 136/82 | HR 71 | Ht 67.0 in | Wt 151.2 lb

## 2021-08-15 DIAGNOSIS — I6302 Cerebral infarction due to thrombosis of basilar artery: Secondary | ICD-10-CM

## 2021-08-15 NOTE — Patient Instructions (Signed)
Continue atorvastatin 40mg  daily Continue plavix 75mg  daily Continue BP control

## 2021-08-15 NOTE — Progress Notes (Signed)
[facility]  PATIENT: [name] DOB: 1956/07/10  REFERRING CLINICIAN: [name], MD HISTORY FROM: patient  REASON FOR VISIT: new consult    HISTORICAL  CHIEF COMPLAINT:  Chief Complaint  Patient presents with   New Patient (Initial Visit)    Rm 7 alone here for consult on Stroke- Pt reports since d/c in July he has been doing ok. Some trouble sleeping at night has been noted.    HISTORY OF PRESENT ILLNESS:   65 year old male with hypertension, diabetes, hepatitis, hyperlipidemia, here for evaluation of stroke follow-up.  Patient presented to hospital in July 2022 with right-sided weakness and slurred speech.  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  Patient was treated with aspirin Plavix for 21 days and then Plavix alone.  He was started on atorvastatin.  Smoking cessation and alcohol abstinence were reviewed with patient.  Since that time patient is doing well at home.   REVIEW OF SYSTEMS: Full 14 system review of systems performed and negative with exception of: as per HPI.  ALLERGIES: Allergies  Allergen Reactions   Lisinopril Other (See Comments)    angioedema   Citalopram Nausea Only   Morphine Nausea And Vomiting   Nsaids Other (See Comments)    Stomach Irritability    Paroxetine     Other reaction(s): NAUSEA,VOMITING    HOME MEDICATIONS: Outpatient Medications Prior to Visit  Medication Sig Dispense Refill   amLODipine (NORVASC) 5 MG tablet Take 1 tablet (5 mg total) by mouth daily. 90 tablet 3   atorvastatin (LIPITOR) 40 MG tablet Take 1 tablet (40 mg total) by mouth daily. 90 tablet 3   blood glucose meter kit and supplies KIT Dispense based on patient and insurance preference. Use up to four times daily as directed. 1 each 0   clopidogrel (PLAVIX) 75 MG tablet Take 1 tablet (75 mg total) by mouth daily. 90 tablet 3   famotidine (PEPCID) 20 MG tablet Take 1 tablet (20 mg  total) by mouth 2 (two) times daily. Take one tablet by mouth two times a day 998 tablet 3   folic acid (FOLVITE) 1 MG tablet Take 1 tablet (1 mg total) by mouth daily.     gabapentin (NEURONTIN) 300 MG capsule Take 1 capsule (300 mg total) by mouth 4 (four) times daily. Take three capsules by mouth three times a day for tingling/pain 360 capsule 3   hydrochlorothiazide (MICROZIDE) 12.5 MG capsule Take 1 capsule (12.5 mg total) by mouth daily. 90 capsule 3   metFORMIN (GLUCOPHAGE) 500 MG tablet Take 1 tablet (500 mg total) by mouth daily with breakfast. 90 tablet 3   Multiple Vitamin (MULTIVITAMIN WITH MINERALS) TABS tablet Take 1 tablet by mouth daily. (Patient not taking: Reported on 08/17/2021)     traZODone (DESYREL) 100 MG tablet Take 1 tablet (100 mg total) by mouth at bedtime. Take one table by mouth at bedtime as needed for sleep. 90 tablet 3   metoprolol tartrate (LOPRESSOR) 50 MG tablet Take 1 tablet (50 mg total) by mouth 2 (two) times daily. Take one two times a day for blood pressure. 180 tablet 3   tamsulosin (FLOMAX) 0.4 MG CAPS capsule Take 2 capsules (0.8 mg total) by mouth daily. (Patient not taking: Reported on 08/17/2021) 30 capsule 3   thiamine 100 MG tablet Take 1 tablet (100 mg total) by mouth daily. (Patient not taking: Reported on 08/17/2021)     No facility-administered medications  prior to visit.    PAST MEDICAL HISTORY: Past Medical History:  Diagnosis Date   Alcohol abuse    Allergic rhinitis    Anxiety    With Post traumatic stress disorder   Diabetes mellitus without complication ([place])    'sometime last yr'--type 2   GERD (gastroesophageal reflux disease)    Headache    "like migraines"   Hepatitis    he thinks its hep b or c   Hyperlipidemia    Hypertension    Osteoarthritis    Pancreatitis    Rheumatoid arthritis(714.0)    Tobacco user     PAST SURGICAL HISTORY: Past Surgical History:  Procedure Laterality Date   COLONOSCOPY     FRACTURE  SURGERY     cheek bone fracture    rtc     right shoulder  -- 2010   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   Procedure: TOTAL HIP ARTHROPLASTY ANTERIOR APPROACH;  Surgeon: Renette Butters, MD;  Location: [facility];  Service: Orthopedics;  Laterality: Right;   WRIST SURGERY     fusion with pins  2007    FAMILY HISTORY: Family History  Problem Relation Age of Onset   Kidney disease Brother    Angioedema Maternal Aunt        Tongue swelling   Heart attack Mother    Heart attack Father     SOCIAL HISTORY: Social History   Socioeconomic History   Marital status: Single    Spouse name: Not on file   Number of children: Not on file   Years of education: Not on file   Highest education level: Not on file  Occupational History   Not on file  Tobacco Use   Smoking status: Every Day    Packs/day: 0.40    Years: 25.00    Pack years: 10.00    Types: Cigarettes    Passive exposure: Current   Smokeless tobacco: Never   Tobacco comments:    Trying to quit smoking.  Currently smoking 1 -2 cigarette per day and is doing "OK" with decrease.  Vaping Use   Vaping Use: Never used  Substance and Sexual Activity   Alcohol use: Yes    Alcohol/week: 7.0 - 14.0 standard drinks    Types: 7 - 14 Cans of beer per week    Comment: 1-2 beers occasionally   Drug use: No   Sexual activity: Yes  Other Topics Concern   Not on file  Social History Narrative   Sharyon Cable. Married- 70 male sexual partner.   Denies drug use but has hx of crack cocaine, alcohol and tobacco use.   Social Determinants of Health   Financial Resource Strain: Low Risk    Difficulty of Paying Living Expenses: Not very hard  Food Insecurity: Food Insecurity Present   Worried About [place] in the Last Year: Sometimes true   Ran Out of Food in the Last Year: Sometimes true  Transportation Needs: No Transportation Needs   Lack of Transportation (Medical): No   Lack of Transportation (Non-Medical): No  Physical  Activity: Not on file  Stress: No Stress Concern Present   Feeling of Stress : Only a little  Social Connections: Unknown   Frequency of Communication with Friends and Family: More than three times a week   Frequency of Social Gatherings with Friends and Family: More than three times a week   Attends Religious Services: Not on Electrical engineer or Organizations:  Not on file   Attends Club or Organization Meetings: Not on file   Marital Status: Not on file  Intimate Partner Violence: Not on file     PHYSICAL EXAM  GENERAL EXAM/CONSTITUTIONAL: Vitals:  Vitals:   08/15/21 0905  BP: 136/82  Pulse: 71  SpO2: 98%  Weight: 151 lb 4 oz (68.6 kg)  Height: '5\' 7"'  (1.702 m)   Body mass index is 23.69 kg/m. Wt Readings from Last 3 Encounters:  08/17/21 155 lb 0.2 oz (70.3 kg)  08/15/21 151 lb 4 oz (68.6 kg)  07/05/21 149 lb 0.2 oz (67.6 kg)   Patient is in no distress; well developed, nourished and groomed; neck is supple  CARDIOVASCULAR: Examination of carotid arteries is normal; no carotid bruits Regular rate and rhythm, no murmurs Examination of peripheral vascular system by observation and palpation is normal  EYES: Ophthalmoscopic exam of optic discs and posterior segments is normal; no papilledema or hemorrhages No results found.  MUSCULOSKELETAL: Gait, strength, tone, movements noted in Neurologic exam below  NEUROLOGIC: MENTAL STATUS:  No flowsheet data found. awake, alert, oriented to person, place and time recent and remote memory intact normal attention and concentration language fluent, comprehension intact, naming intact fund of knowledge appropriate  CRANIAL NERVE:  2nd - no papilledema on fundoscopic exam 2nd, 3rd, 4th, 6th - pupils equal and reactive to light, visual fields full to confrontation, extraocular muscles intact, no nystagmus 5th - facial sensation symmetric 7th - facial strength symmetric 8th - hearing intact 9th - palate  elevates symmetrically, uvula midline 11th - shoulder shrug symmetric 12th - tongue protrusion midline  MOTOR:  normal bulk and tone, full strength in the BUE, BLE; EXCEPT LIMITED IN BILATERAL DELTOIDS (LEFT WORSE THAN RIGHT; DUE TO PAIN); MILD ATROPHY IN RIGHT HAND INTRINSIC MUSCLES  SENSORY:  normal and symmetric to light touch, temperature, vibration  COORDINATION:  finger-nose-finger, fine finger movements normal  REFLEXES:  deep tendon reflexes 1+  and symmetric  GAIT/STATION:  narrow based gait     DIAGNOSTIC DATA (LABS, IMAGING, TESTING) - I reviewed patient records, labs, notes, testing and imaging myself where available.  Lab Results  Component Value Date   WBC 10.1 05/15/2021   HGB 16.7 05/15/2021   HCT 47.2 05/15/2021   MCV 94.6 05/15/2021   PLT 211 05/15/2021      Component Value Date/Time   NA 136 07/05/2021 1133   K 3.4 (L) 07/05/2021 1133   CL 98 07/05/2021 1133   CO2 25 05/15/2021 0902   GLUCOSE 116 (H) 07/05/2021 1133   GLUCOSE 143 (H) 05/15/2021 0902   BUN 10 07/05/2021 1133   CREATININE 0.85 07/05/2021 1133   CREATININE 0.81 02/12/2017 1003   CALCIUM 9.9 07/05/2021 1133   PROT 8.0 07/05/2021 1133   ALBUMIN 4.6 07/05/2021 1133   AST 48 (H) 07/05/2021 1133   ALT 24 05/15/2021 0902   ALKPHOS 93 07/05/2021 1133   BILITOT 0.6 07/05/2021 1133   GFRNONAA >60 05/15/2021 0902   GFRNONAA >89 02/12/2017 1003   GFRAA 94 09/28/2020 1511   GFRAA >89 02/12/2017 1003   Lab Results  Component Value Date   CHOL 113 07/05/2021   HDL 49 07/05/2021   LDLCALC 34 07/05/2021   LDLDIRECT 96.5 05/14/2021   TRIG 190 (H) 07/05/2021   CHOLHDL 2.3 07/05/2021   Lab Results  Component Value Date   HGBA1C 5.7 (A) 08/17/2021   No results found for: VITAMINB12 No results found for: TSH   MRI brain [I  reviewed images myself and agree with interpretation. -VRP]  1. Acute/subacute 8.5 mm nonhemorrhagic infarct in the left pons. 2. Remote nonhemorrhagic lacunar  infarct of the right thalamus. 3. Remote hemorrhagic infarct in the posterior right cerebellum. 4. Atrophy and white matter disease is moderately advanced for age. This likely reflects the sequela of chronic microvascular ischemia. 5. Degenerative changes in the cervical spine with central canal stenosis at C3-4.    ASSESSMENT AND PLAN  65 y.o. year old male here with:   Dx:  1. Cerebrovascular accident (CVA) due to thrombosis of basilar artery (HCC)      PLAN:  Subacute Left pons infarct secondary to small vessel disease Remote lacunar infarcts Code Stroke CT head No acute stroke. Small vessel disease. ASPECTS 10.    CTA head & neck: no intracranial arterial occlusion MRI: 8.47m infarct in the left pons. Remote lacunar infarct of the right thalmus. Remote hemorrhagic infarct in the posterior right cerebellum.  2D Echo - No intracardiac source of embolism  detected on this transthoracic study.  LDL 76 --> continue atorvastatin 485mdaily HgbA1c 5.9  Continue plavix 7552maily Continue BP control Trying to cut down on tobacco and etoh  Return for return to PCP.    VIKPenni BombardD 10/75/79/72820:06:01 Certified in Neurology, Neurophysiology and Neuroimaging  [facility] [address]uiTiptoneJacob CityC [id number]

## 2021-08-17 ENCOUNTER — Ambulatory Visit (INDEPENDENT_AMBULATORY_CARE_PROVIDER_SITE_OTHER): Payer: Medicaid Other | Admitting: Nurse Practitioner

## 2021-08-17 ENCOUNTER — Encounter: Payer: Self-pay | Admitting: Nurse Practitioner

## 2021-08-17 ENCOUNTER — Other Ambulatory Visit: Payer: Self-pay

## 2021-08-17 VITALS — BP 148/82 | HR 61 | Temp 97.8°F | Ht 67.0 in | Wt 155.0 lb

## 2021-08-17 DIAGNOSIS — M25512 Pain in left shoulder: Secondary | ICD-10-CM

## 2021-08-17 DIAGNOSIS — E118 Type 2 diabetes mellitus with unspecified complications: Secondary | ICD-10-CM

## 2021-08-17 DIAGNOSIS — M25511 Pain in right shoulder: Secondary | ICD-10-CM | POA: Diagnosis not present

## 2021-08-17 DIAGNOSIS — I10 Essential (primary) hypertension: Secondary | ICD-10-CM | POA: Diagnosis not present

## 2021-08-17 LAB — POCT GLYCOSYLATED HEMOGLOBIN (HGB A1C): Hemoglobin A1C: 5.7 % — AB (ref 4.0–5.6)

## 2021-08-17 LAB — GLUCOSE, POCT (MANUAL RESULT ENTRY): POC Glucose: 147 mg/dl — AB (ref 70–99)

## 2021-08-17 NOTE — Progress Notes (Signed)
[place], [place]  [address] Phone:  [phone]   Fax:  [phone]   Established Patient Office Visit  Subjective:  Patient ID: [name], male    DOB: [birth year]  Age: 65 y.o. MRN: [id number]  CC:  Chief Complaint  Patient presents with   Follow-up    6 week follow up;    Shoulder Pain    HPI [name] presents for follow up. He  has a past medical history of Alcohol abuse, Allergic rhinitis, Anxiety, Diabetes mellitus without complication ([place]), GERD (gastroesophageal reflux disease), Headache, Hepatitis, Hyperlipidemia, Hypertension, Osteoarthritis, Pancreatitis, Rheumatoid arthritis(714.0), and Tobacco user.    Shoulder Pain Patient complains of left shoulder pain. The has a history of right shoulder pain with surgical intervetion. Aggravating factors: no known event. Pain is located between the neck and shoulder and around the acromioclavicular [facility]) joint. Discomfort is described as aching and throbbing. Symptoms are exacerbated by lying on the shoulder. Evaluation to date: none. Therapy to date includes: avoidance of offending activity and OTC analgesics which are ineffective. He is on Gabapentin which has not been effective for this pai.   He is following up with his HTN and DM. He is trying to cut down on the cigarette smoking.  He reports that his BP 148/78. He is complaint with his medication. He did not take his medication this morning   Past Medical History:  Diagnosis Date   Alcohol abuse    Allergic rhinitis    Anxiety    With Post traumatic stress disorder   Diabetes mellitus without complication ([place])    'sometime last yr'--type 2   GERD (gastroesophageal reflux disease)    Headache    "like migraines"   Hepatitis    he thinks its hep b or c   Hyperlipidemia    Hypertension    Osteoarthritis    Pancreatitis    Rheumatoid arthritis(714.0)    Tobacco user     Past Surgical History:  Procedure  Laterality Date   COLONOSCOPY     FRACTURE SURGERY     cheek bone fracture    rtc     right shoulder  -- 2010   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   Procedure: TOTAL HIP ARTHROPLASTY ANTERIOR APPROACH;  Surgeon: Renette Butters, MD;  Location: [facility];  Service: Orthopedics;  Laterality: Right;   WRIST SURGERY     fusion with pins  2007    Family History  Problem Relation Age of Onset   Kidney disease Brother    Angioedema Maternal Aunt        Tongue swelling   Heart attack Mother    Heart attack Father     Social History   Socioeconomic History   Marital status: Single    Spouse name: Not on file   Number of children: Not on file   Years of education: Not on file   Highest education level: Not on file  Occupational History   Not on file  Tobacco Use   Smoking status: Every Day    Packs/day: 0.40    Years: 25.00    Pack years: 10.00    Types: Cigarettes    Passive exposure: Current   Smokeless tobacco: Never   Tobacco comments:    Trying to quit smoking.  Currently smoking 1 -2 cigarette per day and is doing "OK" with decrease.  Vaping Use   Vaping Use: Never used  Substance  and Sexual Activity   Alcohol use: Yes    Alcohol/week: 7.0 - 14.0 standard drinks    Types: 7 - 14 Cans of beer per week    Comment: 1-2 beers occasionally   Drug use: No   Sexual activity: Yes  Other Topics Concern   Not on file  Social History Narrative   Sharyon Cable. Married- 65 male sexual partner.   Denies drug use but has hx of crack cocaine, alcohol and tobacco use.   Social Determinants of Health   Financial Resource Strain: Low Risk    Difficulty of Paying Living Expenses: Not very hard  Food Insecurity: Food Insecurity Present   Worried About [place] in the Last Year: Sometimes true   Ran Out of Food in the Last Year: Sometimes true  Transportation Needs: No Transportation Needs   Lack of Transportation (Medical): No   Lack of Transportation (Non-Medical): No   Physical Activity: Not on file  Stress: No Stress Concern Present   Feeling of Stress : Only a little  Social Connections: Unknown   Frequency of Communication with Friends and Family: More than three times a week   Frequency of Social Gatherings with Friends and Family: More than three times a week   Attends Religious Services: Not on Electrical engineer or Organizations: Not on file   Attends Archivist Meetings: Not on file   Marital Status: Not on file  Intimate Partner Violence: Not on file    Outpatient Medications Prior to Visit  Medication Sig Dispense Refill   amLODipine (NORVASC) 5 MG tablet Take 1 tablet (5 mg total) by mouth daily. 90 tablet 3   atorvastatin (LIPITOR) 40 MG tablet Take 1 tablet (40 mg total) by mouth daily. 90 tablet 3   blood glucose meter kit and supplies KIT Dispense based on patient and insurance preference. Use up to four times daily as directed. 1 each 0   capsaicin (ZOSTRIX) 0.025 % cream APPLY SMALL AMOUNT TOPICALLY THREE TIMES A DAY AVOID GETTING ON FACE, IN EYES OR ON SENSITIVE AREAS. [place] HANDS THOROUGHLY AFTER USING.     clopidogrel (PLAVIX) 75 MG tablet Take 1 tablet (75 mg total) by mouth daily. 90 tablet 3   famotidine (PEPCID) 20 MG tablet Take 1 tablet (20 mg total) by mouth 2 (two) times daily. Take one tablet by mouth two times a day 631 tablet 3   folic acid (FOLVITE) 1 MG tablet Take 1 tablet (1 mg total) by mouth daily.     gabapentin (NEURONTIN) 300 MG capsule Take 1 capsule (300 mg total) by mouth 4 (four) times daily. Take three capsules by mouth three times a day for tingling/pain 360 capsule 3   hydrochlorothiazide (MICROZIDE) 12.5 MG capsule Take 1 capsule (12.5 mg total) by mouth daily. 90 capsule 3   metFORMIN (GLUCOPHAGE) 500 MG tablet Take 1 tablet (500 mg total) by mouth daily with breakfast. 90 tablet 3   metoprolol tartrate (LOPRESSOR) 50 MG tablet Take 1 tablet (50 mg total) by mouth 2 (two) times daily.  Take one two times a day for blood pressure. 180 tablet 3   Multiple Vitamin (MULTIVITAMIN WITH MINERALS) TABS tablet Take 1 tablet by mouth daily.     nicotine polacrilex (NICORETTE) 4 MG gum CHEW 1 PIECE OF GUM BY MOUTH AS DIRECTED USE AT LEAST 8-10 PIECES EACH DAY TO START. DO NOT USE MORE THAN 24 PIECES IN A DAY. DO NOT EAT OR  DRINK FOR 15 MINUTES BEFORE AND DURING USE     tamsulosin (FLOMAX) 0.4 MG CAPS capsule Take 2 capsules (0.8 mg total) by mouth daily. 30 capsule 3   thiamine 100 MG tablet Take 1 tablet (100 mg total) by mouth daily.     traZODone (DESYREL) 100 MG tablet Take 1 tablet (100 mg total) by mouth at bedtime. Take one table by mouth at bedtime as needed for sleep. 90 tablet 3   No facility-administered medications prior to visit.    Allergies  Allergen Reactions   Lisinopril Other (See Comments)    angioedema   Citalopram Nausea Only   Morphine Nausea And Vomiting   Nsaids Other (See Comments)    Stomach Irritability    Paroxetine     Other reaction(s): NAUSEA,VOMITING    ROS Review of Systems  Neurological:  Positive for headaches (left back soreness sharp pain that comes around).     Objective:    Physical Exam HENT:     Head: Normocephalic and atraumatic.     Nose: Nose normal.     Mouth/Throat:     Mouth: Mucous membranes are moist.  Cardiovascular:     Rate and Rhythm: Normal rate and regular rhythm.     Pulses: Normal pulses.     Heart sounds: Normal heart sounds.  Pulmonary:     Effort: Pulmonary effort is normal.     Comments: diminshed Abdominal:     Palpations: Abdomen is soft.  Musculoskeletal:        General: Normal range of motion.     Cervical back: Normal range of motion.     Right lower leg: No edema.     Left lower leg: No edema.  Skin:    General: Skin is warm.     Capillary Refill: Capillary refill takes less than 2 seconds.  Neurological:     General: No focal deficit present.     Mental Status: He is alert and oriented to  person, place, and time.  Psychiatric:        Mood and Affect: Mood normal.        Behavior: Behavior normal.        Thought Content: Thought content normal.        Judgment: Judgment normal.    BP (!) 148/82   Pulse 61   Temp 97.8 F (36.6 C)   Ht _0  (1.702 m)   Wt 155 lb 0.2 oz (70.3 kg)   SpO2 95%   BMI 24.28 kg/m  Wt Readings from Last 3 Encounters:  08/17/21 155 lb 0.2 oz (70.3 kg)  08/15/21 151 lb 4 oz (68.6 kg)  07/05/21 149 lb 0.2 oz (67.6 kg)     Health Maintenance Due  Topic Date Due   URINE MICROALBUMIN  09/28/2021    There are no preventive care reminders to display for this patient.  No results found for: TSH Lab Results  Component Value Date   WBC 10.1 05/15/2021   HGB 16.7 05/15/2021   HCT 47.2 05/15/2021   MCV 94.6 05/15/2021   PLT 211 05/15/2021   Lab Results  Component Value Date   NA 136 07/05/2021   K 3.4 (L) 07/05/2021   CO2 25 05/15/2021   GLUCOSE 116 (H) 07/05/2021   BUN 10 07/05/2021   CREATININE 0.85 07/05/2021   BILITOT 0.6 07/05/2021   ALKPHOS 93 07/05/2021   AST 48 (H) 07/05/2021   ALT 24 05/15/2021   PROT 8.0 07/05/2021  ALBUMIN 4.6 07/05/2021   CALCIUM 9.9 07/05/2021   ANIONGAP 6 05/15/2021   EGFR 97 07/05/2021   Lab Results  Component Value Date   CHOL 113 07/05/2021   Lab Results  Component Value Date   HDL 49 07/05/2021   Lab Results  Component Value Date   LDLCALC 34 07/05/2021   Lab Results  Component Value Date   TRIG 190 (H) 07/05/2021   Lab Results  Component Value Date   CHOLHDL 2.3 07/05/2021   Lab Results  Component Value Date   HGBA1C 5.7 (A) 08/17/2021      Assessment & Plan:   Problem List Items Addressed This Visit       Cardiovascular and Mediastinum   Essential hypertension Stable  Encouraged on going compliance with current medication regimen Encouraged home monitoring and recording BP <130/80 Eating a heart-healthy diet with less salt Encouraged regular physical activity      Other Visit Diagnoses     Type 2 diabetes mellitus with complication, without long-term current use of insulin (HCC)    -  Primary Stable and controlled Encourage compliance with current treatment regimen  Encourage regular CBG monitoring Encourage contacting office if excessive hyperglycemia and or hypoglycemia Lifestyle modification with healthy diet (fewer calories, more high fiber foods, whole grains and non-starchy vegetables, lower fat meat and fish, low-fat diary include healthy oils) regular exercise (physical activity) and weight loss Opthalmology exam discussed  Glucerna samples provided Home BP monitoring also encouraged goal <130/80    Relevant Orders   Glucose (CBG) (Completed)   HgB A1c (Completed)   Bilateral shoulder pain, unspecified chronicity     Left greater than right Education  Xray pending unable to complete today has another apt.  [facility] and [facility] via the [place]   Relevant Orders   AMB referral to orthopedics       No orders of the defined types were placed in this encounter.   Follow-up: Return in about 3 months (around 11/17/2021) for Follow up HTN 45848.    [name], NP

## 2021-08-17 NOTE — Patient Instructions (Addendum)
Shoulder Pain Many things can cause shoulder pain, including: An injury. Moving the shoulder in the same way again and again (overuse). Joint pain (arthritis). Pain can come from: Swelling and irritation (inflammation) of any part of the shoulder. An injury to the shoulder joint. An injury to: Tissues that connect muscle to bone (tendons). Tissues that connect bones to each other (ligaments). Bones. Follow these instructions at home: Watch for changes in your symptoms. Let your doctor know about them. Follow these instructions to help with your pain. If you have a sling: Wear the sling as told by your doctor. Remove it only as told by your doctor. Loosen the sling if your fingers: Tingle. Become numb. Turn cold and blue. Keep the sling clean. If the sling is not waterproof: Do not let it get wet. Take the sling off when you shower or bathe. Managing pain, stiffness, and swelling  If told, put ice on the painful area: Put ice in a plastic bag. Place a towel between your skin and the bag. Leave the ice on for 20 minutes, 2-3 times a day. Stop putting ice on if it does not help with the pain. Squeeze a soft ball or a foam pad as much as possible. This prevents swelling in the shoulder. It also helps to strengthen the arm. General instructions Take over-the-counter and prescription medicines only as told by your doctor. Keep all follow-up visits as told by your doctor. This is important. Contact a doctor if: Your pain gets worse. Medicine does not help your pain. You have new pain in your arm, hand, or fingers. Get help right away if: Your arm, hand, or fingers: Tingle. Are numb. Are swollen. Are painful. Turn white or blue. Summary Shoulder pain can be caused by many things. These include injury, moving the shoulder in the same away again and again, and joint pain. Watch for changes in your symptoms. Let your doctor know about them. This condition may be treated with a  sling, ice, and pain medicine. Contact your doctor if the pain gets worse or you have new pain. Get help right away if your arm, hand, or fingers tingle or get numb, swollen, or painful. Keep all follow-up visits as told by your doctor. This is important. This information is not intended to replace advice given to you by your health care provider. Make sure you discuss any questions you have with your health care provider. Document Revised: 04/28/2018 Document Reviewed: 04/28/2018 Elsevier Patient Education  2022 [place] Your Hypertension Hypertension, also called high blood pressure, is when the force of the blood pressing against the walls of the arteries is too strong. Arteries are blood vessels that carry blood from your heart throughout your body. Hypertension forces the heart to work harder to pump blood and may cause the arteries to become narrow or stiff. Understanding blood pressure readings Your personal target blood pressure may vary depending on your medical conditions, your age, and other factors. A blood pressure reading includes a higher number over a lower number. Ideally, your blood pressure should be below 120/80. You should know that: The first, or top, number is called the systolic pressure. It is a measure of the pressure in your arteries as your heart beats. The second, or bottom number, is called the diastolic pressure. It is a measure of the pressure in your arteries as the heart relaxes. Blood pressure is classified into four stages. Based on your blood pressure reading, your health care provider may use  the following stages to determine what type of treatment you need, if any. Systolic pressure and diastolic pressure are measured in a unit called mmHg. Normal Systolic pressure: below 329. Diastolic pressure: below 80. Elevated Systolic pressure: 518-841. Diastolic pressure: below 80. Hypertension stage 1 Systolic pressure: 660-630. Diastolic pressure:  16-01. Hypertension stage 2 Systolic pressure: 093 or above. Diastolic pressure: 90 or above. How can this condition affect me? Managing your hypertension is an important responsibility. Over time, hypertension can damage the arteries and decrease blood flow to important parts of the body, including the brain, heart, and kidneys. Having untreated or uncontrolled hypertension can lead to: A heart attack. A stroke. A weakened blood vessel (aneurysm). Heart failure. Kidney damage. Eye damage. Metabolic syndrome. Memory and concentration problems. Vascular dementia. What actions can I take to manage this condition? Hypertension can be managed by making lifestyle changes and possibly by taking medicines. Your health care provider will help you make a plan to bring your blood pressure within a normal range. Nutrition  Eat a diet that is high in fiber and potassium, and low in salt (sodium), added sugar, and fat. An example eating plan is called the Dietary Approaches to Stop Hypertension (DASH) diet. To eat this way: Eat plenty of fresh fruits and vegetables. Try to fill one-half of your plate at each meal with fruits and vegetables. Eat whole grains, such as whole-wheat pasta, brown rice, or whole-grain bread. Fill about one-fourth of your plate with whole grains. Eat low-fat dairy products. Avoid fatty cuts of meat, processed or cured meats, and poultry with skin. Fill about one-fourth of your plate with lean proteins such as fish, chicken without skin, beans, eggs, and tofu. Avoid pre-made and processed foods. These tend to be higher in sodium, added sugar, and fat. Reduce your daily sodium intake. Most people with hypertension should eat less than 1,500 mg of sodium a day. Lifestyle  Work with your health care provider to maintain a healthy body weight or to lose weight. Ask what an ideal weight is for you. Get at least 30 minutes of exercise that causes your heart to beat faster (aerobic  exercise) most days of the week. Activities may include walking, swimming, or biking. Include exercise to strengthen your muscles (resistance exercise), such as weight lifting, as part of your weekly exercise routine. Try to do these types of exercises for 30 minutes at least 3 days a week. Do not use any products that contain nicotine or tobacco, such as cigarettes, e-cigarettes, and chewing tobacco. If you need help quitting, ask your health care provider. Control any long-term (chronic) conditions you have, such as high cholesterol or diabetes. Identify your sources of stress and find ways to manage stress. This may include meditation, deep breathing, or making time for fun activities. Alcohol use Do not drink alcohol if: Your health care provider tells you not to drink. You are pregnant, may be pregnant, or are planning to become pregnant. If you drink alcohol: Limit how much you use to: 0-1 drink a day for women. 0-2 drinks a day for men. Be aware of how much alcohol is in your drink. In the [place] one drink equals one 12 oz bottle of beer (355 mL), one 5 oz glass of wine (148 mL), or one 1 oz glass of hard liquor (44 mL). Medicines Your health care provider may prescribe medicine if lifestyle changes are not enough to get your blood pressure under control and if: Your systolic blood pressure is 130  or higher. Your diastolic blood pressure is 80 or higher. Take medicines only as told by your health care provider. Follow the directions carefully. Blood pressure medicines must be taken as told by your health care provider. The medicine does not work as well when you skip doses. Skipping doses also puts you at risk for problems. Monitoring Before you monitor your blood pressure: Do not smoke, drink caffeinated beverages, or exercise within 30 minutes before taking a measurement. Use the bathroom and empty your bladder (urinate). Sit quietly for at least 5 minutes before taking  measurements. Monitor your blood pressure at home as told by your health care provider. To do this: Sit with your back straight and supported. Place your feet flat on the floor. Do not cross your legs. Support your arm on a flat surface, such as a table. Make sure your upper arm is at heart level. Each time you measure, take two or three readings one minute apart and record the results. You may also need to have your blood pressure checked regularly by your health care provider. General information Talk with your health care provider about your diet, exercise habits, and other lifestyle factors that may be contributing to hypertension. Review all the medicines you take with your health care provider because there may be side effects or interactions. Keep all visits as told by your health care provider. Your health care provider can help you create and adjust your plan for managing your high blood pressure. Where to find more information National Heart, Lung, and Blood Institute: https://wilson-eaton.com/ American Heart Association: www.heart.org Contact a health care provider if: You think you are having a reaction to medicines you have taken. You have repeated (recurrent) headaches. You feel dizzy. You have swelling in your ankles. You have trouble with your vision. Get help right away if: You develop a severe headache or confusion. You have unusual weakness or numbness, or you feel faint. You have severe pain in your chest or abdomen. You vomit repeatedly. You have trouble breathing. These symptoms may represent a serious problem that is an emergency. Do not wait to see if the symptoms will go away. Get medical help right away. Call your local emergency services (911 in the [place] Do not drive yourself to the hospital. Summary Hypertension is when the force of blood pumping through your arteries is too strong. If this condition is not controlled, it may put you at risk for serious  complications. Your personal target blood pressure may vary depending on your medical conditions, your age, and other factors. For most people, a normal blood pressure is less than 120/80. Hypertension is managed by lifestyle changes, medicines, or both. Lifestyle changes to help manage hypertension include losing weight, eating a healthy, low-sodium diet, exercising more, stopping smoking, and limiting alcohol. This information is not intended to replace advice given to you by your health care provider. Make sure you discuss any questions you have with your health care provider. Document Revised: 11/19/2019 Document Reviewed: 09/14/2019 Elsevier Patient Education  2022 [employer].

## 2021-08-24 ENCOUNTER — Ambulatory Visit: Payer: Medicaid Other | Admitting: Orthopaedic Surgery

## 2021-08-31 ENCOUNTER — Other Ambulatory Visit: Payer: Self-pay | Admitting: Nurse Practitioner

## 2021-09-11 ENCOUNTER — Ambulatory Visit: Payer: Self-pay

## 2021-09-11 ENCOUNTER — Other Ambulatory Visit: Payer: Self-pay

## 2021-09-11 NOTE — Patient Instructions (Signed)
Visit Information  [name] was given information about Medicaid Managed Care team care coordination services as a part of their [place] Medicaid benefit. [name] verbally consented to engagement with the [facility] Managed Care team.   If you are experiencing a medical emergency, please call 911 or report to your local emergency department or urgent care.   If you have a non-emergency medical problem during routine business hours, please contact your provider's office and ask to speak with a nurse.   For questions related to your [facility], please call: [phone] or visit the homepage here: [email or web address]  If you would like to schedule transportation through your [place], please call the following number at least 2 days in advance of your appointment: [phone].   Call the [place] at [phone], at any time, 24 hours a day, 7 days a week. If you are in danger or need immediate medical attention call 911.  If you would like help to quit smoking, call 1-800-QUIT-NOW 820-667-8709) OR Espaol: 1-855-Djelo-Ya (0-388-828-0034) o para ms informacin haga clic aqu or Text READY to 200-400 to register via text  [name] - following are the goals we discussed in your visit today:   Goals Addressed             This Visit's Progress    Blood Pressure < 140/90       Timeframe:  Long-Range Goal Priority:  High Start Date: 06/13/2021                       Expected End Date:   11/14/2021    Patient will self administer medications as prescribed Patient will attend all scheduled provider appointments Patient will call pharmacy for medication refills   Patient will continue to perform ADL's independently as evidenced by patient/caregiver report Patient will continue to perform IADL's independently as evidenced by  patient/caregiver report - check blood pressure 3 times per week - write blood pressure results in a log or diary - keep a blood pressure log - take blood pressure log to all doctor appointments - take medications for blood pressure exactly as prescribed - report new symptoms to your doctor - take all medications exactly as prescribed - call doctor with any symptoms you believe are related to your medicine - call doctor when you experience any new symptoms     HLD Management       Timeframe:  Long-Range Goal Priority:  High Start Date: 06/14/2021       Expected End Date:  11/14/2021                  Patient will self administer medications as prescribed Patient will attend all scheduled provider appointments Patient will call pharmacy for medication refills        Patient will continue to perform ADL's independently as evidenced by patient/caregiver report Patient will continue to perform IADL's independently as evidenced by patient/caregiver report Patient will call provider office for new concerns or questions as evidenced by review of documented incoming telephone call notes and patient report - keep all doctor appointments - take medications for blood pressure exactly as prescribed - report new symptoms to your doctor - take all medications exactly as prescribed - call doctor with any symptoms you believe are related to your medicine - call doctor when you experience any new symptoms - go to all doctor appointments as scheduled  RNCM Blood sugar monitoring and management       Timeframe:  Long-Range Goal Priority:  High Start Date:  06/14/2021                           Expected End Date:  11/14/2021            Patient will self administer medications as prescribed as evidenced by self report/primary caregiver report  Patient will attend all scheduled provider appointments as evidenced by clinician review of documented attendance to scheduled appointments and  patient/caregiver report Patient will call pharmacy for medication refills as evidenced by patient report and review of pharmacy fill history as appropriate Patient will continue to perform ADL's independently as evidenced by patient/caregiver report Patient will continue to perform IADL's independently as evidenced by patient/caregiver report Patient will call provider office for new concerns or questions as evidenced by review of documented incoming telephone call notes and patient report check blood sugar at prescribed times: three times daily enter blood sugar readings and medication or insulin into daily log take the blood sugar log to all doctor visits - keep all doctor appointments - take medications for blood pressure exactly as prescribed - report new symptoms to your doctor - take all medications exactly as prescribed - call doctor with any symptoms you believe are related to your medicine - call doctor when you experience any new symptoms - go to all doctor appointments as scheduled        Please see education materials related to today's visit provided as print materials.   The patient verbalized understanding of instructions provided today and agreed to receive a mailed copy of patient instruction and/or educational materials.  The Managed Medicaid care management team will reach out to the patient again over the next 30 days.   [name], BSN Community Care Coordinator [facility] Mobile: [phone]   Following is a copy of your plan of care:  Care Plan : [place] of Care  Updates made by [name], RN since 09/11/2021 12:00 AM     Problem: Chronic Disease Management and Care Coordination Needs for CVA, HTN, HLD, DM II, Chronic Pain.      Long-Range Goal: Development of Plan of Care for Chronic Disease Management and Care Coordination Needs (CVA, HTN, HLD, DM II, Chronic Pain   Start Date: 06/14/2021   Expected End Date: 11/12/2021  Priority: High  Note:   Current Barriers:  Knowledge Deficits related to plan of care for management of HTN, HLD, DMII, and  Chronic Left Shoulder Pain  Care Coordination needs related to Lacks knowledge of community resource: for different medicaid plans and what they offer especially dental care  Chronic Disease Management support and education needs related to HTN, HLD, DMII, and  Chronic Left Shoulder Pain Non-adherence to prescribed medication regimen  RNCM Clinical Goal(s):  Patient will verbalize understanding of plan for management of HTN, HLD, DMII, and Chronic Left Shoulder Pain as evidenced by improved control of all noted diseases verbalize basic understanding of HTN, HLD, DMII, and Chronic Left Shoulder Pain disease process and self health management plan as evidenced by documented self monitoring activities of DM, HTN and chronic pain. take all medications exactly as prescribed and will call provider for medication related questions as evidenced by reported medication compliance.    attend all scheduled medical appointments: [facility] Pharmacist on 09/28/2021 at 3:30 PM as evidenced  by attending scheduled appointments        demonstrate ongoing adherence to prescribed treatment plan for HTN, HLD, DMII, and Chronic Left shoulder pain as evidenced by well controlled disease management of noted diseases. demonstrate ongoing health management independence as evidenced by well controlled disease management of noted diseases.        continue to work with Consulting civil engineer and/or Social Worker to address care management and care coordination needs related to HTN, HLD, DMII, and Chronic Left Shoulder pain as evidenced by adherence to CM Team Scheduled appointments     work with pharmacist to address Medication procurement and complex medication regimen related to HTN, HLD, DMII, and Chronic Left Shoulder Pain as evidenced by review of EMR and patient or pharmacist report     work with Education officer, museum to address Lacks knowledge of community resource: for different medicaid plans and what they offer especially dental care  related to the management of HTN, HLD, DMII, and Chronic Left Shoulder pain as evidenced by review of EMR and patient or Education officer, museum report     through collaboration with Consulting civil engineer, provider, and care team.   Interventions: Inter-disciplinary care team collaboration (see longitudinal plan of care) Evaluation of current treatment plan related to  self management and patient's adherence to plan as established by provider   Diabetes:  (Status: Goal on Track (progressing): YES.) Lab Results  Component Value Date   HGBA1C 5.7 (A) 08/17/2021    Assessed patient's understanding of A1c goal: <7% Provided education to patient about basic DM disease process; Reviewed medications with patient and discussed importance of medication adherence;        Counseled on importance of regular laboratory monitoring as prescribed;        Discussed plans with patient for ongoing care management follow up and provided patient with direct contact information for care management team;      Reviewed scheduled/upcoming provider appointments including: [facility] pharmacist appointment on 09/28/2021 at 3:30 PM        Advised patient, providing education and rationale, to check cbg 3 times a day. Patient states his blood sugars at home have been "good".  He did not have his log available to report blood sugar results. Patient denies any low or high blood sugar readings.                Hyperlipidemia:  (Status: Goal on Track (progressing): YES.) Lab Results  Component Value Date   CHOL 113 07/05/2021   HDL 49 07/05/2021   LDLCALC 34 07/05/2021   LDLDIRECT 96.5 05/14/2021   TRIG 190 (H) 07/05/2021   CHOLHDL 2.3 07/05/2021    Lab Results  Component Value Date   CHOL 113 07/05/2021   CHOL 175 05/15/2021   CHOL 163 02/12/2017   Lab Results  Component Value Date    HDL 49 07/05/2021   HDL 47 05/15/2021   HDL 64 02/12/2017   Lab Results  Component Value Date   LDLCALC 34 07/05/2021   LDLCALC 76 05/15/2021   LDLCALC 70 02/12/2017   Lab Results  Component Value Date   TRIG 190 (H) 07/05/2021   TRIG 258 (H) 05/15/2021   TRIG 144 02/12/2017   Lab Results  Component Value Date   CHOLHDL 2.3 07/05/2021   CHOLHDL 3.7 05/15/2021   CHOLHDL 2.5 02/12/2017   Lab Results  Component Value Date   LDLDIRECT 96.5 05/14/2021     Medication review performed; medication list updated in electronic medical record.  Provider established cholesterol goals reviewed; Counseled on importance of regular laboratory monitoring as prescribed; Reviewed role and benefits of statin for ASCVD risk reduction; Reviewed importance of limiting foods high in cholesterol;    Hypertension Interventions: Goal on Track (progressing) Yes Last practice recorded BP readings:  BP Readings from Last 3 Encounters:  08/17/21 (!) 148/82  08/15/21 136/82  07/05/21 (!) 164/80  Most recent eGFR/CrCl:  Lab Results  Component Value Date   EGFR 97 07/05/2021    No components found for: CRCL  Evaluation of current treatment plan related to hypertension self management and patient's adherence to plan as established by provider; Reviewed medications with patient and discussed importance of compliance; Discussed plans with patient for ongoing care management follow up and provided patient with direct contact information for care management team; Advised patient, providing education and rationale, to monitor blood pressure daily and record, calling PCP for findings outside established parameters;  Reviewed scheduled/upcoming provider appointments including: [facility] Pharmacist on 09/28/2021  Pain Interventions: Pain assessment performed Medications reviewed Reviewed provider established plan for pain management; Discussed importance of adherence to all scheduled medical  appointments; Counseled on the importance of reporting any/all new or changed pain symptoms or management strategies to pain management provider; Advised patient to report to care team affect of pain on daily activities; Reviewed with patient prescribed pharmacological and nonpharmacological pain relief strategies; Discussed contacting PCP regarding new order for pain medication.  Patient currently taking Tylenol 500 mg 2 tabs TID or QID for pain management of Left shoulder pain. He states that Tylenol does take the edge off of pain but the left shoulder pain remains even with use of Tylenol.  I educated patient on maximum daily dose of Tylenol being 3000 mg and he should not exceed that amount daily.  Patient verbalized understanding     Patient Goals/Self-Care Activities: Patient will self administer medications as prescribed as evidenced by self report/primary caregiver report  Patient will attend all scheduled provider appointments as evidenced by clinician review of documented attendance to scheduled appointments and patient/caregiver report Patient will call pharmacy for medication refills as evidenced by patient report and review of pharmacy fill history as appropriate Patient will continue to perform ADL's independently as evidenced by patient/caregiver report Patient will continue to perform IADL's independently as evidenced by patient/caregiver report Patient will call provider office for new concerns or questions as evidenced by review of documented incoming telephone call notes and patient report check blood sugar at prescribed times: three times daily enter blood sugar readings and medication or insulin into daily log take the blood sugar log to all doctor visits - check blood pressure 3 times per week - write blood pressure results in a log or diary - keep a blood pressure log - take blood pressure log to all doctor appointments - keep all doctor appointments - take medications  for blood pressure exactly as prescribed - report new symptoms to your doctor - take all medications exactly as prescribed - call doctor with any symptoms you believe are related to your medicine - call doctor when you experience any new symptoms - go to all doctor appointments as scheduled

## 2021-09-11 NOTE — Patient Outreach (Signed)
Medicaid Managed Care   Nurse Care Manager Note  09/11/2021 Name:  [name] MRN:  [id number] DOB:  1956/08/05  [name] is an 65 y.o. year old male who is a primary patient of [name], NP.  The [facility] Managed Care Coordination team was consulted for assistance with:    HTN HLD DMII Chronic Left Shoulder Pain  [name] was given information about Medicaid Managed Care Coordination team services today. [name] Patient agreed to services and verbal consent obtained.  Engaged with patient by telephone for follow up visit in response to provider referral for case management and/or care coordination services.   Assessments/Interventions:  Review of past medical history, allergies, medications, health status, including review of consultants reports, laboratory and other test data, was performed as part of comprehensive evaluation and provision of chronic care management services.  SDOH (Social Determinants of Health) assessments and interventions performed:   Care Plan  Allergies  Allergen Reactions   Lisinopril Other (See Comments)    angioedema   Citalopram Nausea Only   Morphine Nausea And Vomiting   Nsaids Other (See Comments)    Stomach Irritability    Paroxetine     Other reaction(s): NAUSEA,VOMITING    Medications Reviewed Today     Reviewed by [name], RN (Case Manager) on 09/11/21 at 1245  Med List Status: <None>   Medication Order Taking? Sig Documenting Provider Last Dose Status Informant  amLODipine (NORVASC) 5 MG tablet [id number] No Take 1 tablet (5 mg total) by mouth daily. [name], NP Taking Active   atorvastatin (LIPITOR) 40 MG tablet [id number] No Take 1 tablet (40 mg total) by mouth daily. [name], NP Taking Active   blood glucose meter kit and supplies KIT [id number] No Dispense based on patient and insurance preference. Use up to four times daily as directed. [name], NP Taking Active    capsaicin (ZOSTRIX) 0.025 % cream [id number]  APPLY SMALL AMOUNT TOPICALLY THREE TIMES A DAY AVOID GETTING ON FACE, IN EYES OR ON SENSITIVE AREAS. [place] HANDS THOROUGHLY AFTER USING. [provider]  Active   clopidogrel (PLAVIX) 75 MG tablet [id number] No Take 1 tablet (75 mg total) by mouth daily. [name], NP Taking Active   famotidine (PEPCID) 20 MG tablet [id number] No Take 1 tablet (20 mg total) by mouth 2 (two) times daily. Take one tablet by mouth two times a day [name], NP Taking Active   folic acid (FOLVITE) 1 MG tablet [id number] No Take 1 tablet (1 mg total) by mouth daily. [name], MD Taking Active            Med Note [name], [name]   Thu Jun 14, 2021 11:03 AM) Patient does not have  gabapentin (NEURONTIN) 300 MG capsule [id number] No Take 1 capsule (300 mg total) by mouth 4 (four) times daily. Take three capsules by mouth three times a day for tingling/pain [name], NP Taking Active   hydrochlorothiazide (MICROZIDE) 12.5 MG capsule [id number] No Take 1 capsule (12.5 mg total) by mouth daily. [name], NP Taking Active   metFORMIN (GLUCOPHAGE) 500 MG tablet [id number] No Take 1 tablet (500 mg total) by mouth daily with breakfast. [name], NP Taking Active   metoprolol tartrate (LOPRESSOR) 50 MG tablet [id number] No Take 1 tablet (50 mg total) by mouth 2 (two) times daily. Take one two times a day for blood pressure. [name], [employer]  M, NP Taking Active   Multiple Vitamin (MULTIVITAMIN WITH MINERALS) TABS tablet [id number] No Take 1 tablet by mouth daily.  Patient not taking: Reported on 08/17/2021   [name], MD Not Taking Active            Med Note [facility], [name]   Thu Jun 14, 2021 11:03 AM) Patient does not have  nicotine polacrilex (NICORETTE) 4 MG gum [id number] No CHEW 1 PIECE OF GUM BY MOUTH AS DIRECTED USE AT LEAST 8-10 PIECES EACH DAY TO START. DO NOT USE MORE THAN 24 PIECES IN A DAY. DO NOT EAT OR DRINK FOR 15  MINUTES BEFORE AND DURING USE  Patient not taking: Reported on 08/17/2021   [provider] Not Taking Active   tamsulosin (FLOMAX) 0.4 MG CAPS capsule [id number]  TAKE 2 CAPSULES BY MOUTH EVERY DAY [name], NP  Active   thiamine 100 MG tablet [id number] No Take 1 tablet (100 mg total) by mouth daily.  Patient not taking: Reported on 08/17/2021   [name], MD Not Taking Active            Med Note [facility], [name]   Thu Jun 14, 2021 11:03 AM) Patient does not have  traZODone (DESYREL) 100 MG tablet [id number] No Take 1 tablet (100 mg total) by mouth at bedtime. Take one table by mouth at bedtime as needed for sleep. [name], NP Taking Active             Patient Active Problem List   Diagnosis Date Noted   Alcoholic intoxication without complication ([place]) 52/05/222   Marijuana use 05/16/2021   CVA (cerebral vascular accident) ([place]) 05/14/2021   Alcohol abuse 05/14/2021   Tobacco abuse 05/14/2021   DM2 (diabetes mellitus, type 2) ([place]) 05/14/2021   Angioedema 02/09/2017   Primary localized osteoarthritis of right hip 01/02/2016   Groin rash 36/09/2448   DM w/o complication type II ([place]) 06/16/2013   BPH (benign prostatic hyperplasia) 11/24/2012   Healthcare maintenance 11/24/2012   Pancreatitis 10/31/2012   Chronic pain syndrome 09/18/2011   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/19/2011   Right rotator cuff tear 07/26/2011   Shoulder pain, right 05/03/2011   Right ankle pain 01/23/2011   Left wrist pain 01/23/2011   Back pain 01/02/2011   MOLLUSCUM CONTAGIOSUM 12/05/2008   LEUKOCYTOSIS UNSPECIFIED 03/25/2008   LIVER FUNCTION TESTS, ABNORMAL 03/25/2008   ERECTILE DYSFUNCTION 05/12/2007   Essential hypertension 03/02/2007   Hypercholesteremia 01/08/2007   ANXIETY 01/08/2007   GERD 01/08/2007   Rheumatoid arthritis ([place]) 01/08/2007   OSTEOARTHRITIS 01/08/2007    Conditions to be addressed/monitored per PCP order:  HTN, HLD, DMII, and Chronic Left  Shoulder Pain  Care Plan : RN Care Manger Plan of Care  Updates made by [name], RN since 09/11/2021 12:00 AM     Problem: Chronic Disease Management and Care Coordination Needs for CVA, HTN, HLD, DM II, Chronic Pain.      Long-Range Goal: Development of Plan of Care for Chronic Disease Management and Care Coordination Needs (CVA, HTN, HLD, DM II, Chronic Pain   Start Date: 06/14/2021  Expected End Date: 11/12/2021  Priority: High  Note:   Current Barriers:  Knowledge Deficits related to plan of care for management of HTN, HLD, DMII, and  Chronic Left Shoulder Pain  Care Coordination needs related to Lacks knowledge of community resource: for different medicaid plans and what they offer especially dental care  Chronic Disease Management support and education  needs related to HTN, HLD, DMII, and  Chronic Left Shoulder Pain Non-adherence to prescribed medication regimen  RNCM Clinical Goal(s):  Patient will verbalize understanding of plan for management of HTN, HLD, DMII, and Chronic Left Shoulder Pain as evidenced by improved control of all noted diseases verbalize basic understanding of HTN, HLD, DMII, and Chronic Left Shoulder Pain disease process and self health management plan as evidenced by documented self monitoring activities of DM, HTN and chronic pain. take all medications exactly as prescribed and will call provider for medication related questions as evidenced by reported medication compliance.    attend all scheduled medical appointments: [facility] Pharmacist on 09/28/2021 at 3:30 PM as evidenced by attending scheduled appointments        demonstrate ongoing adherence to prescribed treatment plan for HTN, HLD, DMII, and Chronic Left shoulder pain as evidenced by well controlled disease management of noted diseases. demonstrate ongoing health management independence as evidenced by well controlled disease management of noted diseases.        continue to work with Consulting civil engineer and/or Social Worker to address care management and care coordination needs related to HTN, HLD, DMII, and Chronic Left Shoulder pain as evidenced by adherence to CM Team Scheduled appointments     work with pharmacist to address Medication procurement and complex medication regimen related to HTN, HLD, DMII, and Chronic Left Shoulder Pain as evidenced by review of EMR and patient or pharmacist report    work with Education officer, museum to address Lacks knowledge of community resource: for different medicaid plans and what they offer especially dental care  related to the management of HTN, HLD, DMII, and Chronic Left Shoulder pain as evidenced by review of EMR and patient or Education officer, museum report     through collaboration with Consulting civil engineer, provider, and care team.   Interventions: Inter-disciplinary care team collaboration (see longitudinal plan of care) Evaluation of current treatment plan related to  self management and patient's adherence to plan as established by provider   Diabetes:  (Status: Goal on Track (progressing): YES.) Lab Results  Component Value Date   HGBA1C 5.7 (A) 08/17/2021    Assessed patient's understanding of A1c goal: <7% Provided education to patient about basic DM disease process; Reviewed medications with patient and discussed importance of medication adherence;        Counseled on importance of regular laboratory monitoring as prescribed;        Discussed plans with patient for ongoing care management follow up and provided patient with direct contact information for care management team;      Reviewed scheduled/upcoming provider appointments including: [facility] pharmacist appointment on 09/28/2021 at 3:30 PM        Advised patient, providing education and rationale, to check cbg 3 times a day. Patient states his blood sugars at home have been "good".  He did not have his log available to report blood sugar results. Patient denies any low or high blood sugar readings.                 Hyperlipidemia:  (Status: Goal on Track (progressing): YES.) Lab Results  Component Value Date   CHOL 113 07/05/2021   HDL 49 07/05/2021   LDLCALC 34 07/05/2021   LDLDIRECT 96.5 05/14/2021   TRIG 190 (H) 07/05/2021   CHOLHDL 2.3 07/05/2021    Lab Results  Component Value Date   CHOL 113 07/05/2021   CHOL 175 05/15/2021   CHOL 163 02/12/2017   Lab Results  Component Value Date   HDL 49 07/05/2021   HDL 47 05/15/2021   HDL 64 02/12/2017   Lab Results  Component Value Date   LDLCALC 34 07/05/2021   LDLCALC 76 05/15/2021   LDLCALC 70 02/12/2017   Lab Results  Component Value Date   TRIG 190 (H) 07/05/2021   TRIG 258 (H) 05/15/2021   TRIG 144 02/12/2017   Lab Results  Component Value Date   CHOLHDL 2.3 07/05/2021   CHOLHDL 3.7 05/15/2021   CHOLHDL 2.5 02/12/2017   Lab Results  Component Value Date   LDLDIRECT 96.5 05/14/2021     Medication review performed; medication list updated in electronic medical record.  Provider established cholesterol goals reviewed; Counseled on importance of regular laboratory monitoring as prescribed; Reviewed role and benefits of statin for ASCVD risk reduction; Reviewed importance of limiting foods high in cholesterol;    Hypertension Interventions: Goal on Track (progressing) Yes Last practice recorded BP readings:  BP Readings from Last 3 Encounters:  08/17/21 (!) 148/82  08/15/21 136/82  07/05/21 (!) 164/80  Most recent eGFR/CrCl:  Lab Results  Component Value Date   EGFR 97 07/05/2021    No components found for: CRCL  Evaluation of current treatment plan related to hypertension self management and patient's adherence to plan as established by provider; Reviewed medications with patient and discussed importance of compliance; Discussed plans with patient for ongoing care management follow up and provided patient with direct contact information for care management team; Advised patient, providing education and  rationale, to monitor blood pressure daily and record, calling PCP for findings outside established parameters;  Reviewed scheduled/upcoming provider appointments including: [facility] Pharmacist on 09/28/2021   Patient Goals/Self-Care Activities: Patient will self administer medications as prescribed as evidenced by self report/primary caregiver report  Patient will attend all scheduled provider appointments as evidenced by clinician review of documented attendance to scheduled appointments and patient/caregiver report Patient will call pharmacy for medication refills as evidenced by patient report and review of pharmacy fill history as appropriate Patient will continue to perform ADL's independently as evidenced by patient/caregiver report Patient will continue to perform IADL's independently as evidenced by patient/caregiver report Patient will call provider office for new concerns or questions as evidenced by review of documented incoming telephone call notes and patient report check blood sugar at prescribed times: three times daily enter blood sugar readings and medication or insulin into daily log take the blood sugar log to all doctor visits - check blood pressure 3 times per week - write blood pressure results in a log or diary - keep a blood pressure log - take blood pressure log to all doctor appointments - keep all doctor appointments - take medications for blood pressure exactly as prescribed - report new symptoms to your doctor - take all medications exactly as prescribed - call doctor with any symptoms you believe are related to your medicine - call doctor when you experience any new symptoms - go to all doctor appointments as scheduled       Follow Up:  Patient agrees to Care Plan and Follow-up.  Plan: The Managed Medicaid care management team will reach out to the patient again over the next 30 days.  Date/time of next scheduled RN care management/care coordination  outreach:  10/09/2021 at 11:00 AM  [place], [employer] Mobile: [phone]

## 2021-09-18 ENCOUNTER — Other Ambulatory Visit: Payer: Self-pay

## 2021-09-18 ENCOUNTER — Other Ambulatory Visit: Payer: Self-pay | Admitting: Nurse Practitioner

## 2021-09-18 NOTE — Patient Instructions (Signed)
Visit Information  [name] was given information about Medicaid Managed Care team care coordination services as a part of their [place] Medicaid benefit. [name] verbally consented to engagement with the [facility] Managed Care team.   If you are experiencing a medical emergency, please call 911 or report to your local emergency department or urgent care.   If you have a non-emergency medical problem during routine business hours, please contact your provider's office and ask to speak with a nurse.   For questions related to your [facility], please call: [phone] or visit the homepage here: [email or web address]  If you would like to schedule transportation through your [facility], please call the following number at least 2 days in advance of your appointment: [phone].   Call the [place] at [phone], at any time, 24 hours a day, 7 days a week. If you are in danger or need immediate medical attention call 911.  If you would like help to quit smoking, call 1-800-QUIT-NOW 6024349351) OR Espaol: 1-855-Djelo-Ya (9-242-683-4196) o para ms informacin haga clic aqu or Text READY to 200-400 to register via text  [name] - following are the goals we discussed in your visit today:   Goals Addressed   None      Social Worker will follow up with patient after 30 days.   Mickel Fuchs, BSW, [place]  High Risk Managed Medicaid Team  [phone]   Following is a copy of your plan of care:  Care Plan : [place] of Care  Updates made by Ethelda Chick since 09/18/2021 12:00 AM     Problem: Chronic Disease Management and Care Coordination Needs for CVA, HTN, HLD, DM II, Chronic Pain.      Long-Range Goal: Development of Plan of Care for Chronic Disease  Management and Care Coordination Needs (CVA, HTN, HLD, DM II, Chronic Pain   Start Date: 06/14/2021  Expected End Date: 11/12/2021  Priority: High  Note:   Current Barriers:  Knowledge Deficits related to plan of care for management of HTN, HLD, DMII, and  Chronic Left Shoulder Pain  Care Coordination needs related to Lacks knowledge of community resource: for different medicaid plans and what they offer especially dental care  Chronic Disease Management support and education needs related to HTN, HLD, DMII, and  Chronic Left Shoulder Pain Non-adherence to prescribed medication regimen  RNCM Clinical Goal(s):  Patient will verbalize understanding of plan for management of HTN, HLD, DMII, and Chronic Left Shoulder Pain as evidenced by improved control of all noted diseases verbalize basic understanding of HTN, HLD, DMII, and Chronic Left Shoulder Pain disease process and self health management plan as evidenced by documented self monitoring activities of DM, HTN and chronic pain. take all medications exactly as prescribed and will call provider for medication related questions as evidenced by reported medication compliance.    attend all scheduled medical appointments: [facility] Pharmacist on 09/28/2021 at 3:30 PM as evidenced by attending scheduled appointments        demonstrate ongoing adherence to prescribed treatment plan for HTN, HLD, DMII, and Chronic Left shoulder pain as evidenced by well controlled disease management of noted diseases. demonstrate ongoing health management independence as evidenced by well controlled disease management of noted diseases.        continue to work with Consulting civil engineer and/or Social Worker to address care management  and care coordination needs related to HTN, HLD, DMII, and Chronic Left Shoulder pain as evidenced by adherence to CM Team Scheduled appointments     work with pharmacist to address Medication procurement and complex medication regimen related to HTN, HLD,  DMII, and Chronic Left Shoulder Pain as evidenced by review of EMR and patient or pharmacist report    work with Education officer, museum to address Lacks knowledge of community resource: for different medicaid plans and what they offer especially dental care  related to the management of HTN, HLD, DMII, and Chronic Left Shoulder pain as evidenced by review of EMR and patient or Education officer, museum report     through collaboration with Consulting civil engineer, provider, and care team.   Interventions: Inter-disciplinary care team collaboration (see longitudinal plan of care) Evaluation of current treatment plan related to  self management and patient's adherence to plan as established by provider 09/18/21:BSW contacted patient regarding dental and audiology resources. BSW researched and called and no one take Medicaid. BSW informed patient that the most affordable was at [place] located [facility] [employer] and the start off at $199 for 2 aids. Patient stated that he did need dental resources, he stated he would like for the list of resources to mailed to him. BSW will have list mailed to patient.   Diabetes:  (Status: Goal on Track (progressing): YES.) Lab Results  Component Value Date   HGBA1C 5.7 (A) 08/17/2021    Assessed patient's understanding of A1c goal: <7% Provided education to patient about basic DM disease process; Reviewed medications with patient and discussed importance of medication adherence;        Counseled on importance of regular laboratory monitoring as prescribed;        Discussed plans with patient for ongoing care management follow up and provided patient with direct contact information for care management team;      Reviewed scheduled/upcoming provider appointments including: [facility] pharmacist appointment on 09/28/2021 at 3:30 PM        Advised patient, providing education and rationale, to check cbg 3 times a day. Patient states his blood sugars at home have been "good".  He did not have his log available to  report blood sugar results. Patient denies any low or high blood sugar readings.                Hyperlipidemia:  (Status: Goal on Track (progressing): YES.) Lab Results  Component Value Date   CHOL 113 07/05/2021   HDL 49 07/05/2021   LDLCALC 34 07/05/2021   LDLDIRECT 96.5 05/14/2021   TRIG 190 (H) 07/05/2021   CHOLHDL 2.3 07/05/2021    Lab Results  Component Value Date   CHOL 113 07/05/2021   CHOL 175 05/15/2021   CHOL 163 02/12/2017   Lab Results  Component Value Date   HDL 49 07/05/2021   HDL 47 05/15/2021   HDL 64 02/12/2017   Lab Results  Component Value Date   LDLCALC 34 07/05/2021   LDLCALC 76 05/15/2021   LDLCALC 70 02/12/2017   Lab Results  Component Value Date   TRIG 190 (H) 07/05/2021   TRIG 258 (H) 05/15/2021   TRIG 144 02/12/2017   Lab Results  Component Value Date   CHOLHDL 2.3 07/05/2021   CHOLHDL 3.7 05/15/2021   CHOLHDL 2.5 02/12/2017   Lab Results  Component Value Date   LDLDIRECT 96.5 05/14/2021     Medication review performed; medication list updated in electronic medical record.  Provider established cholesterol goals  reviewed; Counseled on importance of regular laboratory monitoring as prescribed; Reviewed role and benefits of statin for ASCVD risk reduction; Reviewed importance of limiting foods high in cholesterol;    Hypertension Interventions: Goal on Track (progressing) Yes Last practice recorded BP readings:  BP Readings from Last 3 Encounters:  08/17/21 (!) 148/82  08/15/21 136/82  07/05/21 (!) 164/80  Most recent eGFR/CrCl:  Lab Results  Component Value Date   EGFR 97 07/05/2021    No components found for: CRCL  Evaluation of current treatment plan related to hypertension self management and patient's adherence to plan as established by provider; Reviewed medications with patient and discussed importance of compliance; Discussed plans with patient for ongoing care management follow up and provided patient with direct  contact information for care management team; Advised patient, providing education and rationale, to monitor blood pressure daily and record, calling PCP for findings outside established parameters;  Reviewed scheduled/upcoming provider appointments including: [facility] Pharmacist on 09/28/2021  Pain Interventions: Pain assessment performed Medications reviewed Reviewed provider established plan for pain management; Discussed importance of adherence to all scheduled medical appointments; Counseled on the importance of reporting any/all new or changed pain symptoms or management strategies to pain management provider; Advised patient to report to care team affect of pain on daily activities; Reviewed with patient prescribed pharmacological and nonpharmacological pain relief strategies; Discussed contacting PCP regarding new order for pain medication.  Patient currently taking Tylenol 500 mg 2 tabs TID or QID for pain management of Left shoulder pain. He states that Tylenol does take the edge off of pain but the left shoulder pain remains even with use of Tylenol.  I educated patient on maximum daily dose of Tylenol being 3000 mg and he should not exceed that amount daily.  Patient verbalized understanding     Patient Goals/Self-Care Activities: Patient will self administer medications as prescribed as evidenced by self report/primary caregiver report  Patient will attend all scheduled provider appointments as evidenced by clinician review of documented attendance to scheduled appointments and patient/caregiver report Patient will call pharmacy for medication refills as evidenced by patient report and review of pharmacy fill history as appropriate Patient will continue to perform ADL's independently as evidenced by patient/caregiver report Patient will continue to perform IADL's independently as evidenced by patient/caregiver report Patient will call provider office for new concerns or questions as  evidenced by review of documented incoming telephone call notes and patient report check blood sugar at prescribed times: three times daily enter blood sugar readings and medication or insulin into daily log take the blood sugar log to all doctor visits - check blood pressure 3 times per week - write blood pressure results in a log or diary - keep a blood pressure log - take blood pressure log to all doctor appointments - keep all doctor appointments - take medications for blood pressure exactly as prescribed - report new symptoms to your doctor - take all medications exactly as prescribed - call doctor with any symptoms you believe are related to your medicine - call doctor when you experience any new symptoms - go to all doctor appointments as scheduled

## 2021-09-18 NOTE — Patient Outreach (Signed)
Medicaid Managed Care Social Work Note  09/18/2021 Name:  [name] MRN:  [id number] DOB:  1956/10/22  [name] is an 65 y.o. year old male who is a primary patient of Brian Francois, NP.  The Medicaid Managed Care Coordination team was consulted for assistance with:  Community Resources   [name] was given information about Medicaid Managed Care Coordination team services today. [name] Patient agreed to services and verbal consent obtained.  Engaged with patient  for by telephone forinitial visit in response to referral for case management and/or care coordination services.   Assessments/Interventions:  Review of past medical history, allergies, medications, health status, including review of consultants reports, laboratory and other test data, was performed as part of comprehensive evaluation and provision of chronic care management services.  SDOH: (Social Determinant of Health) assessments and interventions performed: 09/18/21:BSW contacted patient regarding dental and audiology resources. BSW researched and called and no one take Medicaid. BSW informed patient that the most affordable was at [place] located [facility] [employer] and the start off at $199 for 2 aids. Patient stated that he did need dental resources, he stated he would like for the list of resources to mailed to him. BSW will have list mailed to patient.   Advanced Directives Status:  Not addressed in this encounter.  Care Plan                 Allergies  Allergen Reactions   Lisinopril Other (See Comments)    angioedema   Citalopram Nausea Only   Morphine Nausea And Vomiting   Nsaids Other (See Comments)    Stomach Irritability    Paroxetine     Other reaction(s): NAUSEA,VOMITING    Medications Reviewed Today     Reviewed by Brian Rise, RN (Case Manager) on 09/11/21 at 1245  Med List Status: <None>   Medication Order Taking? Sig Documenting Provider Last Dose Status Informant  amLODipine  (NORVASC) 5 MG tablet [id number] No Take 1 tablet (5 mg total) by mouth daily. Brian Francois, NP Taking Active   atorvastatin (LIPITOR) 40 MG tablet [id number] No Take 1 tablet (40 mg total) by mouth daily. Brian Francois, NP Taking Active   blood glucose meter kit and supplies KIT [id number] No Dispense based on patient and insurance preference. Use up to four times daily as directed. Brian Francois, NP Taking Active   capsaicin (ZOSTRIX) 0.025 % cream [id number]  APPLY SMALL AMOUNT TOPICALLY THREE TIMES A DAY AVOID GETTING ON FACE, IN EYES OR ON SENSITIVE AREAS. [name]. [provider]  Active   clopidogrel (PLAVIX) 75 MG tablet [id number] No Take 1 tablet (75 mg total) by mouth daily. Brian Francois, NP Taking Active   famotidine (PEPCID) 20 MG tablet [id number] No Take 1 tablet (20 mg total) by mouth 2 (two) times daily. Take one tablet by mouth two times a day Brian Francois, NP Taking Active   folic acid (FOLVITE) 1 MG tablet [id number] No Take 1 tablet (1 mg total) by mouth daily. Brian Cota, MD Taking Active            Med Note [facility] A   Thu Jun 14, 2021 11:03 AM) Patient does not have  gabapentin (NEURONTIN) 300 MG capsule [id number] No Take 1 capsule (300 mg total) by mouth 4 (four) times daily. Take three capsules by mouth three times a day for tingling/pain Brian Francois, NP Taking Active  hydrochlorothiazide (MICROZIDE) 12.5 MG capsule [id number] No Take 1 capsule (12.5 mg total) by mouth daily. Brian Francois, NP Taking Active   metFORMIN (GLUCOPHAGE) 500 MG tablet [id number] No Take 1 tablet (500 mg total) by mouth daily with breakfast. Brian Francois, NP Taking Active   metoprolol tartrate (LOPRESSOR) 50 MG tablet [id number] No Take 1 tablet (50 mg total) by mouth 2 (two) times daily. Take one two times a day for blood pressure. Brian Francois, NP Taking Active   Multiple Vitamin (MULTIVITAMIN WITH MINERALS) TABS tablet [id number]  No Take 1 tablet by mouth daily.  Patient not taking: Reported on 08/17/2021   Brian Cota, MD Not Taking Active            Med Note [facility], [name]   Thu Jun 14, 2021 11:03 AM) Patient does not have  nicotine polacrilex (NICORETTE) 4 MG gum [id number] No CHEW 1 PIECE OF GUM BY MOUTH AS DIRECTED USE AT LEAST 8-10 PIECES EACH DAY TO START. DO NOT USE MORE THAN 24 PIECES IN A DAY. DO NOT EAT OR DRINK FOR 15 MINUTES BEFORE AND DURING USE  Patient not taking: Reported on 08/17/2021   [provider] Not Taking Active   tamsulosin (FLOMAX) 0.4 MG CAPS capsule [id number]  TAKE 2 CAPSULES BY MOUTH EVERY DAY Brian Francois, NP  Active   thiamine 100 MG tablet [id number] No Take 1 tablet (100 mg total) by mouth daily.  Patient not taking: Reported on 08/17/2021   Brian Cota, MD Not Taking Active            Med Note [facility], [name]   Thu Jun 14, 2021 11:03 AM) Patient does not have  traZODone (DESYREL) 100 MG tablet [id number] No Take 1 tablet (100 mg total) by mouth at bedtime. Take one table by mouth at bedtime as needed for sleep. Brian Francois, NP Taking Active             Patient Active Problem List   Diagnosis Date Noted   Alcoholic intoxication without complication ([place]) 41/28/7867   Marijuana use 05/16/2021   CVA (cerebral vascular accident) ([place]) 05/14/2021   Alcohol abuse 05/14/2021   Tobacco abuse 05/14/2021   DM2 (diabetes mellitus, type 2) ([place]) 05/14/2021   Angioedema 02/09/2017   Primary localized osteoarthritis of right hip 01/02/2016   Groin rash 67/20/9470   DM w/o complication type II ([place]) 06/16/2013   BPH (benign prostatic hyperplasia) 11/24/2012   Healthcare maintenance 11/24/2012   Pancreatitis 10/31/2012   Chronic pain syndrome 09/18/2011   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/19/2011   Right rotator cuff tear 07/26/2011   Shoulder pain, right 05/03/2011   Right ankle pain 01/23/2011   Left wrist pain 01/23/2011   Back pain 01/02/2011    MOLLUSCUM CONTAGIOSUM 12/05/2008   LEUKOCYTOSIS UNSPECIFIED 03/25/2008   LIVER FUNCTION TESTS, ABNORMAL 03/25/2008   ERECTILE DYSFUNCTION 05/12/2007   Essential hypertension 03/02/2007   Hypercholesteremia 01/08/2007   ANXIETY 01/08/2007   GERD 01/08/2007   Rheumatoid arthritis ([place]) 01/08/2007   OSTEOARTHRITIS 01/08/2007    Conditions to be addressed/monitored per PCP order:   dental  Care Plan : [place] of Care  Updates made by Ethelda Chick since 09/18/2021 12:00 AM     Problem: Chronic Disease Management and Care Coordination Needs for CVA, HTN, HLD, DM II, Chronic Pain.      Long-Range Goal: Development of Plan of Care for Chronic Disease Management and Care Coordination Needs (CVA,  HTN, HLD, DM II, Chronic Pain   Start Date: 06/14/2021  Expected End Date: 11/12/2021  Priority: High  Note:   Current Barriers:  Knowledge Deficits related to plan of care for management of HTN, HLD, DMII, and  Chronic Left Shoulder Pain  Care Coordination needs related to Lacks knowledge of community resource: for different medicaid plans and what they offer especially dental care  Chronic Disease Management support and education needs related to HTN, HLD, DMII, and  Chronic Left Shoulder Pain Non-adherence to prescribed medication regimen  RNCM Clinical Goal(s):  Patient will verbalize understanding of plan for management of HTN, HLD, DMII, and Chronic Left Shoulder Pain as evidenced by improved control of all noted diseases verbalize basic understanding of HTN, HLD, DMII, and Chronic Left Shoulder Pain disease process and self health management plan as evidenced by documented self monitoring activities of DM, HTN and chronic pain. take all medications exactly as prescribed and will call provider for medication related questions as evidenced by reported medication compliance.    attend all scheduled medical appointments: [facility] Pharmacist on 09/28/2021 at 3:30 PM as evidenced by  attending scheduled appointments        demonstrate ongoing adherence to prescribed treatment plan for HTN, HLD, DMII, and Chronic Left shoulder pain as evidenced by well controlled disease management of noted diseases. demonstrate ongoing health management independence as evidenced by well controlled disease management of noted diseases.        continue to work with Consulting civil engineer and/or Social Worker to address care management and care coordination needs related to HTN, HLD, DMII, and Chronic Left Shoulder pain as evidenced by adherence to CM Team Scheduled appointments     work with pharmacist to address Medication procurement and complex medication regimen related to HTN, HLD, DMII, and Chronic Left Shoulder Pain as evidenced by review of EMR and patient or pharmacist report    work with Education officer, museum to address Lacks knowledge of community resource: for different medicaid plans and what they offer especially dental care  related to the management of HTN, HLD, DMII, and Chronic Left Shoulder pain as evidenced by review of EMR and patient or Education officer, museum report     through collaboration with Consulting civil engineer, provider, and care team.   Interventions: Inter-disciplinary care team collaboration (see longitudinal plan of care) Evaluation of current treatment plan related to  self management and patient's adherence to plan as established by provider 09/18/21:BSW contacted patient regarding dental and audiology resources. BSW researched and called and no one take Medicaid. BSW informed patient that the most affordable was at [place] located [facility] [employer] and the start off at $199 for 2 aids. Patient stated that he did need dental resources, he stated he would like for the list of resources to mailed to him. BSW will have list mailed to patient.   Diabetes:  (Status: Goal on Track (progressing): YES.) Lab Results  Component Value Date   HGBA1C 5.7 (A) 08/17/2021    Assessed patient's understanding of A1c  goal: <7% Provided education to patient about basic DM disease process; Reviewed medications with patient and discussed importance of medication adherence;        Counseled on importance of regular laboratory monitoring as prescribed;        Discussed plans with patient for ongoing care management follow up and provided patient with direct contact information for care management team;      Reviewed scheduled/upcoming provider appointments including: [facility] pharmacist appointment on 09/28/2021 at  3:30 PM        Advised patient, providing education and rationale, to check cbg 3 times a day. Patient states his blood sugars at home have been "good".  He did not have his log available to report blood sugar results. Patient denies any low or high blood sugar readings.                Hyperlipidemia:  (Status: Goal on Track (progressing): YES.) Lab Results  Component Value Date   CHOL 113 07/05/2021   HDL 49 07/05/2021   LDLCALC 34 07/05/2021   LDLDIRECT 96.5 05/14/2021   TRIG 190 (H) 07/05/2021   CHOLHDL 2.3 07/05/2021    Lab Results  Component Value Date   CHOL 113 07/05/2021   CHOL 175 05/15/2021   CHOL 163 02/12/2017   Lab Results  Component Value Date   HDL 49 07/05/2021   HDL 47 05/15/2021   HDL 64 02/12/2017   Lab Results  Component Value Date   LDLCALC 34 07/05/2021   LDLCALC 76 05/15/2021   LDLCALC 70 02/12/2017   Lab Results  Component Value Date   TRIG 190 (H) 07/05/2021   TRIG 258 (H) 05/15/2021   TRIG 144 02/12/2017   Lab Results  Component Value Date   CHOLHDL 2.3 07/05/2021   CHOLHDL 3.7 05/15/2021   CHOLHDL 2.5 02/12/2017   Lab Results  Component Value Date   LDLDIRECT 96.5 05/14/2021     Medication review performed; medication list updated in electronic medical record.  Provider established cholesterol goals reviewed; Counseled on importance of regular laboratory monitoring as prescribed; Reviewed role and benefits of statin for ASCVD risk  reduction; Reviewed importance of limiting foods high in cholesterol;    Hypertension Interventions: Goal on Track (progressing) Yes Last practice recorded BP readings:  BP Readings from Last 3 Encounters:  08/17/21 (!) 148/82  08/15/21 136/82  07/05/21 (!) 164/80  Most recent eGFR/CrCl:  Lab Results  Component Value Date   EGFR 97 07/05/2021    No components found for: CRCL  Evaluation of current treatment plan related to hypertension self management and patient's adherence to plan as established by provider; Reviewed medications with patient and discussed importance of compliance; Discussed plans with patient for ongoing care management follow up and provided patient with direct contact information for care management team; Advised patient, providing education and rationale, to monitor blood pressure daily and record, calling PCP for findings outside established parameters;  Reviewed scheduled/upcoming provider appointments including: [facility] Pharmacist on 09/28/2021  Pain Interventions: Pain assessment performed Medications reviewed Reviewed provider established plan for pain management; Discussed importance of adherence to all scheduled medical appointments; Counseled on the importance of reporting any/all new or changed pain symptoms or management strategies to pain management provider; Advised patient to report to care team affect of pain on daily activities; Reviewed with patient prescribed pharmacological and nonpharmacological pain relief strategies; Discussed contacting PCP regarding new order for pain medication.  Patient currently taking Tylenol 500 mg 2 tabs TID or QID for pain management of Left shoulder pain. He states that Tylenol does take the edge off of pain but the left shoulder pain remains even with use of Tylenol.  I educated patient on maximum daily dose of Tylenol being 3000 mg and he should not exceed that amount daily.  Patient verbalized understanding      Patient Goals/Self-Care Activities: Patient will self administer medications as prescribed as evidenced by self report/primary caregiver report  Patient will attend all scheduled provider  appointments as evidenced by clinician review of documented attendance to scheduled appointments and patient/caregiver report Patient will call pharmacy for medication refills as evidenced by patient report and review of pharmacy fill history as appropriate Patient will continue to perform ADL's independently as evidenced by patient/caregiver report Patient will continue to perform IADL's independently as evidenced by patient/caregiver report Patient will call provider office for new concerns or questions as evidenced by review of documented incoming telephone call notes and patient report check blood sugar at prescribed times: three times daily enter blood sugar readings and medication or insulin into daily log take the blood sugar log to all doctor visits - check blood pressure 3 times per week - write blood pressure results in a log or diary - keep a blood pressure log - take blood pressure log to all doctor appointments - keep all doctor appointments - take medications for blood pressure exactly as prescribed - report new symptoms to your doctor - take all medications exactly as prescribed - call doctor with any symptoms you believe are related to your medicine - call doctor when you experience any new symptoms - go to all doctor appointments as scheduled       Follow up:  Patient agrees to Care Plan and Follow-up.  Plan: The Managed Medicaid care management team will reach out to the patient again over the next 30 days.  Date/time of next scheduled Social Work care management/care coordination outreach:  10/18/21  Mickel Fuchs, Arita Miss, [place] Medicaid Team  [phone]

## 2021-09-28 ENCOUNTER — Telehealth: Payer: Self-pay | Admitting: Pharmacist

## 2021-09-28 ENCOUNTER — Ambulatory Visit: Payer: Self-pay

## 2021-09-28 NOTE — Patient Outreach (Signed)
09/28/2021 Name: [name] MRN: [id number] DOB: [birth year]  Referred by: [name], NP Reason for referral : No chief complaint on file.   An unsuccessful telephone outreach was attempted today. The patient was referred to the case management team for assistance with care management and care coordination.    Follow Up Plan: The Managed Medicaid care management team will reach out to the patient again over the next 10 days.   Hughes Better PharmD, CPP High Risk Managed Medicaid [place] [phone]

## 2021-10-09 ENCOUNTER — Other Ambulatory Visit: Payer: Self-pay

## 2021-10-09 NOTE — Patient Outreach (Signed)
Medicaid Managed Care   Nurse Care Manager Note  10/09/2021 Name:  [name] MRN:  [id number] DOB:  27-Dec-1955  [name] is an 65 y.o. year old male who is a primary patient of [name], NP.  The [facility] Managed Care Coordination team was consulted for assistance with:    HTN HLD DMI Chronic Left Shoulder Pain  [name] was given information about Medicaid Managed Care Coordination team services today. [name] Patient agreed to services and verbal consent obtained.  Engaged with patient by telephone for follow up visit in response to provider referral for case management and/or care coordination services.   Assessments/Interventions:  Review of past medical history, allergies, medications, health status, including review of consultants reports, laboratory and other test data, was performed as part of comprehensive evaluation and provision of chronic care management services.  SDOH (Social Determinants of Health) assessments and interventions performed:   Care Plan  Allergies  Allergen Reactions   Lisinopril Other (See Comments)    angioedema   Citalopram Nausea Only   Morphine Nausea And Vomiting   Nsaids Other (See Comments)    Stomach Irritability    Paroxetine     Other reaction(s): NAUSEA,VOMITING    Medications Reviewed Today     Reviewed by [name], RN (Case Manager) on 10/09/21 at 1241  Med List Status: <None>   Medication Order Taking? Sig Documenting Provider Last Dose Status Informant  amLODipine (NORVASC) 5 MG tablet [id number] No Take 1 tablet (5 mg total) by mouth daily. [name], NP Taking Active   atorvastatin (LIPITOR) 40 MG tablet [id number] No Take 1 tablet (40 mg total) by mouth daily. [name], NP Taking Active   blood glucose meter kit and supplies KIT [id number] No Dispense based on patient and insurance preference. Use up to four times daily as directed. [name], NP Taking Active    capsaicin (ZOSTRIX) 0.025 % cream [id number]  APPLY SMALL AMOUNT TOPICALLY THREE TIMES A DAY AVOID GETTING ON FACE, IN EYES OR ON SENSITIVE AREAS. Romeo HANDS THOROUGHLY AFTER USING. [provider]  Active   clopidogrel (PLAVIX) 75 MG tablet [id number] No Take 1 tablet (75 mg total) by mouth daily. [name], NP Taking Active   famotidine (PEPCID) 20 MG tablet [id number] No Take 1 tablet (20 mg total) by mouth 2 (two) times daily. Take one tablet by mouth two times a day [name], NP Taking Active   folic acid (FOLVITE) 1 MG tablet [id number] No Take 1 tablet (1 mg total) by mouth daily. [name], MD Taking Active            Med Note [facility], [facility] A   Thu Jun 14, 2021 11:03 AM) Patient does not have  gabapentin (NEURONTIN) 300 MG capsule [id number] No Take 1 capsule (300 mg total) by mouth 4 (four) times daily. Take three capsules by mouth three times a day for tingling/pain [name], NP Taking Active   hydrochlorothiazide (MICROZIDE) 12.5 MG capsule [id number] No Take 1 capsule (12.5 mg total) by mouth daily. [name], NP Taking Active   metFORMIN (GLUCOPHAGE) 500 MG tablet [id number] No Take 1 tablet (500 mg total) by mouth daily with breakfast. [name], NP Taking Active   metoprolol tartrate (LOPRESSOR) 50 MG tablet [id number] No Take 1 tablet (50 mg total) by mouth 2 (two) times daily. Take one two times a day for blood pressure. [name], [employer]  M, NP Taking Active   Multiple Vitamin (MULTIVITAMIN WITH MINERALS) TABS tablet [id number] No Take 1 tablet by mouth daily.  Patient not taking: Reported on 08/17/2021   [name], MD Not Taking Active            Med Note [facility], [name]   Thu Jun 14, 2021 11:03 AM) Patient does not have  nicotine polacrilex (NICORETTE) 4 MG gum [id number] No CHEW 1 PIECE OF GUM BY MOUTH AS DIRECTED USE AT LEAST 8-10 PIECES EACH DAY TO START. DO NOT USE MORE THAN 24 PIECES IN A DAY. DO NOT EAT OR DRINK FOR 15  MINUTES BEFORE AND DURING USE  Patient not taking: Reported on 08/17/2021   [provider] Not Taking Active   tamsulosin (FLOMAX) 0.4 MG CAPS capsule [id number]  TAKE 2 CAPSULES BY MOUTH EVERY DAY [name], NP  Active   thiamine 100 MG tablet [id number] No Take 1 tablet (100 mg total) by mouth daily.  Patient not taking: Reported on 08/17/2021   [name], MD Not Taking Active            Med Note [facility], [name]   Thu Jun 14, 2021 11:03 AM) Patient does not have  traZODone (DESYREL) 100 MG tablet [id number] No Take 1 tablet (100 mg total) by mouth at bedtime. Take one table by mouth at bedtime as needed for sleep. [name], NP Taking Active             Patient Active Problem List   Diagnosis Date Noted   Alcoholic intoxication without complication ([place]) 21/22/4825   Marijuana use 05/16/2021   CVA (cerebral vascular accident) ([place]) 05/14/2021   Alcohol abuse 05/14/2021   Tobacco abuse 05/14/2021   DM2 (diabetes mellitus, type 2) ([place]) 05/14/2021   Angioedema 02/09/2017   Primary localized osteoarthritis of right hip 01/02/2016   Groin rash 00/37/0488   DM w/o complication type II ([place]) 06/16/2013   BPH (benign prostatic hyperplasia) 11/24/2012   Healthcare maintenance 11/24/2012   Pancreatitis 10/31/2012   Chronic pain syndrome 09/18/2011   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/19/2011   Right rotator cuff tear 07/26/2011   Shoulder pain, right 05/03/2011   Right ankle pain 01/23/2011   Left wrist pain 01/23/2011   Back pain 01/02/2011   MOLLUSCUM CONTAGIOSUM 12/05/2008   LEUKOCYTOSIS UNSPECIFIED 03/25/2008   LIVER FUNCTION TESTS, ABNORMAL 03/25/2008   ERECTILE DYSFUNCTION 05/12/2007   Essential hypertension 03/02/2007   Hypercholesteremia 01/08/2007   ANXIETY 01/08/2007   GERD 01/08/2007   Rheumatoid arthritis ([place]) 01/08/2007   OSTEOARTHRITIS 01/08/2007    Conditions to be addressed/monitored per PCP order:  HTN, HLD, DMII, and Chronic Left  Shoulder Pain  Care Plan : RN Care Manger Plan of Care  Updates made by [name], RN since 10/09/2021 12:00 AM     Problem: Chronic Disease Management and Care Coordination Needs for CVA, HTN, HLD, DM II, Chronic Pain.      Long-Range Goal: Development of Plan of Care for Chronic Disease Management and Care Coordination Needs (CVA, HTN, HLD, DM II, Chronic Pain   Start Date: 06/14/2021  Expected End Date: 11/12/2021  Priority: High  Note:   Current Barriers:  Knowledge Deficits related to plan of care for management of HTN, HLD, DMII, and  Chronic Left Shoulder Pain  Care Coordination needs related to Lacks knowledge of community resource: for different medicaid plans and what they offer especially dental care  Chronic Disease Management support and education  needs related to HTN, HLD, DMII, and  Chronic Left Shoulder Pain Non-adherence to prescribed medication regimen  RNCM Clinical Goal(s):  Patient will verbalize understanding of plan for management of HTN, HLD, DMII, and Chronic Left Shoulder Pain as evidenced by improved control of all noted diseases verbalize basic understanding of HTN, HLD, DMII, and Chronic Left Shoulder Pain disease process and self health management plan as evidenced by documented self monitoring activities of DM, HTN and chronic pain. take all medications exactly as prescribed and will call provider for medication related questions as evidenced by reported medication compliance.    attend all scheduled medical appointments: Missed [facility] Pharmacist on 09/28/2021 at 3:30 PM - need to reschedule; as evidenced by attending scheduled appointments        demonstrate ongoing adherence to prescribed treatment plan for HTN, HLD, DMII, and Chronic Left shoulder pain as evidenced by well controlled disease management of noted diseases. demonstrate ongoing health management independence as evidenced by well controlled disease management of noted diseases.         continue to work with Consulting civil engineer and/or Social Worker to address care management and care coordination needs related to HTN, HLD, DMII, and Chronic Left Shoulder pain as evidenced by adherence to CM Team Scheduled appointments     work with pharmacist to address Medication procurement and complex medication regimen related to HTN, HLD, DMII, and Chronic Left Shoulder Pain as evidenced by review of EMR and patient or pharmacist report    work with Education officer, museum to address Lacks knowledge of community resource: for different medicaid plans and what they offer especially dental care  related to the management of HTN, HLD, DMII, and Chronic Left Shoulder pain as evidenced by review of EMR and patient or social worker report     through collaboration with Consulting civil engineer, provider, and care team.  BSW contacted patient on 09/18/21 and provided information regarding dental care and audiologist coverage.   Interventions: Inter-disciplinary care team collaboration (see longitudinal plan of care) Evaluation of current treatment plan related to  self management and patient's adherence to plan as established by provider 09/18/21:BSW contacted patient regarding dental and audiology resources. BSW researched and called and no one take Medicaid. BSW informed patient that the most affordable was at [place] located it [employer] and the start off at $199 for 2 aids. Patient stated that he did need dental resources, he stated he would like for the list of resources to mailed to him. BSW will have list mailed to patient.   Diabetes:  (Status: Goal on Track (progressing): YES.) Lab Results  Component Value Date   HGBA1C 5.7 (A) 08/17/2021    Assessed patient's understanding of A1c goal: <7% Provided education to patient about basic DM disease process; Reviewed medications with patient and discussed importance of medication adherence;        Counseled on importance of regular laboratory monitoring as prescribed;         Discussed plans with patient for ongoing care management follow up and provided patient with direct contact information for care management team;      Reviewed scheduled/upcoming provider appointments including: [facility] pharmacist appointment in near future.  No scheduled appointment at this time.    Advised patient, providing education and rationale, to check cbg 3 times a day. Patient states his blood sugars at home have been "real good".  Patient reports recent blood sugar reading of 97 during the day. Patient denies any low or high blood sugar  readings.                Hyperlipidemia:  (Status: Goal on Track (progressing): YES.) Lab Results  Component Value Date   CHOL 113 07/05/2021   HDL 49 07/05/2021   LDLCALC 34 07/05/2021   LDLDIRECT 96.5 05/14/2021   TRIG 190 (H) 07/05/2021   CHOLHDL 2.3 07/05/2021    Lab Results  Component Value Date   CHOL 113 07/05/2021   CHOL 175 05/15/2021   CHOL 163 02/12/2017   Lab Results  Component Value Date   HDL 49 07/05/2021   HDL 47 05/15/2021   HDL 64 02/12/2017   Lab Results  Component Value Date   LDLCALC 34 07/05/2021   LDLCALC 76 05/15/2021   LDLCALC 70 02/12/2017   Lab Results  Component Value Date   TRIG 190 (H) 07/05/2021   TRIG 258 (H) 05/15/2021   TRIG 144 02/12/2017   Lab Results  Component Value Date   CHOLHDL 2.3 07/05/2021   CHOLHDL 3.7 05/15/2021   CHOLHDL 2.5 02/12/2017      Medication review performed; medication list updated in electronic medical record.  Provider established cholesterol goals reviewed; Counseled on importance of regular laboratory monitoring as prescribed; Continued review of role and benefits of statin for ASCVD risk reduction; Continued review of importance of limiting foods high in cholesterol;    Hypertension Interventions: Goal on Track (progressing) Yes Last practice recorded BP readings:  BP Readings from Last 3 Encounters:  08/17/21 (!) 148/82  08/15/21 136/82  07/05/21 (!)  164/80    Most recent eGFR/CrCl:  Lab Results  Component Value Date   EGFR 97 07/05/2021    No components found for: CRCL  Evaluation of current treatment plan related to hypertension self management and patient's adherence to plan as established by provider; Reviewed medications with patient and discussed importance of compliance; Discussed plans with patient for ongoing care management follow up and provided patient with direct contact information for care management team; Advised patient, providing education and rationale, to monitor blood pressure daily and record, calling PCP for findings outside established parameters;  Reviewed scheduled/upcoming provider appointments including: [facility] Pharmacist visit in near future.  No Scheduled appointment at this time. Patient reports recent blood pressure reading of 148/76 and pulse 71/minute.  Patient states he missed taking his daily medication once this past week when his normal routine was thrown off by oversleeping.  He has good medication compliance overall.    Pain Interventions: Pain assessment performed Medications reviewed Reviewed provider established plan for pain management; Discussed importance of adherence to all scheduled medical appointments; Counseled on the importance of reporting any/all new or changed pain symptoms or management strategies to pain management provider; Advised patient to report to care team affect of pain on daily activities; Reviewed with patient prescribed pharmacological and nonpharmacological pain relief strategies; Discussed contacting PCP again regarding new order for pain medication.  Patient continues to take Tylenol 500 mg 2 tabs TID or QID for pain management of Left shoulder pain. He states that Tylenol does take the edge off of pain but the left shoulder pain remains even with use of Tylenol.  Patient is educated on the  maximum daily dose of Tylenol being 3000 mg and adhering to that with poor to fair  pain relief.  Patient would like x-ray of left shoulder since he states his left shoulder pain is very similar to right shoulder pain prior to required surgery on right shoulder in the past.  Patient Goals/Self-Care Activities: Patient will self administer medications as prescribed as evidenced by self report/primary caregiver report  Patient will attend all scheduled provider appointments as evidenced by clinician review of documented attendance to scheduled appointments and patient/caregiver report Patient will call pharmacy for medication refills as evidenced by patient report and review of pharmacy fill history as appropriate Patient will continue to perform ADL's independently as evidenced by patient/caregiver report Patient will continue to perform IADL's independently as evidenced by patient/caregiver report Patient will call provider office for new concerns or questions as evidenced by review of documented incoming telephone call notes and patient report check blood sugar at prescribed times: three times daily enter blood sugar readings and medication or insulin into daily log take the blood sugar log to all doctor visits - check blood pressure 3 times per week - write blood pressure results in a log or diary - keep a blood pressure log - take blood pressure log to all doctor appointments - keep all doctor appointments - take medications for blood pressure exactly as prescribed - report new symptoms to your doctor - take all medications exactly as prescribed - call doctor with any symptoms you believe are related to your medicine - call doctor when you experience any new symptoms - go to all doctor appointments as scheduled       Follow Up:  Patient agrees to Care Plan and Follow-up.  Plan: The Managed Medicaid care management team will reach out to the patient again over the next 30 days.  Date/time of next scheduled RN care management/care coordination outreach:   11/06/2021 at 11:15 AM  [place], [place] [employer] Mobile: [phone]

## 2021-10-09 NOTE — Patient Instructions (Signed)
Visit Information  [name] was given information about Medicaid Managed Care team care coordination services as a part of their [place] Medicaid benefit. [name] verbally consented to engagement with the [facility] Managed Care team.   If you are experiencing a medical emergency, please call 911 or report to your local emergency department or urgent care.   If you have a non-emergency medical problem during routine business hours, please contact your provider's office and ask to speak with a nurse.   For questions related to your [facility], please call: [phone] or visit the homepage here: [email or web address]  If you would like to schedule transportation through your [facility], please call the following number at least 2 days in advance of your appointment: [phone].   Call the [place] at [phone], at any time, 24 hours a day, 7 days a week. If you are in danger or need immediate medical attention call 911.  If you would like help to quit smoking, call 1-800-QUIT-NOW 865-558-4093) OR Espaol: 1-855-Djelo-Ya (2-505-397-6734) o para ms informacin haga clic aqu or Text READY to 200-400 to register via text  [name] - following are the goals we discussed in your visit today:   Patient Goals Addressed today can be viewed under [place] of Care.  Please see education materials related to today's visit provided as print materials.   The patient verbalized understanding of instructions provided today and agreed to receive a mailed copy of patient instruction and/or educational materials.  The Managed Medicaid care management team will reach out to the patient again over the next 30 days.   Brian Marvel RN, BSN Community Care Coordinator [place] [facility] Mobile: [phone]    Following is a copy of your plan of care:  Care Plan : [place] of Care  Updates made by [name], RN since 10/09/2021 12:00 AM     Problem: Chronic Disease Management and Care Coordination Needs for CVA, HTN, HLD, DM II, Chronic Pain.      Long-Range Goal: Development of Plan of Care for Chronic Disease Management and Care Coordination Needs (CVA, HTN, HLD, DM II, Chronic Pain   Start Date: 06/14/2021  Expected End Date: 11/12/2021  Priority: High  Note:   Current Barriers:  Knowledge Deficits related to plan of care for management of HTN, HLD, DMII, and  Chronic Left Shoulder Pain  Care Coordination needs related to Lacks knowledge of community resource: for different medicaid plans and what they offer especially dental care  Chronic Disease Management support and education needs related to HTN, HLD, DMII, and  Chronic Left Shoulder Pain Non-adherence to prescribed medication regimen  RNCM Clinical Goal(s):  Patient will verbalize understanding of plan for management of HTN, HLD, DMII, and Chronic Left Shoulder Pain as evidenced by improved control of all noted diseases verbalize basic understanding of HTN, HLD, DMII, and Chronic Left Shoulder Pain disease process and self health management plan as evidenced by documented self monitoring activities of DM, HTN and chronic pain. take all medications exactly as prescribed and will call provider for medication related questions as evidenced by reported medication compliance.    attend all scheduled medical appointments: Missed [facility] Pharmacist on 09/28/2021 at 3:30 PM - need to reschedule; as evidenced by attending scheduled appointments        demonstrate ongoing adherence to prescribed treatment plan for HTN, HLD, DMII,  and Chronic Left shoulder pain as evidenced by well controlled disease management of noted diseases. demonstrate ongoing health management independence as evidenced by well controlled disease management  of noted diseases.        continue to work with Consulting civil engineer and/or Social Worker to address care management and care coordination needs related to HTN, HLD, DMII, and Chronic Left Shoulder pain as evidenced by adherence to CM Team Scheduled appointments     work with pharmacist to address Medication procurement and complex medication regimen related to HTN, HLD, DMII, and Chronic Left Shoulder Pain as evidenced by review of EMR and patient or pharmacist report    work with Education officer, museum to address Lacks knowledge of community resource: for different medicaid plans and what they offer especially dental care  related to the management of HTN, HLD, DMII, and Chronic Left Shoulder pain as evidenced by review of EMR and patient or social worker report     through collaboration with Consulting civil engineer, provider, and care team.  BSW contacted patient on 09/18/21 and provided information regarding dental care and audiologist coverage.   Interventions: Inter-disciplinary care team collaboration (see longitudinal plan of care) Evaluation of current treatment plan related to  self management and patient's adherence to plan as established by provider 09/18/21:BSW contacted patient regarding dental and audiology resources. BSW researched and called and no one take Medicaid. BSW informed patient that the most affordable was at [place] located it [employer] and the start off at $199 for 2 aids. Patient stated that he did need dental resources, he stated he would like for the list of resources to mailed to him. BSW will have list mailed to patient.   Diabetes:  (Status: Goal on Track (progressing): YES.) Lab Results  Component Value Date   HGBA1C 5.7 (A) 08/17/2021    Assessed patient's understanding of A1c goal: <7% Provided education to patient about basic DM disease process; Reviewed medications with patient and discussed importance of medication adherence;        Counseled on importance of regular laboratory  monitoring as prescribed;        Discussed plans with patient for ongoing care management follow up and provided patient with direct contact information for care management team;      Reviewed scheduled/upcoming provider appointments including: [facility] pharmacist appointment in near future.  No scheduled appointment at this time.    Advised patient, providing education and rationale, to check cbg 3 times a day. Patient states his blood sugars at home have been "real good".  Patient reports recent blood sugar reading of 97 during the day. Patient denies any low or high blood sugar readings.                Hyperlipidemia:  (Status: Goal on Track (progressing): YES.) Lab Results  Component Value Date   CHOL 113 07/05/2021   HDL 49 07/05/2021   LDLCALC 34 07/05/2021   LDLDIRECT 96.5 05/14/2021   TRIG 190 (H) 07/05/2021   CHOLHDL 2.3 07/05/2021    Lab Results  Component Value Date   CHOL 113 07/05/2021   CHOL 175 05/15/2021   CHOL 163 02/12/2017   Lab Results  Component Value Date   HDL 49 07/05/2021   HDL 47 05/15/2021   HDL 64 02/12/2017   Lab Results  Component Value Date   LDLCALC 34 07/05/2021   LDLCALC 76 05/15/2021   LDLCALC 70 02/12/2017   Lab Results  Component Value Date   TRIG 190 (H)  07/05/2021   TRIG 258 (H) 05/15/2021   TRIG 144 02/12/2017   Lab Results  Component Value Date   CHOLHDL 2.3 07/05/2021   CHOLHDL 3.7 05/15/2021   CHOLHDL 2.5 02/12/2017      Medication review performed; medication list updated in electronic medical record.  Provider established cholesterol goals reviewed; Counseled on importance of regular laboratory monitoring as prescribed; Continued review of role and benefits of statin for ASCVD risk reduction; Continued review of importance of limiting foods high in cholesterol;    Hypertension Interventions: Goal on Track (progressing) Yes Last practice recorded BP readings:  BP Readings from Last 3 Encounters:  08/17/21 (!) 148/82   08/15/21 136/82  07/05/21 (!) 164/80    Most recent eGFR/CrCl:  Lab Results  Component Value Date   EGFR 97 07/05/2021    No components found for: CRCL  Evaluation of current treatment plan related to hypertension self management and patient's adherence to plan as established by provider; Reviewed medications with patient and discussed importance of compliance; Discussed plans with patient for ongoing care management follow up and provided patient with direct contact information for care management team; Advised patient, providing education and rationale, to monitor blood pressure daily and record, calling PCP for findings outside established parameters;  Reviewed scheduled/upcoming provider appointments including: [facility] Pharmacist visit in near future.  No Scheduled appointment at this time. Patient reports recent blood pressure reading of 148/76 and pulse 71/minute.  Patient states he missed taking his daily medication once this past week when his normal routine was thrown off by oversleeping.  He has good medication compliance overall.    Pain Interventions: Pain assessment performed Medications reviewed Reviewed provider established plan for pain management; Discussed importance of adherence to all scheduled medical appointments; Counseled on the importance of reporting any/all new or changed pain symptoms or management strategies to pain management provider; Advised patient to report to care team affect of pain on daily activities; Reviewed with patient prescribed pharmacological and nonpharmacological pain relief strategies; Discussed contacting PCP again regarding new order for pain medication.  Patient continues to take Tylenol 500 mg 2 tabs TID or QID for pain management of Left shoulder pain. He states that Tylenol does take the edge off of pain but the left shoulder pain remains even with use of Tylenol.  Patient is educated on the  maximum daily dose of Tylenol being 3000 mg and  adhering to that with poor to fair pain relief.  Patient would like x-ray of left shoulder since he states his left shoulder pain is very similar to right shoulder pain prior to required surgery on right shoulder in the past.     Patient Goals/Self-Care Activities: Patient will self administer medications as prescribed as evidenced by self report/primary caregiver report  Patient will attend all scheduled provider appointments as evidenced by clinician review of documented attendance to scheduled appointments and patient/caregiver report Patient will call pharmacy for medication refills as evidenced by patient report and review of pharmacy fill history as appropriate Patient will continue to perform ADL's independently as evidenced by patient/caregiver report Patient will continue to perform IADL's independently as evidenced by patient/caregiver report Patient will call provider office for new concerns or questions as evidenced by review of documented incoming telephone call notes and patient report check blood sugar at prescribed times: three times daily enter blood sugar readings and medication or insulin into daily log take the blood sugar log to all doctor visits - check blood pressure 3 times per  week - write blood pressure results in a log or diary - keep a blood pressure log - take blood pressure log to all doctor appointments - keep all doctor appointments - take medications for blood pressure exactly as prescribed - report new symptoms to your doctor - take all medications exactly as prescribed - call doctor with any symptoms you believe are related to your medicine - call doctor when you experience any new symptoms - go to all doctor appointments as scheduled

## 2021-10-17 ENCOUNTER — Telehealth: Payer: Self-pay

## 2021-10-17 NOTE — Patient Outreach (Signed)
Care Coordination  10/17/2021  [name] 06-Oct-1956 [id number]  This RN Care Manager placed a telephone call to the patient for follow up on a few items following last week telephone visit.    I informed the patient that his PCP has placed a referral for an Orthopedic Consult with [facility] to address his left shoulder pain. I instructed the patient to contact [facility] at [phone] to schedule an appointment for evaluation of left shoulder pain.  I informed patient that his PCP was made aware last week of the need for refill orders for 3 of his current medications: Flomax, Metoprolol (Lopressor), Chlorothiazide (Microzide).  Patient stated the pharmacy did not notify him that these medications have been refilled.  Patient plans to contact the pharmacy tomorrow to confirm if the medications have been refilled.  He will contact this RN Case Manager if the pharmacy has not received the PCP refill prescriptions.   We discussed the best way for patient to sign up for the My Chart.  This RN Care Manager offered to send the My Chart sign up link via text or e-mail and patient decided text was the best option for him to complete My Chart sign up.  I sent patient the My Chart sign up link via text message.  Patient verbalized understanding of all the above information.  If the patient has any further issues or concerns, he plans to contact me tomorrow.  [name], BSN Community Care Coordinator [place] [facility] Mobile: [phone]

## 2021-11-06 ENCOUNTER — Ambulatory Visit: Payer: Medicaid Other

## 2021-11-07 ENCOUNTER — Other Ambulatory Visit: Payer: Self-pay | Admitting: Nurse Practitioner

## 2021-11-08 ENCOUNTER — Other Ambulatory Visit: Payer: Self-pay

## 2021-11-08 NOTE — Patient Outreach (Signed)
Care Coordination  11/08/2021  MIKO SIRICO Apr 10, 1956 [id number]   This RN Care Manager had a successful telephone call to patient today.   Patient requested we reschedule this follow up visit to next week due to a death in the family that he is dealing with today.  I agreed to reschedule this visit to next Thursday 11/15/2021 at 11:00 AM.  Patient in agreement with this plan.  [name], BSN Community Care Coordinator [place] [facility] Mobile: [phone]

## 2021-11-08 NOTE — Patient Instructions (Signed)
[name] ,   The [facility] Managed Care Team is available to provide assistance to you with your healthcare needs at no cost and as a benefit of your [facility] Health plan.   This RN Care Manager had a successful telephone call to patient today.   Patient requested we reschedule this follow up visit to next week due to a death in the family that he is dealing with today.  I agreed to reschedule this visit to next Thursday 11/15/2021 at 11:00 AM.  Patient in agreement with this plan..   Thank you,   [name], BSN [facility] Coordinator [place] [facility] Mobile: [phone]

## 2021-11-14 ENCOUNTER — Ambulatory Visit (INDEPENDENT_AMBULATORY_CARE_PROVIDER_SITE_OTHER): Payer: Medicare Other | Admitting: Orthopaedic Surgery

## 2021-11-14 ENCOUNTER — Other Ambulatory Visit: Payer: Self-pay

## 2021-11-14 ENCOUNTER — Ambulatory Visit: Payer: Medicaid Other

## 2021-11-14 ENCOUNTER — Ambulatory Visit: Payer: Self-pay

## 2021-11-14 ENCOUNTER — Encounter: Payer: Self-pay | Admitting: Orthopaedic Surgery

## 2021-11-14 DIAGNOSIS — M25512 Pain in left shoulder: Secondary | ICD-10-CM | POA: Diagnosis not present

## 2021-11-14 DIAGNOSIS — M25511 Pain in right shoulder: Secondary | ICD-10-CM | POA: Diagnosis not present

## 2021-11-14 DIAGNOSIS — G8929 Other chronic pain: Secondary | ICD-10-CM | POA: Diagnosis not present

## 2021-11-14 NOTE — Progress Notes (Signed)
Office Visit Note   Patient: [name]           Date of Birth: January 16, 1956           MRN: [id number] Visit Date: 11/14/2021              Requested by: [name], NP [address] [place],  [place] [address] PCP: [name], NP   Assessment & Plan: Visit Diagnoses:  1. 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  At this point, I have discussed repeating cortisone injections versus referral to Dr. Marlou Sa for surgical consultation for shoulder replacement surgery.  Patient is currently not interested in repeat cortisone injections as these have caused episodes of vomiting in the past.  He would like to follow-up with Dr. Marlou Sa.  He will call us with any concerns or questions in the meantime.  Follow-Up Instructions: No follow-ups on file.   Orders:  Orders Placed This Encounter  Procedures   XR Shoulder Left   No orders of the defined types were placed in this encounter.     Procedures: No procedures performed   Clinical Data: No additional findings.   Subjective: Chief Complaint  Patient presents with   Right Shoulder - Pain   Left Shoulder - Pain    HPI patient is a pleasant 66 year old gentleman who comes in today with bilateral shoulder pain left greater than right.  He is status post right shoulder rotator cuff repair approximately 20 years ago.  He notes that his pain returned soon after.  No known injury or change in activity.  The pain has progressively worsened.  He is now having left shoulder pain which started more recently.  The pain he has to both shoulders is throughout the entire shoulder.  He has associated weakness.  Pain is worse with lifting anything to include his arm.  He also has pain sleeping on either side.  He has been taking Tylenol without relief.  He has had previous cortisone injections to both shoulders without relief.  He  also notes these injections make him feel sick.  He has been to physical therapy without relief.  Review of Systems as detailed in HPI.  All others reviewed and negative.   Objective: Vital Signs: There were no vitals taken for this visit.  Physical Exam well-developed well-nourished gentleman in no acute distress.  Alert and oriented x3.  Ortho Exam right shoulder exam reveals active forward flexion abduction to approximately 30 degrees.  I can passively get him to approximately 80 degrees.  Negative belly press.  He has a fair amount of weakness with resisted internal and external rotation.  He is neurovascular intact distally.  Left shoulder exam shows active forward flexion abduction to 60 degrees, I can passively get him to approximately 90 degrees.  Positive belly press.  He again has moderate weakness with resisted internal and external rotation.  He is neurovascular intact distally.  Specialty Comments:  No specialty comments available.  Imaging: XR Shoulder Left  Result Date: 11/14/2021 X-rays demonstrates advanced degenerative changes the glenohumeral and AC joints.  No superior migration of the humeral head.  Right shoulder x-rays reviewed by me in canopy show marked degenerative changes the glenohumeral and AC joints.  He does have slight superior migration of the humeral head.   PMFS History: Patient Active Problem List   Diagnosis Date Noted  Alcoholic intoxication without complication ([place]) 64/68/0321   Marijuana use 05/16/2021   CVA (cerebral vascular accident) ([place]) 05/14/2021   Alcohol abuse 05/14/2021   Tobacco abuse 05/14/2021   DM2 (diabetes mellitus, type 2) ([place]) 05/14/2021   Angioedema 02/09/2017   Primary localized osteoarthritis of right hip 01/02/2016   Groin rash 22/48/2500   DM w/o complication type II ([place]) 06/16/2013   BPH (benign prostatic hyperplasia) 11/24/2012   Healthcare maintenance 11/24/2012   Pancreatitis 10/31/2012   Chronic pain  syndrome 09/18/2011   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/19/2011   Right rotator cuff tear 07/26/2011   Shoulder pain, right 05/03/2011   Right ankle pain 01/23/2011   Left wrist pain 01/23/2011   Back pain 01/02/2011   MOLLUSCUM CONTAGIOSUM 12/05/2008   LEUKOCYTOSIS UNSPECIFIED 03/25/2008   LIVER FUNCTION TESTS, ABNORMAL 03/25/2008   ERECTILE DYSFUNCTION 05/12/2007   Essential hypertension 03/02/2007   Hypercholesteremia 01/08/2007   ANXIETY 01/08/2007   GERD 01/08/2007   Rheumatoid arthritis ([place]) 01/08/2007   OSTEOARTHRITIS 01/08/2007   Past Medical History:  Diagnosis Date   Alcohol abuse    Allergic rhinitis    Anxiety    With Post traumatic stress disorder   Diabetes mellitus without complication ([place])    'sometime last yr'--type 2   GERD (gastroesophageal reflux disease)    Headache    "like migraines"   Hepatitis    he thinks its hep b or c   Hyperlipidemia    Hypertension    Osteoarthritis    Pancreatitis    Rheumatoid arthritis(714.0)    Tobacco user     Family History  Problem Relation Age of Onset   Kidney disease Brother    Angioedema Maternal Aunt        Tongue swelling   Heart attack Mother    Heart attack Father     Past Surgical History:  Procedure Laterality Date   COLONOSCOPY     FRACTURE SURGERY     cheek bone fracture    rtc     right shoulder  -- 2010   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  Currently smoking 1 -2 cigarette per day and is doing "OK" with decrease.  Vaping Use    Vaping Use: Never used  Substance and Sexual Activity   Alcohol use: Yes    Alcohol/week: 7.0 - 14.0 standard drinks    Types: 7 - 14 Cans of beer per week    Comment: 1-2 beers occasionally   Drug use: No   Sexual activity: Yes

## 2021-11-15 ENCOUNTER — Other Ambulatory Visit: Payer: Self-pay

## 2021-11-15 NOTE — Patient Instructions (Signed)
[name] ,    [facility] has been made aware of a change in your Managed [employer] where as you are not actively enrolled in a Managed Medicaid plan with whom we are contracted.  Effective today, 11/15/2021, your [place] case will be closed.    The care management team is available to assist with your healthcare needs at any time. Please do not hesitate to contact me at the number below.  Thank you,  [name], BSN [facility] Coordinator [place] [facility] Mobile: [phone]

## 2021-11-26 ENCOUNTER — Other Ambulatory Visit: Payer: Self-pay | Admitting: Nurse Practitioner

## 2021-11-27 ENCOUNTER — Other Ambulatory Visit: Payer: Self-pay

## 2021-11-27 NOTE — Patient Outreach (Signed)
Medicaid Managed Care   Nurse Care Manager Note  11/27/2021 Name:  [name] MRN:  [id number] DOB:  1956-07-16  [name] is an 66 y.o. year old male who is a primary patient of [name], NP.  The [facility] Managed Care Coordination team was consulted for assistance with:    HTN HLD DMII Left Shoulder Pain  [name] was given information about Medicaid Managed Care Coordination team services today. [name] Patient agreed to services and verbal consent obtained.  Engaged with patient by telephone for follow up visit in response to provider referral for case management and/or care coordination services.   Assessments/Interventions:  Review of past medical history, allergies, medications, health status, including review of consultants reports, laboratory and other test data, was performed as part of comprehensive evaluation and provision of chronic care management services.  SDOH (Social Determinants of Health) assessments and interventions performed:   Care Plan  Allergies  Allergen Reactions   Lisinopril Other (See Comments)    angioedema   Citalopram Nausea Only   Morphine Nausea And Vomiting   Nsaids Other (See Comments)    Stomach Irritability    Paroxetine     Other reaction(s): NAUSEA,VOMITING    Medications Reviewed Today     Reviewed by [name], RN (Case Manager) on 11/27/21 at 78  Med List Status: <None>   Medication Order Taking? Sig Documenting Provider Last Dose Status Informant  amLODipine (NORVASC) 5 MG tablet [id number] No Take 1 tablet (5 mg total) by mouth daily. [name], NP Taking Active   atorvastatin (LIPITOR) 40 MG tablet [id number] No Take 1 tablet (40 mg total) by mouth daily. [name], NP Taking Active   blood glucose meter kit and supplies KIT [id number] No Dispense based on patient and insurance preference. Use up to four times daily as directed. [name], NP Taking Active   capsaicin  (ZOSTRIX) 0.025 % cream [id number]  APPLY SMALL AMOUNT TOPICALLY THREE TIMES A DAY AVOID GETTING ON FACE, IN EYES OR ON SENSITIVE AREAS. [place] HANDS THOROUGHLY AFTER USING. [provider]  Active   clopidogrel (PLAVIX) 75 MG tablet [id number] No Take 1 tablet (75 mg total) by mouth daily. [name], NP Taking Active   famotidine (PEPCID) 20 MG tablet [id number] No Take 1 tablet (20 mg total) by mouth 2 (two) times daily. Take one tablet by mouth two times a day [name], NP Taking Active   folic acid (FOLVITE) 1 MG tablet [id number] No Take 1 tablet (1 mg total) by mouth daily. [name], MD Taking Active            Med Note [facility] A   Thu Jun 14, 2021 11:03 AM) Patient does not have  gabapentin (NEURONTIN) 300 MG capsule [id number] No Take 1 capsule (300 mg total) by mouth 4 (four) times daily. Take three capsules by mouth three times a day for tingling/pain [name], NP Taking Active   hydrochlorothiazide (MICROZIDE) 12.5 MG capsule [id number] No Take 1 capsule (12.5 mg total) by mouth daily. [name], NP Taking Active   metFORMIN (GLUCOPHAGE) 500 MG tablet [id number] No Take 1 tablet (500 mg total) by mouth daily with breakfast. [name], NP Taking Active   metoprolol tartrate (LOPRESSOR) 50 MG tablet [id number] No Take 1 tablet (50 mg total) by mouth 2 (two) times daily. Take one two times a day for blood pressure. Dionisio David [place],  NP Taking Active   Multiple Vitamin (MULTIVITAMIN WITH MINERALS) TABS tablet [id number] No Take 1 tablet by mouth daily.  Patient not taking: Reported on 08/17/2021   [name], MD Not Taking Active            Med Note [facility], [name]   Thu Jun 14, 2021 11:03 AM) Patient does not have  nicotine polacrilex (NICORETTE) 4 MG gum [id number] No CHEW 1 PIECE OF GUM BY MOUTH AS DIRECTED USE AT LEAST 8-10 PIECES EACH DAY TO START. DO NOT USE MORE THAN 24 PIECES IN A DAY. DO NOT EAT OR DRINK FOR 15 MINUTES  BEFORE AND DURING USE  Patient not taking: Reported on 08/17/2021   [provider] Not Taking Active   tamsulosin (FLOMAX) 0.4 MG CAPS capsule [id number]  TAKE 2 CAPSULES BY MOUTH EVERY DAY [name], NP  Active   thiamine 100 MG tablet [id number] No Take 1 tablet (100 mg total) by mouth daily.  Patient not taking: Reported on 08/17/2021   [name], MD Not Taking Active            Med Note [facility], [name]   Thu Jun 14, 2021 11:03 AM) Patient does not have  traZODone (DESYREL) 100 MG tablet [id number] No Take 1 tablet (100 mg total) by mouth at bedtime. Take one table by mouth at bedtime as needed for sleep. [name], NP Taking Active             Patient Active Problem List   Diagnosis Date Noted   Alcoholic intoxication without complication ([place]) 24/40/1027   Marijuana use 05/16/2021   CVA (cerebral vascular accident) ([place]) 05/14/2021   Alcohol abuse 05/14/2021   Tobacco abuse 05/14/2021   DM2 (diabetes mellitus, type 2) ([place]) 05/14/2021   Angioedema 02/09/2017   Primary localized osteoarthritis of right hip 01/02/2016   Groin rash 25/36/6440   DM w/o complication type II ([place]) 06/16/2013   BPH (benign prostatic hyperplasia) 11/24/2012   Healthcare maintenance 11/24/2012   Pancreatitis 10/31/2012   Chronic pain syndrome 09/18/2011   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/19/2011   Right rotator cuff tear 07/26/2011   Shoulder pain, right 05/03/2011   Right ankle pain 01/23/2011   Left wrist pain 01/23/2011   Back pain 01/02/2011   MOLLUSCUM CONTAGIOSUM 12/05/2008   LEUKOCYTOSIS UNSPECIFIED 03/25/2008   LIVER FUNCTION TESTS, ABNORMAL 03/25/2008   ERECTILE DYSFUNCTION 05/12/2007   Essential hypertension 03/02/2007   Hypercholesteremia 01/08/2007   ANXIETY 01/08/2007   GERD 01/08/2007   Rheumatoid arthritis ([place]) 01/08/2007   OSTEOARTHRITIS 01/08/2007    Conditions to be addressed/monitored per PCP order:  HTN, HLD, DMII, and Left Shoulder Pain  Care  Plan : RN Care Manger Plan of Care  Updates made by [name], RN since 11/27/2021 12:00 AM     Problem: Chronic Disease Management and Care Coordination Needs for CVA, HTN, HLD, DM II, Chronic Pain.      Long-Range Goal: Development of Plan of Care for Chronic Disease Management and Care Coordination Needs (CVA, HTN, HLD, DM II, Chronic Pain   Start Date: 06/14/2021  Expected End Date: 01/24/2022  Priority: High  Note:   Current Barriers:  Knowledge Deficits related to plan of care for management of HTN, HLD, DMII, and  Chronic Left Shoulder Pain  Care Coordination needs related to Lacks knowledge of community resource: for different medicaid plans and what they offer especially dental care  Chronic Disease Management support and education needs related  to HTN, HLD, DMII, and  Chronic Left Shoulder Pain Non-adherence to prescribed medication regimen  RNCM Clinical Goal(s):  Patient will verbalize understanding of plan for management of HTN, HLD, DMII, and Chronic Left Shoulder Pain as evidenced by improved control of all noted diseases verbalize basic understanding of HTN, HLD, DMII, and Chronic Left Shoulder Pain disease process and self health management plan as evidenced by documented self monitoring activities of DM, HTN and chronic pain. take all medications exactly as prescribed and will call provider for medication related questions as evidenced by reported medication compliance.    attend all scheduled medical appointments: 11/28/2021 Dr. Marlou Sa - orthopedic surgeon; as evidenced by attending scheduled appointments        demonstrate ongoing adherence to prescribed treatment plan for HTN, HLD, DMII, and Chronic Left shoulder pain as evidenced by well controlled disease management of noted diseases. demonstrate ongoing health management independence as evidenced by well controlled disease management of noted diseases.        continue to work with Consulting civil engineer and/or Social  Worker to address care management and care coordination needs related to HTN, HLD, DMII, and Chronic Left Shoulder pain as evidenced by adherence to CM Team Scheduled appointments     work with pharmacist to address Medication procurement and complex medication regimen related to HTN, HLD, DMII, and Chronic Left Shoulder Pain as evidenced by review of EMR and patient or pharmacist report    work with Education officer, museum to address Lacks knowledge of community resource: for different medicaid plans and what they offer especially dental care  related to the management of HTN, HLD, DMII, and Chronic Left Shoulder pain as evidenced by review of EMR and patient or social worker report     through collaboration with Consulting civil engineer, provider, and care team.  BSW contacted patient on 09/18/21 and provided information regarding dental care and audiologist coverage.   Interventions: Inter-disciplinary care team collaboration (see longitudinal plan of care) Evaluation of current treatment plan related to  self management and patient's adherence to plan as established by provider 09/18/21:BSW contacted patient regarding dental and audiology resources. BSW researched and called and no one take Medicaid. BSW informed patient that the most affordable was at [place] located it [employer] and the start off at $199 for 2 aids. Patient stated that he did need dental resources, he stated he would like for the list of resources to mailed to him. BSW will have list mailed to patient.   Diabetes:  (Status: Goal on Track (progressing): YES.)   Lab Results  Component Value Date   HGBA1C 5.7 (A) 08/17/2021    Assessed patient's understanding of A1c goal: <7% Provided education to patient about basic DM disease process; Reviewed medications with patient and discussed importance of medication adherence;        Counseled on importance of regular laboratory monitoring as prescribed;        Discussed plans with patient for ongoing care  management follow up and provided patient with direct contact information for care management team;      Reviewed scheduled/upcoming provider appointments including: 11/28/2021 Dr.Dean - orthopedic surgeon.    Advised patient, providing education and rationale, to check blood sugar.  Patient states he is checking blood sugars once or twice a week. Patient states his blood sugars at home have been "real good".  Patient did not have recent blood sugar result to report.  Patient denies any low or high blood sugar readings.  Hyperlipidemia:  (Status: Goal on Track (progressing): YES.) Lab Results  Component Value Date   CHOL 113 07/05/2021   HDL 49 07/05/2021   LDLCALC 34 07/05/2021   LDLDIRECT 96.5 05/14/2021   TRIG 190 (H) 07/05/2021   CHOLHDL 2.3 07/05/2021    Lab Results  Component Value Date   LDLCALC 34 07/05/2021       Lab Results  Component Value Date   HDL 49 07/05/2021   HDL 47 05/15/2021   HDL 64 02/12/2017   Lab Results  Component Value Date   TRIG 190 (H) 07/05/2021   TRIG 258 (H) 05/15/2021   TRIG 144 02/12/2017  Medication review performed; medication list updated in electronic medical record.  Provider established cholesterol goals reviewed; Counseled on importance of regular laboratory monitoring as prescribed; Continued review of role and benefits of statin for ASCVD risk reduction; Continued review of importance of limiting foods high in cholesterol;    Hypertension Interventions: Goal on Track (progressing) Yes Last practice recorded BP readings:    BP Readings from Last 3 Encounters:  08/17/21 (!) 148/82  08/15/21 136/82  07/05/21 (!) 164/80    Most recent eGFR/CrCl:  Lab Results  Component Value Date   EGFR 97 07/05/2021    No components found for: CRCL  Evaluation of current treatment plan related to hypertension self management and patient's adherence to plan as established by provider; Reviewed medications with patient and discussed  importance of compliance; Discussed plans with patient for ongoing care management follow up and provided patient with direct contact information for care management team; Advised patient, providing education and rationale, to monitor blood pressure daily and record, calling PCP for findings outside established parameters;  Reviewed scheduled/upcoming provider appointments including: 11/28/2021 Dr Marlou Sa - orthopedic surgeon Patient reports recent blood pressure readings are "in normal range" lately.  Patient did not have recent blood pressure readings to report.  He has good medication compliance overall but has needs refills on Flomax and will need refills on all oral medications this week.  Pain Interventions: Pain assessment performed Medications reviewed Reviewed provider established plan for pain management; Discussed importance of adherence to all scheduled medical appointments; Counseled on the importance of reporting any/all new or changed pain symptoms or management strategies to pain management provider; Advised patient to report to care team affect of pain on daily activities; Reviewed with patient prescribed pharmacological and nonpharmacological pain relief strategies; Discussed plans for this RN Care Manager to contact PCP regarding new order for pain medication.  Patient continues to report Left Shoulder pain that is only temporarily relieved with use of take Tylenol 500 mg 2 tabs TID or QID.  Patient did see Dr. Erlinda Hong on 11/14/2021 for consulation for left shoulder pain.  He has new patient office visit with Dr. Marlou Sa tomorrow, 11/28/2021 for evaluation.  Patient is under the impression that Left shoulder replacement may be needed to relieve Left shoulder pain. .   Patient Goals/Self-Care Activities: Patient will self administer medications as prescribed as evidenced by self report/primary caregiver report  Patient will attend all scheduled provider appointments as evidenced by clinician  review of documented attendance to scheduled appointments and patient/caregiver report Patient will call pharmacy for medication refills as evidenced by patient report and review of pharmacy fill history as appropriate Patient will continue to perform ADL's independently as evidenced by patient/caregiver report Patient will continue to perform IADL's independently as evidenced by patient/caregiver report Patient will call provider office for new concerns or questions as evidenced  by review of documented incoming telephone call notes and patient report check blood sugar at prescribed times: three times daily enter blood sugar readings and medication or insulin into daily log take the blood sugar log to all doctor visits - check blood pressure 3 times per week - write blood pressure results in a log or diary - keep a blood pressure log - take blood pressure log to all doctor appointments - keep all doctor appointments - take medications for blood pressure exactly as prescribed - report new symptoms to your doctor - take all medications exactly as prescribed - call doctor with any symptoms you believe are related to your medicine - call doctor when you experience any new symptoms - go to all doctor appointments as scheduled       Follow Up:  Patient agrees to Care Plan and Follow-up.  Plan: The Managed Medicaid care management team will reach out to the patient again over the next 30 days.  Date/time of next scheduled RN care management/care coordination outreach:  12/25/2021 at 11:00 AM  [place], [place] [facility] Mobile: [phone]

## 2021-11-27 NOTE — Patient Instructions (Signed)
Visit Information  Mr. Read was given information about Medicaid Managed Care team care coordination services as a part of their [place] Medicaid benefit. [name] verbally consented to engagement with the [facility] Managed Care team.   If you are experiencing a medical emergency, please call 911 or report to your local emergency department or urgent care.   If you have a non-emergency medical problem during routine business hours, please contact your provider's office and ask to speak with a nurse.   For questions related to your [facility], please call: [phone] or visit the homepage here: [email or web address]  If you would like to schedule transportation through your [facility], please call the following number at least 2 days in advance of your appointment: [phone].   Call the [facility] at [phone], at any time, 24 hours a day, 7 days a week. If you are in danger or need immediate medical attention call 911.  If you would like help to quit smoking, call 1-800-QUIT-NOW 313-709-5735) OR Espaol: 1-855-Djelo-Ya (4-132-440-1027) o para ms informacin haga clic aqu or Text READY to 200-400 to register via text  Mr. Garcialopez - following are the goals we discussed in your visit today:   Goals Addressed             This Visit's Progress    Blood Pressure < 140/90       Timeframe:  Long-Range Goal Priority:  High Start Date: 06/13/2021                       Expected End Date:   01/24/2022    Patient will self administer medications as prescribed Patient will attend all scheduled provider appointments Patient will call pharmacy for medication refills   Patient will continue to perform ADL's independently as evidenced by patient/caregiver report Patient will continue to perform IADL's independently as evidenced by  patient/caregiver report - check blood pressure 3 times per week - write blood pressure results in a log or diary - keep a blood pressure log - take blood pressure log to all doctor appointments - take medications for blood pressure exactly as prescribed - report new symptoms to your doctor - take all medications exactly as prescribed - call doctor with any symptoms you believe are related to your medicine - call doctor when you experience any new symptoms     HLD Management       Timeframe:  Long-Range Goal Priority:  High Start Date: 06/14/2021       Expected End Date:  01/24/2022                  Patient will self administer medications as prescribed Patient will attend all scheduled provider appointments Patient will call pharmacy for medication refills        Patient will continue to perform ADL's independently as evidenced by patient/caregiver report Patient will continue to perform IADL's independently as evidenced by patient/caregiver report Patient will call provider office for new concerns or questions as evidenced by review of documented incoming telephone call notes and patient report - keep all doctor appointments - take medications for blood pressure exactly as prescribed - report new symptoms to your doctor - take all medications exactly as prescribed - call doctor with any symptoms you believe are related to your medicine - call doctor when you experience any new symptoms - go to all doctor appointments as scheduled  RNCM Blood sugar monitoring and management       Timeframe:  Long-Range Goal Priority:  High Start Date:  06/14/2021                       Expected End Date:  01/24/2022            Patient will self administer medications as prescribed as evidenced by self report/primary caregiver report  Patient will attend all scheduled provider appointments as evidenced by clinician review of documented attendance to scheduled appointments and  patient/caregiver report Patient will call pharmacy for medication refills as evidenced by patient report and review of pharmacy fill history as appropriate Patient will continue to perform ADL's independently as evidenced by patient/caregiver report Patient will continue to perform IADL's independently as evidenced by patient/caregiver report Patient will call provider office for new concerns or questions as evidenced by review of documented incoming telephone call notes and patient report check blood sugar at prescribed times: three times daily enter blood sugar readings and medication or insulin into daily log take the blood sugar log to all doctor visits - keep all doctor appointments - take medications for blood pressure exactly as prescribed - report new symptoms to your doctor - take all medications exactly as prescribed - call doctor with any symptoms you believe are related to your medicine - call doctor when you experience any new symptoms - go to all doctor appointments as scheduled        Please see education materials related to today's visit provided as print materials.   The patient verbalized understanding of instructions provided today and agreed to receive a mailed copy of patient instruction and/or educational materials.  The Managed Medicaid care management team will reach out to the patient again over the next 30 days.   [name], BSN Community Care Coordinator [facility] Mobile: [phone]   Following is a copy of your plan of care:  Care Plan : [place] of Care  Updates made by [name], RN since 11/27/2021 12:00 AM     Problem: Chronic Disease Management and Care Coordination Needs for CVA, HTN, HLD, DM II, Chronic Pain.      Long-Range Goal: Development of Plan of Care for Chronic Disease Management and Care Coordination Needs (CVA, HTN, HLD, DM II, Chronic Pain   Start Date: 06/14/2021   Expected End Date: 01/24/2022  Priority: High  Note:   Current Barriers:  Knowledge Deficits related to plan of care for management of HTN, HLD, DMII, and  Chronic Left Shoulder Pain  Care Coordination needs related to Lacks knowledge of community resource: for different medicaid plans and what they offer especially dental care  Chronic Disease Management support and education needs related to HTN, HLD, DMII, and  Chronic Left Shoulder Pain Non-adherence to prescribed medication regimen  RNCM Clinical Goal(s):  Patient will verbalize understanding of plan for management of HTN, HLD, DMII, and Chronic Left Shoulder Pain as evidenced by improved control of all noted diseases verbalize basic understanding of HTN, HLD, DMII, and Chronic Left Shoulder Pain disease process and self health management plan as evidenced by documented self monitoring activities of DM, HTN and chronic pain. take all medications exactly as prescribed and will call provider for medication related questions as evidenced by reported medication compliance.    attend all scheduled medical appointments: 11/28/2021 Dr. Marlou Sa - orthopedic surgeon; as evidenced by attending scheduled appointments  demonstrate ongoing adherence to prescribed treatment plan for HTN, HLD, DMII, and Chronic Left shoulder pain as evidenced by well controlled disease management of noted diseases. demonstrate ongoing health management independence as evidenced by well controlled disease management of noted diseases.        continue to work with Consulting civil engineer and/or Social Worker to address care management and care coordination needs related to HTN, HLD, DMII, and Chronic Left Shoulder pain as evidenced by adherence to CM Team Scheduled appointments     work with pharmacist to address Medication procurement and complex medication regimen related to HTN, HLD, DMII, and Chronic Left Shoulder Pain as evidenced by review of EMR and patient or pharmacist  report    work with Education officer, museum to address Lacks knowledge of community resource: for different medicaid plans and what they offer especially dental care  related to the management of HTN, HLD, DMII, and Chronic Left Shoulder pain as evidenced by review of EMR and patient or social worker report     through collaboration with Consulting civil engineer, provider, and care team.  BSW contacted patient on 09/18/21 and provided information regarding dental care and audiologist coverage.   Interventions: Inter-disciplinary care team collaboration (see longitudinal plan of care) Evaluation of current treatment plan related to  self management and patient's adherence to plan as established by provider 09/18/21:BSW contacted patient regarding dental and audiology resources. BSW researched and called and no one take Medicaid. BSW informed patient that the most affordable was at [place] located it [employer] and the start off at $199 for 2 aids. Patient stated that he did need dental resources, he stated he would like for the list of resources to mailed to him. BSW will have list mailed to patient.   Diabetes:  (Status: Goal on Track (progressing): YES.)   Lab Results  Component Value Date   HGBA1C 5.7 (A) 08/17/2021    Assessed patient's understanding of A1c goal: <7% Provided education to patient about basic DM disease process; Reviewed medications with patient and discussed importance of medication adherence;        Counseled on importance of regular laboratory monitoring as prescribed;        Discussed plans with patient for ongoing care management follow up and provided patient with direct contact information for care management team;      Reviewed scheduled/upcoming provider appointments including: 11/28/2021 Dr.Dean - orthopedic surgeon.    Advised patient, providing education and rationale, to check blood sugar.  Patient states he is checking blood sugars once or twice a week. Patient states his blood sugars at  home have been "real good".  Patient did not have recent blood sugar result to report.  Patient denies any low or high blood sugar readings.                Hyperlipidemia:  (Status: Goal on Track (progressing): YES.) Lab Results  Component Value Date   CHOL 113 07/05/2021   HDL 49 07/05/2021   LDLCALC 34 07/05/2021   LDLDIRECT 96.5 05/14/2021   TRIG 190 (H) 07/05/2021   CHOLHDL 2.3 07/05/2021    Lab Results  Component Value Date   LDLCALC 34 07/05/2021       Lab Results  Component Value Date   HDL 49 07/05/2021   HDL 47 05/15/2021   HDL 64 02/12/2017   Lab Results  Component Value Date   TRIG 190 (H) 07/05/2021   TRIG 258 (H) 05/15/2021   TRIG 144 02/12/2017  Medication  review performed; medication list updated in electronic medical record.  Provider established cholesterol goals reviewed; Counseled on importance of regular laboratory monitoring as prescribed; Continued review of role and benefits of statin for ASCVD risk reduction; Continued review of importance of limiting foods high in cholesterol;    Hypertension Interventions: Goal on Track (progressing) Yes Last practice recorded BP readings:    BP Readings from Last 3 Encounters:  08/17/21 (!) 148/82  08/15/21 136/82  07/05/21 (!) 164/80    Most recent eGFR/CrCl:  Lab Results  Component Value Date   EGFR 97 07/05/2021    No components found for: CRCL  Evaluation of current treatment plan related to hypertension self management and patient's adherence to plan as established by provider; Reviewed medications with patient and discussed importance of compliance; Discussed plans with patient for ongoing care management follow up and provided patient with direct contact information for care management team; Advised patient, providing education and rationale, to monitor blood pressure daily and record, calling PCP for findings outside established parameters;  Reviewed scheduled/upcoming provider appointments  including: 11/28/2021 Dr Marlou Sa - orthopedic surgeon Patient reports recent blood pressure readings are "in normal range" lately.  Patient did not have recent blood pressure readings to report.  He has good medication compliance overall but has needs refills on Flomax and will need refills on all oral medications this week.  Pain Interventions: Pain assessment performed Medications reviewed Reviewed provider established plan for pain management; Discussed importance of adherence to all scheduled medical appointments; Counseled on the importance of reporting any/all new or changed pain symptoms or management strategies to pain management provider; Advised patient to report to care team affect of pain on daily activities; Reviewed with patient prescribed pharmacological and nonpharmacological pain relief strategies; Discussed plans for this RN Care Manager to contact PCP regarding new order for pain medication.  Patient continues to report Left Shoulder pain that is only temporarily relieved with use of take Tylenol 500 mg 2 tabs TID or QID.  Patient did see Dr. Erlinda Hong on 11/14/2021 for consulation for left shoulder pain.  He has new patient office visit with Dr. Marlou Sa tomorrow, 11/28/2021 for evaluation.  Patient is under the impression that Left shoulder replacement may be needed to relieve Left shoulder pain. .   Patient Goals/Self-Care Activities: Patient will self administer medications as prescribed as evidenced by self report/primary caregiver report  Patient will attend all scheduled provider appointments as evidenced by clinician review of documented attendance to scheduled appointments and patient/caregiver report Patient will call pharmacy for medication refills as evidenced by patient report and review of pharmacy fill history as appropriate Patient will continue to perform ADL's independently as evidenced by patient/caregiver report Patient will continue to perform IADL's independently as evidenced  by patient/caregiver report Patient will call provider office for new concerns or questions as evidenced by review of documented incoming telephone call notes and patient report check blood sugar at prescribed times: three times daily enter blood sugar readings and medication or insulin into daily log take the blood sugar log to all doctor visits - check blood pressure 3 times per week - write blood pressure results in a log or diary - keep a blood pressure log - take blood pressure log to all doctor appointments - keep all doctor appointments - take medications for blood pressure exactly as prescribed - report new symptoms to your doctor - take all medications exactly as prescribed - call doctor with any symptoms you believe are related to your  medicine - call doctor when you experience any new symptoms - go to all doctor appointments as scheduled

## 2021-11-28 ENCOUNTER — Other Ambulatory Visit: Payer: Self-pay

## 2021-11-28 ENCOUNTER — Telehealth: Payer: Self-pay

## 2021-11-28 ENCOUNTER — Ambulatory Visit (INDEPENDENT_AMBULATORY_CARE_PROVIDER_SITE_OTHER): Payer: Medicare Other | Admitting: Orthopedic Surgery

## 2021-11-28 ENCOUNTER — Encounter: Payer: Self-pay | Admitting: Orthopedic Surgery

## 2021-11-28 DIAGNOSIS — M25512 Pain in left shoulder: Secondary | ICD-10-CM

## 2021-11-28 NOTE — Progress Notes (Signed)
Office Visit Note   Patient: [name]           Date of Birth: December 10, 1955           MRN: [id number] Visit Date: 11/28/2021 Requested by: [name], NP [address] [place],  [place] [address] PCP: [name], NP  Subjective: Chief Complaint  Patient presents with   Left Shoulder - Pain    HPI: [name] is a 66 year old patient with left shoulder pain.  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  He is an occasional smoker.  Has hypertension but his diabetes is well controlled.  He has daily pain as well as pain which interferes with his sleep nightly.  Has a daughter who checks on him who is in college.  She does live here in town.  He reports significant disability pain and weakness in that left shoulder.  Has a history of rotator cuff surgery x2 on the right-hand side.              ROS: All systems reviewed are negative as they relate to the chief complaint within the history of present illness.  Patient denies  fevers or chills.   Assessment & Plan: Visit Diagnoses:  1. Left shoulder pain, unspecified chronicity     Plan: Impression is left shoulder arthritis with probable component of rotator cuff pathology present based on weakness and limitation of active forward flexion and abduction.  We talked a lot today about the risk and benefits of operative treatment which for him would be shoulder replacement.  The risk including but not limited to infection nerve vessel damage dislocation as well as incomplete pain relief as well as need for more surgery are all discussed.  Plan thin cut CT scan left shoulder.  Expected rehabilitation effort and time and schedule also discussed.  He would like to go ahead and get scheduled for surgical intervention.  We would likely do that in approximately 4 to 5 weeks based on when we can get the CT scan done in the preop plan made.  All questions answered  Follow-Up Instructions: No  follow-ups on file.   Orders:  Orders Placed This Encounter  Procedures   CT SHOULDER LEFT WO CONTRAST   No orders of the defined types were placed in this encounter.     Procedures: No procedures performed   Clinical Data: No additional findings.  Objective: Vital Signs: There were no vitals taken for this visit.  Physical Exam:   Constitutional: Patient appears well-developed HEENT:  Head: Normocephalic Eyes:EOM are normal Neck: Normal range of motion Cardiovascular: Normal rate Pulmonary/chest: Effort normal Neurologic: Patient is alert Skin: Skin is warm Psychiatric: Patient has normal mood and affect   Ortho Exam: Ortho exam demonstrates good cervical spine range of motion.  5 out of 5 grip EPL FPL interosseous restriction extension biceps triceps and deltoid strength.  Does have some rotator cuff weakness to external rotation on the left and right.  Subscap testing 5+ out of 5 on the left-hand side.  Active forward flexion is about 80 degrees active abduction is about 80 degrees.  Passive range of motion is 70/100/170 on the left.  Deltoid fires.  No masses lymphadenopathy or skin changes noted in that left shoulder girdle region.  No Popeye deformity in that left arm.  Specialty Comments:  No specialty comments available.  Imaging: No results found.   PMFS History: Patient  Active Problem List   Diagnosis Date Noted   Alcoholic intoxication without complication ([place]) 29/93/7169   Marijuana use 05/16/2021   CVA (cerebral vascular accident) ([place]) 05/14/2021   Alcohol abuse 05/14/2021   Tobacco abuse 05/14/2021   DM2 (diabetes mellitus, type 2) ([place]) 05/14/2021   Angioedema 02/09/2017   Primary localized osteoarthritis of right hip 01/02/2016   Groin rash 67/89/3810   DM w/o complication type II ([place]) 06/16/2013   BPH (benign prostatic hyperplasia) 11/24/2012   Healthcare maintenance 11/24/2012   Pancreatitis 10/31/2012   Chronic pain syndrome 09/18/2011    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/19/2011   Right rotator cuff tear 07/26/2011   Shoulder pain, right 05/03/2011   Right ankle pain 01/23/2011   Left wrist pain 01/23/2011   Back pain 01/02/2011   MOLLUSCUM CONTAGIOSUM 12/05/2008   LEUKOCYTOSIS UNSPECIFIED 03/25/2008   LIVER FUNCTION TESTS, ABNORMAL 03/25/2008   ERECTILE DYSFUNCTION 05/12/2007   Essential hypertension 03/02/2007   Hypercholesteremia 01/08/2007   ANXIETY 01/08/2007   GERD 01/08/2007   Rheumatoid arthritis ([place]) 01/08/2007   OSTEOARTHRITIS 01/08/2007   Past Medical History:  Diagnosis Date   Alcohol abuse    Allergic rhinitis    Anxiety    With Post traumatic stress disorder   Diabetes mellitus without complication ([place])    'sometime last yr'--type 2   GERD (gastroesophageal reflux disease)    Headache    "like migraines"   Hepatitis    he thinks its hep b or c   Hyperlipidemia    Hypertension    Osteoarthritis    Pancreatitis    Rheumatoid arthritis(714.0)    Tobacco user     Family History  Problem Relation Age of Onset   Kidney disease Brother    Angioedema Maternal Aunt        Tongue swelling   Heart attack Mother    Heart attack Father     Past Surgical History:  Procedure Laterality Date   COLONOSCOPY     FRACTURE SURGERY     cheek bone fracture    rtc     right shoulder  -- 2010   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   Procedure: TOTAL HIP ARTHROPLASTY ANTERIOR APPROACH;  Surgeon: Renette Butters, MD;  Location: [facility];  Service: Orthopedics;  Laterality: Right;   WRIST SURGERY     fusion with pins  2007   Social History   Occupational History   Not on file  Tobacco Use   Smoking status: Every Day    Packs/day: 0.40    Years: 25.00    Pack years: 10.00    Types: Cigarettes    Passive exposure: Current   Smokeless tobacco: Never   Tobacco comments:    Trying to quit smoking.  Currently smoking 1 -2 cigarette per day and is doing "OK" with decrease.  Vaping Use   Vaping Use: Never used   Substance and Sexual Activity   Alcohol use: Yes    Alcohol/week: 7.0 - 14.0 standard drinks    Types: 7 - 14 Cans of beer per week    Comment: 1-2 beers occasionally   Drug use: No   Sexual activity: Yes

## 2021-11-29 ENCOUNTER — Telehealth: Payer: Self-pay

## 2021-11-29 NOTE — Patient Instructions (Signed)
[name] ,   The [facility] Managed Care Team is available to provide assistance to you with your healthcare needs at no cost and as a benefit of your [facility] Health plan.  This RN Care Manager made a successful a telephone contact with patient today to follow up regarding patient's request for pain medication for left shoulder pain and medication refills.     Plan to send message to patient's PCP, [name], to address patient's need for pain medication prescription until patient has left shoulder replacement surgery in 5 to 6 weeks.  Patient is in agreement with this plan.  Thank you,  [name], BSN [facility] Coordinator [facility] Mobile: [phone]

## 2021-11-29 NOTE — Patient Outreach (Signed)
Care Coordination  11/29/2021  [name] Feb 26, 1956 [id number]  This RN Care Manager made a successful a telephone contact with patient today to follow up regarding patient's request for pain medication for left shoulder pain and medication refills.    Patient attended scheduled appointment with orthopedic surgeon yesterday.  Patient states the plan discussed with orthopedic surgeon is for left shoulder replacement surgery in 5 to 6 weeks from now.  Patient asked orthopedic surgeon for pain medication prescription at yesterday's appointment and this MD directed patient back to PCP for pain medication management.  Patient stated he did ask for a pain medication prescription at orthopedic consultation appointment on 11/14/2021 but did not receive one.  Plan to send message to patient's PCP, Brian David NP, to address patient's need for pain medication prescription until patient has left shoulder replacement surgery in 5 to 6 weeks.  I informed patient that medication refill for Flomax was placed on 11/27/2021 and that he has refills available for all other medications.  Patient verbalized understanding.  Brian Marvel RN, BSN Community Care Coordinator [facility] Mobile: [phone]

## 2021-11-30 NOTE — Telephone Encounter (Signed)
Spoke to the patient and he stated that his orthopedic want prescribe him any pain medication for his shoulder. Pt states that his orthopedic doctor told him that his PCP has to control his pain and then once his surgery is done then he will take over.

## 2021-12-04 NOTE — Telephone Encounter (Signed)
Pt is aware.  

## 2021-12-05 ENCOUNTER — Encounter: Payer: Self-pay | Admitting: Nurse Practitioner

## 2021-12-05 ENCOUNTER — Ambulatory Visit (INDEPENDENT_AMBULATORY_CARE_PROVIDER_SITE_OTHER): Payer: Medicare Other | Admitting: Nurse Practitioner

## 2021-12-05 ENCOUNTER — Other Ambulatory Visit: Payer: Self-pay

## 2021-12-05 VITALS — BP 136/77 | HR 65 | Temp 97.9°F | Ht 67.0 in | Wt 152.0 lb

## 2021-12-05 DIAGNOSIS — F10920 Alcohol use, unspecified with intoxication, uncomplicated: Secondary | ICD-10-CM

## 2021-12-05 DIAGNOSIS — E118 Type 2 diabetes mellitus with unspecified complications: Secondary | ICD-10-CM

## 2021-12-05 DIAGNOSIS — M05712 Rheumatoid arthritis with rheumatoid factor of left shoulder without organ or systems involvement: Secondary | ICD-10-CM | POA: Diagnosis not present

## 2021-12-05 LAB — POCT GLYCOSYLATED HEMOGLOBIN (HGB A1C)
HbA1c POC (<> result, manual entry): 6.2 % (ref 4.0–5.6)
HbA1c, POC (controlled diabetic range): 6.2 % (ref 0.0–7.0)
HbA1c, POC (prediabetic range): 6.2 % (ref 5.7–6.4)
Hemoglobin A1C: 6.2 % — AB (ref 4.0–5.6)

## 2021-12-05 MED ORDER — ACETAMINOPHEN-CODEINE 300-30 MG PO TABS: 1.0000 | ORAL_TABLET | ORAL | 0 refills | Status: DC | PRN

## 2021-12-05 NOTE — Patient Instructions (Signed)
Rheumatoid Arthritis Rheumatoid arthritis (RA) is a long-term (chronic) disease. RA causes inflammation in your joints. Your joints may feel painful, stiff, swollen, and warm. RA may start slowly. It most often affects the small joints of the hands and feet. It can also affect other parts of the body. Symptoms of RA often come and go. There is no cure for RA, but medicines can help your symptoms. What are the causes? RA is an autoimmune disease. This means that your body's defense system (immune system) attacks healthy parts of your body by mistake. The exact cause of RA is not known. What increases the risk? Being a woman. Having a family history of RA or other diseases like RA. Smoking. Being overweight. Being exposed to pollutants or chemicals. What are the signs or symptoms? Morning stiffness that lasts longer than 30 minutes. This is often the first symptom. Symptoms start slowly. They are often worse in the morning. As RA gets worse, symptoms may include: Pain, stiffness, swelling, warmth, and tenderness in joints on both sides of your body. Loss of energy. Not feeling hungry. Weight loss. A low fever. Dry eyes and a dry mouth. Firm lumps that grow under your skin. Changes in the way your joints look. Changes in the way your joints work. Symptoms vary and they: Often come and go. Sometimes get worse for a period of time. These are called flares. How is this treated?  Treatment may include: Taking good care of yourself. Be sure to rest as needed, eat a healthy diet, and exercise. Medicines. These may include: Pain relievers. Medicines to help with inflammation. Disease-modifying antirheumatic drugs (DMARDs). Medicines called biologic response modifiers. Physical therapy and occupational therapy. Surgery, if joint damage is very bad. Your doctor will work with you to find the best treatments. Follow these instructions at home: Activity Return to your normal activities as  told by your doctor. Ask your doctor what activities are safe for you. Rest when you have a flare. Exercise as told by your doctor. General instructions Take over-the-counter and prescription medicines only as told by your doctor. Keep all follow-up visits as told by your doctor. This is important. Where to find more information [employer] [facility] [employer]: www.rheumatology.[place]: www.arthritis.org Contact a doctor if: You have a flare. You have a fever. You have problems because of your medicines. Get help right away if: You have chest pain. You have trouble breathing. You get a hot, painful joint all of a sudden, and it is worse than your normal joint aches. Summary RA is a long-term disease. Symptoms of RA start slowly. They are often worse in the morning. RA causes inflammation in your joints. This information is not intended to replace advice given to you by your health care provider. Make sure you discuss any questions you have with your health care provider. Document Revised: 06/17/2018 Document Reviewed: 06/17/2018 Elsevier Patient Education  2022 [employer].

## 2021-12-05 NOTE — Progress Notes (Signed)
[place], [place]  [address] Phone:  [phone]   Fax:  [phone]   Established Patient Office Visit  Subjective:  Patient ID: [name], male    DOB: 14-Nov-1955  Age: 66 y.o. MRN: [id number]  CC:  Chief Complaint  Patient presents with   Follow-up    Pt stated he is has pain in his left shoulder and hip also he is schedule for shoulder replacement surgery pt wanted advise. Pt stated he needs refill on all his medications    HPI [name] presents for follow up. He  has a past medical history of Alcohol abuse, Allergic rhinitis, Anxiety, Diabetes mellitus without complication ([place]), GERD (gastroesophageal reflux disease), Headache, Hepatitis, Hyperlipidemia, Hypertension, Osteoarthritis, Pancreatitis, Rheumatoid arthritis(714.0), and Tobacco user.   He is in today for a follow up. He does continue to see ortho @ [facility] . He is scheduled for an CT scan of his shoulder on 12/25/21. He is concern about surgery. He has been having increased pain. He has requested pain medication for the severity. His pain is a 9.5/10.  He reports only smoking 203 cigarettes per day.   He is in today for a follow up. He is compliant with his medication with the assistance. His major concern is the shoulder pain.   Denies headache, dizziness, visual changes, shortness of breath, dyspnea on exertion, chest pain, nausea, vomiting or any edema.   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   Procedure: TOTAL HIP ARTHROPLASTY ANTERIOR APPROACH;  Surgeon: Renette Butters, MD;  Location: [facility];  Service: Orthopedics;  Laterality: Right;   WRIST SURGERY     fusion with pins  2007    Family History  Problem Relation Age of Onset   Kidney disease Brother    Angioedema Maternal Aunt        Tongue swelling   Heart attack Mother    Heart attack Father     Social History   Socioeconomic History   Marital status: Single    Spouse name: Not on file   Number of children: Not on file   Years of education: Not on file   Highest education level: Not on file  Occupational History   Not on file  Tobacco Use   Smoking status: Every Day    Packs/day: 0.40    Years: 25.00    Pack years: 10.00    Types: Cigarettes    Passive exposure: Current   Smokeless tobacco: Never   Tobacco comments:    Trying to quit smoking.  Currently smoking 1 -2 cigarette per day and is doing "OK" with decrease.  Vaping Use   Vaping  Use: Never used  Substance and Sexual Activity   Alcohol use: Yes    Alcohol/week: 7.0 - 14.0 standard drinks    Types: 7 - 14 Cans of beer per week    Comment: 1-2 beers occasionally   Drug use: No   Sexual activity: Yes  Other Topics Concern   Not on file  Social History Narrative   Sharyon Cable. Married- 24 male sexual partner.   Denies drug use but has hx of crack cocaine, alcohol and tobacco use.   Social Determinants of Health   Financial Resource Strain: Low Risk    Difficulty of Paying Living Expenses: Not very hard  Food Insecurity: Food Insecurity Present   Worried About [place] in the Last Year: Sometimes true   Ran Out of Food in the Last Year: Sometimes true  Transportation Needs: No Transportation Needs   Lack of Transportation (Medical): No   Lack of Transportation (Non-Medical): No  Physical  Activity: Not on file  Stress: No Stress Concern Present   Feeling of Stress : Only a little  Social Connections: Unknown   Frequency of Communication with Friends and Family: More than three times a week   Frequency of Social Gatherings with Friends and Family: More than three times a week   Attends Religious Services: Not on Electrical engineer or Organizations: Not on file   Attends Archivist Meetings: Not on file   Marital Status: Not on file  Intimate Partner Violence: Not on file    Outpatient Medications Prior to Visit  Medication Sig Dispense Refill   amLODipine (NORVASC) 5 MG tablet Take 1 tablet (5 mg total) by mouth daily. 90 tablet 3   atorvastatin (LIPITOR) 40 MG tablet Take 1 tablet (40 mg total) by mouth daily. 90 tablet 3   blood glucose meter kit and supplies KIT Dispense based on patient and insurance preference. Use up to four times daily as directed. 1 each 0   capsaicin (ZOSTRIX) 0.025 % cream APPLY SMALL AMOUNT TOPICALLY THREE TIMES A DAY AVOID GETTING ON FACE, IN EYES OR ON SENSITIVE AREAS. [place] HANDS THOROUGHLY AFTER USING.     clopidogrel (PLAVIX) 75 MG tablet Take 1 tablet (75 mg total) by mouth daily. 90 tablet 3   famotidine (PEPCID) 20 MG tablet Take 1 tablet (20 mg total) by mouth 2 (two) times daily. Take one tablet by mouth two times a day 180 tablet 3   gabapentin (NEURONTIN) 300 MG capsule Take 1 capsule (300 mg total) by mouth 4 (four) times daily. Take three capsules by mouth three times a day for tingling/pain 360 capsule 3   hydrochlorothiazide (MICROZIDE) 12.5 MG capsule Take 1 capsule (12.5 mg total) by mouth daily. 90 capsule 3   metFORMIN (GLUCOPHAGE) 500 MG tablet Take 1 tablet (500 mg total) by mouth daily with breakfast. 90 tablet 3   metoprolol tartrate (LOPRESSOR) 50 MG tablet Take 1 tablet (50 mg total) by mouth 2 (two) times daily. Take one two times a day for blood pressure. 180 tablet 3   tamsulosin (FLOMAX) 0.4 MG CAPS  capsule TAKE 2 CAPSULES BY MOUTH EVERY DAY 180 capsule 1   traZODone (DESYREL) 100 MG tablet Take 1 tablet (100 mg total) by mouth at bedtime. Take one table by mouth at bedtime as needed for sleep. 90 tablet 3   folic acid (FOLVITE) 1 MG tablet Take 1 tablet (1 mg total) by mouth daily. (Patient not taking: Reported on  12/05/2021)     Multiple Vitamin (MULTIVITAMIN WITH MINERALS) TABS tablet Take 1 tablet by mouth daily. (Patient not taking: Reported on 08/17/2021)     nicotine polacrilex (NICORETTE) 4 MG gum CHEW 1 PIECE OF GUM BY MOUTH AS DIRECTED USE AT LEAST 8-10 PIECES EACH DAY TO START. DO NOT USE MORE THAN 24 PIECES IN A DAY. DO NOT EAT OR DRINK FOR 15 MINUTES BEFORE AND DURING USE (Patient not taking: Reported on 08/17/2021)     thiamine 100 MG tablet Take 1 tablet (100 mg total) by mouth daily. (Patient not taking: Reported on 08/17/2021)     No facility-administered medications prior to visit.    Allergies  Allergen Reactions   Lisinopril Other (See Comments)    angioedema   Citalopram Nausea Only   Morphine Nausea And Vomiting   Nsaids Other (See Comments)    Stomach Irritability    Paroxetine     Other reaction(s): NAUSEA,VOMITING    ROS Review of Systems    Objective:    Physical Exam Constitutional:      General: He is not in acute distress.    Appearance: He is normal weight.  HENT:     Head: Normocephalic and atraumatic.     Nose: Nose normal.     Mouth/Throat:     Mouth: Mucous membranes are moist.  Cardiovascular:     Rate and Rhythm: Normal rate and regular rhythm.     Pulses: Normal pulses.     Heart sounds: Normal heart sounds.  Pulmonary:     Effort: Pulmonary effort is normal.     Comments: Diminished in the bases  Abdominal:     General: Bowel sounds are normal.     Palpations: Abdomen is soft.  Musculoskeletal:        General: Tenderness (shoulder) present.     Cervical back: Normal range of motion.     Right lower leg: No edema.     Left  lower leg: No edema.  Feet:     Right foot:     Protective Sensation: 10 sites tested.  10 sites sensed.     Left foot:     Protective Sensation: 10 sites tested.  10 sites sensed.  Skin:    General: Skin is warm and dry.     Capillary Refill: Capillary refill takes less than 2 seconds.  Neurological:     General: No focal deficit present.     Mental Status: He is alert and oriented to person, place, and time.    BP 136/77    Pulse 65    Temp 97.9 F (36.6 C)    Ht _0  (1.702 m)    Wt 152 lb (68.9 kg)    SpO2 98%    BMI 23.81 kg/m  Wt Readings from Last 3 Encounters:  12/05/21 152 lb (68.9 kg)  08/17/21 155 lb 0.2 oz (70.3 kg)  08/15/21 151 lb 4 oz (68.6 kg)     Health Maintenance Due  Topic Date Due   OPHTHALMOLOGY EXAM  11/15/2021    There are no preventive care reminders to display for this patient.  No results found for: TSH Lab Results  Component Value Date   WBC 10.1 05/15/2021   HGB 16.7 05/15/2021   HCT 47.2 05/15/2021   MCV 94.6 05/15/2021   PLT 211 05/15/2021   Lab Results  Component Value Date   NA 136 07/05/2021   K 3.4 (L) 07/05/2021   CO2 25 05/15/2021  GLUCOSE 116 (H) 07/05/2021   BUN 10 07/05/2021   CREATININE 0.85 07/05/2021   BILITOT 0.6 07/05/2021   ALKPHOS 93 07/05/2021   AST 48 (H) 07/05/2021   ALT 24 05/15/2021   PROT 8.0 07/05/2021   ALBUMIN 4.6 07/05/2021   CALCIUM 9.9 07/05/2021   ANIONGAP 6 05/15/2021   EGFR 97 07/05/2021   Lab Results  Component Value Date   CHOL 113 07/05/2021   Lab Results  Component Value Date   HDL 49 07/05/2021   Lab Results  Component Value Date   LDLCALC 34 07/05/2021   Lab Results  Component Value Date   TRIG 190 (H) 07/05/2021   Lab Results  Component Value Date   CHOLHDL 2.3 07/05/2021   Lab Results  Component Value Date   HGBA1C 6.2 (A) 12/05/2021   HGBA1C 6.2 12/05/2021   HGBA1C 6.2 12/05/2021   HGBA1C 6.2 12/05/2021      Assessment & Plan:   Problem List Items Addressed  This Visit       Musculoskeletal and Integument   Rheumatoid arthritis ([place]). Persistent Encourage patient to follow the recommendation of orthopedic surgeon due to chronic pain Instructed patient that Tylenol codeine is a treatment option at this time    Relevant Medications   Acetaminophen-Codeine (TYLENOL/CODEINE #3) 300-30 MG tablet     Other   Alcoholic intoxication without complication ([place]) Resolved at this time   Other Visit Diagnoses     Type 2 diabetes mellitus with complication, without long-term current use of insulin ([place])    -  Primary Controlled Encourage compliance with current treatment regimen no current dosages NeededEncourage regular CBG monitoring Encourage contacting office if excessive hyperglycemia and or hypoglycemia Lifestyle modification with healthy diet (fewer calories, more high fiber foods, whole grains and non-starchy vegetables, lower fat meat and fish, low-fat diary include healthy oils) regular exercise (physical activity)  Opthalmology exam discussed  Nutritional consult recommended Regular dental visits encouraged Home BP monitoring also encouraged goal <130/80    Relevant Orders   POCT glycosylated hemoglobin (Hb A1C) (Completed)   Microalbumin/Creatinine Ratio, Urine (Completed)   Comp. Metabolic Panel (12) (Completed)       Meds ordered this encounter  Medications   Acetaminophen-Codeine (TYLENOL/CODEINE #3) 300-30 MG tablet    Sig: Take 1 tablet by mouth every 4 (four) hours as needed for pain.    Dispense:  30 tablet    Refill:  0    Order Specific Question:   Supervising Provider    Answer:   Tresa Garter [id number]    Follow-up: Return in about 6 months (around 06/04/2022) for Follow up HTN [id number].    [name], NP

## 2021-12-06 LAB — MICROALBUMIN / CREATININE URINE RATIO
Creatinine, Urine: 48.2 mg/dL
Microalb/Creat Ratio: 310 mg/g creat — ABNORMAL HIGH (ref 0–29)
Microalbumin, Urine: 149.5 ug/mL

## 2021-12-07 ENCOUNTER — Encounter: Payer: Self-pay | Admitting: Nurse Practitioner

## 2021-12-07 LAB — COMP. METABOLIC PANEL (12)
AST: 22 IU/L (ref 0–40)
Albumin/Globulin Ratio: 1.5 (ref 1.2–2.2)
Albumin: 4.6 g/dL (ref 3.8–4.8)
Alkaline Phosphatase: 89 IU/L (ref 44–121)
BUN/Creatinine Ratio: 14 (ref 10–24)
BUN: 11 mg/dL (ref 8–27)
Bilirubin Total: 0.5 mg/dL (ref 0.0–1.2)
Calcium: 9.7 mg/dL (ref 8.6–10.2)
Chloride: 100 mmol/L (ref 96–106)
Creatinine, Ser: 0.8 mg/dL (ref 0.76–1.27)
Globulin, Total: 3 g/dL (ref 1.5–4.5)
Glucose: 92 mg/dL (ref 70–99)
Potassium: 3.8 mmol/L (ref 3.5–5.2)
Sodium: 137 mmol/L (ref 134–144)
Total Protein: 7.6 g/dL (ref 6.0–8.5)
eGFR: 98 mL/min/1.73 (ref >59)

## 2021-12-18 ENCOUNTER — Other Ambulatory Visit: Payer: Self-pay | Admitting: Internal Medicine

## 2021-12-18 DIAGNOSIS — R51 Headache with orthostatic component, not elsewhere classified: Secondary | ICD-10-CM

## 2021-12-20 ENCOUNTER — Ambulatory Visit
Admission: RE | Admit: 2021-12-20 | Discharge: 2021-12-20 | Disposition: A | Discharge status: Home / Self Care | Payer: Medicare Other | Source: Ambulatory Visit | Attending: Orthopedic Surgery | Admitting: Orthopedic Surgery

## 2021-12-20 ENCOUNTER — Other Ambulatory Visit: Payer: Self-pay

## 2021-12-20 DIAGNOSIS — M25512 Pain in left shoulder: Secondary | ICD-10-CM

## 2021-12-25 ENCOUNTER — Other Ambulatory Visit: Payer: Self-pay

## 2021-12-25 ENCOUNTER — Other Ambulatory Visit: Payer: Medicare Other

## 2021-12-25 NOTE — Patient Outreach (Signed)
Medicaid Managed Care   Nurse Care Manager Note  12/25/2021 Name:  [name] MRN:  [id number] DOB:  [birth year]  [name] is an 66 y.o. year old male who is [name] primary patient of Brian Francois, NP.  The [facility] Managed Care Coordination team was consulted for assistance with:    HTN HLD DMII Left Shoulder Pain  Mr. Cuda was given information about Medicaid Managed Care Coordination team services today. [name] Patient agreed to services and verbal consent obtained.  Engaged with patient by telephone for follow up visit in response to provider referral for case management and/or care coordination services.   Assessments/Interventions:  Review of past medical history, allergies, medications, health status, including review of consultants reports, laboratory and other test data, was performed as part of comprehensive evaluation and provision of chronic care management services.  SDOH (Social Determinants of Health) assessments and interventions performed:   Care Plan  Allergies  Allergen Reactions   Lisinopril Other (See Comments)    angioedema   Citalopram Nausea Only   Morphine Nausea And Vomiting   Nsaids Other (See Comments)    Stomach Irritability    Paroxetine     Other reaction(s): NAUSEA,VOMITING    Medications Reviewed Today     Reviewed by [name], RN (Case Manager) on 12/25/21 at 1132  Med List Status: <None>   Medication Order Taking? Sig Documenting Provider Last Dose Status Informant  Acetaminophen-Codeine (TYLENOL/CODEINE #3) 300-30 MG tablet [id number]  Take 1 tablet by mouth every 4 (four) hours as needed for pain. Brian Francois, NP  Active   amLODipine (NORVASC) 5 MG tablet [id number] No Take 1 tablet (5 mg total) by mouth daily. Brian Francois, NP Taking Active   atorvastatin (LIPITOR) 40 MG tablet [id number] No Take 1 tablet (40 mg total) by mouth daily. Brian Francois, NP Taking Active   blood glucose meter kit and  supplies KIT [id number] No Dispense based on patient and insurance preference. Use up to four times daily as directed. Brian Francois, NP Taking Active   capsaicin (ZOSTRIX) 0.025 % cream [id number] No APPLY SMALL AMOUNT TOPICALLY THREE TIMES [name] DAY AVOID GETTING ON FACE, IN EYES OR ON SENSITIVE AREAS. [place] HANDS THOROUGHLY AFTER USING. [provider] Taking Active   clopidogrel (PLAVIX) 75 MG tablet [id number] No Take 1 tablet (75 mg total) by mouth daily. Brian Francois, NP Taking Active   famotidine (PEPCID) 20 MG tablet [id number] No Take 1 tablet (20 mg total) by mouth 2 (two) times daily. Take one tablet by mouth two times [name] day Brian Francois, NP Taking Active   folic acid (FOLVITE) 1 MG tablet [id number] No Take 1 tablet (1 mg total) by mouth daily.  Patient not taking: Reported on 12/05/2021   Brian Cota, MD Not Taking Active            Med Note [facility], [name]   Thu Jun 14, 2021 11:03 AM) Patient does not have  gabapentin (NEURONTIN) 300 MG capsule [id number] No Take 1 capsule (300 mg total) by mouth 4 (four) times daily. Take three capsules by mouth three times [name] day for tingling/pain Brian Francois, NP Taking Active   hydrochlorothiazide (MICROZIDE) 12.5 MG capsule [id number] No Take 1 capsule (12.5 mg total) by mouth daily. Brian Francois, NP Taking Active   metFORMIN (GLUCOPHAGE) 500 MG tablet [id number] No Take 1 tablet (500 mg total) by mouth daily with breakfast. [name],  Brian Foley, NP Taking Active   metoprolol tartrate (LOPRESSOR) 50 MG tablet [id number] No Take 1 tablet (50 mg total) by mouth 2 (two) times daily. Take one two times [name] day for blood pressure. Brian Francois, NP Taking Active   Multiple Vitamin (MULTIVITAMIN WITH MINERALS) TABS tablet [id number] No Take 1 tablet by mouth daily.  Patient not taking: Reported on 08/17/2021   Brian Cota, MD Not Taking Active            Med Note [facility], [name]   Thu Jun 14, 2021 11:03 AM) Patient does not  have  nicotine polacrilex (NICORETTE) 4 MG gum [id number] No CHEW 1 PIECE OF GUM BY MOUTH AS DIRECTED USE AT LEAST 8-10 PIECES EACH DAY TO START. DO NOT USE MORE THAN 24 PIECES IN [name] DAY. DO NOT EAT OR DRINK FOR 15 MINUTES BEFORE AND DURING USE  Patient not taking: Reported on 08/17/2021   [provider] Not Taking Active   tamsulosin (FLOMAX) 0.4 MG CAPS capsule [id number] No TAKE 2 CAPSULES BY MOUTH EVERY DAY Brian Francois, NP Taking Active   thiamine 100 MG tablet [id number] No Take 1 tablet (100 mg total) by mouth daily.  Patient not taking: Reported on 08/17/2021   Brian Cota, MD Not Taking Active            Med Note [facility], [name]   Thu Jun 14, 2021 11:03 AM) Patient does not have  traZODone (DESYREL) 100 MG tablet [id number] No Take 1 tablet (100 mg total) by mouth at bedtime. Take one table by mouth at bedtime as needed for sleep. Brian Francois, NP Taking Active             Patient Active Problem List   Diagnosis Date Noted   Alcoholic intoxication without complication ([name]) 69/67/8938   Marijuana use 05/16/2021   CVA (cerebral vascular accident) ([name]) 05/14/2021   Alcohol abuse 05/14/2021   Tobacco abuse 05/14/2021   DM2 (diabetes mellitus, type 2) ([name]) 05/14/2021   Angioedema 02/09/2017   Primary localized osteoarthritis of right hip 01/02/2016   Groin rash 08/13/5101   DM w/o complication type II ([name]) 06/16/2013   BPH (benign prostatic hyperplasia) 11/24/2012   Healthcare maintenance 11/24/2012   Pancreatitis 10/31/2012   Chronic pain syndrome 09/18/2011   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/19/2011   Right rotator cuff tear 07/26/2011   Shoulder pain, right 05/03/2011   Right ankle pain 01/23/2011   Left wrist pain 01/23/2011   Back pain 01/02/2011   MOLLUSCUM CONTAGIOSUM 12/05/2008   LEUKOCYTOSIS UNSPECIFIED 03/25/2008   LIVER FUNCTION TESTS, ABNORMAL 03/25/2008   ERECTILE DYSFUNCTION 05/12/2007   Essential hypertension 03/02/2007    Hypercholesteremia 01/08/2007   ANXIETY 01/08/2007   GERD 01/08/2007   Rheumatoid arthritis ([name]) 01/08/2007   OSTEOARTHRITIS 01/08/2007    Conditions to be addressed/monitored per PCP order:  HTN, HLD, DMII, and Left Shoulder Pain  Care Plan : RN Care Manger Plan of Care  Updates made by [name], RN since 12/25/2021 12:00 AM     Problem: Chronic Disease Management and Care Coordination Needs for CVA, HTN, HLD, DM II, Chronic Pain.      Long-Range Goal: Development of Plan of Care for Chronic Disease Management and Care Coordination Needs (CVA, HTN, HLD, DM II, Chronic Pain   Start Date: 06/14/2021  Expected End Date: 02/24/2022  Priority: High  Note:   Current Barriers:  Knowledge Deficits related to plan of care for management of HTN,  HLD, DMII, and  Chronic Left Shoulder Pain  Care Coordination needs related to Lacks knowledge of community resource: for different medicaid plans and what they offer especially dental care  Chronic Disease Management support and education needs related to HTN, HLD, DMII, and  Chronic Left Shoulder Pain Non-adherence to prescribed medication regimen  RNCM Clinical Goal(s):  Patient will verbalize understanding of plan for management of HTN, HLD, DMII, and Chronic Left Shoulder Pain as evidenced by improved control of all noted diseases verbalize basic understanding of HTN, HLD, DMII, and Chronic Left Shoulder Pain disease process and self health management plan as evidenced by documented self monitoring activities of DM, HTN and chronic pain. take all medications exactly as prescribed and will call provider for medication related questions as evidenced by reported medication compliance.    attend all scheduled medical appointments: no upcoming appointments scheduled at this time; as evidenced by attending scheduled appointments        demonstrate ongoing adherence to prescribed treatment plan for HTN, HLD, DMII, and Chronic Left shoulder pain  as evidenced by well controlled disease management of noted diseases. demonstrate ongoing health management independence as evidenced by well controlled disease management of noted diseases.        continue to work with Consulting civil engineer and/or Social Worker to address care management and care coordination needs related to HTN, HLD, DMII, and Chronic Left Shoulder pain as evidenced by adherence to CM Team Scheduled appointments     work with pharmacist to address Medication procurement and complex medication regimen related to HTN, HLD, DMII, and Chronic Left Shoulder Pain as evidenced by review of EMR and patient or pharmacist report    work with Education officer, museum to address Lacks knowledge of community resource: for different medicaid plans and what they offer especially dental care  related to the management of HTN, HLD, DMII, and Chronic Left Shoulder pain as evidenced by review of EMR and patient or social worker report     through collaboration with Consulting civil engineer, provider, and care team.  BSW contacted patient on 09/18/21 and provided information regarding dental care and audiologist coverage.  This goal met.   Interventions: Inter-disciplinary care team collaboration (see longitudinal plan of care) Evaluation of current treatment plan related to  self management and patient's adherence to plan as established by provider 09/18/21:BSW contacted patient regarding dental and audiology resources. BSW researched and called and no one take Medicaid. BSW informed patient that the most affordable was at [place] located [facility] [employer] and the start off at $199 for 2 aids. Patient stated that he did need dental resources, he stated he would like for the list of resources to mailed to him. BSW will have list mailed to patient.   Diabetes:  (Status: Goal on Track (progressing): YES.)   Lab Results  Component Value Date   HGBA1C 6.2 ([name]) 12/05/2021   HGBA1C 6.2 12/05/2021   HGBA1C 6.2 12/05/2021   HGBA1C 6.2  12/05/2021      Assessed patient's understanding of A1c goal: <7%  A1C increased slightly from 5.7 to 6.2 Provided education to patient about basic DM disease process; Reviewed medications with patient and discussed importance of medication adherence;        Counseled on importance of regular laboratory monitoring as prescribed;        Discussed plans with patient for ongoing care management follow up and provided patient with direct contact information for care management team;      Reviewed scheduled/upcoming  provider appointments including: no scheduled upcoming appointments at this time  Advised patient, providing education and rationale, to check blood sugar.  Patient states he usually checks blood sugars once or twice [name] week but patient reprots he has not checked blood sugar in last 2 weeks because he has been very busy.  Patient states he will get back to checking blood sugar on [name] regular basis.  Patient denies any signs of symptoms of hypo or hyperglycemia.                Hyperlipidemia:  (Status: Goal on Track (progressing): YES.) Lab results from [place] Visit on 12/13/2021 CHOLESTEROL, TOTAL 167    100-199          HDL CHOLESTEROL 45    29-71        LDL CHOLESTEROL 98    0-99        TRIGLYCERIDE 220  H 35-200    Lab Results  Component Value Date   CHOL 113 07/05/2021   HDL 49 07/05/2021   LDLCALC 34 07/05/2021   LDLDIRECT 96.5 05/14/2021   TRIG 190 (H) 07/05/2021   CHOLHDL 2.3 07/05/2021      Lab Results  Component Value Date   LDLCALC 34 07/05/2021       Lab Results  Component Value Date   HDL 49 07/05/2021   HDL 47 05/15/2021   HDL 64 02/12/2017   Lab Results  Component Value Date   TRIG 190 (H) 07/05/2021   TRIG 258 (H) 05/15/2021   TRIG 144 02/12/2017  Medication review performed; medication list updated in electronic medical record.  Provider established cholesterol goals reviewed; Counseled on importance of regular laboratory monitoring as prescribed; Reviewed  lab results from [place] visit on 12/13/2021.  Continued review of role and benefits of statin for ASCVD risk reduction; Continued review of importance of limiting foods high in cholesterol;    Hypertension Interventions: Goal on Track (progressing) Yes Last practice recorded BP readings:  BP Readings from Last 3 Encounters:  12/05/21 136/77  08/17/21 (!) 148/82  08/15/21 136/82        Most recent eGFR/CrCl:  Lab Results  Component Value Date   EGFR 97 07/05/2021    No components found for: CRCL  Evaluation of current treatment plan related to hypertension self management and patient's adherence to plan as established by provider; Reviewed medications with patient and discussed importance of compliance; Discussed plans with patient for ongoing care management follow up and provided patient with direct contact information for care management team; Advised patient, providing education and rationale, to monitor blood pressure daily and record, calling PCP for findings outside established parameters;  Reviewed scheduled/upcoming provider appointments including: no scheduled upcoming appointments Patient reports he has not checked his blood pressure in last 2 weeks due to being extremely busy. He has good medication compliance overall but patient states he need refills on all oral medications.  I reviewed current medication list and there are available refills on all his medications except Tylenol #3 tabs.  Patient will contact the [facility] pharmacy today for refills.  Pain Interventions: Pain assessment performed Medications reviewed.  Tylenol #3 tabs ordered for pain management on 12/05/2021.  Patient reports his pain as [name] "8" currently and even wakes him up when sleeping in the morning.  Patient states his pain "feels no difference" and no real effective pain relief with use of Tylenol #3. Reviewed provider established plan for pain management; Discussed importance of adherence to all scheduled  medical appointments; Counseled on the importance of reporting any/all new or changed pain symptoms or management strategies to pain management provider; Advised patient to report to care team affect of pain on daily activities; Reviewed with patient prescribed pharmacological and nonpharmacological pain relief strategies; Patient had CT of Left Shoulder on 12/20/2021 and is awaiting results from Dr. Marlou Sa.  Patient thinks that Left shoulder replacement surgery may be needed to relieve Left shoulder pain in the near future.    Patient Goals/Self-Care Activities: Patient will self administer medications as prescribed as evidenced by self report/primary caregiver report  Patient will attend all scheduled provider appointments as evidenced by clinician review of documented attendance to scheduled appointments and patient/caregiver report Patient will call pharmacy for medication refills as evidenced by patient report and review of pharmacy fill history as appropriate Patient will continue to perform ADL's independently as evidenced by patient/caregiver report Patient will continue to perform IADL's independently as evidenced by patient/caregiver report Patient will call provider office for new concerns or questions as evidenced by review of documented incoming telephone call notes and patient report check blood sugar at prescribed times: three times daily enter blood sugar readings and medication or insulin into daily log take the blood sugar log to all doctor visits - check blood pressure 3 times per week - write blood pressure results in [name] log or diary - keep [name] blood pressure log - take blood pressure log to all doctor appointments - keep all doctor appointments - take medications for blood pressure exactly as prescribed - report new symptoms to your doctor - take all medications exactly as prescribed - call doctor with any symptoms you believe are related to your medicine - call doctor when  you experience any new symptoms - go to all doctor appointments as scheduled       Follow Up:  Patient agrees to Care Plan and Follow-up.  Plan: The Managed Medicaid care management team will reach out to the patient again over the next 30 days.  Date/time of next scheduled RN care management/care coordination outreach:  01/22/2022 at 11:00 am  [place] [place] [facility] Mobile: [phone]

## 2021-12-25 NOTE — Patient Instructions (Signed)
Visit Information  [name] was given information about Medicaid Managed Care team care coordination services as a part of their [place] Medicaid benefit. [name] verbally consented to engagement with the [facility] Managed Care team.   If you are experiencing a medical emergency, please call 911 or report to your local emergency department or urgent care.   If you have a non-emergency medical problem during routine business hours, please contact your provider's office and ask to speak with a nurse.   For questions related to your [facility], please call: [phone] or visit the homepage here: [email or web address]  If you would like to schedule transportation through your [facility], please call the following number at least 2 days in advance of your appointment: [phone].  Rides for urgent appointments can also be made after hours by calling Member Services.  Call the [place] at [phone], at any time, 24 hours a day, 7 days a week. If you are in danger or need immediate medical attention call 911.  If you would like help to quit smoking, call 1-800-QUIT-NOW (989) 717-8225) OR Espaol: 1-855-Djelo-Ya (1-173-567-0141) o para ms informacin haga clic aqu or Text READY to 200-400 to register via text  [name] - following are the goals we discussed in your visit today:   Goals Addressed             This Visit's Progress    Blood Pressure < 140/90       Timeframe:  Long-Range Goal Priority:  High Start Date: 06/13/2021                       Expected End Date:   02/24/2022    Patient will self administer medications as prescribed Patient will attend all scheduled provider appointments Patient will call pharmacy for medication refills   Patient will continue to perform ADL's independently as evidenced by  patient/caregiver report Patient will continue to perform IADL's independently as evidenced by patient/caregiver report - check blood pressure 3 times per week - write blood pressure results in a log or diary - keep a blood pressure log - take blood pressure log to all doctor appointments - take medications for blood pressure exactly as prescribed - report new symptoms to your doctor - take all medications exactly as prescribed - call doctor with any symptoms you believe are related to your medicine - call doctor when you experience any new symptoms     HLD Management       Timeframe:  Long-Range Goal Priority:  High Start Date: 06/14/2021       Expected End Date:  02/24/2022                  Patient will self administer medications as prescribed Patient will attend all scheduled provider appointments Patient will call pharmacy for medication refills        Patient will continue to perform ADL's independently as evidenced by patient/caregiver report Patient will continue to perform IADL's independently as evidenced by patient/caregiver report Patient will call provider office for new concerns or questions as evidenced by review of documented incoming telephone call notes and patient report - keep all doctor appointments - take medications for blood pressure exactly as prescribed - report new symptoms to your doctor - take all medications exactly as prescribed - call doctor with any symptoms you believe are related to your medicine - call doctor  when you experience any new symptoms - go to all doctor appointments as scheduled               RNCM Blood sugar monitoring and management       Timeframe:  Long-Range Goal Priority:  High Start Date:  06/14/2021                       Expected End Date:  02/24/2022            Patient will self administer medications as prescribed as evidenced by self report/primary caregiver report  Patient will attend all scheduled provider appointments as  evidenced by clinician review of documented attendance to scheduled appointments and patient/caregiver report Patient will call pharmacy for medication refills as evidenced by patient report and review of pharmacy fill history as appropriate Patient will continue to perform ADL's independently as evidenced by patient/caregiver report Patient will continue to perform IADL's independently as evidenced by patient/caregiver report Patient will call provider office for new concerns or questions as evidenced by review of documented incoming telephone call notes and patient report check blood sugar at prescribed times: three times daily enter blood sugar readings and medication or insulin into daily log take the blood sugar log to all doctor visits - keep all doctor appointments - take medications for blood pressure exactly as prescribed - report new symptoms to your doctor - take all medications exactly as prescribed - call doctor with any symptoms you believe are related to your medicine - call doctor when you experience any new symptoms - go to all doctor appointments as scheduled        Please see education materials related to today's visit provided as print materials.   The patient verbalized understanding of instructions, educational materials, and care plan provided today and agreed to receive a mailed copy of patient instructions, educational materials, and care plan.   The Managed Medicaid care management team will reach out to the patient again over the next 30 days.   Brian Marvel RN, BSN Community Care Coordinator [facility] Mobile: [phone]   Following is a copy of your plan of care:  Care Plan : [place] of Care  Updates made by [name], RN since 12/25/2021 12:00 AM     Problem: Chronic Disease Management and Care Coordination Needs for CVA, HTN, HLD, DM II, Chronic Pain.      Long-Range Goal: Development of  Plan of Care for Chronic Disease Management and Care Coordination Needs (CVA, HTN, HLD, DM II, Chronic Pain   Start Date: 06/14/2021  Expected End Date: 02/24/2022  Priority: High  Note:   Current Barriers:  Knowledge Deficits related to plan of care for management of HTN, HLD, DMII, and  Chronic Left Shoulder Pain  Care Coordination needs related to Lacks knowledge of community resource: for different medicaid plans and what they offer especially dental care  Chronic Disease Management support and education needs related to HTN, HLD, DMII, and  Chronic Left Shoulder Pain Non-adherence to prescribed medication regimen  RNCM Clinical Goal(s):  Patient will verbalize understanding of plan for management of HTN, HLD, DMII, and Chronic Left Shoulder Pain as evidenced by improved control of all noted diseases verbalize basic understanding of HTN, HLD, DMII, and Chronic Left Shoulder Pain disease process and self health management plan as evidenced by documented self monitoring activities of DM, HTN and chronic pain. take all medications exactly  as prescribed and will call provider for medication related questions as evidenced by reported medication compliance.    attend all scheduled medical appointments: no upcoming appointments scheduled at this time; as evidenced by attending scheduled appointments        demonstrate ongoing adherence to prescribed treatment plan for HTN, HLD, DMII, and Chronic Left shoulder pain as evidenced by well controlled disease management of noted diseases. demonstrate ongoing health management independence as evidenced by well controlled disease management of noted diseases.        continue to work with Consulting civil engineer and/or Social Worker to address care management and care coordination needs related to HTN, HLD, DMII, and Chronic Left Shoulder pain as evidenced by adherence to CM Team Scheduled appointments     work with pharmacist to address Medication procurement and  complex medication regimen related to HTN, HLD, DMII, and Chronic Left Shoulder Pain as evidenced by review of EMR and patient or pharmacist report    work with Education officer, museum to address Lacks knowledge of community resource: for different medicaid plans and what they offer especially dental care  related to the management of HTN, HLD, DMII, and Chronic Left Shoulder pain as evidenced by review of EMR and patient or social worker report     through collaboration with Consulting civil engineer, provider, and care team.  BSW contacted patient on 09/18/21 and provided information regarding dental care and audiologist coverage.  This goal met.   Interventions: Inter-disciplinary care team collaboration (see longitudinal plan of care) Evaluation of current treatment plan related to  self management and patient's adherence to plan as established by provider 09/18/21:BSW contacted patient regarding dental and audiology resources. BSW researched and called and no one take Medicaid. BSW informed patient that the most affordable was at [place] [facility] [employer] and the start off at $199 for 2 aids. Patient stated that he did need dental resources, he stated he would like for the list of resources to mailed to him. BSW will have list mailed to patient.   Diabetes:  (Status: Goal on Track (progressing): YES.)   Lab Results  Component Value Date   HGBA1C 6.2 (A) 12/05/2021   HGBA1C 6.2 12/05/2021   HGBA1C 6.2 12/05/2021   HGBA1C 6.2 12/05/2021      Assessed patient's understanding of A1c goal: <7%  A1C increased slightly from 5.7 to 6.2 Provided education to patient about basic DM disease process; Reviewed medications with patient and discussed importance of medication adherence;        Counseled on importance of regular laboratory monitoring as prescribed;        Discussed plans with patient for ongoing care management follow up and provided patient with direct contact information for care management team;       Reviewed scheduled/upcoming provider appointments including: no scheduled upcoming appointments at this time  Advised patient, providing education and rationale, to check blood sugar.  Patient states he usually checks blood sugars once or twice a week but patient reprots he has not checked blood sugar in last 2 weeks because he has been very busy.  Patient states he will get back to checking blood sugar on a regular basis.  Patient denies any signs of symptoms of hypo or hyperglycemia.                Hyperlipidemia:  (Status: Goal on Track (progressing): YES.) Lab results from [place] Visit on 12/13/2021 CHOLESTEROL, TOTAL 167    100-199  HDL CHOLESTEROL 45    29-71        LDL CHOLESTEROL 98    0-99        TRIGLYCERIDE 220  H 35-200    Lab Results  Component Value Date   CHOL 113 07/05/2021   HDL 49 07/05/2021   LDLCALC 34 07/05/2021   LDLDIRECT 96.5 05/14/2021   TRIG 190 (H) 07/05/2021   CHOLHDL 2.3 07/05/2021      Lab Results  Component Value Date   LDLCALC 34 07/05/2021       Lab Results  Component Value Date   HDL 49 07/05/2021   HDL 47 05/15/2021   HDL 64 02/12/2017   Lab Results  Component Value Date   TRIG 190 (H) 07/05/2021   TRIG 258 (H) 05/15/2021   TRIG 144 02/12/2017  Medication review performed; medication list updated in electronic medical record.  Provider established cholesterol goals reviewed; Counseled on importance of regular laboratory monitoring as prescribed; Reviewed lab results from [place] visit on 12/13/2021.  Continued review of role and benefits of statin for ASCVD risk reduction; Continued review of importance of limiting foods high in cholesterol;    Hypertension Interventions: Goal on Track (progressing) Yes Last practice recorded BP readings:  BP Readings from Last 3 Encounters:  12/05/21 136/77  08/17/21 (!) 148/82  08/15/21 136/82        Most recent eGFR/CrCl:  Lab Results  Component Value Date   EGFR 97 07/05/2021    No  components found for: CRCL  Evaluation of current treatment plan related to hypertension self management and patient's adherence to plan as established by provider; Reviewed medications with patient and discussed importance of compliance; Discussed plans with patient for ongoing care management follow up and provided patient with direct contact information for care management team; Advised patient, providing education and rationale, to monitor blood pressure daily and record, calling PCP for findings outside established parameters;  Reviewed scheduled/upcoming provider appointments including: no scheduled upcoming appointments Patient reports he has not checked his blood pressure in last 2 weeks due to being extremely busy. He has good medication compliance overall but patient states he need refills on all oral medications.  I reviewed current medication list and there are available refills on all his medications except Tylenol #3 tabs.  [facility] will [facility] today for refills.  Pain Interventions: Pain assessment performed Medications reviewed.  Tylenol #3 tabs ordered for pain management on 12/05/2021.  Patient reports his pain as a "8" currently and even wakes him up when sleeping in the morning.  Patient states his pain "feels no difference" and no real effective pain relief with use of Tylenol #3. Reviewed provider established plan for pain management; Discussed importance of adherence to all scheduled medical appointments; Counseled on the importance of reporting any/all new or changed pain symptoms or management strategies to pain management provider; Advised patient to report to care team affect of pain on daily activities; Reviewed with patient prescribed pharmacological and nonpharmacological pain relief strategies; Patient had CT of Left Shoulder on 12/20/2021 and is awaiting results from Dr. Marlou Sa.  Patient thinks that Left shoulder replacement surgery may be needed to  relieve Left shoulder pain in the near future.    Patient Goals/Self-Care Activities: Patient will self administer medications as prescribed as evidenced by self report/primary caregiver report  Patient will attend all scheduled provider appointments as evidenced by clinician review of documented attendance to scheduled appointments and patient/caregiver report Patient will call pharmacy  for medication refills as evidenced by patient report and review of pharmacy fill history as appropriate Patient will continue to perform ADL's independently as evidenced by patient/caregiver report Patient will continue to perform IADL's independently as evidenced by patient/caregiver report Patient will call provider office for new concerns or questions as evidenced by review of documented incoming telephone call notes and patient report check blood sugar at prescribed times: three times daily enter blood sugar readings and medication or insulin into daily log take the blood sugar log to all doctor visits - check blood pressure 3 times per week - write blood pressure results in a log or diary - keep a blood pressure log - take blood pressure log to all doctor appointments - keep all doctor appointments - take medications for blood pressure exactly as prescribed - report new symptoms to your doctor - take all medications exactly as prescribed - call doctor with any symptoms you believe are related to your medicine - call doctor when you experience any new symptoms - go to all doctor appointments as scheduled

## 2021-12-31 NOTE — Progress Notes (Signed)
Scan done.  Please schedule.

## 2022-01-15 ENCOUNTER — Other Ambulatory Visit: Payer: Self-pay

## 2022-01-22 ENCOUNTER — Other Ambulatory Visit: Payer: Self-pay

## 2022-01-22 NOTE — Patient Instructions (Signed)
Visit Information ? ?[name] was given information about Medicaid Managed Care team care coordination services as a part of their [place] Medicaid benefit. [name] verbally consented to engagement with the [facility] Managed Care team.  ? ?If you are experiencing a medical emergency, please call 911 or report to your local emergency department or urgent care.  ? ?If you have a non-emergency medical problem during routine business hours, please contact your provider's office and ask to speak with a nurse.  ? ?For questions related to your [facility], please call: [phone] or visit the homepage here: [email or web address] ? ?If you would like to schedule transportation through your [facility], please call the following number at least 2 days in advance of your appointment: [phone]. ? Rides for urgent appointments can also be made after hours by calling Member Services. ? ?Call the [place] at [phone], at any time, 24 hours a day, 7 days a week. If you are in danger or need immediate medical attention call 911. ? ?If you would like help to quit smoking, call 1-800-QUIT-NOW 343-359-7971) OR Espa?ol: 1-855-D?jelo-Ya [phone]) o para m?s informaci?n haga clic aqu? or Text READY to 200-400 to register via text ? ?[name] - following are the goals we discussed in your visit today:  ? Goals Addressed   ? ?  ?  ?  ?  ? This Visit's Progress  ?  Blood Pressure < 140/90     ?  Timeframe:  Long-Range Goal ?Priority:  High ?Start Date: 06/13/2021                       ?Expected End Date:   03/26/2022   ? ?Patient will self administer medications as prescribed ?Patient will attend all scheduled provider appointments ?Patient will call pharmacy for medication refills   ?Patient will continue to perform ADL's independently as evidenced by  patient/caregiver report ?Patient will continue to perform IADL's independently as evidenced by patient/caregiver report ?- check blood pressure 3 times per week ?- write blood pressure results in a log or diary ?- keep a blood pressure log ?- take blood pressure log to all doctor appointments ?- take medications for blood pressure exactly as prescribed ?- report new symptoms to your doctor ?- take all medications exactly as prescribed ?- call doctor with any symptoms you believe are related to your medicine ?- call doctor when you experience any new symptoms ?  ?  HLD Management     ?  Timeframe:  Long-Range Goal ?Priority:  High ?Start Date: 06/14/2021       ?Expected End Date:  03/26/2022 ?                 ?Patient will self administer medications as prescribed ?Patient will attend all scheduled provider appointments ?Patient will call pharmacy for medication refills        ?Patient will continue to perform ADL's independently as evidenced by patient/caregiver report ?Patient will continue to perform IADL's independently as evidenced by patient/caregiver report ?Patient will call provider office for new concerns or questions as evidenced by review of documented incoming telephone call notes and patient report ?- keep all doctor appointments ?- take medications for blood pressure exactly as prescribed ?- report new symptoms to your doctor ?- take all medications exactly as prescribed ?- call doctor with any symptoms you believe are related to your medicine ?- call doctor  when you experience any new symptoms ?- go to all doctor appointments as scheduled ?          ?  ?  RNCM Blood sugar monitoring and management     ?  Timeframe:  Long-Range Goal ?Priority:  High ?Start Date:  06/14/2021                       ?Expected End Date:  03/26/2022         ? ?  Patient will self administer medications as prescribed as evidenced by self report/primary caregiver report  ?Patient will attend all scheduled provider appointments as  evidenced by clinician review of documented attendance to scheduled appointments and patient/caregiver report ?Patient will call pharmacy for medication refills as evidenced by patient report and review of pharmacy fill history as appropriate ?Patient will continue to perform ADL's independently as evidenced by patient/caregiver report ?Patient will continue to perform IADL's independently as evidenced by patient/caregiver report ?Patient will call provider office for new concerns or questions as evidenced by review of documented incoming telephone call notes and patient report ?check blood sugar at prescribed times: three times daily ?enter blood sugar readings and medication or insulin into daily log ?take the blood sugar log to all doctor visits ?- keep all doctor appointments ?- take medications for blood pressure exactly as prescribed ?- report new symptoms to your doctor ?- take all medications exactly as prescribed ?- call doctor with any symptoms you believe are related to your medicine ?- call doctor when you experience any new symptoms ?- go to all doctor appointments as scheduled ?  ? ?  ? ? ?Please see education materials related to today's visit provided as print materials.  ? ?The patient verbalized understanding of instructions, educational materials, and care plan provided today and agreed to receive a mailed copy of patient instructions, educational materials, and care plan.  ? ?The Managed Medicaid care management team will reach out to the patient again over the next 30 days.  ? ?[name], BSN ?Community Care Coordinator ?[facility] ?Mobile: [phone]  ? ?Following is a copy of your plan of care:  ?Care Plan : [place] of Care  ?Updates made by [name], RN since 01/22/2022 12:00 AM  ?  ? ?Problem: Chronic Disease Management and Care Coordination Needs for CVA, HTN, HLD, DM II, Chronic Pain.   ?  ? ?Long-Range Goal: Development of  Plan of Care for Chronic Disease Management and Care Coordination Needs (CVA, HTN, HLD, DM II, Chronic Pain   ?Start Date: 06/14/2021  ?Expected End Date: 03/26/2022  ?Priority: High  ?Note:   ?Current Barriers:  ?Knowledge Deficits related to plan of care for management of HTN, HLD, DMII, and  Chronic Left Shoulder Pain  ?Care Coordination needs related to Lacks knowledge of community resource: for different medicaid plans and what they offer especially dental care  ?Chronic Disease Management support and education needs related to HTN, HLD, DMII, and  Chronic Left Shoulder Pain ?Non-adherence to prescribed medication regimen ? ?RNCM Clinical Goal(s):  ?Patient will verbalize understanding of plan for management of HTN, HLD, DMII, and Chronic Left Shoulder Pain as evidenced by improved control of all noted diseases ?verbalize basic understanding of HTN, HLD, DMII, and Chronic Left Shoulder Pain disease process and self health management plan as evidenced by documented self monitoring activities of DM, HTN and chronic pain. ?take all medications exactly as  prescribed and will call provider for medication related questions as evidenced by reported medication compliance.    ?attend all scheduled medical appointments: 02/13/2022 Colonoscopy; 03/12/2022 Dr Marlou Sa Orthopedic surgery ; as evidenced by attending scheduled appointments        ?demonstrate ongoing adherence to prescribed treatment plan for HTN, HLD, DMII, and Chronic Left shoulder pain as evidenced by well controlled disease management of noted diseases. ?demonstrate ongoing health management independence as evidenced by well controlled disease management of noted diseases.        ?continue to work with Consulting civil engineer and/or Social Worker to address care management and care coordination needs related to HTN, HLD, DMII, and Chronic Left Shoulder pain as evidenced by adherence to CM Team Scheduled appointments     ?work with pharmacist to address Medication  procurement and complex medication regimen related to HTN, HLD, DMII, and Chronic Left Shoulder Pain as evidenced by review of EMR and patient or pharmacist report    ?work with Education officer, museum to address Lacks knowledge of c

## 2022-01-22 NOTE — Patient Outreach (Signed)
?Medicaid Managed Care   ?Nurse Care Manager Note ? ?01/22/2022 ?Name:  [name] MRN:  [id number] DOB:  1956-03-08 ? ?[name] is an 66 y.o. year old male who is [name] primary patient of Brian Francois, NP.  The [facility] Managed Care Coordination team was consulted for assistance with:    ?HTN ?HLD ?DMII ?Pain Management of Left shoulder pain ? ?[name] was given information about Medicaid Managed Care Coordination team services today. [name] Patient agreed to services and verbal consent obtained. ? ?Engaged with patient by telephone for follow up visit in response to provider referral for case management and/or care coordination services.  ? ?Assessments/Interventions:  Review of past medical history, allergies, medications, health status, including review of consultants reports, laboratory and other test data, was performed as part of comprehensive evaluation and provision of chronic care management services. ? ?SDOH (Social Determinants of Health) assessments and interventions performed: ? ? ?Care Plan ? ?Allergies  ?Allergen Reactions  ? Lisinopril Other (See Comments)  ?  angioedema  ? Citalopram Nausea Only  ? Morphine Nausea And Vomiting  ? Nsaids Other (See Comments)  ?  Stomach Irritability   ? Paroxetine   ?  Other reaction(s): NAUSEA,VOMITING  ? ? ?Medications Reviewed Today   ? ? Reviewed by Brian Rise, RN (Case Manager) on 01/22/22 at 1120  Med List Status: <None>  ? ?Medication Order Taking? Sig Documenting Provider Last Dose Status Informant  ?Acetaminophen-Codeine (TYLENOL/CODEINE #3) 300-30 MG tablet [id number]  Take 1 tablet by mouth every 4 (four) hours as needed for pain. Brian Francois, NP  Active   ?amLODipine (NORVASC) 5 MG tablet [id number] No Take 1 tablet (5 mg total) by mouth daily. Brian Francois, NP Taking Active   ?atorvastatin (LIPITOR) 40 MG tablet [id number] No Take 1 tablet (40 mg total) by mouth daily. Brian Francois, NP Taking Active   ?blood glucose  meter kit and supplies KIT [id number] No Dispense based on patient and insurance preference. Use up to four times daily as directed. Brian Francois, NP Taking Active   ?capsaicin (ZOSTRIX) 0.025 % cream [id number] No APPLY SMALL AMOUNT TOPICALLY THREE TIMES [name] DAY AVOID GETTING ON FACE, IN EYES OR ON SENSITIVE AREAS. [name] HANDS THOROUGHLY AFTER USING. [provider] Taking Active   ?clopidogrel (PLAVIX) 75 MG tablet [id number] No Take 1 tablet (75 mg total) by mouth daily. Brian Francois, NP Taking Active   ?famotidine (PEPCID) 20 MG tablet [id number] No Take 1 tablet (20 mg total) by mouth 2 (two) times daily. Take one tablet by mouth two times [name] day Brian Francois, NP Taking Active   ?folic acid (FOLVITE) 1 MG tablet [id number] No Take 1 tablet (1 mg total) by mouth daily.  ?Patient not taking: Reported on 12/05/2021  ? Brian Cota, MD Not Taking Active   ?         ?Med Note [name], [name]   Thu Jun 14, 2021 11:03 AM) Patient does not have  ?gabapentin (NEURONTIN) 300 MG capsule [id number] No Take 1 capsule (300 mg total) by mouth 4 (four) times daily. Take three capsules by mouth three times [name] day for tingling/pain Brian Francois, NP Taking Active   ?hydrochlorothiazide (MICROZIDE) 12.5 MG capsule [id number] No Take 1 capsule (12.5 mg total) by mouth daily. Brian Francois, NP Taking Active   ?metFORMIN (GLUCOPHAGE) 500 MG tablet [id number] No Take 1 tablet (500 mg total) by mouth daily  with breakfast. Brian Francois, NP Taking Active   ?metoprolol tartrate (LOPRESSOR) 50 MG tablet [id number] No Take 1 tablet (50 mg total) by mouth 2 (two) times daily. Take one two times [name] day for blood pressure. Brian Francois, NP Taking Active   ?Multiple Vitamin (MULTIVITAMIN WITH MINERALS) TABS tablet [id number] No Take 1 tablet by mouth daily.  ?Patient not taking: Reported on 08/17/2021  ? Brian Cota, MD Not Taking Active   ?         ?Med Note [name], [name]   Thu Jun 14, 2021 11:03 AM)  Patient does not have  ?nicotine polacrilex (NICORETTE) 4 MG gum [id number] No CHEW 1 PIECE OF GUM BY MOUTH AS DIRECTED USE AT LEAST 8-10 PIECES EACH DAY TO START. DO NOT USE MORE THAN 24 PIECES IN [name] DAY. DO NOT EAT OR DRINK FOR 15 MINUTES BEFORE AND DURING USE  ?Patient not taking: Reported on 08/17/2021  ? [provider] Not Taking Active   ?tamsulosin (FLOMAX) 0.4 MG CAPS capsule [id number] No TAKE 2 CAPSULES BY MOUTH EVERY DAY Brian Francois, NP Taking Active   ?thiamine 100 MG tablet [id number] No Take 1 tablet (100 mg total) by mouth daily.  ?Patient not taking: Reported on 08/17/2021  ? Brian Cota, MD Not Taking Active   ?         ?Med Note [facility], Shaiden Aldous [name]   Thu Jun 14, 2021 11:03 AM) Patient does not have  ?traZODone (DESYREL) 100 MG tablet [id number] No Take 1 tablet (100 mg total) by mouth at bedtime. Take one table by mouth at bedtime as needed for sleep. Brian Francois, NP Taking Active   ? ?  ?  ? ?  ? ? ?Patient Active Problem List  ? Diagnosis Date Noted  ? Alcoholic intoxication without complication ([name]) 96/28/3662  ? Marijuana use 05/16/2021  ? CVA (cerebral vascular accident) ([name]) 05/14/2021  ? Alcohol abuse 05/14/2021  ? Tobacco abuse 05/14/2021  ? DM2 (diabetes mellitus, type 2) ([name]) 05/14/2021  ? Angioedema 02/09/2017  ? Primary localized osteoarthritis of right hip 01/02/2016  ? Groin rash 06/16/2013  ? DM w/o complication type II ([name]) 06/16/2013  ? BPH (benign prostatic hyperplasia) 11/24/2012  ? Healthcare maintenance 11/24/2012  ? Pancreatitis 10/31/2012  ? Chronic pain syndrome 09/18/2011  ? 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/19/2011  ? Right rotator cuff tear 07/26/2011  ? Shoulder pain, right 05/03/2011  ? Right ankle pain 01/23/2011  ? Left wrist pain 01/23/2011  ? Back pain 01/02/2011  ? MOLLUSCUM CONTAGIOSUM 12/05/2008  ? LEUKOCYTOSIS UNSPECIFIED 03/25/2008  ? LIVER FUNCTION TESTS, ABNORMAL 03/25/2008  ? ERECTILE DYSFUNCTION 05/12/2007  ? Essential hypertension 03/02/2007  ?  Hypercholesteremia 01/08/2007  ? ANXIETY 01/08/2007  ? GERD 01/08/2007  ? Rheumatoid arthritis ([place]) 01/08/2007  ? OSTEOARTHRITIS 01/08/2007  ? ? ?Conditions to be addressed/monitored per PCP order:  HTN, HLD, DMII, and Pain Management of Left Shoulder Pain ? ?Care Plan : RN Care Manger Plan of Care  ?Updates made by Brian Rise, RN since 01/22/2022 12:00 AM  ?  ? ?Problem: Chronic Disease Management and Care Coordination Needs for CVA, HTN, HLD, DM II, Chronic Pain.   ?  ? ?Long-Range Goal: Development of Plan of Care for Chronic Disease Management and Care Coordination Needs (CVA, HTN, HLD, DM II, Chronic Pain   ?Start Date: 06/14/2021  ?Expected End Date: 03/26/2022  ?Priority: High  ?Note:   ?Current Barriers:  ?Knowledge Deficits related to plan  of care for management of HTN, HLD, DMII, and  Chronic Left Shoulder Pain  ?Care Coordination needs related to Lacks knowledge of community resource: for different medicaid plans and what they offer especially dental care  ?Chronic Disease Management support and education needs related to HTN, HLD, DMII, and  Chronic Left Shoulder Pain ?Non-adherence to prescribed medication regimen ? ?RNCM Clinical Goal(s):  ?Patient will verbalize understanding of plan for management of HTN, HLD, DMII, and Chronic Left Shoulder Pain as evidenced by improved control of all noted diseases ?verbalize basic understanding of HTN, HLD, DMII, and Chronic Left Shoulder Pain disease process and self health management plan as evidenced by documented self monitoring activities of DM, HTN and chronic pain. ?take all medications exactly as prescribed and will call provider for medication related questions as evidenced by reported medication compliance.    ?attend all scheduled medical appointments: 02/13/2022 Colonoscopy; 03/12/2022 Dr Marlou Sa Orthopedic surgery ; as evidenced by attending scheduled appointments        ?demonstrate ongoing adherence to prescribed treatment plan for HTN, HLD,  DMII, and Chronic Left shoulder pain as evidenced by well controlled disease management of noted diseases. ?demonstrate ongoing health management independence as evidenced by well controlled disease managemen

## 2022-01-23 ENCOUNTER — Other Ambulatory Visit: Payer: Self-pay | Admitting: Nurse Practitioner

## 2022-01-23 ENCOUNTER — Telehealth: Payer: Self-pay | Admitting: Nurse Practitioner

## 2022-01-23 MED ORDER — TAMSULOSIN HCL 0.4 MG PO CAPS: 0.8000 mg | ORAL_CAPSULE | Freq: Every day | ORAL | 1 refills | Status: DC

## 2022-01-23 NOTE — Telephone Encounter (Signed)
Flomax & Tylenol #3 refill request ?States pt had a fall last week and is experiencing left shoulder pain and swelling.  ?

## 2022-01-23 NOTE — Telephone Encounter (Signed)
Refilled Flomax. Patient will need to call ortho about shoulder he is already established with them. ?

## 2022-01-25 ENCOUNTER — Encounter: Payer: Medicare Other | Admitting: Orthopedic Surgery

## 2022-02-19 ENCOUNTER — Other Ambulatory Visit: Payer: Self-pay

## 2022-02-19 NOTE — Patient Instructions (Signed)
Visit Information ? ?Mr. Livesay was given information about Medicaid Managed Care team care coordination services as a part of their [place] Medicaid benefit. [name] verbally consented to engagement with the [facility] Managed Care team.  ? ?If you are experiencing a medical emergency, please call 911 or report to your local emergency department or urgent care.  ? ?If you have a non-emergency medical problem during routine business hours, please contact your provider's office and ask to speak with a nurse.  ? ?For questions related to your [facility], please call: [phone] or visit the homepage here: [email or web address] ? ?If you would like to schedule transportation through your  [facility], please call the following number at least 2 days in advance of your appointment: [phone]. ? Rides for urgent appointments can also be made after hours by calling Member Services. ? ?Call the [place] at [phone], at any time, 24 hours a day, 7 days a week. If you are in danger or need immediate medical attention call 911. ? ?If you would like help to quit smoking, call 1-800-QUIT-NOW (918) 137-8461) OR Espa?ol: 1-855-D?jelo-Ya 2073100084) o para m?s informaci?n haga clic aqu? or Text READY to 200-400 to register via text ? ?Mr. Keech - following are the goals we discussed in your visit today:  ? Goals Addressed   ? ?  ?  ?  ?  ? This Visit's Progress  ?  Blood Pressure < 140/90     ?  Timeframe:  Long-Range Goal ?Priority:  High ?Start Date: 06/13/2021                       ?Expected End Date:   04/26/2022   ? ?Patient will self administer medications as prescribed ?Patient will attend all scheduled provider appointments ?Patient will call pharmacy for medication refills   ?Patient will continue to perform ADL's independently as evidenced by  patient/caregiver report ?Patient will continue to perform IADL's independently as evidenced by patient/caregiver report ?- check blood pressure 3 times per week ?- write blood pressure results in a log or diary ?- keep a blood pressure log ?- take blood pressure log to all doctor appointments ?- take medications for blood pressure as prescribed ?- report new symptoms to your doctor ?- take all medications exactly as prescribed ?- call doctor with any symptoms you believe are related to your medicine ?- call doctor when you experience any new symptoms ?  ?  HLD Management     ?  Timeframe:  Long-Range Goal ?Priority:  High ?Start Date: 06/14/2021       ?Expected End Date:  04/26/2022 ?                 ?Patient will self administer medications as prescribed ?Patient will attend all scheduled provider appointments ?Patient will call pharmacy for medication refills        ?Patient will continue to perform ADL's independently as evidenced by patient/caregiver report ?Patient will continue to perform IADL's independently as evidenced by patient/caregiver report ?Patient will call provider office for new concerns or questions as evidenced by review of documented incoming telephone call notes and patient report ?- keep all doctor appointments ?- take medications for cholesterol as prescribed ?- report new symptoms to your doctor ?- take all medications exactly as prescribed ?- call doctor with any symptoms you believe are related to your medicine ?- call doctor when you experience  any new symptoms ?- go to all doctor appointments as scheduled ?          ?  ?  RNCM Blood sugar monitoring and management     ?  Timeframe:  Long-Range Goal ?Priority:  High ?Start Date:  06/14/2021                       ?Expected End Date:  04/26/2022         ? ?  Patient will self administer medications as prescribed as evidenced by self report/primary caregiver report  ?Patient will attend all scheduled provider appointments as evidenced by  clinician review of documented attendance to scheduled appointments and patient/caregiver report ?Patient will call pharmacy for medication refills as evidenced by patient report and review of pharmacy fill history as appropriate ?Patient will continue to perform ADL's independently as evidenced by patient/caregiver report ?Patient will continue to perform IADL's independently as evidenced by patient/caregiver report ?Patient will call provider office for new concerns or questions as evidenced by review of documented incoming telephone call notes and patient report ?check blood sugar at prescribed times: once or twice a week ?enter blood sugar readings and medication or insulin into daily log ?take the blood sugar log to all doctor visits ?- keep all doctor appointments ?- take medications for diabetes as prescribed ?- report new symptoms to your doctor ?- take all medications exactly as prescribed ?- call doctor with any symptoms you believe are related to your medicine ?- call doctor when you experience any new symptoms ?- go to all doctor appointments as scheduled ?  ? ?  ? ? ?Please see education materials related to today's visit provided as print materials.  ? ?The patient verbalized understanding of instructions, educational materials, and care plan provided today and agreed to receive a mailed copy of patient instructions, educational materials, and care plan.  ? ?The Managed Medicaid care management team will reach out to the patient again over the next 30 days.  ? ?[name], BSN ?Community Care Coordinator ?[facility] ?Mobile: [phone]  ? ?Following is a copy of your plan of care:  ?Care Plan : [place] of Care  ?Updates made by [name], RN since 02/19/2022 12:00 AM  ?  ? ?Problem: Chronic Disease Management and Care Coordination Needs for CVA, HTN, HLD, DM II, Chronic Pain.   ?  ? ?Long-Range Goal: Development of Plan of Care for Chronic  Disease Management and Care Coordination Needs (CVA, HTN, HLD, DM II, Chronic Pain   ?Start Date: 06/14/2021  ?Expected End Date: 03/26/2022  ?Priority: High  ?Note:   ?Current Barriers:  ?Knowledge Deficits related to plan of care for management of HTN, HLD, DMII, and  Chronic Left Shoulder Pain  ?Care Coordination needs related to Lacks knowledge of community resource: for different medicaid plans and what they offer especially dental care  ?Chronic Disease Management support and education needs related to HTN, HLD, DMII, and  Chronic Left Shoulder Pain ?Non-adherence to prescribed medication regimen ? ?RNCM Clinical Goal(s):  ?Patient will verbalize understanding of plan for management of HTN, HLD, DMII, and Chronic Left Shoulder Pain as evidenced by improved control of all noted diseases ?verbalize basic understanding of HTN, HLD, DMII, and Chronic Left Shoulder Pain disease process and self health management plan as evidenced by documented self monitoring activities of DM, HTN and chronic pain. ?take all medications exactly as prescribed and will  call provider for medication related questions as evidenced by reported medication compliance.    ?attend all scheduled medical appointments:  03/12/2022 Dr Marlou Sa Orthopedic surgery; 03/27/2022 Post-Op Visit with Dr. Marlou Sa; as evidenced by attending scheduled appointments        ?demonstrate ongoing adherence to prescribed treatment plan for HTN, HLD, DMII, and Chronic Left shoulder pain as evidenced by well controlled disease management of noted diseases. ?demonstrate ongoing health management independence as evidenced by well controlled disease management of noted diseases.        ?continue to work with Consulting civil engineer and/or Social Worker to address care management and care coordination needs related to HTN, HLD, DMII, and Chronic Left Shoulder pain as evidenced by adherence to CM Team Scheduled appointments     ?work with pharmacist to address Medication procurement and  complex medication regimen related to HTN, HLD, DMII, and Chronic Left Shoulder Pain as evidenced by review of EMR and patient or pharmacist report    ?work with Education officer, museum to address Lacks knowledge of community resou

## 2022-02-19 NOTE — Patient Outreach (Signed)
?Medicaid Managed Care   ?Nurse Care Manager Note ? ?02/19/2022 ?Name:  [name] MRN:  [id number] DOB:  May 09, 1956 ? ?[name] is an 66 y.o. year old male who is [name] primary patient of [name], NP.  The [facility] Managed Care Coordination team was consulted for assistance with:    ?HTN ?HLD ?DMII ?Chronic Left Shoulder Pain ? ?[name] was given information about Medicaid Managed Care Coordination team services today. [name] Patient agreed to services and verbal consent obtained. ? ?Engaged with patient by telephone for follow up visit in response to provider referral for case management and/or care coordination services.  ? ?Assessments/Interventions:  Review of past medical history, allergies, medications, health status, including review of consultants reports, laboratory and other test data, was performed as part of comprehensive evaluation and provision of chronic care management services. ? ?SDOH (Social Determinants of Health) assessments and interventions performed: ? ? ?Care Plan ? ?Allergies  ?Allergen Reactions  ? Lisinopril Other (See Comments)  ?  angioedema  ? Citalopram Nausea Only  ? Morphine Nausea And Vomiting  ? Nsaids Other (See Comments)  ?  Stomach Irritability   ? Paroxetine   ?  Other reaction(s): NAUSEA,VOMITING  ? ? ?Medications Reviewed Today   ? ? Reviewed by [name], RN (Case Manager) on 02/19/22 at 1411  Med List Status: <None>  ? ?Medication Order Taking? Sig Documenting Provider Last Dose Status Informant  ?Acetaminophen-Codeine (TYLENOL/CODEINE #3) 300-30 MG tablet [id number]  Take 1 tablet by mouth every 4 (four) hours as needed for pain. [name], NP  Active   ?amLODipine (NORVASC) 5 MG tablet [id number] No Take 1 tablet (5 mg total) by mouth daily. [name], NP Taking Active   ?atorvastatin (LIPITOR) 40 MG tablet [id number] No Take 1 tablet (40 mg total) by mouth daily. [name], NP Taking Active   ?blood glucose meter kit  and supplies KIT [id number] No Dispense based on patient and insurance preference. Use up to four times daily as directed. [name], NP Taking Active   ?capsaicin (ZOSTRIX) 0.025 % cream [id number] No APPLY SMALL AMOUNT TOPICALLY THREE TIMES [name] DAY AVOID GETTING ON FACE, IN EYES OR ON SENSITIVE AREAS. [place] HANDS THOROUGHLY AFTER USING. [provider] Taking Active   ?clopidogrel (PLAVIX) 75 MG tablet [id number] No Take 1 tablet (75 mg total) by mouth daily. [name], NP Taking Active   ?famotidine (PEPCID) 20 MG tablet [id number] No Take 1 tablet (20 mg total) by mouth 2 (two) times daily. Take one tablet by mouth two times [name] day [name], NP Taking Active   ?folic acid (FOLVITE) 1 MG tablet [id number] No Take 1 tablet (1 mg total) by mouth daily.  ?Patient not taking: Reported on 12/05/2021  ? Brian Cota, MD Not Taking Active   ?         ?Med Note [facility], [name]   Thu Jun 14, 2021 11:03 AM) Patient does not have  ?gabapentin (NEURONTIN) 300 MG capsule [id number] No Take 1 capsule (300 mg total) by mouth 4 (four) times daily. Take three capsules by mouth three times [name] day for tingling/pain [name], NP Taking Active   ?hydrochlorothiazide (MICROZIDE) 12.5 MG capsule [id number] No Take 1 capsule (12.5 mg total) by mouth daily. [name], NP Taking Active   ?metFORMIN (GLUCOPHAGE) 500 MG tablet [id number] No Take 1 tablet (500 mg total) by mouth daily with breakfast.  [name], NP Taking Active   ?metoprolol tartrate (LOPRESSOR) 50 MG tablet [id number] No Take 1 tablet (50 mg total) by mouth 2 (two) times daily. Take one two times [name] day for blood pressure. [name], NP Taking Active   ?Multiple Vitamin (MULTIVITAMIN WITH MINERALS) TABS tablet [id number] No Take 1 tablet by mouth daily.  ?Patient not taking: Reported on 08/17/2021  ? Brian Cota, MD Not Taking Active   ?         ?Med Note [facility], [name]   Thu Jun 14, 2021 11:03 AM) Patient does  not have  ?nicotine polacrilex (NICORETTE) 4 MG gum [id number] No CHEW 1 PIECE OF GUM BY MOUTH AS DIRECTED USE AT LEAST 8-10 PIECES EACH DAY TO START. DO NOT USE MORE THAN 24 PIECES IN [name] DAY. DO NOT EAT OR DRINK FOR 15 MINUTES BEFORE AND DURING USE  ?Patient not taking: Reported on 08/17/2021  ? [provider] Not Taking Active   ?tamsulosin (FLOMAX) 0.4 MG CAPS capsule [id number]  Take 2 capsules (0.8 mg total) by mouth daily. [name], NP  Active   ?thiamine 100 MG tablet [id number] No Take 1 tablet (100 mg total) by mouth daily.  ?Patient not taking: Reported on 08/17/2021  ? Brian Cota, MD Not Taking Active   ?         ?Med Note [facility], [name]   Thu Jun 14, 2021 11:03 AM) Patient does not have  ?traZODone (DESYREL) 100 MG tablet [id number] No Take 1 tablet (100 mg total) by mouth at bedtime. Take one table by mouth at bedtime as needed for sleep. [name], NP Taking Active   ? ?  ?  ? ?  ? ? ?Patient Active Problem List  ? Diagnosis Date Noted  ? Alcoholic intoxication without complication ([place]) 33/82/5053  ? Marijuana use 05/16/2021  ? CVA (cerebral vascular accident) ([place]) 05/14/2021  ? Alcohol abuse 05/14/2021  ? Tobacco abuse 05/14/2021  ? DM2 (diabetes mellitus, type 2) ([place]) 05/14/2021  ? Angioedema 02/09/2017  ? Primary localized osteoarthritis of right hip 01/02/2016  ? Groin rash 06/16/2013  ? DM w/o complication type II ([place]) 06/16/2013  ? BPH (benign prostatic hyperplasia) 11/24/2012  ? Healthcare maintenance 11/24/2012  ? Pancreatitis 10/31/2012  ? Chronic pain syndrome 09/18/2011  ? 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/19/2011  ? Right rotator cuff tear 07/26/2011  ? Shoulder pain, right 05/03/2011  ? Right ankle pain 01/23/2011  ? Left wrist pain 01/23/2011  ? Back pain 01/02/2011  ? MOLLUSCUM CONTAGIOSUM 12/05/2008  ? LEUKOCYTOSIS UNSPECIFIED 03/25/2008  ? LIVER FUNCTION TESTS, ABNORMAL 03/25/2008  ? ERECTILE DYSFUNCTION 05/12/2007  ? Essential hypertension 03/02/2007  ?  Hypercholesteremia 01/08/2007  ? ANXIETY 01/08/2007  ? GERD 01/08/2007  ? Rheumatoid arthritis ([place]) 01/08/2007  ? OSTEOARTHRITIS 01/08/2007  ? ? ?Conditions to be addressed/monitored per PCP order:  HTN, HLD, DMII, and Chronic Left Shoulder Pain ? ?Care Plan : RN Care Manger Plan of Care  ?Updates made by [name], RN since 02/19/2022 12:00 AM  ?  ? ?Problem: Chronic Disease Management and Care Coordination Needs for CVA, HTN, HLD, DM II, Chronic Pain.   ?  ? ?Long-Range Goal: Development of Plan of Care for Chronic Disease Management and Care Coordination Needs (CVA, HTN, HLD, DM II, Chronic Pain   ?Start Date: 06/14/2021  ?Expected End Date: 03/26/2022  ?Priority: High  ?Note:   ?Current Barriers:  ?Knowledge Deficits related to plan of care  for management of HTN, HLD, DMII, and  Chronic Left Shoulder Pain  ?Care Coordination needs related to Lacks knowledge of community resource: for different medicaid plans and what they offer especially dental care  ?Chronic Disease Management support and education needs related to HTN, HLD, DMII, and  Chronic Left Shoulder Pain ?Non-adherence to prescribed medication regimen ? ?RNCM Clinical Goal(s):  ?Patient will verbalize understanding of plan for management of HTN, HLD, DMII, and Chronic Left Shoulder Pain as evidenced by improved control of all noted diseases ?verbalize basic understanding of HTN, HLD, DMII, and Chronic Left Shoulder Pain disease process and self health management plan as evidenced by documented self monitoring activities of DM, HTN and chronic pain. ?take all medications exactly as prescribed and will call provider for medication related questions as evidenced by reported medication compliance.    ?attend all scheduled medical appointments:  03/12/2022 Dr Marlou Sa Orthopedic surgery; 03/27/2022 Post-Op Visit with Dr. Marlou Sa; as evidenced by attending scheduled appointments        ?demonstrate ongoing adherence to prescribed treatment plan for HTN, HLD,  DMII, and Chronic Left shoulder pain as evidenced by well controlled disease management of noted diseases. ?demonstrate ongoing health management independence as evidenced by well controlled disease managemen

## 2022-02-20 ENCOUNTER — Telehealth: Payer: Self-pay | Admitting: Orthopedic Surgery

## 2022-02-20 ENCOUNTER — Telehealth: Payer: Self-pay

## 2022-02-20 NOTE — Telephone Encounter (Signed)
Patients nurse called in stating that the patient wants a call back regarding some questions regarding surgery. Regarding being put under and his risk of having another stroke .  ?He he had a stoke back in July of 2022 and was told by the [place] doctor that if he gets anastasia that he increases the risk of having another one  ? ?He would like a call back just to discuss this further.  ?

## 2022-02-20 NOTE — Telephone Encounter (Signed)
Pt called requesting a call back concerning his surgery. Pt states he had a colonoscopy and was told it might be to soon for surgery. Pt also asking for pain medication. Please call pt about this matter. Pt phone number is [phone]. ?

## 2022-02-26 ENCOUNTER — Telehealth: Payer: Self-pay | Admitting: Orthopedic Surgery

## 2022-02-26 NOTE — Telephone Encounter (Signed)
Patient has possibly re-injured his Left shoulder --was letting dogs out -and was pushed into the fence  ? ?He has surgery coming up --there was no available appointments, so I offered to send a note back  ? ?And someone would call him  ?

## 2022-02-26 NOTE — Telephone Encounter (Signed)
Could you please call patient or advise? He is scheduled for left reverse total shoulder on 03/12/2022. Do we need to try and get him in to office to be seen? ?

## 2022-02-27 NOTE — Telephone Encounter (Signed)
I called patient and got voicemail.  Left voicemail message letting him know that if he has any cuts, abrasions, lacerations over his shoulder that he should come in or if he has severe increased pain or any swelling/bruising that is new then he should also come in.  He will call us back if any of this is the case

## 2022-02-27 NOTE — Telephone Encounter (Signed)
I called and left voicemail, he should call back at some point hopefully

## 2022-02-27 NOTE — Telephone Encounter (Signed)
noted 

## 2022-03-11 NOTE — Progress Notes (Signed)
This Probation officer tried to reach Dr. Marlou Sa office to see if patient will have surgery tomorrow and I left a message for staff to call back PAT. This Probation officer called patient back, and patient verbalized that he will not have surgery tomorrow. Dr. Marlou Sa was notified via [place]. ?

## 2022-03-11 NOTE — Progress Notes (Signed)
Patient was called again at 18:30 o'clock to ask if he decided to have surgery since Dr. Marlou Sa is still considering him for surgery tomorrow. Patient said that he already spoke with Dr. Marlou Sa office staff and informed them that he will not come for surgery tomorrow. Patient said that he will talk with Dr. Marlou Sa about his surgery "when will be the time". OR was notified. ?

## 2022-03-11 NOTE — Progress Notes (Signed)
This Probation officer called patient for questions and instructions for surgery day. Patient verbalized that he is not sure he will have surgery tomorrow because he didn't talk with the doctor about this. Patient verbalized that he tried to reach doctor's office with no result. This Probation officer called doctor Scientist, physiological office and notified PA (Charles, [place])  about this. Per PA, he will call the patient and he will let us know if the patient will come tomorrow for surgery. ?

## 2022-03-12 ENCOUNTER — Ambulatory Visit (HOSPITAL_COMMUNITY): Admission: RE | Admit: 2022-03-12 | Payer: Medicare Other | Source: Home / Self Care | Admitting: Orthopedic Surgery

## 2022-03-12 DIAGNOSIS — Z01818 Encounter for other preprocedural examination: Secondary | ICD-10-CM

## 2022-03-12 SURGERY — ARTHROPLASTY, SHOULDER, TOTAL, REVERSE
Anesthesia: General | Site: Shoulder | Laterality: Left

## 2022-03-19 ENCOUNTER — Other Ambulatory Visit: Payer: Self-pay

## 2022-03-19 NOTE — Patient Outreach (Signed)
Medicaid Managed Care   Nurse Care Manager Note  03/19/2022 Name:  [name] MRN:  [id number] DOB:  03/12/1956  [name] is an 66 y.o. year old male who is a primary patient of Brian Foy, NP.  The [facility] Managed Care Coordination team was consulted for assistance with:    HTN HLD DMII Left Shoulder Pain  [name] was given information about Medicaid Managed Care Coordination team services today. [name] Patient agreed to services and verbal consent obtained.  Engaged with patient by telephone for follow up visit in response to provider referral for case management and/or care coordination services.   Assessments/Interventions:  Review of past medical history, allergies, medications, health status, including review of consultants reports, laboratory and other test data, was performed as part of comprehensive evaluation and provision of chronic care management services.  SDOH (Social Determinants of Health) assessments and interventions performed:   Care Plan  Allergies  Allergen Reactions   Lisinopril Other (See Comments)    angioedema   Citalopram Nausea Only   Morphine Nausea And Vomiting   Nsaids Other (See Comments)    Stomach Irritability    Paroxetine Nausea And Vomiting    Medications Reviewed Today     Reviewed by [name], RN (Case Manager) on 03/19/22 at [place] List Status: <None>   Medication Order Taking? Sig Documenting Provider Last Dose Status Informant  Acetaminophen-Codeine (TYLENOL/CODEINE #3) 300-30 MG tablet [id number] No Take 1 tablet by mouth every 4 (four) hours as needed for pain.  Patient not taking: Reported on 03/06/2022   [name], NP Not Taking Active Self  amLODipine (NORVASC) 5 MG tablet [id number] No Take 1 tablet (5 mg total) by mouth daily. [name], NP Taking Active Self  atorvastatin (LIPITOR) 40 MG tablet [id number] No Take 1 tablet (40 mg total) by mouth daily. [name], NP  Taking Active Self  blood glucose meter kit and supplies KIT [id number] No Dispense based on patient and insurance preference. Use up to four times daily as directed. [name], NP Taking Active Self  capsaicin (ZOSTRIX) 0.025 % cream [id number] No Apply 1 application. topically 2 (two) times daily as needed (pain). [provider] Taking Active Self  clopidogrel (PLAVIX) 75 MG tablet [id number] No Take 1 tablet (75 mg total) by mouth daily. [name], NP Taking Active Self  famotidine (PEPCID) 20 MG tablet [id number] No Take 1 tablet (20 mg total) by mouth 2 (two) times daily. Take one tablet by mouth two times a day  Patient taking differently: Take 20 mg by mouth 2 (two) times daily as needed for heartburn or indigestion.   [name], NP Taking Active Self  gabapentin (NEURONTIN) 300 MG capsule [id number] No Take 1 capsule (300 mg total) by mouth 4 (four) times daily. Take three capsules by mouth three times a day for tingling/pain  Patient taking differently: Take 900 mg by mouth 3 (three) times daily. Take three capsules by mouth three times a day for tingling/pain   [name], NP Taking Active Self  hydrochlorothiazide (HYDRODIURIL) 25 MG tablet [id number]  Take 25 mg by mouth daily. [provider]  Active Self  metFORMIN (GLUCOPHAGE) 500 MG tablet [id number] No Take 1 tablet (500 mg total) by mouth daily with breakfast. [name], NP Taking Active Self  metoprolol tartrate (LOPRESSOR) 50 MG tablet [id number] No Take 1 tablet (50 mg total) by mouth 2 (two)  times daily. Take one two times a day for blood pressure. [name], NP Taking Active Self  nicotine polacrilex (NICORETTE) 4 MG gum [id number] No  [provider] Not Taking Active Self  tamsulosin (FLOMAX) 0.4 MG CAPS capsule [id number]  Take 2 capsules (0.8 mg total) by mouth daily. Brian Foy, NP  Active Self  traZODone (DESYREL) 100 MG tablet [id number] No Take 1 tablet (100 mg  total) by mouth at bedtime. Take one table by mouth at bedtime as needed for sleep.  Patient taking differently: Take 100 mg by mouth at bedtime as needed for sleep. Take one table by mouth at bedtime as needed for sleep.   [name], NP Taking Active Self            Patient Active Problem List   Diagnosis Date Noted   Alcoholic intoxication without complication ([place]) 25/85/2778   Marijuana use 05/16/2021   CVA (cerebral vascular accident) ([place]) 05/14/2021   Alcohol abuse 05/14/2021   Tobacco abuse 05/14/2021   DM2 (diabetes mellitus, type 2) ([place]) 05/14/2021   Angioedema 02/09/2017   Primary localized osteoarthritis of right hip 01/02/2016   Groin rash 24/23/5361   DM w/o complication type II ([place]) 06/16/2013   BPH (benign prostatic hyperplasia) 11/24/2012   Healthcare maintenance 11/24/2012   Pancreatitis 10/31/2012   Chronic pain syndrome 09/18/2011   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/19/2011   Right rotator cuff tear 07/26/2011   Shoulder pain, right 05/03/2011   Right ankle pain 01/23/2011   Left wrist pain 01/23/2011   Back pain 01/02/2011   MOLLUSCUM CONTAGIOSUM 12/05/2008   LEUKOCYTOSIS UNSPECIFIED 03/25/2008   LIVER FUNCTION TESTS, ABNORMAL 03/25/2008   ERECTILE DYSFUNCTION 05/12/2007   Essential hypertension 03/02/2007   Hypercholesteremia 01/08/2007   ANXIETY 01/08/2007   GERD 01/08/2007   Rheumatoid arthritis ([place]) 01/08/2007   OSTEOARTHRITIS 01/08/2007    Conditions to be addressed/monitored per PCP order:  HTN, HLD, DMII, and Left Shoulder Pain  Care Plan : RN Care Manger Plan of Care  Updates made by [name], RN since 03/19/2022 12:00 AM     Problem: Chronic Disease Management and Care Coordination Needs for CVA, HTN, HLD, DM II, Chronic Pain.      Long-Range Goal: Development of Plan of Care for Chronic Disease Management and Care Coordination Needs (CVA, HTN, HLD, DM II, Chronic Pain   Start Date: 06/14/2021  Expected End Date: 06/26/2022   Priority: High  Note:   Current Barriers:  Knowledge Deficits related to plan of care for management of HTN, HLD, DMII, and  Chronic Left Shoulder Pain  Care Coordination needs related to Lacks knowledge of community resource: for different medicaid plans and what they offer especially dental care  Chronic Disease Management support and education needs related to HTN, HLD, DMII, and  Chronic Left Shoulder Pain Non-adherence to prescribed medication regimen  RNCM Clinical Goal(s):  Patient will verbalize understanding of plan for management of HTN, HLD, DMII, and Chronic Left Shoulder Pain as evidenced by improved control of all noted diseases verbalize basic understanding of HTN, HLD, DMII, and Chronic Left Shoulder Pain disease process and self health management plan as evidenced by documented self monitoring activities of DM, HTN and chronic pain. take all medications exactly as prescribed and will call provider for medication related questions as evidenced by reported medication compliance.    attend all scheduled medical appointments:  06/06/2022 New PCP Office Visit; Needs to reschedule shoulder surgery with Dr. Marlou Sa after office visit with  Dr. Marlou Sa (not scheduled at this time); as evidenced by attending scheduled appointments        demonstrate ongoing adherence to prescribed treatment plan for HTN, HLD, DMII, and Chronic Left shoulder pain as evidenced by well controlled disease management of noted diseases. demonstrate ongoing health management independence as evidenced by well controlled disease management of noted diseases.        continue to work with Consulting civil engineer and/or Social Worker to address care management and care coordination needs related to HTN, HLD, DMII, and Chronic Left Shoulder pain as evidenced by adherence to CM Team Scheduled appointments     work with pharmacist to address Medication procurement and complex medication regimen related to HTN, HLD, DMII, and Chronic Left  Shoulder Pain as evidenced by review of EMR and patient or pharmacist report    work with Education officer, museum to address Lacks knowledge of community resource: for different medicaid plans and what they offer especially dental care  related to the management of HTN, HLD, DMII, and Chronic Left Shoulder pain as evidenced by review of EMR and patient or social worker report     through collaboration with Consulting civil engineer, provider, and care team.  BSW contacted patient on 09/18/21 and provided information regarding dental care and audiologist coverage.  This goal met.   Interventions: Inter-disciplinary care team collaboration (see longitudinal plan of care) Evaluation of current treatment plan related to  self management and patient's adherence to plan as established by provider 09/18/21:BSW contacted patient regarding dental and audiology resources. BSW researched and called and no one take Medicaid. BSW informed patient that the most affordable was at [place] located [facility] [employer] and the start off at $199 for 2 aids. Patient stated that he did need dental resources, he stated he would like for the list of resources to mailed to him. BSW will have list mailed to patient.   Diabetes:  (Status: Goal on Track (progressing): YES.) Lab Results  Component Value Date   HGBA1C 6.2 (A) 12/05/2021   HGBA1C 6.2 12/05/2021   HGBA1C 6.2 12/05/2021   HGBA1C 6.2 12/05/2021    Assessed patient's understanding of A1c goal: <7%   Provided education to patient about basic DM disease process and what is the idea range for blood sugar readings: Between 90 and 120. Reviewed medications with patient and discussed importance of medication adherence;        Counseled on importance of regular laboratory monitoring as prescribed;        Discussed plans with patient for ongoing care management follow up and provided patient with direct contact information for care management team;      Reviewed scheduled/upcoming provider appointments  including: See above in [facility] Clinical Goals. Advised patient, providing education and rationale to check blood sugar.   Patient states he received a new glucometer and has been having difficulty reading results and still getting familiar with the monitor.  Patient states he is feeling good and denies any signs of symptoms of hypo or hyperglycemia.                   Hyperlipidemia: (Status: Goal on Track (progressing): YES.) Lab results from [place] Visit on 12/13/2021 CHOLESTEROL, TOTAL 167    100-199          HDL CHOLESTEROL 45    29-71        LDL CHOLESTEROL 98    0-99        TRIGLYCERIDE 220  H 35-200  Lab Results  Component Value Date   CHOL 113 07/05/2021   HDL 49 07/05/2021   LDLCALC 34 07/05/2021   LDLDIRECT 96.5 05/14/2021   TRIG 190 (H) 07/05/2021   CHOLHDL 2.3 07/05/2021  Medication review performed; Provider established cholesterol goals reviewed; Counseled on importance of regular laboratory monitoring as prescribed; Last lab results from [place] visit on 12/13/2021.  Continued review of role and benefits of statin for ASCVD risk reduction; Continued review of importance of limiting foods high in cholesterol;  Continued education on the importance of exercise to reduce cholesterol.  Patient continues to do "a lot of walking" everyday and stays active.     Hypertension:  Goal on Track (progressing) Yes Last practice recorded BP readings:  BP Readings from Last 3 Encounters:  12/05/21 136/77  08/17/21 (!) 148/82  08/15/21 136/82    Most recent eGFR/CrCl:  Lab Results  Component Value Date   EGFR 97 07/05/2021    No components found for: CRCL  Evaluation of current treatment plan related to hypertension self management and patient's adherence to plan as established by provider Reviewed medications with patient and discussed importance of compliance Counseled on the importance of exercise goals with target of 150 minutes per week Discussed plans with patient for ongoing care  management follow up and provided patient with direct contact information for care management team Reviewed scheduled/upcoming provider appointments including: See above in [place]. Patient reports his most recent blood pressure reading at home have been "good".  Patient reports most recent blood pressure at home was 137/87.  He has good medication compliance overall.   Pain: Goal on Track: No Pain assessment performed.  Patient states Left shoulder pain is "the same" and interrupts sleep.  Patient not sleeping well a couple of night/week due to the pain. Medications reviewed.  Patient continues to use OTC Tylenol for pain with minimal reflief. Reviewed provider established plan for pain management Discussed importance of adherence to all scheduled medical appointments Counseled on the importance of reporting any/all new or changed pain symptoms or management strategies to pain management provider Advised patient to report to care team affect of pain on daily activities Reviewed with patient prescribed pharmacological and nonpharmacological pain relief strategies Patient's Left shoulder replacement surgery scheduled for 03/12/2022 was canceled due to patient and surgeon, Dr. Marlou Sa, not being able to discuss patient's questions and concerns prior to the surgery.  Patient has questions regarding risk of anesthesia with increase risk of further strokes and who will be responsible for pain management following surgery.   Patient plans on scheduling an in-person office visit with Dr. Marlou Sa to discuss the [patient's questions and concerns.    Patient Goals/Self-Care Activities: Patient will self administer medications as prescribed as evidenced by self report/primary caregiver report  Patient will attend all scheduled provider appointments as evidenced by clinician review of documented attendance to scheduled appointments and patient/caregiver report Patient will call pharmacy for medication  refills as evidenced by patient report and review of pharmacy fill history as appropriate Patient will continue to perform ADL's independently as evidenced by patient/caregiver report Patient will continue to perform IADL's independently as evidenced by patient/caregiver report Patient will call provider office for new concerns or questions as evidenced by review of documented incoming telephone call notes and patient report check blood sugar at prescribed times: three times daily enter blood sugar readings and medication or insulin into daily log take the blood sugar log to all doctor visits - check blood pressure  3 times per week - write blood pressure results in a log or diary - keep a blood pressure log - take blood pressure log to all doctor appointments - keep all doctor appointments - take medications for blood pressure exactly as prescribed - report new symptoms to your doctor - take all medications exactly as prescribed - call doctor with any symptoms you believe are related to your medicine - call doctor when you experience any new symptoms - go to all doctor appointments as scheduled       Follow Up:  Patient agrees to Care Plan and Follow-up.  Plan: The Managed Medicaid care management team will reach out to the patient again over the next 40 days.  Date/time of next scheduled RN care management/care coordination outreach:  April 23, 2022 at 2:00 PM  [place], [place] [facility] Mobile: [phone]

## 2022-03-19 NOTE — Patient Instructions (Signed)
Visit Information  [name] was given information about Medicaid Managed Care team care coordination services as a part of their [place] Medicaid benefit. [name] verbally consented to engagement with the [facility] Managed Care team.   If you are experiencing a medical emergency, please call 911 or report to your local emergency department or urgent care.   If you have a non-emergency medical problem during routine business hours, please contact your provider's office and ask to speak with a nurse.   For questions related to your [facility], please call: [phone] or visit the homepage here: [email or web address]  If you would like to schedule transportation through your [facility], please call the following number at least 2 days in advance of your appointment: [phone].  Rides for urgent appointments can also be made after hours by calling Member Services.  Call the [place] at [phone], at any time, 24 hours a day, 7 days a week. If you are in danger or need immediate medical attention call 911.  If you would like help to quit smoking, call 1-800-QUIT-NOW 985-755-7781) OR Espaol: 1-855-Djelo-Ya (4-356-861-6837) o para ms informacin haga clic aqu or Text READY to 200-400 to register via text  [name] - following are the goals we discussed in your visit today:   Goals Addressed             This Visit's Progress    Blood Pressure < 140/90       Timeframe:  Long-Range Goal Priority:  High Start Date: 06/13/2021                       Expected End Date:   06/26/2022    Patient will self administer medications as prescribed Patient will attend all scheduled provider appointments Patient will call pharmacy for medication refills   Patient will continue to perform ADL's independently as evidenced by  patient/caregiver report Patient will continue to perform IADL's independently as evidenced by patient/caregiver report - check blood pressure 3 times per week - write blood pressure results in a log or diary - keep a blood pressure log - take blood pressure log to all doctor appointments - take medications for blood pressure as prescribed - report new symptoms to your doctor - take all medications exactly as prescribed - call doctor with any symptoms you believe are related to your medicine - call doctor when you experience any new symptoms     HLD Management       Timeframe:  Long-Range Goal Priority:  High Start Date: 06/14/2021       Expected End Date:  06/26/2022                  Patient will self administer medications as prescribed Patient will attend all scheduled provider appointments Patient will call pharmacy for medication refills        Patient will continue to perform ADL's independently as evidenced by patient/caregiver report Patient will continue to perform IADL's independently as evidenced by patient/caregiver report Patient will call provider office for new concerns or questions as evidenced by review of documented incoming telephone call notes and patient report - keep all doctor appointments - take medications for cholesterol as prescribed - report new symptoms to your doctor - take all medications exactly as prescribed - call doctor with any symptoms you believe are related to your medicine - call doctor when you experience  any new symptoms - go to all doctor appointments as scheduled               RNCM Blood sugar monitoring and management       Timeframe:  Long-Range Goal Priority:  High Start Date:  06/14/2021                       Expected End Date:  06/26/2022            Patient will self administer medications as prescribed as evidenced by self report/primary caregiver report  Patient will attend all scheduled provider appointments as evidenced by  clinician review of documented attendance to scheduled appointments and patient/caregiver report Patient will call pharmacy for medication refills as evidenced by patient report and review of pharmacy fill history as appropriate Patient will continue to perform ADL's independently as evidenced by patient/caregiver report Patient will continue to perform IADL's independently as evidenced by patient/caregiver report Patient will call provider office for new concerns or questions as evidenced by review of documented incoming telephone call notes and patient report check blood sugar at prescribed times: once or twice a week enter blood sugar readings and medication or insulin into daily log take the blood sugar log to all doctor visits - keep all doctor appointments - take medications for diabetes as prescribed - report new symptoms to your doctor - take all medications exactly as prescribed - call doctor with any symptoms you believe are related to your medicine - call doctor when you experience any new symptoms - go to all doctor appointments as scheduled        Please see education materials related to today's visit provided as print materials.   The patient verbalized understanding of instructions, educational materials, and care plan provided today and agreed to receive a mailed copy of patient instructions, educational materials, and care plan.   The Managed Medicaid care management team will reach out to the patient again over the next 40 days.   Brian Marvel RN, BSN Community Care Coordinator [facility] Mobile: [phone]   Following is a copy of your plan of care:  Care Plan : [place] of Care  Updates made by [name], RN since 03/19/2022 12:00 AM     Problem: Chronic Disease Management and Care Coordination Needs for CVA, HTN, HLD, DM II, Chronic Pain.      Long-Range Goal: Development of Plan of Care for Chronic  Disease Management and Care Coordination Needs (CVA, HTN, HLD, DM II, Chronic Pain   Start Date: 06/14/2021  Expected End Date: 06/26/2022  Priority: High  Note:   Current Barriers:  Knowledge Deficits related to plan of care for management of HTN, HLD, DMII, and  Chronic Left Shoulder Pain  Care Coordination needs related to Lacks knowledge of community resource: for different medicaid plans and what they offer especially dental care  Chronic Disease Management support and education needs related to HTN, HLD, DMII, and  Chronic Left Shoulder Pain Non-adherence to prescribed medication regimen  RNCM Clinical Goal(s):  Patient will verbalize understanding of plan for management of HTN, HLD, DMII, and Chronic Left Shoulder Pain as evidenced by improved control of all noted diseases verbalize basic understanding of HTN, HLD, DMII, and Chronic Left Shoulder Pain disease process and self health management plan as evidenced by documented self monitoring activities of DM, HTN and chronic pain. take all medications exactly as prescribed and will  call provider for medication related questions as evidenced by reported medication compliance.    attend all scheduled medical appointments:  06/06/2022 New PCP Office Visit; Needs to reschedule shoulder surgery with Dr. Marlou Sa after office visit with Dr. Marlou Sa (not scheduled at this time); as evidenced by attending scheduled appointments        demonstrate ongoing adherence to prescribed treatment plan for HTN, HLD, DMII, and Chronic Left shoulder pain as evidenced by well controlled disease management of noted diseases. demonstrate ongoing health management independence as evidenced by well controlled disease management of noted diseases.        continue to work with Consulting civil engineer and/or Social Worker to address care management and care coordination needs related to HTN, HLD, DMII, and Chronic Left Shoulder pain as evidenced by adherence to CM Team Scheduled  appointments     work with pharmacist to address Medication procurement and complex medication regimen related to HTN, HLD, DMII, and Chronic Left Shoulder Pain as evidenced by review of EMR and patient or pharmacist report    work with Education officer, museum to address Lacks knowledge of community resource: for different medicaid plans and what they offer especially dental care  related to the management of HTN, HLD, DMII, and Chronic Left Shoulder pain as evidenced by review of EMR and patient or social worker report     through collaboration with Consulting civil engineer, provider, and care team.  BSW contacted patient on 09/18/21 and provided information regarding dental care and audiologist coverage.  This goal met.   Interventions: Inter-disciplinary care team collaboration (see longitudinal plan of care) Evaluation of current treatment plan related to  self management and patient's adherence to plan as established by provider 09/18/21:BSW contacted patient regarding dental and audiology resources. BSW researched and called and no one take Medicaid. BSW informed patient that the most affordable was at [facility] [employer] and the start off at $199 for 2 aids. Patient stated that he did need dental resources, he stated he would like for the list of resources to mailed to him. BSW will have list mailed to patient.   Diabetes:  (Status: Goal on Track (progressing): YES.) Lab Results  Component Value Date   HGBA1C 6.2 (A) 12/05/2021   HGBA1C 6.2 12/05/2021   HGBA1C 6.2 12/05/2021   HGBA1C 6.2 12/05/2021    Assessed patient's understanding of A1c goal: <7%   Provided education to patient about basic DM disease process and what is the idea range for blood sugar readings: Between 90 and 120. Reviewed medications with patient and discussed importance of medication adherence;        Counseled on importance of regular laboratory monitoring as prescribed;        Discussed plans with patient for ongoing care  management follow up and provided patient with direct contact information for care management team;      Reviewed scheduled/upcoming provider appointments including: See above in [facility] Clinical Goals. Advised patient, providing education and rationale to check blood sugar.   Patient states he received a new glucometer and has been having difficulty reading results and still getting familiar with the monitor.  Patient states he is feeling good and denies any signs of symptoms of hypo or hyperglycemia.                   Hyperlipidemia: (Status: Goal on Track (progressing): YES.) Lab results from [place] Visit on 12/13/2021 CHOLESTEROL, TOTAL 167    100-199  HDL CHOLESTEROL 45    29-71        LDL CHOLESTEROL 98    0-99        TRIGLYCERIDE 220  H 35-200    Lab Results  Component Value Date   CHOL 113 07/05/2021   HDL 49 07/05/2021   LDLCALC 34 07/05/2021   LDLDIRECT 96.5 05/14/2021   TRIG 190 (H) 07/05/2021   CHOLHDL 2.3 07/05/2021  Medication review performed; Provider established cholesterol goals reviewed; Counseled on importance of regular laboratory monitoring as prescribed; Last lab results from [place] visit on 12/13/2021.  Continued review of role and benefits of statin for ASCVD risk reduction; Continued review of importance of limiting foods high in cholesterol;  Continued education on the importance of exercise to reduce cholesterol.  Patient continues to do "a lot of walking" everyday and stays active.     Hypertension:  Goal on Track (progressing) Yes Last practice recorded BP readings:  BP Readings from Last 3 Encounters:  12/05/21 136/77  08/17/21 (!) 148/82  08/15/21 136/82    Most recent eGFR/CrCl:  Lab Results  Component Value Date   EGFR 97 07/05/2021    No components found for: CRCL  Evaluation of current treatment plan related to hypertension self management and patient's adherence to plan as established by provider Reviewed medications with patient and  discussed importance of compliance Counseled on the importance of exercise goals with target of 150 minutes per week Discussed plans with patient for ongoing care management follow up and provided patient with direct contact information for care management team Reviewed scheduled/upcoming provider appointments including: See above in [place]. Patient reports his most recent blood pressure reading at home have been "good".  Patient reports most recent blood pressure at home was 137/87.  He has good medication compliance overall.   Pain: Goal on Track: No Pain assessment performed.  Patient states Left shoulder pain is "the same" and interrupts sleep.  Patient not sleeping well a couple of night/week due to the pain. Medications reviewed.  Patient continues to use OTC Tylenol for pain with minimal reflief. Reviewed provider established plan for pain management Discussed importance of adherence to all scheduled medical appointments Counseled on the importance of reporting any/all new or changed pain symptoms or management strategies to pain management provider Advised patient to report to care team affect of pain on daily activities Reviewed with patient prescribed pharmacological and nonpharmacological pain relief strategies Patient's Left shoulder replacement surgery scheduled for 03/12/2022 was canceled due to patient and surgeon, Dr. Marlou Sa, not being able to discuss patient's questions and concerns prior to the surgery.  Patient has questions regarding risk of anesthesia with increase risk of further strokes and who will be responsible for pain management following surgery.   Patient plans on scheduling an in-person office visit with Dr. Marlou Sa to discuss the [patient's questions and concerns.    Patient Goals/Self-Care Activities: Patient will self administer medications as prescribed as evidenced by self report/primary caregiver report  Patient will attend all scheduled provider  appointments as evidenced by clinician review of documented attendance to scheduled appointments and patient/caregiver report Patient will call pharmacy for medication refills as evidenced by patient report and review of pharmacy fill history as appropriate Patient will continue to perform ADL's independently as evidenced by patient/caregiver report Patient will continue to perform IADL's independently as evidenced by patient/caregiver report Patient will call provider office for new concerns or questions as evidenced by review of documented incoming telephone call notes  and patient report check blood sugar at prescribed times: three times daily enter blood sugar readings and medication or insulin into daily log take the blood sugar log to all doctor visits - check blood pressure 3 times per week - write blood pressure results in a log or diary - keep a blood pressure log - take blood pressure log to all doctor appointments - keep all doctor appointments - take medications for blood pressure exactly as prescribed - report new symptoms to your doctor - take all medications exactly as prescribed - call doctor with any symptoms you believe are related to your medicine - call doctor when you experience any new symptoms - go to all doctor appointments as scheduled

## 2022-03-27 ENCOUNTER — Encounter: Payer: Medicare Other | Admitting: Orthopedic Surgery

## 2022-04-23 ENCOUNTER — Ambulatory Visit: Payer: Self-pay

## 2022-05-23 ENCOUNTER — Other Ambulatory Visit: Payer: Self-pay | Admitting: *Deleted

## 2022-05-23 NOTE — Patient Outreach (Signed)
Care Coordination  05/23/2022  KAIUS DAINO 10-23-1956 [id number]  Successful outreach with Mr. Jaysean today. RNCM attempted to explain to Mr. Terri that after reviewing his chart, his insurance has changed. He no longer has Managed Medicaid and that our team will no longer be providing CM services. Mr Landan insist that this happened earlier this year and that this RNCM should check with someone else. RNCM will verify insurance coverage and will reschedule patient's telephone outreach.  Lurena Joiner RN, BSN [place]  [employer] manager

## 2022-05-29 ENCOUNTER — Telehealth: Payer: Self-pay | Admitting: *Deleted

## 2022-05-29 NOTE — Patient Outreach (Signed)
Care Coordination  05/29/2022  [name] June 25, 1956 [id number]  Successful outreach with [name]. RNCM explained that with this Dual Coverage he can contact Customer Service [phone] to be connected with his Case Manager. [name] request that this RNCM make the call to make this connection. [name] did not have time today and request that [facility] call him another day. RNCM made a telephone appointment on 06/12/22 @ 11:15am. Patient agreed to date and time.  Lurena Joiner RN, BSN [place]  [employer] manager

## 2022-06-06 ENCOUNTER — Ambulatory Visit: Payer: Medicare Other | Admitting: Nurse Practitioner

## 2022-06-12 ENCOUNTER — Other Ambulatory Visit: Payer: Self-pay | Admitting: *Deleted

## 2022-06-12 NOTE — Patient Outreach (Signed)
  Medicaid Managed Care   Unsuccessful Attempt Note   06/12/2022 Name: [name] MRN: [id number] DOB: [birth year]  Referred by: [name], NP Reason for referral : High Risk Managed Medicaid (Unsuccessful RNCM follow up telephone outreach)   An unsuccessful telephone outreach was attempted today. The patient was referred to the case management team for assistance with care management and care coordination.    Follow Up Plan: A HIPAA compliant phone message was left for the patient providing contact information and requesting a return call.    Lurena Joiner RN, BSN   [employer] manager

## 2022-06-12 NOTE — Patient Instructions (Signed)
Visit Information  [name]  - as a part of your Medicaid benefit, you are eligible for care management and care coordination services at no cost or copay. I was unable to reach you by phone today but would be happy to help you with your health related needs. Please feel free to call me @ [phone].   A member of the Managed Medicaid care management team will reach out to you again over the next 14 days.   Lurena Joiner RN, BSN [place]  [employer] manager

## 2022-06-18 ENCOUNTER — Other Ambulatory Visit: Payer: Self-pay | Admitting: *Deleted

## 2022-06-18 NOTE — Patient Instructions (Signed)
Visit Information  [name]  - as a part of your Medicaid benefit, you are eligible for care management and care coordination services at no cost or copay. I was unable to reach you by phone today but would be happy to help you with your health related needs. Please feel free to call me @ [phone].   A member of the Managed Medicaid care management team will reach out to you again over the next 14 days.   Lurena Joiner RN, BSN [place]  [employer] manager

## 2022-06-18 NOTE — Patient Outreach (Signed)
  Medicaid Managed Care   Unsuccessful Attempt Note   06/18/2022 Name: BREKEN NAZARI MRN: [id number] DOB: [birth year]  Referred by: [name], NP Reason for referral : High Risk Managed Medicaid (Unsuccessful RNCM follow up telephone outreach)   A second unsuccessful telephone outreach was attempted today. The patient was referred to the case management team for assistance with care management and care coordination.    Follow Up Plan: A HIPAA compliant phone message was left for the patient providing contact information and requesting a return call.    Lurena Joiner RN, BSN [place]  [employer] manager

## 2022-06-20 ENCOUNTER — Ambulatory Visit (INDEPENDENT_AMBULATORY_CARE_PROVIDER_SITE_OTHER): Payer: Medicaid Other | Admitting: Nurse Practitioner

## 2022-06-20 ENCOUNTER — Encounter: Payer: Self-pay | Admitting: Nurse Practitioner

## 2022-06-20 VITALS — BP 171/93 | HR 57 | Temp 97.9°F | Ht 67.0 in | Wt 153.6 lb

## 2022-06-20 DIAGNOSIS — M25511 Pain in right shoulder: Secondary | ICD-10-CM | POA: Diagnosis not present

## 2022-06-20 DIAGNOSIS — E118 Type 2 diabetes mellitus with unspecified complications: Secondary | ICD-10-CM

## 2022-06-20 DIAGNOSIS — R32 Unspecified urinary incontinence: Secondary | ICD-10-CM | POA: Diagnosis not present

## 2022-06-20 DIAGNOSIS — I10 Essential (primary) hypertension: Secondary | ICD-10-CM | POA: Diagnosis not present

## 2022-06-20 LAB — POCT GLYCOSYLATED HEMOGLOBIN (HGB A1C)
HbA1c POC (<> result, manual entry): 5.9 % (ref 4.0–5.6)
HbA1c, POC (controlled diabetic range): 5.9 % (ref 0.0–7.0)
HbA1c, POC (prediabetic range): 5.9 % (ref 5.7–6.4)
Hemoglobin A1C: 5.9 % — AB (ref 4.0–5.6)

## 2022-06-20 MED ORDER — METFORMIN HCL 500 MG PO TABS: 500.0000 mg | ORAL_TABLET | Freq: Every day | ORAL | 3 refills | Status: DC

## 2022-06-20 MED ORDER — FAMOTIDINE 20 MG PO TABS: 20.0000 mg | ORAL_TABLET | Freq: Two times a day (BID) | ORAL | 3 refills | Status: DC

## 2022-06-20 MED ORDER — METOPROLOL TARTRATE 50 MG PO TABS: 50.0000 mg | ORAL_TABLET | Freq: Two times a day (BID) | ORAL | 3 refills | Status: DC

## 2022-06-20 MED ORDER — HYDROCHLOROTHIAZIDE 25 MG PO TABS: 25.0000 mg | ORAL_TABLET | Freq: Every day | ORAL | 2 refills | Status: DC

## 2022-06-20 MED ORDER — TAMSULOSIN HCL 0.4 MG PO CAPS: 0.8000 mg | ORAL_CAPSULE | Freq: Every day | ORAL | 1 refills | Status: DC

## 2022-06-20 MED ORDER — PREDNISONE 20 MG PO TABS: 20.0000 mg | ORAL_TABLET | Freq: Every day | ORAL | 0 refills | Status: DC

## 2022-06-20 MED ORDER — TRAZODONE HCL 100 MG PO TABS: 100.0000 mg | ORAL_TABLET | Freq: Every day | ORAL | 3 refills | Status: DC

## 2022-06-20 MED ORDER — KETOROLAC TROMETHAMINE 30 MG/ML IJ SOLN: 30.0000 mg | Freq: Once | INTRAMUSCULAR | Status: DC

## 2022-06-20 MED ORDER — AMLODIPINE BESYLATE 5 MG PO TABS: 5.0000 mg | ORAL_TABLET | Freq: Every day | ORAL | 3 refills | Status: DC

## 2022-06-20 MED ORDER — ATORVASTATIN CALCIUM 40 MG PO TABS: 40.0000 mg | ORAL_TABLET | Freq: Every day | ORAL | 3 refills | Status: DC

## 2022-06-20 MED ORDER — GABAPENTIN 300 MG PO CAPS: 300.0000 mg | ORAL_CAPSULE | Freq: Four times a day (QID) | ORAL | 3 refills | Status: DC

## 2022-06-20 MED ORDER — TIZANIDINE HCL 4 MG PO TABS: 4.0000 mg | ORAL_TABLET | Freq: Four times a day (QID) | ORAL | 0 refills | Status: DC | PRN

## 2022-06-20 MED ORDER — CLOPIDOGREL BISULFATE 75 MG PO TABS
75.0000 mg | ORAL_TABLET | Freq: Every day | ORAL | 3 refills | Status: DC
Start: 2022-06-20 — End: 2022-09-25

## 2022-06-20 NOTE — Assessment & Plan Note (Signed)
-   POCT glycosylated hemoglobin (Hb A1C) - metFORMIN (GLUCOPHAGE) 500 MG tablet; Take 1 tablet (500 mg total) by mouth daily with breakfast.  Dispense: 90 tablet; Refill: 3 - Lipid Panel - CBC - Comprehensive metabolic panel  2. Acute pain of right shoulder  - ketorolac (TORADOL) 30 MG/ML injection 30 mg - patient refused - tiZANidine (ZANAFLEX) 4 MG tablet; Take 1 tablet (4 mg total) by mouth every 6 (six) hours as needed for muscle spasms.  Dispense: 30 tablet; Refill: 0  Please follow up with ortho for shoulder pain  3. Urinary incontinence, unspecified type  - Ambulatory referral to Urology  4. Essential hypertension  - amLODipine (NORVASC) 5 MG tablet; Take 1 tablet (5 mg total) by mouth daily.  Dispense: 90 tablet; Refill: 3 - Lipid Panel - CBC - Comprehensive metabolic panel  AVS was discussed with patient. Patient was asked if there were any other questions or concerns today. Patient stated there were no other questions or concerns. Patient was advised that they needed lab work and then could check out and schedule follow up visit. Patient voiced understanding. Appointment was completed.    Follow up:  Follow up in 3 months or sooner if needed

## 2022-06-20 NOTE — Progress Notes (Signed)
_0  ID: [name], male    DOB: 1956/02/18, 66 y.o.   MRN: [id number]  Chief Complaint  Patient presents with   Back Pain    Pt states he has sharp upper back pain on the RT side of his back. Pt states its been 3 week's pt is requesting refills on all his medications and pain medication for his back pain    Referring provider: No ref. provider found  HPI  [name] presents for follow up. He  has a past medical history of Alcohol abuse, Allergic rhinitis, Anxiety, Diabetes mellitus without complication ([place]), GERD (gastroesophageal reflux disease), Headache, Hepatitis, Hyperlipidemia, Hypertension, Osteoarthritis, Pancreatitis, Rheumatoid arthritis(714.0), and Tobacco user.   Patient complains of right shoulder pain he is followed by Dr. Nicki Reaper - orho-  for pain of both shoulders. Advised to please call orthopedic office for this issue. Patient was offered steroid injection, oral prednisone, and Toradol injection in office. He refused these medications.   Diabetes:  Patient is compliant with medications. Needs refills on all medications today. Does need blood work today. A1C checked in office today 5.9. Denies f/c/s, n/v/d, hemoptysis, PND, leg swelling Denies chest pain or edema      Allergies  Allergen Reactions   Lisinopril Other (See Comments)    angioedema   Citalopram Nausea Only   Morphine Nausea And Vomiting   Nsaids Other (See Comments)    Stomach Irritability    Paroxetine Nausea And Vomiting    Immunization History  Administered Date(s) Administered   Influenza Whole 10/17/2010   Influenza, Seasonal, Injecte, Preservative Fre 10/06/2012, 07/27/2013, 11/23/2014   Influenza,inj,Quad PF,6+ Mos 09/09/2016, 10/01/2017, 11/13/2018   Influenza-Unspecified 10/04/2004, 08/28/2010, 09/25/2011, 10/28/2012   PFIZER(Purple Top)SARS-COV-2 Vaccination 05/17/2020, 06/08/2020   Pneumococcal Polysaccharide-23 01/24/2004, 02/12/2017   Td 10/17/2010   Tdap  05/19/2012, 08/01/2020    Past Medical History:  Diagnosis Date   Alcohol abuse    Allergic rhinitis    Anxiety    With Post traumatic stress disorder   Diabetes mellitus without complication ([place])    'sometime last yr'--type 2   GERD (gastroesophageal reflux disease)    Headache    "like migraines"   Hepatitis    he thinks its hep b or c   Hyperlipidemia    Hypertension    Osteoarthritis    Pancreatitis    Rheumatoid arthritis(714.0)    Tobacco user     Tobacco History: Social History   Tobacco Use  Smoking Status Every Day   Packs/day: 0.40   Years: 25.00   Total pack years: 10.00   Types: Cigarettes   Passive exposure: Current  Smokeless Tobacco Never  Tobacco Comments   Trying to quit smoking.  Currently smoking 1 -2 cigarette per day and is doing "OK" with decrease.   Ready to quit: Not Answered Counseling given: Not Answered Tobacco comments: Trying to quit smoking.  Currently smoking 1 -2 cigarette per day and is doing "OK" with decrease.   Outpatient Encounter Medications as of 06/20/2022  Medication Sig   blood glucose meter kit and supplies KIT Dispense based on patient and insurance preference. Use up to four times daily as directed.   capsaicin (ZOSTRIX) 0.025 % cream Apply 1 application. topically 2 (two) times daily as needed (pain).   tiZANidine (ZANAFLEX) 4 MG tablet Take 1 tablet (4 mg total) by mouth every 6 (six) hours as needed for muscle spasms.   [DISCONTINUED] amLODipine (NORVASC) 5 MG tablet Take 1 tablet (5 mg total)  by mouth daily.   [DISCONTINUED] atorvastatin (LIPITOR) 40 MG tablet Take 1 tablet (40 mg total) by mouth daily.   [DISCONTINUED] clopidogrel (PLAVIX) 75 MG tablet Take 1 tablet (75 mg total) by mouth daily.   [DISCONTINUED] famotidine (PEPCID) 20 MG tablet Take 1 tablet (20 mg total) by mouth 2 (two) times daily. Take one tablet by mouth two times a day (Patient taking differently: Take 20 mg by mouth 2 (two) times daily as  needed for heartburn or indigestion.)   [DISCONTINUED] gabapentin (NEURONTIN) 300 MG capsule Take 1 capsule (300 mg total) by mouth 4 (four) times daily. Take three capsules by mouth three times a day for tingling/pain (Patient taking differently: Take 900 mg by mouth 3 (three) times daily. Take three capsules by mouth three times a day for tingling/pain)   [DISCONTINUED] hydrochlorothiazide (HYDRODIURIL) 25 MG tablet Take 25 mg by mouth daily.   [DISCONTINUED] metFORMIN (GLUCOPHAGE) 500 MG tablet Take 1 tablet (500 mg total) by mouth daily with breakfast.   [DISCONTINUED] metoprolol tartrate (LOPRESSOR) 50 MG tablet Take 1 tablet (50 mg total) by mouth 2 (two) times daily. Take one two times a day for blood pressure.   [DISCONTINUED] predniSONE (DELTASONE) 20 MG tablet Take 1 tablet (20 mg total) by mouth daily with breakfast for 5 days.   [DISCONTINUED] tamsulosin (FLOMAX) 0.4 MG CAPS capsule Take 2 capsules (0.8 mg total) by mouth daily.   [DISCONTINUED] traZODone (DESYREL) 100 MG tablet Take 1 tablet (100 mg total) by mouth at bedtime. Take one table by mouth at bedtime as needed for sleep. (Patient taking differently: Take 100 mg by mouth at bedtime as needed for sleep. Take one table by mouth at bedtime as needed for sleep.)   Acetaminophen-Codeine (TYLENOL/CODEINE #3) 300-30 MG tablet Take 1 tablet by mouth every 4 (four) hours as needed for pain. (Patient not taking: Reported on 03/06/2022)   amLODipine (NORVASC) 5 MG tablet Take 1 tablet (5 mg total) by mouth daily.   atorvastatin (LIPITOR) 40 MG tablet Take 1 tablet (40 mg total) by mouth daily.   clopidogrel (PLAVIX) 75 MG tablet Take 1 tablet (75 mg total) by mouth daily.   famotidine (PEPCID) 20 MG tablet Take 1 tablet (20 mg total) by mouth 2 (two) times daily. Take one tablet by mouth two times a day   gabapentin (NEURONTIN) 300 MG capsule Take 1 capsule (300 mg total) by mouth 4 (four) times daily. Take three capsules by mouth three times  a day for tingling/pain   hydrochlorothiazide (HYDRODIURIL) 25 MG tablet Take 1 tablet (25 mg total) by mouth daily.   metFORMIN (GLUCOPHAGE) 500 MG tablet Take 1 tablet (500 mg total) by mouth daily with breakfast.   metoprolol tartrate (LOPRESSOR) 50 MG tablet Take 1 tablet (50 mg total) by mouth 2 (two) times daily. Take one two times a day for blood pressure.   nicotine polacrilex (NICORETTE) 4 MG gum    tamsulosin (FLOMAX) 0.4 MG CAPS capsule Take 2 capsules (0.8 mg total) by mouth daily.   traZODone (DESYREL) 100 MG tablet Take 1 tablet (100 mg total) by mouth at bedtime. Take one table by mouth at bedtime as needed for sleep.   [DISCONTINUED] ketorolac (TORADOL) 30 MG/ML injection 30 mg    No facility-administered encounter medications on file as of 06/20/2022.     Review of Systems  Review of Systems  Constitutional: Negative.   HENT: Negative.    Cardiovascular: Negative.   Gastrointestinal: Negative.   Musculoskeletal:  Right shoulder pain  Allergic/Immunologic: Negative.   Neurological: Negative.   Psychiatric/Behavioral: Negative.         Physical Exam  BP (!) 171/93 (BP Location: Right Arm, Patient Position: Sitting, Cuff Size: Normal)   Pulse (!) 57   Temp 97.9 F (36.6 C)   Ht _0  (1.702 m)   Wt 153 lb 9.6 oz (69.7 kg)   SpO2 100%   BMI 24.06 kg/m   Wt Readings from Last 5 Encounters:  06/20/22 153 lb 9.6 oz (69.7 kg)  12/05/21 152 lb (68.9 kg)  08/17/21 155 lb 0.2 oz (70.3 kg)  08/15/21 151 lb 4 oz (68.6 kg)  07/05/21 149 lb 0.2 oz (67.6 kg)     Physical Exam Vitals and nursing note reviewed.  Constitutional:      General: He is not in acute distress.    Appearance: He is well-developed.  Cardiovascular:     Rate and Rhythm: Normal rate and regular rhythm.  Pulmonary:     Effort: Pulmonary effort is normal.     Breath sounds: Normal breath sounds.  Musculoskeletal:     Right shoulder: Tenderness present. Decreased range of motion.   Skin:    General: Skin is warm and dry.  Neurological:     Mental Status: He is alert and oriented to person, place, and time.      Lab Results:  CBC    Component Value Date/Time   WBC 10.1 05/15/2021 0902   RBC 4.99 05/15/2021 0902   HGB 16.7 05/15/2021 0902   HCT 47.2 05/15/2021 0902   PLT 211 05/15/2021 0902   MCV 94.6 05/15/2021 0902   MCH 33.5 05/15/2021 0902   MCHC 35.4 05/15/2021 0902   RDW 12.0 05/15/2021 0902   LYMPHSABS 4.2 (H) 05/15/2021 0902   MONOABS 0.8 05/15/2021 0902   EOSABS 0.1 05/15/2021 0902   BASOSABS 0.0 05/15/2021 0902    BMET    Component Value Date/Time   NA 137 12/05/2021 1127   K 3.8 12/05/2021 1127   CL 100 12/05/2021 1127   CO2 25 05/15/2021 0902   GLUCOSE 92 12/05/2021 1127   GLUCOSE 143 (H) 05/15/2021 0902   BUN 11 12/05/2021 1127   CREATININE 0.80 12/05/2021 1127   CREATININE 0.81 02/12/2017 1003   CALCIUM 9.7 12/05/2021 1127   GFRNONAA >60 05/15/2021 0902   GFRNONAA >89 02/12/2017 1003   GFRAA 94 09/28/2020 1511   GFRAA >89 02/12/2017 1003    BNP No results found for: "BNP"  ProBNP No results found for: "PROBNP"  Imaging: No results found.   Assessment & Plan:   Type 2 diabetes mellitus with complication, without long-term current use of insulin (HCC) - POCT glycosylated hemoglobin (Hb A1C) - metFORMIN (GLUCOPHAGE) 500 MG tablet; Take 1 tablet (500 mg total) by mouth daily with breakfast.  Dispense: 90 tablet; Refill: 3 - Lipid Panel - CBC - Comprehensive metabolic panel  2. Acute pain of right shoulder  - ketorolac (TORADOL) 30 MG/ML injection 30 mg - patient refused - tiZANidine (ZANAFLEX) 4 MG tablet; Take 1 tablet (4 mg total) by mouth every 6 (six) hours as needed for muscle spasms.  Dispense: 30 tablet; Refill: 0  Please follow up with ortho for shoulder pain  3. Urinary incontinence, unspecified type  - Ambulatory referral to Urology  4. Essential hypertension  - amLODipine (NORVASC) 5 MG tablet;  Take 1 tablet (5 mg total) by mouth daily.  Dispense: 90 tablet; Refill: 3 - Lipid Panel - CBC -  Comprehensive metabolic panel  AVS was discussed with patient. Patient was asked if there were any other questions or concerns today. Patient stated there were no other questions or concerns. Patient was advised that they needed lab work and then could check out and schedule follow up visit. Patient voiced understanding. Appointment was completed.    Follow up:  Follow up in 3 months or sooner if needed     [name], NP 06/20/2022

## 2022-06-20 NOTE — Patient Instructions (Addendum)
1. Type 2 diabetes mellitus with complication, without long-term current use of insulin (HCC)  - POCT glycosylated hemoglobin (Hb A1C) - metFORMIN (GLUCOPHAGE) 500 MG tablet; Take 1 tablet (500 mg total) by mouth daily with breakfast.  Dispense: 90 tablet; Refill: 3 - Lipid Panel - CBC - Comprehensive metabolic panel  2. Acute pain of right shoulder  - ketorolac (TORADOL) 30 MG/ML injection 30 mg - patient refused - tiZANidine (ZANAFLEX) 4 MG tablet; Take 1 tablet (4 mg total) by mouth every 6 (six) hours as needed for muscle spasms.  Dispense: 30 tablet; Refill: 0  Please follow up with ortho for shoulder pain  3. Urinary incontinence, unspecified type  - Ambulatory referral to Urology  4. Essential hypertension  - amLODipine (NORVASC) 5 MG tablet; Take 1 tablet (5 mg total) by mouth daily.  Dispense: 90 tablet; Refill: 3 - Lipid Panel - CBC - Comprehensive metabolic panel  AVS was discussed with patient. Patient was asked if there were any other questions or concerns today. Patient stated there were no other questions or concerns. Patient was advised that they needed lab work and then could check out and schedule follow up visit. Patient voiced understanding. Appointment was completed.    Follow up:  Follow up in 3 months or sooner if needed

## 2022-06-21 LAB — COMPREHENSIVE METABOLIC PANEL
ALT: 26 IU/L (ref 0–44)
AST: 33 IU/L (ref 0–40)
Albumin/Globulin Ratio: 1.4 (ref 1.2–2.2)
Albumin: 4.6 g/dL (ref 3.9–4.9)
Alkaline Phosphatase: 104 IU/L (ref 44–121)
BUN/Creatinine Ratio: 13 (ref 10–24)
BUN: 12 mg/dL (ref 8–27)
Bilirubin Total: 0.4 mg/dL (ref 0.0–1.2)
CO2: 22 mmol/L (ref 20–29)
Calcium: 10.5 mg/dL — ABNORMAL HIGH (ref 8.6–10.2)
Chloride: 99 mmol/L (ref 96–106)
Creatinine, Ser: 0.89 mg/dL (ref 0.76–1.27)
Globulin, Total: 3.3 g/dL (ref 1.5–4.5)
Glucose: 133 mg/dL — ABNORMAL HIGH (ref 70–99)
Potassium: 4.9 mmol/L (ref 3.5–5.2)
Sodium: 137 mmol/L (ref 134–144)
Total Protein: 7.9 g/dL (ref 6.0–8.5)
eGFR: 95 mL/min/{1.73_m2} (ref >59)

## 2022-06-21 LAB — CBC
Hematocrit: 52.7 % — ABNORMAL HIGH (ref 37.5–51.0)
Hemoglobin: 18.3 g/dL — ABNORMAL HIGH (ref 13.0–17.7)
MCH: 32.7 pg (ref 26.6–33.0)
MCHC: 34.7 g/dL (ref 31.5–35.7)
MCV: 94 fL (ref 79–97)
Platelets: 233 10*3/uL (ref 150–450)
RBC: 5.59 x10E6/uL (ref 4.14–5.80)
RDW: 12.3 % (ref 11.6–15.4)
WBC: 8 10*3/uL (ref 3.4–10.8)

## 2022-06-21 LAB — LIPID PANEL
Chol/HDL Ratio: 3.4 ratio (ref 0.0–5.0)
Cholesterol, Total: 168 mg/dL (ref 100–199)
HDL: 49 mg/dL (ref >39)
LDL Chol Calc (NIH): 81 mg/dL (ref 0–99)
Triglycerides: 227 mg/dL — ABNORMAL HIGH (ref 0–149)
VLDL Cholesterol Cal: 38 mg/dL (ref 5–40)

## 2022-06-25 ENCOUNTER — Telehealth: Payer: Self-pay

## 2022-06-25 ENCOUNTER — Other Ambulatory Visit: Payer: Self-pay | Admitting: *Deleted

## 2022-06-25 NOTE — Telephone Encounter (Signed)
Hey can you call the pt about his ORTHO referral if he has one please!

## 2022-06-25 NOTE — Patient Outreach (Signed)
Care Coordination  06/25/2022  [name] 1956-09-07 [id number]   Successful outreach with [name]. RNCM explained the reason for today's telephone outreach, provide connection with Dual Coverage Case Manager. [name] requested the telephone number and stated that he would make the call and connect with new Case Manager. RNCM provided [name] with [phone]. [name] voiced understanding and expressed appreciation to [facility]. RNCM will close current care plan established by Gwenette Greet, previous RNCM.  Lurena Joiner RN, BSN [place]  [employer] manager

## 2022-06-27 ENCOUNTER — Telehealth: Payer: Self-pay | Admitting: *Deleted

## 2022-06-27 NOTE — Telephone Encounter (Signed)
Requesting refill on Tylenol #3

## 2022-06-28 NOTE — Telephone Encounter (Signed)
I discussed with patient in office at last visit that would need to be refilled through ortho. Does the patient need a referral to pain management? Thanks.

## 2022-07-05 NOTE — Telephone Encounter (Signed)
Spoke with patient and he has not followed up with Dr. Marlou Sa recently. Advised patient to contact ortho office to schedule follow up.  Patient also mentioned cost of his medications, advised him to contact [facility] and [facility] to see what is going on and get explanation for costs. Patient also asked about referral to Uro - contact info for [facility] given to patient.  Nothing further needed at this time.

## 2022-09-17 IMAGING — CT CT SHOULDER*L* W/O CM
1 of 2 series · 9 of 14 positions shown, 12 images · non-contrast
Comparison: None.

CLINICAL DATA: Left shoulder pain, preop arthroplasty

EXAM:
CT OF THE UPPER LEFT EXTREMITY WITHOUT CONTRAST
TECHNIQUE: Multidetector CT imaging of the upper left extremity was performed
according to the standard protocol.
RADIATION DOSE REDUCTION: This exam was performed according to the
departmental dose-optimization program which includes automated
exposure control, adjustment of the mA and/or kV according to
patient size and/or use of iterative reconstruction technique.

[Series 5: thin soft · axial · 0.50mm/px · z∈[-220,-52]mm · 9 of 352 slices shown, 12 images]
[im 36/352  soft-tissue]
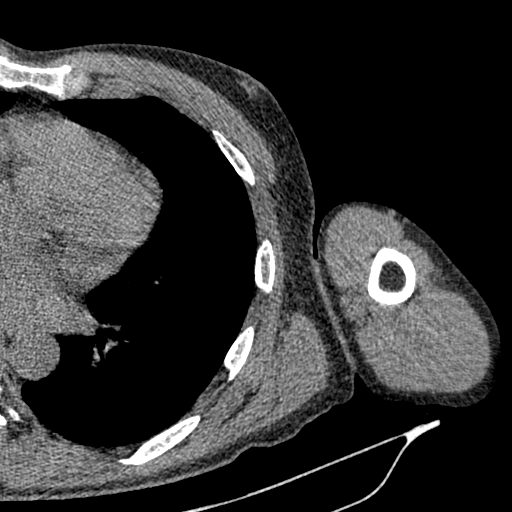
[im 36/352  bone]
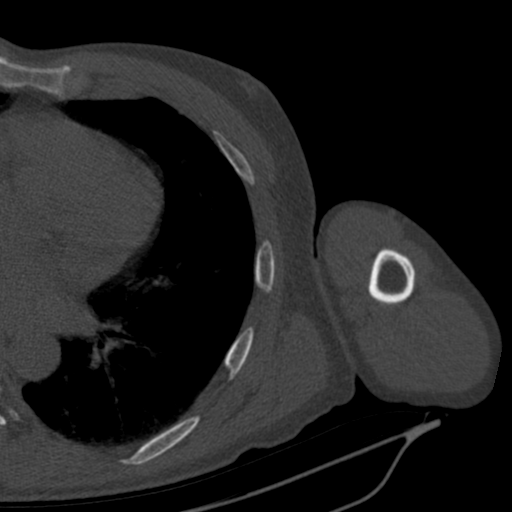
[im 71/352  bone]
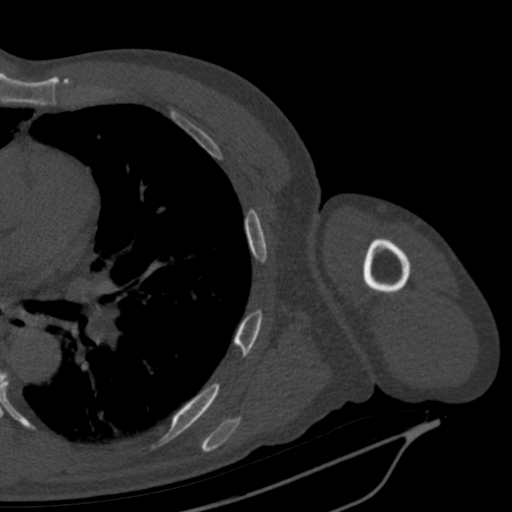
[im 106/352  bone]
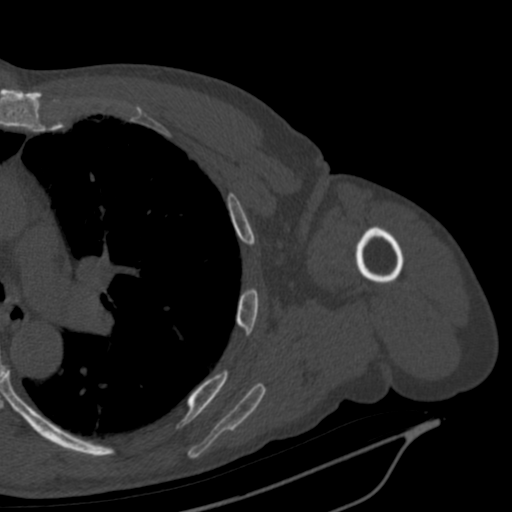
[im 141/352  bone]
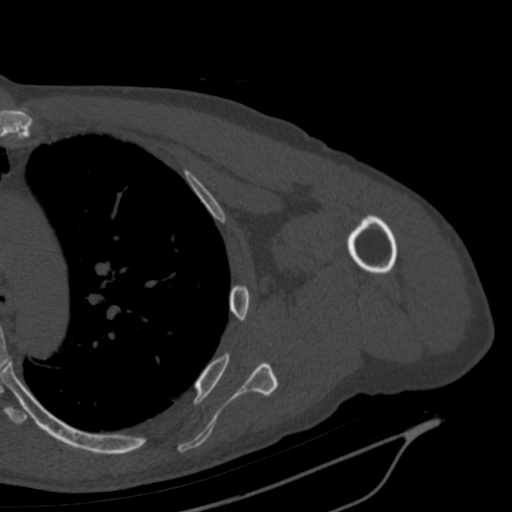
[im 176/352  soft-tissue]
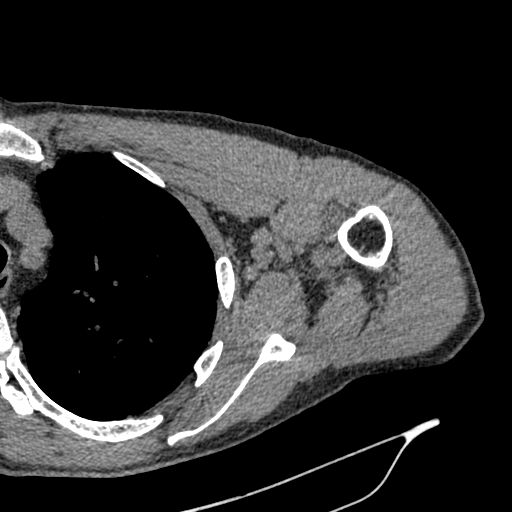
[im 176/352  bone]
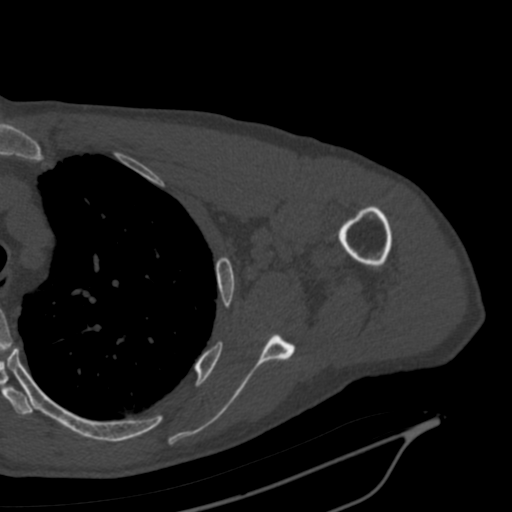
[im 211/352  bone]
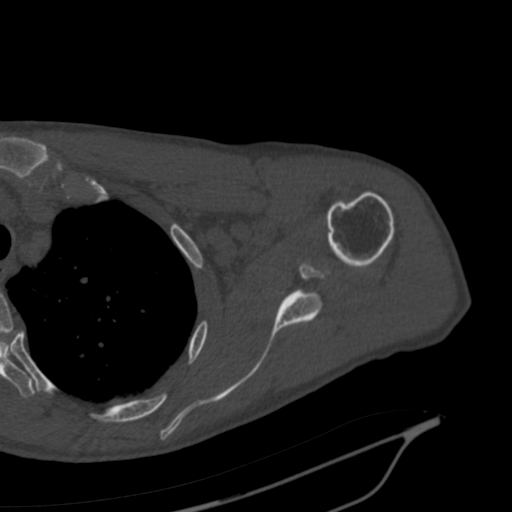
[im 246/352  bone]
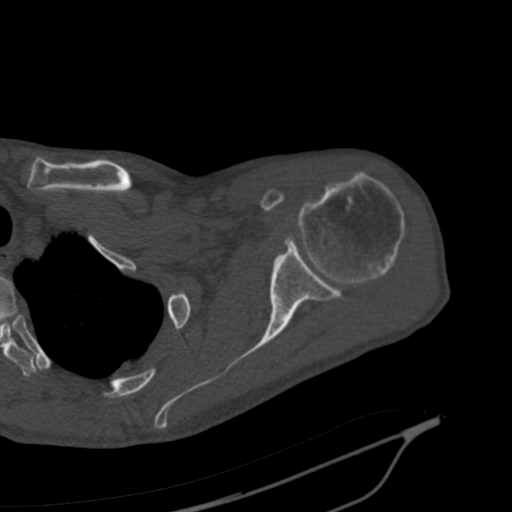
[im 281/352  bone]
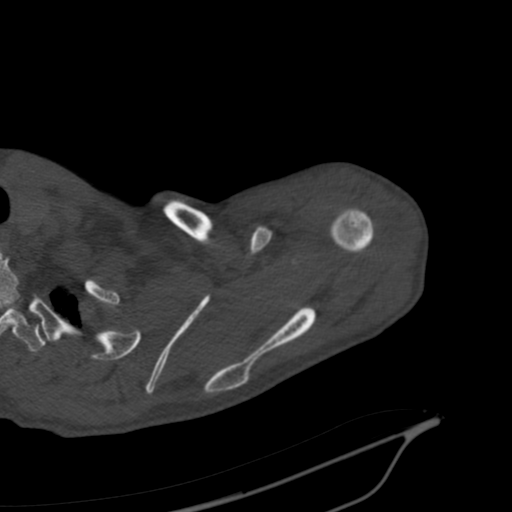
[im 316/352  soft-tissue]
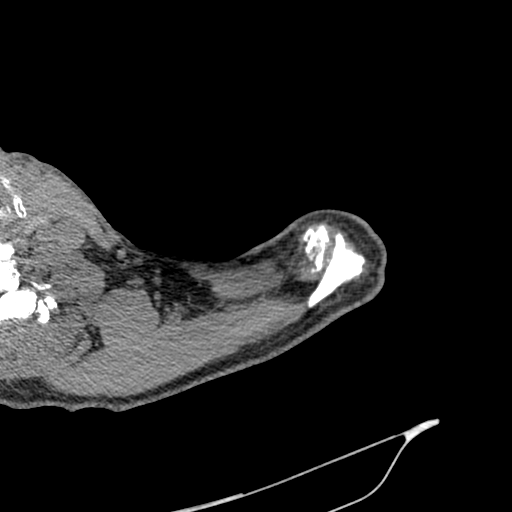
[im 316/352  bone]
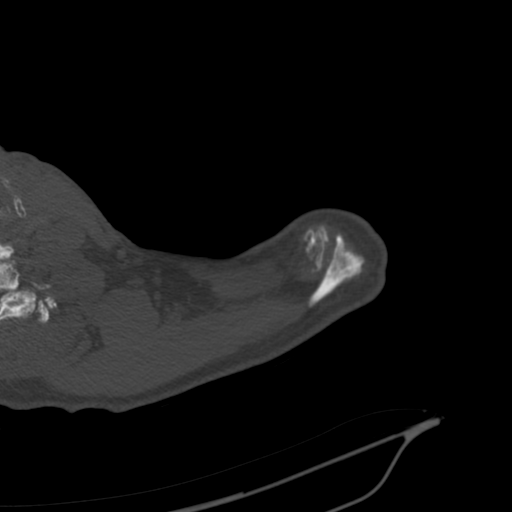

[9 of 14 positions shown; findings below may reference images not displayed]

FINDINGS: Bones/Joint/Cartilage

No fracture or dislocation. Normal alignment. No joint effusion.

Moderate osteoarthritis of the glenohumeral joint.

Mild arthropathy of the acromioclavicular joint.

Small bony fragment along the anterior aspect of the humeral head.

Ligaments

Ligaments are suboptimally evaluated by CT.

Muscles and Tendons
Muscles are normal.  No muscle atrophy.

Soft tissue
No fluid collection or hematoma. No soft tissue mass. Visualized
portions of the lung are clear. Coronary artery atherosclerosis.
IMPRESSION: 1. Moderate osteoarthritis of the left glenohumeral joint.
2. Coronary artery atherosclerosis

## 2022-09-25 ENCOUNTER — Ambulatory Visit: Payer: Medicare Other | Admitting: Nurse Practitioner

## 2022-09-25 ENCOUNTER — Ambulatory Visit (INDEPENDENT_AMBULATORY_CARE_PROVIDER_SITE_OTHER): Payer: Medicare Other | Admitting: Nurse Practitioner

## 2022-09-25 ENCOUNTER — Encounter: Payer: Self-pay | Admitting: Nurse Practitioner

## 2022-09-25 VITALS — BP 118/64 | HR 64 | Ht 67.0 in | Wt 153.0 lb

## 2022-09-25 DIAGNOSIS — M25562 Pain in left knee: Secondary | ICD-10-CM | POA: Diagnosis not present

## 2022-09-25 DIAGNOSIS — E118 Type 2 diabetes mellitus with unspecified complications: Secondary | ICD-10-CM | POA: Diagnosis not present

## 2022-09-25 DIAGNOSIS — B001 Herpesviral vesicular dermatitis: Secondary | ICD-10-CM | POA: Diagnosis not present

## 2022-09-25 DIAGNOSIS — Z1211 Encounter for screening for malignant neoplasm of colon: Secondary | ICD-10-CM

## 2022-09-25 LAB — POCT GLYCOSYLATED HEMOGLOBIN (HGB A1C): Hemoglobin A1C: 5.6 % (ref 4.0–5.6)

## 2022-09-25 MED ORDER — PREDNISONE 20 MG PO TABS: 20.0000 mg | ORAL_TABLET | Freq: Every day | ORAL | 0 refills | Status: AC

## 2022-09-25 MED ORDER — FAMCICLOVIR 500 MG PO TABS: 1500.0000 mg | ORAL_TABLET | Freq: Every day | ORAL | 0 refills | Status: AC

## 2022-09-25 MED ORDER — CAPSAICIN 0.025 % EX CREA: 1.0000 | TOPICAL_CREAM | Freq: Two times a day (BID) | CUTANEOUS | 0 refills | Status: AC | PRN

## 2022-09-25 MED ORDER — KETOROLAC TROMETHAMINE 30 MG/ML IJ SOLN
30.0000 mg | Freq: Once | INTRAMUSCULAR | 0 refills | Status: AC
Start: 2022-09-25 — End: 2022-09-25

## 2022-09-25 MED ORDER — KETOROLAC TROMETHAMINE 30 MG/ML IJ SOLN
30.0000 mg | Freq: Once | INTRAMUSCULAR | Status: AC
  Administered 2022-09-25: 30 mg via INTRAMUSCULAR

## 2022-09-25 MED ORDER — CLOPIDOGREL BISULFATE 75 MG PO TABS: 75.0000 mg | ORAL_TABLET | Freq: Every day | ORAL | 3 refills | Status: AC

## 2022-09-25 MED ORDER — METFORMIN HCL 500 MG PO TABS: 500.0000 mg | ORAL_TABLET | Freq: Every day | ORAL | 3 refills | Status: DC

## 2022-09-25 NOTE — Progress Notes (Signed)
_0  ID: [name], male    DOB: 12/31/1955, 66 y.o.   MRN: [id number]  Chief Complaint  Patient presents with   Diabetes   Hypertension    Referring provider: [name], NP   HPI  [name] presents for follow up. He  has a past medical history of Alcohol abuse, Allergic rhinitis, Anxiety, Diabetes mellitus without complication ([place]), GERD (gastroesophageal reflux disease), Headache, Hepatitis, Hyperlipidemia, Hypertension, Osteoarthritis, Pancreatitis, Rheumatoid arthritis(714.0), and Tobacco user.    Diabetes:   Patient is compliant with medications. Needs refills on all medications today. Does need blood work today. A1C checked in office today 5.6. Denies f/c/s, n/v/d, hemoptysis, PND, leg swelling Denies chest pain or edema  Patient states that he is due for colonoscopy and would like referral back to GI for this.  Will place referral today.  Left knee pain hurting for 3 weeks. Twisted knee while walking dog. Has had swelling.   Patient also complains today of a cold sore to right upper lip which started yesterday.    Allergies  Allergen Reactions   Lisinopril Other (See Comments)    angioedema   Citalopram Nausea Only   Morphine Nausea And Vomiting   Nsaids Other (See Comments)    Stomach Irritability    Paroxetine Nausea And Vomiting    Immunization History  Administered Date(s) Administered   Influenza Whole 10/17/2010   Influenza, Seasonal, Injecte, Preservative Fre 10/06/2012, 07/27/2013, 11/23/2014   Influenza,inj,Quad PF,6+ Mos 09/09/2016, 10/01/2017, 11/13/2018   Influenza-Unspecified 10/04/2004, 08/28/2010, 09/25/2011, 10/28/2012   PFIZER(Purple Top)SARS-COV-2 Vaccination 05/17/2020, 06/08/2020   Pneumococcal Polysaccharide-23 01/24/2004, 02/12/2017   Td 10/17/2010   Tdap 05/19/2012, 08/01/2020    Past Medical History:  Diagnosis Date   Alcohol abuse    Allergic rhinitis    Anxiety    With Post traumatic stress disorder    Diabetes mellitus without complication ([place])    'sometime last yr'--type 2   GERD (gastroesophageal reflux disease)    Headache    "like migraines"   Hepatitis    he thinks its hep b or c   Hyperlipidemia    Hypertension    Osteoarthritis    Pancreatitis    Rheumatoid arthritis(714.0)    Tobacco user     Tobacco History: Social History   Tobacco Use  Smoking Status Every Day   Packs/day: 0.40   Years: 25.00   Total pack years: 10.00   Types: Cigarettes   Passive exposure: Current  Smokeless Tobacco Never  Tobacco Comments   Trying to quit smoking.  Currently smoking 1 -2 cigarette per day and is doing "OK" with decrease.   Ready to quit: Not Answered Counseling given: Not Answered Tobacco comments: Trying to quit smoking.  Currently smoking 1 -2 cigarette per day and is doing "OK" with decrease.   Outpatient Encounter Medications as of 09/25/2022  Medication Sig   amLODipine (NORVASC) 5 MG tablet Take 1 tablet (5 mg total) by mouth daily.   atorvastatin (LIPITOR) 40 MG tablet Take 1 tablet (40 mg total) by mouth daily.   blood glucose meter kit and supplies KIT Dispense based on patient and insurance preference. Use up to four times daily as directed.   famciclovir (FAMVIR) 500 MG tablet Take 3 tablets (1,500 mg total) by mouth daily for 1 day.   famotidine (PEPCID) 20 MG tablet Take 1 tablet (20 mg total) by mouth 2 (two) times daily. Take one tablet by mouth two times a day   gabapentin (  NEURONTIN) 300 MG capsule Take 1 capsule (300 mg total) by mouth 4 (four) times daily. Take three capsules by mouth three times a day for tingling/pain   guaiFENesin (MUCINEX) 600 MG 12 hr tablet Take by mouth 2 (two) times daily.   hydrochlorothiazide (HYDRODIURIL) 25 MG tablet Take 1 tablet (25 mg total) by mouth daily.   ketorolac (TORADOL) 30 MG/ML injection Inject 1 mL (30 mg total) into the muscle once for 1 dose.   metoprolol tartrate (LOPRESSOR) 50 MG tablet Take 1 tablet (50 mg  total) by mouth 2 (two) times daily. Take one two times a day for blood pressure.   predniSONE (DELTASONE) 20 MG tablet Take 1 tablet (20 mg total) by mouth daily with breakfast for 5 days.   tamsulosin (FLOMAX) 0.4 MG CAPS capsule Take 2 capsules (0.8 mg total) by mouth daily.   traZODone (DESYREL) 100 MG tablet Take 1 tablet (100 mg total) by mouth at bedtime. Take one table by mouth at bedtime as needed for sleep.   [DISCONTINUED] capsaicin (ZOSTRIX) 0.025 % cream Apply 1 application. topically 2 (two) times daily as needed (pain).   [DISCONTINUED] metFORMIN (GLUCOPHAGE) 500 MG tablet Take 1 tablet (500 mg total) by mouth daily with breakfast.   Acetaminophen-Codeine (TYLENOL/CODEINE #3) 300-30 MG tablet Take 1 tablet by mouth every 4 (four) hours as needed for pain. (Patient not taking: Reported on 03/06/2022)   capsaicin (ZOSTRIX) 0.025 % cream Apply 1 Application topically 2 (two) times daily as needed (pain).   clopidogrel (PLAVIX) 75 MG tablet Take 1 tablet (75 mg total) by mouth daily.   metFORMIN (GLUCOPHAGE) 500 MG tablet Take 1 tablet (500 mg total) by mouth daily with breakfast.   nicotine polacrilex (NICORETTE) 4 MG gum    tiZANidine (ZANAFLEX) 4 MG tablet Take 1 tablet (4 mg total) by mouth every 6 (six) hours as needed for muscle spasms. (Patient not taking: Reported on 09/25/2022)   [DISCONTINUED] clopidogrel (PLAVIX) 75 MG tablet Take 1 tablet (75 mg total) by mouth daily. (Patient not taking: Reported on 09/25/2022)   [EXPIRED] ketorolac (TORADOL) 30 MG/ML injection 30 mg    No facility-administered encounter medications on file as of 09/25/2022.     Review of Systems  Review of Systems  Constitutional: Negative.   HENT: Negative.    Cardiovascular: Negative.   Gastrointestinal: Negative.   Musculoskeletal:        Left knee pain  Allergic/Immunologic: Negative.   Neurological: Negative.   Psychiatric/Behavioral: Negative.         Physical Exam  BP 118/64    Pulse 64   Ht _0  (1.702 m)   Wt 153 lb (69.4 kg)   SpO2 98%   BMI 23.96 kg/m   Wt Readings from Last 5 Encounters:  09/25/22 153 lb (69.4 kg)  06/20/22 153 lb 9.6 oz (69.7 kg)  12/05/21 152 lb (68.9 kg)  08/17/21 155 lb 0.2 oz (70.3 kg)  08/15/21 151 lb 4 oz (68.6 kg)     Physical Exam Vitals and nursing note reviewed.  Constitutional:      General: He is not in acute distress.    Appearance: He is well-developed.  HENT:     Mouth/Throat:      Comments: Cold sore noted Cardiovascular:     Rate and Rhythm: Normal rate and regular rhythm.  Pulmonary:     Effort: Pulmonary effort is normal.     Breath sounds: Normal breath sounds.  Musculoskeletal:     Left  knee: Decreased range of motion. Tenderness present over the medial joint line.  Skin:    General: Skin is warm and dry.  Neurological:     Mental Status: He is alert and oriented to person, place, and time.      Lab Results:  CBC    Component Value Date/Time   WBC 8.0 06/20/2022 1038   WBC 10.1 05/15/2021 0902   RBC 5.59 06/20/2022 1038   RBC 4.99 05/15/2021 0902   HGB 18.3 (H) 06/20/2022 1038   HCT 52.7 (H) 06/20/2022 1038   PLT 233 06/20/2022 1038   MCV 94 06/20/2022 1038   MCH 32.7 06/20/2022 1038   MCH 33.5 05/15/2021 0902   MCHC 34.7 06/20/2022 1038   MCHC 35.4 05/15/2021 0902   RDW 12.3 06/20/2022 1038   LYMPHSABS 4.2 (H) 05/15/2021 0902   MONOABS 0.8 05/15/2021 0902   EOSABS 0.1 05/15/2021 0902   BASOSABS 0.0 05/15/2021 0902    BMET    Component Value Date/Time   NA 137 06/20/2022 1038   K 4.9 06/20/2022 1038   CL 99 06/20/2022 1038   CO2 22 06/20/2022 1038   GLUCOSE 133 (H) 06/20/2022 1038   GLUCOSE 143 (H) 05/15/2021 0902   BUN 12 06/20/2022 1038   CREATININE 0.89 06/20/2022 1038   CREATININE 0.81 02/12/2017 1003   CALCIUM 10.5 (H) 06/20/2022 1038   GFRNONAA >60 05/15/2021 0902   GFRNONAA >89 02/12/2017 1003   GFRAA 94 09/28/2020 1511   GFRAA >89 02/12/2017 1003     BNP No results found for: "BNP"  ProBNP No results found for: "PROBNP"  Imaging: No results found.   Assessment & Plan:   Type 2 diabetes mellitus with complication, without long-term current use of insulin (HCC) - metFORMIN (GLUCOPHAGE) 500 MG tablet; Take 1 tablet (500 mg total) by mouth daily with breakfast.  Dispense: 90 tablet; Refill: 3 - POCT glycosylated hemoglobin (Hb A1C)  2. Colon cancer screening  - Ambulatory referral to Gastroenterology  3. Acute pain of left knee  - predniSONE (DELTASONE) 20 MG tablet; Take 1 tablet (20 mg total) by mouth daily with breakfast for 5 days.  Dispense: 5 tablet; Refill: 0  Follow up:  Follow up in 3 months     [name], NP 09/25/2022

## 2022-09-25 NOTE — Assessment & Plan Note (Signed)
-   metFORMIN (GLUCOPHAGE) 500 MG tablet; Take 1 tablet (500 mg total) by mouth daily with breakfast.  Dispense: 90 tablet; Refill: 3 - POCT glycosylated hemoglobin (Hb A1C)  2. Colon cancer screening  - Ambulatory referral to Gastroenterology  3. Acute pain of left knee  - predniSONE (DELTASONE) 20 MG tablet; Take 1 tablet (20 mg total) by mouth daily with breakfast for 5 days.  Dispense: 5 tablet; Refill: 0  Follow up:  Follow up in 3 months

## 2022-09-25 NOTE — Patient Instructions (Signed)
1. Type 2 diabetes mellitus with complication, without long-term current use of insulin (HCC)  - metFORMIN (GLUCOPHAGE) 500 MG tablet; Take 1 tablet (500 mg total) by mouth daily with breakfast.  Dispense: 90 tablet; Refill: 3 - POCT glycosylated hemoglobin (Hb A1C)  2. Colon cancer screening  - Ambulatory referral to Gastroenterology  3. Acute pain of left knee  - predniSONE (DELTASONE) 20 MG tablet; Take 1 tablet (20 mg total) by mouth daily with breakfast for 5 days.  Dispense: 5 tablet; Refill: 0  Follow up:  Follow up in 3 months

## 2022-10-10 ENCOUNTER — Telehealth: Payer: Self-pay | Admitting: Nurse Practitioner

## 2022-10-10 NOTE — Telephone Encounter (Signed)
Pt was advised [facility] 

## 2022-10-10 NOTE — Telephone Encounter (Signed)
Caller & Relationship to patient:  MRN #  [id number]   Call Back Number:   Date of Last Office Visit: 09/25/2022     Date of Next Office Visit: 12/26/2022    Medication(s) to be Refilled: Pt has some question about shots (left mess on v/m)  Preferred Pharmacy:   ** Please notify patient to allow 48-72 hours to process** **Let patient know to contact pharmacy at the end of the day to make sure medication is ready. ** **If patient has not been seen in a year or longer, book an appointment **Advise to use MyChart for refill requests OR to contact their pharmacy

## 2022-10-11 ENCOUNTER — Telehealth: Payer: Self-pay | Admitting: Nurse Practitioner

## 2022-10-11 NOTE — Telephone Encounter (Signed)
Done Kh 

## 2022-10-11 NOTE — Telephone Encounter (Signed)
Pt called to inform you he took the RSV  and flu shot this morning.

## 2022-12-05 ENCOUNTER — Telehealth: Payer: Self-pay

## 2022-12-05 NOTE — Telephone Encounter (Signed)
Unsuccessful attempt to reach patient x3 on preferred number listed in notes for scheduled AWV. Left message on voicemail okay to reschedule. 

## 2022-12-26 ENCOUNTER — Ambulatory Visit: Payer: Self-pay | Admitting: Nurse Practitioner

## 2023-02-03 ENCOUNTER — Telehealth: Payer: Self-pay | Admitting: Nurse Practitioner

## 2023-02-03 NOTE — Telephone Encounter (Signed)
Contacted Scheryl Marten to schedule their annual wellness visit. Appointment made for 02/06/2023.  Thank you,  [facility] Support [facility] Direct dial  [phone]

## 2023-02-06 ENCOUNTER — Ambulatory Visit: Payer: 59

## 2023-07-10 ENCOUNTER — Ambulatory Visit (INDEPENDENT_AMBULATORY_CARE_PROVIDER_SITE_OTHER): Payer: 59

## 2023-07-10 VITALS — Ht 67.0 in | Wt 146.0 lb

## 2023-07-10 DIAGNOSIS — Z0100 Encounter for examination of eyes and vision without abnormal findings: Secondary | ICD-10-CM

## 2023-07-10 DIAGNOSIS — Z5941 Food insecurity: Secondary | ICD-10-CM

## 2023-07-10 DIAGNOSIS — Z0000 Encounter for general adult medical examination without abnormal findings: Secondary | ICD-10-CM | POA: Diagnosis not present

## 2023-07-10 DIAGNOSIS — H9193 Unspecified hearing loss, bilateral: Secondary | ICD-10-CM

## 2023-07-10 DIAGNOSIS — Z789 Other specified health status: Secondary | ICD-10-CM

## 2023-07-10 NOTE — Patient Instructions (Signed)
[name] , Thank you for taking time to come for your Medicare Wellness Visit. I appreciate your ongoing commitment to your health goals. Please review the following plan we discussed and let me know if I can assist you in the future.   Referrals/Orders/Follow-Ups/Clinician Recommendations:  A referral has been placed for you to see if there are any additional resources to help you with one of the following:     []   Transportation Needs   [x]   Utility Needs   [x]   Food Insecurity   []  Assistance with daily activities such as bath, dressing, and managing your medications   []  Housing Insecurity If you haven't heard from anyone within the next 7 business days, please call them and let them know a referral has been placed  Concierge Line: [phone]  You have been referred to an audiologist to have a hearing test.If you haven't heard from them in the next week, please call their office to schedule your appointment.   [name] Address: [address], [place], [place] [address] Phone: [phone]  You have been referred [facility] for an eye exam. If you haven't heard from them in the next 7 business days, please call them to schedule your appointment.   [place] [facility] [address] [place] [address] [facility]: [phone]   This is a list of the screening recommended for you and due dates:  Health Maintenance  Topic Date Due   Zoster (Shingles) Vaccine (1 of 2) Never done   Pneumonia Vaccine (2 of 2 - PCV) 02/12/2018   Eye exam for diabetics  11/15/2021   Yearly kidney health urinalysis for diabetes  12/05/2022   Complete foot exam   12/05/2022   Hemoglobin A1C  03/26/2023   Flu Shot  05/29/2023   Yearly kidney function blood test for diabetes  06/21/2023   COVID-19 Vaccine (3 - 2023-24 season) 06/29/2023   Medicare Annual Wellness Visit  07/09/2024   DTaP/Tdap/Td vaccine (6 - Td or Tdap) 12/14/2031    Colon Cancer Screening  01/02/2033   Hepatitis C Screening  Completed   HPV Vaccine  Aged Out    Advanced directives: (ACP Link)Information on Advanced Care Planning can be found at [facility] Advance Health Care Directives Advance Health Care Directives ([email or web address]   Next Medicare Annual Wellness Visit scheduled for next year: Yes  Preventive Care 65 Years and Older, Male Preventive care refers to lifestyle choices and visits with your health care provider that can promote health and wellness. Preventive care visits are also called wellness exams. What can I expect for my preventive care visit? Counseling During your preventive care visit, your health care provider may ask about your: Medical history, including: Past medical problems. Family medical history. History of falls. Current health, including: Emotional well-being. Home life and relationship well-being. Sexual activity. Memory and ability to understand (cognition). Lifestyle, including: Alcohol, nicotine or tobacco, and drug use. Access to firearms. Diet, exercise, and sleep habits. Work and work Astronomer. Sunscreen use. Safety issues such as seatbelt and bike helmet use. Physical exam Your health care provider will check your: Height and weight. These may be used to calculate your BMI (body mass index). BMI is a measurement that tells if you are at a healthy weight. Waist circumference. This measures the distance around your waistline. This measurement also tells if you are at a healthy weight and may help predict  your risk of certain diseases, such as type 2 diabetes and high blood pressure. Heart rate and blood pressure. Body temperature. Skin for abnormal spots. What immunizations do I need?  Vaccines are usually given at various ages, according to a schedule. Your health care provider will recommend vaccines for you based on your age, medical history, and lifestyle or other factors, such  as travel or where you work. What tests do I need? Screening Your health care provider may recommend screening tests for certain conditions. This may include: Lipid and cholesterol levels. Diabetes screening. This is done by checking your blood sugar (glucose) after you have not eaten for a while (fasting). Hepatitis C test. Hepatitis B test. HIV (human immunodeficiency virus) test. STI (sexually transmitted infection) testing, if you are at risk. Lung cancer screening. Colorectal cancer screening. Prostate cancer screening. Abdominal aortic aneurysm (AAA) screening. You may need this if you are a current or former smoker. Talk with your health care provider about your test results, treatment options, and if necessary, the need for more tests. Follow these instructions at home: Eating and drinking  Eat a diet that includes fresh fruits and vegetables, whole grains, lean protein, and low-fat dairy products. Limit your intake of foods with high amounts of sugar, saturated fats, and salt. Take vitamin and mineral supplements as recommended by your health care provider. Do not drink alcohol if your health care provider tells you not to drink. If you drink alcohol: Limit how much you have to 0-2 drinks a day. Know how much alcohol is in your drink. In the [place] one drink equals one 12 oz bottle of beer (355 mL), one 5 oz glass of wine (148 mL), or one 1 oz glass of hard liquor (44 mL). Lifestyle Brush your teeth every morning and night with fluoride toothpaste. Floss one time each day. Exercise for at least 30 minutes 5 or more days each week. Do not use any products that contain nicotine or tobacco. These products include cigarettes, chewing tobacco, and vaping devices, such as e-cigarettes. If you need help quitting, ask your health care provider. Do not use drugs. If you are sexually active, practice safe sex. Use a condom or other form of protection to prevent STIs. Take aspirin only as  told by your health care provider. Make sure that you understand how much to take and what form to take. Work with your health care provider to find out whether it is safe and beneficial for you to take aspirin daily. Ask your health care provider if you need to take a cholesterol-lowering medicine (statin). Find healthy ways to manage stress, such as: Meditation, yoga, or listening to music. Journaling. Talking to a trusted person. Spending time with friends and family. Safety Always wear your seat belt while driving or riding in a vehicle. Do not drive: If you have been drinking alcohol. Do not ride with someone who has been drinking. When you are tired or distracted. While texting. If you have been using any mind-altering substances or drugs. Wear a helmet and other protective equipment during sports activities. If you have firearms in your house, make sure you follow all gun safety procedures. Minimize exposure to UV radiation to reduce your risk of skin cancer. What's next? Visit your health care provider once a year for an annual wellness visit. Ask your health care provider how often you should have your eyes and teeth checked. Stay up to date on all vaccines. This information is not intended to replace  advice given to you by your health care provider. Make sure you discuss any questions you have with your health care provider. Document Revised: 04/11/2021 Document Reviewed: 04/11/2021 Elsevier Patient Education  2024 [employer]. Understanding Your Risk for Falls Millions of people have serious injuries from falls each year. It is important to understand your risk of falling. Talk with your health care provider about your risk and what you can do to lower it. If you do have a serious fall, make sure to tell your provider. Falling once raises your risk of falling again. How can falls affect me? Serious injuries from falls are common. These include: Broken bones, such as hip  fractures. Head injuries, such as traumatic brain injuries (TBI) or concussions. A fear of falling can cause you to avoid activities and stay at home. This can make your muscles weaker and raise your risk for a fall. What can increase my risk? There are a number of risk factors that increase your risk for falling. The more risk factors you have, the higher your risk of falling. Serious injuries from a fall happen most often to people who are older than 66 years old. Teenagers and young adults ages 39-29 are also at higher risk. Common risk factors include: Weakness in the lower body. Being generally weak or confused due to long-term (chronic) illness. Dizziness or balance problems. Poor vision. Medicines that cause dizziness or drowsiness. These may include: Medicines for your blood pressure, heart, anxiety, insomnia, or swelling (edema). Pain medicines. Muscle relaxants. Other risk factors include: Drinking alcohol. Having had a fall in the past. Having foot pain or wearing improper footwear. Working at a dangerous job. Having any of the following in your home: Tripping hazards, such as floor clutter or loose rugs. Poor lighting. Pets. Having dementia or memory loss. What actions can I take to lower my risk of falling?     Physical activity Stay physically fit. Do strength and balance exercises. Consider taking a regular class to build strength and balance. Yoga and tai chi are good options. Vision Have your eyes checked every year and your prescription for glasses or contacts updated as needed. Shoes and walking aids Wear non-skid shoes. Wear shoes that have rubber soles and low heels. Do not wear high heels. Do not walk around the house in socks or slippers. Use a cane or walker as told by your provider. Home safety Attach secure railings on both sides of your stairs. Install grab bars for your bathtub, shower, and toilet. Use a non-skid mat in your bathtub or shower.  Attach bath mats securely with double-sided, non-slip rug tape. Use good lighting in all rooms. Keep a flashlight near your bed. Make sure there is a clear path from your bed to the bathroom. Use night-lights. Do not use throw rugs. Make sure all carpeting is taped or tacked down securely. Remove all clutter from walkways and stairways, including extension cords. Repair uneven or broken steps and floors. Avoid walking on icy or slippery surfaces. Walk on the grass instead of on icy or slick sidewalks. Use ice melter to get rid of ice on walkways in the winter. Use a cordless phone. Questions to ask your health care provider Can you help me check my risk for a fall? Do any of my medicines make me more likely to fall? Should I take a vitamin D supplement? What exercises can I do to improve my strength and balance? Should I make an appointment to have my vision checked?  Do I need a bone density test to check for weak bones (osteoporosis)? Would it help to use a cane or a walker? Where to find more information Centers for Disease Control and Prevention, STEADI: TonerPromos.no Community-Based Fall Prevention Programs: TonerPromos.no [employer] on Aging: BaseRingTones.pl Contact a health care provider if: You fall at home. You are afraid of falling at home. You feel weak, drowsy, or dizzy. This information is not intended to replace advice given to you by your health care provider. Make sure you discuss any questions you have with your health care provider. Document Revised: 06/17/2022 Document Reviewed: 06/17/2022 Elsevier Patient Education  2024 [employer].

## 2023-07-10 NOTE — Progress Notes (Addendum)
 Because this visit was a virtual/telehealth visit,  certain criteria was not obtained, such a blood pressure, CBG if applicable, and timed get up and go. Any medications not marked as "taking" were not mentioned during the medication reconciliation part of the visit. Any vitals not documented were not able to be obtained due to this being a telehealth visit or patient was unable to self-report a recent blood pressure reading due to a lack of equipment at home via telehealth. Vitals that have been documented are verbally provided by the patient.   Subjective:   [name] is a 67 y.o. male who presents for an Initial Medicare Annual Wellness Visit.  Visit Complete: Virtual  I connected with  [name] on 07/10/23 by a audio enabled telemedicine application and verified that I am speaking with the correct person using two identifiers.  Patient Location: Home  Provider Location: Home Office  I discussed the limitations of evaluation and management by telemedicine. The patient expressed understanding and agreed to proceed.  Patient Medicare AWV questionnaire was completed by the patient on na; I have confirmed that all information answered by patient is correct and no changes since this date.  Review of Systems     Cardiac Risk Factors include: advanced age (>12men, >55 women);diabetes mellitus;dyslipidemia;hypertension;smoking/ tobacco exposure;male gender     Objective:    Today's Vitals   07/10/23 0812 07/10/23 0813  Weight: 146 lb (66.2 kg)   Height: 5\' 7"  (1.702 m)   PainSc:  8    Body mass index is 22.87 kg/m.     07/10/2023    8:21 AM 05/14/2021   12:46 PM 03/02/2019   10:32 AM 01/07/2019   11:35 AM 04/18/2017    9:07 AM 02/12/2017    9:01 AM 02/09/2017    2:00 PM  Advanced Directives  Does Patient Have a Medical Advance Directive? No No No No No No No  Would patient like information on creating a medical advance directive? No - Patient declined No - Patient  declined No - Guardian declined No - Patient declined No - Patient declined  No - Patient declined    Current Medications (verified) Outpatient Encounter Medications as of 07/10/2023  Medication Sig   amLODipine (NORVASC) 5 MG tablet Take 1 tablet (5 mg total) by mouth daily.   hydrochlorothiazide (HYDRODIURIL) 25 MG tablet Take 1 tablet (25 mg total) by mouth daily.   metFORMIN (GLUCOPHAGE) 500 MG tablet Take 1 tablet (500 mg total) by mouth daily with breakfast.   metoprolol tartrate (LOPRESSOR) 50 MG tablet Take 1 tablet (50 mg total) by mouth 2 (two) times daily. Take one two times a day for blood pressure.   traZODone (DESYREL) 100 MG tablet Take 1 tablet (100 mg total) by mouth at bedtime. Take one table by mouth at bedtime as needed for sleep.   Acetaminophen-Codeine (TYLENOL/CODEINE #3) 300-30 MG tablet Take 1 tablet by mouth every 4 (four) hours as needed for pain. (Patient not taking: Reported on 03/06/2022)   atorvastatin (LIPITOR) 40 MG tablet Take 1 tablet (40 mg total) by mouth daily. (Patient not taking: Reported on 07/10/2023)   blood glucose meter kit and supplies KIT Dispense based on patient and insurance preference. Use up to four times daily as directed.   clopidogrel (PLAVIX) 75 MG tablet Take 1 tablet (75 mg total) by mouth daily. (Patient not taking: Reported on 07/10/2023)   famotidine (PEPCID) 20 MG tablet Take 1 tablet (20 mg total) by mouth 2 (two)  times daily. Take one tablet by mouth two times a day (Patient not taking: Reported on 07/10/2023)   gabapentin (NEURONTIN) 300 MG capsule Take 1 capsule (300 mg total) by mouth 4 (four) times daily. Take three capsules by mouth three times a day for tingling/pain (Patient not taking: Reported on 07/10/2023)   guaiFENesin (MUCINEX) 600 MG 12 hr tablet Take by mouth 2 (two) times daily. (Patient not taking: Reported on 07/10/2023)   nicotine polacrilex (NICORETTE) 4 MG gum  (Patient not taking: Reported on 07/10/2023)   tamsulosin  (FLOMAX) 0.4 MG CAPS capsule Take 2 capsules (0.8 mg total) by mouth daily. (Patient not taking: Reported on 07/10/2023)   tiZANidine (ZANAFLEX) 4 MG tablet Take 1 tablet (4 mg total) by mouth every 6 (six) hours as needed for muscle spasms. (Patient not taking: Reported on 09/25/2022)   No facility-administered encounter medications on file as of 07/10/2023.    Allergies (verified) Lisinopril, Citalopram, Morphine, Nsaids, and Paroxetine   History: Past Medical History:  Diagnosis Date   Alcohol abuse    Allergic rhinitis    Anxiety    With Post traumatic stress disorder   Diabetes mellitus without complication (HCC)    'sometime last yr'--type 2   GERD (gastroesophageal reflux disease)    Headache    "like migraines"   Hepatitis    he thinks its hep b or c   Hyperlipidemia    Hypertension    Osteoarthritis    Pancreatitis    Rheumatoid arthritis(714.0)    Tobacco user    Past Surgical History:  Procedure Laterality Date   COLONOSCOPY     FRACTURE SURGERY     cheek bone fracture    [facility]     right shoulder  -- 2010   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   Procedure: TOTAL HIP ARTHROPLASTY ANTERIOR APPROACH;  Surgeon: Sheral Apley, MD;  Location: [facility] OR;  Service: Orthopedics;  Laterality: Right;   WRIST SURGERY     fusion with pins  2007   Family History  Problem Relation Age of Onset   Kidney disease Brother    Angioedema Maternal Aunt        Tongue swelling   Heart attack Mother    Heart attack Father    Social History   Socioeconomic History   Marital status: Single    Spouse name: Not on file   Number of children: Not on file   Years of education: Not on file   Highest education level: Not on file  Occupational History   Not on file  Tobacco Use   Smoking status: Every Day    Current packs/day: 0.40    Average packs/day: 0.4 packs/day for 25.0 years (10.0 ttl pk-yrs)    Types: Cigarettes    Passive exposure: Current   Smokeless tobacco: Never    Tobacco comments:    Trying to quit smoking.  Currently smoking 1 -2 cigarette per day and is doing "OK" with decrease.  Vaping Use   Vaping status: Never Used  Substance and Sexual Activity   Alcohol use: Yes    Alcohol/week: 7.0 - 14.0 standard drinks of alcohol    Types: 7 - 14 Cans of beer per week    Comment: 1-2 beers occasionally   Drug use: No   Sexual activity: Yes  Other Topics Concern   Not on file  Social History Narrative   Georganna Skeans. Married- 1 male sexual partner.   Denies drug use but has hx of  crack cocaine, alcohol and tobacco use.   Social Determinants of Health   Financial Resource Strain: Low Risk  (07/10/2023)   Overall Financial Resource Strain (CARDIA)    Difficulty of Paying Living Expenses: Not very hard  Food Insecurity: Food Insecurity Present (07/10/2023)   Hunger Vital Sign    Worried About Running Out of Food in the Last Year: Often true    Ran Out of Food in the Last Year: Often true  Transportation Needs: No Transportation Needs (07/10/2023)   PRAPARE - Administrator, Civil Service (Medical): No    Lack of Transportation (Non-Medical): No  Physical Activity: Sufficiently Active (07/10/2023)   Exercise Vital Sign    Days of Exercise per Week: 7 days    Minutes of Exercise per Session: 30 min  Stress: No Stress Concern Present (07/10/2023)   [employer] of Occupational Health - Occupational Stress Questionnaire    Feeling of Stress : Only a little  Social Connections: Moderately Isolated (07/10/2023)   Social Connection and Isolation Panel [NHANES]    Frequency of Communication with Friends and Family: More than three times a week    Frequency of Social Gatherings with Friends and Family: More than three times a week    Attends Religious Services: More than 4 times per year    Active Member of [employer] or Organizations: No    Attends Banker Meetings: Never    Marital Status: Never married    Tobacco Counseling Ready  to quit: Yes Counseling given: Yes Tobacco comments: Trying to quit smoking.  Currently smoking 1 -2 cigarette per day and is doing "OK" with decrease.   Clinical Intake:  Pre-visit preparation completed: Yes  Pain : 0-10 Pain Score: 8  Pain Type: Chronic pain Pain Location: Shoulder (back) Pain Orientation: Right, Left (LT>RT) Pain Descriptors / Indicators: Throbbing, Sharp Pain Onset: More than a month ago Pain Frequency: Constant     BMI - recorded: 22.87 Nutritional Risks: None Diabetes: Yes CBG done?: No (telehealth visit. unable to obtain cbg) Did pt. bring in CBG monitor from home?: No  How often do you need to have someone help you when you read instructions, pamphlets, or other written materials from your doctor or pharmacy?: 1 - Never  Interpreter Needed?: No  Information entered by ::  Maresa Morash, CMA   Activities of Daily Living    07/10/2023    8:17 AM  In your present state of health, do you have any difficulty performing the following activities:  Hearing? 1  Comment referral placed for audiology  Vision? 0  Comment pt does not have an eye doctor. Referral placed today  Difficulty concentrating or making decisions? 0  Walking or climbing stairs? 1  Comment no difficulty walking but climbing stairs causes hips to hurt more  Dressing or bathing? 1  Comment due to chronic bilateral shoulder pain  Doing errands, shopping? 0  Preparing Food and eating ? Y  Comment chronic bilateral shoulder pain  Using the Toilet? N  In the past six months, have you accidently leaked urine? N  Managing your Medications? N  Managing your Finances? N  Housekeeping or managing your Housekeeping? Y  Comment due to chronic bilateral shoulder pain. hard to lift things    Patient Care Team: Ivonne Andrew, NP as PCP - General (Pulmonary Disease)  Indicate any recent Medical Services you may have received from other than [facility] providers in the past year (date may be  approximate).  Assessment:   This is a routine wellness examination for [name].  Hearing/Vision screen Hearing Screening - Comments:: Has difficulty hearing. Referral placed for audiology  Vision Screening - Comments:: Denies vision difficulties. Referral placed for ophthalmology today    Goals Addressed             This Visit's Progress    Patient Stated       To get my health better       Depression Screen    07/10/2023    8:24 AM 12/05/2021   10:25 AM 07/05/2021   10:55 AM 09/28/2020    2:20 PM 02/10/2019    2:49 PM 06/12/2018    9:10 AM 02/12/2017    8:57 AM  PHQ 2/9 Scores  PHQ - 2 Score 2 0 0 0 0 1 1  PHQ- 9 Score 12        Exception Documentation    Medical reason       Fall Risk    07/10/2023    8:21 AM 06/20/2022    8:29 AM 12/05/2021   10:25 AM 08/17/2021    8:54 AM 07/05/2021   10:55 AM  Fall Risk   Falls in the past year? 1 0 0 0 0  Number falls in past yr: 0 0  0   Injury with Fall? 0 0  0 0  Risk for fall due to : Impaired balance/gait No Fall Risks     Follow up Education provided;Falls prevention discussed Falls evaluation completed       MEDICARE RISK AT HOME: Medicare Risk at Home Any stairs in or around the home?: No If so, are there any without handrails?: No Home free of loose throw rugs in walkways, pet beds, electrical cords, etc?: Yes Adequate lighting in your home to reduce risk of falls?: Yes Life alert?: No Use of a cane, walker or w/c?: No (previously had a cane and someone stole it) Grab bars in the bathroom?: No Shower chair or bench in shower?: No Elevated toilet seat or a handicapped toilet?: No  TIMED UP AND GO:  Was the test performed? No    Cognitive Function:        07/10/2023    8:23 AM  6CIT Screen  What Year? 0 points  What month? 0 points  What time? 0 points  Count back from 20 0 points  Months in reverse 0 points  Repeat phrase 0 points  Total Score 0 points    Immunizations Immunization History   Administered Date(s) Administered   Dtap, Unspecified 07/24/1967   Fluad Quad(high Dose 65+) 12/13/2021   Influenza Whole 10/17/2010   Influenza, Seasonal, Injecte, Preservative Fre 10/06/2012, 07/27/2013, 11/23/2014   Influenza,inj,Quad PF,6+ Mos 09/09/2016, 10/01/2017, 11/13/2018   Influenza-Unspecified 10/04/2004, 08/28/2010, 09/25/2011, 10/28/2012, 10/11/2022   PFIZER(Purple Top)SARS-COV-2 Vaccination 05/17/2020, 06/08/2020   Pneumococcal Polysaccharide-23 01/24/2004, 02/12/2017   Polio, Unspecified 07/24/1967   Respiratory Syncytial Virus Vaccine,Recomb Aduvanted(Arexvy) 10/11/2022   Td 10/17/2010   Td (Adult),5 Lf Tetanus Toxid, Preservative Free 12/13/2021   Tdap 05/19/2012, 08/01/2020    TDAP status: Up to date  Flu Vaccine status: Due, Education has been provided regarding the importance of this vaccine. Advised may receive this vaccine at local pharmacy or Health Dept. Aware to provide a copy of the vaccination record if obtained from local pharmacy or Health Dept. Verbalized acceptance and understanding.  Pneumococcal vaccine status: Due, Education has been provided regarding the importance of this vaccine. Advised may receive this vaccine at local  pharmacy or Health Dept. Aware to provide a copy of the vaccination record if obtained from local pharmacy or Health Dept. Verbalized acceptance and understanding.  Covid-19 vaccine status: Information provided on how to obtain vaccines.   Qualifies for Shingles Vaccine? Yes   Zostavax completed No   Shingrix Completed?: No.    Education has been provided regarding the importance of this vaccine. Patient has been advised to call insurance company to determine out of pocket expense if they have not yet received this vaccine. Advised may also receive vaccine at local pharmacy or Health Dept. Verbalized acceptance and understanding.  Screening Tests Health Maintenance  Topic Date Due   Medicare Annual Wellness (AWV)  Never done    Zoster Vaccines- Shingrix (1 of 2) Never done   Pneumonia Vaccine 71+ Years old (2 of 2 - PCV) 02/12/2018   OPHTHALMOLOGY EXAM  11/15/2021   Diabetic kidney evaluation - Urine ACR  12/05/2022   FOOT EXAM  12/05/2022   HEMOGLOBIN A1C  03/26/2023   INFLUENZA VACCINE  05/29/2023   Diabetic kidney evaluation - eGFR measurement  06/21/2023   COVID-19 Vaccine (3 - 2023-24 season) 06/29/2023   DTaP/Tdap/Td (6 - Td or Tdap) 12/14/2031   Colonoscopy  01/02/2033   Hepatitis C Screening  Completed   HPV VACCINES  Aged Out    Health Maintenance  Health Maintenance Due  Topic Date Due   Medicare Annual Wellness (AWV)  Never done   Zoster Vaccines- Shingrix (1 of 2) Never done   Pneumonia Vaccine 67+ Years old (2 of 2 - PCV) 02/12/2018   OPHTHALMOLOGY EXAM  11/15/2021   Diabetic kidney evaluation - Urine ACR  12/05/2022   FOOT EXAM  12/05/2022   HEMOGLOBIN A1C  03/26/2023   INFLUENZA VACCINE  05/29/2023   Diabetic kidney evaluation - eGFR measurement  06/21/2023   COVID-19 Vaccine (3 - 2023-24 season) 06/29/2023    Colorectal cancer screening: Type of screening: Colonoscopy. Completed 01/03/2023. Repeat every 10 years  Lung Cancer Screening: (Low Dose CT Chest recommended if Age 41-80 years, 20 pack-year currently smoking OR have quit w/in 15years.) does not qualify.   Lung Cancer Screening Referral: na  Additional Screening:  Hepatitis C Screening: does not qualify; Completed 02/10/2017  Vision Screening: Recommended annual ophthalmology exams for early detection of glaucoma and other disorders of the eye. Is the patient up to date with their annual eye exam?  No  Who is the provider or what is the name of the office in which the patient attends annual eye exams? Referral placed 07/10/2023 If pt is not established with a provider, would they like to be referred to a provider to establish care? Yes .   Dental Screening: Recommended annual dental exams for proper oral hygiene  Diabetic  Foot Exam: Provider made aware of need for foot exam  Community Resource Referral / Chronic Care Management: CRR required this visit?  Yes   CCM required this visit?  No    Plan:     I have personally reviewed and noted the following in the patient's chart:   Medical and social history Use of alcohol, tobacco or illicit drugs  Current medications and supplements including opioid prescriptions. Patient is not currently taking opioid prescriptions. Functional ability and status Nutritional status Physical activity Advanced directives List of other physicians Hospitalizations, surgeries, and ER visits in previous 12 months Vitals Screenings to include cognitive, depression, and falls Referrals and appointments  In addition, I have reviewed and discussed with patient  certain preventive protocols, quality metrics, and best practice recommendations. A written personalized care plan for preventive services as well as general preventive health recommendations were provided to patient.     Jordan Hawks Angelea Penny, CMA   07/10/2023   After Visit Summary: (Mail) Due to this being a telephonic visit, the after visit summary with patients personalized plan was offered to patient via mail   Nurse Notes: Patient is due for a foot exam to close HM gap   I have reviewed and agree with above annual wellness documentation  Attestation signed by Ivonne Andrew at 07/28/23

## 2023-07-11 ENCOUNTER — Telehealth: Payer: Self-pay

## 2023-07-11 DIAGNOSIS — G894 Chronic pain syndrome: Secondary | ICD-10-CM

## 2023-07-11 NOTE — Telephone Encounter (Signed)
 Patient completed AWV 07/10/23. He would like a prescription for a shower chair due to dizziness. Please call patient to discuss.   Saraya Tirey, CMA  [facility] AWV Team Direct Dial: [phone]

## 2023-07-16 NOTE — Telephone Encounter (Signed)
Place order for cosign and sent to [employer] for help . KH

## 2023-07-23 ENCOUNTER — Ambulatory Visit: Payer: Self-pay | Admitting: Nurse Practitioner

## 2023-07-24 ENCOUNTER — Ambulatory Visit: Payer: Self-pay | Admitting: Nurse Practitioner

## 2023-08-04 ENCOUNTER — Ambulatory Visit: Payer: Self-pay | Admitting: Nurse Practitioner

## 2023-10-14 ENCOUNTER — Telehealth: Payer: Self-pay | Admitting: Nurse Practitioner

## 2023-10-14 NOTE — Telephone Encounter (Signed)
Dr Renaye Rakers will be sending  paper work over for patient needs a hip replacement he would like a call back

## 2023-10-15 ENCOUNTER — Telehealth: Payer: Self-pay | Admitting: Nurse Practitioner

## 2023-10-15 NOTE — Telephone Encounter (Signed)
Form is in pt's media

## 2023-10-15 NOTE — Telephone Encounter (Signed)
Copied from CRM [id number]. Topic: General - Other >> Oct 15, 2023 10:58 AM Gildardo Pounds wrote: Reason for CRM: Patient calling about paperwork for surgery that was sent from [facility] and [place]. Patient needs the paperwork completed so that the surgery can be scheduled. Best callback number is [phone]. Patient states he has called more than once.

## 2023-10-16 NOTE — Telephone Encounter (Signed)
Clearance form in you black folder. Please advise [facility]

## 2023-10-17 NOTE — Telephone Encounter (Signed)
Pt coming in 10/28/23 at 9:20 with [facility] due to [facility] not having anything available that works with his schedule until later in January and he wants to be cleared for surgery as soon as possible

## 2023-10-28 ENCOUNTER — Ambulatory Visit (INDEPENDENT_AMBULATORY_CARE_PROVIDER_SITE_OTHER): Payer: 59 | Admitting: Nurse Practitioner

## 2023-10-28 ENCOUNTER — Encounter: Payer: Self-pay | Admitting: Nurse Practitioner

## 2023-10-28 VITALS — BP 119/59 | HR 66 | Ht 67.0 in | Wt 149.0 lb

## 2023-10-28 DIAGNOSIS — G4700 Insomnia, unspecified: Secondary | ICD-10-CM

## 2023-10-28 DIAGNOSIS — M1612 Unilateral primary osteoarthritis, left hip: Secondary | ICD-10-CM | POA: Insufficient documentation

## 2023-10-28 DIAGNOSIS — Z01818 Encounter for other preprocedural examination: Secondary | ICD-10-CM | POA: Diagnosis not present

## 2023-10-28 DIAGNOSIS — E118 Type 2 diabetes mellitus with unspecified complications: Secondary | ICD-10-CM

## 2023-10-28 DIAGNOSIS — Z720 Tobacco use: Secondary | ICD-10-CM | POA: Diagnosis not present

## 2023-10-28 DIAGNOSIS — I10 Essential (primary) hypertension: Secondary | ICD-10-CM

## 2023-10-28 DIAGNOSIS — E785 Hyperlipidemia, unspecified: Secondary | ICD-10-CM | POA: Insufficient documentation

## 2023-10-28 LAB — POCT GLYCOSYLATED HEMOGLOBIN (HGB A1C): Hemoglobin A1C: 5.7 % — AB (ref 4.0–5.6)

## 2023-10-28 MED ORDER — TRAZODONE HCL 100 MG PO TABS: 100.0000 mg | ORAL_TABLET | Freq: Every day | ORAL | 3 refills | Status: DC

## 2023-10-28 NOTE — Assessment & Plan Note (Signed)
Lab Results  Component Value Date   CHOL 168 06/20/2022   HDL 49 06/20/2022   LDLCALC 81 06/20/2022   LDLDIRECT 96.5 05/14/2021   TRIG 227 (H) 06/20/2022   CHOLHDL 3.4 06/20/2022  Currently on atorvastatin 40 mg daily Checking lipid panel today

## 2023-10-28 NOTE — Patient Instructions (Signed)
 1. Preop examination (Primary)  - CMP14+EGFR - CBC  2. Type 2 diabetes mellitus with complication, without long-term current use of insulin  (HCC)  - Microalbumin/Creatinine Ratio, Urine - POCT glycosylated hemoglobin (Hb A1C) - Ambulatory referral to Ophthalmology  3. Tobacco abuse   4. Primary osteoarthritis of left hip    It is important that you exercise regularly at least 30 minutes 5 times a week as tolerated  Think about what you will eat, plan ahead. Choose  clean, green, fresh or frozen over canned, processed or packaged foods which are more sugary, salty and fatty. 70 to 75% of food eaten should be vegetables and fruit. Three meals at set times with snacks allowed between meals, but they must be fruit or vegetables. Aim to eat over a 12 hour period , example 7 am to 7 pm, and STOP after  your last meal of the day. Drink water,generally about 64 ounces per day, no other drink is as healthy. Fruit juice is best enjoyed in a healthy way, by EATING the fruit.  Thanks for choosing [facility] we consider it a privelige to serve you.

## 2023-10-28 NOTE — Assessment & Plan Note (Addendum)
 Going for left hip replacement surgery EKG obtained shows NSR with sinus arrhythymia rate of 67bpm . Antereseptal infarct , age undetermined, I have no concern for acute ischemia.  Most recent  EF was 60 to 65% in 2022  Patient denies any previous diagnosis of heart disease, this EKG result was compared to previous EKGs noted in the chart on epic on Epic Blood pressure and diabetes is well-controlled

## 2023-10-28 NOTE — Assessment & Plan Note (Signed)
 Lab Results  Component Value Date   HGBA1C 5.7 (A) 10/28/2023  Currently well-controlled on metformin  500 mg daily Continue current medication Patient counseled on low-carb diet Encouraged engage in regular moderate exercise as tolerated at least 150 minutes weekly

## 2023-10-28 NOTE — Progress Notes (Signed)
 Acute Office Visit  Subjective:     Patient ID: [name], male    DOB: 01-Jan-1956, 67 y.o.   MRN: [id number]  Chief Complaint  Patient presents with   Diabetes   Hip Pain   Pre-op Exam    HPI Mr Belland  has a past medical history of Alcohol abuse, Allergic rhinitis, Anxiety, Diabetes mellitus without complication (HCC), GERD (gastroesophageal reflux disease), Headache, Hepatitis, Hyperlipidemia, Hypertension, Osteoarthritis, Pancreatitis, Rheumatoid arthritis(714.0), and Tobacco user. Patient is in today for preop evaluation , going for  left hip surgery , had right hip surgery peviously that went well.  Patient denies any concerns today, taking home medications daily as ordered       Review of Systems  Constitutional:  Negative for appetite change, chills, fatigue and fever.  HENT:  Negative for congestion, postnasal drip, rhinorrhea and sneezing.   Respiratory:  Negative for cough, shortness of breath and wheezing.   Cardiovascular:  Negative for chest pain, palpitations and leg swelling.  Gastrointestinal:  Negative for abdominal pain, constipation, nausea and vomiting.  Genitourinary:  Negative for difficulty urinating, dysuria, flank pain and frequency.  Musculoskeletal:  Positive for arthralgias. Negative for back pain, joint swelling and myalgias.  Skin:  Negative for color change, pallor, rash and wound.  Neurological:  Negative for dizziness, facial asymmetry, weakness, numbness and headaches.  Psychiatric/Behavioral:  Negative for behavioral problems, confusion, self-injury and suicidal ideas.         Objective:    BP (!) 119/59   Pulse 66   Ht 5' 7 (1.702 m)   Wt 149 lb (67.6 kg)   SpO2 100%   BMI 23.34 kg/m    Physical Exam Vitals and nursing note reviewed.  Constitutional:      General: He is not in acute distress.    Appearance: Normal appearance. He is obese. He is not ill-appearing, toxic-appearing or diaphoretic.  HENT:      Mouth/Throat:     Mouth: Mucous membranes are moist.     Pharynx: Oropharynx is clear. No oropharyngeal exudate or posterior oropharyngeal erythema.  Eyes:     General: No scleral icterus.       Right eye: No discharge.        Left eye: No discharge.     Extraocular Movements: Extraocular movements intact.     Conjunctiva/sclera: Conjunctivae normal.  Cardiovascular:     Rate and Rhythm: Normal rate and regular rhythm.     Pulses: Normal pulses.     Heart sounds: Normal heart sounds. No murmur heard.    No friction rub. No gallop.  Pulmonary:     Effort: Pulmonary effort is normal. No respiratory distress.     Breath sounds: Normal breath sounds. No stridor. No wheezing, rhonchi or rales.  Chest:     Chest wall: No tenderness.  Abdominal:     General: There is no distension.     Palpations: Abdomen is soft.     Tenderness: There is no abdominal tenderness. There is no right CVA tenderness, left CVA tenderness or guarding.  Musculoskeletal:        General: No swelling, deformity or signs of injury.     Right lower leg: No edema.     Left lower leg: No edema.     Comments: Patient sitting comfortably in a chair  Skin:    General: Skin is warm and dry.     Capillary Refill: Capillary refill takes less than 2 seconds.  Coloration: Skin is not jaundiced or pale.     Findings: No bruising, erythema or lesion.  Neurological:     Mental Status: He is alert and oriented to person, place, and time.     Motor: No weakness.     Coordination: Coordination normal.     Gait: Gait normal.  Psychiatric:        Mood and Affect: Mood normal.        Behavior: Behavior normal.        Thought Content: Thought content normal.        Judgment: Judgment normal.     Results for orders placed or performed in visit on 10/28/23  POCT glycosylated hemoglobin (Hb A1C)  Result Value Ref Range   Hemoglobin A1C 5.7 (A) 4.0 - 5.6 %   HbA1c POC (<> result, manual entry)     HbA1c, POC (prediabetic  range)     HbA1c, POC (controlled diabetic range)          Assessment & Plan:   Problem List Items Addressed This Visit       Cardiovascular and Mediastinum   Essential hypertension   BP Readings from Last 3 Encounters:  10/28/23 (!) 119/59  09/25/22 118/64  06/20/22 (!) 171/93   HTN Controlled .  On hydrochlorothiazide  25 mg daily, amlodipine  5 mg daily.  Has metoprolol  50 mg twice daily ordered but stated that he has been taking metoprolol  50 mg daily instead of twice daily, has a blood pressure monitor at home and checks his blood pressure sometimes.  Patient encouraged to continue to monitor blood pressure and report hypotension  Discussed DASH diet and dietary sodium restrictions Continue to increase dietary efforts and exercise.           Endocrine   Type 2 diabetes mellitus with complication, without long-term current use of insulin  (HCC)   Lab Results  Component Value Date   HGBA1C 5.7 (A) 10/28/2023  Currently well-controlled on metformin  500 mg daily Continue current medication Patient counseled on low-carb diet Encouraged engage in regular moderate exercise as tolerated at least 150 minutes weekly      Relevant Orders   Microalbumin/Creatinine Ratio, Urine   POCT glycosylated hemoglobin (Hb A1C) (Completed)   Ambulatory referral to Ophthalmology   Lipid panel     Musculoskeletal and Integument   Osteoarthritis of left hip   Has upcoming hip replacement surgery         Other   Tobacco abuse   Smokes about 0.5 pack/day  Asked about quitting: confirms that he/she currently smokes cigarettes Advise to quit smoking: Educated about QUITTING to reduce the risk of cancer, cardio and cerebrovascular disease. Assess willingness: Unwilling to quit at this time, but is working on cutting back. Assist with counseling and pharmacotherapy: Counseled for 5 minutes and literature provided. Arrange for follow up: follow up in 3 months and continue to offer help.        Preop examination - Primary   Going for left hip replacement surgery EKG obtained shows NSR with sinus arrhythymia rate of 67bpm . Antereseptal infarct , age undetermined, I have no concern for acute ischemia.  Most recent  EF was 60 to 65% in 2022  Patient denies any previous diagnosis of heart disease, this EKG result was compared to previous EKGs noted in the chart on epic on Epic Blood pressure and diabetes is well-controlled       Relevant Orders   CMP14+EGFR   CBC   EKG 12-Lead  Hyperlipidemia LDL goal <55   Lab Results  Component Value Date   CHOL 168 06/20/2022   HDL 49 06/20/2022   LDLCALC 81 06/20/2022   LDLDIRECT 96.5 05/14/2021   TRIG 227 (H) 06/20/2022   CHOLHDL 3.4 06/20/2022  Currently on atorvastatin  40 mg daily Checking lipid panel today       Meds ordered this encounter  Medications   traZODone  (DESYREL ) 100 MG tablet    Sig: Take 1 tablet (100 mg total) by mouth at bedtime. Take one table by mouth at bedtime as needed for sleep.    Dispense:  90 tablet    Refill:  3    Return in about 6 months (around 04/26/2024).  Mccayla Shimada R Izreal Kock, FNP

## 2023-10-28 NOTE — Assessment & Plan Note (Signed)
Has upcoming hip replacement surgery

## 2023-10-28 NOTE — Assessment & Plan Note (Signed)
Smokes about 0.5 pack/day  Asked about quitting: confirms that he/she currently smokes cigarettes Advise to quit smoking: Educated about QUITTING to reduce the risk of cancer, cardio and cerebrovascular disease. Assess willingness: Unwilling to quit at this time, but is working on cutting back. Assist with counseling and pharmacotherapy: Counseled for 5 minutes and literature provided. Arrange for follow up: follow up in 3 months and continue to offer help. 

## 2023-10-28 NOTE — Assessment & Plan Note (Signed)
 BP Readings from Last 3 Encounters:  10/28/23 (!) 119/59  09/25/22 118/64  06/20/22 (!) 171/93   HTN Controlled .  On hydrochlorothiazide  25 mg daily, amlodipine  5 mg daily.  Has metoprolol  50 mg twice daily ordered but stated that he has been taking metoprolol  50 mg daily instead of twice daily, has a blood pressure monitor at home and checks his blood pressure sometimes.  Patient encouraged to continue to monitor blood pressure and report hypotension  Discussed DASH diet and dietary sodium restrictions Continue to increase dietary efforts and exercise.

## 2023-10-29 LAB — LIPID PANEL
Chol/HDL Ratio: 2.3 {ratio} (ref 0.0–5.0)
Cholesterol, Total: 117 mg/dL (ref 100–199)
HDL: 50 mg/dL (ref >39)
LDL Chol Calc (NIH): 49 mg/dL (ref 0–99)
Triglycerides: 95 mg/dL (ref 0–149)
VLDL Cholesterol Cal: 18 mg/dL (ref 5–40)

## 2023-10-29 LAB — CBC
Hematocrit: 42.2 % (ref 37.5–51.0)
Hemoglobin: 14.6 g/dL (ref 13.0–17.7)
MCH: 33.6 pg — ABNORMAL HIGH (ref 26.6–33.0)
MCHC: 34.6 g/dL (ref 31.5–35.7)
MCV: 97 fL (ref 79–97)
Platelets: 273 10*3/uL (ref 150–450)
RBC: 4.35 x10E6/uL (ref 4.14–5.80)
RDW: 11.4 % — ABNORMAL LOW (ref 11.6–15.4)
WBC: 7.9 10*3/uL (ref 3.4–10.8)

## 2023-10-29 LAB — CMP14+EGFR
ALT: 34 [IU]/L (ref 0–44)
AST: 61 [IU]/L — ABNORMAL HIGH (ref 0–40)
Albumin: 4 g/dL (ref 3.9–4.9)
Alkaline Phosphatase: 107 [IU]/L (ref 44–121)
BUN/Creatinine Ratio: 7 — ABNORMAL LOW (ref 10–24)
BUN: 5 mg/dL — ABNORMAL LOW (ref 8–27)
Bilirubin Total: 0.3 mg/dL (ref 0.0–1.2)
CO2: 22 mmol/L (ref 20–29)
Calcium: 9.6 mg/dL (ref 8.6–10.2)
Chloride: 99 mmol/L (ref 96–106)
Creatinine, Ser: 0.76 mg/dL (ref 0.76–1.27)
Globulin, Total: 3.4 g/dL (ref 1.5–4.5)
Glucose: 106 mg/dL — ABNORMAL HIGH (ref 70–99)
Potassium: 3.6 mmol/L (ref 3.5–5.2)
Sodium: 137 mmol/L (ref 134–144)
Total Protein: 7.4 g/dL (ref 6.0–8.5)
eGFR: 99 mL/min/{1.73_m2} (ref >59)

## 2023-10-30 LAB — MICROALBUMIN / CREATININE URINE RATIO
Creatinine, Urine: 12.3 mg/dL
Microalb/Creat Ratio: 533 mg/g{creat} — ABNORMAL HIGH (ref 0–29)
Microalbumin, Urine: 65.5 ug/mL

## 2023-11-03 NOTE — Telephone Encounter (Signed)
 error

## 2023-11-12 NOTE — Progress Notes (Addendum)
Anesthesia Review:  PCP: Folashade Paseda LOV 10/28/23 - preop exam  Cardiologist : none  Chest x-ray : EKG : 11/19/23  Echo : 2022  Stress test: Cardiac Cath :  Activity level:  Sleep Study/ can do a flight of stairs without difficutly  CPAP : none  Fasting Blood Sugar :      / Checks Blood Sugar -- times a day:   Blood Thinner/ Instructions /Last Dose: ASA / Instructions/ Last Dose :    Plavix-  last dose on 11/26/23 per pt    DM- type 2- checks glucose weekly per pt  Hgba1c- 10/28/23- 5.7  Metformin- none day of surgery    Pharmacy in to reconcile meds at preop appt.    Smoker    PT called in on 11/20/23 with wanting to update some med info on 11/20/23.  Called pt back and LVMM on 11/20/23 at 1440pm.  Asked for call back to [phone].

## 2023-11-14 NOTE — Patient Instructions (Signed)
SURGICAL WAITING ROOM VISITATION  Patients having surgery or a procedure may have no more than 2 support people in the waiting area - these visitors may rotate.    Children under the age of 41 must have an adult with them who is not the patient.  Due to an increase in RSV and influenza rates and associated hospitalizations, children ages 68 and under may not visit patients in [facility] hospitals.  Visitors with respiratory illnesses are discouraged from visiting and should remain at home.  If the patient needs to stay at the hospital during part of their recovery, the visitor guidelines for inpatient rooms apply. Pre-op nurse will coordinate an appropriate time for 1 support person to accompany patient in pre-op.  This support person may not rotate.    Please refer to the [facility] website for the visitor guidelines for Inpatients (after your surgery is over and you are in a regular room).       Your procedure is scheduled on:  12/02/2023    Report to [facility] Main Entrance    Report to admitting at  0845 AM   Call this number if you have problems the morning of surgery [phone]   Do not eat food :After Midnight.   After Midnight you may have the following liquids until ___ 0815___ Am  DAY OF SURGERY  Water Non-Citrus Juices (without pulp, NO RED-Apple, White grape, White cranberry) Black Coffee (NO MILK/CREAM OR CREAMERS, sugar ok)  Clear Tea (NO MILK/CREAM OR CREAMERS, sugar ok) regular and decaf                             Plain Jell-O (NO RED)                                           Fruit ices (not with fruit pulp, NO RED)                                     Popsicles (NO RED)                                                               Sports drinks like Gatorade (NO RED)                      The day of surgery:  Drink ONE (1) Pre-Surgery Clear Ensure or G2 at  0815AM ( have completed by )  the morning of surgery. Drink in one sitting. Do not sip.   This drink was given to you during your hospital  pre-op appointment visit. Nothing else to drink after completing the  Pre-Surgery Clear Ensure or G2.          If you have questions, please contact your surgeon's office.       Oral Hygiene is also important to reduce your risk of infection.  Remember - BRUSH YOUR TEETH THE MORNING OF SURGERY WITH YOUR REGULAR TOOTHPASTE  DENTURES WILL BE REMOVED PRIOR TO SURGERY PLEASE DO NOT APPLY "Poly grip" OR ADHESIVES!!!   Do NOT smoke after Midnight   Stop all vitamins and herbal supplements 7 days before surgery.   Take these medicines the morning of surgery with A SIP OF WATER:  amlodipine, metoprolol, flomax            Metformin- none day of surgery   DO NOT TAKE ANY ORAL DIABETIC MEDICATIONS DAY OF YOUR SURGERY  Bring CPAP mask and tubing day of surgery.                              You may not have any metal on your body including hair pins, jewelry, and body piercing             Do not wear make-up, lotions, powders, perfumes/cologne, or deodorant  Do not wear nail polish including gel and S&S, artificial/acrylic nails, or any other type of covering on natural nails including finger and toenails. If you have artificial nails, gel coating, etc. that needs to be removed by a nail salon please have this removed prior to surgery or surgery may need to be canceled/ delayed if the surgeon/ anesthesia feels like they are unable to be safely monitored.   Do not shave  48 hours prior to surgery.               Men may shave face and neck.   Do not bring valuables to the hospital. [facility] IS NOT             RESPONSIBLE   FOR VALUABLES.   Contacts, glasses, dentures or bridgework may not be worn into surgery.   Bring small overnight bag day of surgery.   DO NOT BRING YOUR HOME MEDICATIONS TO THE HOSPITAL. PHARMACY WILL DISPENSE MEDICATIONS LISTED ON YOUR MEDICATION LIST TO YOU DURING YOUR  ADMISSION IN THE HOSPITAL!    Patients discharged on the day of surgery will not be allowed to drive home.  Someone NEEDS to stay with you for the first 24 hours after anesthesia.   Special Instructions: Bring a copy of your healthcare power of attorney and living will documents the day of surgery if you haven't scanned them before.              Please read over the following fact sheets you were given: IF YOU HAVE QUESTIONS ABOUT YOUR PRE-OP INSTRUCTIONS PLEASE CALL [phone]   If you received a COVID test during your pre-op visit  it is requested that you wear a mask when out in public, stay away from anyone that may not be feeling well and notify your surgeon if you develop symptoms. If you test positive for Covid or have been in contact with anyone that has tested positive in the last 10 days please notify you surgeon.      Pre-operative 5 CHG Bath Instructions   You can play a key role in reducing the risk of infection after surgery. Your skin needs to be as free of germs as possible. You can reduce the number of germs on your skin by washing with CHG (chlorhexidine gluconate) soap before surgery. CHG is an antiseptic soap that kills germs and continues to kill germs even after washing.   DO NOT use if you have an allergy to chlorhexidine/CHG or antibacterial soaps.  If your skin becomes reddened or irritated, stop using the CHG and notify one of our RNs at [phone].   Please shower with the CHG soap starting 4 days before surgery using the following schedule:     Please keep in mind the following:  DO NOT shave, including legs and underarms, starting the day of your first shower.   You may shave your face at any point before/day of surgery.  Place clean sheets on your bed the day you start using CHG soap. Use a clean washcloth (not used since being washed) for each shower. DO NOT sleep with pets once you start using the CHG.   CHG Shower Instructions:  If you choose to wash  your hair and private area, wash first with your normal shampoo/soap.  After you use shampoo/soap, rinse your hair and body thoroughly to remove shampoo/soap residue.  Turn the water OFF and apply about 3 tablespoons (45 ml) of CHG soap to a CLEAN washcloth.  Apply CHG soap ONLY FROM YOUR NECK DOWN TO YOUR TOES (washing for 3-5 minutes)  DO NOT use CHG soap on face, private areas, open wounds, or sores.  Pay special attention to the area where your surgery is being performed.  If you are having back surgery, having someone wash your back for you may be helpful. Wait 2 minutes after CHG soap is applied, then you may rinse off the CHG soap.  Pat dry with a clean towel  Put on clean clothes/pajamas   If you choose to wear lotion, please use ONLY the CHG-compatible lotions on the back of this paper.     Additional instructions for the day of surgery: DO NOT APPLY any lotions, deodorants, cologne, or perfumes.   Put on clean/comfortable clothes.  Brush your teeth.  Ask your nurse before applying any prescription medications to the skin.      CHG Compatible Lotions   Aveeno Moisturizing lotion  Cetaphil Moisturizing Cream  Cetaphil Moisturizing Lotion  Clairol Herbal Essence Moisturizing Lotion, Dry Skin  Clairol Herbal Essence Moisturizing Lotion, Extra Dry Skin  Clairol Herbal Essence Moisturizing Lotion, Normal Skin  Curel Age Defying Therapeutic Moisturizing Lotion with Alpha Hydroxy  Curel Extreme Care Body Lotion  Curel Soothing Hands Moisturizing Hand Lotion  Curel Therapeutic Moisturizing Cream, Fragrance-Free  Curel Therapeutic Moisturizing Lotion, Fragrance-Free  Curel Therapeutic Moisturizing Lotion, Original Formula  Eucerin Daily Replenishing Lotion  Eucerin Dry Skin Therapy Plus Alpha Hydroxy Crme  Eucerin Dry Skin Therapy Plus Alpha Hydroxy Lotion  Eucerin Original Crme  Eucerin Original Lotion  Eucerin Plus Crme Eucerin Plus Lotion  Eucerin TriLipid Replenishing  Lotion  Keri Anti-Bacterial Hand Lotion  Keri Deep Conditioning Original Lotion Dry Skin Formula Softly Scented  Keri Deep Conditioning Original Lotion, Fragrance Free Sensitive Skin Formula  Keri Lotion Fast Absorbing Fragrance Free Sensitive Skin Formula  Keri Lotion Fast Absorbing Softly Scented Dry Skin Formula  Keri Original Lotion  Keri Skin Renewal Lotion Keri Silky Smooth Lotion  Keri Silky Smooth Sensitive Skin Lotion  Nivea Body Creamy Conditioning Oil  Nivea Body Extra Enriched Teacher, adult education Moisturizing Lotion Nivea Crme  Nivea Skin Firming Lotion  NutraDerm 30 Skin Lotion  NutraDerm Skin Lotion  NutraDerm Therapeutic Skin Cream  NutraDerm Therapeutic Skin Lotion  ProShield Protective Hand Cream  Provon moisturizing lotion

## 2023-11-17 ENCOUNTER — Telehealth: Payer: Self-pay

## 2023-11-17 NOTE — Telephone Encounter (Signed)
Copied from CRM [id number]. Topic: Referral - Question >> Nov 17, 2023  9:19 AM Fuller Mandril wrote: Reason for CRM: Tresa Endo from [facility] Called states she requested confirmation and instructions for Plavix due to patient having upcoming hip surgery. Also sent urgent fax and has not received a response. Call back number: [phone]. Thank You  Please advise hold time if any for pt upcoming  surgery. New form was requested. KH

## 2023-11-19 ENCOUNTER — Other Ambulatory Visit: Payer: Self-pay

## 2023-11-19 ENCOUNTER — Encounter (HOSPITAL_COMMUNITY): Payer: Self-pay

## 2023-11-19 ENCOUNTER — Encounter (HOSPITAL_COMMUNITY)
Admission: RE | Admit: 2023-11-19 | Discharge: 2023-11-19 | Disposition: A | Discharge status: Home / Self Care | Payer: 59 | Source: Ambulatory Visit | Attending: Orthopedic Surgery | Admitting: Orthopedic Surgery

## 2023-11-19 VITALS — BP 151/77 | HR 66 | Temp 98.2°F | Resp 16 | Ht 67.0 in | Wt 156.0 lb

## 2023-11-19 DIAGNOSIS — Z01810 Encounter for preprocedural cardiovascular examination: Secondary | ICD-10-CM | POA: Diagnosis present

## 2023-11-19 DIAGNOSIS — R9431 Abnormal electrocardiogram [ECG] [EKG]: Secondary | ICD-10-CM | POA: Diagnosis not present

## 2023-11-19 DIAGNOSIS — Z01818 Encounter for other preprocedural examination: Secondary | ICD-10-CM | POA: Diagnosis not present

## 2023-11-19 DIAGNOSIS — Z01812 Encounter for preprocedural laboratory examination: Secondary | ICD-10-CM | POA: Diagnosis present

## 2023-11-19 HISTORY — DX: Heart failure, unspecified: I50.9

## 2023-11-19 HISTORY — DX: Cerebral infarction, unspecified: I63.9

## 2023-11-19 LAB — CBC
HCT: 41.6 % (ref 39.0–52.0)
Hemoglobin: 14.2 g/dL (ref 13.0–17.0)
MCH: 33 pg (ref 26.0–34.0)
MCHC: 34.1 g/dL (ref 30.0–36.0)
MCV: 96.7 fL (ref 80.0–100.0)
Platelets: 189 10*3/uL (ref 150–400)
RBC: 4.3 MIL/uL (ref 4.22–5.81)
RDW: 12.1 % (ref 11.5–15.5)
WBC: 10.1 10*3/uL (ref 4.0–10.5)
nRBC: 0 % (ref 0.0–0.2)

## 2023-11-19 LAB — COMPREHENSIVE METABOLIC PANEL
ALT: 29 U/L (ref 0–44)
AST: 33 U/L (ref 15–41)
Albumin: 3.8 g/dL (ref 3.5–5.0)
Alkaline Phosphatase: 77 U/L (ref 38–126)
Anion gap: 8 (ref 5–15)
BUN: 9 mg/dL (ref 8–23)
CO2: 22 mmol/L (ref 22–32)
Calcium: 9.1 mg/dL (ref 8.9–10.3)
Chloride: 103 mmol/L (ref 98–111)
Creatinine, Ser: 0.67 mg/dL (ref 0.61–1.24)
GFR, Estimated: >60 mL/min (ref >60)
Glucose, Bld: 126 mg/dL — ABNORMAL HIGH (ref 70–99)
Potassium: 3.6 mmol/L (ref 3.5–5.1)
Sodium: 133 mmol/L — ABNORMAL LOW (ref 135–145)
Total Bilirubin: 0.8 mg/dL (ref 0.0–1.2)
Total Protein: 7.6 g/dL (ref 6.5–8.1)

## 2023-11-19 LAB — SURGICAL PCR SCREEN
MRSA, PCR: NEGATIVE
Staphylococcus aureus: NEGATIVE

## 2023-11-19 LAB — TYPE AND SCREEN
ABO/RH(D): B POS
Antibody Screen: NEGATIVE

## 2023-11-19 LAB — GLUCOSE, CAPILLARY: Glucose-Capillary: 139 mg/dL — ABNORMAL HIGH (ref 70–99)

## 2023-11-20 ENCOUNTER — Encounter (HOSPITAL_COMMUNITY): Payer: Self-pay

## 2023-11-20 NOTE — Progress Notes (Signed)
Case: [id number] Date/Time: 12/02/23 1100   Procedure: TOTAL HIP ARTHROPLASTY ANTERIOR APPROACH (Left: Hip)   Anesthesia type: Spinal   Pre-op diagnosis: OA LEFT HIP   Location: [facility] ROOM 08 / [facility]   Surgeons: Sheral Apley, MD       DISCUSSION:[name] is a 68 yo male who presents to PAT prior to surgery above. PMH of smoking, HTN, HLD, hx of CVA (in 2022), hx of hep C s/p tx at the [place], GERD, DM, hx of ETOH and cocaine abuse, RA and OA  Patient follows with PCP. Last seen on 10/28/23 for pre op clearance. No acute issues noted. BP mildly elevated. He has hx of cerebellar CVA in 2022 and is on Plavix. DM is controlled, last A1c was 5.7 on 10/28/23. Cleared for surgery:  "Preop examination - Primary   Going for left hip replacement surgery EKG obtained shows NSR with sinus arrhythymia rate of 67bpm . Antereseptal infarct , age undetermined, I have no concern for acute ischemia.  Most recent  EF was 60 to 65% in 2022  Patient denies any previous diagnosis of heart disease, this EKG result was compared to previous EKGs noted in the chart on epic on Epic Blood pressure and diabetes is well-controlled"  Plavix-  last dose on 11/26/23 per pt   VS: BP (!) 151/77   Pulse 66   Temp 36.8 C (Oral)   Resp 16   Ht 5\' 7"  (1.702 m)   Wt 70.8 kg   SpO2 100%   BMI 24.43 kg/m   PROVIDERS: Ivonne Andrew, NP   LABS: Labs reviewed: Acceptable for surgery. (all labs ordered are listed, but only abnormal results are displayed)  Labs Reviewed  GLUCOSE, CAPILLARY - Abnormal; Notable for the following components:      Result Value   Glucose-Capillary 139 (*)    All other components within normal limits  COMPREHENSIVE METABOLIC PANEL - Abnormal; Notable for the following components:   Sodium 133 (*)    Glucose, Bld 126 (*)    All other components within normal limits  SURGICAL PCR SCREEN  CBC  TYPE AND SCREEN     IMAGES:   EKG 11/20/23 Normal sinus rhythm with sinus  arrhythmia, rate 67 Nonspecific T wave abnormality  CV:  Echo 05/15/2021:  IMPRESSIONS    1. Left ventricular ejection fraction, by estimation, is 60 to 65%. The left ventricle has normal function. The left ventricle has no regional wall motion abnormalities. There is mild left ventricular hypertrophy of the basal-septal segment. Left ventricular diastolic parameters are consistent with Grade I diastolic dysfunction (impaired relaxation).  2. Right ventricular systolic function is normal. The right ventricular size is normal.  3. Unusual echogenicity in the left atrium appears fixed and is probably a prominent "coumadin ridge", rather than a thrombus.  4. The mitral valve is normal in structure. No evidence of mitral valve regurgitation. No evidence of mitral stenosis.  5. The aortic valve is normal in structure. Aortic valve regurgitation is not visualized. No aortic stenosis is present.  6. The inferior vena cava is normal in size with greater than 50% respiratory variability, suggesting right atrial pressure of 3 mmHg.  7. Agitated saline contrast bubble study was negative, with no evidence of any interatrial shunt.  Conclusion(s)/Recommendation(s): No intracardiac source of embolism detected on this transthoracic study. A transesophageal echocardiogram is recommended to exclude cardiac source of embolism if clinically indicated.  Cardiac cath 08/18/12 (care everywhere): - Left main coronary artery:  Normal. - Left circumflex: Small OM1, tiny OM2. Tiny LPL1, LPL2, and LPL3. - Left anterior descending: Normal D1, normal D2, normal D3. The three diagonal branches originate close to each other. - Right coronary artery: Small codominant vessel, supplies a small PDA and a small acute marginal branch. - Left ventricular pressures: LVEDP ~ 10 mmHg.  - Impressions: Normal coronaries. No apparent complications.  Past Medical History:  Diagnosis Date   Alcohol abuse    Allergic  rhinitis    CHF (congestive heart failure) (HCC)    Diabetes mellitus without complication (HCC)    'sometime last yr'--type 2   GERD (gastroesophageal reflux disease)    Hepatitis    he thinks its hep b or c   Hyperlipidemia    Hypertension    Osteoarthritis    Pancreatitis    Rheumatoid arthritis(714.0)    Stroke (HCC)    Tobacco user     Past Surgical History:  Procedure Laterality Date   COLONOSCOPY     FRACTURE SURGERY     cheek bone fracture    [facility]     right shoulder  -- 2010   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   Procedure: TOTAL HIP ARTHROPLASTY ANTERIOR APPROACH;  Surgeon: Sheral Apley, MD;  Location: [facility] OR;  Service: Orthopedics;  Laterality: Right;   WRIST SURGERY     fusion with pins  2007    MEDICATIONS:  atorvastatin (LIPITOR) 40 MG tablet   blood glucose meter kit and supplies KIT   clopidogrel (PLAVIX) 75 MG tablet   metFORMIN (GLUCOPHAGE) 500 MG tablet   metoprolol tartrate (LOPRESSOR) 50 MG tablet   tamsulosin (FLOMAX) 0.4 MG CAPS capsule   traZODone (DESYREL) 100 MG tablet   No current facility-administered medications for this encounter.   [name] [facility] Surgical Short Stay/Anesthesiology [facility] Phone [phone] 11/20/2023 1:30 PM

## 2023-11-20 NOTE — Anesthesia Preprocedure Evaluation (Addendum)
Anesthesia Evaluation  Patient identified by MRN, date of birth, ID band Patient awake    Reviewed: Allergy & Precautions, NPO status , Patient's Chart, lab work & pertinent test results, reviewed documented beta blocker date and time   Airway Mallampati: II  TM Distance: >3 FB Neck ROM: Full    Dental  (+) Poor Dentition, Chipped, Missing, Dental Advisory Given   Pulmonary Current Smoker   Pulmonary exam normal breath sounds clear to auscultation       Cardiovascular hypertension, Pt. on home beta blockers and Pt. on medications +CHF   Rhythm:Regular Rate:Normal  Echo 04/2021  1. Left ventricular ejection fraction, by estimation, is 60 to 65%. The left ventricle has normal function. The left ventricle has no regional wall motion abnormalities. There is mild left ventricular hypertrophy of the basal-septal segment. Left ventricular diastolic parameters are consistent with Grade I diastolic dysfunction (impaired relaxation).   2. Right ventricular systolic function is normal. The right ventricular size is normal.   3. Unusual echogenicity in the left atrium appears fixed and is probably a prominent "coumadin ridge", rather than a thrombus.   4. The mitral valve is normal in structure. No evidence of mitral valve regurgitation. No evidence of mitral stenosis.   5. The aortic valve is normal in structure. Aortic valve regurgitation is not visualized. No aortic stenosis is present.   6. The inferior vena cava is normal in size with greater than 50% respiratory variability, suggesting right atrial pressure of 3 mmHg.   7. Agitated saline contrast bubble study was negative, with no evidence of any interatrial shunt.   Conclusion(s)/Recommendation(s): No intracardiac source of embolism detected on this transthoracic study. A transesophageal echocardiogram is recommended to exclude cardiac source of embolism if clinically indicated.      Neuro/Psych   Anxiety     CVA, Residual Symptoms    GI/Hepatic ,GERD  Controlled,,(+) Hepatitis -  Endo/Other  diabetes    Renal/GU negative Renal ROS     Musculoskeletal  (+) Arthritis ,    Abdominal   Peds  Hematology negative hematology ROS (+)   Anesthesia Other Findings   Reproductive/Obstetrics                             Anesthesia Physical Anesthesia Plan  ASA: 3  Anesthesia Plan: General   Post-op Pain Management: Tylenol PO (pre-op)*   Induction: Intravenous  PONV Risk Score and Plan: 2 and Ondansetron, Dexamethasone and Treatment may vary due to age or medical condition  Airway Management Planned: Oral ETT  Additional Equipment: None  Intra-op Plan:   Post-operative Plan: Extubation in OR  Informed Consent: I have reviewed the patients History and Physical, chart, labs and discussed the procedure including the risks, benefits and alternatives for the proposed anesthesia with the patient or authorized representative who has indicated his/her understanding and acceptance.     Dental advisory given  Plan Discussed with: CRNA  Anesthesia Plan Comments: (See PAT note from 1/22 by K Gekas PA-C  Pt unsure last dose of plavix. Will proceed with GETA   Risks of anesthesia explained at length. This includes, but is not limited to, sore throat, damage to teeth, lips gums, tongue and vocal cords, nausea and vomiting, reactions to medications, stroke, heart attack, and death. All patient questions were answered and the patient wishes to proceed. )        Anesthesia Quick Evaluation

## 2023-11-25 NOTE — H&P (Signed)
HIP ARTHROPLASTY ADMISSION H&P  Patient ID: BRADSHAW MINIHAN MRN: [id number] DOB/AGE: 04/01/1956 68 y.o.  Chief Complaint: left hip pain.  Planned Procedure Date: 12/02/23 Medical and Cardiac Clearance by Irene Shipper NP   Pain management Clearance by Stefani Dama PA-C   HPI: [name] is a 68 y.o. male who presents for evaluation of OA LEFT HIP. The patient has a history of pain and functional disability in the left hip due to arthritis and has failed non-surgical conservative treatments for greater than 12 weeks to include NSAID's and/or analgesics, use of assistive devices, and activity modification.  Onset of symptoms was gradual, starting 5 years ago with gradually worsening course since that time. The patient noted no past surgery on the left hip.  Patient currently rates pain at 10 out of 10 with activity. Patient has night pain, worsening of pain with activity and weight bearing, and pain that interferes with activities of daily living.  Patient has evidence of subchondral cysts, subchondral sclerosis, periarticular osteophytes, and joint space narrowing by imaging studies.  There is no active infection.  Past Medical History:  Diagnosis Date   Alcohol abuse    Allergic rhinitis    CHF (congestive heart failure) (HCC)    Diabetes mellitus without complication (HCC)    'sometime last yr'--type 2   GERD (gastroesophageal reflux disease)    Hepatitis    he thinks its hep b or c   Hyperlipidemia    Hypertension    Osteoarthritis    Pancreatitis    Rheumatoid arthritis(714.0)    Stroke (HCC)    Tobacco user    Past Surgical History:  Procedure Laterality Date   COLONOSCOPY     FRACTURE SURGERY     cheek bone fracture    [facility]     right shoulder  -- 2010   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   Procedure: TOTAL HIP ARTHROPLASTY ANTERIOR APPROACH;  Surgeon: Sheral Apley, MD;  Location: [facility] OR;  Service: Orthopedics;  Laterality: Right;   WRIST SURGERY     fusion  with pins  2007   Allergies  Allergen Reactions   Lisinopril Other (See Comments)    angioedema   Citalopram Nausea Only   Morphine Nausea And Vomiting   Nsaids Other (See Comments)    Stomach Irritability    Paroxetine Nausea And Vomiting   Prior to Admission medications   Medication Sig Start Date End Date Taking? Authorizing Provider  atorvastatin (LIPITOR) 40 MG tablet Take 1 tablet (40 mg total) by mouth daily. Patient taking differently: Take 20 mg by mouth at bedtime. 06/20/22 11/19/23 Yes Ivonne Andrew, NP  clopidogrel (PLAVIX) 75 MG tablet Take 75 mg by mouth daily. 07/23/23  Yes [provider]  metFORMIN (GLUCOPHAGE) 500 MG tablet Take 1 tablet (500 mg total) by mouth daily with breakfast. 09/25/22 11/19/23 Yes Ivonne Andrew, NP  metoprolol tartrate (LOPRESSOR) 50 MG tablet Take 1 tablet (50 mg total) by mouth 2 (two) times daily. Take one two times a day for blood pressure. Patient taking differently: Take 25 mg by mouth 2 (two) times daily. Take one two times a day for blood pressure. 06/20/22 11/19/23 Yes Ivonne Andrew, NP  tamsulosin (FLOMAX) 0.4 MG CAPS capsule Take 2 capsules (0.8 mg total) by mouth daily. Patient taking differently: Take 0.4 mg by mouth daily. 06/20/22  Yes Ivonne Andrew, NP  traZODone (DESYREL) 100 MG tablet Take 1 tablet (100 mg total) by mouth at bedtime.  Take one table by mouth at bedtime as needed for sleep. 10/28/23 10/27/24 Yes Paseda, Baird Kay, FNP  blood glucose meter kit and supplies KIT Dispense based on patient and insurance preference. Use up to four times daily as directed. Patient not taking: Reported on 10/28/2023 07/05/21   Barbette Merino, NP   Social History   Socioeconomic History   Marital status: Single    Spouse name: Not on file   Number of children: Not on file   Years of education: Not on file   Highest education level: Not on file  Occupational History   Not on file  Tobacco Use   Smoking status: Every  Day    Current packs/day: 0.40    Average packs/day: 0.4 packs/day for 25.0 years (10.0 ttl pk-yrs)    Types: Cigarettes    Passive exposure: Current   Smokeless tobacco: Never   Tobacco comments:    Trying to quit smoking.  Currently smoking 1 -2 cigarette per day and is doing "OK" with decrease.  Vaping Use   Vaping status: Never Used  Substance and Sexual Activity   Alcohol use: Yes    Alcohol/week: 7.0 - 14.0 standard drinks of alcohol    Types: 7 - 14 Cans of beer per week    Comment: 1-2 beers occasionally   Drug use: Not Currently    Comment: hx of 20 years ago -cocaine  and marijuanan   Sexual activity: Yes  Other Topics Concern   Not on file  Social History Narrative   Georganna Skeans. Married- 1 male sexual partner.   Denies drug use but has hx of crack cocaine, alcohol and tobacco use.   Social Drivers of Corporate investment banker Strain: Low Risk  (07/10/2023)   Overall Financial Resource Strain (CARDIA)    Difficulty of Paying Living Expenses: Not very hard  Food Insecurity: Food Insecurity Present (07/10/2023)   Hunger Vital Sign    Worried About Running Out of Food in the Last Year: Often true    Ran Out of Food in the Last Year: Often true  Transportation Needs: No Transportation Needs (07/10/2023)   PRAPARE - Administrator, Civil Service (Medical): No    Lack of Transportation (Non-Medical): No  Physical Activity: Sufficiently Active (07/10/2023)   Exercise Vital Sign    Days of Exercise per Week: 7 days    Minutes of Exercise per Session: 30 min  Stress: No Stress Concern Present (07/10/2023)   [employer] of Occupational Health - Occupational Stress Questionnaire    Feeling of Stress : Only a little  Social Connections: Moderately Isolated (07/10/2023)   Social Connection and Isolation Panel [NHANES]    Frequency of Communication with Friends and Family: More than three times a week    Frequency of Social Gatherings with Friends and Family:  More than three times a week    Attends Religious Services: More than 4 times per year    Active Member of [employer] or Organizations: No    Attends Engineer, structural: Never    Marital Status: Never married   Family History  Problem Relation Age of Onset   Kidney disease Brother    Angioedema Maternal Aunt        Tongue swelling   Heart attack Mother    Heart attack Father     ROS: Currently denies lightheadedness, dizziness, Fever, chills, CP, SOB.   No personal history of DVT, PE, or MI + h/o CVA July  2022 No loose teeth Partial dentures are present All other systems have been reviewed and were otherwise currently negative with the exception of those mentioned in the HPI and as above.  Objective: Vitals: Ht: 5'7" Wt: 150.6 lbs Temp: 98.6 BP: 147/79 Pulse: 69 O2 96% on room air.   Physical Exam: General: Alert, NAD. Trendelenberg Gait  HEENT: EOMI, Good Neck Extension  Pulm: No increased work of breathing.  Clear B/L A/P w/o crackle or wheeze.  CV: RRR, No m/g/r appreciated  GI: soft, NT, ND. BS x 4 quadrants Neuro: CN II-XII grossly intact without focal deficit.  Sensation intact distally Skin: No lesions in the area of chief complaint MSK/Surgical Site:  + TTP. Hip ROM decreased d/t pain. + Stinchfield. - SLR. + FABER/FADIR.  Decreased strength.  NVI.    Imaging Review Plain radiographs demonstrate moderate degenerative joint disease of the left hip.   The bone quality appears to be poor for age and reported activity level.  Preoperative templating of the joint replacement has been completed, documented, and submitted to the Operating Room personnel in order to optimize intra-operative equipment management.  Assessment: OA LEFT HIP Active Problems:   * No active hospital problems. *   Plan: Plan for Procedure(s): TOTAL HIP ARTHROPLASTY ANTERIOR APPROACH  The patient history, physical exam, clinical judgement of the provider and imaging are consistent with  end stage degenerative joint disease and total joint arthroplasty is deemed medically necessary. The treatment options including medical management, injection therapy, and arthroplasty were discussed at length. The risks and benefits of Procedure(s): TOTAL HIP ARTHROPLASTY ANTERIOR APPROACH were presented and reviewed.  The risks of nonoperative treatment, versus surgical intervention including but not limited to continued pain, aseptic loosening, stiffness, dislocation/subluxation, infection, bleeding, nerve injury, blood clots, cardiopulmonary complications, morbidity, mortality, among others were discussed. The patient verbalizes understanding and wishes to proceed with the plan.  Patient is being admitted for surgery, pain control, PT, prophylactic antibiotics, VTE prophylaxis, progressive ambulation, ADL's and discharge planning. He will spend the night in observation.  Dental prophylaxis discussed and recommended for 2 years postoperatively.  The patient does meet the criteria for TXA which will be used perioperatively.   Xarelto 10mg  will be used postoperatively for DVT prophylaxis in addition to SCDs, and early ambulation. Plan for him to take his normal daily dose of Oxycodone for chronic pain and we will provide additional Oxy on top of that as well as Tylenol for pain.   Robaxin for muscle spasm.  Zofran for nausea and vomiting. Senokot is for constipation prevention. Pharmacy- [facility] [address] The patient is planning to be discharged home with OPPT and into the care of his brother Briston Lax who can be reached at [phone] or his daughter Morene Rankins who can be reached at [phone] Follow up appt 12/17/23 at [facility] [place] Office [phone] 11/25/2023 10:15 AM

## 2023-12-02 ENCOUNTER — Other Ambulatory Visit: Payer: Self-pay

## 2023-12-02 ENCOUNTER — Observation Stay (HOSPITAL_COMMUNITY)
Admission: RE | Admit: 2023-12-02 | Discharge: 2023-12-04 | Disposition: A | Discharge status: Home / Self Care | Payer: 59 | Source: Ambulatory Visit | Attending: Orthopedic Surgery | Admitting: Orthopedic Surgery

## 2023-12-02 ENCOUNTER — Ambulatory Visit (HOSPITAL_COMMUNITY): Payer: 59

## 2023-12-02 ENCOUNTER — Encounter (HOSPITAL_COMMUNITY): Payer: Self-pay | Admitting: Orthopedic Surgery

## 2023-12-02 ENCOUNTER — Encounter (HOSPITAL_COMMUNITY)
Admission: RE | Disposition: A | Discharge status: Home / Self Care | Payer: Self-pay | Source: Ambulatory Visit | Attending: Orthopedic Surgery

## 2023-12-02 ENCOUNTER — Ambulatory Visit (HOSPITAL_COMMUNITY): Payer: 59 | Admitting: Medical

## 2023-12-02 ENCOUNTER — Ambulatory Visit (HOSPITAL_BASED_OUTPATIENT_CLINIC_OR_DEPARTMENT_OTHER): Payer: 59 | Admitting: Certified Registered"

## 2023-12-02 DIAGNOSIS — I110 Hypertensive heart disease with heart failure: Secondary | ICD-10-CM | POA: Diagnosis not present

## 2023-12-02 DIAGNOSIS — Z7902 Long term (current) use of antithrombotics/antiplatelets: Secondary | ICD-10-CM | POA: Diagnosis not present

## 2023-12-02 DIAGNOSIS — F17210 Nicotine dependence, cigarettes, uncomplicated: Secondary | ICD-10-CM | POA: Diagnosis not present

## 2023-12-02 DIAGNOSIS — Z8673 Personal history of transient ischemic attack (TIA), and cerebral infarction without residual deficits: Secondary | ICD-10-CM | POA: Insufficient documentation

## 2023-12-02 DIAGNOSIS — M1612 Unilateral primary osteoarthritis, left hip: Principal | ICD-10-CM | POA: Insufficient documentation

## 2023-12-02 DIAGNOSIS — Z96642 Presence of left artificial hip joint: Principal | ICD-10-CM | POA: Insufficient documentation

## 2023-12-02 DIAGNOSIS — I509 Heart failure, unspecified: Secondary | ICD-10-CM

## 2023-12-02 DIAGNOSIS — Z96641 Presence of right artificial hip joint: Secondary | ICD-10-CM | POA: Diagnosis not present

## 2023-12-02 DIAGNOSIS — I639 Cerebral infarction, unspecified: Secondary | ICD-10-CM | POA: Diagnosis not present

## 2023-12-02 DIAGNOSIS — Z7984 Long term (current) use of oral hypoglycemic drugs: Secondary | ICD-10-CM | POA: Diagnosis not present

## 2023-12-02 DIAGNOSIS — Z01818 Encounter for other preprocedural examination: Secondary | ICD-10-CM

## 2023-12-02 DIAGNOSIS — Z79899 Other long term (current) drug therapy: Secondary | ICD-10-CM | POA: Insufficient documentation

## 2023-12-02 DIAGNOSIS — E119 Type 2 diabetes mellitus without complications: Secondary | ICD-10-CM | POA: Diagnosis not present

## 2023-12-02 HISTORY — PX: TOTAL HIP ARTHROPLASTY: SHX124

## 2023-12-02 LAB — GLUCOSE, CAPILLARY
Glucose-Capillary: 147 mg/dL — ABNORMAL HIGH (ref 70–99)
Glucose-Capillary: 140 mg/dL — ABNORMAL HIGH (ref 70–99)
Glucose-Capillary: 154 mg/dL — ABNORMAL HIGH (ref 70–99)

## 2023-12-02 SURGERY — ARTHROPLASTY, HIP, TOTAL, ANTERIOR APPROACH
Anesthesia: General | Site: Hip | Laterality: Left

## 2023-12-02 MED ORDER — ACETAMINOPHEN 500 MG PO TABS
1000.0000 mg | ORAL_TABLET | Freq: Four times a day (QID) | ORAL | Status: AC
  Administered 2023-12-02 – 2023-12-03 (×4): 1000 mg via ORAL
  Filled 2023-12-02 (×4): qty 2

## 2023-12-02 MED ORDER — HYDROMORPHONE HCL 1 MG/ML IJ SOLN
0.5000 mg | INTRAMUSCULAR | Status: DC | PRN
  Administered 2023-12-02 (×2): 0.5 mg via INTRAVENOUS

## 2023-12-02 MED ORDER — TRANEXAMIC ACID-NACL 1000-0.7 MG/100ML-% IV SOLN
1000.0000 mg | Freq: Once | INTRAVENOUS | Status: AC
  Administered 2023-12-02: 1000 mg via INTRAVENOUS
  Filled 2023-12-02: qty 100

## 2023-12-02 MED ORDER — CEFAZOLIN SODIUM-DEXTROSE 2-4 GM/100ML-% IV SOLN
2.0000 g | Freq: Four times a day (QID) | INTRAVENOUS | Status: AC
  Administered 2023-12-02 (×2): 2 g via INTRAVENOUS
  Filled 2023-12-02 (×2): qty 100

## 2023-12-02 MED ORDER — ACETAMINOPHEN 500 MG PO TABS
1000.0000 mg | ORAL_TABLET | Freq: Once | ORAL | Status: AC
  Administered 2023-12-02: 1000 mg via ORAL
  Filled 2023-12-02: qty 2

## 2023-12-02 MED ORDER — BISACODYL 10 MG RE SUPP: 10.0000 mg | Freq: Every day | RECTAL | Status: DC | PRN

## 2023-12-02 MED ORDER — MENTHOL 3 MG MT LOZG: 1.0000 | LOZENGE | OROMUCOSAL | Status: DC | PRN

## 2023-12-02 MED ORDER — TRAZODONE HCL 100 MG PO TABS
100.0000 mg | ORAL_TABLET | Freq: Every day | ORAL | Status: DC
  Administered 2023-12-02 – 2023-12-03 (×2): 100 mg via ORAL
  Filled 2023-12-02 (×2): qty 1

## 2023-12-02 MED ORDER — POVIDONE-IODINE 10 % EX SWAB
2.0000 | Freq: Once | CUTANEOUS | Status: AC
  Administered 2023-12-02: 2 via TOPICAL

## 2023-12-02 MED ORDER — METHOCARBAMOL 1000 MG/10ML IJ SOLN: 500.0000 mg | Freq: Four times a day (QID) | INTRAMUSCULAR | Status: DC | PRN

## 2023-12-02 MED ORDER — LIDOCAINE HCL (CARDIAC) PF 100 MG/5ML IV SOSY
PREFILLED_SYRINGE | INTRAVENOUS | Status: DC | PRN
  Administered 2023-12-02: 60 mg via INTRAVENOUS

## 2023-12-02 MED ORDER — INSULIN ASPART 100 UNIT/ML IJ SOLN
0.0000 [IU] | INTRAMUSCULAR | Status: DC | PRN
  Administered 2023-12-02: 2 [IU] via SUBCUTANEOUS

## 2023-12-02 MED ORDER — DEXAMETHASONE SODIUM PHOSPHATE 10 MG/ML IJ SOLN
8.0000 mg | Freq: Once | INTRAMUSCULAR | Status: AC
Start: 2023-12-02 — End: 2023-12-02
  Administered 2023-12-02: 8 mg via INTRAVENOUS

## 2023-12-02 MED ORDER — ONDANSETRON HCL 4 MG/2ML IJ SOLN: 4.0000 mg | Freq: Four times a day (QID) | INTRAMUSCULAR | Status: DC | PRN

## 2023-12-02 MED ORDER — ATORVASTATIN CALCIUM 20 MG PO TABS
20.0000 mg | ORAL_TABLET | Freq: Every day | ORAL | Status: DC
  Administered 2023-12-02 – 2023-12-03 (×2): 20 mg via ORAL
  Filled 2023-12-02 (×2): qty 1

## 2023-12-02 MED ORDER — PHENOL 1.4 % MT LIQD: 1.0000 | OROMUCOSAL | Status: DC | PRN

## 2023-12-02 MED ORDER — PROPOFOL 1000 MG/100ML IV EMUL
INTRAVENOUS | Status: AC
  Filled 2023-12-02: qty 100

## 2023-12-02 MED ORDER — TRANEXAMIC ACID-NACL 1000-0.7 MG/100ML-% IV SOLN
1000.0000 mg | INTRAVENOUS | Status: AC
  Administered 2023-12-02: 1000 mg via INTRAVENOUS
  Filled 2023-12-02: qty 100

## 2023-12-02 MED ORDER — BUPIVACAINE LIPOSOME 1.3 % IJ SUSP
INTRAMUSCULAR | Status: AC
  Filled 2023-12-02: qty 10

## 2023-12-02 MED ORDER — OXYCODONE HCL 5 MG PO TABS
5.0000 mg | ORAL_TABLET | Freq: Once | ORAL | Status: AC | PRN
  Administered 2023-12-02: 5 mg via ORAL

## 2023-12-02 MED ORDER — PANTOPRAZOLE SODIUM 40 MG PO TBEC
40.0000 mg | DELAYED_RELEASE_TABLET | Freq: Every day | ORAL | Status: DC
  Administered 2023-12-02 – 2023-12-04 (×3): 40 mg via ORAL
  Filled 2023-12-02 (×3): qty 1

## 2023-12-02 MED ORDER — AMLODIPINE BESYLATE 10 MG PO TABS
10.0000 mg | ORAL_TABLET | Freq: Every day | ORAL | Status: DC
  Administered 2023-12-03 – 2023-12-04 (×2): 10 mg via ORAL
  Filled 2023-12-02 (×2): qty 1

## 2023-12-02 MED ORDER — MIDAZOLAM HCL 2 MG/2ML IJ SOLN
INTRAMUSCULAR | Status: AC
Start: 2023-12-02 — End: ?
  Filled 2023-12-02: qty 2

## 2023-12-02 MED ORDER — SODIUM CHLORIDE (PF) 0.9 % IJ SOLN
INTRAMUSCULAR | Status: AC
  Filled 2023-12-02: qty 10

## 2023-12-02 MED ORDER — DEXAMETHASONE SODIUM PHOSPHATE 10 MG/ML IJ SOLN
INTRAMUSCULAR | Status: AC
  Filled 2023-12-02: qty 1

## 2023-12-02 MED ORDER — HYDROMORPHONE HCL 1 MG/ML IJ SOLN
0.5000 mg | INTRAMUSCULAR | Status: DC | PRN
  Administered 2023-12-02 – 2023-12-04 (×7): 1 mg via INTRAVENOUS
  Filled 2023-12-02 (×7): qty 1

## 2023-12-02 MED ORDER — BUPIVACAINE LIPOSOME 1.3 % IJ SUSP
INTRAMUSCULAR | Status: DC | PRN
  Administered 2023-12-02: 10 mL

## 2023-12-02 MED ORDER — HYDROMORPHONE HCL 1 MG/ML IJ SOLN
INTRAMUSCULAR | Status: AC
  Filled 2023-12-02: qty 1

## 2023-12-02 MED ORDER — ONDANSETRON HCL 4 MG/2ML IJ SOLN
INTRAMUSCULAR | Status: AC
  Filled 2023-12-02: qty 2

## 2023-12-02 MED ORDER — MAGNESIUM CITRATE PO SOLN: 1.0000 | Freq: Once | ORAL | Status: DC | PRN

## 2023-12-02 MED ORDER — METOPROLOL TARTRATE 25 MG PO TABS
25.0000 mg | ORAL_TABLET | Freq: Two times a day (BID) | ORAL | Status: DC
Start: 2023-12-02 — End: 2023-12-04
  Administered 2023-12-02 – 2023-12-04 (×4): 25 mg via ORAL
  Filled 2023-12-02 (×4): qty 1

## 2023-12-02 MED ORDER — DROPERIDOL 2.5 MG/ML IJ SOLN: 0.6250 mg | Freq: Once | INTRAMUSCULAR | Status: DC | PRN

## 2023-12-02 MED ORDER — DIPHENHYDRAMINE HCL 12.5 MG/5ML PO ELIX
12.5000 mg | ORAL_SOLUTION | ORAL | Status: DC | PRN
Start: 2023-12-02 — End: 2023-12-04

## 2023-12-02 MED ORDER — FENTANYL CITRATE (PF) 100 MCG/2ML IJ SOLN
INTRAMUSCULAR | Status: AC
  Filled 2023-12-02: qty 2

## 2023-12-02 MED ORDER — POLYETHYLENE GLYCOL 3350 17 G PO PACK: 17.0000 g | PACK | Freq: Every day | ORAL | Status: DC | PRN

## 2023-12-02 MED ORDER — METOCLOPRAMIDE HCL 5 MG/ML IJ SOLN: 5.0000 mg | Freq: Three times a day (TID) | INTRAMUSCULAR | Status: DC | PRN

## 2023-12-02 MED ORDER — FENTANYL CITRATE (PF) 100 MCG/2ML IJ SOLN
INTRAMUSCULAR | Status: DC | PRN
  Administered 2023-12-02 (×3): 50 ug via INTRAVENOUS
  Administered 2023-12-02: 100 ug via INTRAVENOUS

## 2023-12-02 MED ORDER — ONDANSETRON HCL 4 MG/2ML IJ SOLN
INTRAMUSCULAR | Status: DC | PRN
  Administered 2023-12-02: 4 mg via INTRAVENOUS

## 2023-12-02 MED ORDER — MIDAZOLAM HCL 5 MG/5ML IJ SOLN
INTRAMUSCULAR | Status: DC | PRN
  Administered 2023-12-02: 2 mg via INTRAVENOUS

## 2023-12-02 MED ORDER — METHOCARBAMOL 1000 MG/10ML IJ SOLN
500.0000 mg | Freq: Once | INTRAMUSCULAR | Status: AC
  Administered 2023-12-02: 500 mg via INTRAVENOUS

## 2023-12-02 MED ORDER — SODIUM CHLORIDE FLUSH 0.9 % IV SOLN
INTRAVENOUS | Status: DC | PRN
  Administered 2023-12-02: 20 mL

## 2023-12-02 MED ORDER — SUGAMMADEX SODIUM 200 MG/2ML IV SOLN
INTRAVENOUS | Status: DC | PRN
  Administered 2023-12-02: 200 mg via INTRAVENOUS

## 2023-12-02 MED ORDER — ROCURONIUM BROMIDE 100 MG/10ML IV SOLN
INTRAVENOUS | Status: DC | PRN
  Administered 2023-12-02: 50 mg via INTRAVENOUS

## 2023-12-02 MED ORDER — HYDROMORPHONE HCL 1 MG/ML IJ SOLN
INTRAMUSCULAR | Status: AC
  Filled 2023-12-02: qty 2

## 2023-12-02 MED ORDER — 0.9 % SODIUM CHLORIDE (POUR BTL) OPTIME
TOPICAL | Status: DC | PRN
  Administered 2023-12-02: 1000 mL

## 2023-12-02 MED ORDER — TAMSULOSIN HCL 0.4 MG PO CAPS
0.4000 mg | ORAL_CAPSULE | Freq: Every day | ORAL | Status: DC
Start: 2023-12-03 — End: 2023-12-04
  Administered 2023-12-03 – 2023-12-04 (×2): 0.4 mg via ORAL
  Filled 2023-12-02 (×2): qty 1

## 2023-12-02 MED ORDER — OXYCODONE HCL 5 MG PO TABS: 5.0000 mg | ORAL_TABLET | ORAL | Status: DC | PRN

## 2023-12-02 MED ORDER — PROPOFOL 10 MG/ML IV BOLUS
INTRAVENOUS | Status: DC | PRN
  Administered 2023-12-02: 140 mg via INTRAVENOUS

## 2023-12-02 MED ORDER — ORAL CARE MOUTH RINSE: 15.0000 mL | Freq: Once | OROMUCOSAL | Status: AC

## 2023-12-02 MED ORDER — ALUM & MAG HYDROXIDE-SIMETH 200-200-20 MG/5ML PO SUSP: 30.0000 mL | ORAL | Status: DC | PRN

## 2023-12-02 MED ORDER — CHLORHEXIDINE GLUCONATE 0.12 % MT SOLN
15.0000 mL | Freq: Once | OROMUCOSAL | Status: AC
  Administered 2023-12-02: 15 mL via OROMUCOSAL

## 2023-12-02 MED ORDER — METFORMIN HCL 500 MG PO TABS
500.0000 mg | ORAL_TABLET | Freq: Every day | ORAL | Status: DC
  Administered 2023-12-03 – 2023-12-04 (×2): 500 mg via ORAL
  Filled 2023-12-02 (×2): qty 1

## 2023-12-02 MED ORDER — DEXAMETHASONE SODIUM PHOSPHATE 10 MG/ML IJ SOLN
10.0000 mg | Freq: Once | INTRAMUSCULAR | Status: AC
  Administered 2023-12-03: 10 mg via INTRAVENOUS
  Filled 2023-12-02: qty 1

## 2023-12-02 MED ORDER — BUPIVACAINE LIPOSOME 1.3 % IJ SUSP
10.0000 mL | Freq: Once | INTRAMUSCULAR | Status: DC
Start: 2023-12-02 — End: 2023-12-02

## 2023-12-02 MED ORDER — METOCLOPRAMIDE HCL 5 MG PO TABS: 5.0000 mg | ORAL_TABLET | Freq: Three times a day (TID) | ORAL | Status: DC | PRN

## 2023-12-02 MED ORDER — ACETAMINOPHEN 325 MG PO TABS
325.0000 mg | ORAL_TABLET | Freq: Four times a day (QID) | ORAL | Status: DC | PRN
Start: 2023-12-03 — End: 2023-12-04
  Administered 2023-12-04: 650 mg via ORAL
  Filled 2023-12-02: qty 2

## 2023-12-02 MED ORDER — OXYCODONE HCL 5 MG/5ML PO SOLN: 5.0000 mg | Freq: Once | ORAL | Status: AC | PRN

## 2023-12-02 MED ORDER — CEFAZOLIN SODIUM-DEXTROSE 2-4 GM/100ML-% IV SOLN
2.0000 g | INTRAVENOUS | Status: AC
  Administered 2023-12-02: 2 g via INTRAVENOUS
  Filled 2023-12-02: qty 100

## 2023-12-02 MED ORDER — SODIUM CHLORIDE (PF) 0.9 % IJ SOLN
INTRAMUSCULAR | Status: AC
  Filled 2023-12-02: qty 20

## 2023-12-02 MED ORDER — POVIDONE-IODINE 10 % EX SWAB: 2.0000 | Freq: Once | CUTANEOUS | Status: DC

## 2023-12-02 MED ORDER — ONDANSETRON HCL 4 MG PO TABS
4.0000 mg | ORAL_TABLET | Freq: Four times a day (QID) | ORAL | Status: DC | PRN
Start: 2023-12-02 — End: 2023-12-04

## 2023-12-02 MED ORDER — OXYCODONE HCL 5 MG PO TABS
10.0000 mg | ORAL_TABLET | ORAL | Status: DC | PRN
  Administered 2023-12-02 – 2023-12-03 (×3): 10 mg via ORAL
  Filled 2023-12-02 (×3): qty 2

## 2023-12-02 MED ORDER — LACTATED RINGERS IV SOLN
INTRAVENOUS | Status: DC
Start: 2023-12-02 — End: 2023-12-02

## 2023-12-02 MED ORDER — CLOPIDOGREL BISULFATE 75 MG PO TABS
75.0000 mg | ORAL_TABLET | Freq: Every day | ORAL | Status: DC
Start: 2023-12-03 — End: 2023-12-04
  Administered 2023-12-03 – 2023-12-04 (×2): 75 mg via ORAL
  Filled 2023-12-02 (×2): qty 1

## 2023-12-02 MED ORDER — OXYCODONE HCL 5 MG PO TABS
ORAL_TABLET | ORAL | Status: AC
  Filled 2023-12-02: qty 1

## 2023-12-02 MED ORDER — DOCUSATE SODIUM 100 MG PO CAPS
100.0000 mg | ORAL_CAPSULE | Freq: Two times a day (BID) | ORAL | Status: DC
  Administered 2023-12-02 – 2023-12-04 (×4): 100 mg via ORAL
  Filled 2023-12-02 (×4): qty 1

## 2023-12-02 MED ORDER — RIVAROXABAN 10 MG PO TABS
10.0000 mg | ORAL_TABLET | Freq: Every day | ORAL | Status: DC
  Administered 2023-12-03 – 2023-12-04 (×2): 10 mg via ORAL
  Filled 2023-12-02 (×2): qty 1

## 2023-12-02 MED ORDER — METHOCARBAMOL 500 MG PO TABS
500.0000 mg | ORAL_TABLET | Freq: Four times a day (QID) | ORAL | Status: DC | PRN
  Administered 2023-12-03 – 2023-12-04 (×4): 500 mg via ORAL
  Filled 2023-12-02 (×5): qty 1

## 2023-12-02 MED ORDER — ROCURONIUM BROMIDE 10 MG/ML (PF) SYRINGE
PREFILLED_SYRINGE | INTRAVENOUS | Status: AC
Start: 2023-12-02 — End: ?
  Filled 2023-12-02: qty 10

## 2023-12-02 MED ORDER — HYDROMORPHONE HCL 1 MG/ML IJ SOLN
0.2500 mg | INTRAMUSCULAR | Status: DC | PRN
  Administered 2023-12-02 (×4): 0.5 mg via INTRAVENOUS

## 2023-12-02 MED ORDER — METHOCARBAMOL 1000 MG/10ML IJ SOLN
INTRAMUSCULAR | Status: AC
  Filled 2023-12-02: qty 10

## 2023-12-02 SURGICAL SUPPLY — 43 items
BAG COUNTER SPONGE SURGICOUNT (BAG) IMPLANT
BAG ZIPLOCK 12X15 (MISCELLANEOUS) IMPLANT
BIT DRILL TRIDENT 4X25 SU (BIT) IMPLANT
BLADE SAG 18X100X1.27 (BLADE) ×2 IMPLANT
BLADE SURG 15 STRL LF DISP TIS (BLADE) IMPLANT
CHLORAPREP W/TINT 26 (MISCELLANEOUS) ×2 IMPLANT
CLSR STERI-STRIP ANTIMIC 1/2X4 (GAUZE/BANDAGES/DRESSINGS) ×2 IMPLANT
COVER PERINEAL POST (MISCELLANEOUS) ×2 IMPLANT
COVER SURGICAL LIGHT HANDLE (MISCELLANEOUS) ×2 IMPLANT
DRAPE IMP U-DRAPE 54X76 (DRAPES) ×2 IMPLANT
DRAPE STERI IOBAN 125X83 (DRAPES) ×2 IMPLANT
DRAPE U-SHAPE 47X51 STRL (DRAPES) ×4 IMPLANT
DRSG MEPILEX POST OP 4X8 (GAUZE/BANDAGES/DRESSINGS) ×2 IMPLANT
ELECT REM PT RETURN 15FT ADLT (MISCELLANEOUS) ×2 IMPLANT
FACESHIELD WRAPAROUND (MASK) ×4
FACESHIELD WRAPAROUND OR TEAM (MASK) IMPLANT
GLOVE BIO SURGEON STRL SZ7.5 (GLOVE) ×2 IMPLANT
GLOVE BIOGEL PI IND STRL 7.5 (GLOVE) ×2 IMPLANT
GLOVE BIOGEL PI IND STRL 8 (GLOVE) ×2 IMPLANT
GLOVE SURG SYN 7.0 (GLOVE) ×1
GLOVE SURG SYN 7.0 PF PI (GLOVE) ×2 IMPLANT
GOWN STRL REUS W/ TWL LRG LVL3 (GOWN DISPOSABLE) ×2 IMPLANT
GOWN STRL REUS W/ TWL XL LVL3 (GOWN DISPOSABLE) ×2 IMPLANT
HEAD BIOLOX HIP 36/-5 (Joint) IMPLANT
HIP BIOLOX HD 36/-5 (Joint) ×1 IMPLANT
HOLDER FOLEY CATH W/STRAP (MISCELLANEOUS) IMPLANT
INSERT TRIDENT POLY 36 0DEG (Insert) IMPLANT
KIT TURNOVER KIT A (KITS) IMPLANT
MANIFOLD NEPTUNE II (INSTRUMENTS) ×2 IMPLANT
NS IRRIG 1000ML POUR BTL (IV SOLUTION) ×2 IMPLANT
PACK ANTERIOR HIP CUSTOM (KITS) ×2 IMPLANT
PROTECTOR NERVE ULNAR (MISCELLANEOUS) ×2 IMPLANT
SCREW HEX LP 6.5X20 (Screw) IMPLANT
SHELL ACETABUL CLUSTER SZ 54 (Shell) IMPLANT
SPIKE FLUID TRANSFER (MISCELLANEOUS) ×4 IMPLANT
STEM OFFSET SZ8 41X111 (Stem) IMPLANT
SUT MNCRL AB 3-0 PS2 18 (SUTURE) ×2 IMPLANT
SUT VIC AB 0 CT1 36 (SUTURE) ×2 IMPLANT
SUT VIC AB 1 CT1 36 (SUTURE) ×2 IMPLANT
SUT VIC AB 2-0 CT1 TAPERPNT 27 (SUTURE) ×2 IMPLANT
TRAY FOLEY MTR SLVR 16FR STAT (SET/KITS/TRAYS/PACK) ×2 IMPLANT
TUBE SUCTION HIGH CAP CLEAR NV (SUCTIONS) ×2 IMPLANT
WATER STERILE IRR 1000ML POUR (IV SOLUTION) ×4 IMPLANT

## 2023-12-02 NOTE — Transfer of Care (Signed)
 Immediate Anesthesia Transfer of Care Note  Patient: [name]  Procedure(s) Performed: TOTAL HIP ARTHROPLASTY ANTERIOR APPROACH (Left: Hip)  Patient Location: PACU  Anesthesia Type:General  Level of Consciousness: awake, alert , and oriented  Airway & Oxygen Therapy: Patient Spontanous Breathing  Post-op Assessment: Report given to RN, Post -op Vital signs reviewed and stable, and Patient moving all extremities  Post vital signs: Reviewed and stable  Last Vitals:  Vitals Value Taken Time  BP 156/74 12/02/23 1345  Temp    Pulse 70 12/02/23 1347  Resp 16 12/02/23 1347  SpO2 97 % 12/02/23 1347  Vitals shown include unfiled device data.  Last Pain:  Vitals:   12/02/23 0914  TempSrc:   PainSc: 8          Complications: No notable events documented.

## 2023-12-02 NOTE — Discharge Instructions (Addendum)
POST-OPERATIVE OPIOID TAPER INSTRUCTIONS: It is important to wean off of your opioid medication as soon as possible. If you do not need pain medication after your surgery it is ok to stop day one. Opioids include: Codeine, Hydrocodone(Norco, Vicodin), Oxycodone(Percocet, oxycontin) and hydromorphone amongst others.  Long term and even short term use of opiods can cause: Increased pain response Dependence Constipation Depression Respiratory depression And more.  Withdrawal symptoms can include Flu like symptoms Nausea, vomiting And more Techniques to manage these symptoms Hydrate well Eat regular healthy meals Stay active Use relaxation techniques(deep breathing, meditating, yoga) Do Not substitute Alcohol to help with tapering If you have been on opioids for less than two weeks and do not have pain than it is ok to stop all together.  Plan to wean off of opioids This plan should start within one week post op of your joint replacement. Maintain the same interval or time between taking each dose and first decrease the dose.  Cut the total daily intake of opioids by one tablet each day Next start to increase the time between doses. The last dose that should be eliminated is the evening dose.    ______________________________________________________  Information on my medicine - XARELTO (Rivaroxaban)  This medication education was reviewed with me or my healthcare representative as part of my discharge preparation.    Why was Xarelto prescribed for you? Xarelto was prescribed for you to reduce the risk of blood clots forming after orthopedic surgery. The medical term for these abnormal blood clots is venous thromboembolism (VTE).  What do you need to know about xarelto ? Take your Xarelto ONCE DAILY at the same time every day. You may take it either with or without food.  If you have difficulty swallowing the tablet whole, you may crush it and mix in applesauce just prior  to taking your dose.  Take Xarelto exactly as prescribed by your doctor and DO NOT stop taking Xarelto without talking to the doctor who prescribed the medication.  Stopping without other VTE prevention medication to take the place of Xarelto may increase your risk of developing a clot.  After discharge, you should have regular check-up appointments with your healthcare provider that is prescribing your Xarelto.    What do you do if you miss a dose? If you miss a dose, take it as soon as you remember on the same day then continue your regularly scheduled once daily regimen the next day. Do not take two doses of Xarelto on the same day.   Important Safety Information A possible side effect of Xarelto is bleeding. You should call your healthcare provider right away if you experience any of the following: Bleeding from an injury or your nose that does not stop. Unusual colored urine (red or dark brown) or unusual colored stools (red or black). Unusual bruising for unknown reasons. A serious fall or if you hit your head (even if there is no bleeding).  Some medicines may interact with Xarelto and might increase your risk of bleeding while on Xarelto. To help avoid this, consult your healthcare provider or pharmacist prior to using any new prescription or non-prescription medications, including herbals, vitamins, non-steroidal anti-inflammatory drugs (NSAIDs) and supplements.  This website has more information on Xarelto: [email or web address].br.

## 2023-12-02 NOTE — Evaluation (Signed)
 Physical Therapy Evaluation Patient Details Name: [name] MRN: [id number] DOB: Oct 20, 1956 Today's Date: 12/02/2023  History of Present Illness  68 yo male presents to therapy s/p L THA, anterior approach on 12/02/2023 due to failure of conservative measures. Pt PMH includes but is not limited to: alcohol abuse, CHF, DM II, GERD, hepatitis, HLD, HTN, OA, pancreatis, RA, CVA, tobacco abuse, and R THA (2027).  Clinical Impression   [name] is a 68 y.o. male POD 0 s/p L THA. Patient reports mod I with mobility at baseline. Patient is now limited by functional impairments (see PT problem list below) and requires min A for bed mobility and min A for transfers. Patient was able to side step 3 feet with RW and min A level of assist. Patient instructed in exercise to facilitate ROM and circulation to manage edema.  Patient will benefit from continued skilled PT interventions to address impairments and progress towards PLOF. Acute PT will follow to progress mobility and stair training in preparation for safe discharge home.       If plan is discharge home, recommend the following: A little help with walking and/or transfers;A little help with bathing/dressing/bathroom;Assistance with cooking/housework;Assist for transportation;Help with stairs or ramp for entrance   Can travel by private vehicle        Equipment Recommendations Rolling walker (2 wheels)  Recommendations for Other Services       Functional Status Assessment Patient has had a recent decline in their functional status and demonstrates the ability to make significant improvements in function in a reasonable and predictable amount of time.     Precautions / Restrictions Precautions Precautions: Fall Restrictions Weight Bearing Restrictions Per Provider Order: No      Mobility  Bed Mobility Overal bed mobility: Needs Assistance Bed Mobility: Supine to Sit, Sit to Supine     Supine to sit: Min assist, HOB  elevated, Used rails Sit to supine: Mod assist, Used rails   General bed mobility comments: cues, encouragement and PT managing L LE for sit <> supine    Transfers Overall transfer level: Needs assistance Equipment used: Rolling walker (2 wheels) Transfers: Sit to/from Stand Sit to Stand: Min assist           General transfer comment: min cues pull to stand    Ambulation/Gait Ambulation/Gait assistance: Min assist Gait Distance (Feet): 3 Feet Assistive device: Rolling walker (2 wheels) Gait Pattern/deviations: Step-to pattern, Antalgic Gait velocity: decreased     General Gait Details: side step to the L toward HOB with LOB x 1 with mod A to recover, specific cues for RW management and LE placement with encouragement  Stairs            Wheelchair Mobility     Tilt Bed    Modified Rankin (Stroke Patients Only)       Balance Overall balance assessment: Needs assistance, History of Falls Sitting-balance support: Feet supported Sitting balance-Leahy Scale: Fair     Standing balance support: Bilateral upper extremity supported, During functional activity, Reliant on assistive device for balance Standing balance-Leahy Scale: Poor                               Pertinent Vitals/Pain Pain Assessment Pain Assessment: 0-10 Pain Score: 9  Pain Location: L LE Pain Descriptors / Indicators: Burning, Aching, Grimacing, Discomfort, Heaviness, Moaning, Operative site guarding Pain Intervention(s): Limited activity within patient's tolerance, Monitored during session, Premedicated  before session, Repositioned, Patient requesting pain meds-RN notified, Ice applied    Home Living Family/patient expects to be discharged to:: Private residence Living Arrangements: Alone Available Help at Discharge: Family (daughter and brother able to assist at time of d/c) Type of Home: House Home Access: Stairs to enter Entrance Stairs-Rails: None Entrance Stairs-Number  of Steps: 1   Home Layout: One level Home Equipment: Rollator (4 wheels);Cane - single point      Prior Function Prior Level of Function : Independent/Modified Independent             Mobility Comments: pt reports intermittent use of SPC or rollator for mobility, mod I with all ADLs, self care tasks and IADLs       Extremity/Trunk Assessment        Lower Extremity Assessment Lower Extremity Assessment: LLE deficits/detail LLE Deficits / Details: ankle DF/PF 5/5 LLE Sensation: WNL    Cervical / Trunk Assessment Cervical / Trunk Assessment: Normal  Communication   Communication Communication: No apparent difficulties  Cognition Arousal: Alert Behavior During Therapy: WFL for tasks assessed/performed Overall Cognitive Status: Within Functional Limits for tasks assessed                                          General Comments      Exercises Total Joint Exercises Ankle Circles/Pumps: AROM, Both, 10 reps   Assessment/Plan    PT Assessment Patient needs continued PT services  PT Problem List Decreased strength;Decreased activity tolerance;Decreased range of motion;Decreased balance;Decreased mobility;Decreased coordination;Pain       PT Treatment Interventions DME instruction;Gait training;Stair training;Functional mobility training;Therapeutic activities;Therapeutic exercise;Balance training;Neuromuscular re-education;Patient/family education;Modalities    PT Goals (Current goals can be found in the Care Plan section)  Acute Rehab PT Goals 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 Formulation: With patient Time For Goal Achievement: 12/16/23 Potential to Achieve Goals: Fair    Frequency 7X/week     Co-evaluation               AM-PAC PT 6 Clicks Mobility  Outcome Measure Help needed turning from your back to your side while in a flat bed without using bedrails?: A Little Help needed moving from lying  on your back to sitting on the side of a flat bed without using bedrails?: A Little Help needed moving to and from a bed to a chair (including a wheelchair)?: A Little Help needed standing up from a chair using your arms (e.g., wheelchair or bedside chair)?: A Little Help needed to walk in hospital room?: A Little Help needed climbing 3-5 steps with a railing? : Total 6 Click Score: 16    End of Session Equipment Utilized During Treatment: Gait belt Activity Tolerance: Patient limited by pain Patient left: with call bell/phone within reach;in bed Nurse Communication: Mobility status;Patient requests pain meds PT Visit Diagnosis: Unsteadiness on feet (R26.81);Other abnormalities of gait and mobility (R26.89);Muscle weakness (generalized) (M62.81);History of falling (Z91.81);Difficulty in walking, not elsewhere classified (R26.2);Pain Pain - Right/Left: Left Pain - part of body: Hip;Knee;Leg    Time: 1740-1757 PT Time Calculation (min) (ACUTE ONLY): 17 min   Charges:   PT Evaluation $PT Eval Low Complexity: 1 Low   PT General Charges $$ ACUTE PT VISIT: 1 Visit         [place], PT Acute Rehab   [place] VEAR Drone  12/02/2023, 7:02 PM

## 2023-12-02 NOTE — Anesthesia Procedure Notes (Signed)
 Procedure Name: Intubation Date/Time: 12/02/2023 11:54 AM  Performed by: Harvey Josette CROME, CRNAPre-anesthesia Checklist: Patient identified, Emergency Drugs available, Suction available and Patient being monitored Patient Re-evaluated:Patient Re-evaluated prior to induction Oxygen Delivery Method: Circle system utilized Preoxygenation: Pre-oxygenation with 100% oxygen Induction Type: IV induction Ventilation: Mask ventilation without difficulty Laryngoscope Size: Mac and 4 Grade View: Grade II Tube type: Oral Tube size: 7.5 mm Number of attempts: 1 Airway Equipment and Method: Stylet Placement Confirmation: ETT inserted through vocal cords under direct vision, positive ETCO2 and breath sounds checked- equal and bilateral Secured at: 22 cm Tube secured with: Tape Dental Injury: Teeth and Oropharynx as per pre-operative assessment

## 2023-12-02 NOTE — Anesthesia Postprocedure Evaluation (Signed)
 Anesthesia Post Note  Patient: [name]  Procedure(s) Performed: TOTAL HIP ARTHROPLASTY ANTERIOR APPROACH (Left: Hip)     Patient location during evaluation: PACU Anesthesia Type: General Level of consciousness: sedated and patient cooperative Pain management: pain level controlled Vital Signs Assessment: post-procedure vital signs reviewed and stable Respiratory status: spontaneous breathing Cardiovascular status: stable Anesthetic complications: no   No notable events documented.  Last Vitals:  Vitals:   12/02/23 1500 12/02/23 1515  BP: (!) 159/79 (!) 150/76  Pulse: 64 71  Resp: 16 12  Temp:    SpO2: 99% 97%    Last Pain:  Vitals:   12/02/23 1520  TempSrc:   PainSc: 7                  Karmon Andis

## 2023-12-02 NOTE — Interval H&P Note (Signed)
 History and Physical Interval Note:  12/02/2023 10:27 AM  [name]  has presented today for surgery, with the diagnosis of OA LEFT HIP.  The various methods of treatment have been discussed with the patient and family. After consideration of risks, benefits and other options for treatment, the patient has consented to  Procedure(s): TOTAL HIP ARTHROPLASTY ANTERIOR APPROACH (Left) as a surgical intervention.  The patient's history has been reviewed, patient examined, no change in status, stable for surgery.  I have reviewed the patient's chart and labs.  Questions were answered to the patient's satisfaction.     [name]

## 2023-12-02 NOTE — Plan of Care (Signed)
 Patient reported severe pain in PACU and during his time here on [facility]. Staff will continue to monitor, medicate as appropriate, and educate.  Problem: Education: Goal: Knowledge of General Education information will improve Description: Including pain rating scale, medication(s)/side effects and non-pharmacologic comfort measures Outcome: Progressing   Problem: Clinical Measurements: Goal: Ability to maintain clinical measurements within normal limits will improve Outcome: Progressing   Problem: Activity: Goal: Risk for activity intolerance will decrease Outcome: Progressing   Problem: Coping: Goal: Level of anxiety will decrease Outcome: Progressing   Problem: Pain Managment: Goal: General experience of comfort will improve and/or be controlled Outcome: Progressing   Jon LULLA Reins, RN 12/02/23 7:47 PM

## 2023-12-02 NOTE — Plan of Care (Signed)
   Problem: Education: Goal: Knowledge of General Education information will improve Description Including pain rating scale, medication(s)/side effects and non-pharmacologic comfort measures Outcome: Progressing   Problem: Health Behavior/Discharge Planning: Goal: Ability to manage health-related needs will improve Outcome: Progressing

## 2023-12-02 NOTE — Op Note (Signed)
 12/02/2023  1:16 PM  PATIENT:  [name]   MRN: [id number]  PRE-OPERATIVE DIAGNOSIS:  OA LEFT HIP  POST-OPERATIVE DIAGNOSIS:  OA LEFT HIP  PROCEDURE:  Procedure(s): TOTAL HIP ARTHROPLASTY ANTERIOR APPROACH  PREOPERATIVE INDICATIONS:    [name] is an 68 y.o. male who has a diagnosis of S/P total left hip arthroplasty and elected for surgical management after failing conservative treatment.  The risks benefits and alternatives were discussed with the patient including but not limited to the risks of nonoperative treatment, versus surgical intervention including infection, bleeding, nerve injury, periprosthetic fracture, the need for revision surgery, dislocation, leg length discrepancy, blood clots, cardiopulmonary complications, morbidity, mortality, among others, and they were willing to proceed.     OPERATIVE REPORT     SURGEON:   [name], MD    ASSISTANT:  Gerard Large, PA-C, she was present and scrubbed throughout the case, critical for completion in a timely fashion, and for retraction, instrumentation, and closure.     ANESTHESIA:  General    COMPLICATIONS:  None.     COMPONENTS:  [employer] insignia femur size 8 with a 36 mm -5 head ball and an acetabular shell size 54 with a  polyethylene liner    PROCEDURE IN DETAIL:   The patient was met in the holding area and  identified.  The appropriate hip was identified and marked at the operative site.  The patient was then transported to the OR  and  placed under anesthesia per that record.  At that point, the patient was  placed in the supine position and  secured to the operating room table and all bony prominences padded. He received pre-operative antibiotics    The operative lower extremity was prepped from the iliac crest to the distal leg.  Sterile draping was performed.  Time out was performed prior to incision.      Skin incision was made just 2 cm lateral to the ASIS  extending in line with the tensor  fascia lata. Electrocautery was used to control all bleeders. I dissected down sharply to the fascia of the tensor fascia lata was confirmed that the muscle fibers beneath were running posteriorly. I then incised the fascia over the superficial tensor fascia lata in line with the incision. The fascia was elevated off the anterior aspect of the muscle the muscle was retracted posteriorly and protected throughout the case. I then used electrocautery to incise the tensor fascia lata fascia control and all bleeders. Immediately visible was the fat over top of the anterior neck and capsule.  I removed the anterior fat from the capsule and elevated the rectus muscle off of the anterior capsule. I then removed a large time of capsule. The retractors were then placed over the anterior acetabulum as well as around the superior and inferior neck.  I then made a femoral neck cut. Then used the power corkscrew to remove the femoral head from the acetabulum and thoroughly irrigated the acetabulum. I sized the femoral head.    I then exposed the deep acetabulum, cleared out any tissue including the ligamentum teres.   After adequate visualization, I excised the labrum, and then sequentially reamed.  I then impacted the acetabular implant into place using fluoroscopy for guidance.  Appropriate version and inclination was confirmed clinically matching their bony anatomy, and with fluoroscopy.  I placed a 20 mm screw in the posterior/superio position with an excellent bite.    I then placed the polyethylene liner in  place  I then adducted the leg and released the external rotators from the posterior femur allowing it to be easily delivered up lateral and anterior to the acetabulum for preparation of the femoral canal.    I then prepared the proximal femur using the cookie-cutter and then sequentially reamed and broached.  A trial broach, neck, and head was utilized, and I reduced the hip and used floroscopy to assess  the neck length and femoral implant.  I then impacted the femoral prosthesis into place into the appropriate version. The hip was then reduced and fluoroscopy confirmed appropriate position. Leg lengths were restored.  I then irrigated the hip copiously again with, and repaired the fascia with Vicryl, followed by monocryl for the subcutaneous tissue, Monocryl for the skin, Steri-Strips and sterile gauze. The patient was then awakened and returned to PACU in stable and satisfactory condition. There were no complications.  POST OPERATIVE PLAN: WBAT, DVT px: SCD's/TED, ambulation and chemical dvt px  Evalene Chancy, MD Orthopedic Surgeon [phone]

## 2023-12-03 ENCOUNTER — Encounter (HOSPITAL_COMMUNITY): Payer: Self-pay | Admitting: Orthopedic Surgery

## 2023-12-03 DIAGNOSIS — M1612 Unilateral primary osteoarthritis, left hip: Secondary | ICD-10-CM | POA: Diagnosis not present

## 2023-12-03 MED ORDER — OXYCODONE HCL 5 MG PO TABS: 10.0000 mg | ORAL_TABLET | Freq: Four times a day (QID) | ORAL | Status: DC

## 2023-12-03 MED ORDER — OXYCODONE HCL 5 MG PO TABS
5.0000 mg | ORAL_TABLET | ORAL | Status: DC | PRN
  Administered 2023-12-04: 10 mg via ORAL
  Filled 2023-12-03: qty 2
  Filled 2023-12-03: qty 1

## 2023-12-03 MED ORDER — OXYCODONE HCL 5 MG PO TABS
10.0000 mg | ORAL_TABLET | Freq: Four times a day (QID) | ORAL | Status: DC
  Administered 2023-12-03 – 2023-12-04 (×5): 10 mg via ORAL
  Filled 2023-12-03 (×5): qty 2

## 2023-12-03 NOTE — Plan of Care (Signed)
  Problem: Clinical Measurements: Goal: Ability to maintain clinical measurements within normal limits will improve Outcome: Progressing Goal: Will remain free from infection Outcome: Progressing Goal: Diagnostic test results will improve Outcome: Progressing Goal: Respiratory complications will improve Outcome: Progressing Goal: Cardiovascular complication will be avoided Outcome: Progressing   Problem: Activity: Goal: Risk for activity intolerance will decrease Outcome: Progressing   Problem: Nutrition: Goal: Adequate nutrition will be maintained Outcome: Progressing   Problem: Elimination: Goal: Will not experience complications related to bowel motility Outcome: Progressing Goal: Will not experience complications related to urinary retention Outcome: Progressing   Problem: Pain Managment: Goal: General experience of comfort will improve and/or be controlled Outcome: Progressing   Problem: Safety: Goal: Ability to remain free from injury will improve Outcome: Progressing   Problem: Skin Integrity: Goal: Risk for impaired skin integrity will decrease Outcome: Progressing   Problem: Education: Goal: Understanding of discharge needs will improve Outcome: Progressing   Problem: Activity: Goal: Ability to avoid complications of mobility impairment will improve Outcome: Progressing   Problem: Clinical Measurements: Goal: Postoperative complications will be avoided or minimized Outcome: Progressing   Problem: Pain Management: Goal: Pain level will decrease with appropriate interventions Outcome: Progressing   Problem: Skin Integrity: Goal: Will show signs of wound healing Outcome: Progressing

## 2023-12-03 NOTE — Discharge Summary (Signed)
 Physician Discharge Summary  Patient ID: KEONTAY VORA MRN: [id number] DOB/AGE: 01/31/1956 68 y.o.  Admit date: 12/02/2023 Discharge date: 12/04/2023  Admission Diagnoses:  Left hip osteoarthritis  Discharge Diagnoses:  Principal Problem:   S/P total left hip arthroplasty   Past Medical History:  Diagnosis Date   Alcohol abuse    Allergic rhinitis    CHF (congestive heart failure) (HCC)    Diabetes mellitus without complication (HCC)    'sometime last yr'--type 2   GERD (gastroesophageal reflux disease)    Hepatitis    he thinks its hep b or c   Hyperlipidemia    Hypertension    Osteoarthritis    Pancreatitis    Rheumatoid arthritis(714.0)    Stroke (HCC)    Tobacco user     Surgeries: Procedure(s): TOTAL HIP ARTHROPLASTY ANTERIOR APPROACH on 12/02/2023   Consultants (if any):   Discharged Condition: Improved  Hospital Course: JOHNATHA ZEIDMAN is an 68 y.o. male who was admitted 12/02/2023 with a diagnosis of left hip osteoarthritis and went to the operating room on 12/02/2023 and underwent the above named procedures.    He was given perioperative antibiotics:  Anti-infectives (From admission, onward)    Start     Dose/Rate Route Frequency Ordered Stop   12/02/23 1800  ceFAZolin  (ANCEF ) IVPB 2g/100 mL premix        2 g 200 mL/hr over 30 Minutes Intravenous Every 6 hours 12/02/23 1559 12/03/23 0805   12/02/23 0845  ceFAZolin  (ANCEF ) IVPB 2g/100 mL premix        2 g 200 mL/hr over 30 Minutes Intravenous On call to O.R. 12/02/23 9160 12/02/23 1154     .  He was given sequential compression devices, early ambulation, and Xarelto  for DVT prophylaxis.  He benefited maximally from the hospital stay and there were no complications.    Recent vital signs:  Vitals:   12/03/23 2059 12/04/23 0518  BP: (!) 153/80 (!) 147/71  Pulse: 83 71  Resp:  17  Temp:  98.2 F (36.8 C)  SpO2:  98%    Recent laboratory studies:  Lab Results  Component Value Date   HGB 14.2  11/19/2023   HGB 14.6 10/28/2023   HGB 18.3 (H) 06/20/2022   Lab Results  Component Value Date   WBC 10.1 11/19/2023   PLT 189 11/19/2023   Lab Results  Component Value Date   INR 1.1 05/14/2021   Lab Results  Component Value Date   NA 133 (L) 11/19/2023   K 3.6 11/19/2023   CL 103 11/19/2023   CO2 22 11/19/2023   BUN 9 11/19/2023   CREATININE 0.67 11/19/2023   GLUCOSE 126 (H) 11/19/2023    Discharge Medications:   Allergies as of 12/04/2023       Reactions   Lisinopril  Other (See Comments)   angioedema   Citalopram Nausea Only   Morphine  Nausea And Vomiting   Nsaids Other (See Comments)   Stomach Irritability    Paroxetine Nausea And Vomiting        Medication List     TAKE these medications    acetaminophen  500 MG tablet Commonly known as: TYLENOL  Take 2 tablets (1,000 mg total) by mouth every 6 (six) hours as needed for mild pain (pain score 1-3) or moderate pain (pain score 4-6).   amLODipine  10 MG tablet Commonly known as: NORVASC  Take 10 mg by mouth daily.   atorvastatin  40 MG tablet Commonly known as: LIPITOR Take 1 tablet (40 mg  total) by mouth daily. What changed:  how much to take when to take this   blood glucose meter kit and supplies Kit Dispense based on patient and insurance preference. Use up to four times daily as directed.   clopidogrel  75 MG tablet Commonly known as: PLAVIX  Take 75 mg by mouth daily.   metFORMIN  500 MG tablet Commonly known as: GLUCOPHAGE  Take 1 tablet (500 mg total) by mouth daily with breakfast.   methocarbamol  750 MG tablet Commonly known as: Robaxin -750 Take 1 tablet (750 mg total) by mouth every 8 (eight) hours as needed for muscle spasms.   metoprolol  tartrate 50 MG tablet Commonly known as: LOPRESSOR  Take 1 tablet (50 mg total) by mouth 2 (two) times daily. Take one two times a day for blood pressure. What changed: how much to take   ondansetron  4 MG disintegrating tablet Commonly known as:  ZOFRAN -ODT Take 1 tablet (4 mg total) by mouth every 8 (eight) hours as needed for nausea or vomiting.   oxyCODONE  5 MG immediate release tablet Commonly known as: Roxicodone  Take 1 tablet (5 mg total) by mouth every 4 (four) hours as needed for severe pain (pain score 7-10). after surgery that is not resolved by your normal daily dose of Oxycodone  for chronic pain   rivaroxaban  10 MG Tabs tablet Commonly known as: Xarelto  Take 1 tablet (10 mg total) by mouth daily. For 30 days to prevent blood clots after surgery   sennosides-docusate sodium  8.6-50 MG tablet Commonly known as: SENOKOT-S Take 2 tablets by mouth daily as needed for constipation (while taking narcotics).   tamsulosin  0.4 MG Caps capsule Commonly known as: FLOMAX  Take 2 capsules (0.8 mg total) by mouth daily. What changed: how much to take   traZODone  100 MG tablet Commonly known as: DESYREL  Take 1 tablet (100 mg total) by mouth at bedtime. Take one table by mouth at bedtime as needed for sleep.               Discharge Care Instructions  (From admission, onward)           Start     Ordered   12/04/23 0000  Weight bearing as tolerated        12/04/23 1327            Diagnostic Studies: DG HIP UNILAT WITH PELVIS 1V LEFT Result Date: 12/02/2023 CLINICAL DATA:  Intraoperative fluoroscopy for total left hip arthroplasty. EXAM: DG HIP (WITH OR WITHOUT PELVIS) 1V*L* COMPARISON:  Intraoperative fluoroscopy for total right hip arthroplasty 01/02/2016 FINDINGS: Images were performed intraoperatively without the presence of a radiologist. New total left hip arthroplasty. Redemonstration of total right hip arthroplasty, partially visualized. No hardware complication is seen. Total fluoroscopy images: 2 Total fluoroscopy time: 18 seconds Total dose: Radiation Exposure Index (as provided by the fluoroscopic device): 1.34 mGy air Kerma Please see intraoperative findings for further detail. IMPRESSION: Intraoperative  fluoroscopy for total left hip arthroplasty. Electronically Signed   By: Tanda Lyons M.D.   On: 12/02/2023 14:12   DG C-Arm 1-60 Min-No Report Result Date: 12/02/2023 Fluoroscopy was utilized by the requesting physician.  No radiographic interpretation.    Disposition: Discharge disposition: 01-Home or Self Care       Discharge Instructions     Call MD / Call 911   Complete by: As directed    If you experience chest pain or shortness of breath, CALL 911 and be transported to the hospital emergency room.  If you develope a fever  above 101 F, pus (white drainage) or increased drainage or redness at the wound, or calf pain, call your surgeon's office.   Diet - low sodium heart healthy   Complete by: As directed    Discharge instructions   Complete by: As directed    You may bear weight as tolerated. Keep your dressing on and dry until follow up. Take medicine to prevent blood clots as directed. Take pain medicine as needed with the goal of transitioning to over the counter medicines. It is okay to take 2 tablets of your narcotic medicine instead of just 1 tablet in the first 1-2 days after surgery when pain is at the worst. Do not continue to do this for multiple days at a time beyond that though.    INSTRUCTIONS AFTER JOINT REPLACEMENT   Remove items at home which could result in a fall. This includes throw rugs or furniture in walking pathways ICE to the affected joint every three hours while awake for 30 minutes at a time, for at least the first 3-5 days, and then as needed for pain and swelling.  Continue to use ice for pain and swelling. You may notice swelling that will progress down to the foot and ankle.  This is normal after surgery.  Elevate your leg when you are not up walking on it.   Continue to use the breathing machine you got in the hospital (incentive spirometer) which will help keep your temperature down.  It is common for your temperature to cycle up and down  following surgery, especially at night when you are not up moving around and exerting yourself.  The breathing machine keeps your lungs expanded and your temperature down.   DIET:  As you were doing prior to hospitalization, we recommend a well-balanced diet.  DRESSING / WOUND CARE / SHOWERING  You may shower 3 days after surgery, but keep the wounds dry during showering.  You may use an occlusive plastic wrap (Press'n Seal for example) with blue painter's tape at edges, NO SOAKING/SUBMERGING IN THE BATHTUB.  If the bandage gets wet, call the office.   ACTIVITY  Increase activity slowly as tolerated, but follow the weight bearing instructions below.   No driving for 6 weeks or until further direction given by your physician.  You cannot drive while taking narcotics.  No lifting or carrying greater than 10 lbs. until further directed by your surgeon. Avoid periods of inactivity such as sitting longer than an hour when not asleep. This helps prevent blood clots.  You may return to work once you are authorized by your doctor.    WEIGHT BEARING   Weight bearing as tolerated with assist device (walker, cane, etc) as directed, use it as long as suggested by your surgeon or therapist, typically at least 4-6 weeks.   EXERCISES  Results after joint replacement surgery are often greatly improved when you follow the exercise, range of motion and muscle strengthening exercises prescribed by your doctor. Safety measures are also important to protect the joint from further injury. Any time any of these exercises cause you to have increased pain or swelling, decrease what you are doing until you are comfortable again and then slowly increase them. If you have problems or questions, call your caregiver or physical therapist for advice.   Rehabilitation is important following a joint replacement. After just a few days of immobilization, the muscles of the leg can become weakened and shrink (atrophy).   These exercises are designed to  build up the tone and strength of the thigh and leg muscles and to improve motion. Often times heat used for twenty to thirty minutes before working out will loosen up your tissues and help with improving the range of motion but do not use heat for the first two weeks following surgery (sometimes heat can increase post-operative swelling).   These exercises can be done on a training (exercise) mat, on the floor, on a table or on a bed. Use whatever works the best and is most comfortable for you.    Use music or television while you are exercising so that the exercises are a pleasant break in your day. This will make your life better with the exercises acting as a break in your routine that you can look forward to.   Perform all exercises about fifteen times, three times per day or as directed.  You should exercise both the operative leg and the other leg as well.  Exercises include:   Quad Sets - Tighten up the muscle on the front of the thigh (Quad) and hold for 5-10 seconds.   Straight Leg Raises - With your knee straight (if you were given a brace, keep it on), lift the leg to 60 degrees, hold for 3 seconds, and slowly lower the leg.  Perform this exercise against resistance later as your leg gets stronger.  Leg Slides: Lying on your back, slowly slide your foot toward your buttocks, bending your knee up off the floor (only go as far as is comfortable). Then slowly slide your foot back down until your leg is flat on the floor again.  Angel Wings: Lying on your back spread your legs to the side as far apart as you can without causing discomfort.  Hamstring Strength:  Lying on your back, push your heel against the floor with your leg straight by tightening up the muscles of your buttocks.  Repeat, but this time bend your knee to a comfortable angle, and push your heel against the floor.  You may put a pillow under the heel to make it more comfortable if necessary.   A  rehabilitation program following joint replacement surgery can speed recovery and prevent re-injury in the future due to weakened muscles. Contact your doctor or a physical therapist for more information on knee rehabilitation.    CONSTIPATION  Constipation is defined medically as fewer than three stools per week and severe constipation as less than one stool per week.  Even if you have a regular bowel pattern at home, your normal regimen is likely to be disrupted due to multiple reasons following surgery.  Combination of anesthesia, postoperative narcotics, change in appetite and fluid intake all can affect your bowels.   YOU MUST use at least one of the following options; they are listed in order of increasing strength to get the job done.  They are all available over the counter, and you may need to use some, POSSIBLY even all of these options:    Drink plenty of fluids (prune juice may be helpful) and high fiber foods Colace 100 mg by mouth twice a day  Senokot for constipation as directed and as needed Dulcolax (bisacodyl ), take with full glass of water  Miralax  (polyethylene glycol) once or twice a day as needed.  If you have tried all these things and are unable to have a bowel movement in the first 3-4 days after surgery call either your surgeon or your primary doctor.    If you  experience loose stools or diarrhea, hold the medications until you stool forms back up.  If your symptoms do not get better within 1 week or if they get worse, check with your doctor.  If you experience the worst abdominal pain ever or develop nausea or vomiting, please contact the office immediately for further recommendations for treatment.   ITCHING:  If you experience itching with your medications, try taking only a single pain pill, or even half a pain pill at a time.  You can also use Benadryl  over the counter for itching or also to help with sleep.   TED HOSE STOCKINGS:  Use stockings on both legs until  for at least 2 weeks or as directed by physician office. They may be removed at night for sleeping.  MEDICATIONS:  See your medication summary on the After Visit Summary that nursing will review with you.  You may have some home medications which will be placed on hold until you complete the course of blood thinner medication.  It is important for you to complete the blood thinner medication as prescribed.  Take medicines as prescribed.   You have several different medicines that work in different ways. - Tylenol  is for mild to moderate pain. Try to take this medicine before turning to your narcotic medicines.  - Robaxin  is for muscle spasms. This medicine can make you drowsy. - Oxycodone  is a narcotic pain medicine.  Take this for severe pain. This medicine can be dehydrating / constipating. - Zofran  is for nausea and vomiting. - Senokot is for constipation prevention while taking pain medicine.  - Xarelto  is to prevent blood clots after surgery. YOU MUST TAKE THIS MEDICINE!!  PRECAUTIONS:  If you experience chest pain or shortness of breath - call 911 immediately for transfer to the hospital emergency department.   If you develop a fever greater that 101 F, purulent drainage from wound, increased redness or drainage from wound, foul odor from the wound/dressing, or calf pain - CONTACT YOUR SURGEON.                                                   FOLLOW-UP APPOINTMENTS:  If you do not already have a post-op appointment, please call the office [phone] for an appointment to be seen by Dr. Beverley in 2 weeks.   OTHER INSTRUCTIONS:   MAKE SURE YOU:  Understand these instructions.  Get help right away if you are not doing well or get worse.    Thank you for letting us  be a part of your medical care team.  It is a privilege we respect greatly.  We hope these instructions will help you stay on track for a fast and full recovery!   Driving restrictions   Complete by: As directed    No  driving for 2-4 weeks   Post-operative opioid taper instructions:   Complete by: As directed    POST-OPERATIVE OPIOID TAPER INSTRUCTIONS: It is important to wean off of your opioid medication as soon as possible. If you do not need pain medication after your surgery it is ok to stop day one. Opioids include: Codeine , Hydrocodone (Norco, Vicodin), Oxycodone (Percocet, oxycontin ) and hydromorphone  amongst others.  Long term and even short term use of opiods can cause: Increased pain response Dependence Constipation Depression Respiratory depression And more.  Withdrawal symptoms can include Flu  like symptoms Nausea, vomiting And more Techniques to manage these symptoms Hydrate well Eat regular healthy meals Stay active Use relaxation techniques(deep breathing, meditating, yoga) Do Not substitute Alcohol to help with tapering If you have been on opioids for less than two weeks and do not have pain than it is ok to stop all together.  Plan to wean off of opioids This plan should start within one week post op of your joint replacement. Maintain the same interval or time between taking each dose and first decrease the dose.  Cut the total daily intake of opioids by one tablet each day Next start to increase the time between doses. The last dose that should be eliminated is the evening dose.      TED hose   Complete by: As directed    Use stockings (TED hose) for 2 weeks on left leg(s).  You may remove them at night for sleeping.   Weight bearing as tolerated   Complete by: As directed         Follow-up Information     Beverley Evalene BIRCH, MD. Go on 12/17/2023.   Specialty: Orthopedic Surgery Why: Your appointment is at [facility] information: [address] [place] [address] [phone]         [employer] [facility]. Call.   Contact information: [address] [place], [place] [address] [phone]        [facility]  Follow up.   Contact information: [address] [name], [place] [address] [phone]        [employer] [facility] [employer]. Call.   Contact information: [address] [place], [place] [address] [phone]        [employer] [facility]. Call.   Contact information: [address] [place], [place] [address] [phone]        [facility]. Call.   Contact information: [address] [place], [place] [address] [phone]                 Signed: Gerard CHRISTELLA Large PA-C Office [phone] 12/04/2023, 2:19 PM

## 2023-12-03 NOTE — Progress Notes (Signed)
    Subjective: Patient reports pain as moderate.  Tolerating diet.  Urinating.   No CP, SOB.  Has mobilized a very small amount OOB with PT yesterday.  Objective:   VITALS:   Vitals:   12/02/23 2016 12/03/23 0145 12/03/23 0452 12/03/23 0949  BP: (!) 171/84 (!) 152/79 (!) 155/84 (!) 170/75  Pulse: 86 64 73 66  Resp: 16 15 15 17   Temp: 98 F (36.7 C) 98 F (36.7 C) 98.2 F (36.8 C) 98.1 F (36.7 C)  TempSrc: Oral Oral Oral Oral  SpO2: 99% 100% 98% 99%  Weight:      Height:          Latest Ref Rng & Units 11/19/2023    1:46 PM 10/28/2023   10:13 AM 06/20/2022   10:38 AM  CBC  WBC 4.0 - 10.5 K/uL 10.1  7.9  8.0   Hemoglobin 13.0 - 17.0 g/dL 85.7  85.3  81.6   Hematocrit 39.0 - 52.0 % 41.6  42.2  52.7   Platelets 150 - 400 K/uL 189  273  233       Latest Ref Rng & Units 11/19/2023    1:46 PM 10/28/2023   10:13 AM 06/20/2022   10:38 AM  BMP  Glucose 70 - 99 mg/dL 873  893  866   BUN 8 - 23 mg/dL 9  5  12    Creatinine 0.61 - 1.24 mg/dL 9.32  9.23  9.10   BUN/Creat Ratio 10 - 24  7  13    Sodium 135 - 145 mmol/L 133  137  137   Potassium 3.5 - 5.1 mmol/L 3.6  3.6  4.9   Chloride 98 - 111 mmol/L 103  99  99   CO2 22 - 32 mmol/L 22  22  22    Calcium  8.9 - 10.3 mg/dL 9.1  9.6  89.4    Intake/Output      02/04 0701 02/05 0700 02/05 0701 02/06 0700   P.O. 810 240   I.V. (mL/kg) 1200 (16.9)    IV Piggyback 200    Total Intake(mL/kg) 2210 (31.2) 240 (3.4)   Urine (mL/kg/hr) 3350    Blood 200    Total Output 3550    Net -1340 +240        Urine Occurrence 1 x 2 x      Physical Exam: General: NAD.  Sitting up in bed, eating breakfast, calm, comfortable Resp: No increased wob Cardio: regular rate and rhythm ABD soft Neurologically intact MSK Neurovascularly intact Sensation intact distally Intact pulses distally Dorsiflexion/Plantar flexion intact Incision: dressing C/D/I   Assessment: 1 Day Post-Op  S/P Procedure(s) (LRB): TOTAL HIP ARTHROPLASTY ANTERIOR  APPROACH (Left) by Dr. Evalene BIRCH. Beverley on 12/02/23  Principal Problem:   S/P total left hip arthroplasty   Plan:  Advance diet Up with therapy Incentive Spirometry Elevate and Apply ice  Weightbearing: WBAT LLE Insicional and dressing care: Dressings left intact until follow-up and Reinforce dressings as needed Orthopedic device(s): None Showering: Keep dressing dry VTE prophylaxis:  Xarelto  10mg  daily x 30 days  , SCDs, ambulation Pain control: PRN Follow - up plan: 2 weeks Contact information:  Evalene Beverley MD, Gerard Large PA-C  Dispo: Home hopefully later today if passes PT evaluation     Gerard CHRISTELLA Large DEVONNA Office [phone] 12/03/2023, 10:07 AM

## 2023-12-03 NOTE — Care Management Obs Status (Signed)
MEDICARE OBSERVATION STATUS NOTIFICATION   Patient Details  Name: [name] MRN: [id number] Date of Birth: 10/01/1956   Medicare Observation Status Notification Given:  Yes    Ewing Schlein, LCSW 12/03/2023, 10:54 AM

## 2023-12-03 NOTE — Progress Notes (Signed)
 Assuming care for this patient from off-going RN. Agree w/ previously charted shift assessment/daily documentation. Will continue care until 1930.

## 2023-12-03 NOTE — Plan of Care (Signed)
 Problem: Education: Goal: Knowledge of General Education information will improve Description: Including pain rating scale, medication(s)/side effects and non-pharmacologic comfort measures 12/03/2023 0819 by Rosanne Jon GAILS, RN Outcome: Progressing   Problem: Clinical Measurements: Goal: Ability to maintain clinical measurements within normal limits will improve 12/03/2023 0819 by Rosanne Jon GAILS, RN Outcome: Progressing   Problem: Activity: Goal: Risk for activity intolerance will decrease 12/03/2023 0819 by Rosanne Jon GAILS, RN Outcome: Progressing   Problem: Pain Managment: Goal: General experience of comfort will improve and/or be controlled 12/03/2023 0819 by Rosanne Jon GAILS, RN Outcome: Progressing   Jon GAILS Rosanne, RN 12/03/23 8:19 AM

## 2023-12-03 NOTE — Progress Notes (Signed)
 Physical Therapy Treatment Patient Details Name: [name] MRN: [id number] DOB: [birth year] Today's Date: 12/03/2023   History of Present Illness 68 yo male presents to therapy s/p L THA, anterior approach on 12/02/2023 due to failure of conservative measures. Pt PMH includes but is not limited to: alcohol abuse, CHF, DM II, GERD, hepatitis, HLD, HTN, OA, pancreatis, RA, CVA, tobacco abuse, and R THA (2027).    PT Comments  Pt seen for PT tx with pt agreeable. Pt is able to ambulate increased distances with RW & CGA fade to close supervision. Pt negotiates single step without rails with RW & min assist to simulate single step entry in to his house. Pt reports comfort with stair negotiation. Will continue to follow pt acutely to address strengthening, activity tolerance, & gait.    If plan is discharge home, recommend the following: A little help with walking and/or transfers;A little help with bathing/dressing/bathroom;Assistance with cooking/housework;Assist for transportation;Help with stairs or ramp for entrance   Can travel by private vehicle        Equipment Recommendations  Rolling walker (2 wheels)    Recommendations for Other Services       Precautions / Restrictions Precautions Precautions: Fall Restrictions Weight Bearing Restrictions Per Provider Order: Yes LLE Weight Bearing Per Provider Order: Weight bearing as tolerated     Mobility  Bed Mobility Overal bed mobility: Needs Assistance Bed Mobility: Sit to Supine     Supine to sit: Min assist, Used rails, HOB elevated (exit L side of bed, PT supported LLE) Sit to supine: Min assist (assistance to elevate LLE into bed)        Transfers Overall transfer level: Needs assistance Equipment used: Rolling walker (2 wheels) Transfers: Sit to/from Stand Sit to Stand: Contact guard assist, Min assist           General transfer comment: good awareness of hand placement, overall technique for STS     Ambulation/Gait Ambulation/Gait assistance: Contact guard assist, Supervision Gait Distance (Feet): 150 Feet (+ 150 ft) Assistive device: Rolling walker (2 wheels) Gait Pattern/deviations: Decreased stride length, Decreased step length - right, Decreased step length - left, Step-through pattern Gait velocity: decreased     General Gait Details: pt ambulates room<>gym, reminders to push RW vs picking it up   Stairs Stairs: Yes Stairs assistance: Min assist Stair Management: No rails, With walker, Step to pattern Number of Stairs: 2 (6) General stair comments: Pt negotiates platform step with RW & min assist with PT providing education & demonstration prior to PT attempting.   Wheelchair Mobility     Tilt Bed    Modified Rankin (Stroke Patients Only)       Balance Overall balance assessment: Needs assistance, History of Falls Sitting-balance support: Feet supported Sitting balance-Leahy Scale: Fair     Standing balance support: Bilateral upper extremity supported, During functional activity, Reliant on assistive device for balance Standing balance-Leahy Scale: Fair                              Cognition Arousal: Alert Behavior During Therapy: WFL for tasks assessed/performed Overall Cognitive Status: Within Functional Limits for tasks assessed                                 General Comments: Pleasant, willing to participate        Exercises Total Joint Exercises Long  Arc Quad: AROM, Seated, Strengthening, Left, 10 reps (pt supports LLE under L thigh)    General Comments        Pertinent Vitals/Pain Pain Assessment Pain Assessment: Faces Pain Score: 8  Faces Pain Scale: Hurts whole lot Pain Location: L hip Pain Descriptors / Indicators: Grimacing, Discomfort, Guarding Pain Intervention(s): Limited activity within patient's tolerance, Monitored during session    Home Living                          Prior  Function            PT Goals (current goals can now be found in the care plan section) Acute Rehab PT Goals 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 Formulation: With patient Time For Goal Achievement: 12/16/23 Potential to Achieve Goals: Fair Progress towards PT goals: Progressing toward goals    Frequency    7X/week      PT Plan      Co-evaluation              AM-PAC PT 6 Clicks Mobility   Outcome Measure  Help needed turning from your back to your side while in a flat bed without using bedrails?: None Help needed moving from lying on your back to sitting on the side of a flat bed without using bedrails?: A Little Help needed moving to and from a bed to a chair (including a wheelchair)?: A Little Help needed standing up from a chair using your arms (e.g., wheelchair or bedside chair)?: A Little Help needed to walk in hospital room?: A Little Help needed climbing 3-5 steps with a railing? : A Little 6 Click Score: 19    End of Session Equipment Utilized During Treatment: Gait belt Activity Tolerance: Patient limited by pain;Patient tolerated treatment well Patient left: in bed;with call bell/phone within reach;with bed alarm set;with nursing/sitter in room Nurse Communication: Mobility status PT Visit Diagnosis: Unsteadiness on feet (R26.81);Other abnormalities of gait and mobility (R26.89);Muscle weakness (generalized) (M62.81);History of falling (Z91.81);Difficulty in walking, not elsewhere classified (R26.2);Pain Pain - Right/Left: Left Pain - part of body: Hip     Time: 1342-1400 PT Time Calculation (min) (ACUTE ONLY): 18 min  Charges:    $Therapeutic Activity: 8-22 mins PT General Charges $$ ACUTE PT VISIT: 1 Visit                     Richerd Pinal, PT, DPT 12/03/23, 2:07 PM   Richerd CHRISTELLA Pinal 12/03/2023, 2:06 PM

## 2023-12-03 NOTE — TOC Transition Note (Signed)
 Transition of Care [facility]) - Discharge Note  Patient Details  Name: [name] MRN: [id number] Date of Birth: [birth year]  Transition of Care [facility]) CM/SW Contact:  Duwaine GORMAN Aran, LCSW Phone Number: 12/03/2023, 11:18 AM  Clinical Narrative: Patient is expected to discharge home after working with PT. CSW spoke with patient to discuss discharge plan and needs. Patient will go home with OPPT. Patient confirmed he will need a rolling walker and 3N1, which [employer] delivered to patient's room. Patient agreeable to having food pantry resources added to AVS. TOC signing off.  Final next level of care: OP Rehab Barriers to Discharge: No Barriers Identified  Patient Goals and CMS Choice Patient states their goals for this hospitalization and ongoing recovery are:: Get DME for home use CMS Medicare.gov Compare Post Acute Care list provided to:: Patient Choice offered to / list presented to : Patient  Discharge Plan and Services Additional resources added to the After Visit Summary for        DME Arranged: 3-N-1, Walker rolling DME Agency: [employer] Date DME Agency Contacted: 12/03/23 Time DME Agency Contacted: [phone] Representative spoke with at DME Agency: Kaitlyn  Social Drivers of Health (SDOH) Interventions SDOH Screenings   Food Insecurity: Food Insecurity Present (12/02/2023)  Housing: Low Risk  (12/02/2023)  Transportation Needs: No Transportation Needs (12/02/2023)  Utilities: Not At Risk (12/02/2023)  Alcohol Screen: Low Risk  (07/10/2023)  Depression (PHQ2-9): Low Risk  (10/28/2023)  Financial Resource Strain: Low Risk  (07/10/2023)  Physical Activity: Sufficiently Active (07/10/2023)  Social Connections: Moderately Isolated (12/02/2023)  Stress: No Stress Concern Present (07/10/2023)  Tobacco Use: High Risk (12/02/2023)  Health Literacy: Adequate Health Literacy (07/10/2023)   Readmission Risk Interventions     No data to display

## 2023-12-03 NOTE — Progress Notes (Signed)
 Physical Therapy Treatment Patient Details Name: [name] MRN: [id number] DOB: January 18, 1956 Today's Date: 12/03/2023   History of Present Illness 68 yo male presents to therapy s/p L THA, anterior approach on 12/02/2023 due to failure of conservative measures. Pt PMH includes but is not limited to: alcohol abuse, CHF, DM II, GERD, hepatitis, HLD, HTN, OA, pancreatis, RA, CVA, tobacco abuse, and R THA (2027).    PT Comments  Pt seen for PT tx with pt agreeable. Pt endorsing significant pain in L hip despite being premedicated earlier. Pt requires min assist to support LLE during supine>sit, min assist for STS, & CGA for gait. Pt is able to increase ambulation distances but is still limited by pain. Pt agreeable to attempting single step negotiation this afternoon as pt has 1 step to enter his home.     If plan is discharge home, recommend the following: A little help with walking and/or transfers;A little help with bathing/dressing/bathroom;Assistance with cooking/housework;Assist for transportation;Help with stairs or ramp for entrance   Can travel by private vehicle        Equipment Recommendations  Rolling walker (2 wheels)    Recommendations for Other Services       Precautions / Restrictions Precautions Precautions: Fall Restrictions Weight Bearing Restrictions Per Provider Order: Yes LLE Weight Bearing Per Provider Order: Weight bearing as tolerated     Mobility  Bed Mobility Overal bed mobility: Needs Assistance Bed Mobility: Supine to Sit     Supine to sit: Min assist, Used rails, HOB elevated (exit L side of bed, PT supported LLE)          Transfers Overall transfer level: Needs assistance Equipment used: Rolling walker (2 wheels) Transfers: Sit to/from Stand Sit to Stand: Min assist           General transfer comment: STS from EOB, recliner, cuing re: hand placement with good return demo    Ambulation/Gait Ambulation/Gait assistance: Contact guard  assist Gait Distance (Feet): 40 Feet (+ 50 ft) Assistive device: Rolling walker (2 wheels) Gait Pattern/deviations: Decreased stride length, Decreased step length - right, Decreased step length - left Gait velocity: decreased     General Gait Details: Education to push vs pick up RW with good return demo. Decreased weith shift to LLE in stance phase 2/2 pain.   Stairs             Wheelchair Mobility     Tilt Bed    Modified Rankin (Stroke Patients Only)       Balance Overall balance assessment: Needs assistance, History of Falls   Sitting balance-Leahy Scale: Fair     Standing balance support: Bilateral upper extremity supported, During functional activity, Reliant on assistive device for balance Standing balance-Leahy Scale: Fair                              Cognition Arousal: Alert Behavior During Therapy: WFL for tasks assessed/performed Overall Cognitive Status: Within Functional Limits for tasks assessed                                 General Comments: Pleasant, willing to participate        Exercises Total Joint Exercises Long Arc Quad: AROM, Seated, Strengthening, Left, 10 reps (pt supports LLE under L thigh)    General Comments        Pertinent Vitals/Pain Pain Assessment  Pain Assessment: 0-10 Pain Score: 8  Pain Location: L hip Pain Descriptors / Indicators: Grimacing, Discomfort, Guarding Pain Intervention(s): Monitored during session, Premedicated before session, Limited activity within patient's tolerance    Home Living                          Prior Function            PT Goals (current goals can now be found in the care plan section) Acute Rehab PT Goals 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 Formulation: With patient Time For Goal Achievement: 12/16/23 Potential to Achieve Goals: Fair Progress towards PT goals: Progressing toward goals    Frequency     7X/week      PT Plan      Co-evaluation              AM-PAC PT 6 Clicks Mobility   Outcome Measure  Help needed turning from your back to your side while in a flat bed without using bedrails?: None Help needed moving from lying on your back to sitting on the side of a flat bed without using bedrails?: A Little Help needed moving to and from a bed to a chair (including a wheelchair)?: A Little Help needed standing up from a chair using your arms (e.g., wheelchair or bedside chair)?: A Little Help needed to walk in hospital room?: A Little Help needed climbing 3-5 steps with a railing? : A Lot 6 Click Score: 18    End of Session Equipment Utilized During Treatment: Gait belt Activity Tolerance: Patient limited by pain Patient left: in chair;with call bell/phone within reach Nurse Communication: Mobility status PT Visit Diagnosis: Unsteadiness on feet (R26.81);Other abnormalities of gait and mobility (R26.89);Muscle weakness (generalized) (M62.81);History of falling (Z91.81);Difficulty in walking, not elsewhere classified (R26.2);Pain Pain - Right/Left: Left Pain - part of body: Hip     Time: 8870-8852 PT Time Calculation (min) (ACUTE ONLY): 18 min  Charges:    $Therapeutic Activity: 8-22 mins PT General Charges $$ ACUTE PT VISIT: 1 Visit                     Richerd Pinal, PT, DPT 12/03/23, 12:06 PM    Richerd CHRISTELLA Pinal 12/03/2023, 12:05 PM

## 2023-12-04 DIAGNOSIS — M1612 Unilateral primary osteoarthritis, left hip: Secondary | ICD-10-CM | POA: Diagnosis not present

## 2023-12-04 MED ORDER — SENNA-DOCUSATE SODIUM 8.6-50 MG PO TABS: 2.0000 | ORAL_TABLET | Freq: Every day | ORAL | 1 refills | Status: DC | PRN

## 2023-12-04 MED ORDER — METHOCARBAMOL 750 MG PO TABS: 750.0000 mg | ORAL_TABLET | Freq: Three times a day (TID) | ORAL | 0 refills | Status: DC | PRN

## 2023-12-04 MED ORDER — ACETAMINOPHEN 500 MG PO TABS: 1000.0000 mg | ORAL_TABLET | Freq: Four times a day (QID) | ORAL | 0 refills | Status: DC | PRN

## 2023-12-04 MED ORDER — RIVAROXABAN 10 MG PO TABS: 10.0000 mg | ORAL_TABLET | Freq: Every day | ORAL | 0 refills | Status: DC

## 2023-12-04 MED ORDER — OXYCODONE HCL 5 MG PO TABS: 5.0000 mg | ORAL_TABLET | ORAL | 0 refills | Status: DC | PRN

## 2023-12-04 MED ORDER — ONDANSETRON 4 MG PO TBDP: 4.0000 mg | ORAL_TABLET | Freq: Three times a day (TID) | ORAL | 0 refills | Status: DC | PRN

## 2023-12-04 NOTE — Progress Notes (Signed)
 Physical Therapy Treatment Patient Details Name: [name] MRN: [id number] DOB: 1955-11-21 Today's Date: 12/04/2023   History of Present Illness 68 yo male presents to therapy s/p L THA, anterior approach on 12/02/2023 due to failure of conservative measures. Pt PMH includes but is not limited to: alcohol abuse, CHF, DM II, GERD, hepatitis, HLD, HTN, OA, pancreatis, RA, CVA, tobacco abuse, and R THA (2027).    PT Comments  Pt motivated and progressing well with mobility.  Pt up to ambulate in halls, HEP initiated with written instruction provided, and car transfers reviewed.  Pt eager for dc home this date.    If plan is discharge home, recommend the following: A little help with walking and/or transfers;A little help with bathing/dressing/bathroom;Assistance with cooking/housework;Assist for transportation;Help with stairs or ramp for entrance   Can travel by private vehicle        Equipment Recommendations  Rolling walker (2 wheels)    Recommendations for Other Services       Precautions / Restrictions Precautions Precautions: Fall Restrictions Weight Bearing Restrictions Per Provider Order: No LLE Weight Bearing Per Provider Order: Weight bearing as tolerated     Mobility  Bed Mobility Overal bed mobility: Needs Assistance Bed Mobility: Supine to Sit, Sit to Supine     Supine to sit: Contact guard Sit to supine: Contact guard assist   General bed mobility comments: cues for sequence and use of R LE to self assist.  Pt using gait belt to self assist L LE    Transfers Overall transfer level: Needs assistance Equipment used: Rolling walker (2 wheels) Transfers: Sit to/from Stand Sit to Stand: Contact guard assist, Supervision           General transfer comment: good awareness of hand placement, overall technique for STS    Ambulation/Gait Ambulation/Gait assistance: Contact guard assist, Supervision Gait Distance (Feet): 140 Feet Assistive device: Rolling  walker (2 wheels) Gait Pattern/deviations: Decreased stride length, Decreased step length - right, Decreased step length - left, Step-through pattern Gait velocity: decreased     General Gait Details: min cues for posture and position from RW   Stairs         General stair comments: Pt declines to attempt - feels comfortable with practice yesterday   Wheelchair Mobility     Tilt Bed    Modified Rankin (Stroke Patients Only)       Balance Overall balance assessment: Needs assistance, History of Falls Sitting-balance support: Feet supported Sitting balance-Leahy Scale: Good     Standing balance support: No upper extremity supported Standing balance-Leahy Scale: Fair                              Cognition Arousal: Alert Behavior During Therapy: WFL for tasks assessed/performed Overall Cognitive Status: Within Functional Limits for tasks assessed                                 General Comments: Pleasant, willing to participate        Exercises Total Joint Exercises Ankle Circles/Pumps: AROM, Both, 15 reps, Supine Quad Sets: AROM, Both, 10 reps, Supine Heel Slides: AAROM, Left, Supine, 20 reps Hip ABduction/ADduction: AAROM, Left, 15 reps, Supine    General Comments        Pertinent Vitals/Pain Pain Assessment Pain Assessment: 0-10 Pain Score: 6  Pain Location: L hip Pain Descriptors / Indicators:  Grimacing, Discomfort, Guarding, Burning Pain Intervention(s): Limited activity within patient's tolerance, Monitored during session, Premedicated before session, Ice applied    Home Living                          Prior Function            PT Goals (current goals can now be found in the care plan section) Acute Rehab PT Goals 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 Formulation: With patient Time For Goal Achievement: 12/16/23 Potential to Achieve Goals: Good Progress towards PT goals:  Progressing toward goals    Frequency    7X/week      PT Plan      Co-evaluation              AM-PAC PT 6 Clicks Mobility   Outcome Measure  Help needed turning from your back to your side while in a flat bed without using bedrails?: None Help needed moving from lying on your back to sitting on the side of a flat bed without using bedrails?: A Little Help needed moving to and from a bed to a chair (including a wheelchair)?: A Little Help needed standing up from a chair using your arms (e.g., wheelchair or bedside chair)?: A Little Help needed to walk in hospital room?: A Little Help needed climbing 3-5 steps with a railing? : A Little 6 Click Score: 19    End of Session Equipment Utilized During Treatment: Gait belt Activity Tolerance: Patient tolerated treatment well Patient left: in bed;with call bell/phone within reach;with bed alarm set Nurse Communication: Mobility status PT Visit Diagnosis: Unsteadiness on feet (R26.81);Other abnormalities of gait and mobility (R26.89);Muscle weakness (generalized) (M62.81);History of falling (Z91.81);Difficulty in walking, not elsewhere classified (R26.2);Pain Pain - Right/Left: Left Pain - part of body: Hip     Time: 1057-1130 PT Time Calculation (min) (ACUTE ONLY): 33 min  Charges:    $Gait Training: 8-22 mins $Therapeutic Exercise: 8-22 mins PT General Charges $$ ACUTE PT VISIT: 1 Visit                     [name] PT Acute Rehabilitation Services Pager [phone] Office [phone]    [name] 12/04/2023, 11:35 AM

## 2023-12-04 NOTE — Plan of Care (Signed)
 Patient discharging home via private vehicle. AVS and discharge instruction provided. Patient verbalizes understanding. Ara Knee, RN 12/04/23 2:14 PM

## 2023-12-04 NOTE — Progress Notes (Signed)
    Subjective: Patient reports pain as severe.  Tolerating diet.  Urinating.   No CP, SOB.  Has mobilized a small amount OOB with PT. Ready to go home  Objective:   VITALS:   Vitals:   12/03/23 1402 12/03/23 2058 12/03/23 2059 12/04/23 0518  BP: (!) 169/76 (!) 153/80 (!) 153/80 (!) 147/71  Pulse: 61 83 83 71  Resp: 18 18  17   Temp: 97.8 F (36.6 C) 99 F (37.2 C)  98.2 F (36.8 C)  TempSrc:  Oral  Oral  SpO2: 99% 98%  98%  Weight:      Height:          Latest Ref Rng & Units 11/19/2023    1:46 PM 10/28/2023   10:13 AM 06/20/2022   10:38 AM  CBC  WBC 4.0 - 10.5 K/uL 10.1  7.9  8.0   Hemoglobin 13.0 - 17.0 g/dL 85.7  85.3  81.6   Hematocrit 39.0 - 52.0 % 41.6  42.2  52.7   Platelets 150 - 400 K/uL 189  273  233       Latest Ref Rng & Units 11/19/2023    1:46 PM 10/28/2023   10:13 AM 06/20/2022   10:38 AM  BMP  Glucose 70 - 99 mg/dL 873  893  866   BUN 8 - 23 mg/dL 9  5  12    Creatinine 0.61 - 1.24 mg/dL 9.32  9.23  9.10   BUN/Creat Ratio 10 - 24  7  13    Sodium 135 - 145 mmol/L 133  137  137   Potassium 3.5 - 5.1 mmol/L 3.6  3.6  4.9   Chloride 98 - 111 mmol/L 103  99  99   CO2 22 - 32 mmol/L 22  22  22    Calcium  8.9 - 10.3 mg/dL 9.1  9.6  89.4    Intake/Output      02/05 0701 02/06 0700 02/06 0701 02/07 0700   P.O. 600    I.V. (mL/kg)     IV Piggyback     Total Intake(mL/kg) 600 (8.5)    Urine (mL/kg/hr) 900 (0.5)    Blood     Total Output 900    Net -300         Urine Occurrence 3 x        Assessment: 2 Days Post-Op  S/P Procedure(s) (LRB): TOTAL HIP ARTHROPLASTY ANTERIOR APPROACH (Left) by Dr. Evalene BIRCH. Beverley on 12/02/23  Principal Problem:   S/P total left hip arthroplasty   Plan:  Advance diet Up with therapy Incentive Spirometry Elevate and Apply ice  Weightbearing: WBAT LLE Insicional and dressing care: Dressings left intact until follow-up and Reinforce dressings as needed Orthopedic device(s): None Showering: Keep dressing  dry VTE prophylaxis:  Xarelto  10mg  daily x 30 days  , SCDs, ambulation Pain control: PRN Follow - up plan: 2 weeks Contact information:  Evalene Beverley MD, Gerard Large PA-C  Dispo: [address] [name], [place] Office [phone] 12/04/2023, 7:57 AM

## 2024-03-15 ENCOUNTER — Encounter (HOSPITAL_BASED_OUTPATIENT_CLINIC_OR_DEPARTMENT_OTHER): Payer: Self-pay | Admitting: Emergency Medicine

## 2024-03-15 ENCOUNTER — Emergency Department (HOSPITAL_BASED_OUTPATIENT_CLINIC_OR_DEPARTMENT_OTHER)
Admission: EM | Admit: 2024-03-15 | Discharge: 2024-03-15 | Disposition: A | Discharge status: Home / Self Care | Attending: Emergency Medicine | Admitting: Emergency Medicine

## 2024-03-15 ENCOUNTER — Other Ambulatory Visit: Payer: Self-pay

## 2024-03-15 ENCOUNTER — Emergency Department (HOSPITAL_BASED_OUTPATIENT_CLINIC_OR_DEPARTMENT_OTHER): Admitting: Radiology

## 2024-03-15 ENCOUNTER — Emergency Department (HOSPITAL_BASED_OUTPATIENT_CLINIC_OR_DEPARTMENT_OTHER)

## 2024-03-15 DIAGNOSIS — S0990XA Unspecified injury of head, initial encounter: Secondary | ICD-10-CM | POA: Diagnosis not present

## 2024-03-15 DIAGNOSIS — Y92410 Unspecified street and highway as the place of occurrence of the external cause: Secondary | ICD-10-CM | POA: Diagnosis not present

## 2024-03-15 DIAGNOSIS — E119 Type 2 diabetes mellitus without complications: Secondary | ICD-10-CM | POA: Insufficient documentation

## 2024-03-15 DIAGNOSIS — M25552 Pain in left hip: Secondary | ICD-10-CM | POA: Insufficient documentation

## 2024-03-15 DIAGNOSIS — M25561 Pain in right knee: Secondary | ICD-10-CM | POA: Diagnosis not present

## 2024-03-15 DIAGNOSIS — I110 Hypertensive heart disease with heart failure: Secondary | ICD-10-CM | POA: Diagnosis not present

## 2024-03-15 DIAGNOSIS — Z96643 Presence of artificial hip joint, bilateral: Secondary | ICD-10-CM | POA: Insufficient documentation

## 2024-03-15 DIAGNOSIS — M791 Myalgia, unspecified site: Secondary | ICD-10-CM | POA: Diagnosis not present

## 2024-03-15 DIAGNOSIS — M7918 Myalgia, other site: Secondary | ICD-10-CM

## 2024-03-15 DIAGNOSIS — M545 Low back pain, unspecified: Secondary | ICD-10-CM | POA: Diagnosis present

## 2024-03-15 DIAGNOSIS — I509 Heart failure, unspecified: Secondary | ICD-10-CM | POA: Insufficient documentation

## 2024-03-15 DIAGNOSIS — Z7901 Long term (current) use of anticoagulants: Secondary | ICD-10-CM | POA: Diagnosis not present

## 2024-03-15 MED ORDER — CYCLOBENZAPRINE HCL 10 MG PO TABS: 5.0000 mg | ORAL_TABLET | Freq: Every evening | ORAL | 0 refills | Status: DC | PRN

## 2024-03-15 MED ORDER — LIDOCAINE 5 % EX PTCH: 1.0000 | MEDICATED_PATCH | CUTANEOUS | 0 refills | Status: DC
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
## 2024-03-15 NOTE — ED Notes (Signed)
 Reviewed AVS/discharge instruction with patient. Time allotted for and all questions answered. Patient is agreeable for d/c and escorted to ed exit by staff.
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
## 2024-03-17 ENCOUNTER — Telehealth: Payer: Self-pay

## 2024-03-17 NOTE — Transitions of Care (Post Inpatient/ED Visit) (Unsigned)
 03/17/2024  Name: [name] MRN: [id number] DOB: 05/26/1956  Today's TOC FU Call Status:   Patient's Name and Date of Birth confirmed.  Transition Care Management Follow-up Telephone Call Date of Discharge: 03/15/24 Discharge Facility: [facility] ([facility]) Type of Discharge: Emergency Department How have you been since you were released from the hospital?: Same Any questions or concerns?: No  Items Reviewed: Did you receive and understand the discharge instructions provided?: No Medications obtained,verified, and reconciled?: Yes (Medications Reviewed) Any new allergies since your discharge?: No Dietary orders reviewed?: No Do you have support at home?: Yes People in Home [RPT]: child(ren), adult, sibling(s), other relative(s)  Medications Reviewed Today: Medications Reviewed Today     Reviewed by Angelita Bares, CMA (Certified Medical Assistant) on 03/17/24 at 1036  Med List Status: <None>   Medication Order Taking? Sig Documenting Provider Last Dose Status Informant  acetaminophen  (TYLENOL ) 500 MG tablet [id number] Yes Take 2 tablets (1,000 mg total) by mouth every 6 (six) hours as needed for mild pain (pain score 1-3) or moderate pain (pain score 4-6). Gawne, Meghan M, PA-C Taking Active   amLODipine  (NORVASC ) 10 MG tablet [id number] Yes Take 10 mg by mouth daily. [provider] Taking Active   atorvastatin  (LIPITOR) 40 MG tablet [id number]  Take 1 tablet (40 mg total) by mouth daily.  Patient taking differently: Take 20 mg by mouth at bedtime.   Jerrlyn Morel, NP  Expired 12/02/23 2359 Self, Pharmacy Records  blood glucose meter kit and supplies KIT [id number]  Dispense based on patient and insurance preference. Use up to four times daily as directed.  Patient not taking: Reported on 10/28/2023   Gregoria Leas, NP  Active Self, Pharmacy Records  clopidogrel  (PLAVIX ) 75 MG tablet [id number] Yes Take 75 mg by mouth daily. [provider]  Taking Active Self, Pharmacy Records  cyclobenzaprine  (FLEXERIL ) 10 MG tablet [id number] Yes Take 0.5-1 tablets (5-10 mg total) by mouth at bedtime as needed for muscle spasms. Rexie Catena, PA-C Taking Active   lidocaine  (LIDODERM ) 5 % [id number] Yes Place 1 patch onto the skin daily. Apply 1 patch to area of pain for up to 12 hours.  Then remove patch for full 12 hours before reapplying a new patch. Rexie Catena, PA-C Taking Active   metFORMIN  (GLUCOPHAGE ) 500 MG tablet [id number]  Take 1 tablet (500 mg total) by mouth daily with breakfast. Jerrlyn Morel, NP  Expired 11/19/23 2359 Self, Pharmacy Records  methocarbamol  (ROBAXIN -750) 750 MG tablet [id number] Yes Take 1 tablet (750 mg total) by mouth every 8 (eight) hours as needed for muscle spasms. Gawne, Meghan M, PA-C Taking Active   metoprolol  tartrate (LOPRESSOR ) 50 MG tablet [id number]  Take 1 tablet (50 mg total) by mouth 2 (two) times daily. Take one two times a day for blood pressure.  Patient taking differently: Take 25 mg by mouth 2 (two) times daily. Take one two times a day for blood pressure.   Jerrlyn Morel, NP  Expired 12/02/23 2359 Self, Pharmacy Records  ondansetron  (ZOFRAN -ODT) 4 MG disintegrating tablet [id number] Yes Take 1 tablet (4 mg total) by mouth every 8 (eight) hours as needed for nausea or vomiting. Gawne, Meghan M, PA-C Taking Active   oxyCODONE  (ROXICODONE ) 5 MG immediate release tablet [id number] Yes Take 1 tablet (5 mg total) by mouth every 4 (four) hours as needed for severe pain (pain score 7-10). after surgery that is not resolved by your normal daily dose of Oxycodone  for chronic pain  Gawne, Meghan M, PA-C Taking Active   rivaroxaban  (XARELTO ) 10 MG TABS tablet [id number] Yes Take 1 tablet (10 mg total) by mouth daily. For 30 days to prevent blood clots after surgery Marzella Solan M, PA-C Taking Active   sennosides-docusate sodium  (SENOKOT-S) 8.6-50 MG tablet [id number] Yes Take 2 tablets by mouth daily as  needed for constipation (while taking narcotics). Gawne, Meghan M, PA-C Taking Active   tamsulosin  (FLOMAX ) 0.4 MG CAPS capsule [id number] Yes Take 2 capsules (0.8 mg total) by mouth daily.  Patient taking differently: Take 0.4 mg by mouth daily.   Jerrlyn Morel, NP Taking Active Self, Pharmacy Records  traZODone  (DESYREL ) 100 MG tablet [id number] Yes Take 1 tablet (100 mg total) by mouth at bedtime. Take one table by mouth at bedtime as needed for sleep. Paseda, Folashade R, FNP Taking Active Self, Pharmacy Records            Home Care and Equipment/Supplies: Were Home Health Services Ordered?: No Any new equipment or medical supplies ordered?: No  Functional Questionnaire: Do you need assistance with bathing/showering or dressing?: No Do you need assistance with meal preparation?: No Do you need assistance with eating?: No Do you have difficulty maintaining continence: No Do you need assistance with getting out of bed/getting out of a chair/moving?: No Do you have difficulty managing or taking your medications?: No  Follow up appointments reviewed: PCP Follow-up appointment confirmed?: Yes Date of PCP follow-up appointment?: 03/29/24 Follow-up Provider: [facility] Follow-up appointment confirmed?: NA Do you need transportation to your follow-up appointment?: No Do you understand care options if your condition(s) worsen?: Yes-patient verbalized understanding    SIGNATURE Jamontae Thwaites, RMA

## 2024-03-29 ENCOUNTER — Inpatient Hospital Stay: Payer: Self-pay | Admitting: Nurse Practitioner

## 2024-03-31 ENCOUNTER — Inpatient Hospital Stay: Payer: Self-pay | Admitting: Nurse Practitioner

## 2024-04-02 ENCOUNTER — Encounter: Payer: Self-pay | Admitting: Nurse Practitioner

## 2024-04-02 ENCOUNTER — Ambulatory Visit (INDEPENDENT_AMBULATORY_CARE_PROVIDER_SITE_OTHER): Admitting: Nurse Practitioner

## 2024-04-02 VITALS — BP 163/87 | HR 77 | Temp 98.1°F | Resp 16 | Ht 67.0 in | Wt 147.0 lb

## 2024-04-02 DIAGNOSIS — G4700 Insomnia, unspecified: Secondary | ICD-10-CM | POA: Diagnosis not present

## 2024-04-02 DIAGNOSIS — Z125 Encounter for screening for malignant neoplasm of prostate: Secondary | ICD-10-CM

## 2024-04-02 DIAGNOSIS — E118 Type 2 diabetes mellitus with unspecified complications: Secondary | ICD-10-CM

## 2024-04-02 DIAGNOSIS — F4310 Post-traumatic stress disorder, unspecified: Secondary | ICD-10-CM

## 2024-04-02 LAB — POCT GLYCOSYLATED HEMOGLOBIN (HGB A1C): Hemoglobin A1C: 6.3 % — AB (ref 4.0–5.6)

## 2024-04-02 MED ORDER — CETIRIZINE HCL 10 MG PO TABS: 10.0000 mg | ORAL_TABLET | Freq: Every day | ORAL | 11 refills | Status: DC

## 2024-04-02 MED ORDER — TRAZODONE HCL 100 MG PO TABS
100.0000 mg | ORAL_TABLET | Freq: Every day | ORAL | 3 refills | Status: DC
Start: 2024-04-02 — End: 2024-05-06

## 2024-04-02 MED ORDER — AMLODIPINE BESYLATE 10 MG PO TABS: 10.0000 mg | ORAL_TABLET | Freq: Every day | ORAL | 0 refills | Status: DC

## 2024-04-02 MED ORDER — CLOPIDOGREL BISULFATE 75 MG PO TABS: 75.0000 mg | ORAL_TABLET | Freq: Every day | ORAL | 0 refills | Status: DC

## 2024-04-02 MED ORDER — METOPROLOL TARTRATE 50 MG PO TABS: 50.0000 mg | ORAL_TABLET | Freq: Two times a day (BID) | ORAL | 3 refills | Status: DC

## 2024-04-02 MED ORDER — DAPAGLIFLOZIN PROPANEDIOL 10 MG PO TABS: 10.0000 mg | ORAL_TABLET | Freq: Every day | ORAL | 2 refills | Status: DC

## 2024-04-02 NOTE — Patient Instructions (Signed)
 1. Type 2 diabetes mellitus with complication, without long-term current use of insulin  (HCC) (Primary)  - POCT glycosylated hemoglobin (Hb A1C) - CBC - Comprehensive metabolic panel with GFR - CBC - Comprehensive metabolic panel with GFR - Hemoglobin A1c - dapagliflozin propanediol (FARXIGA) 10 MG TABS tablet; Take 1 tablet (10 mg total) by mouth daily before breakfast.  Dispense: 30 tablet; Refill: 2 - Microalbumin/Creatinine Ratio, Urine  2. PTSD (post-traumatic stress disorder)  - Ambulatory referral to Psychiatry  3. Insomnia, unspecified type  - traZODone  (DESYREL ) 100 MG tablet; Take 1 tablet (100 mg total) by mouth at bedtime. Take one table by mouth at bedtime as needed for sleep.  Dispense: 90 tablet; Refill: 3  4. Prostate cancer screening  - PSA
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
## 2024-04-03 LAB — MICROALBUMIN / CREATININE URINE RATIO
Creatinine, Urine: 44.9 mg/dL
Microalb/Creat Ratio: 571 mg/g{creat} — ABNORMAL HIGH (ref 0–29)
Microalbumin, Urine: 256.2 ug/mL

## 2024-04-04 LAB — CBC
Hematocrit: 42.2 % (ref 37.5–51.0)
Hemoglobin: 14.4 g/dL (ref 13.0–17.7)
MCH: 33 pg (ref 26.6–33.0)
MCHC: 34.1 g/dL (ref 31.5–35.7)
MCV: 97 fL (ref 79–97)
Platelets: 218 10*3/uL (ref 150–450)
RBC: 4.36 x10E6/uL (ref 4.14–5.80)
RDW: 13 % (ref 11.6–15.4)
WBC: 7.5 10*3/uL (ref 3.4–10.8)

## 2024-04-04 LAB — COMPREHENSIVE METABOLIC PANEL WITH GFR
ALT: 21 IU/L (ref 0–44)
AST: 32 IU/L (ref 0–40)
Albumin: 4.3 g/dL (ref 3.9–4.9)
Alkaline Phosphatase: 80 IU/L (ref 44–121)
BUN/Creatinine Ratio: 8 — ABNORMAL LOW (ref 10–24)
BUN: 6 mg/dL — ABNORMAL LOW (ref 8–27)
Bilirubin Total: 0.5 mg/dL (ref 0.0–1.2)
CO2: 17 mmol/L — ABNORMAL LOW (ref 20–29)
Calcium: 9.5 mg/dL (ref 8.6–10.2)
Chloride: 103 mmol/L (ref 96–106)
Creatinine, Ser: 0.74 mg/dL — ABNORMAL LOW (ref 0.76–1.27)
Globulin, Total: 2.8 g/dL (ref 1.5–4.5)
Glucose: 118 mg/dL — ABNORMAL HIGH (ref 70–99)
Potassium: 4.1 mmol/L (ref 3.5–5.2)
Sodium: 139 mmol/L (ref 134–144)
Total Protein: 7.1 g/dL (ref 6.0–8.5)
eGFR: 99 mL/min/{1.73_m2} (ref >59)

## 2024-04-04 LAB — PSA: Prostate Specific Ag, Serum: 3.1 ng/mL (ref 0.0–4.0)

## 2024-04-04 LAB — HEMOGLOBIN A1C
Est. average glucose Bld gHb Est-mCnc: 128 mg/dL
Hgb A1c MFr Bld: 6.1 % — ABNORMAL HIGH (ref 4.8–5.6)

## 2024-04-12 ENCOUNTER — Other Ambulatory Visit: Payer: Self-pay | Admitting: Nurse Practitioner

## 2024-04-12 ENCOUNTER — Ambulatory Visit: Payer: Self-pay | Admitting: Nurse Practitioner

## 2024-04-12 DIAGNOSIS — N289 Disorder of kidney and ureter, unspecified: Secondary | ICD-10-CM

## 2024-04-16 ENCOUNTER — Telehealth: Payer: Self-pay | Admitting: Nurse Practitioner

## 2024-04-16 ENCOUNTER — Telehealth: Payer: Self-pay | Admitting: Emergency Medicine

## 2024-04-16 NOTE — Telephone Encounter (Signed)
 Copied from CRM [id number]. Topic: Clinical - Red Word Triage >> Apr 16, 2024 11:17 AM Antwanette L wrote: Red Word that prompted transfer to Nurse Triage: anxiety attack. Patient cannot sleep at night. Refused nurse triage  Please advise

## 2024-04-16 NOTE — Telephone Encounter (Signed)
 Copied from CRM [id number]. Topic: Appointments - Scheduling Inquiry for Clinic >> Apr 16, 2024 11:11 AM Antwanette L wrote: Reason for CRM: Patient  is calling because someone from the office was suppose to reach out to him about setting up an psychiatrist appt. Please contact patient at [phone]

## 2024-04-16 NOTE — Telephone Encounter (Signed)
 I called and spoke with patient made him aware that a referral was put in on 04/02/2024 so look out for them to call.

## 2024-04-19 ENCOUNTER — Other Ambulatory Visit: Payer: Self-pay

## 2024-04-19 ENCOUNTER — Emergency Department (HOSPITAL_COMMUNITY)

## 2024-04-19 ENCOUNTER — Encounter (HOSPITAL_COMMUNITY): Payer: Self-pay | Admitting: *Deleted

## 2024-04-19 ENCOUNTER — Other Ambulatory Visit: Payer: Self-pay | Admitting: Nurse Practitioner

## 2024-04-19 ENCOUNTER — Telehealth: Payer: Self-pay

## 2024-04-19 ENCOUNTER — Encounter: Payer: Self-pay | Admitting: *Deleted

## 2024-04-19 ENCOUNTER — Inpatient Hospital Stay (HOSPITAL_COMMUNITY)
Admission: EM | Admit: 2024-04-19 | Discharge: 2024-05-06 | DRG: 438 | Disposition: A | Discharge status: Skilled Nursing Facility | Source: Ambulatory Visit | Attending: Family Medicine | Admitting: Family Medicine

## 2024-04-19 ENCOUNTER — Ambulatory Visit
Admission: EM | Admit: 2024-04-19 | Discharge: 2024-04-19 | Disposition: A | Discharge status: Home / Self Care | Attending: Family Medicine | Admitting: Family Medicine

## 2024-04-19 DIAGNOSIS — G9349 Other encephalopathy: Secondary | ICD-10-CM | POA: Diagnosis not present

## 2024-04-19 DIAGNOSIS — K760 Fatty (change of) liver, not elsewhere classified: Secondary | ICD-10-CM | POA: Diagnosis present

## 2024-04-19 DIAGNOSIS — A0472 Enterocolitis due to Clostridium difficile, not specified as recurrent: Secondary | ICD-10-CM | POA: Diagnosis not present

## 2024-04-19 DIAGNOSIS — E1165 Type 2 diabetes mellitus with hyperglycemia: Secondary | ICD-10-CM | POA: Diagnosis present

## 2024-04-19 DIAGNOSIS — E872 Acidosis, unspecified: Secondary | ICD-10-CM | POA: Diagnosis present

## 2024-04-19 DIAGNOSIS — K852 Alcohol induced acute pancreatitis without necrosis or infection: Secondary | ICD-10-CM | POA: Diagnosis present

## 2024-04-19 DIAGNOSIS — I5032 Chronic diastolic (congestive) heart failure: Secondary | ICD-10-CM | POA: Diagnosis present

## 2024-04-19 DIAGNOSIS — F1010 Alcohol abuse, uncomplicated: Secondary | ICD-10-CM

## 2024-04-19 DIAGNOSIS — J189 Pneumonia, unspecified organism: Secondary | ICD-10-CM | POA: Diagnosis not present

## 2024-04-19 DIAGNOSIS — R112 Nausea with vomiting, unspecified: Secondary | ICD-10-CM | POA: Diagnosis not present

## 2024-04-19 DIAGNOSIS — R109 Unspecified abdominal pain: Principal | ICD-10-CM

## 2024-04-19 DIAGNOSIS — I700 Atherosclerosis of aorta: Secondary | ICD-10-CM | POA: Diagnosis present

## 2024-04-19 DIAGNOSIS — E440 Moderate protein-calorie malnutrition: Secondary | ICD-10-CM | POA: Diagnosis present

## 2024-04-19 DIAGNOSIS — K5730 Diverticulosis of large intestine without perforation or abscess without bleeding: Secondary | ICD-10-CM | POA: Diagnosis present

## 2024-04-19 DIAGNOSIS — R739 Hyperglycemia, unspecified: Secondary | ICD-10-CM

## 2024-04-19 DIAGNOSIS — K55059 Acute (reversible) ischemia of intestine, part and extent unspecified: Secondary | ICD-10-CM | POA: Diagnosis present

## 2024-04-19 DIAGNOSIS — Z79899 Other long term (current) drug therapy: Secondary | ICD-10-CM

## 2024-04-19 DIAGNOSIS — F17210 Nicotine dependence, cigarettes, uncomplicated: Secondary | ICD-10-CM | POA: Diagnosis present

## 2024-04-19 DIAGNOSIS — K7460 Unspecified cirrhosis of liver: Secondary | ICD-10-CM | POA: Diagnosis present

## 2024-04-19 DIAGNOSIS — K8591 Acute pancreatitis with uninfected necrosis, unspecified: Secondary | ICD-10-CM | POA: Diagnosis not present

## 2024-04-19 DIAGNOSIS — E785 Hyperlipidemia, unspecified: Secondary | ICD-10-CM | POA: Diagnosis present

## 2024-04-19 DIAGNOSIS — Z91199 Patient's noncompliance with other medical treatment and regimen due to unspecified reason: Secondary | ICD-10-CM

## 2024-04-19 DIAGNOSIS — F10231 Alcohol dependence with withdrawal delirium: Secondary | ICD-10-CM | POA: Diagnosis not present

## 2024-04-19 DIAGNOSIS — T383X6A Underdosing of insulin and oral hypoglycemic [antidiabetic] drugs, initial encounter: Secondary | ICD-10-CM | POA: Diagnosis present

## 2024-04-19 DIAGNOSIS — J9811 Atelectasis: Secondary | ICD-10-CM | POA: Diagnosis not present

## 2024-04-19 DIAGNOSIS — Z681 Body mass index (BMI) 19 or less, adult: Secondary | ICD-10-CM

## 2024-04-19 DIAGNOSIS — Z794 Long term (current) use of insulin: Secondary | ICD-10-CM

## 2024-04-19 DIAGNOSIS — E871 Hypo-osmolality and hyponatremia: Secondary | ICD-10-CM | POA: Diagnosis present

## 2024-04-19 DIAGNOSIS — N179 Acute kidney failure, unspecified: Secondary | ICD-10-CM | POA: Diagnosis present

## 2024-04-19 DIAGNOSIS — Z7984 Long term (current) use of oral hypoglycemic drugs: Secondary | ICD-10-CM

## 2024-04-19 DIAGNOSIS — F10931 Alcohol use, unspecified with withdrawal delirium: Secondary | ICD-10-CM | POA: Diagnosis not present

## 2024-04-19 DIAGNOSIS — J690 Pneumonitis due to inhalation of food and vomit: Secondary | ICD-10-CM | POA: Diagnosis not present

## 2024-04-19 DIAGNOSIS — A419 Sepsis, unspecified organism: Secondary | ICD-10-CM | POA: Diagnosis not present

## 2024-04-19 DIAGNOSIS — J9601 Acute respiratory failure with hypoxia: Secondary | ICD-10-CM | POA: Diagnosis not present

## 2024-04-19 DIAGNOSIS — F10131 Alcohol abuse with withdrawal delirium: Secondary | ICD-10-CM | POA: Diagnosis not present

## 2024-04-19 DIAGNOSIS — Z91148 Patient's other noncompliance with medication regimen for other reason: Secondary | ICD-10-CM

## 2024-04-19 DIAGNOSIS — Z96643 Presence of artificial hip joint, bilateral: Secondary | ICD-10-CM | POA: Diagnosis present

## 2024-04-19 DIAGNOSIS — R042 Hemoptysis: Secondary | ICD-10-CM | POA: Diagnosis not present

## 2024-04-19 DIAGNOSIS — D649 Anemia, unspecified: Secondary | ICD-10-CM | POA: Diagnosis not present

## 2024-04-19 DIAGNOSIS — I110 Hypertensive heart disease with heart failure: Secondary | ICD-10-CM | POA: Diagnosis present

## 2024-04-19 DIAGNOSIS — R000 Tachycardia, unspecified: Secondary | ICD-10-CM

## 2024-04-19 DIAGNOSIS — M069 Rheumatoid arthritis, unspecified: Secondary | ICD-10-CM | POA: Diagnosis present

## 2024-04-19 DIAGNOSIS — Z8673 Personal history of transient ischemic attack (TIA), and cerebral infarction without residual deficits: Secondary | ICD-10-CM

## 2024-04-19 DIAGNOSIS — I81 Portal vein thrombosis: Secondary | ICD-10-CM | POA: Diagnosis present

## 2024-04-19 DIAGNOSIS — D696 Thrombocytopenia, unspecified: Secondary | ICD-10-CM | POA: Diagnosis not present

## 2024-04-19 DIAGNOSIS — K8521 Alcohol induced acute pancreatitis with uninfected necrosis: Secondary | ICD-10-CM | POA: Diagnosis not present

## 2024-04-19 DIAGNOSIS — Z7901 Long term (current) use of anticoagulants: Secondary | ICD-10-CM

## 2024-04-19 DIAGNOSIS — E8729 Other acidosis: Secondary | ICD-10-CM

## 2024-04-19 DIAGNOSIS — E876 Hypokalemia: Secondary | ICD-10-CM | POA: Diagnosis present

## 2024-04-19 DIAGNOSIS — T447X6A Underdosing of beta-adrenoreceptor antagonists, initial encounter: Secondary | ICD-10-CM | POA: Diagnosis present

## 2024-04-19 DIAGNOSIS — Z8249 Family history of ischemic heart disease and other diseases of the circulatory system: Secondary | ICD-10-CM

## 2024-04-19 DIAGNOSIS — R39198 Other difficulties with micturition: Secondary | ICD-10-CM

## 2024-04-19 DIAGNOSIS — K219 Gastro-esophageal reflux disease without esophagitis: Secondary | ICD-10-CM | POA: Diagnosis present

## 2024-04-19 DIAGNOSIS — T45526A Underdosing of antithrombotic drugs, initial encounter: Secondary | ICD-10-CM | POA: Diagnosis present

## 2024-04-19 DIAGNOSIS — D6489 Other specified anemias: Secondary | ICD-10-CM | POA: Diagnosis not present

## 2024-04-19 LAB — CBG MONITORING, ED
Glucose-Capillary: 443 mg/dL — ABNORMAL HIGH (ref 70–99)
Glucose-Capillary: 354 mg/dL — ABNORMAL HIGH (ref 70–99)

## 2024-04-19 LAB — COMPREHENSIVE METABOLIC PANEL WITH GFR
ALT: 46 U/L — ABNORMAL HIGH (ref 0–44)
AST: 75 U/L — ABNORMAL HIGH (ref 15–41)
Albumin: 2.5 g/dL — ABNORMAL LOW (ref 3.5–5.0)
Alkaline Phosphatase: 53 U/L (ref 38–126)
Anion gap: 17 — ABNORMAL HIGH (ref 5–15)
BUN: 13 mg/dL (ref 8–23)
CO2: 15 mmol/L — ABNORMAL LOW (ref 22–32)
Calcium: 7.7 mg/dL — ABNORMAL LOW (ref 8.9–10.3)
Chloride: 93 mmol/L — ABNORMAL LOW (ref 98–111)
Creatinine, Ser: 1.48 mg/dL — ABNORMAL HIGH (ref 0.61–1.24)
GFR, Estimated: 52 mL/min — ABNORMAL LOW (ref >60)
Glucose, Bld: 348 mg/dL — ABNORMAL HIGH (ref 70–99)
Potassium: 3.1 mmol/L — ABNORMAL LOW (ref 3.5–5.1)
Sodium: 125 mmol/L — ABNORMAL LOW (ref 135–145)
Total Bilirubin: 2 mg/dL — ABNORMAL HIGH (ref 0.0–1.2)
Total Protein: 5.2 g/dL — ABNORMAL LOW (ref 6.5–8.1)

## 2024-04-19 LAB — URINALYSIS, W/ REFLEX TO CULTURE (INFECTION SUSPECTED)
Bacteria, UA: NONE SEEN
Bilirubin Urine: NEGATIVE
Glucose, UA: >=500 mg/dL — AB
Ketones, ur: NEGATIVE mg/dL
Nitrite: NEGATIVE
Protein, ur: 100 mg/dL — AB
Specific Gravity, Urine: 1.024 (ref 1.005–1.030)
pH: 6 (ref 5.0–8.0)

## 2024-04-19 LAB — I-STAT CHEM 8, ED
BUN: 18 mg/dL (ref 8–23)
Calcium, Ion: 0.96 mmol/L — ABNORMAL LOW (ref 1.15–1.40)
Chloride: 99 mmol/L (ref 98–111)
Creatinine, Ser: 1.5 mg/dL — ABNORMAL HIGH (ref 0.61–1.24)
Glucose, Bld: 377 mg/dL — ABNORMAL HIGH (ref 70–99)
HCT: 51 % (ref 39.0–52.0)
Hemoglobin: 17.3 g/dL — ABNORMAL HIGH (ref 13.0–17.0)
Potassium: 6.2 mmol/L — ABNORMAL HIGH (ref 3.5–5.1)
Sodium: 127 mmol/L — ABNORMAL LOW (ref 135–145)
TCO2: 17 mmol/L — ABNORMAL LOW (ref 22–32)

## 2024-04-19 LAB — I-STAT CG4 LACTIC ACID, ED
Lactic Acid, Venous: 6.9 mmol/L (ref 0.5–1.9)
Lactic Acid, Venous: 7.6 mmol/L (ref 0.5–1.9)

## 2024-04-19 LAB — CBC
HCT: 56.6 % — ABNORMAL HIGH (ref 39.0–52.0)
Hemoglobin: 20.6 g/dL — ABNORMAL HIGH (ref 13.0–17.0)
MCH: 33.3 pg (ref 26.0–34.0)
MCHC: 36.4 g/dL — ABNORMAL HIGH (ref 30.0–36.0)
MCV: 91.4 fL (ref 80.0–100.0)
Platelets: 218 10*3/uL (ref 150–400)
RBC: 6.19 MIL/uL — ABNORMAL HIGH (ref 4.22–5.81)
RDW: 13.1 % (ref 11.5–15.5)
WBC: 22.6 10*3/uL — ABNORMAL HIGH (ref 4.0–10.5)
nRBC: 0 % (ref 0.0–0.2)

## 2024-04-19 LAB — GLUCOSE, CAPILLARY
Glucose-Capillary: 203 mg/dL — ABNORMAL HIGH (ref 70–99)
Glucose-Capillary: 228 mg/dL — ABNORMAL HIGH (ref 70–99)
Glucose-Capillary: 301 mg/dL — ABNORMAL HIGH (ref 70–99)
Glucose-Capillary: 338 mg/dL — ABNORMAL HIGH (ref 70–99)
Glucose-Capillary: 338 mg/dL — ABNORMAL HIGH (ref 70–99)
Glucose-Capillary: 375 mg/dL — ABNORMAL HIGH (ref 70–99)

## 2024-04-19 LAB — MRSA NEXT GEN BY PCR, NASAL: MRSA by PCR Next Gen: NOT DETECTED

## 2024-04-19 LAB — RESP PANEL BY RT-PCR (RSV, FLU A&B, COVID)  RVPGX2
Influenza A by PCR: NEGATIVE
Influenza B by PCR: NEGATIVE
Resp Syncytial Virus by PCR: NEGATIVE
SARS Coronavirus 2 by RT PCR: NEGATIVE

## 2024-04-19 LAB — PROTIME-INR
INR: 1.1 (ref 0.8–1.2)
Prothrombin Time: 14 s (ref 11.4–15.2)

## 2024-04-19 LAB — LACTIC ACID, PLASMA: Lactic Acid, Venous: 3.9 mmol/L (ref 0.5–1.9)

## 2024-04-19 LAB — LIPASE, BLOOD: Lipase: 298 U/L — ABNORMAL HIGH (ref 11–51)

## 2024-04-19 LAB — LACTATE DEHYDROGENASE: LDH: 273 U/L — ABNORMAL HIGH (ref 98–192)

## 2024-04-19 LAB — BLOOD GAS, VENOUS
Acid-base deficit: 9.9 mmol/L — ABNORMAL HIGH (ref 0.0–2.0)
Bicarbonate: 15.3 mmol/L — ABNORMAL LOW (ref 20.0–28.0)
O2 Saturation: 84.9 %
Patient temperature: 37
pCO2, Ven: 31 mmHg — ABNORMAL LOW (ref 44–60)
pH, Ven: 7.3 (ref 7.25–7.43)
pO2, Ven: 52 mmHg — ABNORMAL HIGH (ref 32–45)

## 2024-04-19 LAB — POCT FASTING CBG KUC MANUAL ENTRY: POCT Glucose (KUC): 405 mg/dL — AB (ref 70–99)

## 2024-04-19 LAB — APTT: aPTT: >200 s (ref 24–36)

## 2024-04-19 LAB — HIV ANTIBODY (ROUTINE TESTING W REFLEX): HIV Screen 4th Generation wRfx: NONREACTIVE

## 2024-04-19 MED ORDER — HEPARIN BOLUS VIA INFUSION
4000.0000 [IU] | Freq: Once | INTRAVENOUS | Status: AC
  Administered 2024-04-19: 4000 [IU] via INTRAVENOUS
  Filled 2024-04-19: qty 4000

## 2024-04-19 MED ORDER — LACTATED RINGERS IV SOLN: INTRAVENOUS | Status: AC

## 2024-04-19 MED ORDER — LACTATED RINGERS IV BOLUS (SEPSIS)
1000.0000 mL | Freq: Once | INTRAVENOUS | Status: AC
  Administered 2024-04-19: 1000 mL via INTRAVENOUS

## 2024-04-19 MED ORDER — LABETALOL HCL 5 MG/ML IV SOLN
10.0000 mg | Freq: Once | INTRAVENOUS | Status: AC
  Administered 2024-04-19: 10 mg via INTRAVENOUS
  Filled 2024-04-19: qty 4

## 2024-04-19 MED ORDER — ONDANSETRON HCL 4 MG/2ML IJ SOLN: 4.0000 mg | Freq: Four times a day (QID) | INTRAMUSCULAR | Status: DC | PRN

## 2024-04-19 MED ORDER — AMLODIPINE BESYLATE 5 MG PO TABS
10.0000 mg | ORAL_TABLET | Freq: Once | ORAL | Status: DC
  Filled 2024-04-19: qty 2

## 2024-04-19 MED ORDER — SODIUM CHLORIDE 0.9 % IV SOLN
2.0000 g | Freq: Once | INTRAVENOUS | Status: DC
  Filled 2024-04-19: qty 12.5

## 2024-04-19 MED ORDER — HEPARIN (PORCINE) 25000 UT/250ML-% IV SOLN
1350.0000 [IU]/h | INTRAVENOUS | Status: DC
  Administered 2024-04-19: 1100 [IU]/h via INTRAVENOUS
  Administered 2024-04-20 – 2024-04-21 (×2): 900 [IU]/h via INTRAVENOUS
  Administered 2024-04-22: 1300 [IU]/h via INTRAVENOUS
  Administered 2024-04-23: 1350 [IU]/h via INTRAVENOUS
  Filled 2024-04-19 (×5): qty 250

## 2024-04-19 MED ORDER — THIAMINE HCL 100 MG/ML IJ SOLN
100.0000 mg | Freq: Every day | INTRAMUSCULAR | Status: DC
  Administered 2024-04-20: 100 mg via INTRAVENOUS
  Filled 2024-04-19: qty 2

## 2024-04-19 MED ORDER — SODIUM CHLORIDE 0.9 % IV SOLN
1.0000 g | Freq: Three times a day (TID) | INTRAVENOUS | Status: DC
  Filled 2024-04-19 (×2): qty 20

## 2024-04-19 MED ORDER — METRONIDAZOLE 500 MG/100ML IV SOLN
500.0000 mg | Freq: Once | INTRAVENOUS | Status: AC
  Administered 2024-04-19: 500 mg via INTRAVENOUS

## 2024-04-19 MED ORDER — HYDROMORPHONE HCL 1 MG/ML IJ SOLN
1.0000 mg | Freq: Once | INTRAMUSCULAR | Status: AC
  Administered 2024-04-19: 1 mg via INTRAVENOUS
  Filled 2024-04-19: qty 1

## 2024-04-19 MED ORDER — POTASSIUM CHLORIDE 10 MEQ/100ML IV SOLN
10.0000 meq | INTRAVENOUS | Status: AC
  Administered 2024-04-19 (×4): 10 meq via INTRAVENOUS
  Filled 2024-04-19 (×4): qty 100

## 2024-04-19 MED ORDER — INSULIN ASPART 100 UNIT/ML IV SOLN
5.0000 [IU] | Freq: Once | INTRAVENOUS | Status: AC
  Administered 2024-04-19: 5 [IU] via INTRAVENOUS
  Filled 2024-04-19: qty 0.05

## 2024-04-19 MED ORDER — DOCUSATE SODIUM 100 MG PO CAPS: 100.0000 mg | ORAL_CAPSULE | Freq: Two times a day (BID) | ORAL | Status: DC | PRN

## 2024-04-19 MED ORDER — VANCOMYCIN HCL IN DEXTROSE 1-5 GM/200ML-% IV SOLN: 1000.0000 mg | Freq: Once | INTRAVENOUS | Status: DC

## 2024-04-19 MED ORDER — LORAZEPAM 2 MG/ML IJ SOLN
1.0000 mg | Freq: Four times a day (QID) | INTRAMUSCULAR | Status: DC
  Administered 2024-04-19 – 2024-04-21 (×4): 1 mg via INTRAVENOUS
  Filled 2024-04-19 (×3): qty 1

## 2024-04-19 MED ORDER — SODIUM CHLORIDE 0.9 % IV SOLN: 1.0000 g | Freq: Two times a day (BID) | INTRAVENOUS | Status: DC

## 2024-04-19 MED ORDER — LACTATED RINGERS IV SOLN: INTRAVENOUS | Status: DC

## 2024-04-19 MED ORDER — METRONIDAZOLE 500 MG/100ML IV SOLN
500.0000 mg | Freq: Once | INTRAVENOUS | Status: DC
  Filled 2024-04-19: qty 100

## 2024-04-19 MED ORDER — INSULIN REGULAR(HUMAN) IN NACL 100-0.9 UT/100ML-% IV SOLN
INTRAVENOUS | Status: AC
  Administered 2024-04-19: 4.4 [IU]/h via INTRAVENOUS
  Administered 2024-04-21: 0.7 [IU]/h via INTRAVENOUS
  Filled 2024-04-19 (×3): qty 100

## 2024-04-19 MED ORDER — HYDROMORPHONE HCL 1 MG/ML IJ SOLN
1.0000 mg | INTRAMUSCULAR | Status: DC | PRN
  Administered 2024-04-19 – 2024-04-30 (×64): 1 mg via INTRAVENOUS
  Filled 2024-04-19 (×66): qty 1

## 2024-04-19 MED ORDER — FOLIC ACID 5 MG/ML IJ SOLN
1.0000 mg | Freq: Every day | INTRAMUSCULAR | Status: DC
  Administered 2024-04-20 – 2024-05-04 (×15): 1 mg via INTRAVENOUS
  Filled 2024-04-19 (×18): qty 0.2

## 2024-04-19 MED ORDER — IOHEXOL 300 MG/ML  SOLN
100.0000 mL | Freq: Once | INTRAMUSCULAR | Status: AC | PRN
  Administered 2024-04-19: 100 mL via INTRAVENOUS

## 2024-04-19 MED ORDER — HEPARIN SODIUM (PORCINE) 5000 UNIT/ML IJ SOLN
5000.0000 [IU] | Freq: Three times a day (TID) | INTRAMUSCULAR | Status: DC
  Filled 2024-04-19: qty 1

## 2024-04-19 MED ORDER — CHLORHEXIDINE GLUCONATE CLOTH 2 % EX PADS
6.0000 | MEDICATED_PAD | Freq: Every day | CUTANEOUS | Status: DC
  Administered 2024-04-19 – 2024-05-03 (×13): 6 via TOPICAL

## 2024-04-19 MED ORDER — ONDANSETRON HCL 4 MG/2ML IJ SOLN
4.0000 mg | Freq: Once | INTRAMUSCULAR | Status: AC
  Administered 2024-04-19: 4 mg via INTRAVENOUS
  Filled 2024-04-19: qty 2

## 2024-04-19 MED ORDER — VANCOMYCIN HCL 1500 MG/300ML IV SOLN
1500.0000 mg | Freq: Once | INTRAVENOUS | Status: DC
  Filled 2024-04-19: qty 300

## 2024-04-19 MED ORDER — DEXTROSE 50 % IV SOLN: 0.0000 mL | INTRAVENOUS | Status: DC | PRN

## 2024-04-19 MED ORDER — LABETALOL HCL 5 MG/ML IV SOLN: 40.0000 mg | Freq: Once | INTRAVENOUS | Status: DC

## 2024-04-19 MED ORDER — SODIUM CHLORIDE 0.9 % IV SOLN: 1.0000 mg | Freq: Once | INTRAVENOUS | Status: DC

## 2024-04-19 MED ORDER — LABETALOL HCL 5 MG/ML IV SOLN
10.0000 mg | INTRAVENOUS | Status: DC | PRN
  Administered 2024-04-20 – 2024-04-25 (×8): 10 mg via INTRAVENOUS
  Filled 2024-04-19 (×8): qty 4

## 2024-04-19 MED ORDER — POLYETHYLENE GLYCOL 3350 17 G PO PACK: 17.0000 g | PACK | Freq: Every day | ORAL | Status: DC | PRN

## 2024-04-19 MED ORDER — SODIUM CHLORIDE 0.9 % IV SOLN
2.0000 g | Freq: Once | INTRAVENOUS | Status: AC
  Administered 2024-04-19: 2 g via INTRAVENOUS
  Filled 2024-04-19: qty 20

## 2024-04-19 MED ORDER — HYDRALAZINE HCL 20 MG/ML IJ SOLN
10.0000 mg | INTRAMUSCULAR | Status: DC | PRN
  Administered 2024-04-19 – 2024-04-24 (×14): 10 mg via INTRAVENOUS
  Filled 2024-04-19 (×15): qty 1

## 2024-04-19 NOTE — ED Triage Notes (Signed)
 BIB EMS from UC due to N/V and weakness. Pt Alert and oriented. Vomited x 1 today and 3 times yesterday. Requesting pain meds, not feeling good. 150/100-120-99% RA-34- CBG 344 ET 17 T- 96.9 #20 L AC, Zofran  4 mg given.

## 2024-04-19 NOTE — Consult Note (Signed)
 [name] Aug 24, 1956  [id number].    Requesting MD: Verdon Gore, MD Chief Complaint/Reason for Consult: acute pancreatitis with necrosis  HPI:  [name] is a 68 yo male with a history of EtOH use who presented to the ED with 3 days of abdominal pain. Labs were significant for anion gap metabolic acidosis, Tbili of 2.0, and lipase of 298. A CT scan showed significant peripancreatic stranding with developing necrosis, and an SMV thrombus. The patient is being admitted to the ICU for hydration, insulin  gtt, and monitoring for alcohol withdrawal. General surgery was consulted.   ROS: Review of Systems  Constitutional:  Positive for malaise/fatigue.  Gastrointestinal:  Positive for abdominal pain.    Family History  Problem Relation Age of Onset   Kidney disease Brother    Angioedema Maternal Aunt        Tongue swelling   Heart attack Mother    Heart attack Father     Past Medical History:  Diagnosis Date   Alcohol abuse    Allergic rhinitis    CHF (congestive heart failure) (HCC)    Diabetes mellitus without complication (HCC)    'sometime last yr'--type 2   GERD (gastroesophageal reflux disease)    Hepatitis    he thinks its hep b or c   Hyperlipidemia    Hypertension    Osteoarthritis    Pancreatitis    Rheumatoid arthritis(714.0)    Stroke (HCC)    Tobacco user     Past Surgical History:  Procedure Laterality Date   COLONOSCOPY     FRACTURE SURGERY     cheek bone fracture    [facility]     right shoulder  -- 2010   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   Procedure: TOTAL HIP ARTHROPLASTY ANTERIOR APPROACH;  Surgeon: [name], MD;  Location: [facility] OR;  Service: Orthopedics;  Laterality: Right;   TOTAL HIP ARTHROPLASTY Left 12/02/2023   Procedure: TOTAL HIP ARTHROPLASTY ANTERIOR APPROACH;  Surgeon: Chancy Evalene JONETTA, MD;  Location: [facility] ORS;  Service: Orthopedics;  Laterality: Left;   WRIST SURGERY     fusion with pins  2007    Social History:  reports  that he has been smoking cigarettes. He has a 10 pack-year smoking history. He has been exposed to tobacco smoke. He has never used smokeless tobacco. He reports current alcohol use of about 7.0 - 14.0 standard drinks of alcohol per week. He reports that he does not currently use drugs.  Allergies:  Allergies  Allergen Reactions   Lisinopril  Other (See Comments)    angioedema   Citalopram Nausea Only   Morphine  Nausea And Vomiting   Nsaids Other (See Comments)    Stomach Irritability    Paroxetine Nausea And Vomiting    (Not in a hospital admission)    Physical Exam: Blood pressure (!) 163/103, pulse 86, temperature (!) 96.3 F (35.7 C), temperature source Axillary, resp. rate (!) 23, height 5' 7 (1.702 m), weight 66.7 kg, SpO2 94%. General: resting comfortably, appears stated age, no apparent distress Neurological: mildly lethargic but rouses to verbal stimuli, answers questions appropriately HEENT: normocephalic, atraumatic CV: regular rate and rhythm Respiratory: normal work of breathing on room air Abdomen: soft, nondistended, diffusely tender to palpation, no rebound tenderness or guarding. Extremities: warm and well-perfused, no deformities, moving all extremities spontaneously Psychiatric: normal mood and affect Skin: warm and dry, no jaundice, no rashes or lesions    CT ABDOMEN PELVIS W CONTRAST Result Date: 04/19/2024 CLINICAL  DATA:  Abdominal pain. EXAM: CT ABDOMEN AND PELVIS WITH CONTRAST TECHNIQUE: Multidetector CT imaging of the abdomen and pelvis was performed using the standard protocol following bolus administration of intravenous contrast. RADIATION DOSE REDUCTION: This exam was performed according to the departmental dose-optimization program which includes automated exposure control, adjustment of the mA and/or kV according to patient size and/or use of iterative reconstruction technique. CONTRAST:  100mL OMNIPAQUE  IOHEXOL  300 MG/ML  SOLN COMPARISON:  None  Available. FINDINGS: Lower chest: Bibasilar subpleural atelectasis. No intra-abdominal free air.  Small ascites. Hepatobiliary: Fatty liver. There is irregularity of the liver contour suggestive of early cirrhosis. No biliary dilatation. The gallbladder is distended. No calcified gallstone. Pancreas: Severe inflammatory changes of the pancreas consistent with acute pancreatitis. Areas of non enhancement involving the uncinate process as well as body and tail of the pancreas suggest areas of necrosis. Significant amount of inflammatory fluid adjacent to the pancreas. No organized fluid collection. Spleen: Normal in size without focal abnormality. Adrenals/Urinary Tract: The adrenal glands unremarkable. Small bilateral renal cysts. There is no hydronephrosis on either side. The visualized ureters appear unremarkable. The urinary bladder is poorly visualized due to streak artifact caused by bilateral hip arthroplasties. Stomach/Bowel: There is diverticulosis of the proximal colon. There is no bowel obstruction or active inflammation. The appendix is normal. Vascular/Lymphatic: Mild aortoiliac atherosclerotic disease. The IVC is unremarkable. There is thrombus extending from the SMV into the porta splenic confluence and proximal main portal vein. There is high-grade narrowing of the SMV close to the porta splenic confluence. The main portal vein remains patent. No portal venous gas. No adenopathy. Reproductive: The prostate gland is not visualized due to streak artifact. Other: None Musculoskeletal: Degenerative changes of the spine. Bilateral total hip arthroplasties. No acute osseous pathology. IMPRESSION: 1. 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 No abscess or pseudocyst. 2. 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 3. Fatty liver with early cirrhosis. 4. Colonic diverticulosis. No bowel obstruction. Normal appendix. 5.  Aortic  Atherosclerosis (ICD10-I70.0). These results were called by telephone at the time of interpretation on 04/19/2024 at 3:36 pm to Joen Paris, PA, who verbally acknowledged these results. Electronically Signed   By: Vanetta Chou M.D.   On: 04/19/2024 15:48   DG Chest Port 1 View Result Date: 04/19/2024 CLINICAL DATA:  Questionable sepsis - evaluate for abnormality. Nausea, vomiting, weakness EXAM: PORTABLE CHEST 1 VIEW COMPARISON:  05/14/2021 FINDINGS: The heart size and mediastinal contours are within normal limits. Both lungs are clear. The visualized skeletal structures are unremarkable. IMPRESSION: No active disease. Electronically Signed   By: Franky Crease M.D.   On: 04/19/2024 14:58      Assessment/Plan 68 yo male with severe acute pancreatitis with necrosis, presumably from EtOH use. I personally reviewed his labs, imaging and notes. He has diffuse peripancreatic stranding with developing necrosis across the gland, but no walled-off collections, and no gas within the necrosis to suggest infection. The SMV and splenic vein are patent but there is a nonocclusive SMV thrombus. Patient is very early in the course of pancreatitis and there is no indication for acute surgical intervention. - Agree with anticoagulation for SMV thrombus - No indication for antibiotics in the absence of infected necrosis - Aggressive IV fluid hydration and supportive care - Patient will need nutritional support. Would have a low threshold to place a nasojejunal feeding tube. - Surgery will follow.   Leonor Dawn, MD [facility]  General, Hepatobiliary and Pancreatic Surgery 04/19/24 5:54 PM

## 2024-04-19 NOTE — ED Notes (Signed)
 I stat Lactic Acid given to MD and RN 7.56

## 2024-04-19 NOTE — Sepsis Progress Note (Signed)
 Sepsis protocol monitored by eLink ?

## 2024-04-19 NOTE — Discharge Instructions (Addendum)
 Patient will be transported by EMS to the emergency room for further evaluation and treatment

## 2024-04-19 NOTE — ED Notes (Signed)
.  ucPatient is being discharged from the Urgent Care and sent to the Emergency Department via EMS . Per Dr. Vonna, patient is in need of higher level of care due to vomiting, hyperglycemia, tachy. Patient is aware and verbalizes understanding of plan of care.  Vitals:   04/19/24 1149  BP: 120/84  Pulse: (!) 133  Resp: (!) 28  Temp: 98 F (36.7 C)  SpO2: 96%

## 2024-04-19 NOTE — H&P (Signed)
 NAME:  [name], MRN:  [id number], DOB:  02/17/1956, LOS: 0 ADMISSION DATE:  04/19/2024, CONSULTATION DATE:  6/23 REFERRING MD:  Dr. Dreama , CHIEF COMPLAINT:  Pancreatitis   History of Present Illness:  68 year old male with PMH as below, which is significant for alcohol abuse, RA, pancreatitis, CHF, and DM. He was admitted in February of this year for a total hip arthoplasty for osteoarthritis. Drinks 10 beers per day. The perioperative course was uncomplicated. He now is presenting to [facility] ED 6/23 with complaints of abdominal pain for 3 days. Pain was diffuse, but worse over the right abdominal quadrants. CT of the abdomen demonstrated pancreatitis with necrosis. Lipase 283. CT also noted thrombus extending from the SMV into the portal splenic confluence. He was given IV fluids and started on antibiotics. He was also hypertensive and tachycardic, which was treated effectively with labetalol. GI was consulted by the emergency department and PCCM was asked to admit for severe pancreatitis.   Pertinent  Medical History   has a past medical history of Alcohol abuse, Allergic rhinitis, CHF (congestive heart failure) (HCC), Diabetes mellitus without complication (HCC), GERD (gastroesophageal reflux disease), Hepatitis, Hyperlipidemia, Hypertension, Osteoarthritis, Pancreatitis, Rheumatoid arthritis(714.0), Stroke (HCC), and Tobacco user.   Significant Hospital Events: Including procedures, antibiotic start and stop dates in addition to other pertinent events   6/23 admit   Interim History / Subjective:    Objective    Blood pressure (!) 157/103, pulse 83, temperature (!) 96.3 F (35.7 C), temperature source Axillary, resp. rate (!) 26, height 5' 7 (1.702 m), weight 66.7 kg, SpO2 95%.       No intake or output data in the 24 hours ending 04/19/24 1733 Filed Weights   04/19/24 1301  Weight: 66.7 kg    Examination: General: Thin adult male in NAD HENT: [place]/AT, PERRL, no  JVD Lungs: Clear bilateral breath sounds. Shallow inspirations limited by abdominal pain Cardiovascular: RRR, no MRG Abdomen: Guarding, tender diffusely but more - so over the RUQ and RLQ. Pain with bed shaking.  Extremities: No acute deformity or edema.  Neuro: Alert, oriented, non-tender.  GU: Foley  Resolved problem list   Assessment and Plan   Acute necrotizing pancreatitis: Ranson score 3. History of alcoholic pancreatitis.  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  - heparin per pharmacy  Alcohol abuse High risk of withdrawal. Last drink 6/21. - CIWA protocol - Scheduled ativan  low dose - Thiamine , folate  Lactic acidosis: secondary to pancreatitis - repeat lactic acid  AKI Hyponatremia Hypokalemia - IVF - Trend chemistry - Replete K  DM2 with hyperglycemia - Insulin  infusion - CBG monitoring - Holding home farxiga   Chronic HFpEF HTN - PRN labetalol - HTN improved with pain management.  - Holding home amlodipine , lopressor   Rheumatoid arthritis - Doesn't appear as though he is on any home medications  Tobacco abuse - nicotine  patch  Best Practice (right click and Reselect all SmartList Selections daily)   Diet/type: NPO DVT prophylaxis systemic heparin Pressure ulcer(s): pressure ulcer assessment deferred  GI prophylaxis: PPI Lines: N/A Foley:  N/A Code Status:  full code Last date of multidisciplinary goals of care discussion [ ]   Labs   CBC: Recent Labs  Lab 04/19/24 1331 04/19/24 1420  WBC  22.6*  --   HGB 20.6* 17.3*  HCT 56.6* 51.0  MCV 91.4  --   PLT 218  --     Basic Metabolic Panel: Recent Labs  Lab 04/19/24 1420 04/19/24 1532  NA 127* 125*  K 6.2* 3.1*  CL 99 93*  CO2  --  15*  GLUCOSE 377* 348*  BUN 18 13  CREATININE  1.50* 1.48*  CALCIUM   --  7.7*   GFR: Estimated Creatinine Clearance: 45.3 mL/min (A) (by C-G formula based on SCr of 1.48 mg/dL (H)). Recent Labs  Lab 04/19/24 1331 04/19/24 1420 04/19/24 1654  WBC 22.6*  --   --   LATICACIDVEN  --  6.9* 7.6*    Liver Function Tests: Recent Labs  Lab 04/19/24 1532  AST 75*  ALT 46*  ALKPHOS 53  BILITOT 2.0*  PROT 5.2*  ALBUMIN  2.5*   Recent Labs  Lab 04/19/24 1532  LIPASE 298*   No results for input(s): AMMONIA in the last 168 hours.  ABG    Component Value Date/Time   HCO3 15.3 (L) 04/19/2024 1439   TCO2 17 (L) 04/19/2024 1420   ACIDBASEDEF 9.9 (H) 04/19/2024 1439   O2SAT 84.9 04/19/2024 1439     Coagulation Profile: Recent Labs  Lab 04/19/24 1331  INR 1.1    Cardiac Enzymes: No results for input(s): CKTOTAL, CKMB, CKMBINDEX, TROPONINI in the last 168 hours.  HbA1C: Hemoglobin A1C  Date/Time Value Ref Range Status  04/02/2024 10:32 AM 6.3 (A) 4.0 - 5.6 % Final  10/28/2023 10:30 AM 5.7 (A) 4.0 - 5.6 % Final   HbA1c, POC (prediabetic range)  Date/Time Value Ref Range Status  06/20/2022 08:40 AM 5.9 5.7 - 6.4 % Final  12/05/2021 11:00 AM 6.2 5.7 - 6.4 % Final   HbA1c, POC (controlled diabetic range)  Date/Time Value Ref Range Status  06/20/2022 08:40 AM 5.9 0.0 - 7.0 % Final  12/05/2021 11:00 AM 6.2 0.0 - 7.0 % Final   HbA1c POC (<> result, manual entry)  Date/Time Value Ref Range Status  06/20/2022 08:40 AM 5.9 4.0 - 5.6 % Final  12/05/2021 11:00 AM 6.2 4.0 - 5.6 % Final   Hgb A1c MFr Bld  Date/Time Value Ref Range Status  04/02/2024 11:00 AM 6.1 (H) 4.8 - 5.6 % Final    Comment:             Prediabetes: 5.7 - 6.4          Diabetes: >6.4          Glycemic control for adults with diabetes: <7.0     CBG: Recent Labs  Lab 04/19/24 1303 04/19/24 1652  GLUCAP 443* 354*    Review of Systems:   Bolds are positive  Constitutional: weight loss, gain, night sweats, Fevers, chills, fatigue .   HEENT: headaches, Sore throat, sneezing, nasal congestion, post nasal drip, Difficulty swallowing, Tooth/dental problems, visual complaints visual changes, ear ache CV:  chest pain, radiates:,Orthopnea, PND, swelling in lower extremities, dizziness, palpitations, syncope.  GI  heartburn, indigestion, abdominal pain, nausea, vomiting, diarrhea, change in bowel habits, loss of appetite, bloody stools.  Resp: cough, productive: , hemoptysis, dyspnea, chest pain, pleuritic.  Skin: rash or itching or icterus GU: dysuria, change in color of urine, urgency or frequency. flank pain, hematuria  MS: joint pain or swelling. decreased range of motion  Psych: change in mood or affect. depression or anxiety.  Neuro: difficulty with speech, weakness, numbness, ataxia    Past Medical History:  He,  has a past medical history of Alcohol abuse, Allergic rhinitis, CHF (congestive heart failure) (HCC), Diabetes mellitus without complication (HCC), GERD (gastroesophageal reflux disease), Hepatitis, Hyperlipidemia, Hypertension, Osteoarthritis, Pancreatitis, Rheumatoid arthritis(714.0), Stroke (HCC), and Tobacco user.   Surgical History:   Past Surgical History:  Procedure Laterality Date   COLONOSCOPY     FRACTURE SURGERY     cheek bone fracture    rtc     right shoulder  -- 2010   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   Procedure: TOTAL HIP ARTHROPLASTY ANTERIOR APPROACH;  Surgeon: [name], MD;  Location: [facility] OR;  Service: Orthopedics;  Laterality: Right;   TOTAL HIP ARTHROPLASTY Left 12/02/2023   Procedure: TOTAL HIP ARTHROPLASTY ANTERIOR APPROACH;  Surgeon: Chancy Evalene JONETTA, MD;  Location: [facility] ORS;  Service: Orthopedics;  Laterality: Left;   WRIST SURGERY     fusion with pins  2007     Social History:   reports that he has been smoking cigarettes. He has a 10 pack-year smoking history. He has been exposed to tobacco smoke. He has never used smokeless tobacco. He reports current alcohol use of  about 7.0 - 14.0 standard drinks of alcohol per week. He reports that he does not currently use drugs.   Family History:  His family history includes Angioedema in his maternal aunt; Heart attack in his father and mother; Kidney disease in his brother.   Allergies Allergies  Allergen Reactions   Lisinopril  Other (See Comments)    angioedema   Citalopram Nausea Only   Morphine  Nausea And Vomiting   Nsaids Other (See Comments)    Stomach Irritability    Paroxetine Nausea And Vomiting     Home Medications  Prior to Admission medications   Medication Sig Start Date End Date Taking? Authorizing Provider  acetaminophen  (TYLENOL ) 500 MG tablet Take 2 tablets (1,000 mg total) by mouth every 6 (six) hours as needed for mild pain (pain score 1-3) or moderate pain (pain score 4-6). 12/04/23   Gawne, Meghan M, PA-C  amLODipine  (NORVASC ) 10 MG tablet Take 1 tablet (10 mg total) by mouth daily. 04/02/24   [name], NP  atorvastatin  (LIPITOR) 40 MG tablet Take 1 tablet (40 mg total) by mouth daily. Patient taking differently: Take 20 mg by mouth at bedtime. 06/20/22 12/02/23  [name], NP  blood glucose meter kit and supplies KIT Dispense based on patient and insurance preference. Use up to four times daily as directed. 07/05/21   Myrna Camelia HERO, NP  cetirizine  (ZYRTEC ) 10 MG tablet Take 1 tablet (10 mg total) by mouth daily. 04/02/24   [name], NP  clopidogrel  (PLAVIX ) 75 MG tablet Take 1 tablet (75 mg total) by mouth daily. 04/02/24   [name], NP  cyclobenzaprine  (FLEXERIL ) 10 MG tablet Take 0.5-1 tablets (5-10 mg total) by mouth at bedtime as needed for muscle spasms. 03/15/24   Franaszek, Amanda, PA-C  dapagliflozin  propanediol (FARXIGA ) 10 MG TABS tablet Take 1 tablet (10 mg total) by mouth daily before breakfast. 04/02/24   Nichols, Tonya S, NP  lidocaine  (LIDODERM ) 5 % Place 1 patch onto the skin daily. Apply 1 patch to area of pain for up to 12 hours.  Then remove patch for  full 12 hours before reapplying a new patch. 03/15/24   Franaszek, Amanda, PA-C  metFORMIN  (GLUCOPHAGE ) 500 MG tablet Take 1 tablet (500 mg total) by mouth daily with breakfast. 09/25/22 11/19/23  [name], NP  methocarbamol  (  ROBAXIN -750) 750 MG tablet Take 1 tablet (750 mg total) by mouth every 8 (eight) hours as needed for muscle spasms. 12/04/23   Gawne, Meghan M, PA-C  metoprolol  tartrate (LOPRESSOR ) 50 MG tablet Take 1 tablet (50 mg total) by mouth 2 (two) times daily. Take one two times a day for blood pressure. 04/02/24 04/02/25  [name], NP  ondansetron  (ZOFRAN -ODT) 4 MG disintegrating tablet Take 1 tablet (4 mg total) by mouth every 8 (eight) hours as needed for nausea or vomiting. 12/04/23   Gawne, Meghan M, PA-C  oxyCODONE  (ROXICODONE ) 5 MG immediate release tablet Take 1 tablet (5 mg total) by mouth every 4 (four) hours as needed for severe pain (pain score 7-10). after surgery that is not resolved by your normal daily dose of Oxycodone  for chronic pain 12/04/23   Gawne, Meghan M, PA-C  rivaroxaban  (XARELTO ) 10 MG TABS tablet Take 1 tablet (10 mg total) by mouth daily. For 30 days to prevent blood clots after surgery 12/04/23   Gawne, Meghan M, PA-C  sennosides-docusate sodium  (SENOKOT-S) 8.6-50 MG tablet Take 2 tablets by mouth daily as needed for constipation (while taking narcotics). 12/04/23   Gawne, Meghan M, PA-C  tamsulosin  (FLOMAX ) 0.4 MG CAPS capsule Take 2 capsules (0.8 mg total) by mouth daily. Patient taking differently: Take 0.4 mg by mouth daily. 06/20/22   [name], NP  traZODone  (DESYREL ) 100 MG tablet Take 1 tablet (100 mg total) by mouth at bedtime. Take one table by mouth at bedtime as needed for sleep. 04/02/24 04/02/25  [name], NP     Critical care time: 43 minutes     Deward Eastern, AGACNP-BC [facility]  See Amion for personal pager PCCM on call pager [phone] until 7pm. Please call Elink 7p-7a.  [phone]  04/19/2024 6:04 PM

## 2024-04-19 NOTE — ED Provider Notes (Addendum)
 [facility]    CSN: [id number] Arrival date & time: 04/19/24  1125      History   Chief Complaint Chief Complaint  Patient presents with   Weakness    HPI [name] is a 68 y.o. male.    Weakness  Here for weakness and severe abdominal pain.  His family member has brought him in as she found him very weak and having a hard time standing up when she got to the house.  He states he started feeling bad yesterday with severe abdominal pain and has been throwing up a lot.  No shortness of breath and no chest pain.  He states he has had pancreatitis in the past and he thinks it might be that bothering him again.   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/2023   Preop examination 10/28/2023   Osteoarthritis of left hip 10/28/2023   Hyperlipidemia LDL goal <55 10/28/2023   Alcoholic intoxication without complication (HCC) 05/16/2021   Marijuana use 05/16/2021   CVA (cerebral vascular accident) (HCC) 05/14/2021   Alcohol abuse 05/14/2021   Tobacco abuse 05/14/2021   Type 2 diabetes mellitus with complication, without long-term current use of insulin  (HCC) 05/14/2021   Angioedema 02/09/2017   Primary localized osteoarthritis of right hip 01/02/2016   Groin rash 06/16/2013   DM w/o complication type II (HCC) 06/16/2013   BPH (benign prostatic hyperplasia) 11/24/2012   Healthcare maintenance 11/24/2012   Pancreatitis 10/31/2012   Chronic pain syndrome 09/18/2011   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/19/2011    Right rotator cuff tear 07/26/2011   Shoulder pain, right 05/03/2011   Right ankle pain 01/23/2011   Left wrist pain 01/23/2011   Back pain 01/02/2011   MOLLUSCUM CONTAGIOSUM 12/05/2008   LEUKOCYTOSIS UNSPECIFIED 03/25/2008   LIVER FUNCTION TESTS, ABNORMAL 03/25/2008   ERECTILE DYSFUNCTION 05/12/2007   Essential hypertension 03/02/2007   Hypercholesteremia 01/08/2007   ANXIETY 01/08/2007   GERD 01/08/2007   Rheumatoid arthritis (HCC) 01/08/2007   OSTEOARTHRITIS 01/08/2007    Past Surgical History:  Procedure Laterality Date   COLONOSCOPY     FRACTURE SURGERY     cheek bone fracture    rtc     right shoulder  -- 2010   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   Procedure: TOTAL HIP ARTHROPLASTY ANTERIOR APPROACH;  Surgeon: [name], MD;  Location: [facility] OR;  Service: Orthopedics;  Laterality: Right;   TOTAL HIP ARTHROPLASTY Left 12/02/2023   Procedure: TOTAL HIP ARTHROPLASTY ANTERIOR APPROACH;  Surgeon: Chancy Evalene JONETTA, MD;  Location: [facility];  Service: Orthopedics;  Laterality: Left;   WRIST SURGERY     fusion with pins  2007       Home Medications    Prior to Admission medications   Medication Sig Start Date End Date Taking? Authorizing Provider  acetaminophen  (TYLENOL ) 500 MG tablet Take 2 tablets (1,000 mg total) by mouth every 6 (  six) hours as needed for mild pain (pain score 1-3) or moderate pain (pain score 4-6). 12/04/23   Gawne, Meghan M, PA-C  amLODipine  (NORVASC ) 10 MG tablet Take 1 tablet (10 mg total) by mouth daily. 04/02/24   [name], NP  atorvastatin  (LIPITOR) 40 MG tablet Take 1 tablet (40 mg total) by mouth daily. Patient taking differently: Take 20 mg by mouth at bedtime. 06/20/22 12/02/23  [name], NP  blood glucose meter kit and supplies KIT Dispense based on patient and insurance preference. Use up to four times daily as directed. 07/05/21   Myrna Camelia HERO, NP  cetirizine  (ZYRTEC ) 10 MG tablet Take 1 tablet (10 mg total) by mouth daily.  04/02/24   [name], NP  clopidogrel  (PLAVIX ) 75 MG tablet Take 1 tablet (75 mg total) by mouth daily. 04/02/24   [name], NP  cyclobenzaprine  (FLEXERIL ) 10 MG tablet Take 0.5-1 tablets (5-10 mg total) by mouth at bedtime as needed for muscle spasms. 03/15/24   Veta Palma, PA-C  dapagliflozin  propanediol (FARXIGA ) 10 MG TABS tablet Take 1 tablet (10 mg total) by mouth daily before breakfast. 04/02/24   Nichols, Tonya S, NP  lidocaine  (LIDODERM ) 5 % Place 1 patch onto the skin daily. Apply 1 patch to area of pain for up to 12 hours.  Then remove patch for full 12 hours before reapplying a new patch. 03/15/24   Franaszek, Amanda, PA-C  metFORMIN  (GLUCOPHAGE ) 500 MG tablet Take 1 tablet (500 mg total) by mouth daily with breakfast. 09/25/22 11/19/23  [name], NP  methocarbamol  (ROBAXIN -750) 750 MG tablet Take 1 tablet (750 mg total) by mouth every 8 (eight) hours as needed for muscle spasms. 12/04/23   Gawne, Meghan M, PA-C  metoprolol  tartrate (LOPRESSOR ) 50 MG tablet Take 1 tablet (50 mg total) by mouth 2 (two) times daily. Take one two times a day for blood pressure. 04/02/24 04/02/25  [name], NP  ondansetron  (ZOFRAN -ODT) 4 MG disintegrating tablet Take 1 tablet (4 mg total) by mouth every 8 (eight) hours as needed for nausea or vomiting. 12/04/23   Gawne, Meghan M, PA-C  oxyCODONE  (ROXICODONE ) 5 MG immediate release tablet Take 1 tablet (5 mg total) by mouth every 4 (four) hours as needed for severe pain (pain score 7-10). after surgery that is not resolved by your normal daily dose of Oxycodone  for chronic pain 12/04/23   Gawne, Meghan M, PA-C  rivaroxaban  (XARELTO ) 10 MG TABS tablet Take 1 tablet (10 mg total) by mouth daily. For 30 days to prevent blood clots after surgery 12/04/23   Gawne, Meghan M, PA-C  sennosides-docusate sodium  (SENOKOT-S) 8.6-50 MG tablet Take 2 tablets by mouth daily as needed for constipation (while taking narcotics). 12/04/23   Gawne, Meghan M, PA-C   tamsulosin  (FLOMAX ) 0.4 MG CAPS capsule Take 2 capsules (0.8 mg total) by mouth daily. Patient taking differently: Take 0.4 mg by mouth daily. 06/20/22   [name], NP  traZODone  (DESYREL ) 100 MG tablet Take 1 tablet (100 mg total) by mouth at bedtime. Take one table by mouth at bedtime as needed for sleep. 04/02/24 04/02/25  [name], NP    Family History Family History  Problem Relation Age of Onset   Kidney disease Brother    Angioedema Maternal Aunt        Tongue swelling   Heart attack Mother    Heart attack Father     Social History Social History  Tobacco Use   Smoking status: Every Day    Current packs/day: 0.40    Average packs/day: 0.4 packs/day for 25.0 years (10.0 ttl pk-yrs)    Types: Cigarettes    Passive exposure: Current   Smokeless tobacco: Never   Tobacco comments:    Trying to quit smoking.  Currently smoking 1 -2 cigarette per day and is doing OK with decrease.  Vaping Use   Vaping status: Never Used  Substance Use Topics   Alcohol use: Yes    Alcohol/week: 7.0 - 14.0 standard drinks of alcohol    Types: 7 - 14 Cans of beer per week    Comment: 4 beer/day per family   Drug use: Not Currently    Comment: hx of 20 years ago -cocaine  and marijuanan     Allergies   Lisinopril , Citalopram, Morphine , Nsaids, and Paroxetine   Review of Systems Review of Systems  Neurological:  Positive for weakness.     Physical Exam Triage Vital Signs ED Triage Vitals  Encounter Vitals Group     BP      Girls Systolic BP Percentile      Girls Diastolic BP Percentile      Boys Systolic BP Percentile      Boys Diastolic BP Percentile      Pulse      Resp      Temp      Temp src      SpO2      Weight      Height      Head Circumference      Peak Flow      Pain Score      Pain Loc      Pain Education      Exclude from Growth Chart    No data found.  Updated Vital Signs BP 120/84 (BP Location: Right Arm)   Pulse (!) 133   Temp 98 F  (36.7 C) (Temporal)   Resp (!) 28   SpO2 96%   Visual Acuity Right Eye Distance:   Left Eye Distance:   Bilateral Distance:    Right Eye Near:   Left Eye Near:    Bilateral Near:     Physical Exam Vitals (He is tachycardic at triage and on exam, with regular rhythm.) reviewed.  Constitutional:      Appearance: He is not ill-appearing or diaphoretic.     Comments: He is slumped over in the wheelchair but is able to answer questions completely.  He seems alert and oriented.  No acute respiratory distress noted, but he is breathing about 20 times a minute  HENT:     Mouth/Throat:     Mouth: Mucous membranes are moist.   Eyes:     Extraocular Movements: Extraocular movements intact.     Pupils: Pupils are equal, round, and reactive to light.    Cardiovascular:     Rate and Rhythm: Regular rhythm. Tachycardia present.  Pulmonary:     Effort: Pulmonary effort is normal.     Breath sounds: Normal breath sounds.  Abdominal:     Tenderness: There is abdominal tenderness (There is severe generalized tenderness of the abdomen.).   Musculoskeletal:     Cervical back: Neck supple.  Lymphadenopathy:     Cervical: No cervical adenopathy.   Skin:    Coloration: Skin is not pale.   Neurological:     General: No focal deficit present.     Mental Status: He is alert and  oriented to person, place, and time.   Psychiatric:        Behavior: Behavior normal.      UC Treatments / Results  Labs (all labs ordered are listed, but only abnormal results are displayed) Labs Reviewed - No data to display  EKG   Radiology No results found.  Procedures Procedures (including critical care time)  Medications Ordered in UC Medications - No data to display  Initial Impression / Assessment and Plan / UC Course  I have reviewed the triage vital signs and the nursing notes.  Pertinent labs & imaging results that were available during my care of the patient were reviewed by me and  considered in my medical decision making (see chart for details).     Fingerstick glucose is 400.  Clinical staff have started an IV, and 911 is called to transport him to the emergency room for further evaluation and treatment that we cannot provide for him in the clinic setting. Final Clinical Impressions(s) / UC Diagnoses   Final diagnoses:  Continuous severe abdominal pain  Tachycardia  Nausea and vomiting, unspecified vomiting type  Hyperglycemia     Discharge Instructions      Patient will be transported by EMS to the emergency room for further evaluation and treatment    ED Prescriptions   None    PDMP not reviewed this encounter.   Vonna Sharlet POUR, MD 04/19/24 1154    Vonna Sharlet POUR, MD 04/19/24 [phone]

## 2024-04-19 NOTE — Progress Notes (Signed)
 eLink Physician-Brief Progress Note Patient Name: KAIROS PANETTA DOB: [birth year] MRN: [id number]   Date of Service  04/19/2024  HPI/Events of Note  Refractory hypertension despite as needed hydralazine  earlier  eICU Interventions  Add on as needed antihypertensives  Endo tool to adjust insulin  dosage as hyperglycemia improves, maintain insulin  infusion     Intervention Category Intermediate Interventions: Hypertension - evaluation and management  Dyani Babel 04/19/2024, 11:03 PM

## 2024-04-19 NOTE — ED Notes (Signed)
 Patient transported to CT

## 2024-04-19 NOTE — ED Notes (Signed)
 Provider at bedside

## 2024-04-19 NOTE — ED Notes (Signed)
IV attempted x's 2 without success 

## 2024-04-19 NOTE — ED Triage Notes (Signed)
 Pt feeling bad since yesterday- abd pain, vomiting, weakness, dizzy, I think it is my pancreas. Dr. Vonna in to assess pt upon arrival to triage. Pt weak, unable to stand without assist in lobby. EMS activated.

## 2024-04-19 NOTE — ED Provider Notes (Signed)
  EMERGENCY DEPARTMENT AT [facility] Provider Note   CSN: [id number] Arrival date & time: 04/19/24  1249     Patient presents with: Weakness and Abdominal Pain  HPI [name] is a 68 y.o. male with with history of CHF, diabetes, hyperlipidemia, CVA, alcohol overuse, hypertension presenting for abdominal pain.  Started about 3 days ago with nausea and vomiting.  The pain is primarily in the right lower quadrant but does radiate all over the abdomen.  Denies radiation to the back.  Denies chest pain shortness of breath.  States he had a normal bowel movement about 3 to 4 days ago but denies diarrhea.  Still passing flatus.  Was seen earlier urgent care and sent here for further evaluation. Denies urinary symtpoms.    Weakness Associated symptoms: abdominal pain   Abdominal Pain      Prior to Admission medications   Medication Sig Start Date End Date Taking? Authorizing Provider  acetaminophen  (TYLENOL ) 500 MG tablet Take 2 tablets (1,000 mg total) by mouth every 6 (six) hours as needed for mild pain (pain score 1-3) or moderate pain (pain score 4-6). 12/04/23   Gawne, Meghan M, PA-C  amLODipine  (NORVASC ) 10 MG tablet Take 1 tablet (10 mg total) by mouth daily. 04/02/24   [name], NP  atorvastatin  (LIPITOR) 40 MG tablet Take 1 tablet (40 mg total) by mouth daily. Patient taking differently: Take 20 mg by mouth at bedtime. 06/20/22 12/02/23  [name], NP  blood glucose meter kit and supplies KIT Dispense based on patient and insurance preference. Use up to four times daily as directed. 07/05/21   Myrna Camelia HERO, NP  cetirizine  (ZYRTEC ) 10 MG tablet Take 1 tablet (10 mg total) by mouth daily. 04/02/24   [name], NP  clopidogrel  (PLAVIX ) 75 MG tablet Take 1 tablet (75 mg total) by mouth daily. 04/02/24   [name], NP  cyclobenzaprine  (FLEXERIL ) 10 MG tablet Take 0.5-1 tablets (5-10 mg total) by mouth at bedtime as needed for muscle spasms.  03/15/24   Franaszek, Amanda, PA-C  dapagliflozin  propanediol (FARXIGA ) 10 MG TABS tablet Take 1 tablet (10 mg total) by mouth daily before breakfast. 04/02/24   Nichols, Tonya S, NP  lidocaine  (LIDODERM ) 5 % Place 1 patch onto the skin daily. Apply 1 patch to area of pain for up to 12 hours.  Then remove patch for full 12 hours before reapplying a new patch. 03/15/24   Veta Palma, PA-C  metFORMIN  (GLUCOPHAGE ) 500 MG tablet Take 1 tablet (500 mg total) by mouth daily with breakfast. 09/25/22 11/19/23  [name], NP  methocarbamol  (ROBAXIN -750) 750 MG tablet Take 1 tablet (750 mg total) by mouth every 8 (eight) hours as needed for muscle spasms. 12/04/23   Gawne, Meghan M, PA-C  metoprolol  tartrate (LOPRESSOR ) 50 MG tablet Take 1 tablet (50 mg total) by mouth 2 (two) times daily. Take one two times a day for blood pressure. 04/02/24 04/02/25  [name], NP  ondansetron  (ZOFRAN -ODT) 4 MG disintegrating tablet Take 1 tablet (4 mg total) by mouth every 8 (eight) hours as needed for nausea or vomiting. 12/04/23   Gawne, Meghan M, PA-C  oxyCODONE  (ROXICODONE ) 5 MG immediate release tablet Take 1 tablet (5 mg total) by mouth every 4 (four) hours as needed for severe pain (pain score 7-10). after surgery that is not resolved by your normal daily dose of Oxycodone  for chronic pain 12/04/23   Gawne, Meghan M,  PA-C  rivaroxaban  (XARELTO ) 10 MG TABS tablet Take 1 tablet (10 mg total) by mouth daily. For 30 days to prevent blood clots after surgery 12/04/23   Gawne, Meghan M, PA-C  sennosides-docusate sodium  (SENOKOT-S) 8.6-50 MG tablet Take 2 tablets by mouth daily as needed for constipation (while taking narcotics). 12/04/23   Gawne, Meghan M, PA-C  tamsulosin  (FLOMAX ) 0.4 MG CAPS capsule Take 2 capsules (0.8 mg total) by mouth daily. Patient taking differently: Take 0.4 mg by mouth daily. 06/20/22   [name], NP  traZODone  (DESYREL ) 100 MG tablet Take 1 tablet (100 mg total) by mouth at bedtime. Take one  table by mouth at bedtime as needed for sleep. 04/02/24 04/02/25  [name], NP    Allergies: Lisinopril , Citalopram, Morphine , Nsaids, and Paroxetine    Review of Systems  Gastrointestinal:  Positive for abdominal pain.  Neurological:  Positive for weakness.    Updated Vital Signs BP (!) 198/104   Pulse (!) 108   Temp 97.6 F (36.4 C) (Axillary)   Resp (!) 30   Ht 5' 7 (1.702 m)   Wt 66.7 kg   SpO2 95%   BMI 23.02 kg/m   Physical Exam Vitals and nursing note reviewed.  HENT:     Head: Normocephalic and atraumatic.     Mouth/Throat:     Mouth: Mucous membranes are moist.   Eyes:     General:        Right eye: No discharge.        Left eye: No discharge.     Conjunctiva/sclera: Conjunctivae normal.    Cardiovascular:     Rate and Rhythm: Regular rhythm. Tachycardia present.     Pulses: Normal pulses.     Heart sounds: Normal heart sounds.  Pulmonary:     Effort: Pulmonary effort is normal.     Breath sounds: Normal breath sounds.  Abdominal:     General: Abdomen is flat.     Palpations: Abdomen is soft.     Tenderness: There is abdominal tenderness in the right lower quadrant.   Skin:    General: Skin is warm and dry.   Neurological:     General: No focal deficit present.   Psychiatric:        Mood and Affect: Mood normal.      (all labs ordered are listed, but only abnormal results are displayed) Labs Reviewed  CBC - Abnormal; Notable for the following components:      Result Value   WBC 22.6 (*)    RBC 6.19 (*)    Hemoglobin 20.6 (*)    HCT 56.6 (*)    MCHC 36.4 (*)    All other components within normal limits  BLOOD GAS, VENOUS - Abnormal; Notable for the following components:   pCO2, Ven 31 (*)    pO2, Ven 52 (*)    Bicarbonate 15.3 (*)    Acid-base deficit 9.9 (*)    All other components within normal limits  CBG MONITORING, ED - Abnormal; Notable for the following components:   Glucose-Capillary 443 (*)    All other components  within normal limits  I-STAT CG4 LACTIC ACID, ED - Abnormal; Notable for the following components:   Lactic Acid, Venous 6.9 (*)    All other components within normal limits  I-STAT CHEM 8, ED - Abnormal; Notable for the following components:   Sodium 127 (*)    Potassium 6.2 (*)    Creatinine, Ser 1.50 (*)  Glucose, Bld 377 (*)    Calcium , Ion 0.96 (*)    TCO2 17 (*)    Hemoglobin 17.3 (*)    All other components within normal limits  RESP PANEL BY RT-PCR (RSV, FLU A&B, COVID)  RVPGX2  CULTURE, BLOOD (ROUTINE X 2)  CULTURE, BLOOD (ROUTINE X 2)  PROTIME-INR  URINALYSIS, W/ REFLEX TO CULTURE (INFECTION SUSPECTED)  LIPASE, BLOOD  COMPREHENSIVE METABOLIC PANEL WITH GFR  LIPASE, BLOOD  I-STAT CG4 LACTIC ACID, ED    EKG: EKG Interpretation Date/Time:  Monday April 19 2024 13:00:37 EDT Ventricular Rate:  118 PR Interval:  129 QRS Duration:  84 QT Interval:  298 QTC Calculation: 418 R Axis:   50  Text Interpretation: Sinus tachycardia Atrial premature complex Biatrial enlargement Borderline repolarization abnormality Since prior ECG, hrate has increased Confirmed by Dreama Longs ([id number]) on 04/19/2024 3:03:20 PM  Radiology: [facility] Chest Port 1 View Result Date: 04/19/2024 CLINICAL DATA:  Questionable sepsis - evaluate for abnormality. Nausea, vomiting, weakness EXAM: PORTABLE CHEST 1 VIEW COMPARISON:  05/14/2021 FINDINGS: The heart size and mediastinal contours are within normal limits. Both lungs are clear. The visualized skeletal structures are unremarkable. IMPRESSION: No active disease. Electronically Signed   By: Franky Crease M.D.   On: 04/19/2024 14:58     .Critical Care  Performed by: Lang Norleen POUR, PA-C Authorized by: Lang Norleen POUR, PA-C   Critical care provider statement:    Critical care time (minutes):  30   Critical care was necessary to treat or prevent imminent or life-threatening deterioration of the following conditions:  Sepsis and renal failure    Critical care was time spent personally by me on the following activities:  Development of treatment plan with patient or surrogate, discussions with consultants, evaluation of patient's response to treatment, examination of patient, ordering and review of laboratory studies, ordering and review of radiographic studies, ordering and performing treatments and interventions, pulse oximetry, re-evaluation of patient's condition and review of old charts    Medications Ordered in the ED  lactated ringers  infusion (has no administration in time range)  lactated ringers  bolus 1,000 mL (has no administration in time range)  insulin  aspart (novoLOG ) injection 5 Units (has no administration in time range)  lactated ringers  bolus 1,000 mL (1,000 mLs Intravenous New Bag/Given 04/19/24 1420)  cefTRIAXone (ROCEPHIN) 2 g in sodium chloride  0.9 % 100 mL IVPB (2 g Intravenous New Bag/Given 04/19/24 1428)  metroNIDAZOLE (FLAGYL) IVPB 500 mg (500 mg Intravenous New Bag/Given 04/19/24 1421)  ondansetron  (ZOFRAN ) injection 4 mg (4 mg Intravenous Given 04/19/24 1441)  HYDROmorphone  (DILAUDID ) injection 1 mg (1 mg Intravenous Given 04/19/24 1441)  iohexol  (OMNIPAQUE ) 300 MG/ML solution 100 mL (100 mLs Intravenous Contrast Given 04/19/24 1512)                                    Medical Decision Making Amount and/or Complexity of Data Reviewed Labs: ordered. Radiology: ordered.  Risk OTC drugs. Prescription drug management.   Initial Impression and Ddx 68 year old well-appearing male presenting for abdominal pain.  Exam notable for generalized abdominal tenderness, tachycardia, tachypnea.  This prompted concern for sepsis.  Started him on antibiotics, fluids and drew blood cultures and pertinent labs.  DDx includes appendicitis, acute cholecystitis, kidney stone, UTI, sepsis, electrolyte derangement, dehydration, DKA, other. Patient PMH that increases complexity of ED encounter:  history of CHF, diabetes,  hyperlipidemia, CVA, alcohol overuse, hypertension  Interpretation of Diagnostics - I independent reviewed and interpreted the labs as followed: Leukocytosis 22.6, lactic acid 6.9, hemoglobin 20.6, creatinine 1.5 is~2 times elevated over baseline, hyperkalemia (on chem-8) at 6.2  - CT ab/pelvis is pending  - I personally reviewed and interpreted EKG which revealed sinus tachycardia  Patient Reassessment and Ultimate Disposition/Management Workup concerning for sepsis likely associated with intra-abdominal cause versus severe dehydration.  Essentially treating for both at this time.  On ceftriaxone and Flagyl.  Administering 2 L of LR.  For his hyperkalemia gave insulin  but no discernible EKG changes.  Will await CMP to make adjustments if needed.  Will need admission.  CT abdomen pelvis is pending.  Signed out to [employer] PA.  Patient management required discussion with the following services or consulting groups:  None  Complexity of Problems Addressed Acute complicated illness or Injury  Additional Data Reviewed and Analyzed Further history obtained from: Past medical history and medications listed in the EMR and Prior ED visit notes  Patient Encounter Risk Assessment Consideration of hospitalization      Final diagnoses:  Abdominal pain, unspecified abdominal location    ED Discharge Orders     None          Lang Norleen POUR, PA-C 04/19/24 1530    Dreama Longs, MD 04/19/24 2319

## 2024-04-19 NOTE — ED Provider Notes (Addendum)
  Physical Exam  BP (!) 198/104   Pulse (!) 108   Temp 97.6 F (36.4 C) (Axillary)   Resp (!) 30   Ht 5' 7 (1.702 m)   Wt 66.7 kg   SpO2 95%   BMI 23.02 kg/m   Physical Exam  Procedures  Procedures  ED Course / MDM    Medical Decision Making Amount and/or Complexity of Data Reviewed Labs: ordered. Radiology: ordered.  Risk OTC drugs. Prescription drug management.   AP x 3 days, RLQ, vomiting Tachycardic, tachypneic Leukocytosis 20, K+ 6.2 (no EKG changes) LA 6.9 Cr 1.5 (0.7) Getting fluids - sepsis work up in progress CT pending Ab/pel DDx: Sepsis, DKA, severe dehydration  Cmet hemolyzed, is pending repeat IMPRESSION: 1. 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 No abscess or pseudocyst. 2. 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 3. Fatty liver with early cirrhosis. 4. Colonic diverticulosis. No bowel obstruction. Normal appendix. 5. Aortic Atherosclerosis (ICD10-I70.0).   Discussed these findings with the patient and all questions answered. Antibiotics ordered.   Dr. Dianna with GI paged to provide consultation Intensivist paged for their involvement and admission.    Lactic acid repeat increased to 7.6 Meropenem ordered BP improved after IV labetalol LR bolus complete, now infusing   17:10 - discussed with critical care.  17:35 - discussed with GI, Dr. Dianna, who agrees with Meropenem. He will see the patient tomorrow in consultation.   CRITICAL CARE Performed by: Margit DELENA Paris   Total critical care time: 40 minutes  Critical care time was exclusive of separately billable procedures and treating other patients.  Critical care was necessary to treat or prevent imminent or life-threatening deterioration.  Critical care was time spent personally by me on the following activities: development of treatment plan with patient and/or surrogate as well as  nursing, discussions with consultants, evaluation of patient's response to treatment, examination of patient, obtaining history from patient or surrogate, ordering and performing treatments and interventions, ordering and review of laboratory studies, ordering and review of radiographic studies, pulse oximetry and re-evaluation of patient's condition.       Paris Margit, PA-C 04/19/24 1519    Paris Margit, PA-C 04/19/24 1741    Dreama Longs, MD 04/19/24 [phone]

## 2024-04-19 NOTE — Progress Notes (Signed)
 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  Allergies  Allergen Reactions   Lisinopril  Other (See Comments)    angioedema   Citalopram Nausea Only   Morphine  Nausea And Vomiting   Nsaids Other (See Comments)    Stomach Irritability    Paroxetine Nausea And Vomiting    Patient Measurements: Height: 5' 7 (170.2 cm) Weight: 66.7 kg (147 lb) IBW/kg (Calculated) : 66.1 HEPARIN DW (KG): 66.7  Vital Signs: Temp: 96.3 F (35.7 C) (06/23 1658) Temp Source: Axillary (06/23 1658) BP: 163/103 (06/23 1730) Pulse Rate: 86 (06/23 1730)  Labs: Recent Labs    04/19/24 1331 04/19/24 1420 04/19/24 1532  HGB 20.6* 17.3*  --   HCT 56.6* 51.0  --   PLT 218  --   --   LABPROT 14.0  --   --   INR 1.1  --   --   CREATININE  --  1.50* 1.48*    Estimated Creatinine Clearance: 45.3 mL/min (A) (by C-G formula based on SCr of 1.48 mg/dL (H)).   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  CT scan shows thrombus extending from the SMV into the porta splenic confluence and proximal main portal vein.   Goal of Therapy:  Heparin level 0.3-0.7 units/ml Monitor platelets by anticoagulation protocol: Yes   Plan:  Baseline aPTT Initiate IV heparin with 4000 units bolus per  infusion then 1100 units/hr 6 hour heparin level for titration Monitor daily heparin level, CBC, signs/symptoms of bleeding   Thank you for allowing pharmacy to be a part of this patient's care.  Eleanor EMERSON Agent, PharmD, BCPS Clinical Pharmacist [facility] 04/19/2024 6:00 PM

## 2024-04-19 NOTE — Telephone Encounter (Signed)
 Completed.

## 2024-04-19 NOTE — Progress Notes (Signed)
 PHARMACY NOTE:  ANTIMICROBIAL RENAL DOSAGE ADJUSTMENT  Current antimicrobial regimen includes a mismatch between antimicrobial dosage and estimated renal function.  As per policy approved by the Pharmacy & Therapeutics and Medical Executive Committees, the antimicrobial dosage will be adjusted accordingly.  Current antimicrobial dosage:  meropenem 1 g IV q8h  Indication: IAI, necrotizing pancreatitis  Renal Function:  Estimated Creatinine Clearance: 45.3 mL/min (A) (by C-G formula based on SCr of 1.48 mg/dL (H)). []      On intermittent HD, scheduled: []      On CRRT    Antimicrobial dosage has been changed to:  meropenem 1 g IV q12h  Additional comments:   Thank you for allowing pharmacy to be a part of this patient's care.  Eleanor Agent, [facility] 04/19/2024 5:26 PM

## 2024-04-20 DIAGNOSIS — F1010 Alcohol abuse, uncomplicated: Secondary | ICD-10-CM | POA: Diagnosis not present

## 2024-04-20 DIAGNOSIS — I5032 Chronic diastolic (congestive) heart failure: Secondary | ICD-10-CM

## 2024-04-20 DIAGNOSIS — E8729 Other acidosis: Secondary | ICD-10-CM | POA: Diagnosis not present

## 2024-04-20 DIAGNOSIS — N179 Acute kidney failure, unspecified: Secondary | ICD-10-CM | POA: Diagnosis not present

## 2024-04-20 DIAGNOSIS — K8591 Acute pancreatitis with uninfected necrosis, unspecified: Secondary | ICD-10-CM | POA: Diagnosis not present

## 2024-04-20 LAB — CBC
HCT: 56.7 % — ABNORMAL HIGH (ref 39.0–52.0)
HCT: 51.4 % (ref 39.0–52.0)
Hemoglobin: 20.3 g/dL — ABNORMAL HIGH (ref 13.0–17.0)
Hemoglobin: 18.6 g/dL — ABNORMAL HIGH (ref 13.0–17.0)
MCH: 33.3 pg (ref 26.0–34.0)
MCH: 33.2 pg (ref 26.0–34.0)
MCHC: 35.8 g/dL (ref 30.0–36.0)
MCHC: 36.2 g/dL — ABNORMAL HIGH (ref 30.0–36.0)
MCV: 93 fL (ref 80.0–100.0)
MCV: 91.8 fL (ref 80.0–100.0)
Platelets: 113 10*3/uL — ABNORMAL LOW (ref 150–400)
Platelets: 97 10*3/uL — ABNORMAL LOW (ref 150–400)
RBC: 6.1 MIL/uL — ABNORMAL HIGH (ref 4.22–5.81)
RBC: 5.6 MIL/uL (ref 4.22–5.81)
RDW: 12.7 % (ref 11.5–15.5)
RDW: 13.1 % (ref 11.5–15.5)
WBC: 24.4 10*3/uL — ABNORMAL HIGH (ref 4.0–10.5)
WBC: 20.2 10*3/uL — ABNORMAL HIGH (ref 4.0–10.5)
nRBC: 0 % (ref 0.0–0.2)
nRBC: 0 % (ref 0.0–0.2)

## 2024-04-20 LAB — GLUCOSE, CAPILLARY
Glucose-Capillary: 128 mg/dL — ABNORMAL HIGH (ref 70–99)
Glucose-Capillary: 133 mg/dL — ABNORMAL HIGH (ref 70–99)
Glucose-Capillary: 136 mg/dL — ABNORMAL HIGH (ref 70–99)
Glucose-Capillary: 140 mg/dL — ABNORMAL HIGH (ref 70–99)
Glucose-Capillary: 145 mg/dL — ABNORMAL HIGH (ref 70–99)
Glucose-Capillary: 146 mg/dL — ABNORMAL HIGH (ref 70–99)
Glucose-Capillary: 149 mg/dL — ABNORMAL HIGH (ref 70–99)
Glucose-Capillary: 152 mg/dL — ABNORMAL HIGH (ref 70–99)
Glucose-Capillary: 153 mg/dL — ABNORMAL HIGH (ref 70–99)
Glucose-Capillary: 154 mg/dL — ABNORMAL HIGH (ref 70–99)
Glucose-Capillary: 155 mg/dL — ABNORMAL HIGH (ref 70–99)
Glucose-Capillary: 160 mg/dL — ABNORMAL HIGH (ref 70–99)
Glucose-Capillary: 164 mg/dL — ABNORMAL HIGH (ref 70–99)
Glucose-Capillary: 174 mg/dL — ABNORMAL HIGH (ref 70–99)
Glucose-Capillary: 178 mg/dL — ABNORMAL HIGH (ref 70–99)
Glucose-Capillary: 179 mg/dL — ABNORMAL HIGH (ref 70–99)
Glucose-Capillary: 179 mg/dL — ABNORMAL HIGH (ref 70–99)
Glucose-Capillary: 186 mg/dL — ABNORMAL HIGH (ref 70–99)
Glucose-Capillary: 212 mg/dL — ABNORMAL HIGH (ref 70–99)

## 2024-04-20 LAB — COMPREHENSIVE METABOLIC PANEL WITH GFR
ALT: 45 U/L — ABNORMAL HIGH (ref 0–44)
AST: 59 U/L — ABNORMAL HIGH (ref 15–41)
Albumin: 2.8 g/dL — ABNORMAL LOW (ref 3.5–5.0)
Alkaline Phosphatase: 58 U/L (ref 38–126)
Anion gap: 12 (ref 5–15)
BUN: 21 mg/dL (ref 8–23)
CO2: 17 mmol/L — ABNORMAL LOW (ref 22–32)
Calcium: 8.1 mg/dL — ABNORMAL LOW (ref 8.9–10.3)
Chloride: 101 mmol/L (ref 98–111)
Creatinine, Ser: 1.57 mg/dL — ABNORMAL HIGH (ref 0.61–1.24)
GFR, Estimated: 48 mL/min — ABNORMAL LOW (ref >60)
Glucose, Bld: 152 mg/dL — ABNORMAL HIGH (ref 70–99)
Potassium: 4.2 mmol/L (ref 3.5–5.1)
Sodium: 130 mmol/L — ABNORMAL LOW (ref 135–145)
Total Bilirubin: 2.4 mg/dL — ABNORMAL HIGH (ref 0.0–1.2)
Total Protein: 6.3 g/dL — ABNORMAL LOW (ref 6.5–8.1)

## 2024-04-20 LAB — HEPARIN LEVEL (UNFRACTIONATED)
Heparin Unfractionated: 0.79 [IU]/mL — ABNORMAL HIGH (ref 0.30–0.70)
Heparin Unfractionated: 0.97 [IU]/mL — ABNORMAL HIGH (ref 0.30–0.70)
Heparin Unfractionated: 0.62 [IU]/mL (ref 0.30–0.70)

## 2024-04-20 LAB — MAGNESIUM: Magnesium: 1.8 mg/dL (ref 1.7–2.4)

## 2024-04-20 LAB — URINE CULTURE: Culture: <10000 — AB

## 2024-04-20 LAB — PHOSPHORUS: Phosphorus: 2.2 mg/dL — ABNORMAL LOW (ref 2.5–4.6)

## 2024-04-20 LAB — C-REACTIVE PROTEIN: CRP: 23.5 mg/dL — ABNORMAL HIGH (ref <1.0)

## 2024-04-20 MED ORDER — LORAZEPAM 2 MG/ML IJ SOLN
1.0000 mg | INTRAMUSCULAR | Status: DC | PRN
  Administered 2024-04-20: 2 mg via INTRAVENOUS
  Administered 2024-04-20: 4 mg via INTRAVENOUS
  Administered 2024-04-20: 3 mg via INTRAVENOUS
  Administered 2024-04-20 (×2): 2 mg via INTRAVENOUS
  Administered 2024-04-21 (×3): 4 mg via INTRAVENOUS
  Administered 2024-04-21: 2 mg via INTRAVENOUS
  Administered 2024-04-21: 3 mg via INTRAVENOUS
  Administered 2024-04-21 – 2024-04-22 (×3): 2 mg via INTRAVENOUS
  Filled 2024-04-20: qty 1
  Filled 2024-04-20: qty 2
  Filled 2024-04-20 (×2): qty 1
  Filled 2024-04-20: qty 2
  Filled 2024-04-20: qty 1
  Filled 2024-04-20: qty 2
  Filled 2024-04-20 (×3): qty 1
  Filled 2024-04-20: qty 2
  Filled 2024-04-20: qty 1
  Filled 2024-04-20 (×2): qty 2

## 2024-04-20 MED ORDER — MAGNESIUM SULFATE 2 GM/50ML IV SOLN
2.0000 g | Freq: Once | INTRAVENOUS | Status: AC
  Administered 2024-04-20: 2 g via INTRAVENOUS
  Filled 2024-04-20: qty 50

## 2024-04-20 MED ORDER — ADULT MULTIVITAMIN W/MINERALS CH
1.0000 | ORAL_TABLET | Freq: Every day | ORAL | Status: DC
  Administered 2024-04-22: 1 via ORAL
  Filled 2024-04-20: qty 1

## 2024-04-20 MED ORDER — THIAMINE MONONITRATE 100 MG PO TABS
100.0000 mg | ORAL_TABLET | Freq: Every day | ORAL | Status: DC
  Administered 2024-04-22: 100 mg via ORAL
  Filled 2024-04-20: qty 1

## 2024-04-20 MED ORDER — METOPROLOL TARTRATE 5 MG/5ML IV SOLN
5.0000 mg | Freq: Three times a day (TID) | INTRAVENOUS | Status: DC
  Administered 2024-04-20 – 2024-04-22 (×7): 5 mg via INTRAVENOUS
  Filled 2024-04-20 (×7): qty 5

## 2024-04-20 MED ORDER — THIAMINE HCL 100 MG/ML IJ SOLN
100.0000 mg | Freq: Every day | INTRAMUSCULAR | Status: DC
  Administered 2024-04-21: 100 mg via INTRAVENOUS
  Filled 2024-04-20: qty 2

## 2024-04-20 MED ORDER — LACTATED RINGERS IV SOLN: INTRAVENOUS | Status: AC

## 2024-04-20 MED ORDER — LORAZEPAM 1 MG PO TABS: 1.0000 mg | ORAL_TABLET | ORAL | Status: DC | PRN

## 2024-04-20 MED ORDER — FOLIC ACID 1 MG PO TABS: 1.0000 mg | ORAL_TABLET | Freq: Every day | ORAL | Status: DC

## 2024-04-20 MED ORDER — SODIUM PHOSPHATES 45 MMOLE/15ML IV SOLN
15.0000 mmol | Freq: Once | INTRAVENOUS | Status: AC
  Administered 2024-04-20: 15 mmol via INTRAVENOUS
  Filled 2024-04-20: qty 5

## 2024-04-20 NOTE — Plan of Care (Signed)
  Problem: Pain Managment: Goal: General experience of comfort will improve and/or be controlled Outcome: Progressing   Problem: Safety: Goal: Ability to remain free from injury will improve Outcome: Progressing   Problem: Skin Integrity: Goal: Risk for impaired skin integrity will decrease Outcome: Progressing

## 2024-04-20 NOTE — Progress Notes (Signed)
 [facility] Progress Note     Subjective: CC:  Somnolent, arouses to light touch Denies pain Reports his name, states he is in the hospital for headaches Reports drinking 30 beers daily  Afebrile, HR107-140 bpm sinus, hypertensive Currently on IVF @ 150 mL/hr UOP 250 mL last 12h WBC 24 from 22  Objective: Vital signs in last 24 hours: Temp:  [96.3 F (35.7 C)-98.4 F (36.9 C)] 97.7 F (36.5 C) (06/24 0400) Pulse Rate:  [83-139] 107 (06/24 0700) Resp:  [23-37] 32 (06/24 0700) BP: (120-225)/(84-125) 166/86 (06/24 0700) SpO2:  [90 %-99 %] 94 % (06/24 0700) Weight:  [55.1 kg-66.7 kg] 55.1 kg (06/24 0323) Last BM Date :  (PTA)  Intake/Output from previous day: 06/23 0701 - 06/24 0700 In: 3804.5 [I.V.:2253.8; IV Piggyback:1550.7] Out: 250 [Urine:250] Intake/Output this shift: No intake/output data recorded.  PE: Gen:  somnolent, arouses to touch Card:  sinus tachycardia, no lower extremity edema  Pulm:  Normal effort ORA Abd: Soft, mild distention, globally tender, worse in epigastric and periumbilical region without peritonitis, no rigidity Skin: warm and dry, no rashes  Psych: A&Ox2 - person/hospital   Lab Results:  Recent Labs    04/19/24 1331 04/19/24 1420 04/20/24 0143  WBC 22.6*  --  24.4*  HGB 20.6* 17.3* 20.3*  HCT 56.6* 51.0 56.7*  PLT 218  --  113*   BMET Recent Labs    04/19/24 1532 04/20/24 0143  NA 125* 130*  K 3.1* 4.2  CL 93* 101  CO2 15* 17*  GLUCOSE 348* 152*  BUN 13 21  CREATININE 1.48* 1.57*  CALCIUM  7.7* 8.1*   PT/INR Recent Labs    04/19/24 1331  LABPROT 14.0  INR 1.1   CMP     Component Value Date/Time   NA 130 (L) 04/20/2024 0143   NA 139 04/02/2024 1100   K 4.2 04/20/2024 0143   CL 101 04/20/2024 0143   CO2 17 (L) 04/20/2024 0143   GLUCOSE 152 (H) 04/20/2024 0143   BUN 21 04/20/2024 0143   BUN 6 (L) 04/02/2024 1100   CREATININE 1.57 (H) 04/20/2024 0143   CREATININE 0.81 02/12/2017 1003   CALCIUM   8.1 (L) 04/20/2024 0143   PROT 6.3 (L) 04/20/2024 0143   PROT 7.1 04/02/2024 1100   ALBUMIN  2.8 (L) 04/20/2024 0143   ALBUMIN  4.3 04/02/2024 1100   AST 59 (H) 04/20/2024 0143   ALT 45 (H) 04/20/2024 0143   ALKPHOS 58 04/20/2024 0143   BILITOT 2.4 (H) 04/20/2024 0143   BILITOT 0.5 04/02/2024 1100   GFRNONAA 48 (L) 04/20/2024 0143   GFRNONAA >89 02/12/2017 1003   GFRAA 94 09/28/2020 1511   GFRAA >89 02/12/2017 1003   Lipase     Component Value Date/Time   LIPASE 298 (H) 04/19/2024 1532       Studies/Results: CT ABDOMEN PELVIS W CONTRAST Result Date: 04/19/2024 CLINICAL DATA:  Abdominal pain. EXAM: CT ABDOMEN AND PELVIS WITH CONTRAST TECHNIQUE: Multidetector CT imaging of the abdomen and pelvis was performed using the standard protocol following bolus administration of intravenous contrast. RADIATION DOSE REDUCTION: This exam was performed according to the departmental dose-optimization program which includes automated exposure control, adjustment of the mA and/or kV according to patient size and/or use of iterative reconstruction technique. CONTRAST:  OMNIPAQUE  IOHEXOL  300 MG/ML  SOLN COMPARISON:  None Available. FINDINGS: Lower chest: Bibasilar subpleural atelectasis. No intra-abdominal free air.  Small ascites. Hepatobiliary: Fatty liver. There is irregularity of the liver contour suggestive of early  cirrhosis. No biliary dilatation. The gallbladder is distended. No calcified gallstone. Pancreas: Severe inflammatory changes of the pancreas consistent with acute pancreatitis. Areas of non enhancement involving the uncinate process as well as body and tail of the pancreas suggest areas of necrosis. Significant amount of inflammatory fluid adjacent to the pancreas. No organized fluid collection. Spleen: Normal in size without focal abnormality. Adrenals/Urinary Tract: The adrenal glands unremarkable. Small bilateral renal cysts. There is no hydronephrosis on either side. The visualized  ureters appear unremarkable. The urinary bladder is poorly visualized due to streak artifact caused by bilateral hip arthroplasties. Stomach/Bowel: There is diverticulosis of the proximal colon. There is no bowel obstruction or active inflammation. The appendix is normal. Vascular/Lymphatic: Mild aortoiliac atherosclerotic disease. The IVC is unremarkable. There is thrombus extending from the SMV into the porta splenic confluence and proximal main portal vein. There is high-grade narrowing of the SMV close to the porta splenic confluence. The main portal vein remains patent. No portal venous gas. No adenopathy. Reproductive: The prostate gland is not visualized due to streak artifact. Other: None Musculoskeletal: Degenerative changes of the spine. Bilateral total hip arthroplasties. No acute osseous pathology. IMPRESSION: 1. 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 No abscess or pseudocyst. 2. 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 3. Fatty liver with early cirrhosis. 4. Colonic diverticulosis. No bowel obstruction. Normal appendix. 5.  Aortic Atherosclerosis (ICD10-I70.0). These results were called by telephone at the time of interpretation on 04/19/2024 at 3:36 pm to Joen Paris, PA, who verbally acknowledged these results. Electronically Signed   By: Vanetta Chou M.D.   On: 04/19/2024 15:48   DG Chest Port 1 View Result Date: 04/19/2024 CLINICAL DATA:  Questionable sepsis - evaluate for abnormality. Nausea, vomiting, weakness EXAM: PORTABLE CHEST 1 VIEW COMPARISON:  05/14/2021 FINDINGS: The heart size and mediastinal contours are within normal limits. Both lungs are clear. The visualized skeletal structures are unremarkable. IMPRESSION: No active disease. Electronically Signed   By: Franky Crease M.D.   On: 04/19/2024 14:58    Anti-infectives: Anti-infectives (From admission, onward)    Start     Dose/Rate  Route Frequency Ordered Stop   04/20/24 1800  meropenem (MERREM) 1 g in sodium chloride  0.9 % 100 mL IVPB  Status:  Discontinued        1 g 200 mL/hr over 30 Minutes Intravenous Every 12 hours 04/19/24 1725 04/19/24 1751   04/19/24 1715  meropenem (MERREM) 1 g in sodium chloride  0.9 % 100 mL IVPB  Status:  Discontinued        1 g 200 mL/hr over 30 Minutes Intravenous Every 8 hours 04/19/24 1705 04/19/24 1725   04/19/24 1415  cefTRIAXone (ROCEPHIN) 2 g in sodium chloride  0.9 % 100 mL IVPB        2 g 200 mL/hr over 30 Minutes Intravenous Once 04/19/24 1404 04/19/24 1458   04/19/24 1415  metroNIDAZOLE (FLAGYL) IVPB 500 mg        500 mg 100 mL/hr over 60 Minutes Intravenous  Once 04/19/24 1404 04/19/24 1521   04/19/24 1345  vancomycin (VANCOREADY) IVPB 1500 mg/300 mL  Status:  Discontinued        1,500 mg 150 mL/hr over 120 Minutes Intravenous  Once 04/19/24 1333 04/19/24 1403   04/19/24 1330  ceFEPIme (MAXIPIME) 2 g in sodium chloride  0.9 % 100 mL IVPB  Status:  Discontinued        2 g 200 mL/hr  over 30 Minutes Intravenous  Once 04/19/24 1326 04/19/24 1403   04/19/24 1330  metroNIDAZOLE (FLAGYL) IVPB 500 mg  Status:  Discontinued        500 mg 100 mL/hr over 60 Minutes Intravenous  Once 04/19/24 1326 04/19/24 1403   04/19/24 1330  vancomycin (VANCOCIN) IVPB 1000 mg/200 mL premix  Status:  Discontinued        1,000 mg 200 mL/hr over 60 Minutes Intravenous  Once 04/19/24 1326 04/19/24 1332        Assessment/Plan  68 yo male with severe acute pancreatitis with necrosis, presumably from EtOH use He has diffuse peripancreatic stranding with developing necrosis across the gland, but no walled-off collections, and no gas within the necrosis to suggest infection. Patient is very early in the course of pancreatitis and there is no indication for acute surgical intervention. - No indication for antibiotics in the absence of infected necrosis - Aggressive IV fluid hydration and supportive care -  Patient will need nutritional support. Would have a low threshold to place a nasojejunal feeding tube. - Surgery will follow   Non-occlusive SMV thrombus - secondary to above, on hep gtt   FEN: NPO, IVF ID: none VTE: hep gtt Foley: none at present, spont voids Dispo: ICU/CCM service   Other PMH: EtOH abuse - currently on scheduled ativan  AKI DM2 HFpEF HTN RA Tobacco abuse    LOS: 1 day   I reviewed nursing notes, hospitalist notes, last 24 h vitals and pain scores, last 48 h intake and output, last 24 h labs and trends, and last 24 h imaging results.  This care required straightforward level of medical decision making.   Almarie Pringle, PA-C [facility] Please see Amion for pager number during day hours 7:00am-4:30pm

## 2024-04-20 NOTE — Progress Notes (Signed)
 NAME:  [name], MRN:  [id number], DOB:  1956-08-30, LOS: 1 ADMISSION DATE:  04/19/2024, CONSULTATION DATE:  6/23 REFERRING MD:  Dr. Dreama , CHIEF COMPLAINT:  Pancreatitis   History of Present Illness:  68 year old male with PMH as below, which is significant for alcohol abuse, RA, pancreatitis, CHF, and DM. He was admitted in February of this year for a total hip arthoplasty for osteoarthritis. Drinks 10 beers per day. The perioperative course was uncomplicated. He now is presenting to [facility] ED 6/23 with complaints of abdominal pain for 3 days. Pain was diffuse, but worse over the right abdominal quadrants. CT of the abdomen demonstrated pancreatitis with necrosis. Lipase 283. CT also noted thrombus extending from the SMV into the portal splenic confluence. He was given IV fluids and started on antibiotics. He was also hypertensive and tachycardic, which was treated effectively with labetalol. GI was consulted by the emergency department and PCCM was asked to admit for severe pancreatitis.   Pertinent  Medical History   has a past medical history of Alcohol abuse, Allergic rhinitis, CHF (congestive heart failure) (HCC), Diabetes mellitus without complication (HCC), GERD (gastroesophageal reflux disease), Hepatitis, Hyperlipidemia, Hypertension, Osteoarthritis, Pancreatitis, Rheumatoid arthritis(714.0), Stroke (HCC), and Tobacco user.   Significant Hospital Events: Including procedures, antibiotic start and stop dates in addition to other pertinent events   6/23 admit for necrotizing pancreatitis  Interim History / Subjective:  Hypertensive overnight treated with PRN IV medications  Still having diffuse abdominal pain Denies nausea/vomiting  Objective    Blood pressure (!) 166/104, pulse (!) 103, temperature 97.7 F (36.5 C), temperature source Axillary, resp. rate (!) 29, height 5' 7 (1.702 m), weight 55.1 kg, SpO2 97%.        Intake/Output Summary (Last 24 hours) at  04/20/2024 0715 Last data filed at 04/20/2024 0654 Gross per 24 hour  Intake 3784.07 ml  Output 250 ml  Net 3534.07 ml   Filed Weights   04/19/24 1301 04/19/24 1831 04/20/24 0323  Weight: 66.7 kg 56.4 kg 55.1 kg    Examination:  General: Thin adult male in NAD HENT: [place]/At, PERRL, no JVD Lungs: Clear bilateral breath sounds. Tachypnea. No accessory muscle use.  Cardiovascular: Tachy, regular, no MRG Abdomen: Soft, diffusely tender. Less guarding today.  Extremities: No acute deformity or edema.  Neuro: Sleeping, easily arouses to verbal stimuli and cooperates with exam.  GU: Foley.  Resolved problem list   Assessment and Plan   Acute necrotizing pancreatitis: Severe pancreatitis. History of alcoholic pancreatitis. Also takes Farxiga  which could possibly be a contributor.  - Bowel rest - IV fluid - LR @ 150 through this evening.  - Appreciate surgery and GI consults.  - Pain management with PRN dilaudid  - No role for antibiotics currently - Discuss early clear liquid diet vs enteral nutrition via NJ tube - Trend lipase, LFT, LDH - Shallow respirations d/t pain. Incentive spirometry  SMV - portosplenic thrombus: secondary to pancreatitis. Nonocclusive - heparin infusion per pharmacy  Alcohol abuse High risk of withdrawal. Last drink 6/21. - CIWA protocol - Scheduled ativan  low dose  - Thiamine , folate  Lactic acidosis: secondary to pancreatitis - trending down - repeat PRN  AKI Hyponatremia Hypokalemia - IVF - Trend chemistry  DM2 with hyperglycemia - Insulin  infusion, will likely transition off today - CBG monitoring - Holding home farxiga   Chronic HFpEF HTN - Start scheduled IV metoprolol  - PRN labetalol, hydralazine  - Holding home amlodipine , lopressor   Rheumatoid arthritis - Doesn't appear  as though he is on any home medications  Tobacco abuse - nicotine  patch  Best Practice (right click and Reselect all SmartList Selections daily)    Diet/type: NPO DVT prophylaxis systemic heparin Pressure ulcer(s): pressure ulcer assessment deferred  GI prophylaxis: PPI Lines: N/A Foley:  N/A Code Status:  full code Last date of multidisciplinary goals of care discussion [ ]    Deward Eastern, AGACNP-BC [facility]  See Amion for personal pager PCCM on call pager [phone] until 7pm. Please call Elink 7p-7a. [phone]  04/20/2024 7:15 AM

## 2024-04-20 NOTE — Progress Notes (Signed)
 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  Allergies  Allergen Reactions   Lisinopril  Other (See Comments)    angioedema   Citalopram Nausea Only   Morphine  Nausea And Vomiting   Nsaids Other (See Comments)    Stomach Irritability    Paroxetine Nausea And Vomiting    Patient Measurements: Height: 5' 7 (170.2 cm) Weight: 56.4 kg (124 lb 5.4 oz) IBW/kg (Calculated) : 66.1 HEPARIN DW (KG): 56.4  Vital Signs: Temp: 98.2 F (36.8 C) (06/24 0000) Temp Source: Oral (06/24 0000) BP: 144/88 (06/24 0100) Pulse Rate: 122 (06/24 0100)  Labs: Recent Labs    04/19/24 1331 04/19/24 1420 04/19/24 1532 04/19/24 1923 04/20/24 0143  HGB 20.6* 17.3*  --   --  20.3*  HCT 56.6* 51.0  --   --  56.7*  PLT 218  --   --   --  113*  APTT  --   --   --  >200*  --   LABPROT 14.0  --   --   --   --   INR 1.1  --   --   --   --   HEPARINUNFRC  --   --   --   --  0.79*  CREATININE  --  1.50* 1.48*  --  1.57*    Estimated Creatinine Clearance: 36.4 mL/min (A) (by C-G formula based on SCr of 1.57 mg/dL (H)).   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  CT scan shows thrombus extending from the SMV into the porta splenic confluence and proximal main portal  vein.  04/20/2024 HL 0.79 supra-therapeutic on 1100 units/hr Hgb 20.3, plts down 113 Per RN no bleeding noted   Goal of Therapy:  Heparin level 0.3-0.7 units/ml Monitor platelets by anticoagulation protocol: Yes   Plan:  Decrease heparin drip to 1050 units/hr Heparin level in 6 hours Monitor daily heparin level, CBC, signs/symptoms of bleeding   Thank you for allowing pharmacy to be a part of this patient's care.  Leeroy Mace RPh 04/20/2024, 3:16 AM

## 2024-04-20 NOTE — Consult Note (Signed)
 Referring Provider: Dr. Meade Primary Care Physician:  [name], NP Primary Gastroenterologist:  Sampson  Reason for Consultation:  Necrotizing Pancreatitis  HPI: [name] is a 68 y.o. male with alcohol abuse and history of pancreatitis admitted for 3 days of diffuse abdominal pain and CT shows pancreatitis with necrosis. History obtained from chart reviewed. Patient unable to give any history. Lipase 298, TB 2.4, ALP 58, AST 59, ALT 45. Nonocclusive SMV thrombus. On Plavix  and Xarelto .  Past Medical History:  Diagnosis Date   Alcohol abuse    Allergic rhinitis    CHF (congestive heart failure) (HCC)    Diabetes mellitus without complication (HCC)    'sometime last yr'--type 2   GERD (gastroesophageal reflux disease)    Hepatitis    he thinks its hep b or c   Hyperlipidemia    Hypertension    Osteoarthritis    Pancreatitis    Rheumatoid arthritis(714.0)    Stroke (HCC)    Tobacco user     Past Surgical History:  Procedure Laterality Date   COLONOSCOPY     FRACTURE SURGERY     cheek bone fracture    [facility]     right shoulder  -- 2010   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   Procedure: TOTAL HIP ARTHROPLASTY ANTERIOR APPROACH;  Surgeon: [name], MD;  Location: [facility];  Service: Orthopedics;  Laterality: Right;   TOTAL HIP ARTHROPLASTY Left 12/02/2023   Procedure: TOTAL HIP ARTHROPLASTY ANTERIOR APPROACH;  Surgeon: Chancy Evalene JONETTA, MD;  Location: [facility];  Service: Orthopedics;  Laterality: Left;   WRIST SURGERY     fusion with pins  2007    Prior to Admission medications   Medication Sig Start Date End Date Taking? Authorizing Provider  acetaminophen  (TYLENOL ) 500 MG tablet Take 2 tablets (1,000 mg total) by mouth every 6 (six) hours as needed for mild pain (pain score 1-3) or moderate pain (pain score 4-6). 12/04/23   Gawne, Meghan M, PA-C  amLODipine  (NORVASC ) 10 MG tablet Take 1 tablet (10 mg total) by mouth daily. 04/02/24   [name], NP   atorvastatin  (LIPITOR) 40 MG tablet Take 1 tablet (40 mg total) by mouth daily. Patient taking differently: Take 20 mg by mouth at bedtime. 06/20/22 12/02/23  [name], NP  cetirizine  (ZYRTEC ) 10 MG tablet Take 1 tablet (10 mg total) by mouth daily. 04/02/24   [name], NP  clopidogrel  (PLAVIX ) 75 MG tablet Take 1 tablet (75 mg total) by mouth daily. 04/02/24   [name], NP  dapagliflozin  propanediol (FARXIGA ) 10 MG TABS tablet Take 1 tablet (10 mg total) by mouth daily before breakfast. 04/02/24   Nichols, Tonya S, NP  lidocaine  (LIDODERM ) 5 % Place 1 patch onto the skin daily. Apply 1 patch to area of pain for up to 12 hours.  Then remove patch for full 12 hours before reapplying a new patch. 03/15/24   Veta Palma, PA-C  metFORMIN  (GLUCOPHAGE ) 500 MG tablet Take 1 tablet (500 mg total) by mouth daily with breakfast. 09/25/22 11/19/23  [name], NP  metoprolol  tartrate (LOPRESSOR ) 50 MG tablet Take 1 tablet (50 mg total) by mouth 2 (two) times daily. Take one two times a day for blood pressure. 04/02/24 04/02/25  [name], NP  ondansetron  (ZOFRAN -ODT) 4 MG disintegrating tablet Take 1 tablet (4 mg total) by mouth every 8 (eight) hours as needed for nausea or vomiting. 12/04/23   Gawne, Meghan  M, PA-C  rivaroxaban  (XARELTO ) 10 MG TABS tablet Take 1 tablet (10 mg total) by mouth daily. For 30 days to prevent blood clots after surgery 12/04/23   Gawne, Meghan M, PA-C  sennosides-docusate sodium  (SENOKOT-S) 8.6-50 MG tablet Take 2 tablets by mouth daily as needed for constipation (while taking narcotics). 12/04/23   Gawne, Meghan M, PA-C  tamsulosin  (FLOMAX ) 0.4 MG CAPS capsule Take 2 capsules (0.8 mg total) by mouth daily. Patient taking differently: Take 0.4 mg by mouth daily. 06/20/22   [name], NP  traZODone  (DESYREL ) 100 MG tablet Take 1 tablet (100 mg total) by mouth at bedtime. Take one table by mouth at bedtime as needed for sleep. 04/02/24 04/02/25  [name], NP    Scheduled Meds:  Chlorhexidine  Gluconate Cloth  6 each Topical Daily   folic acid   1 mg Intravenous Daily   LORazepam   1 mg Intravenous Q6H   metoprolol  tartrate  5 mg Intravenous Q8H   thiamine  (VITAMIN B1) injection  100 mg Intravenous Daily   Continuous Infusions:  heparin Stopped (04/20/24 0849)   insulin  Stopped (04/20/24 0849)   sodium phosphate  15 mmol in sodium chloride  0.9 % 250 mL infusion Stopped (04/20/24 0849)   PRN Meds:.dextrose , docusate sodium , hydrALAZINE , HYDROmorphone  (DILAUDID ) injection, labetalol, ondansetron  (ZOFRAN ) IV, polyethylene glycol  Allergies as of 04/19/2024 - Review Complete 04/19/2024  Allergen Reaction Noted   Lisinopril  Other (See Comments) 02/10/2017   Citalopram Nausea Only 10/31/2004   Morphine  Nausea And Vomiting 11/28/2014   Nsaids Other (See Comments) 12/18/2015   Paroxetine Nausea And Vomiting 07/18/2004    Family History  Problem Relation Age of Onset   Kidney disease Brother    Angioedema Maternal Aunt        Tongue swelling   Heart attack Mother    Heart attack Father     Social History   Socioeconomic History   Marital status: Single    Spouse name: Not on file   Number of children: Not on file   Years of education: Not on file   Highest education level: Not on file  Occupational History   Not on file  Tobacco Use   Smoking status: Every Day    Current packs/day: 0.40    Average packs/day: 0.4 packs/day for 25.0 years (10.0 ttl pk-yrs)    Types: Cigarettes    Passive exposure: Current   Smokeless tobacco: Never   Tobacco comments:    Trying to quit smoking.  Currently smoking 1 -2 cigarette per day and is doing OK with decrease.  Vaping Use   Vaping status: Never Used  Substance and Sexual Activity   Alcohol use: Yes    Alcohol/week: 7.0 - 14.0 standard drinks of alcohol    Types: 7 - 14 Cans of beer per week    Comment: 4 beer/day per family   Drug use: Not Currently    Comment: hx of 20 years  ago -cocaine  and marijuanan   Sexual activity: Yes  Other Topics Concern   Not on file  Social History Narrative   [name]. Married- 1 male sexual partner.   Denies drug use but has hx of crack cocaine, alcohol and tobacco use.   Social Drivers of Health   Financial Resource Strain: Low Risk  (07/10/2023)   Overall Financial Resource Strain (CARDIA)    Difficulty of Paying Living Expenses: Not very hard  Food Insecurity: Food Insecurity Present (12/02/2023)   Hunger Vital Sign    Worried  About Running Out of Food in the Last Year: Often true    Ran Out of Food in the Last Year: Often true  Transportation Needs: No Transportation Needs (12/02/2023)   PRAPARE - Administrator, Civil Service (Medical): No    Lack of Transportation (Non-Medical): No  Physical Activity: Sufficiently Active (07/10/2023)   Exercise Vital Sign    Days of Exercise per Week: 7 days    Minutes of Exercise per Session: 30 min  Stress: No Stress Concern Present (07/10/2023)   [employer] of Occupational Health - Occupational Stress Questionnaire    Feeling of Stress : Only a little  Social Connections: Moderately Isolated (12/02/2023)   Social Connection and Isolation Panel    Frequency of Communication with Friends and Family: More than three times a week    Frequency of Social Gatherings with Friends and Family: More than three times a week    Attends Religious Services: More than 4 times per year    Active Member of [employer] or Organizations: No    Attends Banker Meetings: Never    Marital Status: Never married  Intimate Partner Violence: Not At Risk (12/02/2023)   Humiliation, Afraid, Rape, and Kick questionnaire    Fear of Current or Ex-Partner: No    Emotionally Abused: No    Physically Abused: No    Sexually Abused: No    Review of Systems: All negative except as stated above in HPI.  Physical Exam: Vital signs: Vitals:   04/20/24 0800 04/20/24 0900  BP: (!) 155/90 (!)  170/102  Pulse: (!) 117 (!) 113  Resp: (!) 35 (!) 26  Temp: 98.4 F (36.9 C)   SpO2: 97% 96%   Last BM Date : 04/19/24 (PTA) General:   Somnolent, chronically ill-appearing, no acute distress, thin  Head: normocephalic, atraumatic Eyes: anicteric sclera Neck: supple, nontender Lungs:  Clear throughout to auscultation.   No wheezes, crackles, or rhonchi. No acute distress. Heart:  Regular rate and rhythm; no murmurs, clicks, rubs,  or gallops. Abdomen: diffuse tenderness with guarding (worse in epigastric), soft, nondistended, +BS Rectal:  Deferred Ext: no edema  GI:  Lab Results: Recent Labs    04/19/24 1331 04/19/24 1420 04/20/24 0143  WBC 22.6*  --  24.4*  HGB 20.6* 17.3* 20.3*  HCT 56.6* 51.0 56.7*  PLT 218  --  113*   BMET Recent Labs    04/19/24 1420 04/19/24 1532 04/20/24 0143  NA 127* 125* 130*  K 6.2* 3.1* 4.2  CL 99 93* 101  CO2  --  15* 17*  GLUCOSE 377* 348* 152*  BUN 18 13 21   CREATININE 1.50* 1.48* 1.57*  CALCIUM   --  7.7* 8.1*   LFT Recent Labs    04/20/24 0143  PROT 6.3*  ALBUMIN  2.8*  AST 59*  ALT 45*  ALKPHOS 58  BILITOT 2.4*   PT/INR Recent Labs    04/19/24 1331  LABPROT 14.0  INR 1.1     Studies/Results: CT ABDOMEN PELVIS W CONTRAST Result Date: 04/19/2024 CLINICAL DATA:  Abdominal pain. EXAM: CT ABDOMEN AND PELVIS WITH CONTRAST TECHNIQUE: Multidetector CT imaging of the abdomen and pelvis was performed using the standard protocol following bolus administration of intravenous contrast. RADIATION DOSE REDUCTION: This exam was performed according to the departmental dose-optimization program which includes automated exposure control, adjustment of the mA and/or kV according to patient size and/or use of iterative reconstruction technique. CONTRAST:  OMNIPAQUE  IOHEXOL  300 MG/ML  SOLN COMPARISON:  None Available. FINDINGS: Lower chest: Bibasilar subpleural atelectasis. No intra-abdominal free air.  Small ascites. Hepatobiliary:  Fatty liver. There is irregularity of the liver contour suggestive of early cirrhosis. No biliary dilatation. The gallbladder is distended. No calcified gallstone. Pancreas: Severe inflammatory changes of the pancreas consistent with acute pancreatitis. Areas of non enhancement involving the uncinate process as well as body and tail of the pancreas suggest areas of necrosis. Significant amount of inflammatory fluid adjacent to the pancreas. No organized fluid collection. Spleen: Normal in size without focal abnormality. Adrenals/Urinary Tract: The adrenal glands unremarkable. Small bilateral renal cysts. There is no hydronephrosis on either side. The visualized ureters appear unremarkable. The urinary bladder is poorly visualized due to streak artifact caused by bilateral hip arthroplasties. Stomach/Bowel: There is diverticulosis of the proximal colon. There is no bowel obstruction or active inflammation. The appendix is normal. Vascular/Lymphatic: Mild aortoiliac atherosclerotic disease. The IVC is unremarkable. There is thrombus extending from the SMV into the porta splenic confluence and proximal main portal vein. There is high-grade narrowing of the SMV close to the porta splenic confluence. The main portal vein remains patent. No portal venous gas. No adenopathy. Reproductive: The prostate gland is not visualized due to streak artifact. Other: None Musculoskeletal: Degenerative changes of the spine. Bilateral total hip arthroplasties. No acute osseous pathology. IMPRESSION: 1. 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 No abscess or pseudocyst. 2. 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 3. Fatty liver with early cirrhosis. 4. Colonic diverticulosis. No bowel obstruction. Normal appendix. 5.  Aortic Atherosclerosis (ICD10-I70.0). These results were called by telephone at the time of interpretation on 04/19/2024 at 3:36 pm to  Joen Paris, PA, who verbally acknowledged these results. Electronically Signed   By: Vanetta Chou M.D.   On: 04/19/2024 15:48   DG Chest Port 1 View Result Date: 04/19/2024 CLINICAL DATA:  Questionable sepsis - evaluate for abnormality. Nausea, vomiting, weakness EXAM: PORTABLE CHEST 1 VIEW COMPARISON:  05/14/2021 FINDINGS: The heart size and mediastinal contours are within normal limits. Both lungs are clear. The visualized skeletal structures are unremarkable. IMPRESSION: No active disease. Electronically Signed   By: Franky Crease M.D.   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 Hold antibiotics. Correct electrolytes. If no significant improvement in next 1-2 days, then will need nasojejunal tube for nutrition. Will follow.    LOS: 1 day   [name]  04/20/2024, 9:32 AM  Questions please call [phone]

## 2024-04-20 NOTE — Progress Notes (Signed)
 PHARMACY - ANTICOAGULATION CONSULT NOTE  Pharmacy Consult for IV Heparin Indication: SMV Thrombus  Allergies  Allergen Reactions   Lisinopril  Other (See Comments)    angioedema   Citalopram Nausea Only   Morphine  Nausea And Vomiting   Nsaids Other (See Comments)    Stomach Irritability    Paroxetine Nausea And Vomiting    Patient Measurements: Height: 5' 7 (170.2 cm) Weight: 55.1 kg (121 lb 7.6 oz) IBW/kg (Calculated) : 66.1 HEPARIN DW (KG): 56.4  Vital Signs: Temp: 98.4 F (36.9 C) (06/24 0800) Temp Source: Axillary (06/24 0800) BP: 166/86 (06/24 0700) Pulse Rate: 107 (06/24 0700)  Labs: Recent Labs    04/19/24 1331 04/19/24 1420 04/19/24 1532 04/19/24 1923 04/20/24 0143  HGB 20.6* 17.3*  --   --  20.3*  HCT 56.6* 51.0  --   --  56.7*  PLT 218  --   --   --  113*  APTT  --   --   --  >200*  --   LABPROT 14.0  --   --   --   --   INR 1.1  --   --   --   --   HEPARINUNFRC  --   --   --   --  0.79*  CREATININE  --  1.50* 1.48*  --  1.57*    Estimated Creatinine Clearance: 35.6 mL/min (A) (by C-G formula based on SCr of 1.57 mg/dL (H)).   Medical History: Past Medical History:  Diagnosis Date   Alcohol abuse    Allergic rhinitis    CHF (congestive heart failure) (HCC)    Diabetes mellitus without complication (HCC)    'sometime last yr'--type 2   GERD (gastroesophageal reflux disease)    Hepatitis    he thinks its hep b or c   Hyperlipidemia    Hypertension    Osteoarthritis    Pancreatitis    Rheumatoid arthritis(714.0)    Stroke (HCC)    Tobacco user     Medications:  Not on anticoagulants PTA -Previously prescribed Xarelto  x30 days in Feb 2025 s/p THA  Assessment: Pt is a 70 yoM with history of EtOH dependency, admitted with pancreatitis. CT revealed SMV thrombus - pharmacy consulted to dose heparin.   Today, 04/20/24 Heparin level = 0.97 supratherapeutic and increased despite decreasing rate of heparin to 1050 units/hr CBC = Hgb  elevated, Plt low and decreasing No complications reported, no signs of bleeding  Goal of Therapy:  Heparin level 0.3-0.7 units/ml Monitor platelets by anticoagulation protocol: Yes   Plan:  Decrease rate of heparin infusion to 900 units/hr Check heparin level 6-8 hours after rate change CBC, heparin level daily Monitor for signs of bleeding  Ronal CHRISTELLA Rav, PharmD 04/20/24 8:55 AM

## 2024-04-20 NOTE — Progress Notes (Signed)
 [facility] ADULT ICU REPLACEMENT PROTOCOL   The patient does apply for the [facility] Adult ICU Electrolyte Replacment Protocol based on the criteria listed below:   1.Exclusion criteria: TCTS, ECMO, Dialysis, and Myasthenia Gravis patients 2. Is GFR >/= 30 ml/min? Yes.    Patient's GFR today is 48 3. Is SCr </= 2? Yes.   Patient's SCr is 1.57 mg/dL 4. Did SCr increase >/= 0.5 in 24 hours? No. 5.Pt's weight >40kg  Yes.   6. Abnormal electrolyte(s): Phos 2.2   Mag 1.8  7. Electrolytes replaced per protocol 8.  Call MD STAT for K+ </= 2.5, Phos </= 1, or Mag </= 1 Physician:  Dr. Haze Rummer, [place] ORN 04/20/2024 3:56 AM

## 2024-04-20 NOTE — Progress Notes (Signed)
 PHARMACY - ANTICOAGULATION CONSULT NOTE  Pharmacy Consult for IV Heparin Indication: SMV Thrombus  Allergies  Allergen Reactions   Lisinopril  Swelling and Other (See Comments)    angioedema   Citalopram Nausea Only   Morphine  Nausea And Vomiting   Nsaids Other (See Comments)    Stomach Irritability    Paroxetine Nausea And Vomiting    Patient Measurements: Height: 5' 7 (170.2 cm) Weight: 55.1 kg (121 lb 7.6 oz) IBW/kg (Calculated) : 66.1 HEPARIN DW (KG): 56.4  Vital Signs: Temp: 98.6 F (37 C) (06/24 1950) Temp Source: Axillary (06/24 1950) BP: 174/74 (06/24 2117) Pulse Rate: 113 (06/24 2121)  Labs: Recent Labs    04/19/24 1331 04/19/24 1420 04/19/24 1532 04/19/24 1923 04/20/24 0143 04/20/24 1231 04/20/24 2049  HGB 20.6* 17.3*  --   --  20.3* 18.6*  --   HCT 56.6* 51.0  --   --  56.7* 51.4  --   PLT 218  --   --   --  113* 97*  --   APTT  --   --   --  >200*  --   --   --   LABPROT 14.0  --   --   --   --   --   --   INR 1.1  --   --   --   --   --   --   HEPARINUNFRC  --   --   --   --  0.79* 0.97* 0.62  CREATININE  --  1.50* 1.48*  --  1.57*  --   --     Estimated Creatinine Clearance: 35.6 mL/min (A) (by C-G formula based on SCr of 1.57 mg/dL (H)).  Medications:  Not on anticoagulants PTA -Previously prescribed Xarelto  x30 days in Feb 2025 s/p THA  Assessment: Pt is a 89 yoM with history of EtOH dependency, admitted with pancreatitis. CT revealed SMV thrombus - pharmacy consulted to dose heparin.   Today, 04/20/24 Heparin level = 0.62 therapeutic  after rate of heparin decreased to 900 units/hr CBC = Hgb elevated, Plt low and decreasing No complications reported, no signs of bleeding  Goal of Therapy:  Heparin level 0.3-0.7 units/ml Monitor platelets by anticoagulation protocol: Yes   Plan:  Continue heparin infusion @ 900 units/hr CBC, heparin level daily Monitor for signs of bleeding  Rosaline IVAR Edison, Pharm.D Use secure chat for  questions 04/20/2024 9:44 PM

## 2024-04-21 ENCOUNTER — Inpatient Hospital Stay (HOSPITAL_COMMUNITY)

## 2024-04-21 DIAGNOSIS — E1165 Type 2 diabetes mellitus with hyperglycemia: Secondary | ICD-10-CM | POA: Diagnosis not present

## 2024-04-21 DIAGNOSIS — N179 Acute kidney failure, unspecified: Secondary | ICD-10-CM | POA: Diagnosis not present

## 2024-04-21 DIAGNOSIS — F10931 Alcohol use, unspecified with withdrawal delirium: Secondary | ICD-10-CM | POA: Diagnosis not present

## 2024-04-21 DIAGNOSIS — E440 Moderate protein-calorie malnutrition: Secondary | ICD-10-CM | POA: Insufficient documentation

## 2024-04-21 DIAGNOSIS — K8521 Alcohol induced acute pancreatitis with uninfected necrosis: Secondary | ICD-10-CM | POA: Diagnosis not present

## 2024-04-21 LAB — COMPREHENSIVE METABOLIC PANEL WITH GFR
ALT: 28 U/L (ref 0–44)
AST: 40 U/L (ref 15–41)
Albumin: 2.8 g/dL — ABNORMAL LOW (ref 3.5–5.0)
Alkaline Phosphatase: 48 U/L (ref 38–126)
Anion gap: 13 (ref 5–15)
BUN: 24 mg/dL — ABNORMAL HIGH (ref 8–23)
CO2: 19 mmol/L — ABNORMAL LOW (ref 22–32)
Calcium: 8.4 mg/dL — ABNORMAL LOW (ref 8.9–10.3)
Chloride: 103 mmol/L (ref 98–111)
Creatinine, Ser: 1.11 mg/dL (ref 0.61–1.24)
GFR, Estimated: >60 mL/min (ref >60)
Glucose, Bld: 149 mg/dL — ABNORMAL HIGH (ref 70–99)
Potassium: 4 mmol/L (ref 3.5–5.1)
Sodium: 135 mmol/L (ref 135–145)
Total Bilirubin: 2.6 mg/dL — ABNORMAL HIGH (ref 0.0–1.2)
Total Protein: 6.2 g/dL — ABNORMAL LOW (ref 6.5–8.1)

## 2024-04-21 LAB — GLUCOSE, CAPILLARY
Glucose-Capillary: 132 mg/dL — ABNORMAL HIGH (ref 70–99)
Glucose-Capillary: 133 mg/dL — ABNORMAL HIGH (ref 70–99)
Glucose-Capillary: 154 mg/dL — ABNORMAL HIGH (ref 70–99)
Glucose-Capillary: 130 mg/dL — ABNORMAL HIGH (ref 70–99)
Glucose-Capillary: 117 mg/dL — ABNORMAL HIGH (ref 70–99)
Glucose-Capillary: 124 mg/dL — ABNORMAL HIGH (ref 70–99)
Glucose-Capillary: 135 mg/dL — ABNORMAL HIGH (ref 70–99)
Glucose-Capillary: 141 mg/dL — ABNORMAL HIGH (ref 70–99)
Glucose-Capillary: 132 mg/dL — ABNORMAL HIGH (ref 70–99)
Glucose-Capillary: 154 mg/dL — ABNORMAL HIGH (ref 70–99)
Glucose-Capillary: 181 mg/dL — ABNORMAL HIGH (ref 70–99)
Glucose-Capillary: 194 mg/dL — ABNORMAL HIGH (ref 70–99)

## 2024-04-21 LAB — HEPARIN LEVEL (UNFRACTIONATED): Heparin Unfractionated: 0.46 [IU]/mL (ref 0.30–0.70)

## 2024-04-21 LAB — LIPASE, BLOOD: Lipase: 125 U/L — ABNORMAL HIGH (ref 11–51)

## 2024-04-21 LAB — CBC
HCT: 46.5 % (ref 39.0–52.0)
Hemoglobin: 16.5 g/dL (ref 13.0–17.0)
MCH: 34 pg (ref 26.0–34.0)
MCHC: 35.5 g/dL (ref 30.0–36.0)
MCV: 95.7 fL (ref 80.0–100.0)
Platelets: 90 10*3/uL — ABNORMAL LOW (ref 150–400)
RBC: 4.86 MIL/uL (ref 4.22–5.81)
RDW: 13.7 % (ref 11.5–15.5)
WBC: 18.8 10*3/uL — ABNORMAL HIGH (ref 4.0–10.5)
nRBC: 0 % (ref 0.0–0.2)

## 2024-04-21 LAB — MAGNESIUM
Magnesium: 2.4 mg/dL (ref 1.7–2.4)
Magnesium: 2.3 mg/dL (ref 1.7–2.4)

## 2024-04-21 LAB — PHOSPHORUS
Phosphorus: 2.6 mg/dL (ref 2.5–4.6)
Phosphorus: 2.3 mg/dL — ABNORMAL LOW (ref 2.5–4.6)

## 2024-04-21 MED ORDER — INSULIN GLARGINE-YFGN 100 UNIT/ML ~~LOC~~ SOLN
8.0000 [IU] | Freq: Every day | SUBCUTANEOUS | Status: DC
  Administered 2024-04-21 – 2024-04-22 (×2): 8 [IU] via SUBCUTANEOUS
  Filled 2024-04-21 (×3): qty 0.08

## 2024-04-21 MED ORDER — AMLODIPINE BESYLATE 10 MG PO TABS
10.0000 mg | ORAL_TABLET | Freq: Every day | ORAL | Status: DC
  Administered 2024-04-21 – 2024-04-28 (×8): 10 mg via JEJUNOSTOMY
  Filled 2024-04-21 (×8): qty 1

## 2024-04-21 MED ORDER — OSMOLITE 1.5 CAL PO LIQD
1000.0000 mL | ORAL | Status: DC
  Administered 2024-04-21: 1000 mL
  Filled 2024-04-21: qty 1000

## 2024-04-21 MED ORDER — PROPOFOL 1000 MG/100ML IV EMUL
INTRAVENOUS | Status: AC
  Filled 2024-04-21: qty 100

## 2024-04-21 MED ORDER — PROPOFOL 10 MG/ML IV BOLUS
INTRAVENOUS | Status: AC
Start: 2024-04-21 — End: 2024-04-21
  Filled 2024-04-21: qty 20

## 2024-04-21 MED ORDER — INSULIN ASPART 100 UNIT/ML IJ SOLN
0.0000 [IU] | INTRAMUSCULAR | Status: DC
  Administered 2024-04-21: 2 [IU] via SUBCUTANEOUS
  Administered 2024-04-21: 1 [IU] via SUBCUTANEOUS
  Administered 2024-04-21 – 2024-04-22 (×4): 2 [IU] via SUBCUTANEOUS
  Administered 2024-04-22 (×3): 3 [IU] via SUBCUTANEOUS
  Administered 2024-04-23 (×2): 2 [IU] via SUBCUTANEOUS
  Administered 2024-04-23 (×2): 3 [IU] via SUBCUTANEOUS
  Administered 2024-04-23: 4 [IU] via SUBCUTANEOUS
  Administered 2024-04-23: 3 [IU] via SUBCUTANEOUS
  Administered 2024-04-23: 2 [IU] via SUBCUTANEOUS
  Administered 2024-04-24 (×2): 3 [IU] via SUBCUTANEOUS
  Administered 2024-04-24 (×2): 5 [IU] via SUBCUTANEOUS
  Administered 2024-04-24: 3 [IU] via SUBCUTANEOUS
  Administered 2024-04-25: 7 [IU] via SUBCUTANEOUS
  Administered 2024-04-25: 5 [IU] via SUBCUTANEOUS
  Administered 2024-04-25: 7 [IU] via SUBCUTANEOUS
  Administered 2024-04-25 (×2): 3 [IU] via SUBCUTANEOUS
  Administered 2024-04-25 – 2024-04-26 (×2): 5 [IU] via SUBCUTANEOUS
  Administered 2024-04-26: 3 [IU] via SUBCUTANEOUS

## 2024-04-21 MED ORDER — PROSOURCE TF20 ENFIT COMPATIBL EN LIQD
60.0000 mL | Freq: Every day | ENTERAL | Status: DC
  Administered 2024-04-21 – 2024-05-03 (×13): 60 mL
  Filled 2024-04-21 (×13): qty 60

## 2024-04-21 MED ORDER — FENTANYL CITRATE (PF) 100 MCG/2ML IJ SOLN
INTRAMUSCULAR | Status: AC
  Filled 2024-04-21: qty 2

## 2024-04-21 NOTE — Progress Notes (Signed)
 [facility] Progress Note     Subjective: CC:  Somnolent, arouses to loud voice Oriented to person, hospital for abdominal problems, and reports year as 2024  Afebrile, HR 90bpm sinus, hypertensive, some tachypnea overnight WBC 18 from from 24  UOP marginal  Objective: Vital signs in last 24 hours: Temp:  [97.7 F (36.5 C)-101 F (38.3 C)] 98.1 F (36.7 C) (06/25 0357) Pulse Rate:  [70-114] 96 (06/25 0656) Resp:  [22-34] 27 (06/25 0656) BP: (118-208)/(66-111) 191/79 (06/25 0655) SpO2:  [88 %-99 %] 96 % (06/25 0656) Weight:  [62.5 kg] 62.5 kg (06/25 0500) Last BM Date : 04/19/24 (PTA)  Intake/Output from previous day: 06/24 0701 - 06/25 0700 In: 2678.5 [I.V.:2601; IV Piggyback:77.6] Out: 400 [Urine:400] Intake/Output this shift: No intake/output data recorded.  PE: Gen:  somnolent, arouses to touch and loud voice Card:  RRR, no lower extremity edema  Pulm:  Normal effort ORA, coarse upper airway sounds  Abd: Soft, mild distention, globally tender, worse in epigastric and periumbilical region without peritonitis, no rigidity Skin: warm and dry, no rashes  Psych: A&Ox2 - person/hospital   Lab Results:  Recent Labs    04/20/24 1231 04/21/24 0334  WBC 20.2* 18.8*  HGB 18.6* 16.5  HCT 51.4 46.5  PLT 97* 90*   BMET Recent Labs    04/20/24 0143 04/21/24 0334  NA 130* 135  K 4.2 4.0  CL 101 103  CO2 17* 19*  GLUCOSE 152* 149*  BUN 21 24*  CREATININE 1.57* 1.11  CALCIUM  8.1* 8.4*   PT/INR Recent Labs    04/19/24 1331  LABPROT 14.0  INR 1.1   CMP     Component Value Date/Time   NA 135 04/21/2024 0334   NA 139 04/02/2024 1100   K 4.0 04/21/2024 0334   CL 103 04/21/2024 0334   CO2 19 (L) 04/21/2024 0334   GLUCOSE 149 (H) 04/21/2024 0334   BUN 24 (H) 04/21/2024 0334   BUN 6 (L) 04/02/2024 1100   CREATININE 1.11 04/21/2024 0334   CREATININE 0.81 02/12/2017 1003   CALCIUM  8.4 (L) 04/21/2024 0334   PROT 6.2 (L) 04/21/2024 0334   PROT  7.1 04/02/2024 1100   ALBUMIN  2.8 (L) 04/21/2024 0334   ALBUMIN  4.3 04/02/2024 1100   AST 40 04/21/2024 0334   ALT 28 04/21/2024 0334   ALKPHOS 48 04/21/2024 0334   BILITOT 2.6 (H) 04/21/2024 0334   BILITOT 0.5 04/02/2024 1100   GFRNONAA >60 04/21/2024 0334   GFRNONAA >89 02/12/2017 1003   GFRAA 94 09/28/2020 1511   GFRAA >89 02/12/2017 1003   Lipase     Component Value Date/Time   LIPASE 125 (H) 04/21/2024 0334       Studies/Results: CT ABDOMEN PELVIS W CONTRAST Result Date: 04/19/2024 CLINICAL DATA:  Abdominal pain. EXAM: CT ABDOMEN AND PELVIS WITH CONTRAST TECHNIQUE: Multidetector CT imaging of the abdomen and pelvis was performed using the standard protocol following bolus administration of intravenous contrast. RADIATION DOSE REDUCTION: This exam was performed according to the departmental dose-optimization program which includes automated exposure control, adjustment of the mA and/or kV according to patient size and/or use of iterative reconstruction technique. CONTRAST:  OMNIPAQUE  IOHEXOL  300 MG/ML  SOLN COMPARISON:  None Available. FINDINGS: Lower chest: Bibasilar subpleural atelectasis. No intra-abdominal free air.  Small ascites. Hepatobiliary: Fatty liver. There is irregularity of the liver contour suggestive of early cirrhosis. No biliary dilatation. The gallbladder is distended. No calcified gallstone. Pancreas: Severe inflammatory changes of the pancreas  consistent with acute pancreatitis. Areas of non enhancement involving the uncinate process as well as body and tail of the pancreas suggest areas of necrosis. Significant amount of inflammatory fluid adjacent to the pancreas. No organized fluid collection. Spleen: Normal in size without focal abnormality. Adrenals/Urinary Tract: The adrenal glands unremarkable. Small bilateral renal cysts. There is no hydronephrosis on either side. The visualized ureters appear unremarkable. The urinary bladder is poorly visualized due to  streak artifact caused by bilateral hip arthroplasties. Stomach/Bowel: There is diverticulosis of the proximal colon. There is no bowel obstruction or active inflammation. The appendix is normal. Vascular/Lymphatic: Mild aortoiliac atherosclerotic disease. The IVC is unremarkable. There is thrombus extending from the SMV into the porta splenic confluence and proximal main portal vein. There is high-grade narrowing of the SMV close to the porta splenic confluence. The main portal vein remains patent. No portal venous gas. No adenopathy. Reproductive: The prostate gland is not visualized due to streak artifact. Other: None Musculoskeletal: Degenerative changes of the spine. Bilateral total hip arthroplasties. No acute osseous pathology. IMPRESSION: 1. 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 No abscess or pseudocyst. 2. 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 3. Fatty liver with early cirrhosis. 4. Colonic diverticulosis. No bowel obstruction. Normal appendix. 5.  Aortic Atherosclerosis (ICD10-I70.0). These results were called by telephone at the time of interpretation on 04/19/2024 at 3:36 pm to Joen Paris, PA, who verbally acknowledged these results. Electronically Signed   By: Vanetta Chou M.D.   On: 04/19/2024 15:48   DG Chest Port 1 View Result Date: 04/19/2024 CLINICAL DATA:  Questionable sepsis - evaluate for abnormality. Nausea, vomiting, weakness EXAM: PORTABLE CHEST 1 VIEW COMPARISON:  05/14/2021 FINDINGS: The heart size and mediastinal contours are within normal limits. Both lungs are clear. The visualized skeletal structures are unremarkable. IMPRESSION: No active disease. Electronically Signed   By: Franky Crease M.D.   On: 04/19/2024 14:58    Anti-infectives: Anti-infectives (From admission, onward)    Start     Dose/Rate Route Frequency Ordered Stop   04/20/24 1800  meropenem (MERREM) 1 g in sodium  chloride 0.9 % 100 mL IVPB  Status:  Discontinued        1 g 200 mL/hr over 30 Minutes Intravenous Every 12 hours 04/19/24 1725 04/19/24 1751   04/19/24 1715  meropenem (MERREM) 1 g in sodium chloride  0.9 % 100 mL IVPB  Status:  Discontinued        1 g 200 mL/hr over 30 Minutes Intravenous Every 8 hours 04/19/24 1705 04/19/24 1725   04/19/24 1415  cefTRIAXone (ROCEPHIN) 2 g in sodium chloride  0.9 % 100 mL IVPB        2 g 200 mL/hr over 30 Minutes Intravenous Once 04/19/24 1404 04/19/24 1458   04/19/24 1415  metroNIDAZOLE (FLAGYL) IVPB 500 mg        500 mg 100 mL/hr over 60 Minutes Intravenous  Once 04/19/24 1404 04/19/24 1521   04/19/24 1345  vancomycin (VANCOREADY) IVPB 1500 mg/300 mL  Status:  Discontinued        1,500 mg 150 mL/hr over 120 Minutes Intravenous  Once 04/19/24 1333 04/19/24 1403   04/19/24 1330  ceFEPIme (MAXIPIME) 2 g in sodium chloride  0.9 % 100 mL IVPB  Status:  Discontinued        2 g 200 mL/hr over 30 Minutes Intravenous  Once 04/19/24 1326 04/19/24 1403   04/19/24 1330  metroNIDAZOLE (FLAGYL) IVPB  500 mg  Status:  Discontinued        500 mg 100 mL/hr over 60 Minutes Intravenous  Once 04/19/24 1326 04/19/24 1403   04/19/24 1330  vancomycin (VANCOCIN) IVPB 1000 mg/200 mL premix  Status:  Discontinued        1,000 mg 200 mL/hr over 60 Minutes Intravenous  Once 04/19/24 1326 04/19/24 1332        Assessment/Plan  68 yo male with severe acute pancreatitis with necrosis, presumably from EtOH use He has diffuse peripancreatic stranding with developing necrosis across the gland, but no walled-off collections, and no gas within the necrosis to suggest infection. Patient is very early in the course of pancreatitis and there is no indication for acute surgical intervention. - No indication for antibiotics in the absence of infected necrosis - Aggressive IV fluid hydration and supportive care - Patient will need nutritional support. I have ordered a cortrak (nasojejunal)  and consulted dietary for tube feeds - Surgery will follow   Non-occlusive SMV thrombus - secondary to above, on hep gtt   FEN: NPO, IVF ID: none VTE: hep gtt Foley: none at present, spont voids Dispo: ICU/CCM service   Other PMH: EtOH abuse - currently on scheduled ativan  AKI DM2 HFpEF HTN RA Tobacco abuse    LOS: 2 days   I reviewed nursing notes, hospitalist notes, last 24 h vitals and pain scores, last 48 h intake and output, last 24 h labs and trends, and last 24 h imaging results.  This care required straightforward level of medical decision making.   Almarie Pringle, PA-C [facility] Please see Amion for pager number during day hours 7:00am-4:30pm

## 2024-04-21 NOTE — Plan of Care (Signed)
  Problem: Clinical Measurements: Goal: Will remain free from infection Outcome: Progressing   Problem: Safety: Goal: Ability to remain free from injury will improve Outcome: Progressing   

## 2024-04-21 NOTE — Progress Notes (Signed)
 NTS patient down the right nare (small amount of bleeding noted) at this time, obtaining a small amount of whitish/clear secretions stimulating a strong cough with patient getting up a copious amount in his mouth as well. RN at bedside.

## 2024-04-21 NOTE — Progress Notes (Signed)
 PHARMACY - ANTICOAGULATION CONSULT NOTE  Pharmacy Consult for IV Heparin Indication: SMV Thrombus  Allergies  Allergen Reactions   Lisinopril  Swelling and Other (See Comments)    angioedema   Citalopram Nausea Only   Morphine  Nausea And Vomiting   Nsaids Other (See Comments)    Stomach Irritability    Paroxetine Nausea And Vomiting    Patient Measurements: Height: 5' 7 (170.2 cm) Weight: 55.1 kg (121 lb 7.6 oz) IBW/kg (Calculated) : 66.1 HEPARIN DW (KG): 56.4  Vital Signs: Temp: 98.1 F (36.7 C) (06/25 0357) Temp Source: Axillary (06/25 0357) BP: 181/84 (06/25 0341) Pulse Rate: 107 (06/25 0341)  Labs: Recent Labs    04/19/24 1331 04/19/24 1331 04/19/24 1420 04/19/24 1532 04/19/24 1923 04/20/24 0143 04/20/24 1231 04/20/24 2049 04/21/24 0334  HGB 20.6*  --    < >  --   --  20.3* 18.6*  --  16.5  HCT 56.6*  --    < >  --   --  56.7* 51.4  --  46.5  PLT 218  --   --   --   --  113* 97*  --  90*  APTT  --   --   --   --  >200*  --   --   --   --   LABPROT 14.0  --   --   --   --   --   --   --   --   INR 1.1  --   --   --   --   --   --   --   --   HEPARINUNFRC  --    < >  --   --   --  0.79* 0.97* 0.62 0.46  CREATININE  --   --    < > 1.48*  --  1.57*  --   --  1.11   < > = values in this interval not displayed.    Estimated Creatinine Clearance: 50.3 mL/min (by C-G formula based on SCr of 1.11 mg/dL).  Medications:  Not on anticoagulants PTA -Previously prescribed Xarelto  x30 days in Feb 2025 s/p THA  Assessment: Pt is a 70 yoM with history of EtOH dependency, admitted with pancreatitis. CT revealed SMV thrombus - pharmacy consulted to dose heparin.   Today, 04/21/24 Heparin level = 0.46 therapeutic  on 900 units/hr CBC = Hgb 16.5, Plt low and decreasing No bleeding per RN  Goal of Therapy:  Heparin level 0.3-0.7 units/ml Monitor platelets by anticoagulation protocol: Yes   Plan:  Continue heparin infusion @ 900 units/hr CBC, heparin level  daily Monitor for signs of bleeding  Leeroy Mace RPh 04/21/2024, 4:12 AM

## 2024-04-21 NOTE — Progress Notes (Signed)
 [facility] Gastroenterology Progress Note  [name] 68 y.o. Dec 05, 1955   Subjective: Somnolent  Objective: Vital signs: Vitals:   04/21/24 1500 04/21/24 1600  BP: (!) 162/75 (!) 181/75  Pulse: 98 100  Resp: (!) 25 (!) 29  Temp:  99.9 F (37.7 C)  SpO2: 96% 95%    Physical Exam: Gen: somnolent, thin, no acute distress  HEENT: anicteric sclera CV: RRR Chest: CTA B Abd: diffuse tenderness with guarding, soft, nondistended, +BS Ext: no edema  Lab Results: Recent Labs    04/20/24 0143 04/21/24 0334  NA 130* 135  K 4.2 4.0  CL 101 103  CO2 17* 19*  GLUCOSE 152* 149*  BUN 21 24*  CREATININE 1.57* 1.11  CALCIUM  8.1* 8.4*  MG 1.8 2.4  PHOS 2.2* 2.6   Recent Labs    04/20/24 0143 04/21/24 0334  AST 59* 40  ALT 45* 28  ALKPHOS 58 48  BILITOT 2.4* 2.6*  PROT 6.3* 6.2*  ALBUMIN  2.8* 2.8*   Recent Labs    04/20/24 1231 04/21/24 0334  WBC 20.2* 18.8*  HGB 18.6* 16.5  HCT 51.4 46.5  MCV 91.8 95.7  PLT 97* 90*      Assessment/Plan: 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 On Heparin drip for non-occlusive SMV thrombus. Supportive care. Will follow.   [name] 04/21/2024, 5:48 PM  Questions please call [phone] ID: [name], male   DOB: 1956-02-15, 68 y.o.   MRN: [id number]

## 2024-04-21 NOTE — Progress Notes (Signed)
 NAME:  [name], MRN:  [id number], DOB:  16-May-1956, LOS: 2 ADMISSION DATE:  04/19/2024, CONSULTATION DATE:  6/23 REFERRING MD:  Dr. Dreama , CHIEF COMPLAINT:  Pancreatitis   History of Present Illness:  68 year old male with PMH as below, which is significant for alcohol abuse, RA, pancreatitis, CHF, and DM. He was admitted in February of this year for a total hip arthoplasty for osteoarthritis. Drinks 10 beers per day. The perioperative course was uncomplicated. He now is presenting to [facility] ED 6/23 with complaints of abdominal pain for 3 days. Pain was diffuse, but worse over the right abdominal quadrants. CT of the abdomen demonstrated pancreatitis with necrosis. Lipase 283. CT also noted thrombus extending from the SMV into the portal splenic confluence. He was given IV fluids and started on antibiotics. He was also hypertensive and tachycardic, which was treated effectively with labetalol. GI was consulted by the emergency department and PCCM was asked to admit for severe pancreatitis.   Pertinent  Medical History   has a past medical history of Alcohol abuse, Allergic rhinitis, CHF (congestive heart failure) (HCC), Diabetes mellitus without complication (HCC), GERD (gastroesophageal reflux disease), Hepatitis, Hyperlipidemia, Hypertension, Osteoarthritis, Pancreatitis, Rheumatoid arthritis(714.0), Stroke (HCC), and Tobacco user.  Significant Hospital Events: Including procedures, antibiotic start and stop dates in addition to other pertinent events   6/23 admit for necrotizing pancreatitis 6/24 Hypertensive overnight treated with PRN IV medications, Still having diffuse abdominal pain 6/25 Intermittent episode of confusion/combativeness overnight   Interim History / Subjective:  Sleepy but arousable this am   Objective    Blood pressure (!) 191/79, pulse 96, temperature 98.1 F (36.7 C), temperature source Axillary, resp. rate (!) 27, height 5' 7 (1.702 m), weight 62.5  kg, SpO2 96%.        Intake/Output Summary (Last 24 hours) at 04/21/2024 0856 Last data filed at 04/21/2024 9364 Gross per 24 hour  Intake 2475.84 ml  Output 400 ml  Net 2075.84 ml   Filed Weights   04/19/24 1831 04/20/24 0323 04/21/24 0500  Weight: 56.4 kg 55.1 kg 62.5 kg    Examination:  General: Acute on chronically ill appearing middle aged male lying in bed on mechanical ventilation, in NAD HEENT: ETT, MM pink/moist, PERRL,  Neuro: Sleepy but arouasble, can follow simple commands  CV: s1s2 regular rate and rhythm, no murmur, rubs, or gallops,  PULM:  Bilateral rhonchi improved when asked to cough, on 2L [place] GI: soft, bowel sounds active in all 4 quadrants, non-tender, non-distended Extremities: warm/dry, no edema  Skin: no rashes or lesions  Resolved problem list   Assessment and Plan   Acute necrotizing pancreatitis -Severe pancreatitis. History of alcoholic pancreatitis. Also takes Farxiga  which could possibly be a contributor.  P: Surgery following, appreciate assistance  Place small bore NG tube for enteral feeds  Continue IV fluids  Multimodal pain control  Trend lipase and LFTs  Acute hypoxic respiratory failure  -Secondary to sedation in the setting of ETOH withdrawal  P: Close monitoring of airway in the ICU  Continue supplemental oxygen for stat goal greater than 94% Head of bed elevated 30 degrees Minimize sedation as able  Ensure adequate pulmonary hygiene  Aspiration precautions   SMV - portosplenic thrombus -Secondary to pancreatitis. Nonocclusive P: Continue Heparin drip   Alcohol abuse -High risk of withdrawal. Last drink 6/21 P: CIWA protocol  Supplement thiamine , folate, and multivitamin  Seizure precautions  Scheduled and as needed Benzodiazepine   Lactic acidosis -Secondary  to pancreatitis - trending down P: Repeat as needed   AKI - improving  Hyponatremia Hypokalemia P: Follow renal function  Monitor urine output Trend  Bmet Avoid nephrotoxins Ensure adequate renal perfusion  IV hydration  DM2 with hyperglycemia P: Transition off insuline drip  CBG checks 4  CBG goal 140-180 Home Farxiga  remains on hold   Chronic HFpEF HTN P: Continue scheduled IV metoprolol  Continuous telemetry  As needed IV antihypertensives  Hold home amlodipine  and lopressor , resume once NG tube in place  Optimize electrolytes   Rheumatoid arthritis -Doesn't appear as though he is on any home medications P: Supportive care   Tobacco abuse P: Nicotine  patch   Best Practice (right click and Reselect all SmartList Selections daily)   Diet/type: NPO DVT prophylaxis systemic heparin Pressure ulcer(s): pressure ulcer assessment deferred  GI prophylaxis: PPI Lines: N/A Foley:  N/A Code Status:  full code Last date of multidisciplinary goals of care discussion [ ]   CRITICAL CARE Performed by: Tasheem Elms D. Harris   Total critical care time: 38 minutes  Critical care time was exclusive of separately billable procedures and treating other patients.  Critical care was necessary to treat or prevent imminent or life-threatening deterioration.  Critical care was time spent personally by me on the following activities: development of treatment plan with patient and/or surrogate as well as nursing, discussions with consultants, evaluation of patient's response to treatment, examination of patient, obtaining history from patient or surrogate, ordering and performing treatments and interventions, ordering and review of laboratory studies, ordering and review of radiographic studies, pulse oximetry and re-evaluation of patient's condition.  Desera Graffeo D. Harris, NP-C [facility] Personal contact information can be found on Amion  If no contact or response made please call [phone] 04/21/2024, 8:59 AM

## 2024-04-21 NOTE — Progress Notes (Signed)
 Initial Nutrition Assessment  DOCUMENTATION CODES:   Non-severe (moderate) malnutrition in context of chronic illness  INTERVENTION:  - Once medically appropriate, recommend initiating tube feeding via Cortrak (tip in 4th portion of duodenum): Osmolite 1.5 at 55 ml/h (1320 ml per day) *Start at 73mL/hr and advance by 10mL Q12H Prosource TF20 60 ml daily Provides 2060 kcal, 102 gm protein, 1006 ml free water daily  - Monitor magnesium , potassium, and phosphorus BID for at least 3 days, MD to replete as needed, as pt is at risk for refeeding syndrome given ,malnutrition with unknown oral intake PTA and suspected weight loss.   - FWF per CCM. Would recommend 150mL Q4H.  - Continue 1mg  folic acid , 100mg  thiamine , and Multivitamin with minerals daily.  - Monitor weight trends.  - Will monitor for diet advancement.   NUTRITION DIAGNOSIS:   Moderate Malnutrition related to chronic illness (alcohol abuse; CHF) as evidenced by mild fat depletion, moderate muscle depletion.  GOAL:   Patient will meet greater than or equal to 90% of their needs  MONITOR:   Diet advancement, Labs, Weight trends, TF tolerance  REASON FOR ASSESSMENT:   Consult Enteral/tube feeding initiation and management  ASSESSMENT:   68 y.o. male with PMH alcohol abuse, CHF, DM2, pancreatitis, and RA who presented with abdominal pain and admitted for severe acute necrotizing alcoholic pancreatitis.  Patient in bed at time of visit. Sleepy and noted to be somewhat confused. Did not respond to any questions.  Per chart review, weight trended up from September 2024 to January 2025. It then appeared stable until patient lost from 156# in 2/4 to 147# by 6/6. This is a 9# or 6% weight loss in 4 months, which is not significant.  Patient weighed this admission at 124# -> 121# -> 137# today. Unable to verify accurate current weight. Suspect patient has lost further weight but unable to verify accurate weight loss at  this time.   Patient currently NPO and Surgery placed order for post-pyloric Cortrak today. Cortrak placed this AM and xray read came back showing tip in second portio of the dudoenum. Per discussion with Surgery, tube needs advanced to jejunum to help not stimulate pancreas with feeds.   Initial Cortrak stylet was accidentally thrown away after placement. RD present for second Cortrak tube placement and tube now xray verified in the fourth portion of the duodenum.   Will need to confirm if okay to feed with tip in current position.  Tube feed recs as above in interventions. Suspect patient at risk for refeeding syndrome so will plan for slow advancement.    Medications reviewed and include: 1mg  folic acid , MIV, 100mg  thiamine   Labs reviewed:  - HA1C 6.1 (as of 6/6) Blood Glucose 117-212 x24 hours   NUTRITION - FOCUSED PHYSICAL EXAM:  Flowsheet Row Most Recent Value  Orbital Region Mild depletion  Upper Arm Region Unable to assess  Thoracic and Lumbar Region Unable to assess  Buccal Region Mild depletion  Temple Region Moderate depletion  Clavicle Bone Region Moderate depletion  Clavicle and Acromion Bone Region Mild depletion  Scapular Bone Region Unable to assess  Dorsal Hand Mild depletion  Patellar Region Moderate depletion  Anterior Thigh Region Moderate depletion  Posterior Calf Region Severe depletion  Edema (RD Assessment) None  Hair Reviewed  Eyes Unable to assess  Mouth Unable to assess  Skin Reviewed  Nails Reviewed    Diet Order:   Diet Order  Diet NPO time specified  Diet effective now                   EDUCATION NEEDS:  Not appropriate for education at this time  Skin:  Skin Assessment: Reviewed RN Assessment  Last BM:  6/23  Height:  Ht Readings from Last 1 Encounters:  04/19/24 5' 7 (1.702 m)   Weight:  Wt Readings from Last 1 Encounters:  04/21/24 62.5 kg    BMI:  Body mass index is 21.58 kg/m.  Estimated  Nutritional Needs:  Kcal:  1900-2050 kcals Protein:  90-100 grams Fluid:  >/= 1.9L    Trude Ned RD, LDN Contact via Secure Chat.

## 2024-04-21 NOTE — TOC Initial Note (Signed)
 Transition of Care [facility]) - Initial/Assessment Note    Patient Details  Name: [name] MRN: [id number] Date of Birth: 1956/10/14  Transition of Care [facility]) CM/SW Contact:    Jon ONEIDA Anon, RN Phone Number: 04/21/2024, 3:25 PM  Clinical Narrative:                 Pt is from home. Pt brother Erskine Steinfeldt [phone] listed as POC. TOC consulted for substance abuse counseling. Resources added to AVS. There are no SDOH risks identified. TOC following for any new recommendations or needs.  Expected Discharge Plan: Home/Self Care Barriers to Discharge: Continued Medical Work up   Patient Goals and CMS Choice Patient states their goals for this hospitalization and ongoing recovery are:: Home CMS Medicare.gov Compare Post Acute Care list provided to:: Other (Comment Required) (NA) Choice offered to / list presented to : NA  ownership interest in [facility].provided to:: Parent NA    Expected Discharge Plan and Services In-house Referral: Clinical Social Work Discharge Planning Services: CM Consult Post Acute Care Choice: NA Living arrangements for the past 2 months: Single Family Home                 DME Arranged: N/A DME Agency: NA       HH Arranged: NA HH Agency: NA        Prior Living Arrangements/Services Living arrangements for the past 2 months: Single Family Home Lives with:: Relatives Patient language and need for interpreter reviewed:: Yes Do you feel safe going back to the place where you live?: Yes      Need for Family Participation in Patient Care: No (Comment) Care giver support system in place?: No (comment) Current home services: Other (comment) (NA) Criminal Activity/Legal Involvement Pertinent to Current Situation/Hospitalization: No - Comment as needed  Activities of Daily Living      Permission Sought/Granted Permission sought to share information with : Family Supports Permission granted to share information with :  Yes, Verbal Permission Granted  Share Information with NAME: Josede, Cicero (Brother)  [phone]           Emotional Assessment Appearance:: Appears stated age Attitude/Demeanor/Rapport: Unable to Assess Affect (typically observed): Unable to Assess Orientation: : Oriented to Self Alcohol / Substance Use: Alcohol Use Psych Involvement: No (comment)  Admission diagnosis:  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] Lactic acidosis [E87.20] Necrotizing pancreatitis [K85.91] Pancreatitis, alcoholic, acute [K85.20] Abdominal pain, unspecified abdominal location [R10.9] Patient Active Problem List   Diagnosis Date Noted   Pancreatitis, alcoholic, acute 04/19/2024   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/2023   Preop examination 10/28/2023   Osteoarthritis of left hip 10/28/2023   Hyperlipidemia LDL goal <55 10/28/2023   Alcoholic intoxication without complication (HCC) 05/16/2021   Marijuana use 05/16/2021   CVA (cerebral vascular accident) (HCC) 05/14/2021   Alcohol abuse 05/14/2021   Tobacco abuse 05/14/2021   Type 2 diabetes mellitus with complication, without long-term current use of insulin  (HCC) 05/14/2021   Angioedema 02/09/2017   Primary localized osteoarthritis of right hip 01/02/2016   Groin rash 06/16/2013   DM w/o complication type II (HCC) 06/16/2013   BPH (benign prostatic hyperplasia) 11/24/2012   Healthcare maintenance 11/24/2012   Pancreatitis 10/31/2012   Chronic pain syndrome 09/18/2011   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/19/2011   Right rotator cuff tear 07/26/2011   Shoulder pain, right 05/03/2011   Right ankle pain 01/23/2011   Left wrist pain 01/23/2011   Back pain 01/02/2011   MOLLUSCUM CONTAGIOSUM 12/05/2008  LEUKOCYTOSIS UNSPECIFIED 03/25/2008   LIVER FUNCTION TESTS, ABNORMAL 03/25/2008   ERECTILE DYSFUNCTION 05/12/2007   Essential hypertension 03/02/2007   Hypercholesteremia 01/08/2007   ANXIETY 01/08/2007   GERD 01/08/2007   Rheumatoid arthritis (HCC) 01/08/2007    OSTEOARTHRITIS 01/08/2007   PCP:  [name], NP Pharmacy:   [phone], [place] - [address] [place] [address] Phone: [phone] Fax: [phone]  [facility] - [place], [place] - [address] [place] [address] Phone: [phone] Fax: [phone]  DARRYLE LONG - [facility] [address] [place] [address] Phone: [phone] Fax: [phone]     Social Drivers of Health (SDOH) Social History: SDOH Screenings   Food Insecurity: Patient Unable To Answer (04/20/2024)  Housing: Patient Unable To Answer (04/20/2024)  Transportation Needs: Patient Unable To Answer (04/20/2024)  Utilities: Patient Unable To Answer (04/20/2024)  Alcohol Screen: Low Risk  (07/10/2023)  Depression (PHQ2-9): High Risk (04/02/2024)  Financial Resource Strain: Low Risk  (07/10/2023)  Physical Activity: Sufficiently Active (07/10/2023)  Social Connections: Patient Unable To Answer (04/20/2024)  Stress: No Stress Concern Present (07/10/2023)  Tobacco Use: High Risk (04/19/2024)  Health Literacy: Adequate Health Literacy (07/10/2023)   SDOH Interventions:     Readmission Risk Interventions    04/21/2024   11:05 AM  Readmission Risk Prevention Plan  Transportation Screening Complete  PCP or Specialist Appt within 3-5 Days Complete  HRI or Home Care Consult Complete  Social Work Consult for Recovery Care Planning/Counseling Complete  Palliative Care Screening Not Applicable  Medication Review Oceanographer) Complete

## 2024-04-22 ENCOUNTER — Inpatient Hospital Stay (HOSPITAL_COMMUNITY)

## 2024-04-22 DIAGNOSIS — N179 Acute kidney failure, unspecified: Secondary | ICD-10-CM | POA: Diagnosis not present

## 2024-04-22 DIAGNOSIS — F10931 Alcohol use, unspecified with withdrawal delirium: Secondary | ICD-10-CM | POA: Diagnosis not present

## 2024-04-22 DIAGNOSIS — K8521 Alcohol induced acute pancreatitis with uninfected necrosis: Secondary | ICD-10-CM | POA: Diagnosis not present

## 2024-04-22 DIAGNOSIS — E1165 Type 2 diabetes mellitus with hyperglycemia: Secondary | ICD-10-CM | POA: Diagnosis not present

## 2024-04-22 LAB — BASIC METABOLIC PANEL WITH GFR
Anion gap: 9 (ref 5–15)
BUN: 17 mg/dL (ref 8–23)
CO2: 23 mmol/L (ref 22–32)
Calcium: 8.5 mg/dL — ABNORMAL LOW (ref 8.9–10.3)
Chloride: 104 mmol/L (ref 98–111)
Creatinine, Ser: 0.82 mg/dL (ref 0.61–1.24)
GFR, Estimated: >60 mL/min (ref >60)
Glucose, Bld: 239 mg/dL — ABNORMAL HIGH (ref 70–99)
Potassium: 3.9 mmol/L (ref 3.5–5.1)
Sodium: 136 mmol/L (ref 135–145)

## 2024-04-22 LAB — CBC
HCT: 37.4 % — ABNORMAL LOW (ref 39.0–52.0)
Hemoglobin: 12.7 g/dL — ABNORMAL LOW (ref 13.0–17.0)
MCH: 33.4 pg (ref 26.0–34.0)
MCHC: 34 g/dL (ref 30.0–36.0)
MCV: 98.4 fL (ref 80.0–100.0)
Platelets: 82 10*3/uL — ABNORMAL LOW (ref 150–400)
RBC: 3.8 MIL/uL — ABNORMAL LOW (ref 4.22–5.81)
RDW: 14.2 % (ref 11.5–15.5)
WBC: 14.8 10*3/uL — ABNORMAL HIGH (ref 4.0–10.5)
nRBC: 0 % (ref 0.0–0.2)

## 2024-04-22 LAB — HEPARIN LEVEL (UNFRACTIONATED)
Heparin Unfractionated: 0.15 [IU]/mL — ABNORMAL LOW (ref 0.30–0.70)
Heparin Unfractionated: 0.19 [IU]/mL — ABNORMAL LOW (ref 0.30–0.70)
Heparin Unfractionated: 0.32 [IU]/mL (ref 0.30–0.70)

## 2024-04-22 LAB — GLUCOSE, CAPILLARY
Glucose-Capillary: 176 mg/dL — ABNORMAL HIGH (ref 70–99)
Glucose-Capillary: 220 mg/dL — ABNORMAL HIGH (ref 70–99)
Glucose-Capillary: 193 mg/dL — ABNORMAL HIGH (ref 70–99)
Glucose-Capillary: 223 mg/dL — ABNORMAL HIGH (ref 70–99)
Glucose-Capillary: 203 mg/dL — ABNORMAL HIGH (ref 70–99)

## 2024-04-22 LAB — MAGNESIUM
Magnesium: 2.4 mg/dL (ref 1.7–2.4)
Magnesium: 2.2 mg/dL (ref 1.7–2.4)

## 2024-04-22 LAB — COMPREHENSIVE METABOLIC PANEL WITH GFR
ALT: 29 U/L (ref 0–44)
AST: 55 U/L — ABNORMAL HIGH (ref 15–41)
Albumin: 2.4 g/dL — ABNORMAL LOW (ref 3.5–5.0)
Alkaline Phosphatase: 51 U/L (ref 38–126)
Anion gap: 10 (ref 5–15)
BUN: 20 mg/dL (ref 8–23)
CO2: 23 mmol/L (ref 22–32)
Calcium: 8.3 mg/dL — ABNORMAL LOW (ref 8.9–10.3)
Chloride: 103 mmol/L (ref 98–111)
Creatinine, Ser: 0.76 mg/dL (ref 0.61–1.24)
GFR, Estimated: >60 mL/min (ref >60)
Glucose, Bld: 202 mg/dL — ABNORMAL HIGH (ref 70–99)
Potassium: 3.1 mmol/L — ABNORMAL LOW (ref 3.5–5.1)
Sodium: 136 mmol/L (ref 135–145)
Total Bilirubin: 2.4 mg/dL — ABNORMAL HIGH (ref 0.0–1.2)
Total Protein: 6.2 g/dL — ABNORMAL LOW (ref 6.5–8.1)

## 2024-04-22 LAB — PHOSPHORUS
Phosphorus: 1.7 mg/dL — ABNORMAL LOW (ref 2.5–4.6)
Phosphorus: 1.6 mg/dL — ABNORMAL LOW (ref 2.5–4.6)

## 2024-04-22 MED ORDER — POLYETHYLENE GLYCOL 3350 17 G PO PACK
17.0000 g | PACK | Freq: Every day | ORAL | Status: DC | PRN
Start: 2024-04-22 — End: 2024-05-06

## 2024-04-22 MED ORDER — CARVEDILOL 12.5 MG PO TABS
12.5000 mg | ORAL_TABLET | Freq: Two times a day (BID) | ORAL | Status: DC
  Administered 2024-04-22 – 2024-04-29 (×15): 12.5 mg
  Filled 2024-04-22 (×15): qty 1

## 2024-04-22 MED ORDER — THIAMINE MONONITRATE 100 MG PO TABS
100.0000 mg | ORAL_TABLET | Freq: Every day | ORAL | Status: DC
  Administered 2024-04-23 – 2024-04-26 (×4): 100 mg
  Filled 2024-04-22 (×5): qty 1

## 2024-04-22 MED ORDER — HEPARIN BOLUS VIA INFUSION
2000.0000 [IU] | Freq: Once | INTRAVENOUS | Status: AC
  Administered 2024-04-22: 2000 [IU] via INTRAVENOUS
  Filled 2024-04-22: qty 2000

## 2024-04-22 MED ORDER — POTASSIUM CHLORIDE 10 MEQ/100ML IV SOLN
10.0000 meq | INTRAVENOUS | Status: AC
  Administered 2024-04-22 (×6): 10 meq via INTRAVENOUS
  Filled 2024-04-22 (×6): qty 100

## 2024-04-22 MED ORDER — ADULT MULTIVITAMIN LIQUID CH
15.0000 mL | Freq: Every day | ORAL | Status: DC
  Administered 2024-04-23 – 2024-05-06 (×14): 15 mL
  Filled 2024-04-22 (×14): qty 15

## 2024-04-22 MED ORDER — DOCUSATE SODIUM 50 MG/5ML PO LIQD: 100.0000 mg | Freq: Two times a day (BID) | ORAL | Status: DC | PRN

## 2024-04-22 MED ORDER — SODIUM PHOSPHATES 45 MMOLE/15ML IV SOLN
30.0000 mmol | Freq: Once | INTRAVENOUS | Status: AC
  Administered 2024-04-22: 30 mmol via INTRAVENOUS
  Filled 2024-04-22: qty 10

## 2024-04-22 MED ORDER — OSMOLITE 1.5 CAL PO LIQD
1000.0000 mL | ORAL | Status: DC
  Administered 2024-04-23 – 2024-05-02 (×11): 1000 mL
  Filled 2024-04-22 (×15): qty 1000

## 2024-04-22 MED ORDER — POTASSIUM PHOSPHATES 15 MMOLE/5ML IV SOLN
15.0000 mmol | Freq: Once | INTRAVENOUS | Status: AC
  Administered 2024-04-22: 15 mmol via INTRAVENOUS
  Filled 2024-04-22: qty 5

## 2024-04-22 MED ORDER — FUROSEMIDE 10 MG/ML IJ SOLN
40.0000 mg | Freq: Once | INTRAMUSCULAR | Status: AC
  Administered 2024-04-22: 40 mg via INTRAVENOUS
  Filled 2024-04-22: qty 4

## 2024-04-22 MED ORDER — DEXMEDETOMIDINE HCL IN NACL 200 MCG/50ML IV SOLN
0.0000 ug/kg/h | INTRAVENOUS | Status: DC
  Administered 2024-04-22: 0.4 ug/kg/h via INTRAVENOUS
  Administered 2024-04-23: 0.3 ug/kg/h via INTRAVENOUS
  Filled 2024-04-22 (×2): qty 50

## 2024-04-22 MED ORDER — THIAMINE HCL 100 MG/ML IJ SOLN
100.0000 mg | Freq: Every day | INTRAMUSCULAR | Status: DC
  Administered 2024-04-27 – 2024-04-28 (×2): 100 mg via INTRAVENOUS
  Filled 2024-04-22 (×2): qty 2

## 2024-04-22 NOTE — Progress Notes (Signed)
 NTS patient at this time down the right nare obtaining small, thick white secretions with slight bleeding from catheter passing. Vitals stable. RN at bedside.

## 2024-04-22 NOTE — Inpatient Diabetes Management (Signed)
 Inpatient Diabetes Program Recommendations  AACE/ADA: New Consensus Statement on Inpatient Glycemic Control (2015)  Target Ranges:  Prepandial:   less than 140 mg/dL      Peak postprandial:   less than 180 mg/dL (1-2 hours)      Critically ill patients:  140 - 180 mg/dL   Lab Results  Component Value Date   GLUCAP 220 (H) 04/22/2024   HGBA1C 6.1 (H) 04/02/2024    Review of Glycemic Control  Latest Reference Range & Units 04/21/24 11:56 04/21/24 15:30 04/21/24 19:53 04/21/24 23:14 04/22/24 03:05 04/22/24 07:42  Glucose-Capillary 70 - 99 mg/dL 867 (H) 845 (H) 818 (H) 194 (H) 176 (H) 220 (H)  (H): Data is abnormally high Diabetes history: Type 2 DM Outpatient Diabetes medications: Farxiga  10 mg every day (NT)?, Metformin  500 mg every day (NT) Current orders for Inpatient glycemic control: Semglee 8 units every day, Novolog  0-9 units Q4H Osmolite @ 20 ml/hr  Inpatient Diabetes Program Recommendations:    Consider further increasing Semglee to 12 units every day and as tube feeds advance consider also adding Novolog  2 units Q4H for tube feed coverage (to be stopped or held in the event tube feeds stopped).   Thanks, Tinnie Minus, MSN, RNC-OB Diabetes Coordinator [phone] (8a-5p)

## 2024-04-22 NOTE — Plan of Care (Signed)
   Problem: Elimination: Goal: Will not experience complications related to urinary retention Outcome: Progressing   Problem: Pain Managment: Goal: General experience of comfort will improve and/or be controlled Outcome: Progressing   Problem: Safety: Goal: Ability to remain free from injury will improve Outcome: Progressing

## 2024-04-22 NOTE — Progress Notes (Signed)
 eLink Physician-Brief Progress Note Patient Name: [name] DOB: [birth year] MRN: [id number]   Date of Service  04/22/2024  HPI/Events of Note  CXR with bi-basilar atelectasis and pleural effusions.  eICU Interventions  Will order incentive spirometer and see if it helps.        [name] U [name] 04/22/2024, 11:42 PM

## 2024-04-22 NOTE — Progress Notes (Signed)
 [facility] Gastroenterology Progress Note  [name] 68 y.o. 01-28-1956   Subjective: Somnolent.  Opens eyes to command.  Nasoenteric tube noted.  Objective: Vital signs: Vitals:   04/22/24 0722 04/22/24 0803  BP:  (!) 201/82  Pulse:  (!) 102  Resp: (!) 32   Temp: (P) 98.2 F (36.8 C)   SpO2:      Physical Exam: Gen: Somnolent, chronically ill-appearing, thin, no acute distress  HEENT: anicteric sclera CV: RRR Chest: CTA B Abd: Upper quadrant tenderness with guarding, soft, nondistended, positive bowel sounds Ext: no edema  Lab Results: Recent Labs    04/21/24 0334 04/21/24 1823 04/22/24 0444  NA 135  --  136  K 4.0  --  3.1*  CL 103  --  103  CO2 19*  --  23  GLUCOSE 149*  --  202*  BUN 24*  --  20  CREATININE 1.11  --  0.76  CALCIUM  8.4*  --  8.3*  MG 2.4 2.3 2.4  PHOS 2.6 2.3* 1.7*   Recent Labs    04/21/24 0334 04/22/24 0444  AST 40 55*  ALT 28 29  ALKPHOS 48 51  BILITOT 2.6* 2.4*  PROT 6.2* 6.2*  ALBUMIN  2.8* 2.4*   Recent Labs    04/21/24 0334 04/22/24 0444  WBC 18.8* 14.8*  HGB 16.5 12.7*  HCT 46.5 37.4*  MCV 95.7 98.4  PLT 90* 82*      Assessment/Plan: Necrotizing pancreatitis on nasal enteric feeding with tip of tube in fourth portion of duodenum.  On heparin drip for nonocclusive SMV thrombosis.  Continue supportive care.  No new GI recs.  [facility] will sign off.  Call if questions.   [name] 04/22/2024, 10:01 AM  Questions please call [phone] ID: [name], male   DOB: [birth year], 68 y.o.   MRN: [id number]

## 2024-04-22 NOTE — Progress Notes (Signed)
 Progressive encephalopathy, tachypnea over the course of the afternoon. Chest xray from this morning shows bilateral pleural effusions. He denies pain and is somnolent.  Will stop ativan , try precedex first for any etoh withdrawal symptoms given tenuous respiratory status. Renal function has normalized so can trial lasix x1. Will need repeat bmet given hypokalemia  Additional cc time 20 minutes.

## 2024-04-22 NOTE — Progress Notes (Signed)
 eLink Physician-Brief Progress Note Patient Name: IZACC DEMEYER DOB: 05/16/1956 MRN: [id number]   Date of Service  04/22/2024  HPI/Events of Note  Patient is tachypneic, auscultation per RN positive for rhonchi.  eICU Interventions  Stat portable CXR and ABG ordered.        Brennan Karam U Kale Rondeau 04/22/2024, 10:33 PM

## 2024-04-22 NOTE — Progress Notes (Signed)
 NAME:  [name], MRN:  [id number], DOB:  [birth year], LOS: 3 ADMISSION DATE:  04/19/2024, CONSULTATION DATE:  6/23 REFERRING MD:  Dr. Dreama , CHIEF COMPLAINT:  Pancreatitis   History of Present Illness:  68 year old male with PMH as below, which is significant for alcohol abuse, RA, pancreatitis, CHF, and DM. He was admitted in February of this year for a total hip arthoplasty for osteoarthritis. Drinks 10 beers per day. The perioperative course was uncomplicated. He now is presenting to [facility] ED 6/23 with complaints of abdominal pain for 3 days. Pain was diffuse, but worse over the right abdominal quadrants. CT of the abdomen demonstrated pancreatitis with necrosis. Lipase 283. CT also noted thrombus extending from the SMV into the portal splenic confluence. He was given IV fluids and started on antibiotics. He was also hypertensive and tachycardic, which was treated effectively with labetalol. GI was consulted by the emergency department and PCCM was asked to admit for severe pancreatitis.   Pertinent  Medical History   has a past medical history of Alcohol abuse, Allergic rhinitis, CHF (congestive heart failure) (HCC), Diabetes mellitus without complication (HCC), GERD (gastroesophageal reflux disease), Hepatitis, Hyperlipidemia, Hypertension, Osteoarthritis, Pancreatitis, Rheumatoid arthritis(714.0), Stroke (HCC), and Tobacco user.  Significant Hospital Events: Including procedures, antibiotic start and stop dates in addition to other pertinent events   6/23 admit for necrotizing pancreatitis 6/24 Hypertensive overnight treated with PRN IV medications, Still having diffuse abdominal pain 6/25 Intermittent episode of confusion/combativeness overnight  6/26 Lightly sedated with bilateral rhonchi, weak cough  Interim History / Subjective:  Sleepy but arousable this am   Objective    Blood pressure (!) 145/68, pulse 93, temperature 99.5 F (37.5 C), temperature source  Axillary, resp. rate (!) 31, height 5' 7 (1.702 m), weight 69.8 kg, SpO2 97%.        Intake/Output Summary (Last 24 hours) at 04/22/2024 0700 Last data filed at 04/22/2024 9392 Gross per 24 hour  Intake 1613.37 ml  Output 1650 ml  Net -36.63 ml   Filed Weights   04/20/24 0323 04/21/24 0500 04/22/24 0336  Weight: 55.1 kg 62.5 kg 69.8 kg    Examination:  General: Acute on chronically ill appearing thin middle aged male lying in bed, in NAD HEENT: Carter/AT, MM pink/moist, PERRL,  Neuro: Sedated, opens eyes to verbal stimuli  CV: s1s2 regular rate and rhythm, no murmur, rubs, or gallops,  PULM:  Bilateral rhonchi with weak cough, no increased work of breathing, on 4L  GI: soft, bowel sounds active in all 4 quadrants, non-tender, non-distended, tolerating TF Extremities: warm/dry, no edema  Skin: no rashes or lesions  Resolved problem list  Lactic acidosis -Secondary to pancreatitis  AKI  Assessment and Plan   Acute necrotizing pancreatitis -Severe pancreatitis. History of alcoholic pancreatitis. Also takes Farxiga  which could possibly be a contributor.  P: Surgery following, appreciate assistance  Continue trickle tube feeds, advance per surgery and GI Continue IV fluids  Multimodal pain control  Trend lipase and LFTs   Acute hypoxic respiratory failure  -Secondary to sedation in the setting of ETOH withdrawal  P: Continue supplemental oxygen wean as able  Head of bed elevated at 30 degree  Minimize sedation as able  Ensure adequate pulmonary hygiene  Aspiration precautions   SMV - portosplenic thrombus -Secondary to pancreatitis. Nonocclusive P: Continue heparin drip   Alcohol withdrawal with history of daily consumption  -Last drink 6/21 P: CIWA protocol  Stop scheduled Ativan  and monitor response, if  agitation worsens consider low phenobarb taper  Supplement thiamine , folate, and multivitamin  Scheduled and as needed  benzodiazepines  Hyponatremia Hypokalemia P: Supplement as needed   DM2 with hyperglycemia P: SSI CBG check q4  CBG goal 140-180 Home Farxiga  on hold   Chronic HFpEF HTN P: Start Coreg per tube Continuous telemetry  As needed IV antihypertensives  Optimize electrolytes    Rheumatoid arthritis -Doesn't appear as though he is on any home medications P: Supportive care   Tobacco abuse P: Nicotine  patch   Best Practice (right click and Reselect all SmartList Selections daily)   Diet/type: NPO DVT prophylaxis systemic heparin Pressure ulcer(s): pressure ulcer assessment deferred  GI prophylaxis: PPI Lines: N/A Foley:  N/A Code Status:  full code Last date of multidisciplinary goals of care discussion Pending   CRITICAL CARE Performed by: Jessi Jessop D. Harris  Total critical care time: 37 minutes  Critical care time was exclusive of separately billable procedures and treating other patients.  Critical care was necessary to treat or prevent imminent or life-threatening deterioration.  Critical care was time spent personally by me on the following activities: development of treatment plan with patient and/or surrogate as well as nursing, discussions with consultants, evaluation of patient's response to treatment, examination of patient, obtaining history from patient or surrogate, ordering and performing treatments and interventions, ordering and review of laboratory studies, ordering and review of radiographic studies, pulse oximetry and re-evaluation of patient's condition.  Toma Erichsen D. Harris, NP-C [facility] Personal contact information can be found on Amion  If no contact or response made please call [phone] 04/22/2024, 7:00 AM

## 2024-04-22 NOTE — Progress Notes (Signed)
 Subjective/Chief Complaint: Somnolent but responsive family at bedside    Objective: Vital signs in last 24 hours: Temp:  [98.2 F (36.8 C)-99.9 F (37.7 C)] (P) 98.2 F (36.8 C) (06/26 0722) Pulse Rate:  [85-110] 85 (06/26 0950) Resp:  [25-47] 30 (06/26 0950) BP: (109-201)/(56-84) 137/56 (06/26 1039) SpO2:  [87 %-98 %] 97 % (06/26 0950) Weight:  [69.8 kg] 69.8 kg (06/26 0336) Last BM Date :  (PTA)  Intake/Output from previous day: 06/25 0701 - 06/26 0700 In: 1613.4 [I.V.:1438.4; NG/GT:175] Out: 1650 [Urine:1650] Intake/Output this shift: Total I/O In: 60 [NG/GT:60] Out: 350 [Urine:350]  Abdomen: tender epigastrium   Lab Results:  Recent Labs    04/21/24 0334 04/22/24 0444  WBC 18.8* 14.8*  HGB 16.5 12.7*  HCT 46.5 37.4*  PLT 90* 82*   BMET Recent Labs    04/21/24 0334 04/22/24 0444  NA 135 136  K 4.0 3.1*  CL 103 103  CO2 19* 23  GLUCOSE 149* 202*  BUN 24* 20  CREATININE 1.11 0.76  CALCIUM  8.4* 8.3*   PT/INR Recent Labs    04/19/24 1331  LABPROT 14.0  INR 1.1   ABG Recent Labs    04/19/24 1439  HCO3 15.3*    Studies/Results: DG Abd 1 View Result Date: 04/21/2024 CLINICAL DATA:  Feeding tube placement. EXAM: ABDOMEN - 1 VIEW COMPARISON:  Same day. FINDINGS: Distal tip of feeding tube is seen in expected position of fourth portion of duodenum. No abnormal bowel dilatation is noted. IMPRESSION: Distal tip of feeding tube seen in expected position of fourth portion of duodenum. Electronically Signed   By: Lynwood Landy Raddle M.D.   On: 04/21/2024 15:26   DG Abd 1 View Result Date: 04/21/2024 CLINICAL DATA:  Nasogastric tube placement EXAM: ABDOMEN - 1 VIEW COMPARISON:  CT 04/19/2024 FINDINGS: Weighted tip feeding tube has been advanced into the second portion of the duodenum. Stomach is partially distended. A few gas distended mid abdominal small bowel loops. Colon decompressed. Mild lumbar levoscoliosis with multilevel spondylitic change.  IMPRESSION: Feeding tube tip in second portion of duodenum. Electronically Signed   By: JONETTA Faes M.D.   On: 04/21/2024 11:34    Anti-infectives: Anti-infectives (From admission, onward)    Start     Dose/Rate Route Frequency Ordered Stop   04/20/24 1800  meropenem (MERREM) 1 g in sodium chloride  0.9 % 100 mL IVPB  Status:  Discontinued        1 g 200 mL/hr over 30 Minutes Intravenous Every 12 hours 04/19/24 1725 04/19/24 1751   04/19/24 1715  meropenem (MERREM) 1 g in sodium chloride  0.9 % 100 mL IVPB  Status:  Discontinued        1 g 200 mL/hr over 30 Minutes Intravenous Every 8 hours 04/19/24 1705 04/19/24 1725   04/19/24 1415  cefTRIAXone (ROCEPHIN) 2 g in sodium chloride  0.9 % 100 mL IVPB        2 g 200 mL/hr over 30 Minutes Intravenous Once 04/19/24 1404 04/19/24 1458   04/19/24 1415  metroNIDAZOLE (FLAGYL) IVPB 500 mg        500 mg 100 mL/hr over 60 Minutes Intravenous  Once 04/19/24 1404 04/19/24 1521   04/19/24 1345  vancomycin (VANCOREADY) IVPB 1500 mg/300 mL  Status:  Discontinued        1,500 mg 150 mL/hr over 120 Minutes Intravenous  Once 04/19/24 1333 04/19/24 1403   04/19/24 1330  ceFEPIme (MAXIPIME) 2 g in sodium chloride  0.9 % 100  mL IVPB  Status:  Discontinued        2 g 200 mL/hr over 30 Minutes Intravenous  Once 04/19/24 1326 04/19/24 1403   04/19/24 1330  metroNIDAZOLE (FLAGYL) IVPB 500 mg  Status:  Discontinued        500 mg 100 mL/hr over 60 Minutes Intravenous  Once 04/19/24 1326 04/19/24 1403   04/19/24 1330  vancomycin (VANCOCIN) IVPB 1000 mg/200 mL premix  Status:  Discontinued        1,000 mg 200 mL/hr over 60 Minutes Intravenous  Once 04/19/24 1326 04/19/24 1332       Assessment/Plan:  68 yo male with severe acute pancreatitis with necrosis, presumably from EtOH use He has diffuse peripancreatic stranding with developing necrosis across the gland, but no walled-off collections, and no gas within the necrosis to suggest infection. Patient is very  early in the course of pancreatitis and there is no indication for acute surgical intervention. - No indication for antibiotics in the absence of infected necrosis  - Patient will need nutritional support. I have ordered a cortrak (nasojejunal) and consulted dietary for tube feeds - Surgery will follow    Non-occlusive SMV thrombus - secondary to above, on hep gtt    FEN: NPO, IVF ID: none VTE: hep gtt Foley: none at present, spont voids Dispo: ICU/CCM service    Other PMH: EtOH abuse - currently on scheduled ativan  AKI DM2 HFpEF HTN RA Tobacco abuse     LOS: 2 days    I reviewed nursing notes, hospitalist notes, last 24 h vitals and pain scores, last 48 h intake and output, last 24 h labs and trends, and last 24 h imaging results.   This care required straightforward level of medical decision making.    No acute need for surgery and WBC declining   Will sign off   LOS: 3 days    Debby A Taft Worthing 04/22/2024

## 2024-04-22 NOTE — Progress Notes (Signed)
 PHARMACY - ANTICOAGULATION CONSULT NOTE  Pharmacy Consult for heparin  Indication: SMV thrombus  Allergies  Allergen Reactions   Lisinopril  Swelling and Other (See Comments)    angioedema   Citalopram Nausea Only   Morphine  Nausea And Vomiting   Nsaids Other (See Comments)    Stomach Irritability    Paroxetine Nausea And Vomiting    Patient Measurements: Height: 5' 7 (170.2 cm) Weight: 69.8 kg (153 lb 14.1 oz) IBW/kg (Calculated) : 66.1 HEPARIN DW (KG): 56.4  Vital Signs: Temp: 97.7 F (36.5 C) (06/26 2026) Temp Source: Oral (06/26 2026) BP: 163/71 (06/26 2026) Pulse Rate: 90 (06/26 2026)  Labs: Recent Labs    04/20/24 1231 04/20/24 2049 04/21/24 0334 04/22/24 0444 04/22/24 1234 04/22/24 1714 04/22/24 2038  HGB 18.6*  --  16.5 12.7*  --   --   --   HCT 51.4  --  46.5 37.4*  --   --   --   PLT 97*  --  90* 82*  --   --   --   HEPARINUNFRC 0.97*   < > 0.46 0.15* 0.19*  --  0.32  CREATININE  --   --  1.11 0.76  --  0.82  --    < > = values in this interval not displayed.    Estimated Creatinine Clearance: 81.7 mL/min (by C-G formula based on SCr of 0.82 mg/dL).   Medical History: Past Medical History:  Diagnosis Date   Alcohol abuse    Allergic rhinitis    CHF (congestive heart failure) (HCC)    Diabetes mellitus without complication (HCC)    'sometime last yr'--type 2   GERD (gastroesophageal reflux disease)    Hepatitis    he thinks its hep b or c   Hyperlipidemia    Hypertension    Osteoarthritis    Pancreatitis    Rheumatoid arthritis(714.0)    Stroke (HCC)    Tobacco user     Assessment: Patient is a 68 y.o M with hx EtOH abuse and pancreatitis who presented to the ED om 04/19/24 with c/o abdominal pain.   Abdominal CT on 04/19/24 showed findings with concern for necrotizing pancreatitis and thrombus extending from the SMV into the porta splenic confluence and proximal main portal vein.  He is currently on heparin drip for VTE treatment.    Today, 04/22/2024: - most recent heparin level improved to therapeutic (at low end of goal range) after increasing rate to 1300 units/hr - hgb down 12.7, plts down 82K; now off IVF - no bleeding or infusion issues per RN report   Goal of Therapy:  Heparin level 0.3-0.7 units/ml Monitor platelets by anticoagulation protocol: Yes   Plan:  - Increase heparin drip slightly to 1350 units/hr - check confirmatory heparin level with AM labs - daily CBC & heparin level - monitor for s/sx bleeding   Dafina Suk A 04/22/2024,9:26 PM

## 2024-04-22 NOTE — Progress Notes (Signed)
 PHARMACY - ANTICOAGULATION CONSULT NOTE  Pharmacy Consult for heparin  Indication: SMV thrombus  Allergies  Allergen Reactions   Lisinopril  Swelling and Other (See Comments)    angioedema   Citalopram Nausea Only   Morphine  Nausea And Vomiting   Nsaids Other (See Comments)    Stomach Irritability    Paroxetine Nausea And Vomiting    Patient Measurements: Height: 5' 7 (170.2 cm) Weight: 69.8 kg (153 lb 14.1 oz) IBW/kg (Calculated) : 66.1 HEPARIN DW (KG): 56.4  Vital Signs: Temp: (P) 98.2 F (36.8 C) (06/26 0722) Temp Source: (P) Oral (06/26 0722) BP: 201/82 (06/26 0803) Pulse Rate: 102 (06/26 0803)  Labs: Recent Labs    04/19/24 1331 04/19/24 1331 04/19/24 1420 04/19/24 1923 04/20/24 0143 04/20/24 1231 04/20/24 2049 04/21/24 0334 04/22/24 0444  HGB 20.6*  --    < >  --  20.3* 18.6*  --  16.5 12.7*  HCT 56.6*  --    < >  --  56.7* 51.4  --  46.5 37.4*  PLT 218  --   --   --  113* 97*  --  90* 82*  APTT  --   --   --  >200*  --   --   --   --   --   LABPROT 14.0  --   --   --   --   --   --   --   --   INR 1.1  --   --   --   --   --   --   --   --   HEPARINUNFRC  --    < >  --   --  0.79* 0.97* 0.62 0.46 0.15*  CREATININE  --   --    < >  --  1.57*  --   --  1.11 0.76   < > = values in this interval not displayed.    Estimated Creatinine Clearance: 83.8 mL/min (by C-G formula based on SCr of 0.76 mg/dL).   Medical History: Past Medical History:  Diagnosis Date   Alcohol abuse    Allergic rhinitis    CHF (congestive heart failure) (HCC)    Diabetes mellitus without complication (HCC)    'sometime last yr'--type 2   GERD (gastroesophageal reflux disease)    Hepatitis    he thinks its hep b or c   Hyperlipidemia    Hypertension    Osteoarthritis    Pancreatitis    Rheumatoid arthritis(714.0)    Stroke (HCC)    Tobacco user     Assessment: Patient is a 68 y.o M with hx EtOH abuse and pancreatitis who presented to the ED om 04/19/24 with c/o  abdominal pain.   Abdominal CT on 04/19/24 showed findings with concern for necrotizing pancreatitis and thrombus extending from the SMV into the porta splenic confluence and proximal main portal vein.  He is currently on heparin drip for VTE treatment.   Today, 04/22/2024: - heparin level collected at 12:30p is sub-therapeutic at 0.19 - hgb down 12.7, plts down 82K; now off IVF - no bleeding documented    Goal of Therapy:  Heparin level 0.3-0.7 units/ml Monitor platelets by anticoagulation protocol: Yes   Plan:  - Increase heparin drip to 1300 units/hr - check 6 hr heparin level  - monitor for s/sx bleeding   Halley Kincer P 04/22/2024,8:11 AM

## 2024-04-22 NOTE — Plan of Care (Signed)
   Problem: Nutrition: Goal: Adequate nutrition will be maintained Outcome: Progressing   Problem: Coping: Goal: Level of anxiety will decrease Outcome: Progressing   Problem: Elimination: Goal: Will not experience complications related to bowel motility Outcome: Progressing

## 2024-04-22 NOTE — Progress Notes (Signed)
 Brief Nutrition Support Update  Trickle tube feeds started yesterday evening. Per discussion with RN this AM, patient tolerating tube feeds well so far today. RN reports GI presented to bedside this AM and okay with feeding patient with Cortrak tip in 4th portion of duodenum. GI signed off. Discussed with Surgery and CCM, can begin advancing tube feeds towards goal as tolerated. Discussed with RN about advancements as well.  INTERVENTION:  Osmolite 1.5 at 55 ml/h (1320 ml per day) *Increase to 4mL/hr and then begin advancing by 10mL Q12H Prosource TF20 60 ml daily Provides 2060 kcal, 102 gm protein, 1006 ml free water daily   - Monitor magnesium , potassium, and phosphorus BID for at least 3 days, MD to replete as needed, as pt is at risk for refeeding syndrome given ,malnutrition with unknown oral intake PTA and suspected weight loss.    - FWF per CCM. Would recommend 150mL Q4H.   - Continue 1mg  folic acid , 100mg  thiamine , and Multivitamin with minerals daily.   - Monitor weight trends.   - Will monitor for diet advancement.  Trude Ned RD, LDN Contact via [employer].

## 2024-04-22 NOTE — Progress Notes (Signed)
 PHARMACY - ANTICOAGULATION CONSULT NOTE  Pharmacy Consult for IV Heparin Indication: SMV Thrombus  Allergies  Allergen Reactions   Lisinopril  Swelling and Other (See Comments)    angioedema   Citalopram Nausea Only   Morphine  Nausea And Vomiting   Nsaids Other (See Comments)    Stomach Irritability    Paroxetine Nausea And Vomiting    Patient Measurements: Height: 5' 7 (170.2 cm) Weight: 69.8 kg (153 lb 14.1 oz) IBW/kg (Calculated) : 66.1 HEPARIN DW (KG): 56.4  Vital Signs: Temp: 99.5 F (37.5 C) (06/26 0346) Temp Source: Axillary (06/26 0346) BP: 181/71 (06/26 0500) Pulse Rate: 105 (06/26 0500)  Labs: Recent Labs    04/19/24 1331 04/19/24 1331 04/19/24 1420 04/19/24 1532 04/19/24 1923 04/20/24 0143 04/20/24 1231 04/20/24 2049 04/21/24 0334 04/22/24 0444  HGB 20.6*  --    < >  --   --  20.3* 18.6*  --  16.5 12.7*  HCT 56.6*  --    < >  --   --  56.7* 51.4  --  46.5 37.4*  PLT 218  --   --   --   --  113* 97*  --  90* 82*  APTT  --   --   --   --  >200*  --   --   --   --   --   LABPROT 14.0  --   --   --   --   --   --   --   --   --   INR 1.1  --   --   --   --   --   --   --   --   --   HEPARINUNFRC  --    < >  --   --   --  0.79* 0.97* 0.62 0.46 0.15*  CREATININE  --   --    < > 1.48*  --  1.57*  --   --  1.11  --    < > = values in this interval not displayed.    Estimated Creatinine Clearance: 60.4 mL/min (by C-G formula based on SCr of 1.11 mg/dL).  Medications:  Not on anticoagulants PTA -Previously prescribed Xarelto  x30 days in Feb 2025 s/p THA  Assessment: Pt is a 58 yoM with history of EtOH dependency, admitted with pancreatitis. CT revealed SMV thrombus - pharmacy consulted to dose heparin.   Today, 04/22/24 Heparin level = 0.15 subtherapeutic  on 900 units/hr CBC = Hgb 12.7, Plt low and decreasing No bleeding noted  Goal of Therapy:  Heparin level 0.3-0.7 units/ml Monitor platelets by anticoagulation protocol: Yes   Plan:  Bolus  heparin 2000 units x 1 Increase heparin drip to 1100 units/hr Check heparin level in 6 hours CBC, heparin level daily Monitor for signs of bleeding  Leeroy Mace RPh 04/22/2024, 5:45 AM

## 2024-04-23 DIAGNOSIS — F10131 Alcohol abuse with withdrawal delirium: Secondary | ICD-10-CM | POA: Diagnosis not present

## 2024-04-23 DIAGNOSIS — K8521 Alcohol induced acute pancreatitis with uninfected necrosis: Secondary | ICD-10-CM | POA: Diagnosis not present

## 2024-04-23 DIAGNOSIS — N179 Acute kidney failure, unspecified: Secondary | ICD-10-CM | POA: Diagnosis not present

## 2024-04-23 DIAGNOSIS — E8729 Other acidosis: Secondary | ICD-10-CM | POA: Diagnosis not present

## 2024-04-23 LAB — GLUCOSE, CAPILLARY
Glucose-Capillary: 164 mg/dL — ABNORMAL HIGH (ref 70–99)
Glucose-Capillary: 176 mg/dL — ABNORMAL HIGH (ref 70–99)
Glucose-Capillary: 194 mg/dL — ABNORMAL HIGH (ref 70–99)
Glucose-Capillary: 195 mg/dL — ABNORMAL HIGH (ref 70–99)
Glucose-Capillary: 204 mg/dL — ABNORMAL HIGH (ref 70–99)
Glucose-Capillary: 213 mg/dL — ABNORMAL HIGH (ref 70–99)
Glucose-Capillary: 215 mg/dL — ABNORMAL HIGH (ref 70–99)

## 2024-04-23 LAB — CBC
HCT: 32.1 % — ABNORMAL LOW (ref 39.0–52.0)
Hemoglobin: 11.1 g/dL — ABNORMAL LOW (ref 13.0–17.0)
MCH: 33.8 pg (ref 26.0–34.0)
MCHC: 34.6 g/dL (ref 30.0–36.0)
MCV: 97.9 fL (ref 80.0–100.0)
Platelets: 80 10*3/uL — ABNORMAL LOW (ref 150–400)
RBC: 3.28 MIL/uL — ABNORMAL LOW (ref 4.22–5.81)
RDW: 14 % (ref 11.5–15.5)
WBC: 12.3 10*3/uL — ABNORMAL HIGH (ref 4.0–10.5)
nRBC: 0.2 % (ref 0.0–0.2)

## 2024-04-23 LAB — COMPREHENSIVE METABOLIC PANEL WITH GFR
ALT: 30 U/L (ref 0–44)
AST: 62 U/L — ABNORMAL HIGH (ref 15–41)
Albumin: 2.3 g/dL — ABNORMAL LOW (ref 3.5–5.0)
Alkaline Phosphatase: 64 U/L (ref 38–126)
Anion gap: 8 (ref 5–15)
BUN: 16 mg/dL (ref 8–23)
CO2: 25 mmol/L (ref 22–32)
Calcium: 8.3 mg/dL — ABNORMAL LOW (ref 8.9–10.3)
Chloride: 104 mmol/L (ref 98–111)
Creatinine, Ser: 0.75 mg/dL (ref 0.61–1.24)
GFR, Estimated: >60 mL/min (ref >60)
Glucose, Bld: 171 mg/dL — ABNORMAL HIGH (ref 70–99)
Potassium: 3.2 mmol/L — ABNORMAL LOW (ref 3.5–5.1)
Sodium: 137 mmol/L (ref 135–145)
Total Bilirubin: 1.6 mg/dL — ABNORMAL HIGH (ref 0.0–1.2)
Total Protein: 5.9 g/dL — ABNORMAL LOW (ref 6.5–8.1)

## 2024-04-23 LAB — MAGNESIUM
Magnesium: 2.1 mg/dL (ref 1.7–2.4)
Magnesium: 2.1 mg/dL (ref 1.7–2.4)

## 2024-04-23 LAB — HEPARIN LEVEL (UNFRACTIONATED): Heparin Unfractionated: 0.41 [IU]/mL (ref 0.30–0.70)

## 2024-04-23 LAB — PHOSPHORUS
Phosphorus: 3.2 mg/dL (ref 2.5–4.6)
Phosphorus: 2.5 mg/dL (ref 2.5–4.6)

## 2024-04-23 MED ORDER — INSULIN GLARGINE-YFGN 100 UNIT/ML ~~LOC~~ SOLN
12.0000 [IU] | Freq: Every day | SUBCUTANEOUS | Status: DC
  Administered 2024-04-23 – 2024-04-25 (×3): 12 [IU] via SUBCUTANEOUS
  Filled 2024-04-23 (×4): qty 0.12

## 2024-04-23 MED ORDER — LOPERAMIDE HCL 2 MG PO CAPS
2.0000 mg | ORAL_CAPSULE | Freq: Once | ORAL | Status: DC
  Filled 2024-04-23: qty 1

## 2024-04-23 MED ORDER — ORAL CARE MOUTH RINSE: 15.0000 mL | OROMUCOSAL | Status: DC | PRN

## 2024-04-23 MED ORDER — LOPERAMIDE HCL 1 MG/7.5ML PO SUSP
2.0000 mg | Freq: Once | ORAL | Status: AC
  Administered 2024-04-23: 2 mg
  Filled 2024-04-23: qty 15

## 2024-04-23 MED ORDER — ORAL CARE MOUTH RINSE
15.0000 mL | OROMUCOSAL | Status: DC
  Administered 2024-04-23 – 2024-04-24 (×2): 15 mL via OROMUCOSAL

## 2024-04-23 MED ORDER — CHLORDIAZEPOXIDE HCL 25 MG PO CAPS
50.0000 mg | ORAL_CAPSULE | Freq: Once | ORAL | Status: AC
  Administered 2024-04-23: 50 mg
  Filled 2024-04-23: qty 2

## 2024-04-23 MED ORDER — CHLORDIAZEPOXIDE HCL 25 MG PO CAPS
25.0000 mg | ORAL_CAPSULE | Freq: Every day | ORAL | Status: AC
  Administered 2024-04-26: 25 mg
  Filled 2024-04-23: qty 1

## 2024-04-23 MED ORDER — INSULIN ASPART 100 UNIT/ML IJ SOLN
2.0000 [IU] | INTRAMUSCULAR | Status: DC
  Administered 2024-04-23 – 2024-04-27 (×26): 2 [IU] via SUBCUTANEOUS

## 2024-04-23 MED ORDER — POTASSIUM CHLORIDE 20 MEQ PO PACK
40.0000 meq | PACK | ORAL | Status: AC
  Administered 2024-04-23 (×2): 40 meq
  Filled 2024-04-23 (×2): qty 2

## 2024-04-23 MED ORDER — NICOTINE 21 MG/24HR TD PT24
21.0000 mg | MEDICATED_PATCH | Freq: Every day | TRANSDERMAL | Status: DC
  Administered 2024-04-23 – 2024-05-06 (×14): 21 mg via TRANSDERMAL
  Filled 2024-04-23 (×14): qty 1

## 2024-04-23 MED ORDER — CHLORDIAZEPOXIDE HCL 25 MG PO CAPS
25.0000 mg | ORAL_CAPSULE | Freq: Three times a day (TID) | ORAL | Status: AC
  Administered 2024-04-24 – 2024-04-25 (×3): 25 mg
  Filled 2024-04-23 (×3): qty 1

## 2024-04-23 MED ORDER — CHLORDIAZEPOXIDE HCL 25 MG PO CAPS
25.0000 mg | ORAL_CAPSULE | ORAL | Status: AC
  Administered 2024-04-25 – 2024-04-26 (×2): 25 mg
  Filled 2024-04-23 (×2): qty 1

## 2024-04-23 MED ORDER — CHLORDIAZEPOXIDE HCL 25 MG PO CAPS
25.0000 mg | ORAL_CAPSULE | Freq: Four times a day (QID) | ORAL | Status: AC
  Administered 2024-04-23 – 2024-04-24 (×4): 25 mg
  Filled 2024-04-23 (×4): qty 1

## 2024-04-23 MED ORDER — ORAL CARE MOUTH RINSE
15.0000 mL | OROMUCOSAL | Status: DC
  Administered 2024-04-23 (×3): 15 mL via OROMUCOSAL

## 2024-04-23 MED ORDER — POTASSIUM CHLORIDE 20 MEQ PO PACK
40.0000 meq | PACK | Freq: Once | ORAL | Status: AC
  Administered 2024-04-23: 40 meq
  Filled 2024-04-23: qty 2

## 2024-04-23 MED ORDER — FUROSEMIDE 10 MG/ML IJ SOLN
40.0000 mg | Freq: Once | INTRAMUSCULAR | Status: AC
  Administered 2024-04-23: 40 mg via INTRAVENOUS
  Filled 2024-04-23: qty 4

## 2024-04-23 NOTE — Plan of Care (Incomplete)
   Problem: Education: Goal: Knowledge of General Education information will improve Description: Including pain rating scale, medication(s)/side effects and non-pharmacologic comfort measures Outcome: Progressing   Problem: Clinical Measurements: Goal: Ability to maintain clinical measurements within normal limits will improve Outcome: Progressing Goal: Cardiovascular complication will be avoided Outcome: Progressing   Problem: Activity: Goal: Risk for activity intolerance will decrease Outcome: Progressing   Problem: Nutrition: Goal: Adequate nutrition will be maintained Outcome: Progressing

## 2024-04-23 NOTE — Progress Notes (Signed)
 NAME:  [name], MRN:  [id number], DOB:  06/07/1956, LOS: 4 ADMISSION DATE:  04/19/2024, CONSULTATION DATE:  6/23 REFERRING MD:  Dr. Dreama , CHIEF COMPLAINT:  Pancreatitis   History of Present Illness:  68 year old male with PMH as below, which is significant for alcohol abuse, RA, pancreatitis, CHF, and DM. He was admitted in February of this year for a total hip arthoplasty for osteoarthritis. Drinks 10 beers per day. The perioperative course was uncomplicated. He now is presenting to [facility] ED 6/23 with complaints of abdominal pain for 3 days. Pain was diffuse, but worse over the right abdominal quadrants. CT of the abdomen demonstrated pancreatitis with necrosis. Lipase 283. CT also noted thrombus extending from the SMV into the portal splenic confluence. He was given IV fluids and started on antibiotics. He was also hypertensive and tachycardic, which was treated effectively with labetalol . GI was consulted by the emergency department and PCCM was asked to admit for severe pancreatitis.   Pertinent  Medical History   has a past medical history of Alcohol abuse, Allergic rhinitis, CHF (congestive heart failure) (HCC), Diabetes mellitus without complication (HCC), GERD (gastroesophageal reflux disease), Hepatitis, Hyperlipidemia, Hypertension, Osteoarthritis, Pancreatitis, Rheumatoid arthritis(714.0), Stroke (HCC), and Tobacco user.  Significant Hospital Events: Including procedures, antibiotic start and stop dates in addition to other pertinent events   6/23 admit for necrotizing pancreatitis 6/24 Hypertensive overnight treated with PRN IV medications, Still having diffuse abdominal pain 6/25 Intermittent episode of confusion/combativeness overnight  6/26 Lightly sedated with bilateral rhonchi, weak cough  Interim History / Subjective:  Sedated this morning with precedex  Some agitation overnight  Objective    Blood pressure 126/64, pulse 73, temperature 98.2 F (36.8 C),  temperature source Oral, resp. rate (!) 30, height 5' 7 (1.702 m), weight 65.6 kg, SpO2 98%.        Intake/Output Summary (Last 24 hours) at 04/23/2024 0710 Last data filed at 04/23/2024 0600 Gross per 24 hour  Intake 2574.04 ml  Output 3050 ml  Net -475.96 ml   Filed Weights   04/21/24 0500 04/22/24 0336 04/23/24 0200  Weight: 62.5 kg 69.8 kg 65.6 kg    Examination:  General: Thin adult male resting comfortably in bed HEENT: [place]/AT, Pupils pinpoint, no JVD Neuro: Sedated, RASS -3, CV: irregularly irregular, no MRG  PULM:  Diminished throughout. Crackles R base.  GI: Soft, NT, ND Extremities: No acute deformity or edema.  Skin: no rashes or lesions  Resolved problem list  Lactic acidosis -Secondary to pancreatitis  AKI  Assessment and Plan   Acute necrotizing pancreatitis -Severe pancreatitis. History of alcoholic pancreatitis. Also takes Farxiga  which could possibly be a contributor.  P: Surgery following, appreciate assistance  Continue trickle tube feeds, advance per surgery and GI Continue IV fluids Multimodal pain control Trend lipase and LFTs  Acute hypoxic respiratory failure  -Secondary to sedation in the setting of ETOH withdrawal  -Possible pulmonary edema vs aspiration vs atelectasis from hypoventilation/abd pain. WBC improving.  P: Supplemental O2 as indicated to keep sats > 95% Give additional dose of lasix  once his K has been corrected.  Pulmonary hygiene. Unable to perform IS due to somnolence/ETOH wd.  Not a BiPAP candidate Low threshold for intubation should he worsen.   SMV - portosplenic thrombus -Secondary to pancreatitis. Nonocclusive P: Continue heparin  drip   Alcohol withdrawal with history of daily consumption  DTs -Last drink 6/21 P: CIWA protocol - ativan  Precedex  for RASS goal 0 to -1 Start librium  taper via tube to hopefully limit sedative effects of treatment and avoid intubation. Supplement thiamine , folate, and multivitamin    Hyponatremia Hypokalemia P: 40 meq K x2 today Repeat chemistry in the morning.   DM2 with hyperglycemia P: SSI CBG check q4  CBG goal 140-180 Increase semglee  to 12 units daily - received 15 units SSI in the last 24 hours and sugars have been running 160s to 220s.  Home Farxiga  on hold   Chronic HFpEF HTN P: Amlodipine , coreg  per tube (home meds) Continuous telemetry  As needed IV antihypertensives   Rheumatoid arthritis -Doesn't appear as though he is on any home medications P: Supportive care    Best Practice (right click and Reselect all SmartList Selections daily)   Diet/type: NPO DVT prophylaxis systemic heparin  Pressure ulcer(s): pressure ulcer assessment deferred  GI prophylaxis: PPI Lines: N/A Foley:  N/A Code Status:  full code Last date of multidisciplinary goals of care discussion Pending   CRITICAL CARE  Critical care time 43 minutes necessary due to severe alcoholic pancreatitis, respiratory failure, and alcohol withdrawal with DTs  Deward Eastern, AGACNP-BC [place]  See Amion for personal pager PCCM on call pager [phone] until 7pm. Please call Elink 7p-7a. [phone]  04/23/2024 7:22 AM

## 2024-04-23 NOTE — Progress Notes (Signed)
 PHARMACY - ANTICOAGULATION CONSULT NOTE  Pharmacy Consult for heparin   Indication: SMV thrombus  Allergies  Allergen Reactions   Lisinopril  Swelling and Other (See Comments)    angioedema   Citalopram Nausea Only   Morphine  Nausea And Vomiting   Nsaids Other (See Comments)    Stomach Irritability    Paroxetine Nausea And Vomiting    Patient Measurements: Height: 5' 7 (170.2 cm) Weight: 65.6 kg (144 lb 10 oz) IBW/kg (Calculated) : 66.1 HEPARIN  DW (KG): 65.6  Vital Signs: Temp: 98.2 F (36.8 C) (06/27 0320) Temp Source: Oral (06/27 0320) BP: 118/50 (06/27 0500) Pulse Rate: 66 (06/27 0500)  Labs: Recent Labs    04/21/24 0334 04/22/24 0444 04/22/24 1234 04/22/24 1714 04/22/24 2038 04/23/24 0438  HGB 16.5 12.7*  --   --   --  11.1*  HCT 46.5 37.4*  --   --   --  32.1*  PLT 90* 82*  --   --   --  80*  HEPARINUNFRC 0.46 0.15* 0.19*  --  0.32 0.41  CREATININE 1.11 0.76  --  0.82  --  0.75    Estimated Creatinine Clearance: 83.1 mL/min (by C-G formula based on SCr of 0.75 mg/dL).   Medical History: Past Medical History:  Diagnosis Date   Alcohol abuse    Allergic rhinitis    CHF (congestive heart failure) (HCC)    Diabetes mellitus without complication (HCC)    'sometime last yr'--type 2   GERD (gastroesophageal reflux disease)    Hepatitis    he thinks its hep b or c   Hyperlipidemia    Hypertension    Osteoarthritis    Pancreatitis    Rheumatoid arthritis(714.0)    Stroke (HCC)    Tobacco user     Assessment: Patient is a 68 y.o M with hx EtOH abuse and pancreatitis who presented to the ED om 04/19/24 with c/o abdominal pain.   Abdominal CT on 04/19/24 showed findings with concern for necrotizing pancreatitis and thrombus extending from the SMV into the porta splenic confluence and proximal main portal vein.  He is currently on heparin  drip for VTE treatment.   Today, 04/23/2024: - HL 0.41 therapeutic on 1350 units/hr - hgb down 11.1, plts down 80K;   - no bleeding or infusion issues reported   Goal of Therapy:  Heparin  level 0.3-0.7 units/ml Monitor platelets by anticoagulation protocol: Yes   Plan:  - continue heparin  drip at 1350 units/hr - daily CBC & heparin  level - monitor for s/sx bleeding   Leeroy Mace RPh 04/23/2024, 5:24 AM

## 2024-04-23 NOTE — Progress Notes (Signed)
 Arterial puncture attempted without success. Patient in mittens and combative despite adding physical restraint by several RN's, and he repeatedly says No to labs and twists his wrists making it difficult to accurately and safely puncture the artery. Elink RN made aware. Awaiting feedback from MD.

## 2024-04-23 NOTE — Progress Notes (Signed)
 [facility] ADULT ICU REPLACEMENT PROTOCOL   The patient does apply for the [facility] Adult ICU Electrolyte Replacment Protocol based on the criteria listed below:   1.Exclusion criteria: TCTS, ECMO, Dialysis, and Myasthenia Gravis patients 2. Is GFR >/= 30 ml/min? Yes.    Patient's GFR today is >60 3. Is SCr </= 2? Yes.   Patient's SCr is 0.75 mg/dL 4. Did SCr increase >/= 0.5 in 24 hours? No. 5.Pt's weight >40kg  Yes.   6. Abnormal electrolyte(s): Potassium  7. Electrolytes replaced per protocol 8.  Call MD STAT for K+ </= 2.5, Phos </= 1, or Mag </= 1 Physician:  Dr. Epimenio Medico A [name] 04/23/2024 5:41 AM

## 2024-04-23 NOTE — Plan of Care (Signed)
  Problem: Clinical Measurements: Goal: Respiratory complications will improve Outcome: Not Progressing   Problem: Activity: Goal: Risk for activity intolerance will decrease Outcome: Not Progressing   Problem: Coping: Goal: Level of anxiety will decrease Outcome: Not Progressing   Problem: Elimination: Goal: Will not experience complications related to bowel motility Outcome: Not Progressing Goal: Will not experience complications related to urinary retention Outcome: Not Progressing   Problem: Safety: Goal: Ability to remain free from injury will improve Outcome: Not Progressing

## 2024-04-23 NOTE — Progress Notes (Signed)
 eLink Physician-Brief Progress Note Patient Name: [name] DOB: December 19, 1955 MRN: [id number]   Date of Service  04/23/2024  HPI/Events of Note  Patient with a small amount of blood coughed up and suctioned by RN, likely less than 5 ml, has not recurred.  eICU Interventions  CBC and factor Xa now, H & H Q 6 x 2 to proactively trend hemoglobin, continue Heparin  gtt for now, incentive spirometer / flutter valve to counteract atelectasis.        Dalin Caldera U Ludger Bones 04/23/2024, 11:30 PM

## 2024-04-23 NOTE — Progress Notes (Signed)
   PM rounds  Tachepneic but not paradoxical and no acc mucle use  Hold off intubation Mnitor closely     SIGNATURE    Dr. Dorethia Cave, M.D., F.C.C.P,  Pulmonary and Critical Care Medicine Staff Physician, [facility] Center Director - Interstitial Lung Disease  Program  Pulmonary Fibrosis [facility] [place], [place], [address]   Pager: [phone], If no answer  -> Check AMION or Try [phone] Telephone (clinical office): [phone] Telephone (research): [phone]  6:05 PM 04/23/2024

## 2024-04-23 NOTE — TOC Progression Note (Signed)
 Transition of Care [facility]) - Progression Note    Patient Details  Name: [name] MRN: [id number] Date of Birth: 01-Nov-1955  [facility]) CM/SW Contact  Jon ONEIDA Anon, RN Phone Number: 04/23/2024, 11:45 AM  Clinical Narrative:    Pt remains in ICU, currently needing tube feedings. Pt is not medically ready to DC. TOC is continuing to follow for any new recommendations or DC needs.   Expected Discharge Plan: Home/Self Care Barriers to Discharge: Continued Medical Work up  Expected Discharge Plan and Services In-house Referral: Clinical Social Work Discharge Planning Services: CM Consult Post Acute Care Choice: NA Living arrangements for the past 2 months: Single Family Home                 DME Arranged: N/A DME Agency: NA       HH Arranged: NA HH Agency: NA         Social Determinants of Health (SDOH) Interventions SDOH Screenings   Food Insecurity: Patient Unable To Answer (04/20/2024)  Housing: Patient Unable To Answer (04/20/2024)  Transportation Needs: Patient Unable To Answer (04/20/2024)  Utilities: Patient Unable To Answer (04/20/2024)  Alcohol Screen: Low Risk  (07/10/2023)  Depression (PHQ2-9): High Risk (04/02/2024)  Financial Resource Strain: Low Risk  (07/10/2023)  Physical Activity: Sufficiently Active (07/10/2023)  Social Connections: Patient Unable To Answer (04/20/2024)  Stress: No Stress Concern Present (07/10/2023)  Tobacco Use: High Risk (04/19/2024)  Health Literacy: Adequate Health Literacy (07/10/2023)    Readmission Risk Interventions    04/21/2024   11:05 AM  Readmission Risk Prevention Plan  Transportation Screening Complete  PCP or Specialist Appt within 3-5 Days Complete  HRI or Home Care Consult Complete  Social Work Consult for Recovery Care Planning/Counseling Complete  Palliative Care Screening Not Applicable  Medication Review Oceanographer) Complete

## 2024-04-23 NOTE — Progress Notes (Addendum)
 Patient had 4 BMs on day shift and is continuing to have soft liquid BMs on night shift as well -likely TF related Imodium x 1  [name] V. Jude MD

## 2024-04-23 NOTE — Inpatient Diabetes Management (Signed)
 Inpatient Diabetes Program Recommendations  AACE/ADA: New Consensus Statement on Inpatient Glycemic Control (2015)  Target Ranges:  Prepandial:   less than 140 mg/dL      Peak postprandial:   less than 180 mg/dL (1-2 hours)      Critically ill patients:  140 - 180 mg/dL   Lab Results  Component Value Date   GLUCAP 215 (H) 04/23/2024   HGBA1C 6.1 (H) 04/02/2024    Review of Glycemic Control  Latest Reference Range & Units 04/22/24 11:36 04/22/24 15:27 04/22/24 20:24 04/23/24 00:48 04/23/24 03:28 04/23/24 07:42  Glucose-Capillary 70 - 99 mg/dL 806 (H) 776 (H) 796 (H) 195 (H) 164 (H) 215 (H)  (H): Data is abnormally high Diabetes history: Type 2 DM Outpatient Diabetes medications: Farxiga  10 mg every day (NT)?, Metformin  500 mg every day (NT) Current orders for Inpatient glycemic control: Semglee  12 units every day, Novolog  0-9 units Q4H Osmolite @ 50 ml/hr   Inpatient Diabetes Program Recommendations:    Noted changes this AM to Semglee .  Consider also adding Novolog  2 units Q4H for tube feed coverage (to be stopped or held in the event tube feeds stopped).   Thanks, Tinnie Minus, MSN, RNC-OB Diabetes Coordinator [phone] (8a-5p)

## 2024-04-24 DIAGNOSIS — D649 Anemia, unspecified: Secondary | ICD-10-CM | POA: Diagnosis not present

## 2024-04-24 DIAGNOSIS — R042 Hemoptysis: Secondary | ICD-10-CM

## 2024-04-24 DIAGNOSIS — J9601 Acute respiratory failure with hypoxia: Secondary | ICD-10-CM | POA: Diagnosis not present

## 2024-04-24 DIAGNOSIS — K8521 Alcohol induced acute pancreatitis with uninfected necrosis: Secondary | ICD-10-CM | POA: Diagnosis not present

## 2024-04-24 LAB — CBC WITH DIFFERENTIAL/PLATELET
Abs Immature Granulocytes: 0.44 10*3/uL — ABNORMAL HIGH (ref 0.00–0.07)
Basophils Absolute: 0 10*3/uL (ref 0.0–0.1)
Basophils Relative: 0 %
Eosinophils Absolute: 0 10*3/uL (ref 0.0–0.5)
Eosinophils Relative: 0 %
HCT: 33.8 % — ABNORMAL LOW (ref 39.0–52.0)
Hemoglobin: 11 g/dL — ABNORMAL LOW (ref 13.0–17.0)
Immature Granulocytes: 4 %
Lymphocytes Relative: 18 %
Lymphs Abs: 2.3 10*3/uL (ref 0.7–4.0)
MCH: 33.2 pg (ref 26.0–34.0)
MCHC: 32.5 g/dL (ref 30.0–36.0)
MCV: 102.1 fL — ABNORMAL HIGH (ref 80.0–100.0)
Monocytes Absolute: 1.6 10*3/uL — ABNORMAL HIGH (ref 0.1–1.0)
Monocytes Relative: 13 %
Neutro Abs: 7.9 10*3/uL — ABNORMAL HIGH (ref 1.7–7.7)
Neutrophils Relative %: 65 %
Platelets: 96 10*3/uL — ABNORMAL LOW (ref 150–400)
RBC: 3.31 MIL/uL — ABNORMAL LOW (ref 4.22–5.81)
RDW: 14.1 % (ref 11.5–15.5)
WBC: 12.3 10*3/uL — ABNORMAL HIGH (ref 4.0–10.5)
nRBC: 0.3 % — ABNORMAL HIGH (ref 0.0–0.2)

## 2024-04-24 LAB — CBC
HCT: 31.5 % — ABNORMAL LOW (ref 39.0–52.0)
Hemoglobin: 10.5 g/dL — ABNORMAL LOW (ref 13.0–17.0)
MCH: 33.5 pg (ref 26.0–34.0)
MCHC: 33.3 g/dL (ref 30.0–36.0)
MCV: 100.6 fL — ABNORMAL HIGH (ref 80.0–100.0)
Platelets: 99 10*3/uL — ABNORMAL LOW (ref 150–400)
RBC: 3.13 MIL/uL — ABNORMAL LOW (ref 4.22–5.81)
RDW: 14.4 % (ref 11.5–15.5)
WBC: 12.2 10*3/uL — ABNORMAL HIGH (ref 4.0–10.5)
nRBC: 0.6 % — ABNORMAL HIGH (ref 0.0–0.2)

## 2024-04-24 LAB — GLUCOSE, CAPILLARY
Glucose-Capillary: 218 mg/dL — ABNORMAL HIGH (ref 70–99)
Glucose-Capillary: 227 mg/dL — ABNORMAL HIGH (ref 70–99)
Glucose-Capillary: 282 mg/dL — ABNORMAL HIGH (ref 70–99)
Glucose-Capillary: 290 mg/dL — ABNORMAL HIGH (ref 70–99)
Glucose-Capillary: 239 mg/dL — ABNORMAL HIGH (ref 70–99)

## 2024-04-24 LAB — COMPREHENSIVE METABOLIC PANEL WITH GFR
ALT: 32 U/L (ref 0–44)
AST: 51 U/L — ABNORMAL HIGH (ref 15–41)
Albumin: 2.4 g/dL — ABNORMAL LOW (ref 3.5–5.0)
Alkaline Phosphatase: 87 U/L (ref 38–126)
Anion gap: 9 (ref 5–15)
BUN: 15 mg/dL (ref 8–23)
CO2: 24 mmol/L (ref 22–32)
Calcium: 8.7 mg/dL — ABNORMAL LOW (ref 8.9–10.3)
Chloride: 108 mmol/L (ref 98–111)
Creatinine, Ser: 0.71 mg/dL (ref 0.61–1.24)
GFR, Estimated: >60 mL/min (ref >60)
Glucose, Bld: 198 mg/dL — ABNORMAL HIGH (ref 70–99)
Potassium: 3.4 mmol/L — ABNORMAL LOW (ref 3.5–5.1)
Sodium: 141 mmol/L (ref 135–145)
Total Bilirubin: 1 mg/dL (ref 0.0–1.2)
Total Protein: 6.1 g/dL — ABNORMAL LOW (ref 6.5–8.1)

## 2024-04-24 LAB — CULTURE, BLOOD (ROUTINE X 2)
Culture: NO GROWTH
Special Requests: ADEQUATE

## 2024-04-24 LAB — HEPARIN LEVEL (UNFRACTIONATED): Heparin Unfractionated: 0.39 [IU]/mL (ref 0.30–0.70)

## 2024-04-24 LAB — PHOSPHORUS
Phosphorus: 3 mg/dL (ref 2.5–4.6)
Phosphorus: 2.8 mg/dL (ref 2.5–4.6)

## 2024-04-24 LAB — MAGNESIUM
Magnesium: 2.1 mg/dL (ref 1.7–2.4)
Magnesium: 2 mg/dL (ref 1.7–2.4)

## 2024-04-24 MED ORDER — POTASSIUM CHLORIDE 20 MEQ PO PACK
40.0000 meq | PACK | Freq: Once | ORAL | Status: AC
  Administered 2024-04-24: 40 meq
  Filled 2024-04-24: qty 2

## 2024-04-24 NOTE — Progress Notes (Signed)
 PHARMACY - ANTICOAGULATION CONSULT NOTE  Pharmacy Consult for heparin   Indication: SMV thrombus  Allergies  Allergen Reactions   Lisinopril  Swelling and Other (See Comments)    angioedema   Citalopram Nausea Only   Morphine  Nausea And Vomiting   Nsaids Other (See Comments)    Stomach Irritability    Paroxetine Nausea And Vomiting    Patient Measurements: Height: 5' 7 (170.2 cm) Weight: 65.3 kg (143 lb 15.4 oz) IBW/kg (Calculated) : 66.1 HEPARIN  DW (KG): 65.6  Vital Signs: Temp: 98.5 F (36.9 C) (06/28 0436) Temp Source: Oral (06/28 0436) BP: 149/58 (06/28 0600) Pulse Rate: 91 (06/28 0600)  Labs: Recent Labs    04/22/24 1714 04/22/24 2038 04/23/24 0438 04/24/24 0024 04/24/24 0524  HGB  --   --  11.1* 11.0* 10.5*  HCT  --   --  32.1* 33.8* 31.5*  PLT  --   --  80* 96* 99*  HEPARINUNFRC  --  0.32 0.41  --  0.39  CREATININE 0.82  --  0.75  --  0.71    Estimated Creatinine Clearance: 82.8 mL/min (by C-G formula based on SCr of 0.71 mg/dL).   Medical History: Past Medical History:  Diagnosis Date   Alcohol abuse    Allergic rhinitis    CHF (congestive heart failure) (HCC)    Diabetes mellitus without complication (HCC)    'sometime last yr'--type 2   GERD (gastroesophageal reflux disease)    Hepatitis    he thinks its hep b or c   Hyperlipidemia    Hypertension    Osteoarthritis    Pancreatitis    Rheumatoid arthritis(714.0)    Stroke (HCC)    Tobacco user     Assessment: Patient is a 68 y.o M with hx EtOH abuse and pancreatitis who presented to the ED om 04/19/24 with c/o abdominal pain.   Abdominal CT on 04/19/24 showed findings with concern for necrotizing pancreatitis and thrombus extending from the SMV into the porta splenic confluence and proximal main portal vein.  He is currently on heparin  drip for VTE treatment.   Today, 04/24/2024: Heparin  level = 0.39 remains therapeutic on heparin  infusion of 1350 units/hr CBC: Low but stable Dr. Edrick  note overnight states small amount (<19mL) blood coughed up and suctioned, continue heparin  drip. Confirmed with RN - no additional signs of bleeding this morning.   Goal of Therapy:  Heparin  level 0.3-0.7 units/ml Monitor platelets by anticoagulation protocol: Yes   Plan:  Continue heparin  drip at 1350 units/hr Daily CBC & heparin  level Monitor for signs of bleeding  Ronal CHRISTELLA Rav, PharmD 04/24/24 7:40 AM

## 2024-04-24 NOTE — Progress Notes (Signed)
 NAME:  [name], MRN:  [id number], DOB:  04/19/1956, LOS: 5 ADMISSION DATE:  04/19/2024, CONSULTATION DATE:  6/23 REFERRING MD:  Dr. Dreama , CHIEF COMPLAINT:  Pancreatitis   History of Present Illness:  68 year old male with PMH as below, which is significant for alcohol abuse, RA, pancreatitis, CHF, and DM. He was admitted in February of this year for a total hip arthoplasty for osteoarthritis. Drinks 10 beers per day. The perioperative course was uncomplicated. He now is presenting to [facility] ED 6/23 with complaints of abdominal pain for 3 days. Pain was diffuse, but worse over the right abdominal quadrants. CT of the abdomen demonstrated pancreatitis with necrosis. Lipase 283. CT also noted thrombus extending from the SMV into the portal splenic confluence. He was given IV fluids and started on antibiotics. He was also hypertensive and tachycardic, which was treated effectively with labetalol . GI was consulted by the emergency department and PCCM was asked to admit for severe pancreatitis.   Pertinent  Medical History   has a past medical history of Alcohol abuse, Allergic rhinitis, CHF (congestive heart failure) (HCC), Diabetes mellitus without complication (HCC), GERD (gastroesophageal reflux disease), Hepatitis, Hyperlipidemia, Hypertension, Osteoarthritis, Pancreatitis, Rheumatoid arthritis(714.0), Stroke (HCC), and Tobacco user.  Significant Hospital Events: Including procedures, antibiotic start and stop dates in addition to other pertinent events   6/23 admit for necrotizing pancreatitis 6/24 Hypertensive overnight treated with PRN IV medications, Still having diffuse abdominal pain 6/25 Intermittent episode of confusion/combativeness overnight  6/26 Lightly sedated with bilateral rhonchi, weak cough 6/27 - Sedated this morning with precedex . \Some agitation overnight. Tachypenic but not paradoxical  Interim History / Subjective:   6/28 - More stable. Less tacyepnic.  Friend at bedside. He is conversing with him and friends daughter over phone. AFebrile. WBC coming down. On IV heparin . On HFNC 45% and better. . On TF.  Had some hemotps yesterday -> heprin continued, Pulm toilet advised   Objective    Blood pressure (!) 161/70, pulse 92, temperature 99 F (37.2 C), temperature source Oral, resp. rate (!) 25, height 5' 7 (1.702 m), weight 65.3 kg, SpO2 98%.    FiO2 (%):  [30 %] 30 %   Intake/Output Summary (Last 24 hours) at 04/24/2024 1020 Last data filed at 04/24/2024 0900 Gross per 24 hour  Intake 1967.64 ml  Output 2220 ml  Net -252.36 ml   Filed Weights   04/22/24 0336 04/23/24 0200 04/24/24 0418  Weight: 69.8 kg 65.6 kg 65.3 kg    Examination:   General Appearance:  Looks criticall ill OBESE -  NO but he is cachectic Head:  Normocephalic, without obvious abnormality, atraumatic Eyes:  PERRL - yes, conjunctiva/corneas - muddy     Ears:  Normal external ear canals, both ears Nose:  G tube - YES with HFNC + Throat:  ETT TUBE - no , OG tube - no Neck:  Supple,  No enlargement/tenderness/nodules Lungs: Clear to auscultation bilaterally, RR 28 and MUCH IMPROVED. NOT PARADOICAL Heart:  S1 and S2 normal, no murmur, CVP - no.  Pressors - no Abdomen:  Soft, no masses, no organomegaly Genitalia / Rectal:  Not done Extremities:  Extremities- intac Skin:  ntact in exposed areas . Sacral area - not examined Neurologic:  Sedation - Librium po taper, Nicotine  patch -, dialudid prn> RASS - 0 . Moves all 4s - yes but weak. CAM-ICU - neg . Orientation - x3+      Resolved problem list  Lactic acidosis -Secondary  to pancreatitis  AKI  Assessment and Plan   Acute necrotizing pancreatitis - -Severe pancreatitis. History of alcoholic pancreatitis. Also takes Farxiga  which could possibly be a contributor.   6/28 - afebrile. SIRS better. Pain better. Tolreating Feeds. Last lipase check 125 3d ago and beter. LFT improived  P: Surgery following,  appreciate assistance  Continue trickle tube feeds, advance per surgery and GI Continue IV fluids Multimodal pain control Clinically monitor   Acute hypoxic respiratory failure  -Secondary to sedation in the setting of ETOH withdrawal  -Possible pulmonary edema vs aspiration vs atelectasis from hypoventilation/abd pain. WBC improving.   6/28 -improving with improved pain. Improved tachepnea  P: Supplemental O2 as indicated to keep sats > 945%  (HAS DARK SKIN) Give additional dose of lasix  once his K has been corrected.  Pulmonary hygiene. Unable to perform IS due to somnolence/ETOH wd.  Not a BiPAP candidate   SMV - portosplenic thrombus - -Secondary to pancreatitis. Nonocclusive   6/28  - hemoptsysi transietn overnight   P: Continue heparin  drip  Mnitor  Alcohol withdrawal with history of daily consumption  DTs - -Last drink 6/21  6/28 - doing well without precedex  but just in po librium taper  P: CIWA protocol - ativan  DC preedex off MAR Librium taper via tube to hopefully limit sedative effects of treatment and avoid intubation. Supplement thiamine , folate, and multivitamin   Hyponatremia Hypokalemia P: 40 meq K x1 today Repeat chemistry in the morning.   DM2 with hyperglycemia P: SSI CBG check q4  CBG goal 140-180 Increase semglee  to 12 units daily - received 15 units SSI in the last 24 hours and sugars have been running 160s to 220s.  Home Farxiga  on hold   Chronic HFpEF HTN P: Amlodipine , coreg  per tube (home meds) Continuous telemetry  As needed IV antihypertensives   Rheumatoid arthritis - -Doesn't appear as though he is on any home medications P: Supportive care    Anemai of critical illness  Plan  - - PRBC for hgb </= 6.9gm%    - exceptions are   -  if ACS susepcted/confirmed then transfuse for hgb </= 8.0gm%,  or    -  active bleeding with hemodynamic instability, then transfuse regardless of hemoglobin value   At at all times try  to transfuse 1 unit prbc as possible with exception of active hemorrhage   Best Practice (right click and Reselect all SmartList Selections daily)   Diet/type: NPO but o TF DVT prophylaxis systemic heparin  Pressure ulcer(s): pressure ulcer assessment deferred  GI prophylaxis: PPI Lines: N/A Foley:  N/A Code Status:  full code Last date of multidisciplinary goals of care discussion Pending    Patient and friend updated at [place]  DISPO: Transfer care to [facility] 04/25/24 and CCM off d- w Dr Arlice      ATTESTATION & SIGNATURE   The patient [name] is critically ill with multiple organ systems failure and requires high complexity decision making for assessment and support, frequent evaluation and titration of therapies, application of advanced monitoring technologies and extensive interpretation of multiple databases and discussion with other appropriate health care personnel such as bedside nurses, social workers, case Production designer, theatre/television/film, consultants, respiratory therapists, nutritionists, secretaries etc.,  Critical care time includes but is not restricted to just documentation time. Documentation can happen in parallel or sequential to care time depending on case mix urgency and priorities for the shift. So, overall critical Care Time devoted to patient care services described in this  note is  35  Minutes.   This time reflects time of care of this signee Dr Dorethia Cave which includ does not reflect procedure time, or teaching time or supervisory time of PA/NP/Med student/Med Resident etc but could involve care discussion time     Dr. Dorethia Cave, M.D., [facility].C.P Pulmonary and Critical Care Medicine Staff Physician, [facility] Pager: [phone], If no answer or between  15:00h - 7:00h: call [phone]  04/24/2024 10:20 AM    LABS    PULMONARY Recent Labs  Lab 04/19/24 1420 04/19/24 1439  HCO3  --  15.3*  TCO2 17*   --   O2SAT  --  84.9    CBC Recent Labs  Lab 04/23/24 0438 04/24/24 0024 04/24/24 0524  HGB 11.1* 11.0* 10.5*  HCT 32.1* 33.8* 31.5*  WBC 12.3* 12.3* 12.2*  PLT 80* 96* 99*    COAGULATION Recent Labs  Lab 04/19/24 1331  INR 1.1    CARDIAC  No results for input(s): TROPONINI in the last 168 hours. No results for input(s): PROBNP in the last 168 hours.   CHEMISTRY Recent Labs  Lab 04/21/24 0334 04/21/24 1823 04/22/24 0444 04/22/24 1714 04/23/24 0438 04/23/24 1647 04/24/24 0524  NA 135  --  136 136 137  --  141  K 4.0  --  3.1* 3.9 3.2*  --  3.4*  CL 103  --  103 104 104  --  108  CO2 19*  --  23 23 25   --  24  GLUCOSE 149*  --  202* 239* 171*  --  198*  BUN 24*  --  20 17 16   --  15  CREATININE 1.11  --  0.76 0.82 0.75  --  0.71  CALCIUM  8.4*  --  8.3* 8.5* 8.3*  --  8.7*  MG 2.4   < > 2.4 2.2 2.1 2.1 2.1  PHOS 2.6   < > 1.7* 1.6* 3.2 2.5 3.0   < > = values in this interval not displayed.   Estimated Creatinine Clearance: 82.8 mL/min (by C-G formula based on SCr of 0.71 mg/dL).   LIVER Recent Labs  Lab 04/19/24 1331 04/19/24 1532 04/20/24 0143 04/21/24 0334 04/22/24 0444 04/23/24 0438 04/24/24 0524  AST  --    < > 59* 40 55* 62* 51*  ALT  --    < > 45* 28 29 30  32  ALKPHOS  --    < > 58 48 51 64 87  BILITOT  --    < > 2.4* 2.6* 2.4* 1.6* 1.0  PROT  --    < > 6.3* 6.2* 6.2* 5.9* 6.1*  ALBUMIN   --    < > 2.8* 2.8* 2.4* 2.3* 2.4*  INR 1.1  --   --   --   --   --   --    < > = values in this interval not displayed.     INFECTIOUS Recent Labs  Lab 04/19/24 1420 04/19/24 1654 04/19/24 1923  LATICACIDVEN 6.9* 7.6* 3.9*     ENDOCRINE CBG (last 3)  Recent Labs    04/23/24 2344 04/24/24 0413 04/24/24 0754  GLUCAP 204* 218* 227*         IMAGING x48h  - image(s) personally visualized  -   highlighted in bold DG Chest Port 1 View Result Date: 04/23/2024 CLINICAL DATA:  Respiratory distress EXAM: PORTABLE CHEST 1 VIEW  COMPARISON:  10:36  a.m. FINDINGS: Lung volumes are small, but are stable since prior examination. Small bilateral pleural effusions, right greater than left, are again seen with associated bibasilar compressive atelectasis. Superimposed perihilar interstitial pulmonary infiltrate is present most in keeping with mild interstitial pulmonary edema. No pneumothorax. Cardiac size within normal limits. Nasoenteric feeding tube extends into the upper abdomen beyond the margin of the examination. IMPRESSION: 1. Stable pulmonary hypoinflation. 2. Stable small bilateral pleural effusions, right greater than left. 3. Stable mild interstitial pulmonary edema. Electronically Signed   By: Dorethia Molt M.D.   On: 04/23/2024 00:06   DG CHEST PORT 1 VIEW Result Date: 04/22/2024 CLINICAL DATA:  Hypoxia EXAM: PORTABLE CHEST 1 VIEW COMPARISON:  04/19/2024 FINDINGS: Enteric tube tip incompletely visualized but seen to the level of proximal duodenum. Hypoventilatory changes. Cardiomegaly with interval vascular congestion and probable pleural effusions. Bibasilar airspace disease. IMPRESSION: Cardiomegaly with interval vascular congestion and probable pleural effusions. Bibasilar airspace disease may be due to atelectasis or pneumonia. Electronically Signed   By: Luke Bun M.D.   On: 04/22/2024 14:28

## 2024-04-24 NOTE — Progress Notes (Signed)
 Patient began to cough up medium amount of bright red blood. CCM was made aware and instructed to stop heparin .   Norleen JAYSON Fila

## 2024-04-24 NOTE — Plan of Care (Signed)
  Problem: Clinical Measurements: Goal: Respiratory complications will improve Outcome: Not Progressing Goal: Cardiovascular complication will be avoided Outcome: Not Progressing   Problem: Activity: Goal: Risk for activity intolerance will decrease Outcome: Not Progressing   Problem: Elimination: Goal: Will not experience complications related to bowel motility Outcome: Not Progressing   Problem: Pain Managment: Goal: General experience of comfort will improve and/or be controlled Outcome: Not Progressing

## 2024-04-25 ENCOUNTER — Inpatient Hospital Stay (HOSPITAL_COMMUNITY)

## 2024-04-25 DIAGNOSIS — J9601 Acute respiratory failure with hypoxia: Secondary | ICD-10-CM | POA: Diagnosis not present

## 2024-04-25 DIAGNOSIS — K8521 Alcohol induced acute pancreatitis with uninfected necrosis: Secondary | ICD-10-CM

## 2024-04-25 DIAGNOSIS — D649 Anemia, unspecified: Secondary | ICD-10-CM | POA: Diagnosis not present

## 2024-04-25 DIAGNOSIS — R042 Hemoptysis: Secondary | ICD-10-CM | POA: Diagnosis not present

## 2024-04-25 LAB — CBC
HCT: 33.1 % — ABNORMAL LOW (ref 39.0–52.0)
Hemoglobin: 10.6 g/dL — ABNORMAL LOW (ref 13.0–17.0)
MCH: 33.5 pg (ref 26.0–34.0)
MCHC: 32 g/dL (ref 30.0–36.0)
MCV: 104.7 fL — ABNORMAL HIGH (ref 80.0–100.0)
Platelets: 139 10*3/uL — ABNORMAL LOW (ref 150–400)
RBC: 3.16 MIL/uL — ABNORMAL LOW (ref 4.22–5.81)
RDW: 14.1 % (ref 11.5–15.5)
WBC: 15.5 10*3/uL — ABNORMAL HIGH (ref 4.0–10.5)
nRBC: 0.6 % — ABNORMAL HIGH (ref 0.0–0.2)

## 2024-04-25 LAB — GLUCOSE, CAPILLARY
Glucose-Capillary: 208 mg/dL — ABNORMAL HIGH (ref 70–99)
Glucose-Capillary: 246 mg/dL — ABNORMAL HIGH (ref 70–99)
Glucose-Capillary: 247 mg/dL — ABNORMAL HIGH (ref 70–99)
Glucose-Capillary: 269 mg/dL — ABNORMAL HIGH (ref 70–99)
Glucose-Capillary: 292 mg/dL — ABNORMAL HIGH (ref 70–99)
Glucose-Capillary: 301 mg/dL — ABNORMAL HIGH (ref 70–99)
Glucose-Capillary: 307 mg/dL — ABNORMAL HIGH (ref 70–99)

## 2024-04-25 LAB — BASIC METABOLIC PANEL WITH GFR
Anion gap: 10 (ref 5–15)
BUN: 17 mg/dL (ref 8–23)
CO2: 24 mmol/L (ref 22–32)
Calcium: 8.8 mg/dL — ABNORMAL LOW (ref 8.9–10.3)
Chloride: 105 mmol/L (ref 98–111)
Creatinine, Ser: 0.78 mg/dL (ref 0.61–1.24)
GFR, Estimated: >60 mL/min (ref >60)
Glucose, Bld: 302 mg/dL — ABNORMAL HIGH (ref 70–99)
Potassium: 3.9 mmol/L (ref 3.5–5.1)
Sodium: 139 mmol/L (ref 135–145)

## 2024-04-25 LAB — PHOSPHORUS: Phosphorus: 3.7 mg/dL (ref 2.5–4.6)

## 2024-04-25 LAB — MAGNESIUM: Magnesium: 1.9 mg/dL (ref 1.7–2.4)

## 2024-04-25 LAB — HEPATIC FUNCTION PANEL
ALT: 39 U/L (ref 0–44)
AST: 47 U/L — ABNORMAL HIGH (ref 15–41)
Albumin: 2.5 g/dL — ABNORMAL LOW (ref 3.5–5.0)
Alkaline Phosphatase: 93 U/L (ref 38–126)
Bilirubin, Direct: 0.3 mg/dL — ABNORMAL HIGH (ref 0.0–0.2)
Indirect Bilirubin: 1 mg/dL — ABNORMAL HIGH (ref 0.3–0.9)
Total Bilirubin: 1.3 mg/dL — ABNORMAL HIGH (ref 0.0–1.2)
Total Protein: 6.4 g/dL — ABNORMAL LOW (ref 6.5–8.1)

## 2024-04-25 MED ORDER — MAGNESIUM SULFATE 2 GM/50ML IV SOLN
2.0000 g | Freq: Once | INTRAVENOUS | Status: AC
  Administered 2024-04-25: 2 g via INTRAVENOUS
  Filled 2024-04-25: qty 50

## 2024-04-25 MED ORDER — TRANEXAMIC ACID FOR INHALATION
500.0000 mg | Freq: Three times a day (TID) | RESPIRATORY_TRACT | Status: AC
  Administered 2024-04-25: 500 mg via RESPIRATORY_TRACT
  Filled 2024-04-25 (×5): qty 10

## 2024-04-25 MED ORDER — POTASSIUM CHLORIDE CRYS ER 20 MEQ PO TBCR
20.0000 meq | EXTENDED_RELEASE_TABLET | Freq: Once | ORAL | Status: AC
  Administered 2024-04-25: 20 meq via ORAL
  Filled 2024-04-25: qty 1

## 2024-04-25 MED ORDER — FUROSEMIDE 10 MG/ML IJ SOLN
40.0000 mg | Freq: Two times a day (BID) | INTRAMUSCULAR | Status: DC
  Administered 2024-04-25 – 2024-04-27 (×5): 40 mg via INTRAVENOUS
  Filled 2024-04-25 (×5): qty 4

## 2024-04-25 NOTE — Progress Notes (Signed)
 NAME:  [name], MRN:  [id number], DOB:  05/21/1956, LOS: 5 ADMISSION DATE:  04/19/2024, CONSULTATION DATE:  6/23 REFERRING MD:  Dr. Dreama , CHIEF COMPLAINT:  Pancreatitis   History of Present Illness:  68 year old male with PMH as below, which is significant for alcohol abuse, RA, pancreatitis, CHF, and DM. He was admitted in February of this year for a total hip arthoplasty for osteoarthritis. Drinks 10 beers per day. The perioperative course was uncomplicated. He now is presenting to [facility] ED 6/23 with complaints of abdominal pain for 3 days. Pain was diffuse, but worse over the right abdominal quadrants. CT of the abdomen demonstrated pancreatitis with necrosis. Lipase 283. CT also noted thrombus extending from the SMV into the portal splenic confluence. He was given IV fluids and started on antibiotics. He was also hypertensive and tachycardic, which was treated effectively with labetalol . GI was consulted by the emergency department and PCCM was asked to admit for severe pancreatitis.   Pertinent  Medical History   has a past medical history of Alcohol abuse, Allergic rhinitis, CHF (congestive heart failure) (HCC), Diabetes mellitus without complication (HCC), GERD (gastroesophageal reflux disease), Hepatitis, Hyperlipidemia, Hypertension, Osteoarthritis, Pancreatitis, Rheumatoid arthritis(714.0), Stroke (HCC), and Tobacco user.  Significant Hospital Events: Including procedures, antibiotic start and stop dates in addition to other pertinent events   6/23 admit for necrotizing pancreatitis 6/24 Hypertensive overnight treated with PRN IV medications, Still having diffuse abdominal pain 6/25 Intermittent episode of confusion/combativeness overnight  6/26 Lightly sedated with bilateral rhonchi, weak cough 6/27 - Sedated this morning with precedex . \Some agitation overnight. Tachypenic but not paradoxica 6/28 - More stable. Less tacyepnic. Friend at bedside. He is conversing  with him and friends daughter over phone. AFebrile. WBC coming down. On IV heparin . On HFNC 45% and better. . On TF.  Had some hemotps yesterday -> heprin continued, Pulm toilet advised  Interim History / Subjective:    6/29 -post morning rounds yesterday had hemoptysis and we had to hold IV heparin .  Currently on high flow nasal cannula at 4 L/min FiO2 30%.  Is an ongoing tachypnea intermittently but associated with pancreatic pain which always improves with Dilaudid .  Despite high oxygen needs chest x-ray looks fairly clear except for lower lobe atelectasis/effusion.   He still has intermittent pain but he says he is feeling stronger.  He is looking forward to going home. He says he will never drink ETOH again Asking for opipids  Objective    Vitals:   04/25/24 0750 04/25/24 0800 04/25/24 0900 04/25/24 1000  BP: (!) 168/57 (!) 161/67 (!) 139/50 (!) 171/54  Pulse: 78 82 77 78  Resp: 18 (!) 23 (!) 21 20  Temp:  99.4 F (37.4 C)    TempSrc:  Axillary    SpO2: 99% 97% 98% 99%  Weight:      Height:        I/O last 3 completed shifts: In: 1285.8 [I.V.:188.3; NG/GT:1097.5] Out: 1320 [Urine:1320]    Examination:   General Appearance:  Looks  Scientist, product/process development.  Today is the best he has looked in 3 days Head:  Normocephalic, without obvious abnormality, atraumatic Eyes:  PERRL - yes, conjunctiva/corneas - mudd     Ears:  Normal external ear canals, both ears Nose:  G tube - FEEDING TUBE With TF but also has high flow nasal cannula. Throat:  ETT TUBE - no , OG tube - no Neck:  Supple,  No enlargement/tenderness/nodules Lungs: Clear to auscultation  bilaterally. NOT TACHYEPNIC.,  On o2 Heart:  S1 and S2 normal, no murmur, CVP - no.  Pressors - no Abdomen:  Soft, no masses, no organomegaly Genitalia / Rectal:  Not done Extremities:  Extremities- intact Skin:  ntact in exposed areas . Sacral area - not examined Neurologic:  Sedation -Dialuaid PRN and Librium taper -> RASS - +! SABRA Moves  all 4s - y3s. CAM-ICU - neg . Orientation - x3+       Resolved problem list  Lactic acidosis -Secondary to pancreatitis  AKI  Assessment and Plan   Acute necrotizing pancreatitis - -Severe pancreatitis. History of alcoholic pancreatitis. Also takes Farxiga  which could possibly be a contributor.   6/29  - afebrile. SIRS better. Pain better. Still NPO but on TF   P: Surgery following, appreciate assistance  Continue trickle tube feeds, advance per surgery and GI TRy ORAL CLEARS Continue IV fluids Multimodal pain control Clinically monitor   Acute hypoxic respiratory failure  -Secondary to sedation in the setting of ETOH withdrawal  -Possible pulmonary edema vs aspiration vs atelectasis from hypoventilation/abd pain. WBC improving.   6/29 I mproved . BUT STILL ON HFNC. CX with LL ATELECTASIS  P: Get CT CHEST Supplemental O2 as indicated to keep sats > 945%  (HAS DARK SKIN) Pulmonary hygiene. Unable to perform IS due to somnolence/ETOH wd.    HEMOPTYSIS  new 04/24/24 and IV heparin  (see beloww) held  Plan  - Get CT chest wo contrast   SMV - portosplenic thrombus - -Secondary to pancreatitis. Nonocclusive. On IV HEPARIN  this admit   6/29 - IV Heparin  gtt on  hold since prior day due to NEW HEMOPTYSIS  P: Monitor OFF heparin  gtt Get CT chest without contast Mnitor  Alcohol withdrawal with history of daily consumption - s/p precedex  this admit  DTs - -Last drink 6/21  6/29 - doing well without precedex  but just in po librium taper  P: CIWA protocol - ativan  Librium taper via tube to hopefully limit sedative effects of treatment and avoid intubation. Supplement thiamine , folate, and multivitamin    Hypokalemia < 4 Low mag < 2  P: 40 meq K x1 today IV mag sulafte 6/29 Rest per [facility]   DM2 with hyperglycemia P: Per [facility]  Chronic HFpEF HTN P: Per [facility]   Rheumatoid arthritis - -Doesn't appear as though he is on any home  medications P: Supportive care    Anemai of critical illness  - stable  Plan  - - PRBC for hgb </= 6.9gm%    - exceptions are   -  if ACS susepcted/confirmed then transfuse for hgb </= 8.0gm%,  or    -  active bleeding with hemodynamic instability, then transfuse regardless of hemoglobin value   At at all times try to transfuse 1 unit prbc as possible with exception of active hemorrhage   Best Practice (right click and Reselect all SmartList Selections daily)   Diet/type: NPO but o TF. START CLEARS 04/25/24 DVT  - Hepain gtt hold6/28/25 onwards due to hemoptysis Pressure ulcer(s): pressure ulcer assessment deferred  GI prophylaxis: PPI Lines: N/A Foley:  N/A Code Status:  full code Last date of multidisciplinary goals of care discussion Pending    Patient updated at bedside 04/25/24  CCM consult for pulmonary isssues      ATTESTATION & SIGNATURE    Dr. Dorethia Cave, M.D., [facility].C.P Pulmonary and Critical Care Medicine Staff Physician, [facility] [place] Pulmonary and Critical Care Pager: [phone],  If no answer or between  15:00h - 7:00h: call [phone]  04/24/2024 10:20 AM    LABS    PULMONARY Recent Labs  Lab 04/19/24 1420 04/19/24 1439  HCO3  --  15.3*  TCO2 17*  --   O2SAT  --  84.9    CBC Recent Labs  Lab 04/24/24 0024 04/24/24 0524 04/25/24 0513  HGB 11.0* 10.5* 10.6*  HCT 33.8* 31.5* 33.1*  WBC 12.3* 12.2* 15.5*  PLT 96* 99* 139*    COAGULATION Recent Labs  Lab 04/19/24 1331  INR 1.1    CARDIAC  No results for input(s): TROPONINI in the last 168 hours. No results for input(s): PROBNP in the last 168 hours.   CHEMISTRY Recent Labs  Lab 04/22/24 0444 04/22/24 1714 04/23/24 0438 04/23/24 1647 04/24/24 0524 04/24/24 1712 04/25/24 0513  NA 136 136 137  --  141  --  139  K 3.1* 3.9 3.2*  --  3.4*  --  3.9  CL 103 104 104  --  108  --  105  CO2 23 23 25   --  24  --  24  GLUCOSE 202* 239* 171*   --  198*  --  302*  BUN 20 17 16   --  15  --  17  CREATININE 0.76 0.82 0.75  --  0.71  --  0.78  CALCIUM  8.3* 8.5* 8.3*  --  8.7*  --  8.8*  MG 2.4 2.2 2.1 2.1 2.1 2.0 1.9  PHOS 1.7* 1.6* 3.2 2.5 3.0 2.8 3.7   Estimated Creatinine Clearance: 82.8 mL/min (by C-G formula based on SCr of 0.78 mg/dL).   LIVER Recent Labs  Lab 04/19/24 1331 04/19/24 1532 04/21/24 0334 04/22/24 0444 04/23/24 0438 04/24/24 0524 04/25/24 0513  AST  --    < > 40 55* 62* 51* 47*  ALT  --    < > 28 29 30  32 39  ALKPHOS  --    < > 48 51 64 87 93  BILITOT  --    < > 2.6* 2.4* 1.6* 1.0 1.3*  PROT  --    < > 6.2* 6.2* 5.9* 6.1* 6.4*  ALBUMIN   --    < > 2.8* 2.4* 2.3* 2.4* 2.5*  INR 1.1  --   --   --   --   --   --    < > = values in this interval not displayed.     INFECTIOUS Recent Labs  Lab 04/19/24 1420 04/19/24 1654 04/19/24 1923  LATICACIDVEN 6.9* 7.6* 3.9*     ENDOCRINE CBG (last 3)  Recent Labs    04/25/24 0010 04/25/24 0402 04/25/24 0742  GLUCAP 208* 247* 301*         IMAGING x48h  - image(s) personally visualized  -   highlighted in bold DG CHEST PORT 1 VIEW Result Date: 04/25/2024 CLINICAL DATA:  36304 Hypoxemia 36304 EXAM: PORTABLE CHEST - 1 VIEW COMPARISON:  04/22/2024 FINDINGS: Feeding tube extends at least as far stomach, tip not seen. Small bilateral effusions with patchy atelectasis in the lung bases, improved on the right since previous. Heart size and mediastinal contours are within normal limits. No pneumothorax. Right shoulder DJD. IMPRESSION: Small bilateral effusions with bibasilar atelectasis, improved on the right. Electronically Signed   By: JONETTA Faes M.D.   On: 04/25/2024 09:40   6/6 GBS culture 6 steps at [facility] testing as needed.  Is aware no done this Wednesday (radius  of yesterday's ill 20 pounds and there is a tendency Clinical cytogeneticist.  Tessalon  [place] 20 yeah there is the tunnel to the [place] [address] the staff is being last year which does not crossover into 1  sorry I think the [place] once his RA

## 2024-04-25 NOTE — Progress Notes (Signed)
 CT resuls      IMPRESSION: 1. Small pleural effusions with extensive consolidation/atelectasis in the lung bases. 2. Coronary artery disease. 3. Acute pancreatitis, incompletely visualized. 4.  Emphysema (ICD10-J43.9).     Electronically Signed   By: JONETTA Faes M.D.   On: 04/25/2024 16:29    Hemoptysis likely from [place]  Plan  Will give TXA x 24h Restart IV heparin  04/26/24     SIGNATURE    Dr. Dorethia Cave, M.D., F.C.C.P,  Pulmonary and Critical Care Medicine Staff Physician, [facility] Director - Interstitial Lung Disease  Program  Pulmonary Fibrosis [facility] at [facility] [place], [place], [address]   Pager: [phone], If no answer  -> Check AMION or Try [phone] Telephone (clinical office): [phone] Telephone (research): [phone]  6:32 PM 04/25/2024

## 2024-04-25 NOTE — Progress Notes (Addendum)
 PROGRESS NOTE  VEGAS FRITZE FMW:[id number] DOB: [birth year] DOA: 04/19/2024 PCP: [name], NP   LOS: 6 days   Brief Narrative / Interim history: 68 year old male with history of DM2, HTN, HLD, rheumatoid arthritis, prior CVA, tobacco and alcohol use comes into the hospital and is admitted on 6/23 with abdominal pain, found to have necrotizing pancreatitis.  Hospital course complicated by persistent abdominal pain requiring NG tube placement for feeding, also hypoxic respiratory failure requiring high flow oxygen.  He was also found to have a thrombus extending from the SMV and the portal splenic confluence.  Has been placed on anticoagulation.  GI consulted early on but they signed off.  Surgery also consulted due to necrotizing pancreatitis  Subjective / 24h Interval events: He reports ongoing abdominal pain but feeling a little bit better.  68 year old male Principal Problem:   Pancreatitis, alcoholic, acute Active Problems:   Malnutrition of moderate degree   Principal problem Acute necrotizing pancreatitis, EtOH induced -imaging on admission showed acute pancreatitis with findings suggestive of necrotizing pancreatitis.  With no abscess or pseudocyst were seen.  GI consulted, now signed off.  General surgery also consulted, no indication for surgery. - Continue supportive care, has been getting feeds via NG tube, given improvement in his pain will allow clears today and see how he does  Active problems Acute hypoxic respiratory failure-quite profound, he is on 40 L heated high flow.  Chest x-ray does not show any significant ARDS, there is a discrepancy between how the chest x-ray looks and how much oxygen he needs.  Discussed with PCCM, obtain CT of the chest today - Had an episode of hemoptysis, transient.  PCCM following  SMV portal splenic thrombus-continue anticoagulation.  This is likely due to pancreatitis.  Thrombus is nonocclusive.  Essential  hypertension-pretty hypertensive on admission, has been placed on amlodipine , Coreg .  Continue  EtOH use-no withdrawals noted, continue Librium taper  Hypokalemia-replace potassium and continue to monitor  Chronic diastolic CHF-now being diuresed with furosemide   RA-does not appear to be on any medications at home  Anemia of critical illness-hemoglobin stable, no bleeding  Thrombocytopenia-in the setting of EtOH use  DM2, with hyperglycemia-continue insulin   Lab Results  Component Value Date   HGBA1C 6.1 (H) 04/02/2024   CBG (last 3)  Recent Labs    04/25/24 0010 04/25/24 0402 04/25/24 0742  GLUCAP 208* 247* 301*    Scheduled Meds:  amLODipine   10 mg Per J Tube Daily   carvedilol   12.5 mg Per Tube BID WC   chlordiazePOXIDE  25 mg Per Tube TID   Followed by   chlordiazePOXIDE  25 mg Per Tube BH-qamhs   Followed by   NOREEN ON 04/26/2024] chlordiazePOXIDE  25 mg Per Tube Daily   Chlorhexidine  Gluconate Cloth  6 each Topical Daily   feeding supplement (PROSource TF20)  60 mL Per Tube Daily   folic acid   1 mg Intravenous Daily   furosemide   40 mg Intravenous Q12H   insulin  aspart  0-9 Units Subcutaneous Q4H   insulin  aspart  2 Units Subcutaneous Q4H   insulin  glargine-yfgn  12 Units Subcutaneous Daily   multivitamin  15 mL Per Tube Daily   nicotine   21 mg Transdermal Daily   thiamine   100 mg Per Tube Daily   Or   thiamine   100 mg Intravenous Daily   Continuous Infusions:  feeding supplement (OSMOLITE 1.5 CAL) 55 mL/hr at 04/25/24 0838   PRN Meds:.docusate, hydrALAZINE , HYDROmorphone  (DILAUDID ) injection, labetalol ,  ondansetron  (ZOFRAN ) IV, mouth rinse, polyethylene glycol  Current Outpatient Medications  Medication Instructions   amLODipine  (NORVASC ) 10 mg, Oral, Daily   atorvastatin  (LIPITOR) 40 mg, Daily   carvedilol  (COREG  CR) 80 mg, Daily   cetirizine  (ZYRTEC ) 10 mg, Oral, Daily   clopidogrel  (PLAVIX ) 75 mg, Oral, Daily   dapagliflozin  propanediol  (FARXIGA ) 10 mg, Oral, Daily before breakfast   fenofibrate micronized (LOFIBRA) 134 mg, Every evening   lidocaine  (LIDODERM ) 5 % 1 patch, Transdermal, Every 24 hours, Apply 1 patch to area of pain for up to 12 hours.  Then remove patch for full 12 hours before reapplying a new patch.   metFORMIN  (GLUCOPHAGE ) 500 mg, Oral, Daily with breakfast   metoprolol  tartrate (LOPRESSOR ) 50 mg, Oral, 2 times daily, Take one two times a day for blood pressure.   oxyCODONE -acetaminophen  (PERCOCET) 10-325 MG tablet 1 tablet, 5 times daily   pantoprazole  (PROTONIX ) 40 mg, 2 times daily   rivaroxaban  (XARELTO ) 10 mg, Oral, Daily, For 30 days to prevent blood clots after surgery   sennosides-docusate sodium  (SENOKOT-S) 8.6-50 MG tablet 2 tablets, Oral, Daily PRN   tamsulosin  (FLOMAX ) 0.8 mg, Oral, Daily   traZODone  (DESYREL ) 100 mg, Oral, Daily at bedtime, Take one table by mouth at bedtime as needed for sleep.    Diet Orders (From admission, onward)     Start     Ordered   04/25/24 1022  Diet clear liquid Room service appropriate? Yes; Fluid consistency: Thin  Diet effective now       Question Answer Comment  Room service appropriate? Yes   Fluid consistency: Thin      04/25/24 1021            DVT prophylaxis:    Lab Results  Component Value Date   PLT 139 (L) 04/25/2024      Code Status: Full Code  Family Communication: no family at bedside   Status is: Inpatient Remains inpatient appropriate because: severity of illness  Level of care: ICU  Consultants:  PCCM GI Surgery   Objective: Vitals:   04/25/24 0750 04/25/24 0800 04/25/24 0900 04/25/24 1000  BP: (!) 168/57 (!) 161/67 (!) 139/50 (!) 171/54  Pulse: 78 82 77 78  Resp: 18 (!) 23 (!) 21 20  Temp:  99.4 F (37.4 C)    TempSrc:  Axillary    SpO2: 99% 97% 98% 99%  Weight:      Height:        Intake/Output Summary (Last 24 hours) at 04/25/2024 1025 Last data filed at 04/25/2024 9161 Gross per 24 hour  Intake 1389.83  ml  Output 1300 ml  Net 89.83 ml   Wt Readings from Last 3 Encounters:  04/25/24 65.3 kg  04/02/24 66.7 kg  12/02/23 70.8 kg    Examination:  Constitutional: NAD Eyes: no scleral icterus ENMT: Mucous membranes are moist.  Neck: normal, supple Respiratory: No wheezing, overall clear Cardiovascular: Regular rate and rhythm, no murmurs / rubs / gallops. No LE edema.  Abdomen: diffuse tenderness, no guarding or rebound Musculoskeletal: no clubbing / cyanosis.  Skin: no rashes Neurologic: non focal   Data Reviewed: I have independently reviewed following labs and imaging studies   CBC Recent Labs  Lab 04/22/24 0444 04/23/24 0438 04/24/24 0024 04/24/24 0524 04/25/24 0513  WBC 14.8* 12.3* 12.3* 12.2* 15.5*  HGB 12.7* 11.1* 11.0* 10.5* 10.6*  HCT 37.4* 32.1* 33.8* 31.5* 33.1*  PLT 82* 80* 96* 99* 139*  MCV 98.4 97.9 102.1* 100.6* 104.7*  MCH 33.4 33.8 33.2 33.5 33.5  MCHC 34.0 34.6 32.5 33.3 32.0  RDW 14.2 14.0 14.1 14.4 14.1  LYMPHSABS  --   --  2.3  --   --   MONOABS  --   --  1.6*  --   --   EOSABS  --   --  0.0  --   --   BASOSABS  --   --  0.0  --   --     Recent Labs  Lab 04/19/24 1331 04/19/24 1420 04/19/24 1532 04/19/24 1654 04/19/24 1923 04/20/24 0143 04/21/24 0334 04/21/24 1823 04/22/24 0444 04/22/24 1714 04/23/24 0438 04/23/24 1647 04/24/24 0524 04/24/24 1712 04/25/24 0513  NA  --  127*   < >  --   --  130* 135  --  136 136 137  --  141  --  139  K  --  6.2*   < >  --   --  4.2 4.0  --  3.1* 3.9 3.2*  --  3.4*  --  3.9  CL  --  99   < >  --   --  101 103  --  103 104 104  --  108  --  105  CO2  --   --    < >  --   --  17* 19*  --  23 23 25   --  24  --  24  GLUCOSE  --  377*   < >  --   --  152* 149*  --  202* 239* 171*  --  198*  --  302*  BUN  --  18   < >  --   --  21 24*  --  20 17 16   --  15  --  17  CREATININE  --  1.50*   < >  --   --  1.57* 1.11  --  0.76 0.82 0.75  --  0.71  --  0.78  CALCIUM   --   --    < >  --   --  8.1* 8.4*  --   8.3* 8.5* 8.3*  --  8.7*  --  8.8*  AST  --   --    < >  --   --  59* 40  --  55*  --  62*  --  51*  --  47*  ALT  --   --    < >  --   --  45* 28  --  29  --  30  --  32  --  39  ALKPHOS  --   --    < >  --   --  58 48  --  51  --  64  --  87  --  93  BILITOT  --   --    < >  --   --  2.4* 2.6*  --  2.4*  --  1.6*  --  1.0  --  1.3*  ALBUMIN   --   --    < >  --   --  2.8* 2.8*  --  2.4*  --  2.3*  --  2.4*  --  2.5*  MG  --   --    < >  --   --  1.8 2.4   < > 2.4 2.2 2.1 2.1 2.1 2.0 1.9  CRP  --   --   --   --   --  23.5*  --   --   --   --   --   --   --   --   --   LATICACIDVEN  --  6.9*  --  7.6* 3.9*  --   --   --   --   --   --   --   --   --   --   INR 1.1  --   --   --   --   --   --   --   --   --   --   --   --   --   --    < > = values in this interval not displayed.    ------------------------------------------------------------------------------------------------------------------ No results for input(s): CHOL, HDL, LDLCALC, TRIG, CHOLHDL, LDLDIRECT in the last 72 hours.  Lab Results  Component Value Date   HGBA1C 6.1 (H) 04/02/2024   ------------------------------------------------------------------------------------------------------------------ No results for input(s): TSH, T4TOTAL, T3FREE, THYROIDAB in the last 72 hours.  Invalid input(s): FREET3  Cardiac Enzymes No results for input(s): CKMB, TROPONINI, MYOGLOBIN in the last 168 hours.  Invalid input(s): CK ------------------------------------------------------------------------------------------------------------------ No results found for: BNP  CBG: Recent Labs  Lab 04/24/24 1532 04/24/24 1943 04/25/24 0010 04/25/24 0402 04/25/24 0742  GLUCAP 290* 239* 208* 247* 301*    Recent Results (from the past 240 hours)  Blood Culture (routine x 2)     Status: None   Collection Time: 04/19/24  1:45 PM   Specimen: BLOOD LEFT ARM  Result Value Ref Range Status   Specimen  Description   Final    BLOOD LEFT ARM Performed at [facility], [address], [place], [place] [address]    Special Requests   Final    BOTTLES DRAWN AEROBIC AND ANAEROBIC Blood Culture adequate volume Performed at [facility], [address], [place], [place] [address]    Culture   Final    NO GROWTH 5 DAYS Performed at [facility], [address] [place], [place] [address]    Report Status 04/24/2024 FINAL  Final  Resp panel by RT-PCR (RSV, Flu A&B, Covid) Anterior Nasal Swab     Status: None   Collection Time: 04/19/24  2:49 PM   Specimen: Anterior Nasal Swab  Result Value Ref Range Status   SARS Coronavirus 2 by RT PCR NEGATIVE NEGATIVE Final    Comment: (NOTE) SARS-CoV-2 target nucleic acids are NOT DETECTED.  The SARS-CoV-2 RNA is generally detectable in upper respiratory specimens during the acute phase of infection. The lowest concentration of SARS-CoV-2 viral copies this assay can detect is 138 copies/mL. A negative result does not preclude SARS-Cov-2 infection and should not be used as the sole basis for treatment or other patient management decisions. A negative result may occur with  improper specimen collection/handling, submission of specimen other than nasopharyngeal swab, presence of viral mutation(s) within the areas targeted by this assay, and inadequate number of viral copies(<138 copies/mL). A negative result must be combined with clinical observations, patient history, and epidemiological information. The expected result is Negative.  Fact Sheet for Patients:  BloggerCourse.com  Fact Sheet for Healthcare Providers:  SeriousBroker.it  This test is no t yet approved or cleared by the [place]  FDA and  has been authorized for detection and/or diagnosis of SARS-CoV-2 by FDA under an Emergency Use Authorization (EUA). This EUA will remain  in effect (meaning this test  can be used) for the duration of  the COVID-19 declaration under Section 564(b)(1) of the Act, 21 U.S.C.section 360bbb-3(b)(1), unless the authorization is terminated  or revoked sooner.       Influenza A by PCR NEGATIVE NEGATIVE Final   Influenza B by PCR NEGATIVE NEGATIVE Final    Comment: (NOTE) The Xpert Xpress SARS-CoV-2/FLU/RSV plus assay is intended as an aid in the diagnosis of influenza from Nasopharyngeal swab specimens and should not be used as a sole basis for treatment. Nasal washings and aspirates are unacceptable for Xpert Xpress SARS-CoV-2/FLU/RSV testing.  Fact Sheet for Patients: BloggerCourse.com  Fact Sheet for Healthcare Providers: SeriousBroker.it  This test is not yet approved or cleared by the [place]  FDA and has been authorized for detection and/or diagnosis of SARS-CoV-2 by FDA under an Emergency Use Authorization (EUA). This EUA will remain in effect (meaning this test can be used) for the duration of the COVID-19 declaration under Section 564(b)(1) of the Act, 21 U.S.C. section 360bbb-3(b)(1), unless the authorization is terminated or revoked.     Resp Syncytial Virus by PCR NEGATIVE NEGATIVE Final    Comment: (NOTE) Fact Sheet for Patients: BloggerCourse.com  Fact Sheet for Healthcare Providers: SeriousBroker.it  This test is not yet approved or cleared by the [place]  FDA and has been authorized for detection and/or diagnosis of SARS-CoV-2 by FDA under an Emergency Use Authorization (EUA). This EUA will remain in effect (meaning this test can be used) for the duration of the COVID-19 declaration under Section 564(b)(1) of the Act, 21 U.S.C. section 360bbb-3(b)(1), unless the authorization is terminated or revoked.  Performed at [facility], [address] [place], [place] [address]   Urine Culture     Status:  Abnormal   Collection Time: 04/19/24  3:55 PM   Specimen: Urine, Random  Result Value Ref Range Status   Specimen Description   Final    URINE, RANDOM Performed at [facility], [address], [place], [place] [address]    Special Requests   Final    NONE Reflexed from [id number] Performed at [facility], [address] [place], [place] [address]    Culture (A)  Final    <10,000 COLONIES/mL INSIGNIFICANT GROWTH Performed at [facility], [address], [place], [place] [address]    Report Status 04/20/2024 FINAL  Final  MRSA Next Gen by PCR, Nasal     Status: None   Collection Time: 04/19/24  5:47 PM   Specimen: Nasal Mucosa; Nasal Swab  Result Value Ref Range Status   MRSA by PCR Next Gen NOT DETECTED NOT DETECTED Final    Comment: (NOTE) The GeneXpert MRSA Assay (FDA approved for NASAL specimens only), is one component of a comprehensive MRSA colonization surveillance program. It is not intended to diagnose MRSA infection nor to guide or monitor treatment for MRSA infections. Test performance is not FDA approved in patients less than 72 years old. Performed at [facility], [address], [place], [place] [address]      Radiology Studies: DG CHEST PORT 1 VIEW Result Date: 04/25/2024 CLINICAL DATA:  36304 Hypoxemia 36304 EXAM: PORTABLE CHEST - 1 VIEW COMPARISON:  04/22/2024 FINDINGS: Feeding tube extends at least as far stomach, tip not seen. Small bilateral effusions with patchy atelectasis in the lung bases, improved on the right since previous. Heart size and mediastinal contours are within normal limits. No pneumothorax. Right shoulder DJD. IMPRESSION: Small bilateral effusions with bibasilar atelectasis, improved on the right. Electronically Signed  By: JONETTA Faes M.D.   On: 04/25/2024 09:40     Nilda Fendt, MD, PhD [facility]  Between 7 am - 7 pm I am available, please contact me via Amion  (for emergencies) or Securechat (non urgent messages)  Between 7 pm - 7 am I am not available, please contact night coverage MD/APP via Amion

## 2024-04-25 NOTE — Progress Notes (Signed)
 CPT via bed for 10 minutes -2000 round completed. Tolerated well, NPC at this time.

## 2024-04-25 NOTE — Plan of Care (Signed)

## 2024-04-26 ENCOUNTER — Ambulatory Visit: Payer: Self-pay | Admitting: Nurse Practitioner

## 2024-04-26 DIAGNOSIS — J690 Pneumonitis due to inhalation of food and vomit: Secondary | ICD-10-CM | POA: Diagnosis not present

## 2024-04-26 DIAGNOSIS — E872 Acidosis, unspecified: Secondary | ICD-10-CM

## 2024-04-26 DIAGNOSIS — R109 Unspecified abdominal pain: Secondary | ICD-10-CM | POA: Diagnosis not present

## 2024-04-26 DIAGNOSIS — K8591 Acute pancreatitis with uninfected necrosis, unspecified: Secondary | ICD-10-CM

## 2024-04-26 DIAGNOSIS — J9601 Acute respiratory failure with hypoxia: Secondary | ICD-10-CM | POA: Diagnosis not present

## 2024-04-26 DIAGNOSIS — K8521 Alcohol induced acute pancreatitis with uninfected necrosis: Secondary | ICD-10-CM | POA: Diagnosis not present

## 2024-04-26 LAB — COMPREHENSIVE METABOLIC PANEL WITH GFR
ALT: 51 U/L — ABNORMAL HIGH (ref 0–44)
AST: 77 U/L — ABNORMAL HIGH (ref 15–41)
Albumin: 2.5 g/dL — ABNORMAL LOW (ref 3.5–5.0)
Alkaline Phosphatase: 112 U/L (ref 38–126)
Anion gap: 11 (ref 5–15)
BUN: 16 mg/dL (ref 8–23)
CO2: 24 mmol/L (ref 22–32)
Calcium: 8.7 mg/dL — ABNORMAL LOW (ref 8.9–10.3)
Chloride: 96 mmol/L — ABNORMAL LOW (ref 98–111)
Creatinine, Ser: 0.8 mg/dL (ref 0.61–1.24)
GFR, Estimated: >60 mL/min (ref >60)
Glucose, Bld: 312 mg/dL — ABNORMAL HIGH (ref 70–99)
Potassium: 3.8 mmol/L (ref 3.5–5.1)
Sodium: 131 mmol/L — ABNORMAL LOW (ref 135–145)
Total Bilirubin: 1 mg/dL (ref 0.0–1.2)
Total Protein: 6.5 g/dL (ref 6.5–8.1)

## 2024-04-26 LAB — CBC
HCT: 32 % — ABNORMAL LOW (ref 39.0–52.0)
Hemoglobin: 10.3 g/dL — ABNORMAL LOW (ref 13.0–17.0)
MCH: 32.7 pg (ref 26.0–34.0)
MCHC: 32.2 g/dL (ref 30.0–36.0)
MCV: 101.6 fL — ABNORMAL HIGH (ref 80.0–100.0)
Platelets: 183 10*3/uL (ref 150–400)
RBC: 3.15 MIL/uL — ABNORMAL LOW (ref 4.22–5.81)
RDW: 13.3 % (ref 11.5–15.5)
WBC: 18.7 10*3/uL — ABNORMAL HIGH (ref 4.0–10.5)
nRBC: 0.5 % — ABNORMAL HIGH (ref 0.0–0.2)

## 2024-04-26 LAB — GLUCOSE, CAPILLARY
Glucose-Capillary: 151 mg/dL — ABNORMAL HIGH (ref 70–99)
Glucose-Capillary: 253 mg/dL — ABNORMAL HIGH (ref 70–99)
Glucose-Capillary: 257 mg/dL — ABNORMAL HIGH (ref 70–99)
Glucose-Capillary: 279 mg/dL — ABNORMAL HIGH (ref 70–99)
Glucose-Capillary: 290 mg/dL — ABNORMAL HIGH (ref 70–99)
Glucose-Capillary: 333 mg/dL — ABNORMAL HIGH (ref 70–99)

## 2024-04-26 LAB — PHOSPHORUS: Phosphorus: 3.4 mg/dL (ref 2.5–4.6)

## 2024-04-26 LAB — LIPASE, BLOOD: Lipase: 99 U/L — ABNORMAL HIGH (ref 11–51)

## 2024-04-26 LAB — MAGNESIUM: Magnesium: 1.8 mg/dL (ref 1.7–2.4)

## 2024-04-26 MED ORDER — HEPARIN (PORCINE) 25000 UT/250ML-% IV SOLN
1650.0000 [IU]/h | INTRAVENOUS | Status: AC
  Administered 2024-04-26: 1300 [IU]/h via INTRAVENOUS
  Administered 2024-04-27 – 2024-04-28 (×2): 1350 [IU]/h via INTRAVENOUS
  Administered 2024-04-29: 1450 [IU]/h via INTRAVENOUS
  Administered 2024-04-30: 1650 [IU]/h via INTRAVENOUS
  Filled 2024-04-26 (×5): qty 250

## 2024-04-26 MED ORDER — SODIUM CHLORIDE 0.9 % IV SOLN
2.0000 g | INTRAVENOUS | Status: DC
  Administered 2024-04-26 – 2024-05-02 (×7): 2 g via INTRAVENOUS
  Filled 2024-04-26 (×8): qty 20

## 2024-04-26 MED ORDER — INSULIN ASPART 100 UNIT/ML IJ SOLN
0.0000 [IU] | INTRAMUSCULAR | Status: DC
  Administered 2024-04-26 (×3): 8 [IU] via SUBCUTANEOUS
  Administered 2024-04-26: 11 [IU] via SUBCUTANEOUS
  Administered 2024-04-27 (×2): 8 [IU] via SUBCUTANEOUS
  Administered 2024-04-27: 11 [IU] via SUBCUTANEOUS
  Administered 2024-04-27: 5 [IU] via SUBCUTANEOUS
  Administered 2024-04-27: 11 [IU] via SUBCUTANEOUS
  Administered 2024-04-27 (×2): 3 [IU] via SUBCUTANEOUS
  Administered 2024-04-28: 8 [IU] via SUBCUTANEOUS
  Administered 2024-04-28: 11 [IU] via SUBCUTANEOUS
  Administered 2024-04-28: 5 [IU] via SUBCUTANEOUS
  Administered 2024-04-28: 3 [IU] via SUBCUTANEOUS
  Administered 2024-04-28: 8 [IU] via SUBCUTANEOUS
  Administered 2024-04-29: 15 [IU] via SUBCUTANEOUS
  Administered 2024-04-29 (×2): 5 [IU] via SUBCUTANEOUS
  Administered 2024-04-29 (×2): 3 [IU] via SUBCUTANEOUS
  Administered 2024-04-29: 8 [IU] via SUBCUTANEOUS
  Administered 2024-04-30: 3 [IU] via SUBCUTANEOUS
  Administered 2024-04-30: 8 [IU] via SUBCUTANEOUS
  Administered 2024-04-30: 3 [IU] via SUBCUTANEOUS
  Administered 2024-04-30: 11 [IU] via SUBCUTANEOUS
  Administered 2024-04-30: 8 [IU] via SUBCUTANEOUS
  Administered 2024-04-30: 5 [IU] via SUBCUTANEOUS
  Administered 2024-05-01: 8 [IU] via SUBCUTANEOUS
  Administered 2024-05-01 (×2): 3 [IU] via SUBCUTANEOUS
  Administered 2024-05-01 (×2): 5 [IU] via SUBCUTANEOUS
  Administered 2024-05-01: 8 [IU] via SUBCUTANEOUS
  Administered 2024-05-02 (×4): 5 [IU] via SUBCUTANEOUS
  Administered 2024-05-02 – 2024-05-03 (×3): 3 [IU] via SUBCUTANEOUS
  Administered 2024-05-03: 8 [IU] via SUBCUTANEOUS
  Administered 2024-05-03: 3 [IU] via SUBCUTANEOUS
  Administered 2024-05-03: 8 [IU] via SUBCUTANEOUS
  Administered 2024-05-03: 3 [IU] via SUBCUTANEOUS
  Administered 2024-05-03: 11 [IU] via SUBCUTANEOUS
  Administered 2024-05-04 (×2): 3 [IU] via SUBCUTANEOUS
  Administered 2024-05-04: 5 [IU] via SUBCUTANEOUS
  Administered 2024-05-04 – 2024-05-05 (×3): 3 [IU] via SUBCUTANEOUS
  Administered 2024-05-05 (×3): 2 [IU] via SUBCUTANEOUS
  Administered 2024-05-06: 3 [IU] via SUBCUTANEOUS
  Administered 2024-05-06: 2 [IU] via SUBCUTANEOUS

## 2024-04-26 MED ORDER — INSULIN GLARGINE-YFGN 100 UNIT/ML ~~LOC~~ SOLN
15.0000 [IU] | Freq: Every day | SUBCUTANEOUS | Status: DC
  Administered 2024-04-26 – 2024-04-27 (×2): 15 [IU] via SUBCUTANEOUS
  Filled 2024-04-26 (×2): qty 0.15

## 2024-04-26 MED ORDER — PHENOL 1.4 % MT LIQD
1.0000 | OROMUCOSAL | Status: DC | PRN
  Administered 2024-04-26 – 2024-05-03 (×5): 1 via OROMUCOSAL
  Filled 2024-04-26 (×2): qty 177

## 2024-04-26 MED ORDER — ACETAMINOPHEN 325 MG PO TABS
650.0000 mg | ORAL_TABLET | Freq: Four times a day (QID) | ORAL | Status: DC | PRN
  Administered 2024-04-26 – 2024-05-01 (×7): 650 mg
  Filled 2024-04-26 (×8): qty 2

## 2024-04-26 NOTE — Plan of Care (Signed)
   Problem: Education: Goal: Knowledge of General Education information will improve Description: Including pain rating scale, medication(s)/side effects and non-pharmacologic comfort measures Outcome: Progressing   Problem: Health Behavior/Discharge Planning: Goal: Ability to manage health-related needs will improve Outcome: Progressing   Problem: Clinical Measurements: Goal: Ability to maintain clinical measurements within normal limits will improve Outcome: Progressing Goal: Diagnostic test results will improve Outcome: Progressing Goal: Respiratory complications will improve Outcome: Progressing Goal: Cardiovascular complication will be avoided Outcome: Progressing   Problem: Nutrition: Goal: Adequate nutrition will be maintained Outcome: Progressing

## 2024-04-26 NOTE — Progress Notes (Signed)
 PHARMACY - ANTICOAGULATION CONSULT NOTE  Pharmacy Consult for heparin   Indication: SMV thrombus  Allergies  Allergen Reactions   Lisinopril  Swelling and Other (See Comments)    angioedema   Citalopram Nausea Only   Morphine  Nausea And Vomiting   Nsaids Other (See Comments)    Stomach Irritability    Paroxetine Nausea And Vomiting    Patient Measurements: Height: 5' 7 (170.2 cm) Weight: 54.1 kg (119 lb 4.3 oz) IBW/kg (Calculated) : 66.1 HEPARIN  DW (KG): 65.6  Vital Signs: Temp: 98.6 F (37 C) (06/30 1557) Temp Source: Oral (06/30 1557) BP: 134/53 (06/30 1500) Pulse Rate: 87 (06/30 1557)  Labs: Recent Labs    04/24/24 0524 04/25/24 0513 04/26/24 0340  HGB 10.5* 10.6* 10.3*  HCT 31.5* 33.1* 32.0*  PLT 99* 139* 183  HEPARINUNFRC 0.39  --   --   CREATININE 0.71 0.78 0.80    Estimated Creatinine Clearance: 68.6 mL/min (by C-G formula based on SCr of 0.8 mg/dL).   Medical History: Past Medical History:  Diagnosis Date   Alcohol abuse    Allergic rhinitis    CHF (congestive heart failure) (HCC)    Diabetes mellitus without complication (HCC)    'sometime last yr'--type 2   GERD (gastroesophageal reflux disease)    Hepatitis    he thinks its hep b or c   Hyperlipidemia    Hypertension    Osteoarthritis    Pancreatitis    Rheumatoid arthritis(714.0)    Stroke (HCC)    Tobacco user     Assessment: Patient is a 68 y.o M with hx EtOH abuse and pancreatitis who presented to the ED on 04/19/24 with c/o abdominal pain.  Abdominal CT on 04/19/24 showed findings with concern for necrotizing pancreatitis and thrombus extending from the SMV into the porta splenic confluence and proximal main portal vein.    Pt was started on heparin  drip for anticoagulation on admission. Heparin  was stopped in the afternoon on 6/28 for hemoptysis. Hemoptysis has resolved, pharmacy consulted to restart heparin  drip.   Today, 04/26/2024: CBC: Hgb slightly low but stable, Plt improved  to WNL Pt was most recently therapeutic on heparin  infusion of 1350 units/hr prior to discontinuation   Goal of Therapy:  Heparin  level 0.3-0.7 units/ml Monitor platelets by anticoagulation protocol: Yes   Plan:  No bolus given recent hemoptysis Start heparin  infusion at 1300 units/hr Check 6 hour heparin  level HL, CBC daily Monitor for signs of bleeding, hemoptysis   Ronal CHRISTELLA Rav, PharmD 04/26/24 5:13 PM

## 2024-04-26 NOTE — Progress Notes (Signed)
 eLink Physician-Brief Progress Note Patient Name: [name] DOB: 01/07/1956 MRN: [id number]   Date of Service  04/26/2024  HPI/Events of Note  Tmax 101, not currently on any antibiotics   eICU Interventions  Obtain blood cultures, urine culture no significant growth, Tylenol  as needed     Intervention Category Minor Interventions: Routine modifications to care plan (e.g. PRN medications for pain, fever)  [name] 04/26/2024, 4:47 AM

## 2024-04-26 NOTE — Plan of Care (Signed)
?  Problem: Clinical Measurements: ?Goal: Will remain free from infection ?Outcome: Not Progressing ?  ?Problem: Elimination: ?Goal: Will not experience complications related to bowel motility ?Outcome: Not Progressing ?  ?

## 2024-04-26 NOTE — Progress Notes (Signed)
 NAME:  [name], MRN:  [id number], DOB:  12-20-1955, LOS: 7 ADMISSION DATE:  04/19/2024, CONSULTATION DATE:  6/23 REFERRING MD:  Dr. Dreama , CHIEF COMPLAINT:  Pancreatitis   History of Present Illness:  68 year old male with PMH as below, which is significant for alcohol abuse, RA, pancreatitis, CHF, and DM. He was admitted in February of this year for a total hip arthoplasty for osteoarthritis. Drinks 10 beers per day. The perioperative course was uncomplicated. He now is presenting to [facility] ED 6/23 with complaints of abdominal pain for 3 days. Pain was diffuse, but worse over the right abdominal quadrants. CT of the abdomen demonstrated pancreatitis with necrosis. Lipase 283. CT also noted thrombus extending from the SMV into the portal splenic confluence. He was given IV fluids and started on antibiotics. He was also hypertensive and tachycardic, which was treated effectively with labetalol . GI was consulted by the emergency department and PCCM was asked to admit for severe pancreatitis.   Pertinent  Medical History   has a past medical history of Alcohol abuse, Allergic rhinitis, CHF (congestive heart failure) (HCC), Diabetes mellitus without complication (HCC), GERD (gastroesophageal reflux disease), Hepatitis, Hyperlipidemia, Hypertension, Osteoarthritis, Pancreatitis, Rheumatoid arthritis(714.0), Stroke (HCC), and Tobacco user.  Significant Hospital Events: Including procedures, antibiotic start and stop dates in addition to other pertinent events   6/23 admit for necrotizing pancreatitis 6/24 Hypertensive overnight treated with PRN IV medications, Still having diffuse abdominal pain 6/25 Intermittent episode of confusion/combativeness overnight  6/26 Lightly sedated with bilateral rhonchi, weak cough 6/27 - Sedated this morning with precedex . Some agitation overnight. Tachypenic but not paradoxical 6/28 More alert, Had some hemoptysis  yesterday -> heprin continued, Pulm  toilet advised  6/29 Heparin  held due to hemoptysis  6/30 Febrile overnight with T-max 101.9, Ceftriaxone  started   Interim History / Subjective:  Resting in bed with no acute complaints   Objective    Blood pressure (!) 120/49, pulse 85, temperature (!) 101.9 F (38.8 C), temperature source Oral, resp. rate 18, height 5' 7 (1.702 m), weight 54.1 kg, SpO2 95%.    FiO2 (%):  [30 %] 30 %   Intake/Output Summary (Last 24 hours) at 04/26/2024 0655 Last data filed at 04/26/2024 0300 Gross per 24 hour  Intake 3800 ml  Output 2800 ml  Net 1000 ml   Filed Weights   04/24/24 0418 04/25/24 0500 04/26/24 0429  Weight: 65.3 kg 65.3 kg 54.1 kg    Examination:  General: Acute on chronically ill appearing middle aged male lying in bed, in NAD HEENT: [place]/AT, MM pink/moist, PERRL,  Neuro: Sleepy but easily arousalble  CV: s1s2 regular rate and rhythm, no murmur, rubs, or gallops,  PULM:  Slight rhonchi bilaterally, on 5 L Auxier, no increased work of breathing, no added breath sounds  GI: soft, bowel sounds active in all 4 quadrants, non-tender, non-distended, tolerating oral diet  Extremities: warm/dry, no edema  Skin: no rashes or lesions  Resolved problem list  Lactic acidosis -Secondary to pancreatitis  AKI  Assessment and Plan   Acute necrotizing pancreatitis -Severe pancreatitis. History of alcoholic pancreatitis. Also takes Farxiga  which could possibly be a contributor.  -Surgery signed off 6/26 P: Continue supportive care  Multimodal pain control  Encourage oral intake with continued tube feeds until oral intake adequate Intermittently trend Lipase and LTFs  Acute hypoxic respiratory failure  Aspiration pneumonia - Initially secondary to sedation in the setting of EtOH withdrawal but it appears patient has now likely  developed an aspiration pneumonia given fever and increased leukocytosis with extensive consolidation in lung bases bilaterally as of 6/29 Hemoptysis  -First  noticed 6/28, Heparin  held 6/29 P: Start empiric ceftriaxone  Follow sputum culture Trend CBC and fever curve Pulmonary hygiene Minimize sedation Mobilize Discussed with attending potential to resume heparin  drip today  SMV - portosplenic thrombus -Secondary to pancreatitis. Nonocclusive P: Heparin  drip remains on hold as above  Alcohol withdrawal with history of daily consumption -improved -Last drink 6/21 P: Continue CIWA protocol Continue with Librium taper Seizure precautions Thiamine , folate, multivitamin  Hyponatremia Hypokalemia P: Supplement as needed Trend BMP  DM2 with hyperglycemia P: Increase SSI to moderate scale CBG checks every 4 CBG goal 1 4180 Slightly increase Semglee  to 15 daily  Chronic HFpEF HTN P: Continue melena and carvedilol  prior to Continuous telemetry Posted IV antihypertensives  Rheumatoid arthritis -Doesn't appear as though he is on any home medications P: Supportive care  Best Practice (right click and Reselect all SmartList Selections daily)   Diet/type: NPO DVT prophylaxis systemic heparin  Pressure ulcer(s): pressure ulcer assessment deferred  GI prophylaxis: PPI Lines: N/A Foley:  N/A Code Status:  full code Last date of multidisciplinary goals of care discussion Pending   Signature   Jashiya Bassett D. Harris, NP-C [facility] Personal contact information can be found on Amion  If no contact or response made please call [phone] 04/26/2024, 6:59 AM

## 2024-04-26 NOTE — Progress Notes (Signed)
 PROGRESS NOTE  [name] FMW:[id number] DOB: 11/08/1955 DOA: 04/19/2024 PCP: [name], NP   LOS: 7 days   Brief Narrative / Interim history: 68 year old male with history of DM2, HTN, HLD, rheumatoid arthritis, prior CVA, tobacco and alcohol use comes into the hospital and is admitted on 6/23 with abdominal pain, found to have necrotizing pancreatitis.  Hospital course complicated by persistent abdominal pain requiring NG tube placement for feeding, also hypoxic respiratory failure requiring high flow oxygen.  He was also found to have a thrombus extending from the SMV and the portal splenic confluence.  Has been placed on anticoagulation.  GI consulted early on but they signed off.  Surgery also consulted due to necrotizing pancreatitis  Subjective / 24h Interval events: C/o sore throat  Assesement and Plan: Principal Problem:   Pancreatitis, alcoholic, acute Active Problems:   Malnutrition of moderate degree   Hemoptysis   Acute hypoxemic respiratory failure (HCC)   Principal problem Acute necrotizing pancreatitis, EtOH induced -imaging on admission showed acute pancreatitis with findings suggestive of necrotizing pancreatitis.  With no abscess or pseudocyst were seen.  GI consulted, now signed off.  General surgery also consulted, no indication for surgery. - Continue supportive care -tolerating clears  Acute hypoxic respiratory failure - Chest x-ray does not show any significant ARDS, there is a discrepancy between how the chest x-ray looks and how much oxygen he needs -abx for aspiration pna  SMV portal splenic thrombus-continue anticoagulation.  This is likely due to pancreatitis.  Thrombus is nonocclusive.  Essential hypertension-pretty hypertensive on admission, has been placed on amlodipine , Coreg .  Continue  EtOH use-no withdrawals noted  Hypokalemia-replace   Chronic diastolic CHF-now being diuresed with furosemide   RA-does not appear to be on any  medications at home  Anemia of critical illness-hemoglobin stable, no bleeding  Thrombocytopenia-in the setting of EtOH use  DM2, with hyperglycemia-continue insulin - adjust for better coverage-- carb mod diet      Scheduled Meds:  amLODipine   10 mg Per J Tube Daily   carvedilol   12.5 mg Per Tube BID WC   chlordiazePOXIDE  25 mg Per Tube Daily   Chlorhexidine  Gluconate Cloth  6 each Topical Daily   feeding supplement (PROSource TF20)  60 mL Per Tube Daily   folic acid   1 mg Intravenous Daily   furosemide   40 mg Intravenous Q12H   insulin  aspart  0-15 Units Subcutaneous Q4H   insulin  aspart  2 Units Subcutaneous Q4H   insulin  glargine-yfgn  15 Units Subcutaneous Daily   multivitamin  15 mL Per Tube Daily   nicotine   21 mg Transdermal Daily   thiamine   100 mg Per Tube Daily   Or   thiamine   100 mg Intravenous Daily   tranexamic acid   500 mg Nebulization Q8H   Continuous Infusions:  cefTRIAXone  (ROCEPHIN )  IV Stopped (04/26/24 0853)   feeding supplement (OSMOLITE 1.5 CAL) 55 mL/hr at 04/26/24 1225   PRN Meds:.acetaminophen , docusate, hydrALAZINE , HYDROmorphone  (DILAUDID ) injection, labetalol , ondansetron  (ZOFRAN ) IV, mouth rinse, phenol, polyethylene glycol  Current Outpatient Medications  Medication Instructions   amLODipine  (NORVASC ) 10 mg, Oral, Daily   atorvastatin  (LIPITOR) 40 mg, Daily   carvedilol  (COREG  CR) 80 mg, Daily   cetirizine  (ZYRTEC ) 10 mg, Oral, Daily   clopidogrel  (PLAVIX ) 75 mg, Oral, Daily   dapagliflozin  propanediol (FARXIGA ) 10 mg, Oral, Daily before breakfast   fenofibrate micronized (LOFIBRA) 134 mg, Every evening   lidocaine  (LIDODERM ) 5 % 1 patch, Transdermal, Every 24 hours, Apply  1 patch to area of pain for up to 12 hours.  Then remove patch for full 12 hours before reapplying a new patch.   metFORMIN  (GLUCOPHAGE ) 500 mg, Oral, Daily with breakfast   metoprolol  tartrate (LOPRESSOR ) 50 mg, Oral, 2 times daily, Take one two times a day for blood  pressure.   oxyCODONE -acetaminophen  (PERCOCET) 10-325 MG tablet 1 tablet, 5 times daily   pantoprazole  (PROTONIX ) 40 mg, 2 times daily   rivaroxaban  (XARELTO ) 10 mg, Oral, Daily, For 30 days to prevent blood clots after surgery   sennosides-docusate sodium  (SENOKOT-S) 8.6-50 MG tablet 2 tablets, Oral, Daily PRN   tamsulosin  (FLOMAX ) 0.8 mg, Oral, Daily   traZODone  (DESYREL ) 100 mg, Oral, Daily at bedtime, Take one table by mouth at bedtime as needed for sleep.    Diet Orders (From admission, onward)     Start     Ordered   04/25/24 1022  Diet clear liquid Room service appropriate? Yes; Fluid consistency: Thin  Diet effective now       Question Answer Comment  Room service appropriate? Yes   Fluid consistency: Thin      04/25/24 1021            DVT prophylaxis:    Lab Results  Component Value Date   PLT 183 04/26/2024      Code Status: Full Code  Family Communication: no family at bedside   Status is: Inpatient Remains inpatient appropriate because: severity of illness  Level of care: ICU  Consultants:  PCCM GI Surgery   Objective: Vitals:   04/26/24 0900 04/26/24 1000 04/26/24 1100 04/26/24 1200  BP: 128/75 (!) 141/67 (!) 159/64 (!) 101/38  Pulse: 73 75 83 79  Resp: 15 (!) 28 (!) 24 19  Temp:    (!) 100.7 F (38.2 C)  TempSrc:    Oral  SpO2: 95% 100% 94% 96%  Weight:      Height:        Intake/Output Summary (Last 24 hours) at 04/26/2024 1251 Last data filed at 04/26/2024 1225 Gross per 24 hour  Intake 3004.39 ml  Output 1732 ml  Net 1272.39 ml   Wt Readings from Last 3 Encounters:  04/26/24 54.1 kg  04/02/24 66.7 kg  12/02/23 70.8 kg    Examination:   General: Appearance:    Thin male in no acute distress     Lungs:      respirations unlabored  Heart:    Normal heart rate.   MS:   All extremities are intact.   Neurologic:   Awake, alert     Data Reviewed: I have independently reviewed following labs and imaging studies    CBC Recent Labs  Lab 04/23/24 0438 04/24/24 0024 04/24/24 0524 04/25/24 0513 04/26/24 0340  WBC 12.3* 12.3* 12.2* 15.5* 18.7*  HGB 11.1* 11.0* 10.5* 10.6* 10.3*  HCT 32.1* 33.8* 31.5* 33.1* 32.0*  PLT 80* 96* 99* 139* 183  MCV 97.9 102.1* 100.6* 104.7* 101.6*  MCH 33.8 33.2 33.5 33.5 32.7  MCHC 34.6 32.5 33.3 32.0 32.2  RDW 14.0 14.1 14.4 14.1 13.3  LYMPHSABS  --  2.3  --   --   --   MONOABS  --  1.6*  --   --   --   EOSABS  --  0.0  --   --   --   BASOSABS  --  0.0  --   --   --     Recent Labs  Lab 04/19/24 1331 04/19/24  1420 04/19/24 1532 04/19/24 1654 04/19/24 1923 04/20/24 0143 04/21/24 0334 04/22/24 0444 04/22/24 1714 04/23/24 0438 04/23/24 1647 04/24/24 0524 04/24/24 1712 04/25/24 0513 04/26/24 0340  NA  --  127*   < >  --   --  130*   < > 136 136 137  --  141  --  139 131*  K  --  6.2*   < >  --   --  4.2   < > 3.1* 3.9 3.2*  --  3.4*  --  3.9 3.8  CL  --  99   < >  --   --  101   < > 103 104 104  --  108  --  105 96*  CO2  --   --    < >  --   --  17*   < > 23 23 25   --  24  --  24 24  GLUCOSE  --  377*   < >  --   --  152*   < > 202* 239* 171*  --  198*  --  302* 312*  BUN  --  18   < >  --   --  21   < > 20 17 16   --  15  --  17 16  CREATININE  --  1.50*   < >  --   --  1.57*   < > 0.76 0.82 0.75  --  0.71  --  0.78 0.80  CALCIUM   --   --    < >  --   --  8.1*   < > 8.3* 8.5* 8.3*  --  8.7*  --  8.8* 8.7*  AST  --   --    < >  --   --  59*   < > 55*  --  62*  --  51*  --  47* 77*  ALT  --   --    < >  --   --  45*   < > 29  --  30  --  32  --  39 51*  ALKPHOS  --   --    < >  --   --  58   < > 51  --  64  --  87  --  93 112  BILITOT  --   --    < >  --   --  2.4*   < > 2.4*  --  1.6*  --  1.0  --  1.3* 1.0  ALBUMIN   --   --    < >  --   --  2.8*   < > 2.4*  --  2.3*  --  2.4*  --  2.5* 2.5*  MG  --   --   --   --   --  1.8   < > 2.4 2.2 2.1 2.1 2.1 2.0 1.9 1.8  CRP  --   --   --   --   --  23.5*  --   --   --   --   --   --   --   --   --    LATICACIDVEN  --  6.9*  --  7.6* 3.9*  --   --   --   --   --   --   --   --   --   --   INR 1.1  --   --   --   --   --   --   --   --   --   --   --   --   --   --    < > =  values in this interval not displayed.    ------------------------------------------------------------------------------------------------------------------ No results for input(s): CHOL, HDL, LDLCALC, TRIG, CHOLHDL, LDLDIRECT in the last 72 hours.  Lab Results  Component Value Date   HGBA1C 6.1 (H) 04/02/2024   ------------------------------------------------------------------------------------------------------------------ No results for input(s): TSH, T4TOTAL, T3FREE, THYROIDAB in the last 72 hours.  Invalid input(s): FREET3  Cardiac Enzymes No results for input(s): CKMB, TROPONINI, MYOGLOBIN in the last 168 hours.  Invalid input(s): CK ------------------------------------------------------------------------------------------------------------------ No results found for: BNP  CBG: Recent Labs  Lab 04/25/24 1956 04/25/24 2354 04/26/24 0404 04/26/24 0724 04/26/24 1118  GLUCAP 269* 246* 290* 279* 333*    Recent Results (from the past 240 hours)  Blood Culture (routine x 2)     Status: None   Collection Time: 04/19/24  1:45 PM   Specimen: BLOOD LEFT ARM  Result Value Ref Range Status   Specimen Description   Final    BLOOD LEFT ARM Performed at [facility], [address], [place], [place] [address]    Special Requests   Final    BOTTLES DRAWN AEROBIC AND ANAEROBIC Blood Culture adequate volume Performed at [facility], [address] [place], [place] [address]    Culture   Final    NO GROWTH 5 DAYS Performed at [facility], [address] [place], [place] [address]    Report Status 04/24/2024 FINAL  Final  Resp panel by RT-PCR (RSV, Flu A&B, Covid) Anterior Nasal Swab     Status: None   Collection Time: 04/19/24  2:49 PM    Specimen: Anterior Nasal Swab  Result Value Ref Range Status   SARS Coronavirus 2 by RT PCR NEGATIVE NEGATIVE Final    Comment: (NOTE) SARS-CoV-2 target nucleic acids are NOT DETECTED.  The SARS-CoV-2 RNA is generally detectable in upper respiratory specimens during the acute phase of infection. The lowest concentration of SARS-CoV-2 viral copies this assay can detect is 138 copies/mL. A negative result does not preclude SARS-Cov-2 infection and should not be used as the sole basis for treatment or other patient management decisions. A negative result may occur with  improper specimen collection/handling, submission of specimen other than nasopharyngeal swab, presence of viral mutation(s) within the areas targeted by this assay, and inadequate number of viral copies(<138 copies/mL). A negative result must be combined with clinical observations, patient history, and epidemiological information. The expected result is Negative.  Fact Sheet for Patients:  BloggerCourse.com  Fact Sheet for Healthcare Providers:  SeriousBroker.it  This test is no t yet approved or cleared by the [place]  FDA and  has been authorized for detection and/or diagnosis of SARS-CoV-2 by FDA under an Emergency Use Authorization (EUA). This EUA will remain  in effect (meaning this test can be used) for the duration of the COVID-19 declaration under Section 564(b)(1) of the Act, 21 U.S.C.section 360bbb-3(b)(1), unless the authorization is terminated  or revoked sooner.       Influenza A by PCR NEGATIVE NEGATIVE Final   Influenza B by PCR NEGATIVE NEGATIVE Final    Comment: (NOTE) The Xpert Xpress SARS-CoV-2/FLU/RSV plus assay is intended as an aid in the diagnosis of influenza from Nasopharyngeal swab specimens and should not be used as a sole basis for treatment. Nasal washings and aspirates are unacceptable for Xpert Xpress  SARS-CoV-2/FLU/RSV testing.  Fact Sheet for Patients: BloggerCourse.com  Fact Sheet for Healthcare Providers: SeriousBroker.it  This test is not yet approved or cleared by the [place]  FDA and has  been authorized for detection and/or diagnosis of SARS-CoV-2 by FDA under an Emergency Use Authorization (EUA). This EUA will remain in effect (meaning this test can be used) for the duration of the COVID-19 declaration under Section 564(b)(1) of the Act, 21 U.S.C. section 360bbb-3(b)(1), unless the authorization is terminated or revoked.     Resp Syncytial Virus by PCR NEGATIVE NEGATIVE Final    Comment: (NOTE) Fact Sheet for Patients: BloggerCourse.com  Fact Sheet for Healthcare Providers: SeriousBroker.it  This test is not yet approved or cleared by the [place]  FDA and has been authorized for detection and/or diagnosis of SARS-CoV-2 by FDA under an Emergency Use Authorization (EUA). This EUA will remain in effect (meaning this test can be used) for the duration of the COVID-19 declaration under Section 564(b)(1) of the Act, 21 U.S.C. section 360bbb-3(b)(1), unless the authorization is terminated or revoked.  Performed at [facility], [address] [place], [place] [address]   Urine Culture     Status: Abnormal   Collection Time: 04/19/24  3:55 PM   Specimen: Urine, Random  Result Value Ref Range Status   Specimen Description   Final    URINE, RANDOM Performed at [facility], [address] [place], [place] [address]    Special Requests   Final    NONE Reflexed from [id number] Performed at [facility], [address], [place], [place] [address]    Culture (A)  Final    <10,000 COLONIES/mL INSIGNIFICANT GROWTH Performed at [facility], [address], [place], [place] [address]    Report Status  04/20/2024 FINAL  Final  MRSA Next Gen by PCR, Nasal     Status: None   Collection Time: 04/19/24  5:47 PM   Specimen: Nasal Mucosa; Nasal Swab  Result Value Ref Range Status   MRSA by PCR Next Gen NOT DETECTED NOT DETECTED Final    Comment: (NOTE) The GeneXpert MRSA Assay (FDA approved for NASAL specimens only), is one component of a comprehensive MRSA colonization surveillance program. It is not intended to diagnose MRSA infection nor to guide or monitor treatment for MRSA infections. Test performance is not FDA approved in patients less than 7 years old. Performed at [facility], [address], [place], [place] [address]      Radiology Studies: No results found.    Harlene Bowl DO  [facility]  Between 7 am - 7 pm I am available, please contact me via Amion (for emergencies) or Securechat (non urgent messages)  Between 7 pm - 7 am I am not available, please contact night coverage MD/APP via Amion

## 2024-04-27 DIAGNOSIS — K8591 Acute pancreatitis with uninfected necrosis, unspecified: Secondary | ICD-10-CM | POA: Diagnosis not present

## 2024-04-27 DIAGNOSIS — J690 Pneumonitis due to inhalation of food and vomit: Secondary | ICD-10-CM | POA: Diagnosis not present

## 2024-04-27 DIAGNOSIS — K8521 Alcohol induced acute pancreatitis with uninfected necrosis: Secondary | ICD-10-CM | POA: Diagnosis not present

## 2024-04-27 DIAGNOSIS — J9601 Acute respiratory failure with hypoxia: Secondary | ICD-10-CM | POA: Diagnosis not present

## 2024-04-27 DIAGNOSIS — R109 Unspecified abdominal pain: Secondary | ICD-10-CM | POA: Diagnosis not present

## 2024-04-27 DIAGNOSIS — I81 Portal vein thrombosis: Secondary | ICD-10-CM | POA: Diagnosis not present

## 2024-04-27 LAB — CBC
HCT: 27.5 % — ABNORMAL LOW (ref 39.0–52.0)
Hemoglobin: 9.2 g/dL — ABNORMAL LOW (ref 13.0–17.0)
MCH: 33.7 pg (ref 26.0–34.0)
MCHC: 33.5 g/dL (ref 30.0–36.0)
MCV: 100.7 fL — ABNORMAL HIGH (ref 80.0–100.0)
Platelets: 223 10*3/uL (ref 150–400)
RBC: 2.73 MIL/uL — ABNORMAL LOW (ref 4.22–5.81)
RDW: 12.9 % (ref 11.5–15.5)
WBC: 21.2 10*3/uL — ABNORMAL HIGH (ref 4.0–10.5)
nRBC: 0.1 % (ref 0.0–0.2)

## 2024-04-27 LAB — HEPARIN LEVEL (UNFRACTIONATED)
Heparin Unfractionated: 0.29 [IU]/mL — ABNORMAL LOW (ref 0.30–0.70)
Heparin Unfractionated: 0.39 [IU]/mL (ref 0.30–0.70)

## 2024-04-27 LAB — COMPREHENSIVE METABOLIC PANEL WITH GFR
ALT: 45 U/L — ABNORMAL HIGH (ref 0–44)
AST: 51 U/L — ABNORMAL HIGH (ref 15–41)
Albumin: 2.3 g/dL — ABNORMAL LOW (ref 3.5–5.0)
Alkaline Phosphatase: 101 U/L (ref 38–126)
Anion gap: 10 (ref 5–15)
BUN: 17 mg/dL (ref 8–23)
CO2: 28 mmol/L (ref 22–32)
Calcium: 8.5 mg/dL — ABNORMAL LOW (ref 8.9–10.3)
Chloride: 92 mmol/L — ABNORMAL LOW (ref 98–111)
Creatinine, Ser: 0.72 mg/dL (ref 0.61–1.24)
GFR, Estimated: >60 mL/min (ref >60)
Glucose, Bld: 155 mg/dL — ABNORMAL HIGH (ref 70–99)
Potassium: 3.5 mmol/L (ref 3.5–5.1)
Sodium: 130 mmol/L — ABNORMAL LOW (ref 135–145)
Total Bilirubin: 0.7 mg/dL (ref 0.0–1.2)
Total Protein: 6.3 g/dL — ABNORMAL LOW (ref 6.5–8.1)

## 2024-04-27 LAB — GLUCOSE, CAPILLARY
Glucose-Capillary: 190 mg/dL — ABNORMAL HIGH (ref 70–99)
Glucose-Capillary: 235 mg/dL — ABNORMAL HIGH (ref 70–99)
Glucose-Capillary: 263 mg/dL — ABNORMAL HIGH (ref 70–99)
Glucose-Capillary: 294 mg/dL — ABNORMAL HIGH (ref 70–99)
Glucose-Capillary: 311 mg/dL — ABNORMAL HIGH (ref 70–99)
Glucose-Capillary: 317 mg/dL — ABNORMAL HIGH (ref 70–99)

## 2024-04-27 MED ORDER — FUROSEMIDE 10 MG/ML IJ SOLN
20.0000 mg | Freq: Two times a day (BID) | INTRAMUSCULAR | Status: DC
  Administered 2024-04-27 – 2024-05-02 (×10): 20 mg via INTRAVENOUS
  Filled 2024-04-27 (×10): qty 2

## 2024-04-27 MED ORDER — POTASSIUM CHLORIDE CRYS ER 20 MEQ PO TBCR
40.0000 meq | EXTENDED_RELEASE_TABLET | Freq: Once | ORAL | Status: AC
  Administered 2024-04-27: 40 meq via ORAL
  Filled 2024-04-27: qty 2

## 2024-04-27 MED ORDER — INSULIN ASPART 100 UNIT/ML IJ SOLN
6.0000 [IU] | INTRAMUSCULAR | Status: DC
  Administered 2024-04-27 – 2024-05-06 (×50): 6 [IU] via SUBCUTANEOUS

## 2024-04-27 MED ORDER — INSULIN GLARGINE-YFGN 100 UNIT/ML ~~LOC~~ SOLN
20.0000 [IU] | Freq: Every day | SUBCUTANEOUS | Status: DC
  Administered 2024-04-28: 20 [IU] via SUBCUTANEOUS
  Filled 2024-04-27 (×2): qty 0.2

## 2024-04-27 NOTE — Progress Notes (Signed)
 [facility] ADULT ICU REPLACEMENT PROTOCOL   The patient does apply for the [facility] Adult ICU Electrolyte Replacment Protocol based on the criteria listed below:   1.Exclusion criteria: TCTS, ECMO, Dialysis, and Myasthenia Gravis patients 2. Is GFR >/= 30 ml/min? Yes.    Patient's GFR today is >60 3. Is SCr </= 2? Yes.   Patient's SCr is 0.72 mg/dL 4. Did SCr increase >/= 0.5 in 24 hours? No. 5.Pt's weight >40kg  Yes.   6. Abnormal electrolyte(s): K+ = 3.5  7. Electrolytes replaced per protocol 8.  Call MD STAT for K+ </= 2.5, Phos </= 1, or Mag </= 1 Physician:  Haze, eMD   [name] [name] 04/27/2024 6:04 AM

## 2024-04-27 NOTE — Plan of Care (Signed)
   Problem: Education: Goal: Knowledge of General Education information will improve Description: Including pain rating scale, medication(s)/side effects and non-pharmacologic comfort measures Outcome: Progressing   Problem: Activity: Goal: Risk for activity intolerance will decrease Outcome: Progressing   Problem: Nutrition: Goal: Adequate nutrition will be maintained Outcome: Progressing   Problem: Coping: Goal: Level of anxiety will decrease Outcome: Progressing   Problem: Elimination: Goal: Will not experience complications related to urinary retention Outcome: Progressing   Problem: Safety: Goal: Ability to remain free from injury will improve Outcome: Progressing   Problem: Skin Integrity: Goal: Risk for impaired skin integrity will decrease Outcome: Progressing

## 2024-04-27 NOTE — Inpatient Diabetes Management (Signed)
 Inpatient Diabetes Program Recommendations  AACE/ADA: New Consensus Statement on Inpatient Glycemic Control (2015)  Target Ranges:  Prepandial:   less than 140 mg/dL      Peak postprandial:   less than 180 mg/dL (1-2 hours)      Critically ill patients:  140 - 180 mg/dL   Lab Results  Component Value Date   GLUCAP 317 (H) 04/27/2024   HGBA1C 6.1 (H) 04/02/2024    Review of Glycemic Control  Diabetes history: DM2 Outpatient Diabetes medications: Not taking any home DM meds at present Current orders for Inpatient glycemic control: Semglee  15 daily, Novolog  0-15 Q4H + 2 units Q4H  CBGs today: 235, 263  Inpatient Diabetes Program Recommendations:    Consider increasing Semglee  to 16 units at bedtime  Consider increasing Novolog  to 3 units Q4H  Will continue to follow.  Thank you. Shona Brandy, RD, LDN, CDCES Inpatient Diabetes Coordinator [phone]

## 2024-04-27 NOTE — Progress Notes (Signed)
 PHARMACY - ANTICOAGULATION CONSULT NOTE  Pharmacy Consult for heparin   Indication: SMV thrombus  Allergies  Allergen Reactions   Lisinopril  Swelling and Other (See Comments)    angioedema   Citalopram Nausea Only   Morphine  Nausea And Vomiting   Nsaids Other (See Comments)    Stomach Irritability    Paroxetine Nausea And Vomiting    Patient Measurements: Height: 5' 7 (170.2 cm) Weight: 54.1 kg (119 lb 4.3 oz) IBW/kg (Calculated) : 66.1 HEPARIN  DW (KG): 65.6  Vital Signs: Temp: 99.8 F (37.7 C) (07/01 0000) Temp Source: Oral (07/01 0000) BP: 106/50 (06/30 1900) Pulse Rate: 85 (06/30 1900)  Labs: Recent Labs    04/24/24 0524 04/25/24 0513 04/26/24 0340 04/27/24 0008  HGB 10.5* 10.6* 10.3*  --   HCT 31.5* 33.1* 32.0*  --   PLT 99* 139* 183  --   HEPARINUNFRC 0.39  --   --  0.29*  CREATININE 0.71 0.78 0.80 0.72    Estimated Creatinine Clearance: 68.6 mL/min (by C-G formula based on SCr of 0.72 mg/dL).   Medical History: Past Medical History:  Diagnosis Date   Alcohol abuse    Allergic rhinitis    CHF (congestive heart failure) (HCC)    Diabetes mellitus without complication (HCC)    'sometime last yr'--type 2   GERD (gastroesophageal reflux disease)    Hepatitis    he thinks its hep b or c   Hyperlipidemia    Hypertension    Osteoarthritis    Pancreatitis    Rheumatoid arthritis(714.0)    Stroke (HCC)    Tobacco user     Assessment: Patient is a 68 y.o M with hx EtOH abuse and pancreatitis who presented to the ED on 04/19/24 with c/o abdominal pain.  Abdominal CT on 04/19/24 showed findings with concern for necrotizing pancreatitis and thrombus extending from the SMV into the porta splenic confluence and proximal main portal vein.    Pt was started on heparin  drip for anticoagulation on admission. Heparin  was stopped in the afternoon on 6/28 for hemoptysis. Hemoptysis has resolved, pharmacy consulted to restart heparin  drip.   Today,  04/27/2024: Initial heparin  level after restart 0.29 on IV heparin  1300 units/hr CBC: Hgb slightly low/trending down, Plt improved to WNL No bleeding or infusion related concerns reported by RN   Goal of Therapy:  Heparin  level 0.3-0.7 units/ml Monitor platelets by anticoagulation protocol: Yes   Plan:  No bolus given recent hemoptysis Increase heparin  infusion to 1350 units/hr Check 8 hour heparin  level HL, CBC daily Monitor for signs of bleeding, hemoptysis   Rosaline Millet, PharmD 04/27/24 @ 12:45 AM

## 2024-04-27 NOTE — Plan of Care (Signed)
   Problem: Nutrition: Goal: Adequate nutrition will be maintained Outcome: Progressing   Problem: Coping: Goal: Level of anxiety will decrease Outcome: Progressing   Problem: Elimination: Goal: Will not experience complications related to bowel motility Outcome: Progressing Goal: Will not experience complications related to urinary retention Outcome: Progressing

## 2024-04-27 NOTE — Progress Notes (Signed)
 Nutrition Follow-up  DOCUMENTATION CODES:   Non-severe (moderate) malnutrition in context of chronic illness  INTERVENTION:   -Continue Osmolite 1.5 at 55 ml/h (1320 ml per day) Prosource TF20 60 ml daily Provides 2060 kcal, 102 gm protein, 1006 ml free water daily -FWF recommendations: 150 ml every 4 hours  -Continue vitamin supplementation  -Will monitor for diet advancement beyond liquids  NUTRITION DIAGNOSIS:   Moderate Malnutrition related to chronic illness (alcohol abuse; CHF) as evidenced by mild fat depletion, moderate muscle depletion.  Ongoing.  GOAL:   Patient will meet greater than or equal to 90% of their needs  Meeting with TF  MONITOR:   Diet advancement, Labs, Weight trends, TF tolerance  REASON FOR ASSESSMENT:   Consult Poor PO  ASSESSMENT:   68 y.o. male with PMH alcohol abuse, CHF, DM2, pancreatitis, and RA who presented with abdominal pain and admitted for severe acute necrotizing alcoholic pancreatitis.  6/23: admitted 6/25: post-pyloric Cortrak placed  Patient continues Osmolite 1.5 at goal rate of 55 ml/hr. Pt currently on clear liquids -CHO mod which doesn't provide much substantial nutrition. Recommend considering diet advancement past liquids before decreasing tube feeds.   Admission weight:124 lbs Current weight: 119 lbs   Medications: Folic acid , Lasix , Liquid MVI, KLOR-CON , Thiamine   Labs reviewed: CBGs: 235-317 Low Na  Diet Order:   Diet Order             Diet clear liquid Room service appropriate? Yes; Fluid consistency: Thin  Diet effective now                   EDUCATION NEEDS:   Not appropriate for education at this time  Skin:  Skin Assessment: Reviewed RN Assessment  Last BM:  6/30 -type 7  Height:   Ht Readings from Last 1 Encounters:  04/23/24 5' 7 (1.702 m)    Weight:   Wt Readings from Last 1 Encounters:  04/26/24 54.1 kg    BMI:  Body mass index is 18.68 kg/m.  Estimated Nutritional  Needs:   Kcal:  1900-2050 kcals  Protein:  90-100 grams  Fluid:  >/= 1.9L  Morna Lee, MS, RD, LDN Inpatient Clinical Dietitian Contact via Secure chat

## 2024-04-27 NOTE — Progress Notes (Signed)
 NAME:  [name], MRN:  [id number], DOB:  [birth year], LOS: 8 ADMISSION DATE:  04/19/2024, CONSULTATION DATE:  6/23 REFERRING MD:  Dr. Dreama , CHIEF COMPLAINT:  Pancreatitis   History of Present Illness:  68 year old male with PMH as below, which is significant for alcohol abuse, RA, pancreatitis, CHF, and DM. He was admitted in February of this year for a total hip arthoplasty for osteoarthritis. Drinks 10 beers per day. The perioperative course was uncomplicated. He now is presenting to [facility] ED 6/23 with complaints of abdominal pain for 3 days. Pain was diffuse, but worse over the right abdominal quadrants. CT of the abdomen demonstrated pancreatitis with necrosis. Lipase 283. CT also noted thrombus extending from the SMV into the portal splenic confluence. He was given IV fluids and started on antibiotics. He was also hypertensive and tachycardic, which was treated effectively with labetalol . GI was consulted by the emergency department and PCCM was asked to admit for severe pancreatitis.   Pertinent  Medical History   has a past medical history of Alcohol abuse, Allergic rhinitis, CHF (congestive heart failure) (HCC), Diabetes mellitus without complication (HCC), GERD (gastroesophageal reflux disease), Hepatitis, Hyperlipidemia, Hypertension, Osteoarthritis, Pancreatitis, Rheumatoid arthritis(714.0), Stroke (HCC), and Tobacco user.  Significant Hospital Events: Including procedures, antibiotic start and stop dates in addition to other pertinent events   6/23 admit for necrotizing pancreatitis 6/24 Hypertensive overnight treated with PRN IV medications, Still having diffuse abdominal pain 6/25 Intermittent episode of confusion/combativeness overnight  6/26 Lightly sedated with bilateral rhonchi, weak cough 6/27 - Sedated this morning with precedex . Some agitation overnight. Tachypenic but not paradoxical 6/28 More alert, Had some hemoptysis  yesterday -> heprin continued, Pulm  toilet advised  6/29 Heparin  held due to hemoptysis  6/30 Febrile overnight with T-max 101.9, Ceftriaxone  started  7/1 on heparin  without hemoptysis, calm  Interim History / Subjective:  Resting in bed, states he still has intermittent abdominal pain   Objective    Blood pressure (!) 109/51, pulse 83, temperature 98.6 F (37 C), temperature source Oral, resp. rate (!) 22, height 5' 7 (1.702 m), weight 54.1 kg, SpO2 95%.        Intake/Output Summary (Last 24 hours) at 04/27/2024 0720 Last data filed at 04/27/2024 0408 Gross per 24 hour  Intake 1763.12 ml  Output 1132 ml  Net 631.12 ml   Filed Weights   04/24/24 0418 04/25/24 0500 04/26/24 0429  Weight: 65.3 kg 65.3 kg 54.1 kg    Examination:  General: Acute on chronically ill appearing middle aged male lying in bed, in NAD HEENT: [place]/AT, MM pink/moist, PERRL,  Neuro: Sleepy but easily arousable, a little slow to answer questions but oriented x3, moving all extremities CV: s1s2 regular rate and rhythm, no murmur, rubs, or gallops,  PULM:  scattered rhonchi on the R, no dyspnea or tachypnea on [place]   GI: soft, mildly distended with diffuse mild TTP in the upper quadrants with faint BS Extremities: warm/dry, 1+ pedal    Resolved problem list  Lactic acidosis -Secondary to pancreatitis  AKI  Assessment and Plan   Acute necrotizing pancreatitis -Severe pancreatitis. History of alcoholic pancreatitis. Also takes Farxiga  which could possibly be a contributor.  -Surgery signed off 6/26 P: Continue supportive care  Multimodal pain control  Encourage oral intake with continued tube feeds until oral intake adequate Intermittently trend Lipase and LTFs No current critical care needs, PCCM will sign off, please reengage for any concerns   Acute hypoxic respiratory  failure  Aspiration pneumonia - Initially secondary to sedation in the setting of EtOH withdrawal but it appears patient has now likely developed an aspiration pneumonia  given fever and increased leukocytosis with extensive consolidation in lung bases bilaterally as of 6/29 Hemoptysis  -First noticed 6/28, Heparin  held 6/29 P: Continue empiric ceftriaxone   Sputum culture if able to produce Trend CBC and fever curve Pulmonary hygiene Minimize sedation Mobilize Heparin  resumed, monitor for signs of bleeding  SMV - portosplenic thrombus -Secondary to pancreatitis. Nonocclusive P: Continue heparin  gtt  Alcohol withdrawal with history of daily consumption -improved -Last drink 6/21 P: Continue CIWA protocol Continue with Librium taper Seizure precautions Thiamine , folate, multivitamin  Hyponatremia Hypokalemia P: Supplement as needed Trend BMP  DM2 with hyperglycemia P: Increase SSI to moderate scale CBG checks every 4 CBG goal 1 4180 Slightly increase Semglee  to 15 daily  Chronic HFpEF HTN P: Continue carvedilol   Continuous telemetry Posted IV antihypertensives  Rheumatoid arthritis -Doesn't appear as though he is on any home medications P: Supportive care  Best Practice (right click and Reselect all SmartList Selections daily)   Diet/type: tubefeeds DVT prophylaxis systemic heparin  Pressure ulcer(s): pressure ulcer assessment deferred  GI prophylaxis: PPI Lines: N/A Foley:  N/A Code Status:  full code Last date of multidisciplinary goals of care discussion Pending, improving   Signature   Leita SAUNDERS Benjamyn Hestand, PA-C [facility] See Amion for pager If no response to pager , please call [phone] until 7pm After 7:00 pm call Elink  [phone]

## 2024-04-27 NOTE — Progress Notes (Signed)
 Patient coughed up a large blood clot after chest PT. Heparin  drip stopped and Vann MD notified. No active bleed seen after clot cleared

## 2024-04-27 NOTE — Progress Notes (Addendum)
 PROGRESS NOTE  [name] FMW:[id number] DOB: [birth year] DOA: 04/19/2024 PCP: [name], NP   LOS: 8 days   Brief Narrative / Interim history: 68 year old male with history of DM2, HTN, HLD, rheumatoid arthritis, prior CVA, tobacco and alcohol use comes into the hospital and is admitted on 6/23 with abdominal pain, found to have necrotizing pancreatitis.  Hospital course complicated by persistent abdominal pain requiring NG tube placement for feeding, also hypoxic respiratory failure requiring high flow oxygen.  He was also found to have a thrombus extending from the SMV and the portal splenic confluence.  Has been placed on anticoagulation.  GI consulted early on but they signed off.  Surgery also consulted due to necrotizing pancreatitis  Subjective / 24h Interval events: Throat feeling better, less abdominal pain as well  Assesement and Plan: Principal Problem:   Pancreatitis, alcoholic, acute Active Problems:   Malnutrition of moderate degree   Hemoptysis   Acute hypoxemic respiratory failure (HCC)   Principal problem Acute necrotizing pancreatitis, EtOH induced -imaging on admission showed acute pancreatitis with findings suggestive of necrotizing pancreatitis.  With no abscess or pseudocyst were seen.  GI consulted, now signed off.  General surgery also consulted, no indication for surgery. - Continue supportive care -tolerating clears-have asked nursing to document how much he is eating so consideration to stopping tube feeds  Acute hypoxic respiratory failure - Chest x-ray does not show any significant ARDS, there is a discrepancy between how the chest x-ray looks and how much oxygen he needs -Wean O2 as able -abx for aspiration pna-no fever since antibiotics started  SMV portal splenic thrombus-resumed anticoagulation.  This is likely due to pancreatitis.  Thrombus is nonocclusive.  Essential hypertension-pretty hypertensive on admission, has been placed on  amlodipine , Coreg .  Continue  EtOH use-no withdrawals noted, status post Librium taper  Hypokalemia-replace   Chronic diastolic CHF-now being diuresed with furosemide   RA-does not appear to be on any medications at home  Anemia of critical illness-hemoglobin stable, no bleeding  Thrombocytopenia-in the setting of EtOH use  DM2, with hyperglycemia-continue insulin - adjust for better coverage-- carb mod diet   Addendum Hemoptysis - Patient coughed large blood clot on 7/1 so heparin  has been held after discussion with pulmonary   Scheduled Meds:  amLODipine   10 mg Per J Tube Daily   carvedilol   12.5 mg Per Tube BID WC   Chlorhexidine  Gluconate Cloth  6 each Topical Daily   feeding supplement (PROSource TF20)  60 mL Per Tube Daily   folic acid   1 mg Intravenous Daily   furosemide   20 mg Intravenous Q12H   insulin  aspart  0-15 Units Subcutaneous Q4H   insulin  aspart  2 Units Subcutaneous Q4H   insulin  glargine-yfgn  15 Units Subcutaneous Daily   multivitamin  15 mL Per Tube Daily   nicotine   21 mg Transdermal Daily   thiamine   100 mg Per Tube Daily   Or   thiamine   100 mg Intravenous Daily   Continuous Infusions:  cefTRIAXone  (ROCEPHIN )  IV Stopped (04/27/24 1021)   feeding supplement (OSMOLITE 1.5 CAL) 55 mL/hr at 04/27/24 1043   heparin  1,350 Units/hr (04/27/24 1043)   PRN Meds:.acetaminophen , docusate, hydrALAZINE , HYDROmorphone  (DILAUDID ) injection, labetalol , ondansetron  (ZOFRAN ) IV, mouth rinse, phenol, polyethylene glycol  Current Outpatient Medications  Medication Instructions   amLODipine  (NORVASC ) 10 mg, Oral, Daily   atorvastatin  (LIPITOR) 40 mg, Daily   carvedilol  (COREG  CR) 80 mg, Daily   cetirizine  (ZYRTEC ) 10 mg, Oral, Daily  clopidogrel  (PLAVIX ) 75 mg, Oral, Daily   dapagliflozin  propanediol (FARXIGA ) 10 mg, Oral, Daily before breakfast   fenofibrate micronized (LOFIBRA) 134 mg, Every evening   lidocaine  (LIDODERM ) 5 % 1 patch, Transdermal, Every 24  hours, Apply 1 patch to area of pain for up to 12 hours.  Then remove patch for full 12 hours before reapplying a new patch.   metFORMIN  (GLUCOPHAGE ) 500 mg, Oral, Daily with breakfast   metoprolol  tartrate (LOPRESSOR ) 50 mg, Oral, 2 times daily, Take one two times a day for blood pressure.   oxyCODONE -acetaminophen  (PERCOCET) 10-325 MG tablet 1 tablet, 5 times daily   pantoprazole  (PROTONIX ) 40 mg, 2 times daily   rivaroxaban  (XARELTO ) 10 mg, Oral, Daily, For 30 days to prevent blood clots after surgery   sennosides-docusate sodium  (SENOKOT-S) 8.6-50 MG tablet 2 tablets, Oral, Daily PRN   tamsulosin  (FLOMAX ) 0.8 mg, Oral, Daily   traZODone  (DESYREL ) 100 mg, Oral, Daily at bedtime, Take one table by mouth at bedtime as needed for sleep.    Diet Orders (From admission, onward)     Start     Ordered   04/26/24 1254  Diet clear liquid Room service appropriate? Yes; Fluid consistency: Thin  Diet effective now       Comments: CARB MOD  Question Answer Comment  Room service appropriate? Yes   Fluid consistency: Thin      04/26/24 1253            DVT prophylaxis:    Lab Results  Component Value Date   PLT 223 04/27/2024      Code Status: Full Code  Family Communication: no family at bedside   Status is: Inpatient Remains inpatient appropriate because: severity of illness  Level of care: ICU  Consultants:  PCCM GI Surgery   Objective: Vitals:   04/27/24 0500 04/27/24 0700 04/27/24 0800 04/27/24 1000  BP: (!) 109/51 (!) 123/52  (!) 126/52  Pulse:  80  82  Resp: (!) 22 (!) 21  20  Temp:   98.1 F (36.7 C)   TempSrc:   Oral   SpO2:  95%  97%  Weight:      Height:        Intake/Output Summary (Last 24 hours) at 04/27/2024 1043 Last data filed at 04/27/2024 1043 Gross per 24 hour  Intake 1861.41 ml  Output 1132 ml  Net 729.41 ml   Wt Readings from Last 3 Encounters:  04/26/24 54.1 kg  04/02/24 66.7 kg  12/02/23 70.8 kg    Examination:   General:  Appearance:    Thin male in no acute distress     Lungs:      respirations unlabored, on nasal cannula  Heart:    Normal heart rate.   MS:   All extremities are intact.   Neurologic:   Awake, alert, able to make needs known     Data Reviewed: I have independently reviewed following labs and imaging studies   CBC Recent Labs  Lab 04/24/24 0024 04/24/24 0524 04/25/24 0513 04/26/24 0340 04/27/24 0008  WBC 12.3* 12.2* 15.5* 18.7* 21.2*  HGB 11.0* 10.5* 10.6* 10.3* 9.2*  HCT 33.8* 31.5* 33.1* 32.0* 27.5*  PLT 96* 99* 139* 183 223  MCV 102.1* 100.6* 104.7* 101.6* 100.7*  MCH 33.2 33.5 33.5 32.7 33.7  MCHC 32.5 33.3 32.0 32.2 33.5  RDW 14.1 14.4 14.1 13.3 12.9  LYMPHSABS 2.3  --   --   --   --   MONOABS 1.6*  --   --   --   --  EOSABS 0.0  --   --   --   --   BASOSABS 0.0  --   --   --   --     Recent Labs  Lab 04/23/24 0438 04/23/24 1647 04/24/24 0524 04/24/24 1712 04/25/24 0513 04/26/24 0340 04/27/24 0008  NA 137  --  141  --  139 131* 130*  K 3.2*  --  3.4*  --  3.9 3.8 3.5  CL 104  --  108  --  105 96* 92*  CO2 25  --  24  --  24 24 28   GLUCOSE 171*  --  198*  --  302* 312* 155*  BUN 16  --  15  --  17 16 17   CREATININE 0.75  --  0.71  --  0.78 0.80 0.72  CALCIUM  8.3*  --  8.7*  --  8.8* 8.7* 8.5*  AST 62*  --  51*  --  47* 77* 51*  ALT 30  --  32  --  39 51* 45*  ALKPHOS 64  --  87  --  93 112 101  BILITOT 1.6*  --  1.0  --  1.3* 1.0 0.7  ALBUMIN  2.3*  --  2.4*  --  2.5* 2.5* 2.3*  MG 2.1 2.1 2.1 2.0 1.9 1.8  --     ------------------------------------------------------------------------------------------------------------------ No results for input(s): CHOL, HDL, LDLCALC, TRIG, CHOLHDL, LDLDIRECT in the last 72 hours.  Lab Results  Component Value Date   HGBA1C 6.1 (H) 04/02/2024   ------------------------------------------------------------------------------------------------------------------ No results for input(s): TSH, T4TOTAL,  T3FREE, THYROIDAB in the last 72 hours.  Invalid input(s): FREET3  Cardiac Enzymes No results for input(s): CKMB, TROPONINI, MYOGLOBIN in the last 168 hours.  Invalid input(s): CK ------------------------------------------------------------------------------------------------------------------ No results found for: BNP  CBG: Recent Labs  Lab 04/26/24 1605 04/26/24 1951 04/26/24 2352 04/27/24 0339 04/27/24 0729  GLUCAP 253* 257* 151* 235* 263*    Recent Results (from the past 240 hours)  Blood Culture (routine x 2)     Status: None   Collection Time: 04/19/24  1:45 PM   Specimen: BLOOD LEFT ARM  Result Value Ref Range Status   Specimen Description   Final    BLOOD LEFT ARM Performed at [facility], [address] [place], [place] [address]    Special Requests   Final    BOTTLES DRAWN AEROBIC AND ANAEROBIC Blood Culture adequate volume Performed at [facility], [address] [place], [place] [address]    Culture   Final    NO GROWTH 5 DAYS Performed at [facility], [address], [place], [place] [address]    Report Status 04/24/2024 FINAL  Final  Resp panel by RT-PCR (RSV, Flu A&B, Covid) Anterior Nasal Swab     Status: None   Collection Time: 04/19/24  2:49 PM   Specimen: Anterior Nasal Swab  Result Value Ref Range Status   SARS Coronavirus 2 by RT PCR NEGATIVE NEGATIVE Final    Comment: (NOTE) SARS-CoV-2 target nucleic acids are NOT DETECTED.  The SARS-CoV-2 RNA is generally detectable in upper respiratory specimens during the acute phase of infection. The lowest concentration of SARS-CoV-2 viral copies this assay can detect is 138 copies/mL. A negative result does not preclude SARS-Cov-2 infection and should not be used as the sole basis for treatment or other patient management decisions. A negative result may occur with  improper specimen collection/handling, submission of specimen other than  nasopharyngeal swab, presence of viral mutation(s) within the areas targeted by this assay, and inadequate number of viral copies(<138 copies/mL). A negative result must be combined with clinical observations, patient history, and epidemiological information. The expected result is Negative.  Fact Sheet for Patients:  BloggerCourse.com  Fact Sheet for Healthcare Providers:  SeriousBroker.it  This test is no t yet approved or cleared by the [place]  FDA and  has been authorized for detection and/or diagnosis of SARS-CoV-2 by FDA under an Emergency Use Authorization (EUA). This EUA will remain  in effect (meaning this test can be used) for the duration of the COVID-19 declaration under Section 564(b)(1) of the Act, 21 U.S.C.section 360bbb-3(b)(1), unless the authorization is terminated  or revoked sooner.       Influenza A by PCR NEGATIVE NEGATIVE Final   Influenza B by PCR NEGATIVE NEGATIVE Final    Comment: (NOTE) The Xpert Xpress SARS-CoV-2/FLU/RSV plus assay is intended as an aid in the diagnosis of influenza from Nasopharyngeal swab specimens and should not be used as a sole basis for treatment. Nasal washings and aspirates are unacceptable for Xpert Xpress SARS-CoV-2/FLU/RSV testing.  Fact Sheet for Patients: BloggerCourse.com  Fact Sheet for Healthcare Providers: SeriousBroker.it  This test is not yet approved or cleared by the [place]  FDA and has been authorized for detection and/or diagnosis of SARS-CoV-2 by FDA under an Emergency Use Authorization (EUA). This EUA will remain in effect (meaning this test can be used) for the duration of the COVID-19 declaration under Section 564(b)(1) of the Act, 21 U.S.C. section 360bbb-3(b)(1), unless the authorization is terminated or revoked.     Resp Syncytial Virus by PCR NEGATIVE NEGATIVE Final    Comment:  (NOTE) Fact Sheet for Patients: BloggerCourse.com  Fact Sheet for Healthcare Providers: SeriousBroker.it  This test is not yet approved or cleared by the [place]  FDA and has been authorized for detection and/or diagnosis of SARS-CoV-2 by FDA under an Emergency Use Authorization (EUA). This EUA will remain in effect (meaning this test can be used) for the duration of the COVID-19 declaration under Section 564(b)(1) of the Act, 21 U.S.C. section 360bbb-3(b)(1), unless the authorization is terminated or revoked.  Performed at [facility], [address] [place], [place] [address]   Urine Culture     Status: Abnormal   Collection Time: 04/19/24  3:55 PM   Specimen: Urine, Random  Result Value Ref Range Status   Specimen Description   Final    URINE, RANDOM Performed at [facility], [address] [place], [place] [address]    Special Requests   Final    NONE Reflexed from [id number] Performed at [facility], [address], [place], [place] [address]    Culture (A)  Final    <10,000 COLONIES/mL INSIGNIFICANT GROWTH Performed at [facility], [address] [place], [place] [address]    Report Status 04/20/2024 FINAL  Final  MRSA Next Gen by PCR, Nasal     Status: None   Collection Time: 04/19/24  5:47 PM   Specimen: Nasal Mucosa; Nasal Swab  Result Value Ref Range Status   MRSA by PCR Next Gen NOT DETECTED NOT DETECTED Final    Comment: (NOTE) The GeneXpert MRSA Assay (FDA approved for NASAL specimens only), is one component of a comprehensive MRSA colonization surveillance program. It is not intended to diagnose MRSA infection nor to guide or monitor treatment for MRSA infections. Test performance is not FDA approved  in patients less than 1 years old. Performed at [facility], [address] [place], [place] [address]   Culture,  blood (Routine X 2) w Reflex to ID Panel     Status: None (Preliminary result)   Collection Time: 04/26/24  5:41 AM   Specimen: BLOOD  Result Value Ref Range Status   Specimen Description   Final    BLOOD BLOOD RIGHT ARM Performed at [facility], [address] [place], [place] [address]    Special Requests   Final    BOTTLES DRAWN AEROBIC ONLY Blood Culture adequate volume Performed at [facility], [address], [place], [place] [address]    Culture   Final    NO GROWTH < 24 HOURS Performed at [facility], [address], [place], [place] [address]    Report Status PENDING  Incomplete  Culture, blood (Routine X 2) w Reflex to ID Panel     Status: None (Preliminary result)   Collection Time: 04/26/24  5:41 AM   Specimen: BLOOD  Result Value Ref Range Status   Specimen Description   Final    BLOOD BLOOD RIGHT ARM Performed at [facility], [address], [place], [place] [address]    Special Requests   Final    BOTTLES DRAWN AEROBIC ONLY Blood Culture adequate volume Performed at [facility], [address] [place], [place] [address]    Culture   Final    NO GROWTH < 24 HOURS Performed at [facility], [address], [place], [place] [address]    Report Status PENDING  Incomplete     Radiology Studies: No results found.    Harlene Bowl DO  [facility]  Between 7 am - 7 pm I am available, please contact me via Amion (for emergencies) or Securechat (non urgent messages)  Between 7 pm - 7 am I am not available, please contact night coverage MD/APP via Amion

## 2024-04-27 NOTE — Progress Notes (Signed)
 Pharmacy Brief Note - Heparin    Pt anticoagulated with heparin  for SMV thrombus. History of hemoptysis requiring anticoagulation to be held from 6/28 - 6/30.   Pt coughed up large blood clot this morning, heparin  held @ ~11:15 per CCM. No additional signs of bleeding, verbal order from CCM to resume heparin .   Resume heparin  at 1350 units/hr. Check heparin  level 6-8 hours after resumed. CBC with AM labs. Monitor for signs of bleeding.   Ronal CHRISTELLA Rav, PharmD 04/27/24 3:26 PM

## 2024-04-27 NOTE — Progress Notes (Signed)
 PHARMACY - ANTICOAGULATION CONSULT NOTE  Pharmacy Consult for heparin   Indication: SMV thrombus  Allergies  Allergen Reactions   Lisinopril  Swelling and Other (See Comments)    angioedema   Citalopram Nausea Only   Morphine  Nausea And Vomiting   Nsaids Other (See Comments)    Stomach Irritability    Paroxetine Nausea And Vomiting    Patient Measurements: Height: 5' 7 (170.2 cm) Weight: 54.1 kg (119 lb 4.3 oz) IBW/kg (Calculated) : 66.1 HEPARIN  DW (KG): 65.6  Vital Signs: Temp: 98.6 F (37 C) (07/01 0300) Temp Source: Oral (07/01 0300) BP: 109/51 (07/01 0500) Pulse Rate: 83 (07/01 0300)  Labs: Recent Labs    04/25/24 0513 04/26/24 0340 04/27/24 0008  HGB 10.6* 10.3* 9.2*  HCT 33.1* 32.0* 27.5*  PLT 139* 183 223  HEPARINUNFRC  --   --  0.29*  CREATININE 0.78 0.80 0.72    Estimated Creatinine Clearance: 68.6 mL/min (by C-G formula based on SCr of 0.72 mg/dL).   Medical History: Past Medical History:  Diagnosis Date   Alcohol abuse    Allergic rhinitis    CHF (congestive heart failure) (HCC)    Diabetes mellitus without complication (HCC)    'sometime last yr'--type 2   GERD (gastroesophageal reflux disease)    Hepatitis    he thinks its hep b or c   Hyperlipidemia    Hypertension    Osteoarthritis    Pancreatitis    Rheumatoid arthritis(714.0)    Stroke (HCC)    Tobacco user     Assessment: Patient is a 68 y.o M with hx EtOH abuse and pancreatitis who presented to the ED on 04/19/24 with c/o abdominal pain.  Abdominal CT on 04/19/24 showed findings with concern for necrotizing pancreatitis and thrombus extending from the SMV into the porta splenic confluence and proximal main portal vein.    Pt was started on heparin  drip for anticoagulation on admission. Heparin  was stopped in the afternoon on 6/28 for hemoptysis. Hemoptysis has resolved, pharmacy consulted to restart heparin  drip.   Today, 04/27/2024: Heparin  level 0.39, therapeutic, on IV heparin   1350 units/hr CBC: Hgb decreased to 9.2, Plt increased to 223. No bleeding or infusion related concerns reported by RN   Goal of Therapy:  Heparin  level 0.3-0.7 units/ml Monitor platelets by anticoagulation protocol: Yes   Plan:  No bolus given recent hemoptysis Continue heparin  infusion 1350 units/hr Check 8 hour confirmatory heparin  level HL, CBC daily Monitor for signs of bleeding, hemoptysis    Wanda Hasting PharmD, BCPS [facility] main pharmacy [phone] 04/27/2024 7:07 AM

## 2024-04-28 DIAGNOSIS — J690 Pneumonitis due to inhalation of food and vomit: Secondary | ICD-10-CM | POA: Diagnosis not present

## 2024-04-28 DIAGNOSIS — J9601 Acute respiratory failure with hypoxia: Secondary | ICD-10-CM | POA: Diagnosis not present

## 2024-04-28 DIAGNOSIS — K8521 Alcohol induced acute pancreatitis with uninfected necrosis: Secondary | ICD-10-CM | POA: Diagnosis not present

## 2024-04-28 LAB — COMPREHENSIVE METABOLIC PANEL WITH GFR
ALT: 40 U/L (ref 0–44)
AST: 47 U/L — ABNORMAL HIGH (ref 15–41)
Albumin: 2.2 g/dL — ABNORMAL LOW (ref 3.5–5.0)
Alkaline Phosphatase: 99 U/L (ref 38–126)
Anion gap: 11 (ref 5–15)
BUN: 17 mg/dL (ref 8–23)
CO2: 27 mmol/L (ref 22–32)
Calcium: 8.3 mg/dL — ABNORMAL LOW (ref 8.9–10.3)
Chloride: 90 mmol/L — ABNORMAL LOW (ref 98–111)
Creatinine, Ser: 0.81 mg/dL (ref 0.61–1.24)
GFR, Estimated: >60 mL/min (ref >60)
Glucose, Bld: 154 mg/dL — ABNORMAL HIGH (ref 70–99)
Potassium: 3.8 mmol/L (ref 3.5–5.1)
Sodium: 128 mmol/L — ABNORMAL LOW (ref 135–145)
Total Bilirubin: 0.9 mg/dL (ref 0.0–1.2)
Total Protein: 6.5 g/dL (ref 6.5–8.1)

## 2024-04-28 LAB — GLUCOSE, CAPILLARY
Glucose-Capillary: 210 mg/dL — ABNORMAL HIGH (ref 70–99)
Glucose-Capillary: 303 mg/dL — ABNORMAL HIGH (ref 70–99)
Glucose-Capillary: 269 mg/dL — ABNORMAL HIGH (ref 70–99)
Glucose-Capillary: 199 mg/dL — ABNORMAL HIGH (ref 70–99)
Glucose-Capillary: 252 mg/dL — ABNORMAL HIGH (ref 70–99)

## 2024-04-28 LAB — CBC
HCT: 25.5 % — ABNORMAL LOW (ref 39.0–52.0)
Hemoglobin: 8.7 g/dL — ABNORMAL LOW (ref 13.0–17.0)
MCH: 33.6 pg (ref 26.0–34.0)
MCHC: 34.1 g/dL (ref 30.0–36.0)
MCV: 98.5 fL (ref 80.0–100.0)
Platelets: 278 10*3/uL (ref 150–400)
RBC: 2.59 MIL/uL — ABNORMAL LOW (ref 4.22–5.81)
RDW: 12.4 % (ref 11.5–15.5)
WBC: 24.8 10*3/uL — ABNORMAL HIGH (ref 4.0–10.5)
nRBC: 0 % (ref 0.0–0.2)

## 2024-04-28 LAB — HEPARIN LEVEL (UNFRACTIONATED): Heparin Unfractionated: 0.35 [IU]/mL (ref 0.30–0.70)

## 2024-04-28 LAB — FACTOR 10 ASSAY: Factor X Activity: 107 % (ref 76–183)

## 2024-04-28 MED ORDER — OXYCODONE HCL 5 MG PO TABS
5.0000 mg | ORAL_TABLET | Freq: Once | ORAL | Status: AC
  Administered 2024-04-28: 5 mg via NASOGASTRIC
  Filled 2024-04-28: qty 1

## 2024-04-28 NOTE — Progress Notes (Signed)
 PHARMACY - ANTICOAGULATION CONSULT NOTE  Pharmacy Consult for heparin   Indication: SMV thrombus  Allergies  Allergen Reactions   Lisinopril  Swelling and Other (See Comments)    angioedema   Citalopram Nausea Only   Morphine  Nausea And Vomiting   Nsaids Other (See Comments)    Stomach Irritability    Paroxetine Nausea And Vomiting    Patient Measurements: Height: 5' 7 (170.2 cm) Weight: 54.1 kg (119 lb 4.3 oz) IBW/kg (Calculated) : 66.1 HEPARIN  DW (KG): 65.6  Vital Signs: Temp: 99.9 F (37.7 C) (07/02 0100) Temp Source: Oral (07/02 0100) BP: 116/43 (07/02 0100) Pulse Rate: 76 (07/02 0100)  Labs: Recent Labs    04/26/24 0340 04/27/24 0008 04/27/24 1006 04/28/24 0102  HGB 10.3* 9.2*  --  8.7*  HCT 32.0* 27.5*  --  25.5*  PLT 183 223  --  278  HEPARINUNFRC  --  0.29* 0.39 0.35  CREATININE 0.80 0.72  --  0.81    Estimated Creatinine Clearance: 67.7 mL/min (by C-G formula based on SCr of 0.81 mg/dL).   Medical History: Past Medical History:  Diagnosis Date   Alcohol abuse    Allergic rhinitis    CHF (congestive heart failure) (HCC)    Diabetes mellitus without complication (HCC)    'sometime last yr'--type 2   GERD (gastroesophageal reflux disease)    Hepatitis    he thinks its hep b or c   Hyperlipidemia    Hypertension    Osteoarthritis    Pancreatitis    Rheumatoid arthritis(714.0)    Stroke (HCC)    Tobacco user     Assessment: Patient is a 68 y.o M with hx EtOH abuse and pancreatitis who presented to the ED on 04/19/24 with c/o abdominal pain.  Abdominal CT on 04/19/24 showed findings with concern for necrotizing pancreatitis and thrombus extending from the SMV into the porta splenic confluence and proximal main portal vein.    Pt was started on heparin  drip for anticoagulation on admission. Heparin  was stopped in the afternoon on 6/28 for hemoptysis. Hemoptysis has resolved, pharmacy consulted to restart heparin  drip.   Today, 04/28/2024: Heparin   level 0.35, therapeutic, on IV heparin  1350 units/hr CBC: Hgb decreased to 8.7, Pltc WNL No bleeding or infusion related concerns reported by RN   Goal of Therapy:  Heparin  level 0.3-0.7 units/ml Monitor platelets by anticoagulation protocol: Yes   Plan:  Continue heparin  infusion 1350 units/hr HL, CBC daily Monitor for signs of bleeding, hemoptysis   Rosaline Millet, PharmD, BCPS 04/28/2024 1:56 AM

## 2024-04-28 NOTE — Plan of Care (Signed)
  Problem: Clinical Measurements: Goal: Ability to maintain clinical measurements within normal limits will improve Outcome: Not Progressing Goal: Respiratory complications will improve Outcome: Not Progressing   Problem: Nutrition: Goal: Adequate nutrition will be maintained Outcome: Not Progressing   Problem: Coping: Goal: Level of anxiety will decrease Outcome: Not Progressing   Problem: Pain Managment: Goal: General experience of comfort will improve and/or be controlled Outcome: Not Progressing

## 2024-04-28 NOTE — Progress Notes (Signed)
 NAME:  [name], MRN:  [id number], DOB:  Jul 30, 1956, LOS: 9 ADMISSION DATE:  04/19/2024, CONSULTATION DATE:  6/23 REFERRING MD:  Dr. Dreama , CHIEF COMPLAINT:  Pancreatitis   History of Present Illness:  68 year old male with PMH as below, which is significant for alcohol abuse, RA, pancreatitis, CHF, and DM. He was admitted in February of this year for a total hip arthoplasty for osteoarthritis. Drinks 10 beers per day. The perioperative course was uncomplicated. He now is presenting to [facility] ED 6/23 with complaints of abdominal pain for 3 days. Pain was diffuse, but worse over the right abdominal quadrants. CT of the abdomen demonstrated pancreatitis with necrosis. Lipase 283. CT also noted thrombus extending from the SMV into the portal splenic confluence. He was given IV fluids and started on antibiotics. He was also hypertensive and tachycardic, which was treated effectively with labetalol . GI was consulted by the emergency department and PCCM was asked to admit for severe pancreatitis.   Pertinent  Medical History   has a past medical history of Alcohol abuse, Allergic rhinitis, CHF (congestive heart failure) (HCC), Diabetes mellitus without complication (HCC), GERD (gastroesophageal reflux disease), Hepatitis, Hyperlipidemia, Hypertension, Osteoarthritis, Pancreatitis, Rheumatoid arthritis(714.0), Stroke (HCC), and Tobacco user.  Significant Hospital Events: Including procedures, antibiotic start and stop dates in addition to other pertinent events   6/23 admit for necrotizing pancreatitis 6/24 Hypertensive overnight treated with PRN IV medications, Still having diffuse abdominal pain 6/25 Intermittent episode of confusion/combativeness overnight  6/26 Lightly sedated with bilateral rhonchi, weak cough 6/27 - Sedated this morning with precedex . Some agitation overnight. Tachypenic but not paradoxical 6/28 More alert, Had some hemoptysis  yesterday -> heprin continued, Pulm  toilet advised  6/29 Heparin  held due to hemoptysis  6/30 Febrile overnight with T-max 101.9, Ceftriaxone  started  7/1 on heparin  without hemoptysis, calm 7/2 down to 4L [place] and no bleeding overnight on heparin   Interim History / Subjective:  States he had a good night and is feeling ok  Objective    Blood pressure (!) 142/48, pulse 81, temperature 98.8 F (37.1 C), temperature source Oral, resp. rate 16, height 5' 7 (1.702 m), weight 54.1 kg, SpO2 94%.        Intake/Output Summary (Last 24 hours) at 04/28/2024 0744 Last data filed at 04/28/2024 0400 Gross per 24 hour  Intake 3127.89 ml  Output 2970 ml  Net 157.89 ml   Filed Weights   04/25/24 0500 04/26/24 0429 04/28/24 0500  Weight: 65.3 kg 54.1 kg 54.1 kg    Examination:  General: Acute on chronically ill appearing middle aged male lying in bed, in NAD HEENT: [place]/AT, MM pink/moist, PERRL,  Neuro:  resting, answers questions appropriately, no focal deficits  CV: s1s2 regular rate and rhythm, no murmur, rubs, or gallops,  PULM:  mildly diminished in the bases , no dyspnea or tachypnea on    GI: soft, mildly distended with diffuse mild TTP in the upper quadrants with faint BS Extremities: warm/dry, 1+ pedal edema   Labs: Hgb 8.7 (9.2 yesterday)  Resolved problem list  Lactic acidosis -Secondary to pancreatitis  AKI  Assessment and Plan   Acute necrotizing pancreatitis -Severe pancreatitis. History of alcoholic pancreatitis. Also takes Farxiga  which could possibly be a contributor.  -Surgery signed off 6/26 P: Continue supportive care  Multimodal pain control  Encourage oral intake with continued tube feeds until oral intake adequate No current critical care needs, PCCM will sign off, please reengage for any concerns  Acute hypoxic respiratory failure  Aspiration pneumonia - Initially secondary to sedation in the setting of EtOH withdrawal but it appears patient has now likely developed an aspiration pneumonia  given fever and increased leukocytosis with extensive consolidation in lung bases bilaterally as of 6/29 Hemoptysis  -First noticed 6/28, Heparin  held 6/29 Large clot on 7/1 after heparin  was resumed, but no further bleeding so resumed again later 7/1 P: Continue empiric ceftriaxone   Sputum culture if able to produce Trend CBC and fever curve Pulmonary hygiene Minimize sedation Mobilize Heparin  resumed, monitor for signs of bleeding  SMV - portosplenic thrombus -Secondary to pancreatitis. Nonocclusive P: Continue heparin  gtt  Alcohol withdrawal with history of daily consumption -improved -Last drink 6/21, is not showing signs of withdrawal and is past peak withdrawal window  P: Seizure precautions Thiamine , folate, multivitamin  Hyponatremia Hypokalemia P: Supplement as needed Trend BMP  DM2 with hyperglycemia P: Increase SSI to moderate scale CBG checks every 4 CBG goal 1 4180 Slightly increase Semglee  to 15 daily  Chronic HFpEF HTN P: Continue carvedilol   Continuous telemetry Posted IV antihypertensives  Rheumatoid arthritis -Doesn't appear as though he is on any home medications P: Supportive care  Best Practice (right click and Reselect all SmartList Selections daily)   Diet/type: tubefeeds DVT prophylaxis systemic heparin  Pressure ulcer(s): pressure ulcer assessment deferred  GI prophylaxis: PPI Lines: N/A Foley:  N/A Code Status:  full code Last date of multidisciplinary goals of care discussion Pending, improving   Signature   Leita SAUNDERS Tieler Cournoyer, PA-C [facility] See Amion for pager If no response to pager , please call [phone] until 7pm After 7:00 pm call Elink  [phone]

## 2024-04-28 NOTE — Plan of Care (Signed)
  Problem: Education: Goal: Knowledge of General Education information will improve Description: Including pain rating scale, medication(s)/side effects and non-pharmacologic comfort measures Outcome: Progressing   Problem: Clinical Measurements: Goal: Respiratory complications will improve Outcome: Progressing   Problem: Activity: Goal: Risk for activity intolerance will decrease Outcome: Progressing   Problem: Nutrition: Goal: Adequate nutrition will be maintained Outcome: Progressing   Problem: Pain Managment: Goal: General experience of comfort will improve and/or be controlled Outcome: Progressing   Problem: Skin Integrity: Goal: Risk for impaired skin integrity will decrease Outcome: Progressing

## 2024-04-28 NOTE — Progress Notes (Signed)
 PROGRESS NOTE    JIGAR ZIELKE  FMW:[id number] DOB: [birth year] DOA: 04/19/2024 PCP: [name], NP   Brief Narrative:  68 year old male with history of DM2, HTN, HLD, rheumatoid arthritis, prior CVA, tobacco and alcohol use comes into the hospital and is admitted on 6/23 with abdominal pain, found to have necrotizing pancreatitis. Hospital course complicated by persistent abdominal pain requiring NG tube placement for feeding, also hypoxic respiratory failure requiring high flow oxygen. He was also found to have a thrombus extending from the SMV and the portal splenic confluence. Has been placed on anticoagulation. GI consulted early on but they signed off. Surgery also consulted due to necrotizing pancreatitis   Assessment & Plan:   Principal Problem:   Pancreatitis, alcoholic, acute Active Problems:   Malnutrition of moderate degree   Hemoptysis   Acute hypoxemic respiratory failure (HCC)  Acute necrotizing pancreatitis, EtOH induced -imaging on admission showed acute pancreatitis with findings suggestive of necrotizing pancreatitis.  With no abscess or pseudocyst were seen.  GI consulted, now signed off.  General surgery also consulted, no indication for surgery and they signed off as well.  Continue multimodal pain management and supportive care.  Tolerating clear liquid diet.  Advance to full liquid diet.  Continue tube feeds.  Wean when patient able to have adequate oral nutrition.   Acute hypoxic respiratory failure and sepsis secondary to aspiration pneumonia, sepsis not present on admission: - Chest x-ray does not show any significant ARDS, however CT chest completed on 04/25/2024 shows extensive consolidation in the lung bases.  Patient's leukocytosis is worsening at 24 today and he has developed fever again with latest temperature of 102 at 11:30 PM on 04/27/2024.  Has been restarted on Rocephin  since 04/26/2024.  Currently on 3 L of high flow oxygen, will wean as able to.   Encouraged incentive spirometry.   SMV portal splenic thrombus/hemoptysis- This is likely due to pancreatitis.  Thrombus is nonocclusive.  Heparin  was held due to hemoptysis on early morning of 04/27/2024 but resumed later on the same day.  Monitor closely.   Essential hypertension-pretty hypertensive on admission, has been placed on amlodipine , Coreg .  Continue that, blood pressure improving.   EtOH use-no withdrawals noted, status post Librium taper, out of the window for withdrawal.   Hypokalemia-replaced/resolved.  Mild acute hyponatremia: 128 today.  Monitor for now.   Chronic diastolic CHF-now being diuresed with IV furosemide .   RA-does not appear to be on any medications at home   Anemia of critical illness-hemoglobin with slight drop to 8.7 today.  No indication of transfusion.  Monitor closely and transfuse if less than 7.   Thrombocytopenia-in the setting of EtOH use however this has resolved.   DM2, with hyperglycemia-hyperglycemia, start on Lantus  20 units, continue SSI.   DVT prophylaxis: Heparin    Code Status: Full Code  Family Communication:  None present at bedside.  Plan of care discussed with patient in length and he/she verbalized understanding and agreed with it.  Status is: Inpatient Remains inpatient appropriate because: Still sick, needs management of multiple medical problems as mentioned above.   Estimated body mass index is 18.68 kg/m as calculated from the following:   Height as of this encounter: 5' 7 (1.702 m).   Weight as of this encounter: 54.1 kg.    Nutritional Assessment: Body mass index is 18.68 kg/m.SABRA Seen by dietician.  I agree with the assessment and plan as outlined below: Nutrition Status: Nutrition Problem: Moderate Malnutrition Etiology: chronic illness (alcohol abuse;  CHF) Signs/Symptoms: mild fat depletion, moderate muscle depletion Interventions: Refer to RD note for recommendations, Tube feeding  . Skin Assessment: I have  examined the patient's skin and I agree with the wound assessment as performed by the wound care RN as outlined below:    Consultants:  Critical care  Procedures:  None  Antimicrobials:  Anti-infectives (From admission, onward)    Start     Dose/Rate Route Frequency Ordered Stop   04/26/24 0845  cefTRIAXone  (ROCEPHIN ) 2 g in sodium chloride  0.9 % 100 mL IVPB        2 g 200 mL/hr over 30 Minutes Intravenous Every 24 hours 04/26/24 0750     04/20/24 1800  meropenem  (MERREM ) 1 g in sodium chloride  0.9 % 100 mL IVPB  Status:  Discontinued        1 g 200 mL/hr over 30 Minutes Intravenous Every 12 hours 04/19/24 1725 04/19/24 1751   04/19/24 1715  meropenem  (MERREM ) 1 g in sodium chloride  0.9 % 100 mL IVPB  Status:  Discontinued        1 g 200 mL/hr over 30 Minutes Intravenous Every 8 hours 04/19/24 1705 04/19/24 1725   04/19/24 1415  cefTRIAXone  (ROCEPHIN ) 2 g in sodium chloride  0.9 % 100 mL IVPB        2 g 200 mL/hr over 30 Minutes Intravenous Once 04/19/24 1404 04/19/24 1458   04/19/24 1415  metroNIDAZOLE  (FLAGYL ) IVPB 500 mg        500 mg 100 mL/hr over 60 Minutes Intravenous  Once 04/19/24 1404 04/19/24 1521   04/19/24 1345  vancomycin  (VANCOREADY) IVPB 1500 mg/300 mL  Status:  Discontinued        1,500 mg 150 mL/hr over 120 Minutes Intravenous  Once 04/19/24 1333 04/19/24 1403   04/19/24 1330  ceFEPIme  (MAXIPIME ) 2 g in sodium chloride  0.9 % 100 mL IVPB  Status:  Discontinued        2 g 200 mL/hr over 30 Minutes Intravenous  Once 04/19/24 1326 04/19/24 1403   04/19/24 1330  metroNIDAZOLE  (FLAGYL ) IVPB 500 mg  Status:  Discontinued        500 mg 100 mL/hr over 60 Minutes Intravenous  Once 04/19/24 1326 04/19/24 1403   04/19/24 1330  vancomycin  (VANCOCIN ) IVPB 1000 mg/200 mL premix  Status:  Discontinued        1,000 mg 200 mL/hr over 60 Minutes Intravenous  Once 04/19/24 1326 04/19/24 1332         Subjective: Seen and examined, he says that he is feeling much better,  other than mild abdominal pain which is improving, he has no complaints.  Denies any nausea or shortness of breath.  Objective: Vitals:   04/28/24 0500 04/28/24 0600 04/28/24 0700 04/28/24 0750  BP: (!) 158/67 (!) 142/48 (!) 153/53   Pulse: 89 81  92  Resp: (!) 21 16 (!) 23 18  Temp:      TempSrc:      SpO2: 94% 94%  93%  Weight: 54.1 kg     Height:        Intake/Output Summary (Last 24 hours) at 04/28/2024 0804 Last data filed at 04/28/2024 0400 Gross per 24 hour  Intake 3127.89 ml  Output 2970 ml  Net 157.89 ml   Filed Weights   04/25/24 0500 04/26/24 0429 04/28/24 0500  Weight: 65.3 kg 54.1 kg 54.1 kg    Examination:  General exam: Appears calm and comfortable  Respiratory system: Clear to auscultation. Respiratory effort normal.  Cardiovascular system: S1 & S2 heard, RRR. No JVD, murmurs, rubs, gallops or clicks. No pedal edema. Gastrointestinal system: Abdomen is nondistended, soft and mild periumbilical tenderness. No organomegaly or masses felt. Normal bowel sounds heard. Central nervous system: Alert and oriented. No focal neurological deficits. Extremities: Symmetric 5 x 5 power. Skin: No rashes, lesions or ulcers  Data Reviewed: I have personally reviewed following labs and imaging studies  CBC: Recent Labs  Lab 04/24/24 0024 04/24/24 0524 04/25/24 0513 04/26/24 0340 04/27/24 0008 04/28/24 0102  WBC 12.3* 12.2* 15.5* 18.7* 21.2* 24.8*  NEUTROABS 7.9*  --   --   --   --   --   HGB 11.0* 10.5* 10.6* 10.3* 9.2* 8.7*  HCT 33.8* 31.5* 33.1* 32.0* 27.5* 25.5*  MCV 102.1* 100.6* 104.7* 101.6* 100.7* 98.5  PLT 96* 99* 139* 183 223 278   Basic Metabolic Panel: Recent Labs  Lab 04/23/24 1647 04/24/24 0524 04/24/24 1712 04/25/24 0513 04/26/24 0340 04/27/24 0008 04/28/24 0102  NA  --  141  --  139 131* 130* 128*  K  --  3.4*  --  3.9 3.8 3.5 3.8  CL  --  108  --  105 96* 92* 90*  CO2  --  24  --  24 24 28 27   GLUCOSE  --  198*  --  302* 312* 155* 154*   BUN  --  15  --  17 16 17 17   CREATININE  --  0.71  --  0.78 0.80 0.72 0.81  CALCIUM   --  8.7*  --  8.8* 8.7* 8.5* 8.3*  MG 2.1 2.1 2.0 1.9 1.8  --   --   PHOS 2.5 3.0 2.8 3.7 3.4  --   --    GFR: Estimated Creatinine Clearance: 67.7 mL/min (by C-G formula based on SCr of 0.81 mg/dL). Liver Function Tests: Recent Labs  Lab 04/24/24 0524 04/25/24 0513 04/26/24 0340 04/27/24 0008 04/28/24 0102  AST 51* 47* 77* 51* 47*  ALT 32 39 51* 45* 40  ALKPHOS 87 93 112 101 99  BILITOT 1.0 1.3* 1.0 0.7 0.9  PROT 6.1* 6.4* 6.5 6.3* 6.5  ALBUMIN  2.4* 2.5* 2.5* 2.3* 2.2*   Recent Labs  Lab 04/26/24 0340  LIPASE 99*   No results for input(s): AMMONIA in the last 168 hours. Coagulation Profile: No results for input(s): INR, PROTIME in the last 168 hours. Cardiac Enzymes: No results for input(s): CKTOTAL, CKMB, CKMBINDEX, TROPONINI in the last 168 hours. BNP (last 3 results) No results for input(s): PROBNP in the last 8760 hours. HbA1C: No results for input(s): HGBA1C in the last 72 hours. CBG: Recent Labs  Lab 04/27/24 1555 04/27/24 1934 04/27/24 2336 04/28/24 0335 04/28/24 0733  GLUCAP 311* 294* 190* 210* 303*   Lipid Profile: No results for input(s): CHOL, HDL, LDLCALC, TRIG, CHOLHDL, LDLDIRECT in the last 72 hours. Thyroid Function Tests: No results for input(s): TSH, T4TOTAL, FREET4, T3FREE, THYROIDAB in the last 72 hours. Anemia Panel: No results for input(s): VITAMINB12, FOLATE, FERRITIN, TIBC, IRON, RETICCTPCT in the last 72 hours. Sepsis Labs: No results for input(s): PROCALCITON, LATICACIDVEN in the last 168 hours.  Recent Results (from the past 240 hours)  Blood Culture (routine x 2)     Status: None   Collection Time: 04/19/24  1:45 PM   Specimen: BLOOD LEFT ARM  Result Value Ref Range Status   Specimen Description   Final    BLOOD LEFT ARM Performed at [facility], [address]  [place], [place] [address]    Special Requests   Final    BOTTLES DRAWN AEROBIC AND ANAEROBIC Blood Culture adequate volume Performed at [facility], [address] [place], [place] [address]    Culture   Final    NO GROWTH 5 DAYS Performed at [facility], [address] [place], [place] [address]    Report Status 04/24/2024 FINAL  Final  Resp panel by RT-PCR (RSV, Flu A&B, Covid) Anterior Nasal Swab     Status: None   Collection Time: 04/19/24  2:49 PM   Specimen: Anterior Nasal Swab  Result Value Ref Range Status   SARS Coronavirus 2 by RT PCR NEGATIVE NEGATIVE Final    Comment: (NOTE) SARS-CoV-2 target nucleic acids are NOT DETECTED.  The SARS-CoV-2 RNA is generally detectable in upper respiratory specimens during the acute phase of infection. The lowest concentration of SARS-CoV-2 viral copies this assay can detect is 138 copies/mL. A negative result does not preclude SARS-Cov-2 infection and should not be used as the sole basis for treatment or other patient management decisions. A negative result may occur with  improper specimen collection/handling, submission of specimen other than nasopharyngeal swab, presence of viral mutation(s) within the areas targeted by this assay, and inadequate number of viral copies(<138 copies/mL). A negative result must be combined with clinical observations, patient history, and epidemiological information. The expected result is Negative.  Fact Sheet for Patients:  BloggerCourse.com  Fact Sheet for Healthcare Providers:  SeriousBroker.it  This test is no t yet approved or cleared by the [place]  FDA and  has been authorized for detection and/or diagnosis of SARS-CoV-2 by FDA under an Emergency Use Authorization (EUA). This EUA will remain  in effect (meaning this test can be used) for the duration of the COVID-19 declaration under Section 564(b)(1) of the Act,  21 U.S.C.section 360bbb-3(b)(1), unless the authorization is terminated  or revoked sooner.       Influenza A by PCR NEGATIVE NEGATIVE Final   Influenza B by PCR NEGATIVE NEGATIVE Final    Comment: (NOTE) The Xpert Xpress SARS-CoV-2/FLU/RSV plus assay is intended as an aid in the diagnosis of influenza from Nasopharyngeal swab specimens and should not be used as a sole basis for treatment. Nasal washings and aspirates are unacceptable for Xpert Xpress SARS-CoV-2/FLU/RSV testing.  Fact Sheet for Patients: BloggerCourse.com  Fact Sheet for Healthcare Providers: SeriousBroker.it  This test is not yet approved or cleared by the [place]  FDA and has been authorized for detection and/or diagnosis of SARS-CoV-2 by FDA under an Emergency Use Authorization (EUA). This EUA will remain in effect (meaning this test can be used) for the duration of the COVID-19 declaration under Section 564(b)(1) of the Act, 21 U.S.C. section 360bbb-3(b)(1), unless the authorization is terminated or revoked.     Resp Syncytial Virus by PCR NEGATIVE NEGATIVE Final    Comment: (NOTE) Fact Sheet for Patients: BloggerCourse.com  Fact Sheet for Healthcare Providers: SeriousBroker.it  This test is not yet approved or cleared by the [place]  FDA and has been authorized for detection and/or diagnosis of SARS-CoV-2 by FDA under an Emergency Use Authorization (EUA). This EUA will remain in effect (meaning this test can be used) for the duration of the COVID-19 declaration under Section 564(b)(1) of the Act, 21 U.S.C. section 360bbb-3(b)(1), unless the authorization is terminated or revoked.  Performed at [facility], [address] [place], [place] [address]   Urine Culture  Status: Abnormal   Collection Time: 04/19/24  3:55 PM   Specimen: Urine, Random  Result Value Ref  Range Status   Specimen Description   Final    URINE, RANDOM Performed at [facility], [address] [place], [place] [address]    Special Requests   Final    NONE Reflexed from [id number] Performed at [facility], [address], [place], [place] [address]    Culture (A)  Final    <10,000 COLONIES/mL INSIGNIFICANT GROWTH Performed at [facility], [address] [place], [place] [address]    Report Status 04/20/2024 FINAL  Final  MRSA Next Gen by PCR, Nasal     Status: None   Collection Time: 04/19/24  5:47 PM   Specimen: Nasal Mucosa; Nasal Swab  Result Value Ref Range Status   MRSA by PCR Next Gen NOT DETECTED NOT DETECTED Final    Comment: (NOTE) The GeneXpert MRSA Assay (FDA approved for NASAL specimens only), is one component of a comprehensive MRSA colonization surveillance program. It is not intended to diagnose MRSA infection nor to guide or monitor treatment for MRSA infections. Test performance is not FDA approved in patients less than 32 years old. Performed at [facility], [address] [place], [place] [address]   Culture, blood (Routine X 2) w Reflex to ID Panel     Status: None (Preliminary result)   Collection Time: 04/26/24  5:41 AM   Specimen: BLOOD  Result Value Ref Range Status   Specimen Description   Final    BLOOD BLOOD RIGHT ARM Performed at [facility], [address] [place], [place] [address]    Special Requests   Final    BOTTLES DRAWN AEROBIC ONLY Blood Culture adequate volume Performed at [facility], [address], [place], [place] [address]    Culture   Final    NO GROWTH 2 DAYS Performed at [facility], [address] [place], [place] [address]    Report Status PENDING  Incomplete  Culture, blood (Routine X 2) w Reflex to ID Panel     Status: None (Preliminary result)   Collection Time: 04/26/24  5:41 AM   Specimen: BLOOD   Result Value Ref Range Status   Specimen Description   Final    BLOOD BLOOD RIGHT ARM Performed at [facility], [address] [place], [place] [address]    Special Requests   Final    BOTTLES DRAWN AEROBIC ONLY Blood Culture adequate volume Performed at [facility], [address], [place], [place] [address]    Culture   Final    NO GROWTH 2 DAYS Performed at [facility], [address] [place], [place] [address]    Report Status PENDING  Incomplete     Radiology Studies: No results found.  Scheduled Meds:  amLODipine   10 mg Per J Tube Daily   carvedilol   12.5 mg Per Tube BID WC   Chlorhexidine  Gluconate Cloth  6 each Topical Daily   feeding supplement (PROSource TF20)  60 mL Per Tube Daily   folic acid   1 mg Intravenous Daily   furosemide   20 mg Intravenous Q12H   insulin  aspart  0-15 Units Subcutaneous Q4H   insulin  aspart  6 Units Subcutaneous Q4H   insulin  glargine-yfgn  20 Units Subcutaneous Daily   multivitamin  15 mL Per Tube Daily   nicotine   21  mg Transdermal Daily   thiamine   100 mg Per Tube Daily   Or   thiamine   100 mg Intravenous Daily   Continuous Infusions:  cefTRIAXone  (ROCEPHIN )  IV 2 g (04/28/24 0757)   feeding supplement (OSMOLITE 1.5 CAL) 55 mL/hr at 04/28/24 0400   heparin  1,350 Units/hr (04/28/24 0400)     LOS: 9 days   Fredia Skeeter, MD [facility]  04/28/2024, 8:04 AM   *Please note that this is a verbal dictation therefore any spelling or grammatical errors are due to the Dragon Medical One system interpretation.  Please page via Amion and do not message via secure chat for urgent patient care matters. Secure chat can be used for non urgent patient care matters.  How to contact the [facility] Attending or Consulting provider 7A - 7P or covering provider during after hours 7P -7A, for this patient?  Check the care team in [facility] and look for a) attending/consulting [facility] provider listed and b)  the [facility] team listed. Page or secure chat 7A-7P. Log into [email or web address] and use [place]'s universal password to access. If you do not have the password, please contact the hospital operator. Locate the [facility] provider you are looking for under [facility] and page to a number that you can be directly reached. If you still have difficulty reaching the provider, please page the [facility] (Director on Call) for the Hospitalists listed on amion for assistance.

## 2024-04-28 NOTE — TOC Progression Note (Signed)
 Transition of Care [facility]) - Progression Note    Patient Details  Name: [name] MRN: [id number] Date of Birth: 1956/04/04  Transition [facility] CM/SW Contact  Jon ONEIDA Anon, RN Phone Number: 04/28/2024, 3:41 PM  Clinical Narrative:    Pt is still requiring tube feeding at this time, not medically stable for DC. TOC is continuing to follow.   Expected Discharge Plan: Home/Self Care Barriers to Discharge: Continued Medical Work up  Expected Discharge Plan and Services In-house Referral: Clinical Social Work Discharge Planning Services: CM Consult Post Acute Care Choice: NA Living arrangements for the past 2 months: Single Family Home                 DME Arranged: N/A DME Agency: NA       HH Arranged: NA HH Agency: NA         Social Determinants of Health (SDOH) Interventions SDOH Screenings   Food Insecurity: Patient Unable To Answer (04/20/2024)  Housing: Patient Unable To Answer (04/20/2024)  Transportation Needs: Patient Unable To Answer (04/20/2024)  Utilities: Patient Unable To Answer (04/20/2024)  Alcohol Screen: Low Risk  (07/10/2023)  Depression (PHQ2-9): High Risk (04/02/2024)  Financial Resource Strain: Low Risk  (07/10/2023)  Physical Activity: Sufficiently Active (07/10/2023)  Social Connections: Patient Unable To Answer (04/20/2024)  Stress: No Stress Concern Present (07/10/2023)  Tobacco Use: High Risk (04/19/2024)  Health Literacy: Adequate Health Literacy (07/10/2023)    Readmission Risk Interventions    04/21/2024   11:05 AM  Readmission Risk Prevention Plan  Transportation Screening Complete  PCP or Specialist Appt within 3-5 Days Complete  HRI or Home Care Consult Complete  Social Work Consult for Recovery Care Planning/Counseling Complete  Palliative Care Screening Not Applicable  Medication Review Ocean[facility]) Complete

## 2024-04-29 DIAGNOSIS — K8521 Alcohol induced acute pancreatitis with uninfected necrosis: Secondary | ICD-10-CM | POA: Diagnosis not present

## 2024-04-29 LAB — GLUCOSE, CAPILLARY
Glucose-Capillary: 192 mg/dL — ABNORMAL HIGH (ref 70–99)
Glucose-Capillary: 194 mg/dL — ABNORMAL HIGH (ref 70–99)
Glucose-Capillary: 205 mg/dL — ABNORMAL HIGH (ref 70–99)
Glucose-Capillary: 218 mg/dL — ABNORMAL HIGH (ref 70–99)
Glucose-Capillary: 256 mg/dL — ABNORMAL HIGH (ref 70–99)
Glucose-Capillary: 263 mg/dL — ABNORMAL HIGH (ref 70–99)
Glucose-Capillary: 371 mg/dL — ABNORMAL HIGH (ref 70–99)

## 2024-04-29 LAB — HEPARIN LEVEL (UNFRACTIONATED)
Heparin Unfractionated: 0.23 [IU]/mL — ABNORMAL LOW (ref 0.30–0.70)
Heparin Unfractionated: 0.2 [IU]/mL — ABNORMAL LOW (ref 0.30–0.70)
Heparin Unfractionated: 0.3 [IU]/mL (ref 0.30–0.70)

## 2024-04-29 LAB — CBC
HCT: 25.4 % — ABNORMAL LOW (ref 39.0–52.0)
Hemoglobin: 8.5 g/dL — ABNORMAL LOW (ref 13.0–17.0)
MCH: 32.9 pg (ref 26.0–34.0)
MCHC: 33.5 g/dL (ref 30.0–36.0)
MCV: 98.4 fL (ref 80.0–100.0)
Platelets: 342 10*3/uL (ref 150–400)
RBC: 2.58 MIL/uL — ABNORMAL LOW (ref 4.22–5.81)
RDW: 12.3 % (ref 11.5–15.5)
WBC: 20.8 10*3/uL — ABNORMAL HIGH (ref 4.0–10.5)
nRBC: 0 % (ref 0.0–0.2)

## 2024-04-29 MED ORDER — THIAMINE MONONITRATE 100 MG PO TABS
100.0000 mg | ORAL_TABLET | Freq: Every day | ORAL | Status: DC
  Administered 2024-04-30 – 2024-05-06 (×7): 100 mg via ORAL
  Filled 2024-04-29 (×7): qty 1

## 2024-04-29 MED ORDER — INSULIN GLARGINE-YFGN 100 UNIT/ML ~~LOC~~ SOLN
30.0000 [IU] | Freq: Every day | SUBCUTANEOUS | Status: DC
  Administered 2024-04-29: 30 [IU] via SUBCUTANEOUS
  Filled 2024-04-29 (×2): qty 0.3

## 2024-04-29 MED ORDER — CARVEDILOL 12.5 MG PO TABS
12.5000 mg | ORAL_TABLET | Freq: Two times a day (BID) | ORAL | Status: DC
  Administered 2024-04-29 – 2024-05-06 (×14): 12.5 mg via ORAL
  Filled 2024-04-29 (×14): qty 1

## 2024-04-29 MED ORDER — OXYCODONE HCL 5 MG PO TABS
5.0000 mg | ORAL_TABLET | ORAL | Status: AC | PRN
  Administered 2024-04-29 (×2): 5 mg via NASOGASTRIC
  Filled 2024-04-29 (×2): qty 1

## 2024-04-29 MED ORDER — AMLODIPINE BESYLATE 10 MG PO TABS
10.0000 mg | ORAL_TABLET | Freq: Every day | ORAL | Status: DC
  Administered 2024-04-29 – 2024-05-06 (×8): 10 mg via ORAL
  Filled 2024-04-29 (×8): qty 1

## 2024-04-29 MED ORDER — BOOST / RESOURCE BREEZE PO LIQD CUSTOM
1.0000 | Freq: Three times a day (TID) | ORAL | Status: DC
  Administered 2024-04-29 – 2024-05-03 (×6): 1 via ORAL

## 2024-04-29 MED ORDER — THIAMINE HCL 100 MG/ML IJ SOLN
100.0000 mg | Freq: Every day | INTRAMUSCULAR | Status: DC
  Administered 2024-04-29: 100 mg via INTRAVENOUS
  Filled 2024-04-29: qty 2

## 2024-04-29 NOTE — Plan of Care (Signed)

## 2024-04-29 NOTE — Progress Notes (Signed)
 PHARMACY - ANTICOAGULATION CONSULT NOTE  Pharmacy Consult for heparin   Indication: SMV thrombus  Allergies  Allergen Reactions   Lisinopril  Swelling and Other (See Comments)    angioedema   Citalopram Nausea Only   Morphine  Nausea And Vomiting   Nsaids Other (See Comments)    Stomach Irritability    Paroxetine Nausea And Vomiting    Patient Measurements: Height: 5' 7 (170.2 cm) Weight: 68.1 kg (150 lb 2.1 oz) IBW/kg (Calculated) : 66.1 HEPARIN  DW (KG): 65.6  Vital Signs: Temp: 98.7 F (37.1 C) (07/03 1200) Temp Source: Oral (07/03 1200) BP: 126/53 (07/03 1300)  Labs: Recent Labs    04/27/24 0008 04/27/24 1006 04/28/24 0102 04/29/24 0534 04/29/24 0536 04/29/24 1420  HGB 9.2*  --  8.7* 8.5*  --   --   HCT 27.5*  --  25.5* 25.4*  --   --   PLT 223  --  278 342  --   --   HEPARINUNFRC 0.29*   < > 0.35  --  0.23* 0.20*  CREATININE 0.72  --  0.81  --   --   --    < > = values in this interval not displayed.    Estimated Creatinine Clearance: 82.7 mL/min (by C-G formula based on SCr of 0.81 mg/dL).   Medical History: Past Medical History:  Diagnosis Date   Alcohol abuse    Allergic rhinitis    CHF (congestive heart failure) (HCC)    Diabetes mellitus without complication (HCC)    'sometime last yr'--type 2   GERD (gastroesophageal reflux disease)    Hepatitis    he thinks its hep b or c   Hyperlipidemia    Hypertension    Osteoarthritis    Pancreatitis    Rheumatoid arthritis(714.0)    Stroke (HCC)    Tobacco user     Assessment: Patient is a 68 y.o M with hx EtOH abuse and pancreatitis who presented to the ED on 04/19/24 with c/o abdominal pain.  Abdominal CT on 04/19/24 showed findings with concern for necrotizing pancreatitis and thrombus extending from the SMV into the porta splenic confluence and proximal main portal vein.    Pt was started on heparin  drip for anticoagulation on admission. Heparin  was stopped in the afternoon on 6/28 for  hemoptysis. Hemoptysis has resolved, pharmacy consulted to restart heparin  drip.   Today, 04/29/2024: Heparin  level decreased to 0.20, subtherapeutic, on IV heparin  1450 units/hr CBC: Hgb decreased to 8.5, Pltc WNL No bleeding, infusion related concerns, or pauses reported by RN  Goal of Therapy:  Heparin  level 0.3-0.7 units/ml Monitor platelets by anticoagulation protocol: Yes   Plan:  No heparin  bolus d/t recent hemoptysis concerns Increase to heparin  IV infusion at 1600 units/hr Obtain heparin  level 6 hours after rate change Daily heparin  level and CBC Monitor for signs of bleeding, hemoptysis    Dolphus Roller, PharmD, BCPS 04/29/2024 3:52 PM

## 2024-04-29 NOTE — Progress Notes (Signed)
 PROGRESS NOTE    [name]  FMW:[id number] DOB: [birth year] DOA: 04/19/2024 PCP: [name], NP   Brief Narrative:  68 year old male with history of DM2, HTN, HLD, rheumatoid arthritis, prior CVA, tobacco and alcohol use comes into the hospital and is admitted on 6/23 with abdominal pain, found to have necrotizing pancreatitis. Hospital course complicated by persistent abdominal pain requiring NG tube placement for feeding, also hypoxic respiratory failure requiring high flow oxygen. He was also found to have a thrombus extending from the SMV and the portal splenic confluence. Has been placed on anticoagulation. GI consulted early on but they signed off. Surgery also consulted due to necrotizing pancreatitis   Assessment & Plan:   Principal Problem:   Pancreatitis, alcoholic, acute Active Problems:   Malnutrition of moderate degree   Hemoptysis   Acute hypoxemic respiratory failure (HCC)  Acute necrotizing pancreatitis, EtOH induced -imaging on admission showed acute pancreatitis with findings suggestive of necrotizing pancreatitis.  With no abscess or pseudocyst were seen.  GI consulted, now signed off.  General surgery also consulted, no indication for surgery and they signed off as well.  Continue multimodal pain management and supportive care.  Tolerating full liquid diet.  Will advance to soft diet.  Continue tube feeds.  Wean when patient able to have adequate oral nutrition.   Acute hypoxic respiratory failure and sepsis secondary to aspiration pneumonia, sepsis not present on admission: - Chest x-ray does not show any significant ARDS, however CT chest completed on 04/25/2024 shows extensive consolidation in the lung bases.  Patient's leukocytosis is worsening at 24 today and he has developed fever again with latest temperature of 102 at 11:30 PM on 04/27/2024.  Has been restarted on Rocephin  since 04/26/2024.  Currently on 2 L of high flow oxygen, will wean as able to.   Encouraged incentive spirometry.   SMV portal splenic thrombus/hemoptysis- This is likely due to pancreatitis.  Thrombus is nonocclusive.  Heparin  was held due to hemoptysis on early morning of 04/27/2024 but resumed later on the same day.  Monitor closely.  Likely transition to DOAC tomorrow.   Essential hypertension-pretty hypertensive on admission, has been placed on amlodipine , Coreg .  Continue that, blood pressure improving.   EtOH use-no withdrawals noted, status post Librium taper, out of the window for withdrawal.   Hypokalemia-replaced/resolved.  Mild acute hyponatremia: 128 today.  Monitor for now.   Chronic diastolic CHF-now being diuresed with IV furosemide .   RA-does not appear to be on any medications at home   Anemia of critical illness-hemoglobin with slight drop to 8.5 today.  No indication of transfusion.  Monitor closely and transfuse if less than 7.   Thrombocytopenia-in the setting of EtOH use however this has resolved.   DM2, with hyperglycemia-started on 20 units of Lantus  yesterday, still hyperglycemic, will increase this to 30 units and continue SSI.   DVT prophylaxis: Heparin    Code Status: Full Code  Family Communication:  None present at bedside.  Plan of care discussed with patient in length and he/she verbalized understanding and agreed with it.  Status is: Inpatient Remains inpatient appropriate because: Still sick, needs management of multiple medical problems as mentioned above.   Estimated body mass index is 18.68 kg/m as calculated from the following:   Height as of this encounter: 5' 7 (1.702 m).   Weight as of this encounter: 54.1 kg.    Nutritional Assessment: Body mass index is 18.68 kg/m.[name] Seen by dietician.  I agree with the assessment  and plan as outlined below: Nutrition Status: Nutrition Problem: Moderate Malnutrition Etiology: chronic illness (alcohol abuse; CHF) Signs/Symptoms: mild fat depletion, moderate muscle  depletion Interventions: Refer to RD note for recommendations, Tube feeding  . Skin Assessment: I have examined the patient's skin and I agree with the wound assessment as performed by the wound care RN as outlined below:    Consultants:  Critical care  Procedures:  None  Antimicrobials:  Anti-infectives (From admission, onward)    Start     Dose/Rate Route Frequency Ordered Stop   04/26/24 0845  cefTRIAXone  (ROCEPHIN ) 2 g in sodium chloride  0.9 % 100 mL IVPB        2 g 200 mL/hr over 30 Minutes Intravenous Every 24 hours 04/26/24 0750     04/20/24 1800  meropenem  (MERREM ) 1 g in sodium chloride  0.9 % 100 mL IVPB  Status:  Discontinued        1 g 200 mL/hr over 30 Minutes Intravenous Every 12 hours 04/19/24 1725 04/19/24 1751   04/19/24 1715  meropenem  (MERREM ) 1 g in sodium chloride  0.9 % 100 mL IVPB  Status:  Discontinued        1 g 200 mL/hr over 30 Minutes Intravenous Every 8 hours 04/19/24 1705 04/19/24 1725   04/19/24 1415  cefTRIAXone  (ROCEPHIN ) 2 g in sodium chloride  0.9 % 100 mL IVPB        2 g 200 mL/hr over 30 Minutes Intravenous Once 04/19/24 1404 04/19/24 1458   04/19/24 1415  metroNIDAZOLE  (FLAGYL ) IVPB 500 mg        500 mg 100 mL/hr over 60 Minutes Intravenous  Once 04/19/24 1404 04/19/24 1521   04/19/24 1345  vancomycin  (VANCOREADY) IVPB 1500 mg/300 mL  Status:  Discontinued        1,500 mg 150 mL/hr over 120 Minutes Intravenous  Once 04/19/24 1333 04/19/24 1403   04/19/24 1330  ceFEPIme  (MAXIPIME ) 2 g in sodium chloride  0.9 % 100 mL IVPB  Status:  Discontinued        2 g 200 mL/hr over 30 Minutes Intravenous  Once 04/19/24 1326 04/19/24 1403   04/19/24 1330  metroNIDAZOLE  (FLAGYL ) IVPB 500 mg  Status:  Discontinued        500 mg 100 mL/hr over 60 Minutes Intravenous  Once 04/19/24 1326 04/19/24 1403   04/19/24 1330  vancomycin  (VANCOCIN ) IVPB 1000 mg/200 mL premix  Status:  Discontinued        1,000 mg 200 mL/hr over 60 Minutes Intravenous  Once 04/19/24  1326 04/19/24 1332         Subjective: Patient seen and examined.  No new complaint.  Mild abdominal pain.  No nausea or shortness of breath.  Objective: Vitals:   04/29/24 0400 04/29/24 0500 04/29/24 0600 04/29/24 0700  BP: (!) 123/47 121/64 (!) 115/41 124/62  Pulse:      Resp: 19 (!) 21 18 (!) 22  Temp:      TempSrc:      SpO2:      Weight:      Height:        Intake/Output Summary (Last 24 hours) at 04/29/2024 0744 Last data filed at 04/28/2024 1800 Gross per 24 hour  Intake 1324.52 ml  Output 1100 ml  Net 224.52 ml   Filed Weights   04/25/24 0500 04/26/24 0429 04/28/24 0500  Weight: 65.3 kg 54.1 kg 54.1 kg    Examination:  General exam: Appears calm and comfortable but appears chronically sick Respiratory system: Coarse  breath sounds and rhonchi but improved compared to yesterday, no wheezes or crackles.[name] Respiratory effort normal. Cardiovascular system: S1 & S2 heard, RRR. No JVD, murmurs, rubs, gallops or clicks. No pedal edema. Gastrointestinal system: Abdomen is nondistended, soft and lysed abdominal tenderness. No organomegaly or masses felt. Normal bowel sounds heard. Central nervous system: Alert and oriented. No focal neurological deficits. Extremities: Symmetric 5 x 5 power. Skin: No rashes, lesions or ulcers.  Psychiatry: Judgement and insight appear normal. Mood & affect appropriate.   Data Reviewed: I have personally reviewed following labs and imaging studies  CBC: Recent Labs  Lab 04/24/24 0024 04/24/24 0524 04/25/24 0513 04/26/24 0340 04/27/24 0008 04/28/24 0102 04/29/24 0534  WBC 12.3*   < > 15.5* 18.7* 21.2* 24.8* 20.8*  NEUTROABS 7.9*  --   --   --   --   --   --   HGB 11.0*   < > 10.6* 10.3* 9.2* 8.7* 8.5*  HCT 33.8*   < > 33.1* 32.0* 27.5* 25.5* 25.4*  MCV 102.1*   < > 104.7* 101.6* 100.7* 98.5 98.4  PLT 96*   < > 139* 183 223 278 342   < > = values in this interval not displayed.   Basic Metabolic Panel: Recent Labs  Lab  04/23/24 1647 04/24/24 0524 04/24/24 1712 04/25/24 0513 04/26/24 0340 04/27/24 0008 04/28/24 0102  NA  --  141  --  139 131* 130* 128*  K  --  3.4*  --  3.9 3.8 3.5 3.8  CL  --  108  --  105 96* 92* 90*  CO2  --  24  --  24 24 28 27   GLUCOSE  --  198*  --  302* 312* 155* 154*  BUN  --  15  --  17 16 17 17   CREATININE  --  0.71  --  0.78 0.80 0.72 0.81  CALCIUM   --  8.7*  --  8.8* 8.7* 8.5* 8.3*  MG 2.1 2.1 2.0 1.9 1.8  --   --   PHOS 2.5 3.0 2.8 3.7 3.4  --   --    GFR: Estimated Creatinine Clearance: 67.7 mL/min (by C-G formula based on SCr of 0.81 mg/dL). Liver Function Tests: Recent Labs  Lab 04/24/24 0524 04/25/24 0513 04/26/24 0340 04/27/24 0008 04/28/24 0102  AST 51* 47* 77* 51* 47*  ALT 32 39 51* 45* 40  ALKPHOS 87 93 112 101 99  BILITOT 1.0 1.3* 1.0 0.7 0.9  PROT 6.1* 6.4* 6.5 6.3* 6.5  ALBUMIN  2.4* 2.5* 2.5* 2.3* 2.2*   Recent Labs  Lab 04/26/24 0340  LIPASE 99*   No results for input(s): AMMONIA in the last 168 hours. Coagulation Profile: No results for input(s): INR, PROTIME in the last 168 hours. Cardiac Enzymes: No results for input(s): CKTOTAL, CKMB, CKMBINDEX, TROPONINI in the last 168 hours. BNP (last 3 results) No results for input(s): PROBNP in the last 8760 hours. HbA1C: No results for input(s): HGBA1C in the last 72 hours. CBG: Recent Labs  Lab 04/28/24 1554 04/28/24 1922 04/28/24 2349 04/29/24 0317 04/29/24 0721  GLUCAP 199* 252* 194* 192* 218*   Lipid Profile: No results for input(s): CHOL, HDL, LDLCALC, TRIG, CHOLHDL, LDLDIRECT in the last 72 hours. Thyroid Function Tests: No results for input(s): TSH, T4TOTAL, FREET4, T3FREE, THYROIDAB in the last 72 hours. Anemia Panel: No results for input(s): VITAMINB12, FOLATE, FERRITIN, TIBC, IRON, RETICCTPCT in the last 72 hours. Sepsis Labs: No results for input(s): PROCALCITON, LATICACIDVEN in  the last 168 hours.  Recent Results  (from the past 240 hours)  Blood Culture (routine x 2)     Status: None   Collection Time: 04/19/24  1:45 PM   Specimen: BLOOD LEFT ARM  Result Value Ref Range Status   Specimen Description   Final    BLOOD LEFT ARM Performed at [facility], [address], [place], [place] [address]    Special Requests   Final    BOTTLES DRAWN AEROBIC AND ANAEROBIC Blood Culture adequate volume Performed at [facility], [address], [place], [place] [address]    Culture   Final    NO GROWTH 5 DAYS Performed at [facility], [address] [place], [place] [address]    Report Status 04/24/2024 FINAL  Final  Resp panel by RT-PCR (RSV, Flu A&B, Covid) Anterior Nasal Swab     Status: None   Collection Time: 04/19/24  2:49 PM   Specimen: Anterior Nasal Swab  Result Value Ref Range Status   SARS Coronavirus 2 by RT PCR NEGATIVE NEGATIVE Final    Comment: (NOTE) SARS-CoV-2 target nucleic acids are NOT DETECTED.  The SARS-CoV-2 RNA is generally detectable in upper respiratory specimens during the acute phase of infection. The lowest concentration of SARS-CoV-2 viral copies this assay can detect is 138 copies/mL. A negative result does not preclude SARS-Cov-2 infection and should not be used as the sole basis for treatment or other patient management decisions. A negative result may occur with  improper specimen collection/handling, submission of specimen other than nasopharyngeal swab, presence of viral mutation(s) within the areas targeted by this assay, and inadequate number of viral copies(<138 copies/mL). A negative result must be combined with clinical observations, patient history, and epidemiological information. The expected result is Negative.  Fact Sheet for Patients:  BloggerCourse.com  Fact Sheet for Healthcare Providers:  SeriousBroker.it  This test is no t yet approved or cleared by the [place] FDA and  has been authorized for detection and/or diagnosis of SARS-CoV-2 by FDA under an Emergency Use Authorization (EUA). This EUA will remain  in effect (meaning this test can be used) for the duration of the COVID-19 declaration under Section 564(b)(1) of the Act, 21 U.S.C.section 360bbb-3(b)(1), unless the authorization is terminated  or revoked sooner.       Influenza A by PCR NEGATIVE NEGATIVE Final   Influenza B by PCR NEGATIVE NEGATIVE Final    Comment: (NOTE) The Xpert Xpress SARS-CoV-2/FLU/RSV plus assay is intended as an aid in the diagnosis of influenza from Nasopharyngeal swab specimens and should not be used as a sole basis for treatment. Nasal washings and aspirates are unacceptable for Xpert Xpress SARS-CoV-2/FLU/RSV testing.  Fact Sheet for Patients: BloggerCourse.com  Fact Sheet for Healthcare Providers: SeriousBroker.it  This test is not yet approved or cleared by the [place]  FDA and has been authorized for detection and/or diagnosis of SARS-CoV-2 by FDA under an Emergency Use Authorization (EUA). This EUA will remain in effect (meaning this test can be used) for the duration of the COVID-19 declaration under Section 564(b)(1) of the Act, 21 U.S.C. section 360bbb-3(b)(1), unless the authorization is terminated or revoked.     Resp Syncytial Virus by PCR NEGATIVE NEGATIVE Final    Comment: (NOTE) Fact Sheet for Patients: BloggerCourse.com  Fact Sheet for Healthcare Providers: SeriousBroker.it  This test is not yet approved or cleared by the [place]  FDA and has been authorized for detection and/or diagnosis of  SARS-CoV-2 by FDA under an Emergency Use Authorization (EUA). This EUA will remain in effect (meaning this test can be used) for the duration of the COVID-19 declaration under Section 564(b)(1) of the Act, 21 U.S.C. section  360bbb-3(b)(1), unless the authorization is terminated or revoked.  Performed at [facility], [address] [place], [place] [address]   Urine Culture     Status: Abnormal   Collection Time: 04/19/24  3:55 PM   Specimen: Urine, Random  Result Value Ref Range Status   Specimen Description   Final    URINE, RANDOM Performed at [facility], [address], [place], [place] [address]    Special Requests   Final    NONE Reflexed from [phone] Performed at [facility], [address], [place], [place] [address]    Culture (A)  Final    <10,000 COLONIES/mL INSIGNIFICANT GROWTH Performed at [facility], [address], [place], [place] [address]    Report Status 04/20/2024 FINAL  Final  MRSA Next Gen by PCR, Nasal     Status: None   Collection Time: 04/19/24  5:47 PM   Specimen: Nasal Mucosa; Nasal Swab  Result Value Ref Range Status   MRSA by PCR Next Gen NOT DETECTED NOT DETECTED Final    Comment: (NOTE) The GeneXpert MRSA Assay (FDA approved for NASAL specimens only), is one component of a comprehensive MRSA colonization surveillance program. It is not intended to diagnose MRSA infection nor to guide or monitor treatment for MRSA infections. Test performance is not FDA approved in patients less than 36 years old. Performed at [facility], [address] [place], [place] [address]   Culture, blood (Routine X 2) w Reflex to ID Panel     Status: None (Preliminary result)   Collection Time: 04/26/24  5:41 AM   Specimen: BLOOD  Result Value Ref Range Status   Specimen Description   Final    BLOOD BLOOD RIGHT ARM Performed at [facility], [address], [place], [place] [address]    Special Requests   Final    BOTTLES DRAWN AEROBIC ONLY Blood Culture adequate volume Performed at [facility], [address], [place], [place] [address]    Culture   Final     NO GROWTH 3 DAYS Performed at [facility], [address], [place], [place] [address]    Report Status PENDING  Incomplete  Culture, blood (Routine X 2) w Reflex to ID Panel     Status: None (Preliminary result)   Collection Time: 04/26/24  5:41 AM   Specimen: BLOOD  Result Value Ref Range Status   Specimen Description   Final    BLOOD BLOOD RIGHT ARM Performed at [facility], [address], [place], [place] [address]    Special Requests   Final    BOTTLES DRAWN AEROBIC ONLY Blood Culture adequate volume Performed at [facility], [address] [place], [place] [address]    Culture   Final    NO GROWTH 3 DAYS Performed at [facility], [address] [place], [place] [address]    Report Status PENDING  Incomplete     Radiology Studies: No results found.  Scheduled Meds:  amLODipine   10 mg Per J Tube Daily   carvedilol   12.5 mg Per Tube BID WC   Chlorhexidine  Gluconate Cloth  6 each Topical Daily   feeding  supplement (PROSource TF20)  60 mL Per Tube Daily   folic acid   1 mg Intravenous Daily   furosemide   20 mg Intravenous Q12H   insulin  aspart  0-15 Units Subcutaneous Q4H   insulin  aspart  6 Units Subcutaneous Q4H   insulin  glargine-yfgn  30 Units Subcutaneous Daily   multivitamin  15 mL Per Tube Daily   nicotine   21 mg Transdermal Daily   thiamine   100 mg Per Tube Daily   Or   thiamine   100 mg Intravenous Daily   Continuous Infusions:  cefTRIAXone  (ROCEPHIN )  IV Stopped (04/28/24 0828)   feeding supplement (OSMOLITE 1.5 CAL) 1,000 mL (04/28/24 1609)   heparin  1,350 Units/hr (04/28/24 1603)     LOS: 10 days   Fredia Skeeter, MD [facility]  04/29/2024, 7:44 AM   *Please note that this is a verbal dictation therefore any spelling or grammatical errors are due to the Dragon Medical One system interpretation.  Please page via Amion and do not message via secure chat for urgent patient care matters.  Secure chat can be used for non urgent patient care matters.  How to contact the [facility] Attending or Consulting provider 7A - 7P or covering provider during after hours 7P -7A, for this patient?  Check the care team in [facility] and look for a) attending/consulting [facility] provider listed and b) the [facility] team listed. Page or secure chat 7A-7P. Log into [email or web address] and use [place]'s universal password to access. If you do not have the password, please contact the hospital operator. Locate the [facility] provider you are looking for under [facility] and page to a number that you can be directly reached. If you still have difficulty reaching the provider, please page the [facility] (Director on Call) for the Hospitalists listed on amion for assistance.

## 2024-04-29 NOTE — Progress Notes (Signed)
 Nutrition Follow-up  DOCUMENTATION CODES:   Non-severe (moderate) malnutrition in context of chronic illness  INTERVENTION:  -Continue Osmolite 1.5 at 55 ml/h (1320 ml per day) Prosource TF20 60 ml daily Provides 2060 kcal, 102 gm protein, 1006 ml free water daily   - Continue 1mg  folic acid , 100mg  thiamine , and Multivitamin with minerals daily.    - DYS 3 diet per MD.  - Add Boost Breeze po TID, each supplement provides 250 kcal and 9 grams of protein - Encourage intake and monitor oral intake.    - Recommend continuing tube feeds until patient consistently meeting >50% of estimated needs with oral intake.   NUTRITION DIAGNOSIS:   Moderate Malnutrition related to chronic illness (alcohol abuse; CHF) as evidenced by mild fat depletion, moderate muscle depletion. *ongoing  GOAL:   Patient will meet greater than or equal to 90% of their needs *met with TF  MONITOR:   Diet advancement, Labs, Weight trends, TF tolerance  REASON FOR ASSESSMENT:   Consult Poor PO  ASSESSMENT:   68 y.o. male with PMH alcohol abuse, CHF, DM2, pancreatitis, and RA who presented with abdominal pain and admitted for severe acute necrotizing alcoholic pancreatitis.  6/23: admitted 6/25: post-pyloric Cortrak placed; TF's initiated  6/30: CLD 7/2: FLD 7/3: DYS 3 diet  Patient sleeping at time of visit but awoke to sound of voice. Reports tolerating tube feeds well. TF's running at goal of 76mL/hr at time of visit.   Diet has been slowing advancing over the past few days. However, patient reports he has not had anything and today will be the first time he is trying. Added Boost Breeze this AM and patient reports trying it and liking it. Encouraged intake of supplements and to slowly increase oral intake as tolerated.  Would recommend continuing tube feeds until patient consistently meeting >50% of estimated needs with oral intake.    Admit weight: 147# Current weight: 119# (was 143# on  6/29) I&O's: +6.6L since admit  Medications reviewed and include: 1mg  folic acid , MVI, 100mg  thiamine , Lasix   Labs reviewed:  No BMP today HA1C 6.1 Blood Glucose 192-303 x24 hours   Diet Order:   Diet Order             DIET DYS 3 Room service appropriate? Yes; Fluid consistency: Thin  Diet effective now                   EDUCATION NEEDS:  Not appropriate for education at this time  Skin:  Skin Assessment: Reviewed RN Assessment  Last BM:  7/3 - type 6  Height:  Ht Readings from Last 1 Encounters:  04/23/24 5' 7 (1.702 m)   Weight:  Wt Readings from Last 1 Encounters:  04/28/24 54.1 kg    BMI:  Body mass index is 18.68 kg/m.  Estimated Nutritional Needs:  Kcal:  1900-2050 kcals Protein:  90-100 grams Fluid:  >/= 1.9L    Trude Ned RD, LDN Contact via Secure Chat.

## 2024-04-29 NOTE — Progress Notes (Signed)
 PHARMACY - ANTICOAGULATION CONSULT NOTE  Pharmacy Consult for heparin   Indication: SMV thrombus  Allergies  Allergen Reactions   Lisinopril  Swelling and Other (See Comments)    angioedema   Citalopram Nausea Only   Morphine  Nausea And Vomiting   Nsaids Other (See Comments)    Stomach Irritability    Paroxetine Nausea And Vomiting    Patient Measurements: Height: 5' 7 (170.2 cm) Weight: 68.1 kg (150 lb 2.1 oz) IBW/kg (Calculated) : 66.1 HEPARIN  DW (KG): 65.6  Vital Signs: Temp: 98.7 F (37.1 C) (07/03 1958) Temp Source: Oral (07/03 1958) BP: 138/53 (07/03 2200) Pulse Rate: 83 (07/03 2100)  Labs: Recent Labs    04/27/24 0008 04/27/24 1006 04/28/24 0102 04/29/24 0534 04/29/24 0536 04/29/24 1420 04/29/24 2207  HGB 9.2*  --  8.7* 8.5*  --   --   --   HCT 27.5*  --  25.5* 25.4*  --   --   --   PLT 223  --  278 342  --   --   --   HEPARINUNFRC 0.29*   < > 0.35  --  0.23* 0.20* 0.30  CREATININE 0.72  --  0.81  --   --   --   --    < > = values in this interval not displayed.    Estimated Creatinine Clearance: 82.7 mL/min (by C-G formula based on SCr of 0.81 mg/dL).   Medical History: Past Medical History:  Diagnosis Date   Alcohol abuse    Allergic rhinitis    CHF (congestive heart failure) (HCC)    Diabetes mellitus without complication (HCC)    'sometime last yr'--type 2   GERD (gastroesophageal reflux disease)    Hepatitis    he thinks its hep b or c   Hyperlipidemia    Hypertension    Osteoarthritis    Pancreatitis    Rheumatoid arthritis(714.0)    Stroke (HCC)    Tobacco user     Assessment: Patient is a 68 y.o M with hx EtOH abuse and pancreatitis who presented to the ED on 04/19/24 with c/o abdominal pain.  Abdominal CT on 04/19/24 showed findings with concern for necrotizing pancreatitis and thrombus extending from the SMV into the porta splenic confluence and proximal main portal vein.    Pt was started on heparin  drip for anticoagulation on  admission. Heparin  was stopped in the afternoon on 6/28 for hemoptysis. Hemoptysis has resolved, pharmacy consulted to restart heparin  drip.   Today, 04/29/2024: Heparin  level 0.3, (therapeutic - low end), on IV heparin  1600 units/hr CBC: Hgb decreased to 8.5, Pltc WNL No bleeding or nfusion related concerns reported   Goal of Therapy:  Heparin  level 0.3-0.7 units/ml Monitor platelets by anticoagulation protocol: Yes   Plan:  No heparin  bolus d/t recent hemoptysis concerns Increase slightly to heparin  IV infusion at 1650 units/hr to maintain therapeutic level Obtain heparin  level in 6 hours to confirm therapeutic dose Daily heparin  level and CBC Monitor for signs of bleeding, hemoptysis    Arvin Gauss, PharmD 04/29/2024 11:46 PM

## 2024-04-29 NOTE — Progress Notes (Signed)
 PHARMACY - ANTICOAGULATION CONSULT NOTE  Pharmacy Consult for heparin   Indication: SMV thrombus  Allergies  Allergen Reactions   Lisinopril  Swelling and Other (See Comments)    angioedema   Citalopram Nausea Only   Morphine  Nausea And Vomiting   Nsaids Other (See Comments)    Stomach Irritability    Paroxetine Nausea And Vomiting    Patient Measurements: Height: 5' 7 (170.2 cm) Weight: 54.1 kg (119 lb 4.3 oz) IBW/kg (Calculated) : 66.1 HEPARIN  DW (KG): 65.6  Vital Signs: Temp: 99.2 F (37.3 C) (07/03 0317) Temp Source: Oral (07/03 0317) BP: 115/41 (07/03 0600)  Labs: Recent Labs    04/27/24 0008 04/27/24 1006 04/28/24 0102 04/29/24 0534 04/29/24 0536  HGB 9.2*  --  8.7* 8.5*  --   HCT 27.5*  --  25.5* 25.4*  --   PLT 223  --  278 342  --   HEPARINUNFRC 0.29* 0.39 0.35  --  0.23*  CREATININE 0.72  --  0.81  --   --     Estimated Creatinine Clearance: 67.7 mL/min (by C-G formula based on SCr of 0.81 mg/dL).   Medical History: Past Medical History:  Diagnosis Date   Alcohol abuse    Allergic rhinitis    CHF (congestive heart failure) (HCC)    Diabetes mellitus without complication (HCC)    'sometime last yr'--type 2   GERD (gastroesophageal reflux disease)    Hepatitis    he thinks its hep b or c   Hyperlipidemia    Hypertension    Osteoarthritis    Pancreatitis    Rheumatoid arthritis(714.0)    Stroke (HCC)    Tobacco user     Assessment: Patient is a 68 y.o M with hx EtOH abuse and pancreatitis who presented to the ED on 04/19/24 with c/o abdominal pain.  Abdominal CT on 04/19/24 showed findings with concern for necrotizing pancreatitis and thrombus extending from the SMV into the porta splenic confluence and proximal main portal vein.    Pt was started on heparin  drip for anticoagulation on admission. Heparin  was stopped in the afternoon on 6/28 for hemoptysis. Hemoptysis has resolved, pharmacy consulted to restart heparin  drip.   Today,  04/29/2024: Heparin  level decreased to 0.23, subtherapeutic, on IV heparin  1350 units/hr CBC: Hgb decreased to 8.5, Pltc WNL No bleeding, infusion related concerns, or pauses reported by RN  Goal of Therapy:  Heparin  level 0.3-0.7 units/ml Monitor platelets by anticoagulation protocol: Yes   Plan:  No heparin  bolus d/t recent hemoptysis concerns Increase to heparin  IV infusion at 1450 units/hr Heparin  level 6 hours after rate change Daily heparin  level and CBC Monitor for signs of bleeding, hemoptysis   Wanda Hasting PharmD, BCPS [facility] main pharmacy [phone] 04/29/2024 7:07 AM

## 2024-04-29 NOTE — Plan of Care (Signed)
  Problem: Education: Goal: Knowledge of General Education information will improve Description: Including pain rating scale, medication(s)/side effects and non-pharmacologic comfort measures Outcome: Progressing   Problem: Clinical Measurements: Goal: Will remain free from infection Outcome: Progressing Goal: Cardiovascular complication will be avoided Outcome: Progressing   Problem: Activity: Goal: Risk for activity intolerance will decrease Outcome: Progressing   Problem: Pain Managment: Goal: General experience of comfort will improve and/or be controlled Outcome: Progressing   Problem: Safety: Goal: Ability to remain free from injury will improve Outcome: Progressing   Problem: Skin Integrity: Goal: Risk for impaired skin integrity will decrease Outcome: Progressing

## 2024-04-30 DIAGNOSIS — K8521 Alcohol induced acute pancreatitis with uninfected necrosis: Secondary | ICD-10-CM | POA: Diagnosis not present

## 2024-04-30 LAB — COMPREHENSIVE METABOLIC PANEL WITH GFR
ALT: 43 U/L (ref 0–44)
AST: 46 U/L — ABNORMAL HIGH (ref 15–41)
Albumin: 2.3 g/dL — ABNORMAL LOW (ref 3.5–5.0)
Alkaline Phosphatase: 109 U/L (ref 38–126)
Anion gap: 14 (ref 5–15)
BUN: 16 mg/dL (ref 8–23)
CO2: 27 mmol/L (ref 22–32)
Calcium: 9 mg/dL (ref 8.9–10.3)
Chloride: 89 mmol/L — ABNORMAL LOW (ref 98–111)
Creatinine, Ser: 0.85 mg/dL (ref 0.61–1.24)
GFR, Estimated: >60 mL/min (ref >60)
Glucose, Bld: 168 mg/dL — ABNORMAL HIGH (ref 70–99)
Potassium: 3.6 mmol/L (ref 3.5–5.1)
Sodium: 130 mmol/L — ABNORMAL LOW (ref 135–145)
Total Bilirubin: 0.7 mg/dL (ref 0.0–1.2)
Total Protein: 7.1 g/dL (ref 6.5–8.1)

## 2024-04-30 LAB — HEPARIN LEVEL (UNFRACTIONATED): Heparin Unfractionated: 0.36 [IU]/mL (ref 0.30–0.70)

## 2024-04-30 LAB — GLUCOSE, CAPILLARY
Glucose-Capillary: 218 mg/dL — ABNORMAL HIGH (ref 70–99)
Glucose-Capillary: 183 mg/dL — ABNORMAL HIGH (ref 70–99)
Glucose-Capillary: 195 mg/dL — ABNORMAL HIGH (ref 70–99)
Glucose-Capillary: 330 mg/dL — ABNORMAL HIGH (ref 70–99)
Glucose-Capillary: 287 mg/dL — ABNORMAL HIGH (ref 70–99)

## 2024-04-30 LAB — CBC
HCT: 26.4 % — ABNORMAL LOW (ref 39.0–52.0)
Hemoglobin: 9 g/dL — ABNORMAL LOW (ref 13.0–17.0)
MCH: 33.6 pg (ref 26.0–34.0)
MCHC: 34.1 g/dL (ref 30.0–36.0)
MCV: 98.5 fL (ref 80.0–100.0)
Platelets: 446 K/uL — ABNORMAL HIGH (ref 150–400)
RBC: 2.68 MIL/uL — ABNORMAL LOW (ref 4.22–5.81)
RDW: 12.3 % (ref 11.5–15.5)
WBC: 24.5 K/uL — ABNORMAL HIGH (ref 4.0–10.5)
nRBC: 0.1 % (ref 0.0–0.2)

## 2024-04-30 MED ORDER — HYDROMORPHONE HCL 1 MG/ML IJ SOLN
0.5000 mg | INTRAMUSCULAR | Status: DC | PRN
  Administered 2024-04-30 – 2024-05-05 (×26): 0.5 mg via INTRAVENOUS
  Filled 2024-04-30 (×4): qty 0.5
  Filled 2024-04-30 (×2): qty 1
  Filled 2024-04-30: qty 0.5
  Filled 2024-04-30: qty 1
  Filled 2024-04-30: qty 0.5
  Filled 2024-04-30 (×3): qty 1
  Filled 2024-04-30 (×3): qty 0.5
  Filled 2024-04-30: qty 1
  Filled 2024-04-30: qty 0.5
  Filled 2024-04-30 (×3): qty 1
  Filled 2024-04-30 (×4): qty 0.5
  Filled 2024-04-30: qty 1
  Filled 2024-04-30: qty 0.5
  Filled 2024-04-30: qty 1

## 2024-04-30 MED ORDER — APIXABAN 5 MG PO TABS
5.0000 mg | ORAL_TABLET | Freq: Two times a day (BID) | ORAL | Status: DC
  Administered 2024-04-30 – 2024-05-06 (×13): 5 mg via ORAL
  Filled 2024-04-30 (×13): qty 1

## 2024-04-30 MED ORDER — OXYCODONE HCL 5 MG PO TABS
5.0000 mg | ORAL_TABLET | ORAL | Status: DC | PRN
  Administered 2024-04-30 – 2024-05-05 (×11): 5 mg via ORAL
  Filled 2024-04-30 (×11): qty 1

## 2024-04-30 MED ORDER — PROSOURCE PLUS PO LIQD
30.0000 mL | Freq: Two times a day (BID) | ORAL | Status: DC
  Administered 2024-04-30 – 2024-05-03 (×3): 30 mL via ORAL
  Filled 2024-04-30 (×5): qty 30

## 2024-04-30 MED ORDER — INSULIN GLARGINE-YFGN 100 UNIT/ML ~~LOC~~ SOLN
35.0000 [IU] | Freq: Every day | SUBCUTANEOUS | Status: DC
  Administered 2024-04-30: 35 [IU] via SUBCUTANEOUS
  Filled 2024-04-30 (×2): qty 0.35

## 2024-04-30 NOTE — Progress Notes (Signed)
 Brief Nutrition Note  Plan to start a calorie count to assess PO intake and determine if patient eating enough to come off TF's. Per RN, patient ate 50% of breakfast today.   Interventions: - 48 hour calorie count to start 7/4 at and run through 7/6.  - Please document % intake of all foods, drinks, and nutrition supplements patient consumes on meal tickets and place in envelope on patient's door.  - If patient skips/refuses meals please document 0% for that meal.  - Discussed with RN.   - DYS 3 diet per MD.  - Boost Breeze po TID, each supplement provides 250 kcal and 9 grams of protein. - ProSource Plus BID, provides 100 kcals and 15g protein. - Encourage intake and monitor oral intake.   Continue Osmolite 1.5 at 55 ml/h (1320 ml per day) Prosource TF20 60 ml daily Provides 2060 kcal, 102 gm protein, 1006 ml free water daily              - Recommend continuing tube feeds until patient consistently meeting >50% of estimated needs with oral intake.   Trude Ned RD, LDN Contact via [employer].

## 2024-04-30 NOTE — Discharge Instructions (Signed)
 Information on my medicine - ELIQUIS  (apixaban )  This medication education was reviewed with me or my healthcare representative as part of my discharge preparation.  Why was Eliquis  prescribed for you? Eliquis  was prescribed to treat blood clots that may have been found in the veins of your legs (deep vein thrombosis) or in your lungs (pulmonary embolism) and to reduce the risk of them occurring again.  What do You need to know about Eliquis  ? The dose is ONE 5 mg tablet taken TWICE daily.  Eliquis  may be taken with or without food.   Try to take the dose about the same time in the morning and in the evening. If you have difficulty swallowing the tablet whole please discuss with your pharmacist how to take the medication safely.  Take Eliquis  exactly as prescribed and DO NOT stop taking Eliquis  without talking to the doctor who prescribed the medication.  Stopping may increase your risk of developing a new blood clot.  Refill your prescription before you run out.  After discharge, you should have regular check-up appointments with your healthcare provider that is prescribing your Eliquis .    What do you do if you miss a dose? If a dose of ELIQUIS  is not taken at the scheduled time, take it as soon as possible on the same day and twice-daily administration should be resumed. The dose should not be doubled to make up for a missed dose.  Important Safety Information A possible side effect of Eliquis  is bleeding. You should call your healthcare provider right away if you experience any of the following: ? Bleeding from an injury or your nose that does not stop. ? Unusual colored urine (red or dark brown) or unusual colored stools (red or black). ? Unusual bruising for unknown reasons. ? A serious fall or if you hit your head (even if there is no bleeding).  Some medicines may interact with Eliquis  and might increase your risk of bleeding or clotting while on Eliquis . To help avoid  this, consult your healthcare provider or pharmacist prior to using any new prescription or non-prescription medications, including herbals, vitamins, non-steroidal anti-inflammatory drugs (NSAIDs) and supplements.  This website has more information on Eliquis  (apixaban ): [email or web address] [name]

## 2024-04-30 NOTE — Plan of Care (Signed)
  Problem: Clinical Measurements: Goal: Ability to maintain clinical measurements within normal limits will improve Outcome: Progressing Goal: Respiratory complications will improve Outcome: Progressing Goal: Cardiovascular complication will be avoided Outcome: Progressing   Problem: Nutrition: Goal: Adequate nutrition will be maintained Outcome: Progressing   Problem: Elimination: Goal: Will not experience complications related to bowel motility Outcome: Progressing   Problem: Skin Integrity: Goal: Risk for impaired skin integrity will decrease Outcome: Progressing   Problem: Pain Managment: Goal: General experience of comfort will improve and/or be controlled Outcome: Not Progressing

## 2024-04-30 NOTE — Progress Notes (Signed)
 PROGRESS NOTE    [name]  FMW:[id number] DOB: 05/24/1956 DOA: 04/19/2024 PCP: [name], NP   Brief Narrative:  68 year old male with history of DM2, HTN, HLD, rheumatoid arthritis, prior CVA, tobacco and alcohol use comes into the hospital and is admitted on 6/23 with abdominal pain, found to have necrotizing pancreatitis. Hospital course complicated by persistent abdominal pain requiring NG tube placement for feeding, also hypoxic respiratory failure requiring high flow oxygen. He was also found to have a thrombus extending from the SMV and the portal splenic confluence. Has been placed on anticoagulation. GI consulted early on but they signed off. Surgery also consulted due to necrotizing pancreatitis   Assessment & Plan:   Principal Problem:   Pancreatitis, alcoholic, acute Active Problems:   Malnutrition of moderate degree   Hemoptysis   Acute hypoxemic respiratory failure (HCC)  Acute necrotizing pancreatitis, EtOH induced -imaging on admission showed acute pancreatitis with findings suggestive of necrotizing pancreatitis.  With no abscess or pseudocyst were seen.  GI consulted, now signed off.  General surgery also consulted, no indication for surgery and they signed off as well.  Continue multimodal pain management and supportive care.  Tolerating dysphagia 3 diet.  Will continue that.  Continue tube feeds until nutritional needs met through oral intake.   Acute hypoxic respiratory failure and sepsis secondary to aspiration pneumonia, sepsis not present on admission: - Chest x-ray does not show any significant ARDS, however CT chest completed on 04/25/2024 shows extensive consolidation in the lung bases.  Patient's leukocytosis is worsening at 24 today and he has developed fever again with latest temperature of 102 at 11:30 PM on 04/27/2024.  Has been restarted on Rocephin  since 04/26/2024.  Currently on 2 L of high flow oxygen, will wean as able to.  Encouraged incentive  spirometry.   SMV portal splenic thrombus/hemoptysis- This is likely due to pancreatitis.  Thrombus is nonocclusive.  Heparin  was held due to hemoptysis on early morning of 04/27/2024 but resumed later on the same day.  Monitor closely.  Hemoglobin has now remained stable for last 2 to 3 days, no further hemoptysis, will transition to DOAC today.   Essential hypertension-pretty hypertensive on admission, has been placed on amlodipine , Coreg .  Continue that, blood pressure improving.   EtOH use-no withdrawals noted, status post Librium  taper, out of the window for withdrawal.   Hypokalemia-replaced/resolved.  Mild acute hyponatremia: 128 today.  Monitor for now.   Chronic diastolic CHF-now being diuresed with IV furosemide .   RA-does not appear to be on any medications at home   Anemia of critical illness-hemoglobin with slight drop to 8.5 today.  No indication of transfusion.  Monitor closely and transfuse if less than 7.   Thrombocytopenia-in the setting of EtOH use however this has resolved.   DM2, with hyperglycemia-currently on 30 units of Lantus  and SSI, still slightly hyperglycemic, increase Lantus  to 35 units.   DVT prophylaxis: Heparin    Code Status: Full Code  Family Communication:  None present at bedside.  Plan of care discussed with patient in length and he/she verbalized understanding and agreed with it.  Status is: Inpatient Remains inpatient appropriate because: Still tube feeds dependent.   Estimated body mass index is 24.14 kg/m as calculated from the following:   Height as of this encounter: 5' 7 (1.702 m).   Weight as of this encounter: 69.9 kg.    Nutritional Assessment: Body mass index is 24.14 kg/m.SABRA Seen by dietician.  I agree with the assessment  and plan as outlined below: Nutrition Status: Nutrition Problem: Moderate Malnutrition Etiology: chronic illness (alcohol abuse; CHF) Signs/Symptoms: mild fat depletion, moderate muscle  depletion Interventions: Refer to RD note for recommendations, Tube feeding  . Skin Assessment: I have examined the patient's skin and I agree with the wound assessment as performed by the wound care RN as outlined below:    Consultants:  Critical care  Procedures:  None  Antimicrobials:  Anti-infectives (From admission, onward)    Start     Dose/Rate Route Frequency Ordered Stop   04/26/24 0845  cefTRIAXone  (ROCEPHIN ) 2 g in sodium chloride  0.9 % 100 mL IVPB        2 g 200 mL/hr over 30 Minutes Intravenous Every 24 hours 04/26/24 0750     04/20/24 1800  meropenem  (MERREM ) 1 g in sodium chloride  0.9 % 100 mL IVPB  Status:  Discontinued        1 g 200 mL/hr over 30 Minutes Intravenous Every 12 hours 04/19/24 1725 04/19/24 1751   04/19/24 1715  meropenem  (MERREM ) 1 g in sodium chloride  0.9 % 100 mL IVPB  Status:  Discontinued        1 g 200 mL/hr over 30 Minutes Intravenous Every 8 hours 04/19/24 1705 04/19/24 1725   04/19/24 1415  cefTRIAXone  (ROCEPHIN ) 2 g in sodium chloride  0.9 % 100 mL IVPB        2 g 200 mL/hr over 30 Minutes Intravenous Once 04/19/24 1404 04/19/24 1458   04/19/24 1415  metroNIDAZOLE  (FLAGYL ) IVPB 500 mg        500 mg 100 mL/hr over 60 Minutes Intravenous  Once 04/19/24 1404 04/19/24 1521   04/19/24 1345  vancomycin  (VANCOREADY) IVPB 1500 mg/300 mL  Status:  Discontinued        1,500 mg 150 mL/hr over 120 Minutes Intravenous  Once 04/19/24 1333 04/19/24 1403   04/19/24 1330  ceFEPIme  (MAXIPIME ) 2 g in sodium chloride  0.9 % 100 mL IVPB  Status:  Discontinued        2 g 200 mL/hr over 30 Minutes Intravenous  Once 04/19/24 1326 04/19/24 1403   04/19/24 1330  metroNIDAZOLE  (FLAGYL ) IVPB 500 mg  Status:  Discontinued        500 mg 100 mL/hr over 60 Minutes Intravenous  Once 04/19/24 1326 04/19/24 1403   04/19/24 1330  vancomycin  (VANCOCIN ) IVPB 1000 mg/200 mL premix  Status:  Discontinued        1,000 mg 200 mL/hr over 60 Minutes Intravenous  Once 04/19/24  1326 04/19/24 1332         Subjective: Seen and examined.  Continues to improve.  Continues to complain of mild abdominal pain.  No nausea though.  Objective: Vitals:   04/30/24 0000 04/30/24 0100 04/30/24 0440 04/30/24 0500  BP: (!) 152/58 (!) 121/51    Pulse:      Resp: (!) 25 (!) 24    Temp: 99 F (37.2 C)  100 F (37.8 C)   TempSrc: Axillary  Axillary   SpO2:      Weight:    69.9 kg  Height:        Intake/Output Summary (Last 24 hours) at 04/30/2024 0748 Last data filed at 04/30/2024 0200 Gross per 24 hour  Intake 3710.32 ml  Output 2300 ml  Net 1410.32 ml   Filed Weights   04/28/24 0500 04/29/24 1448 04/30/24 0500  Weight: 54.1 kg 68.1 kg 69.9 kg    Examination:  General exam: Appears calm and comfortable  Respiratory system: Clear to auscultation. Respiratory effort normal. Cardiovascular system: S1 & S2 heard, RRR. No JVD, murmurs, rubs, gallops or clicks. No pedal edema. Gastrointestinal system: Abdomen is nondistended, soft and mild suprapubic and tenderness.  No organomegaly or masses felt. Normal bowel sounds heard. Central nervous system: Alert and oriented. No focal neurological deficits. Extremities: Symmetric 5 x 5 power. Skin: No rashes, lesions or ulcers.  Psychiatry: Judgement and insight appear normal. Mood & affect appropriate.   Data Reviewed: I have personally reviewed following labs and imaging studies  CBC: Recent Labs  Lab 04/24/24 0024 04/24/24 0524 04/25/24 0513 04/26/24 0340 04/27/24 0008 04/28/24 0102 04/29/24 0534  WBC 12.3*   < > 15.5* 18.7* 21.2* 24.8* 20.8*  NEUTROABS 7.9*  --   --   --   --   --   --   HGB 11.0*   < > 10.6* 10.3* 9.2* 8.7* 8.5*  HCT 33.8*   < > 33.1* 32.0* 27.5* 25.5* 25.4*  MCV 102.1*   < > 104.7* 101.6* 100.7* 98.5 98.4  PLT 96*   < > 139* 183 223 278 342   < > = values in this interval not displayed.   Basic Metabolic Panel: Recent Labs  Lab 04/23/24 1647 04/24/24 0524 04/24/24 1712  04/25/24 0513 04/26/24 0340 04/27/24 0008 04/28/24 0102  NA  --  141  --  139 131* 130* 128*  K  --  3.4*  --  3.9 3.8 3.5 3.8  CL  --  108  --  105 96* 92* 90*  CO2  --  24  --  24 24 28 27   GLUCOSE  --  198*  --  302* 312* 155* 154*  BUN  --  15  --  17 16 17 17   CREATININE  --  0.71  --  0.78 0.80 0.72 0.81  CALCIUM   --  8.7*  --  8.8* 8.7* 8.5* 8.3*  MG 2.1 2.1 2.0 1.9 1.8  --   --   PHOS 2.5 3.0 2.8 3.7 3.4  --   --    GFR: Estimated Creatinine Clearance: 82.7 mL/min (by C-G formula based on SCr of 0.81 mg/dL). Liver Function Tests: Recent Labs  Lab 04/24/24 0524 04/25/24 0513 04/26/24 0340 04/27/24 0008 04/28/24 0102  AST 51* 47* 77* 51* 47*  ALT 32 39 51* 45* 40  ALKPHOS 87 93 112 101 99  BILITOT 1.0 1.3* 1.0 0.7 0.9  PROT 6.1* 6.4* 6.5 6.3* 6.5  ALBUMIN  2.4* 2.5* 2.5* 2.3* 2.2*   Recent Labs  Lab 04/26/24 0340  LIPASE 99*   No results for input(s): AMMONIA in the last 168 hours. Coagulation Profile: No results for input(s): INR, PROTIME in the last 168 hours. Cardiac Enzymes: No results for input(s): CKTOTAL, CKMB, CKMBINDEX, TROPONINI in the last 168 hours. BNP (last 3 results) No results for input(s): PROBNP in the last 8760 hours. HbA1C: No results for input(s): HGBA1C in the last 72 hours. CBG: Recent Labs  Lab 04/29/24 1550 04/29/24 1944 04/29/24 2308 04/30/24 0346 04/30/24 0717  GLUCAP 371* 256* 263* 218* 183*   Lipid Profile: No results for input(s): CHOL, HDL, LDLCALC, TRIG, CHOLHDL, LDLDIRECT in the last 72 hours. Thyroid Function Tests: No results for input(s): TSH, T4TOTAL, FREET4, T3FREE, THYROIDAB in the last 72 hours. Anemia Panel: No results for input(s): VITAMINB12, FOLATE, FERRITIN, TIBC, IRON, RETICCTPCT in the last 72 hours. Sepsis Labs: No results for input(s): PROCALCITON, LATICACIDVEN in the last 168 hours.  Recent  Results (from the past 240 hours)  Culture, blood  (Routine X 2) w Reflex to ID Panel     Status: None (Preliminary result)   Collection Time: 04/26/24  5:41 AM   Specimen: BLOOD  Result Value Ref Range Status   Specimen Description   Final    BLOOD BLOOD RIGHT ARM Performed at [facility], [address], [place], [place] [address]    Special Requests   Final    BOTTLES DRAWN AEROBIC ONLY Blood Culture adequate volume Performed at [facility], [address] [place], [place] [address]    Culture   Final    NO GROWTH 4 DAYS Performed at [facility], [address] [place], [place] [address]    Report Status PENDING  Incomplete  Culture, blood (Routine X 2) w Reflex to ID Panel     Status: None (Preliminary result)   Collection Time: 04/26/24  5:41 AM   Specimen: BLOOD  Result Value Ref Range Status   Specimen Description   Final    BLOOD BLOOD RIGHT ARM Performed at [facility], [address] [place], [place] [address]    Special Requests   Final    BOTTLES DRAWN AEROBIC ONLY Blood Culture adequate volume Performed at [facility], [address] [place], [place] [address]    Culture   Final    NO GROWTH 4 DAYS Performed at [facility], [address], [place], [place] [address]    Report Status PENDING  Incomplete     Radiology Studies: No results found.  Scheduled Meds:  amLODipine   10 mg Oral Daily   carvedilol   12.5 mg Oral BID WC   Chlorhexidine  Gluconate Cloth  6 each Topical Daily   feeding supplement  1 Container Oral TID BM   feeding supplement (PROSource TF20)  60 mL Per Tube Daily   folic acid   1 mg Intravenous Daily   furosemide   20 mg Intravenous Q12H   insulin  aspart  0-15 Units Subcutaneous Q4H   insulin  aspart  6 Units Subcutaneous Q4H   insulin  glargine-yfgn  30 Units Subcutaneous Daily   multivitamin  15 mL Per Tube Daily   nicotine   21 mg Transdermal Daily   thiamine   100 mg Oral Daily   Or    thiamine   100 mg Intravenous Daily   Continuous Infusions:  cefTRIAXone  (ROCEPHIN )  IV Stopped (04/29/24 0859)   feeding supplement (OSMOLITE 1.5 CAL) 55 mL/hr at 04/30/24 0042   heparin  1,650 Units/hr (04/30/24 0329)     LOS: 11 days   Fredia Skeeter, MD [facility]  04/30/2024, 7:48 AM   *Please note that this is a verbal dictation therefore any spelling or grammatical errors are due to the [employer] system interpretation.  Please page via Amion and do not message via secure chat for urgent patient care matters. Secure chat can be used for non urgent patient care matters.  How to contact the [facility] Attending or Consulting provider 7A - 7P or covering provider during after hours 7P -7A, for this patient?  Check the care team in [facility] and look for a) attending/consulting [facility] provider listed and b) the [facility] team listed. Page or secure chat 7A-7P. Log into [email or web address] and use [facility]'s universal password to access. If you do not have the password, please contact the hospital operator. Locate the [facility] provider you are looking for under [facility] and page to a  number that you can be directly reached. If you still have difficulty reaching the provider, please page the [facility] (Director on Call) for the Hospitalists listed on amion for assistance.

## 2024-04-30 NOTE — Evaluation (Signed)
 Occupational Therapy Evaluation Patient Details Name: [name] MRN: [id number] DOB: 05/07/1956 Today's Date: 04/30/2024   History of Present Illness   68 yr old male admitted to the hospital on 04/19/2024 secondary to acute necrotizing pancreatis attributed to ETOH. Pt also found to have SMV thrombosis and respiratory failure. Pt PMH includes but is not limited to: alcohol abuse, CHF, DM II, GERD, hepatitis, HLD, HTN, OA, pancreatis, RA, CVA, tobacco abuse.     Clinical Impressions The pt is currently presenting well below his baseline level of functioning for self-care management, as he is limited by the below listed deficits (see OT problem list). During the session, he required mod assist to stand using a RW, max assist for lower body dressing, and min assist x2 to step-pivot from the chair to bed. He was also noted to be with general deconditioning, chronic R shoulder AROM limitations, and reports of significant abdominal pain. He will benefit from further OT services to maximize his independence with self-care tasks, and to decrease the risk for further weakness and deconditioning. Patient will benefit from continued inpatient follow up therapy, <3 hours/day.      If plan is discharge home, recommend the following:   A lot of help with walking and/or transfers;A lot of help with bathing/dressing/bathroom;Help with stairs or ramp for entrance;Assistance with cooking/housework     Functional Status Assessment   Patient has had a recent decline in their functional status and demonstrates the ability to make significant improvements in function in a reasonable and predictable amount of time.     Equipment Recommendations   Tub/shower seat     Recommendations for Other Services         Precautions/Restrictions   Precautions Precautions: Fall Restrictions Weight Bearing Restrictions Per Provider Order: No     Mobility Bed Mobility Overal bed mobility: Needs  Assistance Bed Mobility: Sit to Supine       Sit to supine: Min assist, HOB elevated, Used rails   General bed mobility comments: min a for B LE  and cues    Transfers Overall transfer level: Needs assistance Equipment used: Rolling walker (2 wheels) Transfers: Sit to/from Stand, Bed to chair/wheelchair/BSC Sit to Stand: Mod assist Stand pivot transfers: Min assist, +2 physical assistance, +2 safety/equipment         General transfer comment: mod A for sit to stand from recliner with cues and faciltiation for proper UE placement, extension posture and WB through anterior portion of B feet, pt able to progress to min A for standing balance with ongoing cues and step-pivot with min A x 2 for safety and line/lead management      Balance     Sitting balance-Leahy Scale: Fair       Standing balance-Leahy Scale: Poor           ADL either performed or assessed with clinical judgement   ADL Overall ADL's : Needs assistance/impaired Eating/Feeding: Set up;Sitting   Grooming: Supervision/safety;Set up;Sitting           Upper Body Dressing : Minimal assistance;Sitting   Lower Body Dressing: Maximal assistance;Sitting/lateral leans       Toileting- Clothing Manipulation and Hygiene: Maximal assistance;Sit to/from stand Toileting - Clothing Manipulation Details (indicate cue type and reason): at bedside commode level, based on clinical judgement              Pertinent Vitals/Pain Pain Assessment Pain Assessment: 0-10 Pain Score: 10-Worst pain ever Pain Location: abdomen Pain Intervention(s): Limited activity within  patient's tolerance, Monitored during session, Repositioned, Patient requesting pain meds-RN notified     Extremity/Trunk Assessment Upper Extremity Assessment Upper Extremity Assessment: Right hand dominant;Generalized weakness;RUE deficits/detail;LUE deficits/detail RUE Deficits / Details: chronic shoulder AROM limitations, which he attributed  to an old rotator cuff injury. Elbow and hand AROM WFL. Functional grip strength LUE Deficits / Details: AROM WFL. Functional grip strength   Lower Extremity Assessment Lower Extremity Assessment: Generalized weakness;LLE deficits/detail;RLE deficits/detail RLE Deficits / Details: AROM WFL LLE Deficits / Details: AROM WFL       Communication Communication Communication: No apparent difficulties   Cognition Arousal: Alert Behavior During Therapy: WFL for tasks assessed/performed               OT - Cognition Comments: Oriented to person, place, year, and situation. He reported the month to be June.          Following commands: Intact                  Home Living Family/patient expects to be discharged to:: Private residence Living Arrangements: Alone Available Help at Discharge: Family Type of Home: House Home Access: Stairs to enter Secretary/administrator of Steps:  (2-3) Entrance Stairs-Rails: None Home Layout: One level     Bathroom Shower/Tub: Chief Strategy Officer: Handicapped height     Home Equipment: Cane - single Librarian, academic (2 wheels)          Prior Functioning/Environment Prior Level of Function : Independent/Modified Independent;Driving             Mobility Comments:  (He used a cane vs. rolling walker for ambulation.) ADLs Comments:  (He was modified independent to independent with ADLs, light meal prep, and cleaning.)    OT Problem List: Decreased strength;Decreased range of motion;Decreased activity tolerance;Impaired balance (sitting and/or standing);Decreased knowledge of use of DME or AE;Pain   OT Treatment/Interventions: Self-care/ADL training;Therapeutic exercise;Therapeutic activities;Energy conservation;Patient/family education;DME and/or AE instruction;Balance training      OT Goals(Current goals can be found in the care plan section)   Acute Rehab OT Goals OT Goal Formulation: With patient Time  For Goal Achievement: 05/14/24 Potential to Achieve Goals: Good ADL Goals Pt Will Perform Upper Body Dressing: with set-up;sitting Pt Will Perform Lower Body Dressing: with supervision;with set-up;sit to/from stand;sitting/lateral leans Pt Will Transfer to Toilet: with supervision;ambulating Pt Will Perform Toileting - Clothing Manipulation and hygiene: with supervision;sit to/from stand   OT Frequency:  Min 2X/week    Co-evaluation PT/OT/SLP Co-Evaluation/Treatment: Yes Reason for Co-Treatment: Complexity of the patient's impairments (multi-system involvement);Necessary to address cognition/behavior during functional activity;For patient/therapist safety PT goals addressed during session: Mobility/safety with mobility OT goals addressed during session: ADL's and self-care      AM-PAC OT 6 Clicks Daily Activity     Outcome Measure Help from another person eating meals?: None Help from another person taking care of personal grooming?: A Little Help from another person toileting, which includes using toliet, bedpan, or urinal?: A Lot Help from another person bathing (including washing, rinsing, drying)?: A Lot Help from another person to put on and taking off regular upper body clothing?: A Little Help from another person to put on and taking off regular lower body clothing?: A Lot 6 Click Score: 16   End of Session Equipment Utilized During Treatment: Rolling walker (2 wheels);Gait belt;Oxygen Nurse Communication: Mobility status;Patient requests pain meds  Activity Tolerance: Patient limited by pain Patient left: in bed;with call bell/phone within reach;with bed alarm set  OT Visit Diagnosis: Unsteadiness on feet (R26.81);Other abnormalities of gait and mobility (R26.89);Muscle weakness (generalized) (M62.81);Pain Pain - part of body:  (abdomen)                Time: 8976-8954 OT Time Calculation (min): 22 min Charges:  OT General Charges $OT Visit: 1 Visit OT  Evaluation $OT Eval Moderate Complexity: 1 Mod   Ardith Lewman L Yukie Bergeron, OTR/L 04/30/2024, 12:20 PM

## 2024-04-30 NOTE — Progress Notes (Signed)
 PHARMACY - ANTICOAGULATION CONSULT NOTE  Pharmacy Consult for apixaban   Indication: SMV thrombus  Allergies  Allergen Reactions   Lisinopril  Swelling and Other (See Comments)    angioedema   Citalopram Nausea Only   Morphine  Nausea And Vomiting   Nsaids Other (See Comments)    Stomach Irritability    Paroxetine Nausea And Vomiting    Patient Measurements: Height: 5' 7 (170.2 cm) Weight: 69.9 kg (154 lb 1.6 oz) IBW/kg (Calculated) : 66.1 HEPARIN  DW (KG): 65.6  Vital Signs: Temp: 100 F (37.8 C) (07/04 0440) Temp Source: Axillary (07/04 0440) BP: 121/51 (07/04 0100) Pulse Rate: 83 (07/03 2100)  Labs: Recent Labs    04/28/24 0102 04/29/24 0534 04/29/24 0536 04/29/24 1420 04/29/24 2207  HGB 8.7* 8.5*  --   --   --   HCT 25.5* 25.4*  --   --   --   PLT 278 342  --   --   --   HEPARINUNFRC 0.35  --  0.23* 0.20* 0.30  CREATININE 0.81  --   --   --   --     Estimated Creatinine Clearance: 82.7 mL/min (by C-G formula based on SCr of 0.81 mg/dL).   Medical History: Past Medical History:  Diagnosis Date   Alcohol abuse    Allergic rhinitis    CHF (congestive heart failure) (HCC)    Diabetes mellitus without complication (HCC)    'sometime last yr'--type 2   GERD (gastroesophageal reflux disease)    Hepatitis    he thinks its hep b or c   Hyperlipidemia    Hypertension    Osteoarthritis    Pancreatitis    Rheumatoid arthritis(714.0)    Stroke (HCC)    Tobacco user     Assessment: Patient is a 68 y.o M with hx EtOH abuse and pancreatitis who presented to the ED on 04/19/24 with c/o abdominal pain.  Abdominal CT on 04/19/24 showed findings with concern for necrotizing pancreatitis and thrombus extending from the SMV into the porta splenic confluence and proximal main portal vein.    Pt was started on heparin  drip for anticoagulation on admission. Heparin  was stopped in the afternoon on 6/28 for hemoptysis, resolved and restarted.  Heparin  was briefly held on  7/1 and then resumed.  Pharmacy is consulted to dose apixaban  on 7/4.   Today, 04/30/2024: Heparin  level 0.36, therapeutic CBC: Hgb improved/low at 9, Plt elevated No bleeding or infusion related concerns reported   Goal of Therapy:  Heparin  level 0.3-0.7 units/ml Monitor platelets by anticoagulation protocol: Yes   Plan:  Stop heparin  and start apixaban  at the same time. Apixaban  5mg  PO BID (omit loading dose d/t heparin  > 7 days and higher risk of bleeding with recent hemoptysis) Pharmacy to provide education prior to discharge.  Monitor for signs of bleeding   Wanda Hasting PharmD, BCPS [facility] main pharmacy [phone] 04/30/2024 7:11 AM

## 2024-04-30 NOTE — Evaluation (Signed)
 Physical Therapy Evaluation Patient Details Name: [name] MRN: [id number] DOB: May 26, 1956 Today's Date: 04/30/2024  History of Present Illness  68 yo male presents to therapy following hospital admission on 04/19/2024 secondary to acute necrotizing pancreatis attributed to ETOH. 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 Pt PMH includes but is not limited to: alcohol abuse, CHF, DM II, GERD, hepatitis, HLD, HTN, OA, pancreatis, RA, CVA, tobacco abuse, and B THA.  Clinical Impression   Pt admitted with above diagnosis.  Pt currently with functional limitations due to the deficits listed below (see PT Problem List). Pt seated in recliner when therapist arrived. Pt agreeable to therapy intervention. Pt reported 10/10 abdominal pain. Pt noted to have UE ROM deficits and c/o R LE stiffness with hx of B THA. Pt required mod A for sit to stand  from recliner with pt able to push to stand from arm rests, pt exhibits posterior lean, required A for power up and facilitation for anterior weight shift, pt able to progress to min A for static standing and SPT min A x 2 to return to bed, pt required min A for B LE for sit to supine. Pt left in bed and all needs in place.  Patient will benefit from continued inpatient follow up therapy, <3 hours/day.  Pt will benefit from acute skilled PT to increase their independence and safety with mobility to allow discharge.         If plan is discharge home, recommend the following: Two people to help with walking and/or transfers;A lot of help with bathing/dressing/bathroom;Assistance with cooking/housework;Help with stairs or ramp for entrance;Assist for transportation   Can travel by private vehicle   Yes    Equipment Recommendations None recommended by PT  Recommendations for Other Services       Functional Status Assessment Patient has had a recent  decline in their functional status and demonstrates the ability to make significant improvements in function in a reasonable and predictable amount of time.     Precautions / Restrictions Precautions Precautions: Fall Restrictions Weight Bearing Restrictions Per Provider Order: No      Mobility  Bed Mobility Overal bed mobility: Needs Assistance Bed Mobility: Sit to Supine       Sit to supine: Min assist, HOB elevated, Used rails   General bed mobility comments: min a for B LE  and cues    Transfers Overall transfer level: Needs assistance Equipment used: Rolling walker (2 wheels) Transfers: Sit to/from Stand, Bed to chair/wheelchair/BSC Sit to Stand: Mod assist Stand pivot transfers: Min assist, +2 physical assistance, +2 safety/equipment         General transfer comment: mod A for sit to stand from recliner with cues and faciltiation for proper UE placement, extension posture and WB through anterior portion of B feet, pt able to progress to min A for standing balance with ongoing cues and SPT with min A x 2 for safety and line/lead management    Ambulation/Gait               General Gait Details: NT  Stairs            Wheelchair Mobility     Tilt Bed    Modified Rankin (Stroke Patients Only)       Balance Overall balance assessment: Needs assistance Sitting-balance support: Feet supported, Bilateral upper extremity supported Sitting balance-Leahy Scale: Fair  Standing balance support: Bilateral upper extremity supported, During functional activity, Reliant on assistive device for balance Standing balance-Leahy Scale: Poor                               Pertinent Vitals/Pain Pain Assessment Pain Assessment: 0-10 Pain Score: 10-Worst pain ever Pain Location: abdomen Pain Descriptors / Indicators: Aching, Constant, Discomfort, Dull, Grimacing Pain Intervention(s): Limited activity within patient's tolerance, Monitored during  session, Repositioned, Patient requesting pain meds-RN notified    Home Living Family/patient expects to be discharged to:: Private residence Living Arrangements: Alone Available Help at Discharge: Family Type of Home: House Home Access: Stairs to enter Entrance Stairs-Rails: None Entrance Stairs-Number of Steps: 1   Home Layout: One level Home Equipment: Rollator (4 wheels);Cane - single Librarian, academic (2 wheels)      Prior Function Prior Level of Function : Independent/Modified Independent             Mobility Comments: pt reports intermittent use of SPC or walker for mobility, mod I with all ADLs, self care tasks and IADLs       Extremity/Trunk Assessment   Upper Extremity Assessment Upper Extremity Assessment: Defer to OT evaluation    Lower Extremity Assessment Lower Extremity Assessment: Generalized weakness       Communication   Communication Communication: No apparent difficulties    Cognition Arousal: Alert Behavior During Therapy: WFL for tasks assessed/performed   PT - Cognitive impairments: No apparent impairments                         Following commands: Intact       Cueing       General Comments      Exercises     Assessment/Plan    PT Assessment Patient needs continued PT services  PT Problem List Decreased strength;Decreased range of motion;Decreased activity tolerance;Decreased balance;Decreased mobility;Decreased coordination;Pain       PT Treatment Interventions DME instruction;Gait training;Stair training;Functional mobility training;Therapeutic activities;Therapeutic exercise;Balance training;Neuromuscular re-education;Patient/family education    PT Goals (Current goals can be found in the Care Plan section)  Acute Rehab PT Goals Patient Stated Goal: to get out of the hospital PT Goal Formulation: With patient Time For Goal Achievement: 05/14/24 Potential to Achieve Goals: Good    Frequency Min  2X/week     Co-evaluation PT/OT/SLP Co-Evaluation/Treatment: Yes Reason for Co-Treatment: Complexity of the patient's impairments (multi-system involvement);Necessary to address cognition/behavior during functional activity;For patient/therapist safety PT goals addressed during session: Mobility/safety with mobility;Balance;Proper use of DME OT goals addressed during session: ADL's and self-care;Proper use of Adaptive equipment and DME       AM-PAC PT 6 Clicks Mobility  Outcome Measure Help needed turning from your back to your side while in a flat bed without using bedrails?: A Little Help needed moving from lying on your back to sitting on the side of a flat bed without using bedrails?: A Lot Help needed moving to and from a bed to a chair (including a wheelchair)?: A Little Help needed standing up from a chair using your arms (e.g., wheelchair or bedside chair)?: A Lot Help needed to walk in hospital room?: Total Help needed climbing 3-5 steps with a railing? : Total 6 Click Score: 12    End of Session Equipment Utilized During Treatment: Gait belt Activity Tolerance: Patient limited by fatigue;Patient limited by pain Patient left: in bed;with call bell/phone within reach;with bed  alarm set Nurse Communication: Mobility status;Patient requests pain meds PT Visit Diagnosis: Unsteadiness on feet (R26.81);Other abnormalities of gait and mobility (R26.89);Muscle weakness (generalized) (M62.81);Pain Pain - part of body:  (abdomen)    Time: 8976-8954 PT Time Calculation (min) (ACUTE ONLY): 22 min   Charges:   PT Evaluation $PT Eval Low Complexity: 1 Low   PT General Charges $$ ACUTE PT VISIT: 1 Visit         [place], PT Acute Rehab   [place] VEAR Drone 04/30/2024, 11:37 AM

## 2024-05-01 ENCOUNTER — Inpatient Hospital Stay (HOSPITAL_COMMUNITY)

## 2024-05-01 DIAGNOSIS — K8521 Alcohol induced acute pancreatitis with uninfected necrosis: Secondary | ICD-10-CM | POA: Diagnosis not present

## 2024-05-01 LAB — GLUCOSE, CAPILLARY
Glucose-Capillary: 178 mg/dL — ABNORMAL HIGH (ref 70–99)
Glucose-Capillary: 193 mg/dL — ABNORMAL HIGH (ref 70–99)
Glucose-Capillary: 208 mg/dL — ABNORMAL HIGH (ref 70–99)
Glucose-Capillary: 210 mg/dL — ABNORMAL HIGH (ref 70–99)
Glucose-Capillary: 279 mg/dL — ABNORMAL HIGH (ref 70–99)
Glucose-Capillary: 282 mg/dL — ABNORMAL HIGH (ref 70–99)

## 2024-05-01 LAB — CBC
HCT: 24.9 % — ABNORMAL LOW (ref 39.0–52.0)
Hemoglobin: 8.3 g/dL — ABNORMAL LOW (ref 13.0–17.0)
MCH: 32.4 pg (ref 26.0–34.0)
MCHC: 33.3 g/dL (ref 30.0–36.0)
MCV: 97.3 fL (ref 80.0–100.0)
Platelets: 442 K/uL — ABNORMAL HIGH (ref 150–400)
RBC: 2.56 MIL/uL — ABNORMAL LOW (ref 4.22–5.81)
RDW: 12.1 % (ref 11.5–15.5)
WBC: 21.1 K/uL — ABNORMAL HIGH (ref 4.0–10.5)
nRBC: 0 % (ref 0.0–0.2)

## 2024-05-01 LAB — COMPREHENSIVE METABOLIC PANEL WITH GFR
ALT: 32 U/L (ref 0–44)
AST: 29 U/L (ref 15–41)
Albumin: 2.2 g/dL — ABNORMAL LOW (ref 3.5–5.0)
Alkaline Phosphatase: 99 U/L (ref 38–126)
Anion gap: 13 (ref 5–15)
BUN: 16 mg/dL (ref 8–23)
CO2: 25 mmol/L (ref 22–32)
Calcium: 9.1 mg/dL (ref 8.9–10.3)
Chloride: 93 mmol/L — ABNORMAL LOW (ref 98–111)
Creatinine, Ser: 0.79 mg/dL (ref 0.61–1.24)
GFR, Estimated: >60 mL/min (ref >60)
Glucose, Bld: 227 mg/dL — ABNORMAL HIGH (ref 70–99)
Potassium: 3.3 mmol/L — ABNORMAL LOW (ref 3.5–5.1)
Sodium: 131 mmol/L — ABNORMAL LOW (ref 135–145)
Total Bilirubin: 0.6 mg/dL (ref 0.0–1.2)
Total Protein: 6.7 g/dL (ref 6.5–8.1)

## 2024-05-01 LAB — BRAIN NATRIURETIC PEPTIDE: B Natriuretic Peptide: 175 pg/mL — ABNORMAL HIGH (ref 0.0–100.0)

## 2024-05-01 LAB — C DIFFICILE (CDIFF) QUICK SCRN (NO PCR REFLEX)
C Diff antigen: POSITIVE — AB
C Diff interpretation: DETECTED
C Diff toxin: POSITIVE — AB

## 2024-05-01 LAB — CULTURE, BLOOD (ROUTINE X 2)
Culture: NO GROWTH
Culture: NO GROWTH
Special Requests: ADEQUATE
Special Requests: ADEQUATE

## 2024-05-01 MED ORDER — VANCOMYCIN HCL 125 MG PO CAPS
125.0000 mg | ORAL_CAPSULE | Freq: Four times a day (QID) | ORAL | Status: DC
  Administered 2024-05-01 – 2024-05-06 (×19): 125 mg via ORAL
  Filled 2024-05-01 (×20): qty 1

## 2024-05-01 MED ORDER — MELATONIN 5 MG PO TABS
5.0000 mg | ORAL_TABLET | Freq: Every evening | ORAL | Status: DC | PRN
  Administered 2024-05-02 – 2024-05-03 (×2): 5 mg via ORAL
  Filled 2024-05-01 (×2): qty 1

## 2024-05-01 MED ORDER — INSULIN GLARGINE-YFGN 100 UNIT/ML ~~LOC~~ SOLN
40.0000 [IU] | Freq: Every day | SUBCUTANEOUS | Status: DC
  Administered 2024-05-01: 40 [IU] via SUBCUTANEOUS
  Filled 2024-05-01 (×2): qty 0.4

## 2024-05-01 NOTE — Plan of Care (Signed)
   Problem: Education: Goal: Knowledge of General Education information will improve Description: Including pain rating scale, medication(s)/side effects and non-pharmacologic comfort measures Outcome: Progressing   Problem: Clinical Measurements: Goal: Ability to maintain clinical measurements within normal limits will improve Outcome: Progressing Goal: Diagnostic test results will improve Outcome: Progressing

## 2024-05-01 NOTE — Progress Notes (Signed)
 PROGRESS NOTE    [name]  FMW:[id number] DOB: [birth year] DOA: 04/19/2024 PCP: [name], NP   Brief Narrative:  68 year old male with history of DM2, HTN, HLD, rheumatoid arthritis, prior CVA, tobacco and alcohol use comes into the hospital and is admitted on 6/23 with abdominal pain, found to have necrotizing pancreatitis. Hospital course complicated by persistent abdominal pain requiring NG tube placement for feeding, also hypoxic respiratory failure requiring high flow oxygen. He was also found to have a thrombus extending from the SMV and the portal splenic confluence. Has been placed on anticoagulation. GI consulted early on but they signed off. Surgery also consulted due to necrotizing pancreatitis   Assessment & Plan:   Principal Problem:   Pancreatitis, alcoholic, acute Active Problems:   Malnutrition of moderate degree   Hemoptysis   Acute hypoxemic respiratory failure (HCC)  Acute necrotizing pancreatitis, EtOH induced -imaging on admission showed acute pancreatitis with findings suggestive of necrotizing pancreatitis.  With no abscess or pseudocyst were seen.  GI consulted, now signed off.  General surgery also consulted, no indication for surgery and they signed off as well.  Continue multimodal pain management and supportive care.  Tolerating dysphagia 3 diet.  Will continue that.  Continue tube feeds until nutritional needs met through oral intake.  Calorie count continues.  Nutrition on board.   Acute hypoxic respiratory failure and sepsis secondary to aspiration pneumonia, sepsis not present on admission: - Chest x-ray does not show any significant ARDS, however CT chest completed on 04/25/2024 shows extensive consolidation in the lung bases.  Patient developed fever of 102 again at 11:30 PM on 04/27/2024.  Has been restarted on Rocephin  since 04/26/2024.  He was afebrile for last 3 days until late evening of 05/01/2023 when he developed fever of 101.6 with latest  temperature spike today around 8 AM of 101.3.  This is despite of him receiving Rocephin  and leukocytosis improving.  Blood culture from 04/26/2024 negative.  Will repeat blood cultures now.  Monitor fever curve on current antibiotics.  Patient also has had 11 loose bowel movements in last 24 hours.  Prior to this, patient's number of bowel movements were 3-5 but now with fever, raises concern for possible C. difficile.  Discussed with ID on-call/Dr. Eben and we will be testing for C. difficile.  Patient is comfortable on room air now.   SMV portal splenic thrombus/hemoptysis- This is likely due to pancreatitis.  Thrombus is nonocclusive.  Heparin  was held due to hemoptysis on early morning of 04/27/2024 but resumed later on the same day.  He was monitored closely, did not have any further hemoptysis so he was transition to DOAC on 04/30/2024.   Essential hypertension-pretty hypertensive on admission, has been placed on amlodipine , Coreg .  Blood pressure slightly elevated but will not make any changes to the medications due to new fever and risk of hypotension.   EtOH use-no withdrawals noted, status post Librium  taper, out of the window for withdrawal.   Hypokalemia-replaced/resolved.  Mild acute hyponatremia: 131 today.  Monitor for now.   Chronic diastolic CHF-now being diuresed with IV furosemide .   RA-does not appear to be on any medications at home   Anemia of critical illness-hemoglobin fairly stable.  Transfuse if less than 7.   Thrombocytopenia-in the setting of EtOH use however this has resolved.   DM2, with hyperglycemia-currently on 35 units of Lantus  and SSI, still slightly hyperglycemic, increase Lantus  to 40 units.   DVT prophylaxis: Eliquis    Code Status:  Full Code  Family Communication: Family member present at bedside.  Plan of care discussed with patient in length and he/she verbalized understanding and agreed with it.  Status is: Inpatient Remains inpatient appropriate  because: Now has developed fever and worsening diarrhea.  Further workup in progress.   Estimated body mass index is 24.14 kg/m as calculated from the following:   Height as of this encounter: 5' 7 (1.702 m).   Weight as of this encounter: 69.9 kg.    Nutritional Assessment: Body mass index is 24.14 kg/m.SABRA Seen by dietician.  I agree with the assessment and plan as outlined below: Nutrition Status: Nutrition Problem: Moderate Malnutrition Etiology: chronic illness (alcohol abuse; CHF) Signs/Symptoms: mild fat depletion, moderate muscle depletion Interventions: Refer to RD note for recommendations, Tube feeding  . Skin Assessment: I have examined the patient's skin and I agree with the wound assessment as performed by the wound care RN as outlined below:    Consultants:  Critical care  Procedures:  None  Antimicrobials:  Anti-infectives (From admission, onward)    Start     Dose/Rate Route Frequency Ordered Stop   04/26/24 0845  cefTRIAXone  (ROCEPHIN ) 2 g in sodium chloride  0.9 % 100 mL IVPB        2 g 200 mL/hr over 30 Minutes Intravenous Every 24 hours 04/26/24 0750     04/20/24 1800  meropenem  (MERREM ) 1 g in sodium chloride  0.9 % 100 mL IVPB  Status:  Discontinued        1 g 200 mL/hr over 30 Minutes Intravenous Every 12 hours 04/19/24 1725 04/19/24 1751   04/19/24 1715  meropenem  (MERREM ) 1 g in sodium chloride  0.9 % 100 mL IVPB  Status:  Discontinued        1 g 200 mL/hr over 30 Minutes Intravenous Every 8 hours 04/19/24 1705 04/19/24 1725   04/19/24 1415  cefTRIAXone  (ROCEPHIN ) 2 g in sodium chloride  0.9 % 100 mL IVPB        2 g 200 mL/hr over 30 Minutes Intravenous Once 04/19/24 1404 04/19/24 1458   04/19/24 1415  metroNIDAZOLE  (FLAGYL ) IVPB 500 mg        500 mg 100 mL/hr over 60 Minutes Intravenous  Once 04/19/24 1404 04/19/24 1521   04/19/24 1345  vancomycin  (VANCOREADY) IVPB 1500 mg/300 mL  Status:  Discontinued        1,500 mg 150 mL/hr over 120 Minutes  Intravenous  Once 04/19/24 1333 04/19/24 1403   04/19/24 1330  ceFEPIme  (MAXIPIME ) 2 g in sodium chloride  0.9 % 100 mL IVPB  Status:  Discontinued        2 g 200 mL/hr over 30 Minutes Intravenous  Once 04/19/24 1326 04/19/24 1403   04/19/24 1330  metroNIDAZOLE  (FLAGYL ) IVPB 500 mg  Status:  Discontinued        500 mg 100 mL/hr over 60 Minutes Intravenous  Once 04/19/24 1326 04/19/24 1403   04/19/24 1330  vancomycin  (VANCOCIN ) IVPB 1000 mg/200 mL premix  Status:  Discontinued        1,000 mg 200 mL/hr over 60 Minutes Intravenous  Once 04/19/24 1326 04/19/24 1332         Subjective: Patient seen and examined.  Family member at the bedside.  Patient has no complaints.  Minimal abdominal pain which is improving.  Continues to have diarrhea he endorsed.  Denied any chest pain or shortness of breath.  Objective: Vitals:   05/01/24 0411 05/01/24 0500 05/01/24 0600 05/01/24 0801  BP:  ROLLEN)  147/56 (!) 168/58   Pulse:      Resp:  (!) 28 (!) 23   Temp: 98.7 F (37.1 C)   (!) 101.3 F (38.5 C)  TempSrc: Oral   Oral  SpO2:      Weight:      Height:        Intake/Output Summary (Last 24 hours) at 05/01/2024 0814 Last data filed at 05/01/2024 0412 Gross per 24 hour  Intake 2222.47 ml  Output 2425 ml  Net -202.53 ml   Filed Weights   04/28/24 0500 04/29/24 1448 04/30/24 0500  Weight: 54.1 kg 68.1 kg 69.9 kg    Examination:  General exam: Appears calm and comfortable, appears sick Respiratory system: Clear to auscultation. Respiratory effort normal. Cardiovascular system: S1 & S2 heard, RRR. No JVD, murmurs, rubs, gallops or clicks. No pedal edema. Gastrointestinal system: Abdomen is nondistended, soft and nontender. No organomegaly or masses felt. Normal bowel sounds heard. Central nervous system: Alert and oriented. No focal neurological deficits. Extremities: Symmetric 5 x 5 power. Skin: No rashes, lesions or ulcers.  Psychiatry: Judgement and insight appear normal. Mood & affect  appropriate.   Data Reviewed: I have personally reviewed following labs and imaging studies  CBC: Recent Labs  Lab 04/27/24 0008 04/28/24 0102 04/29/24 0534 04/30/24 0623 05/01/24 0218  WBC 21.2* 24.8* 20.8* 24.5* 21.1*  HGB 9.2* 8.7* 8.5* 9.0* 8.3*  HCT 27.5* 25.5* 25.4* 26.4* 24.9*  MCV 100.7* 98.5 98.4 98.5 97.3  PLT 223 278 342 446* 442*   Basic Metabolic Panel: Recent Labs  Lab 04/24/24 1712 04/25/24 0513 04/25/24 0513 04/26/24 0340 04/27/24 0008 04/28/24 0102 04/30/24 0623 05/01/24 0218  NA  --  139   < > 131* 130* 128* 130* 131*  K  --  3.9   < > 3.8 3.5 3.8 3.6 3.3*  CL  --  105   < > 96* 92* 90* 89* 93*  CO2  --  24   < > 24 28 27 27 25   GLUCOSE  --  302*   < > 312* 155* 154* 168* 227*  BUN  --  17   < > 16 17 17 16 16   CREATININE  --  0.78   < > 0.80 0.72 0.81 0.85 0.79  CALCIUM   --  8.8*   < > 8.7* 8.5* 8.3* 9.0 9.1  MG 2.0 1.9  --  1.8  --   --   --   --   PHOS 2.8 3.7  --  3.4  --   --   --   --    < > = values in this interval not displayed.   GFR: Estimated Creatinine Clearance: 83.8 mL/min (by C-G formula based on SCr of 0.79 mg/dL). Liver Function Tests: Recent Labs  Lab 04/26/24 0340 04/27/24 0008 04/28/24 0102 04/30/24 0623 05/01/24 0218  AST 77* 51* 47* 46* 29  ALT 51* 45* 40 43 32  ALKPHOS 112 101 99 109 99  BILITOT 1.0 0.7 0.9 0.7 0.6  PROT 6.5 6.3* 6.5 7.1 6.7  ALBUMIN  2.5* 2.3* 2.2* 2.3* 2.2*   Recent Labs  Lab 04/26/24 0340  LIPASE 99*   No results for input(s): AMMONIA in the last 168 hours. Coagulation Profile: No results for input(s): INR, PROTIME in the last 168 hours. Cardiac Enzymes: No results for input(s): CKTOTAL, CKMB, CKMBINDEX, TROPONINI in the last 168 hours. BNP (last 3 results) No results for input(s): PROBNP in the last 8760 hours. HbA1C: No  results for input(s): HGBA1C in the last 72 hours. CBG: Recent Labs  Lab 04/30/24 1530 04/30/24 2026 04/30/24 2359 05/01/24 0404  05/01/24 0723  GLUCAP 330* 287* 282* 210* 178*   Lipid Profile: No results for input(s): CHOL, HDL, LDLCALC, TRIG, CHOLHDL, LDLDIRECT in the last 72 hours. Thyroid Function Tests: No results for input(s): TSH, T4TOTAL, FREET4, T3FREE, THYROIDAB in the last 72 hours. Anemia Panel: No results for input(s): VITAMINB12, FOLATE, FERRITIN, TIBC, IRON, RETICCTPCT in the last 72 hours. Sepsis Labs: No results for input(s): PROCALCITON, LATICACIDVEN in the last 168 hours.  Recent Results (from the past 240 hours)  Culture, blood (Routine X 2) w Reflex to ID Panel     Status: None   Collection Time: 04/26/24  5:41 AM   Specimen: BLOOD  Result Value Ref Range Status   Specimen Description   Final    BLOOD BLOOD RIGHT ARM Performed at [facility], [address] [place], [place] [address]    Special Requests   Final    BOTTLES DRAWN AEROBIC ONLY Blood Culture adequate volume Performed at [facility], [address] [place], [place] [address]    Culture   Final    NO GROWTH 5 DAYS Performed at [facility], [address], [place], [place] [address]    Report Status 05/01/2024 FINAL  Final  Culture, blood (Routine X 2) w Reflex to ID Panel     Status: None   Collection Time: 04/26/24  5:41 AM   Specimen: BLOOD  Result Value Ref Range Status   Specimen Description   Final    BLOOD BLOOD RIGHT ARM Performed at [facility], [address] [place], [place] [address]    Special Requests   Final    BOTTLES DRAWN AEROBIC ONLY Blood Culture adequate volume Performed at [facility], [address] [place], [place] [address]    Culture   Final    NO GROWTH 5 DAYS Performed at [facility], [address], [place], [place] [address]    Report Status 05/01/2024 FINAL  Final     Radiology Studies: No results found.  Scheduled Meds:  (feeding supplement)  PROSource Plus  30 mL Oral BID BM   amLODipine   10 mg Oral Daily   apixaban   5 mg Oral BID   carvedilol   12.5 mg Oral BID WC   Chlorhexidine  Gluconate Cloth  6 each Topical Daily   feeding supplement  1 Container Oral TID BM   feeding supplement (PROSource TF20)  60 mL Per Tube Daily   folic acid   1 mg Intravenous Daily   furosemide   20 mg Intravenous Q12H   insulin  aspart  0-15 Units Subcutaneous Q4H   insulin  aspart  6 Units Subcutaneous Q4H   insulin  glargine-yfgn  35 Units Subcutaneous Daily   multivitamin  15 mL Per Tube Daily   nicotine   21 mg Transdermal Daily   thiamine   100 mg Oral Daily   Or   thiamine   100 mg Intravenous Daily   Continuous Infusions:  cefTRIAXone  (ROCEPHIN )  IV Stopped (04/30/24 0851)   feeding supplement (OSMOLITE 1.5 CAL) 1,000 mL (05/01/24 0721)     LOS: 12 days   Fredia Skeeter, MD [facility]  05/01/2024, 8:14 AM   *Please note that this is a verbal dictation therefore any spelling or grammatical errors are due to the Dragon Medical One system interpretation.  Please page via Amion and do  not message via secure chat for urgent patient care matters. Secure chat can be used for non urgent patient care matters.  How to contact the [facility] Attending or Consulting provider 7A - 7P or covering provider during after hours 7P -7A, for this patient?  Check the care team in [facility] and look for a) attending/consulting [facility] provider listed and b) the [facility] team listed. Page or secure chat 7A-7P. Log into [email or web address] and use [place]'s universal password to access. If you do not have the password, please contact the hospital operator. Locate the [facility] provider you are looking for under [facility] and page to a number that you can be directly reached. If you still have difficulty reaching the provider, please page the [facility] (Director on Call) for the Hospitalists listed on amion for assistance.

## 2024-05-01 NOTE — Plan of Care (Signed)
  Problem: Education: Goal: Knowledge of General Education information will improve Description: Including pain rating scale, medication(s)/side effects and non-pharmacologic comfort measures Outcome: Progressing   Problem: Elimination: Goal: Will not experience complications related to urinary retention Outcome: Progressing   Problem: Clinical Measurements: Goal: Will remain free from infection Outcome: Not Progressing   Problem: Nutrition: Goal: Adequate nutrition will be maintained Outcome: Not Progressing   Problem: Elimination: Goal: Will not experience complications related to bowel motility Outcome: Not Progressing   Problem: Pain Managment: Goal: General experience of comfort will improve and/or be controlled Outcome: Not Progressing

## 2024-05-02 DIAGNOSIS — K8521 Alcohol induced acute pancreatitis with uninfected necrosis: Secondary | ICD-10-CM | POA: Diagnosis not present

## 2024-05-02 LAB — GLUCOSE, CAPILLARY
Glucose-Capillary: 162 mg/dL — ABNORMAL HIGH (ref 70–99)
Glucose-Capillary: 194 mg/dL — ABNORMAL HIGH (ref 70–99)
Glucose-Capillary: 197 mg/dL — ABNORMAL HIGH (ref 70–99)
Glucose-Capillary: 208 mg/dL — ABNORMAL HIGH (ref 70–99)
Glucose-Capillary: 211 mg/dL — ABNORMAL HIGH (ref 70–99)
Glucose-Capillary: 219 mg/dL — ABNORMAL HIGH (ref 70–99)
Glucose-Capillary: 233 mg/dL — ABNORMAL HIGH (ref 70–99)

## 2024-05-02 LAB — CBC
HCT: 25.1 % — ABNORMAL LOW (ref 39.0–52.0)
Hemoglobin: 8.3 g/dL — ABNORMAL LOW (ref 13.0–17.0)
MCH: 32.2 pg (ref 26.0–34.0)
MCHC: 33.1 g/dL (ref 30.0–36.0)
MCV: 97.3 fL (ref 80.0–100.0)
Platelets: 508 K/uL — ABNORMAL HIGH (ref 150–400)
RBC: 2.58 MIL/uL — ABNORMAL LOW (ref 4.22–5.81)
RDW: 12.1 % (ref 11.5–15.5)
WBC: 19.9 K/uL — ABNORMAL HIGH (ref 4.0–10.5)
nRBC: 0 % (ref 0.0–0.2)

## 2024-05-02 LAB — BASIC METABOLIC PANEL WITH GFR
Anion gap: 16 — ABNORMAL HIGH (ref 5–15)
BUN: 13 mg/dL (ref 8–23)
CO2: 23 mmol/L (ref 22–32)
Calcium: 9 mg/dL (ref 8.9–10.3)
Chloride: 92 mmol/L — ABNORMAL LOW (ref 98–111)
Creatinine, Ser: 0.72 mg/dL (ref 0.61–1.24)
GFR, Estimated: >60 mL/min (ref >60)
Glucose, Bld: 213 mg/dL — ABNORMAL HIGH (ref 70–99)
Potassium: 4.9 mmol/L (ref 3.5–5.1)
Sodium: 131 mmol/L — ABNORMAL LOW (ref 135–145)

## 2024-05-02 MED ORDER — TAMSULOSIN HCL 0.4 MG PO CAPS
0.8000 mg | ORAL_CAPSULE | Freq: Every day | ORAL | Status: DC
  Administered 2024-05-02 – 2024-05-06 (×5): 0.8 mg via ORAL
  Filled 2024-05-02 (×5): qty 2

## 2024-05-02 MED ORDER — INSULIN GLARGINE-YFGN 100 UNIT/ML ~~LOC~~ SOLN
45.0000 [IU] | Freq: Every day | SUBCUTANEOUS | Status: DC
  Administered 2024-05-02: 45 [IU] via SUBCUTANEOUS
  Filled 2024-05-02 (×2): qty 0.45

## 2024-05-02 MED ORDER — FUROSEMIDE 40 MG PO TABS
40.0000 mg | ORAL_TABLET | Freq: Every day | ORAL | Status: DC
  Administered 2024-05-03 – 2024-05-06 (×4): 40 mg via ORAL
  Filled 2024-05-02 (×4): qty 1

## 2024-05-02 MED ORDER — SPIRONOLACTONE 25 MG PO TABS
25.0000 mg | ORAL_TABLET | Freq: Every day | ORAL | Status: DC
  Administered 2024-05-02 – 2024-05-06 (×5): 25 mg via ORAL
  Filled 2024-05-02 (×4): qty 1

## 2024-05-02 MED ORDER — METOPROLOL TARTRATE 25 MG PO TABS: 50.0000 mg | ORAL_TABLET | Freq: Two times a day (BID) | ORAL | Status: DC

## 2024-05-02 NOTE — TOC Progression Note (Signed)
 Transition of Care [facility]) - Progression Note    Patient Details  Name: [name] MRN: [id number] Date of Birth: 05/06/1956  [facility] CM/SW Contact  Sonda Manuella Quill, RN Phone Number: 05/02/2024, 4:56 PM  Clinical Narrative:    Pt remains in ICU and not medically ready for d/c; TOC is following.   Expected Discharge Plan: Home/Self Care Barriers to Discharge: Continued Medical Work up  Expected Discharge Plan and Services In-house Referral: Clinical Social Work Discharge Planning Services: CM Consult Post Acute Care Choice: NA Living arrangements for the past 2 months: Single Family Home                 DME Arranged: N/A DME Agency: NA       HH Arranged: NA HH Agency: NA         Social Determinants of Health (SDOH) Interventions SDOH Screenings   Food Insecurity: Patient Unable To Answer (04/20/2024)  Housing: Patient Unable To Answer (04/20/2024)  Transportation Needs: Patient Unable To Answer (04/20/2024)  Utilities: Patient Unable To Answer (04/20/2024)  Alcohol Screen: Low Risk  (07/10/2023)  Depression (PHQ2-9): High Risk (04/02/2024)  Financial Resource Strain: Low Risk  (07/10/2023)  Physical Activity: Sufficiently Active (07/10/2023)  Social Connections: Patient Unable To Answer (04/20/2024)  Stress: No Stress Concern Present (07/10/2023)  Tobacco Use: High Risk (04/19/2024)  Health Literacy: Adequate Health Literacy (07/10/2023)    Readmission Risk Interventions    04/21/2024   11:05 AM  Readmission Risk Prevention Plan  Transportation Screening Complete  PCP or Specialist Appt within 3-5 Days Complete  HRI or Home Care Consult Complete  Social Work Consult for Recovery Care Planning/Counseling Complete  Palliative Care Screening Not Applicable  Medication Review [employer]) Complete

## 2024-05-02 NOTE — Plan of Care (Signed)
  Problem: Clinical Measurements: Goal: Respiratory complications will improve Outcome: Progressing   Problem: Activity: Goal: Risk for activity intolerance will decrease Outcome: Progressing   Problem: Nutrition: Goal: Adequate nutrition will be maintained Outcome: Progressing   Problem: Skin Integrity: Goal: Risk for impaired skin integrity will decrease Outcome: Progressing   

## 2024-05-02 NOTE — Progress Notes (Signed)
 PROGRESS NOTE    [name]  FMW:[id number] DOB: 1956/02/27 DOA: 04/19/2024 PCP: [name], NP   Brief Narrative:  68 year old male with history of DM2, HTN, HLD, rheumatoid arthritis, prior CVA, tobacco and alcohol use comes into the hospital and is admitted on 6/23 with abdominal pain, found to have necrotizing pancreatitis. Hospital course complicated by persistent abdominal pain requiring NG tube placement for feeding, also hypoxic respiratory failure requiring high flow oxygen. He was also found to have a thrombus extending from the SMV and the portal splenic confluence. Has been placed on anticoagulation. GI consulted early on but they signed off. Surgery also consulted due to necrotizing pancreatitis   Assessment & Plan:   Principal Problem:   Pancreatitis, alcoholic, acute Active Problems:   Malnutrition of moderate degree   Hemoptysis   Acute hypoxemic respiratory failure (HCC)  Acute necrotizing pancreatitis, EtOH induced -imaging on admission showed acute pancreatitis with findings suggestive of necrotizing pancreatitis.  With no abscess or pseudocyst were seen.  GI consulted, now signed off.  General surgery also consulted, no indication for surgery and they signed off as well.  Continue multimodal pain management and supportive care.  Tolerating dysphagia 3 diet.  Will advance to diabetic diet today.   Calorie count continues.  Nutrition on board.  Will defer to them about the timing of stopping tube feeds.   Acute hypoxic respiratory failure and sepsis secondary to aspiration pneumonia, sepsis not present on admission: - Chest x-ray does not show any significant ARDS, however CT chest completed on 04/25/2024 shows extensive consolidation in the lung bases.  Patient developed fever of 102 again at 11:30 PM on 04/27/2024.  Has been restarted on Rocephin  since 04/26/2024.  He was afebrile for 3 days until late evening of 05/01/2023 when he developed fever of 101.6, This is  despite of him receiving Rocephin  and leukocytosis improving.  Blood culture from 04/26/2024 negative.  Was diagnosed with C. difficile, details below for that.  However now that he has received almost 7 days of Rocephin  for pneumonia and repeat chest x-ray on 05/01/2024 shows unchanged left pleural effusion and possible consolidation which was also unchanged, we will stop Rocephin  in the setting of C. difficile.  He has been weaned to room air since 04/30/2024.  C. difficile colitis: Started having fever on the evening of 04/30/2024 which persisted and had worsening diarrhea on 05/01/2024 raising concern for C. difficile.  Unfortunately, he was tested positive for that.  Started on vancomycin .  He has had 7 bowel movements in last 24 hours.  Last temperature spike of 100.6 at midnight 05/02/2024.   SMV portal splenic thrombus/hemoptysis- This is likely due to pancreatitis.  Thrombus is nonocclusive.  Heparin  was held due to hemoptysis on early morning of 04/27/2024 but resumed later on the same day.  He was monitored closely, did not have any further hemoptysis so he was transition to DOAC on 04/30/2024.   Essential hypertension-pretty hypertensive on admission, has been placed on amlodipine , Coreg .  Blood pressure slightly elevated but will not make any changes to the medications due to new fever and risk of hypotension.   EtOH use-no withdrawals noted, status post Librium  taper, out of the window for withdrawal.   Hypokalemia-replaced/resolved.  Mild acute hyponatremia: 131 today.  Monitor for now.   Fatty liver with early liver cirrhosis/chronic diastolic CHF-now being diuresed with IV furosemide .  Does not appear to be much volume overloaded.  Will stop IV Lasix  20 mg twice daily and  start on Lasix  40 mg p.o. daily starting today.  Since he also appears to have fatty liver with early cirrhosis, I will also start him on low-dose Aldactone .  Resume beta-blocker.  RA-does not appear to be on any medications at  home   Anemia of critical illness-hemoglobin fairly stable.  Transfuse if less than 7.   Thrombocytopenia-in the setting of EtOH use however this has resolved.   DM2, with hyperglycemia-currently on 40 units of Lantus  and SSI, still slightly hyperglycemic, increase Lantus  to 45 units.   DVT prophylaxis: Eliquis    Code Status: Full Code  Family Communication: None present at bedside.  Plan of care discussed with patient in length and he/she verbalized understanding and agreed with it.  Status is: Inpatient Remains inpatient appropriate because: Still with diarrhea and persistent fever.   Estimated body mass index is 23.76 kg/m as calculated from the following:   Height as of this encounter: 5' 7 (1.702 m).   Weight as of this encounter: 68.8 kg.    Nutritional Assessment: Body mass index is 23.76 kg/m.SABRA Seen by dietician.  I agree with the assessment and plan as outlined below: Nutrition Status: Nutrition Problem: Moderate Malnutrition Etiology: chronic illness (alcohol abuse; CHF) Signs/Symptoms: mild fat depletion, moderate muscle depletion Interventions: Refer to RD note for recommendations, Tube feeding  . Skin Assessment: I have examined the patient's skin and I agree with the wound assessment as performed by the wound care RN as outlined below:    Consultants:  Critical care  Procedures:  None  Antimicrobials:  Anti-infectives (From admission, onward)    Start     Dose/Rate Route Frequency Ordered Stop   05/01/24 1800  vancomycin  (VANCOCIN ) capsule 125 mg        125 mg Oral 4 times daily 05/01/24 1548 05/11/24 1759   04/26/24 0845  cefTRIAXone  (ROCEPHIN ) 2 g in sodium chloride  0.9 % 100 mL IVPB        2 g 200 mL/hr over 30 Minutes Intravenous Every 24 hours 04/26/24 0750     04/20/24 1800  meropenem  (MERREM ) 1 g in sodium chloride  0.9 % 100 mL IVPB  Status:  Discontinued        1 g 200 mL/hr over 30 Minutes Intravenous Every 12 hours 04/19/24 1725 04/19/24  1751   04/19/24 1715  meropenem  (MERREM ) 1 g in sodium chloride  0.9 % 100 mL IVPB  Status:  Discontinued        1 g 200 mL/hr over 30 Minutes Intravenous Every 8 hours 04/19/24 1705 04/19/24 1725   04/19/24 1415  cefTRIAXone  (ROCEPHIN ) 2 g in sodium chloride  0.9 % 100 mL IVPB        2 g 200 mL/hr over 30 Minutes Intravenous Once 04/19/24 1404 04/19/24 1458   04/19/24 1415  metroNIDAZOLE  (FLAGYL ) IVPB 500 mg        500 mg 100 mL/hr over 60 Minutes Intravenous  Once 04/19/24 1404 04/19/24 1521   04/19/24 1345  vancomycin  (VANCOREADY) IVPB 1500 mg/300 mL  Status:  Discontinued        1,500 mg 150 mL/hr over 120 Minutes Intravenous  Once 04/19/24 1333 04/19/24 1403   04/19/24 1330  ceFEPIme  (MAXIPIME ) 2 g in sodium chloride  0.9 % 100 mL IVPB  Status:  Discontinued        2 g 200 mL/hr over 30 Minutes Intravenous  Once 04/19/24 1326 04/19/24 1403   04/19/24 1330  metroNIDAZOLE  (FLAGYL ) IVPB 500 mg  Status:  Discontinued  500 mg 100 mL/hr over 60 Minutes Intravenous  Once 04/19/24 1326 04/19/24 1403   04/19/24 1330  vancomycin  (VANCOCIN ) IVPB 1000 mg/200 mL premix  Status:  Discontinued        1,000 mg 200 mL/hr over 60 Minutes Intravenous  Once 04/19/24 1326 04/19/24 1332         Subjective: Patient seen and examined.  He still complains of abdominal pain but that is improving and now is intermittent.  No other complaint.  Denies shortness of breath or chest pain.  Continues to have diarrhea.  Objective: Vitals:   05/02/24 0600 05/02/24 0700 05/02/24 0800 05/02/24 0843  BP: (!) 153/45  (!) 144/55 (!) 144/55  Pulse:    79  Resp: (!) 28 (!) 26 (!) 25   Temp:      TempSrc:      SpO2:   95%   Weight:      Height:        Intake/Output Summary (Last 24 hours) at 05/02/2024 0853 Last data filed at 05/02/2024 0600 Gross per 24 hour  Intake 1999.01 ml  Output 2050 ml  Net -50.99 ml   Filed Weights   04/29/24 1448 04/30/24 0500 05/02/24 0500  Weight: 68.1 kg 69.9 kg 68.8 kg     Examination:  General exam: Appears calm and comfortable, chronically sick appearing Respiratory system: Clear to auscultation. Respiratory effort normal. Cardiovascular system: S1 & S2 heard, RRR. No JVD, murmurs, rubs, gallops or clicks. No pedal edema. Gastrointestinal system: Abdomen is nondistended, soft and nontender. No organomegaly or masses felt. Normal bowel sounds heard. Central nervous system: Alert and oriented. No focal neurological deficits. Extremities: Symmetric 5 x 5 power. Skin: No rashes, lesions or ulcers.  Psychiatry: Judgement and insight appear normal. Mood & affect appropriate.   Data Reviewed: I have personally reviewed following labs and imaging studies  CBC: Recent Labs  Lab 04/28/24 0102 04/29/24 0534 04/30/24 0623 05/01/24 0218 05/02/24 0241  WBC 24.8* 20.8* 24.5* 21.1* 19.9*  HGB 8.7* 8.5* 9.0* 8.3* 8.3*  HCT 25.5* 25.4* 26.4* 24.9* 25.1*  MCV 98.5 98.4 98.5 97.3 97.3  PLT 278 342 446* 442* 508*   Basic Metabolic Panel: Recent Labs  Lab 04/26/24 0340 04/27/24 0008 04/28/24 0102 04/30/24 0623 05/01/24 0218  NA 131* 130* 128* 130* 131*  K 3.8 3.5 3.8 3.6 3.3*  CL 96* 92* 90* 89* 93*  CO2 24 28 27 27 25   GLUCOSE 312* 155* 154* 168* 227*  BUN 16 17 17 16 16   CREATININE 0.80 0.72 0.81 0.85 0.79  CALCIUM  8.7* 8.5* 8.3* 9.0 9.1  MG 1.8  --   --   --   --   PHOS 3.4  --   --   --   --    GFR: Estimated Creatinine Clearance: 83.8 mL/min (by C-G formula based on SCr of 0.79 mg/dL). Liver Function Tests: Recent Labs  Lab 04/26/24 0340 04/27/24 0008 04/28/24 0102 04/30/24 0623 05/01/24 0218  AST 77* 51* 47* 46* 29  ALT 51* 45* 40 43 32  ALKPHOS 112 101 99 109 99  BILITOT 1.0 0.7 0.9 0.7 0.6  PROT 6.5 6.3* 6.5 7.1 6.7  ALBUMIN  2.5* 2.3* 2.2* 2.3* 2.2*   Recent Labs  Lab 04/26/24 0340  LIPASE 99*   No results for input(s): AMMONIA in the last 168 hours. Coagulation Profile: No results for input(s): INR, PROTIME in the  last 168 hours. Cardiac Enzymes: No results for input(s): CKTOTAL, CKMB, CKMBINDEX, TROPONINI in the  last 168 hours. BNP (last 3 results) No results for input(s): PROBNP in the last 8760 hours. HbA1C: No results for input(s): HGBA1C in the last 72 hours. CBG: Recent Labs  Lab 05/01/24 1605 05/01/24 1958 05/02/24 0045 05/02/24 0457 05/02/24 0802  GLUCAP 208* 193* 194* 219* 197*   Lipid Profile: No results for input(s): CHOL, HDL, LDLCALC, TRIG, CHOLHDL, LDLDIRECT in the last 72 hours. Thyroid Function Tests: No results for input(s): TSH, T4TOTAL, FREET4, T3FREE, THYROIDAB in the last 72 hours. Anemia Panel: No results for input(s): VITAMINB12, FOLATE, FERRITIN, TIBC, IRON, RETICCTPCT in the last 72 hours. Sepsis Labs: No results for input(s): PROCALCITON, LATICACIDVEN in the last 168 hours.  Recent Results (from the past 240 hours)  Culture, blood (Routine X 2) w Reflex to ID Panel     Status: None   Collection Time: 04/26/24  5:41 AM   Specimen: BLOOD  Result Value Ref Range Status   Specimen Description   Final    BLOOD BLOOD RIGHT ARM Performed at [facility], [address], [place], [place] [address]    Special Requests   Final    BOTTLES DRAWN AEROBIC ONLY Blood Culture adequate volume Performed at [facility], [address], [place], [place] [address]    Culture   Final    NO GROWTH 5 DAYS Performed at [facility], [address] [place], [place] [address]    Report Status 05/01/2024 FINAL  Final  Culture, blood (Routine X 2) w Reflex to ID Panel     Status: None   Collection Time: 04/26/24  5:41 AM   Specimen: BLOOD  Result Value Ref Range Status   Specimen Description   Final    BLOOD BLOOD RIGHT ARM Performed at [facility], [address] [place], [place] [address]    Special Requests   Final    BOTTLES DRAWN AEROBIC ONLY Blood Culture  adequate volume Performed at [facility], [address], [place], [place] [address]    Culture   Final    NO GROWTH 5 DAYS Performed at [facility], [address], [place], [place] [address]    Report Status 05/01/2024 FINAL  Final  Culture, blood (Routine X 2) w Reflex to ID Panel     Status: None (Preliminary result)   Collection Time: 05/01/24  9:01 AM   Specimen: BLOOD  Result Value Ref Range Status   Specimen Description   Final    BLOOD BLOOD RIGHT ARM Performed at [facility], [address] [place], [place] [address]    Special Requests   Final    BOTTLES DRAWN AEROBIC AND ANAEROBIC Blood Culture results may not be optimal due to an inadequate volume of blood received in culture bottles Performed at [facility], [address] [place], [place] [address]    Culture   Final    NO GROWTH < 24 HOURS Performed at [facility], [address] [place], [place] [address]    Report Status PENDING  Incomplete  Culture, blood (Routine X 2) w Reflex to ID Panel     Status: None (Preliminary result)   Collection Time: 05/01/24  9:01 AM   Specimen: BLOOD  Result Value Ref Range Status   Specimen Description   Final    BLOOD BLOOD RIGHT HAND Performed at [facility], [address], [place], [place] [address]    Special  Requests   Final    BOTTLES DRAWN AEROBIC AND ANAEROBIC Blood Culture results may not be optimal due to an inadequate volume of blood received in culture bottles Performed at [facility], [address] [place], [place] [address]    Culture   Final    NO GROWTH < 24 HOURS Performed at [facility], [address], [place], [place] [address]    Report Status PENDING  Incomplete  C Difficile Quick Screen (NO PCR Reflex)     Status: Abnormal   Collection Time: 05/01/24  3:04 PM   Specimen: STOOL  Result Value Ref Range Status   C Diff antigen  POSITIVE (A) NEGATIVE Final   C Diff toxin POSITIVE (A) NEGATIVE Final   C Diff interpretation Toxin producing C. difficile detected.  Final    Comment: CRITICAL RESULT CALLED TO, READ BACK BY AND VERIFIED WITH: JOESEPH LABOR RN AT 1543 ON 05/01/2024 BY PRUDY POUR Performed at [facility], [address] [place], [place] [address]      Radiology Studies: DG CHEST PORT 1 VIEW Result Date: 05/01/2024 CLINICAL DATA:  Recent admission for pancreatitis. EXAM: PORTABLE CHEST 1 VIEW COMPARISON:  04/25/2024 FINDINGS: Feeding tube is identified with tip in the left upper quadrant of the abdomen in the expected location of the duodenal jejunal junction. Normal heart size. Unchanged left pleural effusion with overlying atelectasis versus consolidation. Persistent right middle lobe atelectasis with improved aeration to the right lower lobe. Visualized osseous structures are unremarkable. IMPRESSION: 1. Unchanged left pleural effusion with overlying atelectasis versus consolidation. 2. Persistent right middle lobe atelectasis with improved aeration to the right lower lobe. Electronically Signed   By: Waddell Calk M.D.   On: 05/01/2024 12:41    Scheduled Meds:  (feeding supplement) PROSource Plus  30 mL Oral BID BM   amLODipine   10 mg Oral Daily   apixaban   5 mg Oral BID   carvedilol   12.5 mg Oral BID WC   Chlorhexidine  Gluconate Cloth  6 each Topical Daily   feeding supplement  1 Container Oral TID BM   feeding supplement (PROSource TF20)  60 mL Per Tube Daily   folic acid   1 mg Intravenous Daily   furosemide   20 mg Intravenous Q12H   insulin  aspart  0-15 Units Subcutaneous Q4H   insulin  aspart  6 Units Subcutaneous Q4H   insulin  glargine-yfgn  40 Units Subcutaneous Daily   multivitamin  15 mL Per Tube Daily   nicotine   21 mg Transdermal Daily   thiamine   100 mg Oral Daily   Or   thiamine   100 mg Intravenous Daily   vancomycin   125 mg Oral QID   Continuous Infusions:   cefTRIAXone  (ROCEPHIN )  IV 2 g (05/02/24 0843)   feeding supplement (OSMOLITE 1.5 CAL) 55 mL/hr at 05/02/24 0600     LOS: 13 days   Fredia Skeeter, MD [facility]  05/02/2024, 8:53 AM   *Please note that this is a verbal dictation therefore any spelling or grammatical errors are due to the Dragon Medical One system interpretation.  Please page via Amion and do not message via secure chat for urgent patient care matters. Secure chat can be used for non urgent patient care matters.  How to contact the [facility] Attending or Consulting provider 7A - 7P or covering provider during after hours 7P -7A, for this patient?  Check the care team in [facility] and look for a) attending/consulting [facility] provider listed and  b) the [facility] team listed. Page or secure chat 7A-7P. Log into [email or web address] and use [place]'s universal password to access. If you do not have the password, please contact the hospital operator. Locate the [facility] provider you are looking for under [facility] and page to a number that you can be directly reached. If you still have difficulty reaching the provider, please page the [facility] (Director on Call) for the Hospitalists listed on amion for assistance.

## 2024-05-03 ENCOUNTER — Telehealth (HOSPITAL_COMMUNITY): Payer: Self-pay | Admitting: Pharmacy Technician

## 2024-05-03 ENCOUNTER — Other Ambulatory Visit (HOSPITAL_COMMUNITY): Payer: Self-pay

## 2024-05-03 DIAGNOSIS — K8521 Alcohol induced acute pancreatitis with uninfected necrosis: Secondary | ICD-10-CM | POA: Diagnosis not present

## 2024-05-03 LAB — GLUCOSE, CAPILLARY
Glucose-Capillary: 175 mg/dL — ABNORMAL HIGH (ref 70–99)
Glucose-Capillary: 175 mg/dL — ABNORMAL HIGH (ref 70–99)
Glucose-Capillary: 176 mg/dL — ABNORMAL HIGH (ref 70–99)
Glucose-Capillary: 183 mg/dL — ABNORMAL HIGH (ref 70–99)
Glucose-Capillary: 268 mg/dL — ABNORMAL HIGH (ref 70–99)
Glucose-Capillary: 271 mg/dL — ABNORMAL HIGH (ref 70–99)
Glucose-Capillary: 326 mg/dL — ABNORMAL HIGH (ref 70–99)

## 2024-05-03 LAB — CBC
HCT: 24 % — ABNORMAL LOW (ref 39.0–52.0)
Hemoglobin: 8.1 g/dL — ABNORMAL LOW (ref 13.0–17.0)
MCH: 33.1 pg (ref 26.0–34.0)
MCHC: 33.8 g/dL (ref 30.0–36.0)
MCV: 98 fL (ref 80.0–100.0)
Platelets: 560 K/uL — ABNORMAL HIGH (ref 150–400)
RBC: 2.45 MIL/uL — ABNORMAL LOW (ref 4.22–5.81)
RDW: 12 % (ref 11.5–15.5)
WBC: 17.8 K/uL — ABNORMAL HIGH (ref 4.0–10.5)
nRBC: 0.1 % (ref 0.0–0.2)

## 2024-05-03 MED ORDER — TRAZODONE HCL 50 MG PO TABS
25.0000 mg | ORAL_TABLET | Freq: Once | ORAL | Status: AC
  Administered 2024-05-04: 25 mg via ORAL
  Filled 2024-05-03: qty 1

## 2024-05-03 MED ORDER — OSMOLITE 1.5 CAL PO LIQD
660.0000 mL | ORAL | Status: DC
  Administered 2024-05-03 – 2024-05-05 (×3): 660 mL
  Filled 2024-05-03 (×3): qty 711

## 2024-05-03 MED ORDER — CARMEX CLASSIC LIP BALM EX OINT
TOPICAL_OINTMENT | CUTANEOUS | Status: DC | PRN
  Administered 2024-05-03: 2 via TOPICAL
  Filled 2024-05-03: qty 10

## 2024-05-03 MED ORDER — PROSOURCE PLUS PO LIQD
30.0000 mL | Freq: Three times a day (TID) | ORAL | Status: DC
  Filled 2024-05-03 (×2): qty 30

## 2024-05-03 MED ORDER — INSULIN GLARGINE-YFGN 100 UNIT/ML ~~LOC~~ SOLN
50.0000 [IU] | Freq: Every day | SUBCUTANEOUS | Status: DC
  Administered 2024-05-03 – 2024-05-06 (×4): 50 [IU] via SUBCUTANEOUS
  Filled 2024-05-03 (×4): qty 0.5

## 2024-05-03 NOTE — TOC Progression Note (Signed)
 Transition of Care [facility]) - Progression Note   Patient Details  Name: [name] MRN: [id number] Date of Birth: [birth year]  Transition of [facility] CM/SW Contact  Duwaine GORMAN Aran, LCSW Phone Number: 05/03/2024, 10:56 AM  Clinical Narrative: PT/OT evaluations recommended SNF. Patient is agreeable to rehab and requested the facility on [facility] [address] [facility]). Patient is expected to be medically ready in a few days. FL2 completed; PASRR received. Initial referral faxed out. TOC awaiting bed offers.  Expected Discharge Plan: Skilled Nursing Facility Barriers to Discharge: Continued Medical Work up, SNF Pending bed offer, English as a second language teacher  Expected Discharge Plan and Services In-house Referral: Clinical Social Work Discharge Planning Services: CM Consult Post Acute Care Choice: Skilled Nursing Facility Living arrangements for the past 2 months: Single Family Home            DME Arranged: N/A DME Agency: NA HH Arranged: NA HH Agency: NA  Social Determinants of Health (SDOH) Interventions SDOH Screenings   Food Insecurity: Patient Unable To Answer (04/20/2024)  Housing: Patient Unable To Answer (04/20/2024)  Transportation Needs: Patient Unable To Answer (04/20/2024)  Utilities: Patient Unable To Answer (04/20/2024)  Alcohol Screen: Low Risk  (07/10/2023)  Depression (PHQ2-9): High Risk (04/02/2024)  Financial Resource Strain: Low Risk  (07/10/2023)  Physical Activity: Sufficiently Active (07/10/2023)  Social Connections: Patient Unable To Answer (04/20/2024)  Stress: No Stress Concern Present (07/10/2023)  Tobacco Use: High Risk (04/19/2024)  Health Literacy: Adequate Health Literacy (07/10/2023)   Readmission Risk Interventions    04/21/2024   11:05 AM  Readmission Risk Prevention Plan  Transportation Screening Complete  PCP or Specialist Appt within 3-5 Days Complete  HRI or Home Care Consult Complete  Social Work Consult for Recovery Care Planning/Counseling  Complete  Palliative Care Screening Not Applicable  Medication Review Oceanographer) Complete

## 2024-05-03 NOTE — Progress Notes (Signed)
 PROGRESS NOTE    [name]  FMW:[id number] DOB: 1956-03-21 DOA: 04/19/2024 PCP: [name], NP   Brief Narrative:  68 year old male with history of DM2, HTN, HLD, rheumatoid arthritis, prior CVA, tobacco and alcohol use comes into the hospital and is admitted on 6/23 with abdominal pain, found to have necrotizing pancreatitis. Hospital course complicated by persistent abdominal pain requiring NG tube placement for feeding, also hypoxic respiratory failure requiring high flow oxygen. He was also found to have a thrombus extending from the SMV and the portal splenic confluence. Has been placed on anticoagulation. GI consulted early on but they signed off. Surgery also consulted due to necrotizing pancreatitis   Assessment & Plan:   Principal Problem:   Pancreatitis, alcoholic, acute Active Problems:   Malnutrition of moderate degree   Hemoptysis   Acute hypoxemic respiratory failure (HCC)  Acute necrotizing pancreatitis, EtOH induced -imaging on admission showed acute pancreatitis with findings suggestive of necrotizing pancreatitis.  With no abscess or pseudocyst were seen.  GI consulted, now signed off.  General surgery also consulted, no indication for surgery and they signed off as well.  Continue multimodal pain management and supportive care.  Tolerating regular diet.  Calorie count continues, still on tube feeds.  Dietitian to decide when to stop that.   Acute hypoxic respiratory failure and sepsis secondary to aspiration pneumonia, sepsis not present on admission: - Chest x-ray does not show any significant ARDS, however CT chest completed on 04/25/2024 shows extensive consolidation in the lung bases.  Patient developed fever of 102 again at 11:30 PM on 04/27/2024.  Has been restarted on Rocephin  since 04/26/2024.  He was afebrile for 3 days until late evening of 05/01/2023 when he developed fever of 101.6, This is despite of him receiving Rocephin  and leukocytosis improving.  Blood  culture repeated on 05/01/2024 and is negative.  Was diagnosed with C. difficile, details below for that.  repeat chest x-ray on 05/01/2024 shows unchanged left pleural effusion and possible consolidation which was also unchanged, completed 7 days of Rocephin  on 05/02/2024.  He has been weaned to room air since 04/30/2024.  C. difficile colitis: Started having fever on the evening of 04/30/2024 which persisted and had worsening diarrhea on 05/01/2024 raising concern for C. difficile.  Unfortunately, he was tested positive for that.  Started on vancomycin .again he had 7-8 bowel movements in the last 24 hours. Last temperature spike at midnight 05/02/2024.   SMV portal splenic thrombus/hemoptysis- This is likely due to pancreatitis.  Thrombus is nonocclusive.  Heparin  was held due to hemoptysis on early morning of 04/27/2024 but resumed later on the same day.  He was monitored closely, did not have any further hemoptysis so he was transition to DOAC on 04/30/2024.  Hemoglobin has remained stable.   Essential hypertension-pretty hypertensive on admission, has been placed on amlodipine , Coreg .  Blood pressure slightly elevated but will not make any changes to the medications due to new fever and risk of hypotension.   EtOH use-no withdrawals noted, status post Librium  taper, out of the window for withdrawal.   Hypokalemia-replaced/resolved.  Mild acute hyponatremia: 131 today.  Monitor for now.   Fatty liver with early liver cirrhosis/chronic diastolic CHF-now being diuresed with IV furosemide .  Does not appear to be much volume overloaded.  Will stop IV Lasix  20 mg twice daily and start on Lasix  40 mg p.o. daily starting today.  Since he also appears to have fatty liver with early cirrhosis, I will also start him  on low-dose Aldactone .  Resume beta-blocker.  RA-does not appear to be on any medications at home   Anemia of critical illness-hemoglobin fairly stable.  Transfuse if less than 7.   Thrombocytopenia-in the  setting of EtOH use however this has resolved.   DM2, with hyperglycemia-currently on 40 units of Lantus  and SSI, still slightly hyperglycemic, increase Lantus  to 45 units.   DVT prophylaxis: Eliquis    Code Status: Full Code  Family Communication: None present at bedside.  Plan of care discussed with patient in length and he/she verbalized understanding and agreed with it.  Status is: Inpatient Remains inpatient appropriate because: Still with diarrhea    Estimated body mass index is 23.54 kg/m as calculated from the following:   Height as of this encounter: 5' 7 (1.702 m).   Weight as of this encounter: 68.2 kg.    Nutritional Assessment: Body mass index is 23.54 kg/m.SABRA Seen by dietician.  I agree with the assessment and plan as outlined below: Nutrition Status: Nutrition Problem: Moderate Malnutrition Etiology: chronic illness (alcohol abuse; CHF) Signs/Symptoms: mild fat depletion, moderate muscle depletion Interventions: Refer to RD note for recommendations, Tube feeding  . Skin Assessment: I have examined the patient's skin and I agree with the wound assessment as performed by the wound care RN as outlined below:    Consultants:  Critical care  Procedures:  None  Antimicrobials:  Anti-infectives (From admission, onward)    Start     Dose/Rate Route Frequency Ordered Stop   05/01/24 1800  vancomycin  (VANCOCIN ) capsule 125 mg        125 mg Oral 4 times daily 05/01/24 1548 05/11/24 1759   04/26/24 0845  cefTRIAXone  (ROCEPHIN ) 2 g in sodium chloride  0.9 % 100 mL IVPB  Status:  Discontinued        2 g 200 mL/hr over 30 Minutes Intravenous Every 24 hours 04/26/24 0750 05/02/24 1339   04/20/24 1800  meropenem  (MERREM ) 1 g in sodium chloride  0.9 % 100 mL IVPB  Status:  Discontinued        1 g 200 mL/hr over 30 Minutes Intravenous Every 12 hours 04/19/24 1725 04/19/24 1751   04/19/24 1715  meropenem  (MERREM ) 1 g in sodium chloride  0.9 % 100 mL IVPB  Status:   Discontinued        1 g 200 mL/hr over 30 Minutes Intravenous Every 8 hours 04/19/24 1705 04/19/24 1725   04/19/24 1415  cefTRIAXone  (ROCEPHIN ) 2 g in sodium chloride  0.9 % 100 mL IVPB        2 g 200 mL/hr over 30 Minutes Intravenous Once 04/19/24 1404 04/19/24 1458   04/19/24 1415  metroNIDAZOLE  (FLAGYL ) IVPB 500 mg        500 mg 100 mL/hr over 60 Minutes Intravenous  Once 04/19/24 1404 04/19/24 1521   04/19/24 1345  vancomycin  (VANCOREADY) IVPB 1500 mg/300 mL  Status:  Discontinued        1,500 mg 150 mL/hr over 120 Minutes Intravenous  Once 04/19/24 1333 04/19/24 1403   04/19/24 1330  ceFEPIme  (MAXIPIME ) 2 g in sodium chloride  0.9 % 100 mL IVPB  Status:  Discontinued        2 g 200 mL/hr over 30 Minutes Intravenous  Once 04/19/24 1326 04/19/24 1403   04/19/24 1330  metroNIDAZOLE  (FLAGYL ) IVPB 500 mg  Status:  Discontinued        500 mg 100 mL/hr over 60 Minutes Intravenous  Once 04/19/24 1326 04/19/24 1403   04/19/24 1330  vancomycin  (  VANCOCIN ) IVPB 1000 mg/200 mL premix  Status:  Discontinued        1,000 mg 200 mL/hr over 60 Minutes Intravenous  Once 04/19/24 1326 04/19/24 1332         Subjective: Patient seen and examined.  He has no new complaint.  Continues to have diarrhea.  Objective: Vitals:   05/02/24 2110 05/03/24 0021 05/03/24 0415 05/03/24 0420  BP: (!) 145/68 127/68 (!) 150/72   Pulse: 79 77 85   Resp: 20 20 20    Temp: 98.2 F (36.8 C) 98.3 F (36.8 C) 98.5 F (36.9 C)   TempSrc: Oral Oral Oral   SpO2: 98% 98% 100%   Weight:    68.2 kg  Height:        Intake/Output Summary (Last 24 hours) at 05/03/2024 0835 Last data filed at 05/03/2024 0416 Gross per 24 hour  Intake 1389.92 ml  Output 1950 ml  Net -560.08 ml   Filed Weights   04/30/24 0500 05/02/24 0500 05/03/24 0420  Weight: 69.9 kg 68.8 kg 68.2 kg    Examination:  General exam: Appears calm and comfortable, appears chronically sick. Respiratory system: Clear to auscultation. Respiratory  effort normal. Cardiovascular system: S1 & S2 heard, RRR. No JVD, murmurs, rubs, gallops or clicks. No pedal edema. Gastrointestinal system: Abdomen is nondistended, soft and nontender. No organomegaly or masses felt. Normal bowel sounds heard. Central nervous system: Alert and oriented. No focal neurological deficits. Extremities: Symmetric 5 x 5 power. Skin: No rashes, lesions or ulcers.  Psychiatry: Judgement and insight appear normal. Mood & affect appropriate.   Data Reviewed: I have personally reviewed following labs and imaging studies  CBC: Recent Labs  Lab 04/29/24 0534 04/30/24 0623 05/01/24 0218 05/02/24 0241 05/03/24 0345  WBC 20.8* 24.5* 21.1* 19.9* 17.8*  HGB 8.5* 9.0* 8.3* 8.3* 8.1*  HCT 25.4* 26.4* 24.9* 25.1* 24.0*  MCV 98.4 98.5 97.3 97.3 98.0  PLT 342 446* 442* 508* 560*   Basic Metabolic Panel: Recent Labs  Lab 04/27/24 0008 04/28/24 0102 04/30/24 0623 05/01/24 0218 05/02/24 0857  NA 130* 128* 130* 131* 131*  K 3.5 3.8 3.6 3.3* 4.9  CL 92* 90* 89* 93* 92*  CO2 28 27 27 25 23   GLUCOSE 155* 154* 168* 227* 213*  BUN 17 17 16 16 13   CREATININE 0.72 0.81 0.85 0.79 0.72  CALCIUM  8.5* 8.3* 9.0 9.1 9.0   GFR: Estimated Creatinine Clearance: 83.8 mL/min (by C-G formula based on SCr of 0.72 mg/dL). Liver Function Tests: Recent Labs  Lab 04/27/24 0008 04/28/24 0102 04/30/24 0623 05/01/24 0218  AST 51* 47* 46* 29  ALT 45* 40 43 32  ALKPHOS 101 99 109 99  BILITOT 0.7 0.9 0.7 0.6  PROT 6.3* 6.5 7.1 6.7  ALBUMIN  2.3* 2.2* 2.3* 2.2*   No results for input(s): LIPASE, AMYLASE in the last 168 hours.  No results for input(s): AMMONIA in the last 168 hours. Coagulation Profile: No results for input(s): INR, PROTIME in the last 168 hours. Cardiac Enzymes: No results for input(s): CKTOTAL, CKMB, CKMBINDEX, TROPONINI in the last 168 hours. BNP (last 3 results) No results for input(s): PROBNP in the last 8760 hours. HbA1C: No results  for input(s): HGBA1C in the last 72 hours. CBG: Recent Labs  Lab 05/02/24 2002 05/02/24 2115 05/03/24 0019 05/03/24 0413 05/03/24 0749  GLUCAP 208* 162* 175* 175* 183*   Lipid Profile: No results for input(s): CHOL, HDL, LDLCALC, TRIG, CHOLHDL, LDLDIRECT in the last 72 hours. Thyroid Function Tests:  No results for input(s): TSH, T4TOTAL, FREET4, T3FREE, THYROIDAB in the last 72 hours. Anemia Panel: No results for input(s): VITAMINB12, FOLATE, FERRITIN, TIBC, IRON, RETICCTPCT in the last 72 hours. Sepsis Labs: No results for input(s): PROCALCITON, LATICACIDVEN in the last 168 hours.  Recent Results (from the past 240 hours)  Culture, blood (Routine X 2) w Reflex to ID Panel     Status: None   Collection Time: 04/26/24  5:41 AM   Specimen: BLOOD  Result Value Ref Range Status   Specimen Description   Final    BLOOD BLOOD RIGHT ARM Performed at [facility], [address] [place], [place] [address]    Special Requests   Final    BOTTLES DRAWN AEROBIC ONLY Blood Culture adequate volume Performed at [facility], [address] [place], [place] [address]    Culture   Final    NO GROWTH 5 DAYS Performed at [facility], [address], [place], [place] [address]    Report Status 05/01/2024 FINAL  Final  Culture, blood (Routine X 2) w Reflex to ID Panel     Status: None   Collection Time: 04/26/24  5:41 AM   Specimen: BLOOD  Result Value Ref Range Status   Specimen Description   Final    BLOOD BLOOD RIGHT ARM Performed at [facility], [address], [place], [place] [address]    Special Requests   Final    BOTTLES DRAWN AEROBIC ONLY Blood Culture adequate volume Performed at [facility], [address], [place], [place] [address]    Culture   Final    NO GROWTH 5 DAYS Performed at [facility], [address], [place], [place] [address]     Report Status 05/01/2024 FINAL  Final  Culture, blood (Routine X 2) w Reflex to ID Panel     Status: None (Preliminary result)   Collection Time: 05/01/24  9:01 AM   Specimen: BLOOD  Result Value Ref Range Status   Specimen Description   Final    BLOOD BLOOD RIGHT ARM Performed at [facility], [address], [place], [place] [address]    Special Requests   Final    BOTTLES DRAWN AEROBIC AND ANAEROBIC Blood Culture results may not be optimal due to an inadequate volume of blood received in culture bottles Performed at [facility], [address] [place], [place] [address]    Culture   Final    NO GROWTH < 24 HOURS Performed at [facility], [address], [place], [place] [address]    Report Status PENDING  Incomplete  Culture, blood (Routine X 2) w Reflex to ID Panel     Status: None (Preliminary result)   Collection Time: 05/01/24  9:01 AM   Specimen: BLOOD  Result Value Ref Range Status   Specimen Description   Final    BLOOD BLOOD RIGHT HAND Performed at [facility], [address], [place], [place] [address]    Special Requests   Final    BOTTLES DRAWN AEROBIC AND ANAEROBIC Blood Culture results may not be optimal due to an inadequate volume of blood received in culture bottles Performed at [facility], [address], [place], [place] [address]    Culture   Final    NO GROWTH < 24 HOURS Performed at [facility], [address] [place], [place] [address]  Report Status PENDING  Incomplete  C Difficile Quick Screen (NO PCR Reflex)     Status: Abnormal   Collection Time: 05/01/24  3:04 PM   Specimen: STOOL  Result Value Ref Range Status   C Diff antigen POSITIVE (A) NEGATIVE Final   C Diff toxin POSITIVE (A) NEGATIVE Final   C Diff interpretation Toxin producing C. difficile detected.  Final    Comment: CRITICAL RESULT CALLED TO, READ BACK BY AND VERIFIED WITH: JOESEPH LABOR RN AT 1543 ON 05/01/2024 BY PRUDY POUR Performed at [facility], [address] [place], [place] [address]      Radiology Studies: DG CHEST PORT 1 VIEW Result Date: 05/01/2024 CLINICAL DATA:  Recent admission for pancreatitis. EXAM: PORTABLE CHEST 1 VIEW COMPARISON:  04/25/2024 FINDINGS: Feeding tube is identified with tip in the left upper quadrant of the abdomen in the expected location of the duodenal jejunal junction. Normal heart size. Unchanged left pleural effusion with overlying atelectasis versus consolidation. Persistent right middle lobe atelectasis with improved aeration to the right lower lobe. Visualized osseous structures are unremarkable. IMPRESSION: 1. Unchanged left pleural effusion with overlying atelectasis versus consolidation. 2. Persistent right middle lobe atelectasis with improved aeration to the right lower lobe. Electronically Signed   By: Waddell Calk M.D.   On: 05/01/2024 12:41    Scheduled Meds:  (feeding supplement) PROSource Plus  30 mL Oral BID BM   amLODipine   10 mg Oral Daily   apixaban   5 mg Oral BID   carvedilol   12.5 mg Oral BID WC   Chlorhexidine  Gluconate Cloth  6 each Topical Daily   feeding supplement  1 Container Oral TID BM   feeding supplement (PROSource TF20)  60 mL Per Tube Daily   folic acid   1 mg Intravenous Daily   furosemide   40 mg Oral Daily   insulin  aspart  0-15 Units Subcutaneous Q4H   insulin  aspart  6 Units Subcutaneous Q4H   insulin  glargine-yfgn  50 Units Subcutaneous Daily   multivitamin  15 mL Per Tube Daily   nicotine   21 mg Transdermal Daily   spironolactone   25 mg Oral Daily   tamsulosin   0.8 mg Oral Daily   thiamine   100 mg Oral Daily   Or   thiamine   100 mg Intravenous Daily   vancomycin   125 mg Oral QID   Continuous Infusions:  feeding supplement (OSMOLITE 1.5 CAL) 55 mL/hr at 05/03/24 0349     LOS: 14 days   Fredia Skeeter, MD [facility]  05/03/2024, 8:35 AM   *Please note that this  is a verbal dictation therefore any spelling or grammatical errors are due to the Dragon Medical One system interpretation.  Please page via Amion and do not message via secure chat for urgent patient care matters. Secure chat can be used for non urgent patient care matters.  How to contact the [facility] Attending or Consulting provider 7A - 7P or covering provider during after hours 7P -7A, for this patient?  Check the care team in [facility] and look for a) attending/consulting [facility] provider listed and b) the [facility] team listed. Page or secure chat 7A-7P. Log into [email or web address] and use [place]'s universal password to access. If you do not have the password, please contact the hospital operator. Locate the [facility] provider you are looking for under [facility] and page to a number that you can be directly reached. If you still have difficulty reaching the provider, please page the [facility] (Director  on Call) for the Hospitalists listed on amion for assistance.

## 2024-05-03 NOTE — Telephone Encounter (Signed)
 Patient Product/process development scientist completed.    The patient is insured through [facility]. Patient has Medicare and is not eligible for a copay card, but may be able to apply for patient assistance or Medicare RX Payment Plan (Patient Must reach out to their plan, if eligible for payment plan), if available.    Ran test claim for Eliquis 5 mg and the current 30 day co-pay is $0.00.  Ran test claim for Xarelto 20 mg and the current 30 day co-pay is $0.00.  This test claim was processed through [facility]- copay amounts may vary at other pharmacies due to pharmacy/plan contracts, or as the patient moves through the different stages of their insurance plan.     [name], CPHT Pharmacy Technician III Certified Patient Advocate [facility] Patient Advocate Team Direct Number: [phone]  Fax: [phone]

## 2024-05-03 NOTE — NC FL2 (Signed)
 [place]  MEDICAID FL2 LEVEL OF CARE FORM     IDENTIFICATION  Patient Name: [name] Birthdate: 1956/01/05 Sex: male Admission Date (Current Location): 04/19/2024  [place] and [place] Number:  Lloyd [id number] Facility and Address:  [facility],  [address], [place] [address]      Provider Number: [id number]  Attending Physician Name and Address:  Vernon Ranks, MD  Relative Name and Phone Number:  Imad Shostak (brother) Ph: [phone]    Current Level of Care: Hospital Recommended Level of Care: Skilled Nursing Facility Prior Approval Number:    Date Approved/Denied:   PASRR Number: [id number]  Discharge Plan: SNF    Current Diagnoses: Patient Active Problem List   Diagnosis Date Noted   Hemoptysis 04/25/2024   Acute hypoxemic respiratory failure (HCC) 04/25/2024   Malnutrition of moderate degree 04/21/2024   Pancreatitis, alcoholic, acute 04/19/2024   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/2023   Preop examination 10/28/2023   Osteoarthritis of left hip 10/28/2023   Hyperlipidemia LDL goal <55 10/28/2023   Alcoholic intoxication without complication (HCC) 05/16/2021   Marijuana use 05/16/2021   CVA (cerebral vascular accident) (HCC) 05/14/2021   Alcohol abuse 05/14/2021   Tobacco abuse 05/14/2021   Type 2 diabetes mellitus with complication, without long-term current use of insulin  (HCC) 05/14/2021   Angioedema 02/09/2017   Primary localized osteoarthritis of right hip 01/02/2016   Groin rash 06/16/2013   DM w/o complication type II (HCC) 06/16/2013   BPH (benign prostatic hyperplasia) 11/24/2012   Healthcare maintenance 11/24/2012   Pancreatitis 10/31/2012   Chronic pain syndrome 09/18/2011   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/19/2011   Right rotator cuff tear 07/26/2011   Shoulder pain, right 05/03/2011   Right ankle pain 01/23/2011   Left wrist pain 01/23/2011   Back pain 01/02/2011   MOLLUSCUM CONTAGIOSUM 12/05/2008   LEUKOCYTOSIS  UNSPECIFIED 03/25/2008   LIVER FUNCTION TESTS, ABNORMAL 03/25/2008   ERECTILE DYSFUNCTION 05/12/2007   Essential hypertension 03/02/2007   Hypercholesteremia 01/08/2007   ANXIETY 01/08/2007   GERD 01/08/2007   Rheumatoid arthritis (HCC) 01/08/2007   OSTEOARTHRITIS 01/08/2007    Orientation RESPIRATION BLADDER Height & Weight     Self, Time, Situation, Place  Normal Continent Weight: 150 lb 4.8 oz (68.2 kg) Height:  5' 7 (170.2 cm)  BEHAVIORAL SYMPTOMS/MOOD NEUROLOGICAL BOWEL NUTRITION STATUS      Continent Diet (Carb modified diet)  AMBULATORY STATUS COMMUNICATION OF NEEDS Skin   Extensive Assist Verbally Skin abrasions (Abrasions: bilateral legs)                       Personal Care Assistance Level of Assistance  Bathing, Feeding, Dressing Bathing Assistance: Limited assistance Feeding assistance: Independent Dressing Assistance: Limited assistance     Functional Limitations Info  Sight, Hearing, Speech Sight Info: Impaired Hearing Info: Adequate Speech Info: Adequate    SPECIAL CARE FACTORS FREQUENCY  PT (By licensed PT), OT (By licensed OT)     PT Frequency: 5x's/week OT Frequency: 5x's/week            Contractures Contractures Info: Not present    Additional Factors Info  Code Status, Allergies, Insulin  Sliding Scale Code Status Info: Full Allergies Info: Lisinopril , Citalopram, Morphine , Nsaids, Paroxetine   Insulin  Sliding Scale Info: See discharge summary       Current Medications (05/03/2024):  This is the current hospital active medication list Current Facility-Administered Medications  Medication Dose Route Frequency Provider Last Rate Last Admin   (feeding supplement) PROSource  Plus liquid 30 mL  30 mL Oral BID BM Pahwani, Ravi, MD   30 mL at 05/03/24 0820   acetaminophen  (TYLENOL ) tablet 650 mg  650 mg Per Tube Q6H PRN Paliwal, Aditya, MD   650 mg at 05/01/24 0855   amLODipine  (NORVASC ) tablet 10 mg  10 mg Oral Daily Pahwani, Ravi, MD   10  mg at 05/03/24 1005   apixaban  (ELIQUIS ) tablet 5 mg  5 mg Oral BID Shade, Christine E, RPH   5 mg at 05/03/24 1005   carvedilol  (COREG ) tablet 12.5 mg  12.5 mg Oral BID WC Vernon Ranks, MD   12.5 mg at 05/03/24 9180   Chlorhexidine  Gluconate Cloth 2 % PADS 6 each  6 each Topical Daily Desai, Nikita S, MD   6 each at 05/03/24 1005   docusate (COLACE) 50 MG/5ML liquid 100 mg  100 mg Per Tube BID PRN Desai, Nikita S, MD       feeding supplement (BOOST / RESOURCE BREEZE) liquid 1 Container  1 Container Oral TID BM Vernon Ranks, MD   1 Container at 05/03/24 1029   feeding supplement (OSMOLITE 1.5 CAL) liquid 1,000 mL  1,000 mL Per Tube Continuous Augustus Almarie RAMAN, PA-C 55 mL/hr at 05/03/24 0349 Infusion Verify at 05/03/24 0349   feeding supplement (PROSource TF20) liquid 60 mL  60 mL Per Tube Daily Arloa Folks D, NP   60 mL at 05/03/24 1013   folic acid  injection 1 mg  1 mg Intravenous Daily Carolee Browning T, RPH   1 mg at 05/02/24 1036   furosemide  (LASIX ) tablet 40 mg  40 mg Oral Daily Pahwani, Ravi, MD   40 mg at 05/03/24 1011   hydrALAZINE  (APRESOLINE ) injection 10 mg  10 mg Intravenous Q4H PRN Paliwal, Aditya, MD   10 mg at 04/24/24 2007   HYDROmorphone  (DILAUDID ) injection 0.5 mg  0.5 mg Intravenous Q4H PRN Pahwani, Ravi, MD   0.5 mg at 05/03/24 1015   insulin  aspart (novoLOG ) injection 0-15 Units  0-15 Units Subcutaneous Q4H Arloa Folks D, NP   3 Units at 05/03/24 0819   insulin  aspart (novoLOG ) injection 6 Units  6 Units Subcutaneous Q4H Vann, Jessica U, DO   6 Units at 05/03/24 9180   insulin  glargine-yfgn (SEMGLEE ) injection 50 Units  50 Units Subcutaneous Daily Vernon Ranks, MD   50 Units at 05/03/24 1030   labetalol  (NORMODYNE ) injection 10 mg  10 mg Intravenous Q2H PRN Paliwal, Aditya, MD   10 mg at 04/25/24 0449   melatonin tablet 5 mg  5 mg Oral QHS PRN Daniels, James K, NP   5 mg at 05/02/24 0007   multivitamin liquid 15 mL  15 mL Per Tube Daily Meade Verdon RAMAN, MD   15 mL  at 05/03/24 1011   nicotine  (NICODERM CQ  - dosed in mg/24 hours) patch 21 mg  21 mg Transdermal Daily Rosan Deward ORN, NP   21 mg at 05/03/24 1012   ondansetron  (ZOFRAN ) injection 4 mg  4 mg Intravenous Q6H PRN Desai, Nikita S, MD       Oral care mouth rinse  15 mL Mouth Rinse PRN Geronimo Amel, MD       oxyCODONE  (Oxy IR/ROXICODONE ) immediate release tablet 5 mg  5 mg Oral Q4H PRN Pahwani, Ravi, MD   5 mg at 05/02/24 1703   phenol (CHLORASEPTIC) mouth spray 1 spray  1 spray Mouth/Throat PRN Vann, Jessica U, DO   1 spray at 05/03/24 1005   polyethylene  glycol (MIRALAX  / GLYCOLAX ) packet 17 g  17 g Per Tube Daily PRN Desai, Nikita S, MD       spironolactone  (ALDACTONE ) tablet 25 mg  25 mg Oral Daily Pahwani, Ravi, MD   25 mg at 05/03/24 1005   tamsulosin  (FLOMAX ) capsule 0.8 mg  0.8 mg Oral Daily Pahwani, Ravi, MD   0.8 mg at 05/03/24 1005   thiamine  (VITAMIN B1) tablet 100 mg  100 mg Oral Daily Pahwani, Ravi, MD   100 mg at 05/03/24 1005   Or   thiamine  (VITAMIN B1) injection 100 mg  100 mg Intravenous Daily Pahwani, Ravi, MD   100 mg at 04/29/24 0944   vancomycin  (VANCOCIN ) capsule 125 mg  125 mg Oral QID Pahwani, Ravi, MD   125 mg at 05/03/24 1011     Discharge Medications: Please see discharge summary for a list of discharge medications.  Relevant Imaging Results:  Relevant Lab Results:   Additional Information SSN: [id number]  Duwaine GORMAN Aran, LCSW

## 2024-05-03 NOTE — Progress Notes (Signed)
 Nutrition Follow-up  DOCUMENTATION CODES:   Non-severe (moderate) malnutrition in context of chronic illness  INTERVENTION:  - Adjust to nocturnal TF's to promote better appetite/intake during the day: Osmolite 1.5 at 55 ml/h x12 hours overnight (1800-0600) Provides 990 kcal, 41 gm protein, 503 ml free water daily  - 48 hour calorie count to start tomorrow 7/8 at breakfast and run through 7/9.  - Please document % intake of all foods, drinks, and nutrition supplements patient consumes on meal tickets and place in envelope on patient's door.  - If patient skips/refuses meals please document 0% for that meal.  - Discussed with RN.  - Carb Modified diet per MD.  - Boost Breeze po TID, each supplement provides 250 kcal and 9 grams of protein. - ProSource Plus TID, each supplement provides 100 kcals and 15g protein. - Encourage intake and monitor oral intake.  - Continue 1mg  folic acid , 100mg  thiamine , and Multivitamin with minerals daily.               **Recommend continuing tube feeds until patient consistently meeting >50% of estimated needs with oral intake.   NUTRITION DIAGNOSIS:   Moderate Malnutrition related to chronic illness (alcohol abuse; CHF) as evidenced by mild fat depletion, moderate muscle depletion. *ongoing  GOAL:   Patient will meet greater than or equal to 90% of their needs *progressing  MONITOR:   Diet advancement, Labs, Weight trends, TF tolerance  REASON FOR ASSESSMENT:   Consult Poor PO  ASSESSMENT:   68 y.o. male with PMH alcohol abuse, CHF, DM2, pancreatitis, and RA who presented with abdominal pain and admitted for severe acute necrotizing alcoholic pancreatitis.  6/23: admitted 6/25: post-pyloric Cortrak placed; TF's initiated  6/30: CLD 7/2: FLD 7/3: DYS 3 diet 7/4: Calorie count initiated 7/6: Advanced to a carb modified diet  Calorie count started Friday, results below.   7/4: Breakfast: 150 kcals, 6g protein Lunch: 417 kcals,  17g protein Dinner: 65 kcals, 3g protein Supplements: Boost Breeze x2 = 500 kcals, 18g protein & 1 ProSource Plus = 100 kcals, 15g protein Total intake: 1232 kcal (65% of minimum estimated needs)  59 protein (65% of minimum estimated needs)  7/5: *Only ordered breakfast, no intake documented in calorie count envelope or flowsheets 1 Boost Breeze, 1 ProSource Plus  7/6: *Only ordered lunch, no intake documented in calorie count envelope or flowsheets *Refused all supplements   Patient on bedside commode at time of visit today, PT in room to work with patient.  Discussed results of calorie count with MD. At this time, do not feel patient not taking in enough orally to completely come off tube feeds.  Plan to change to nocturnal tube feeds to see if this promotes a better appetite/intake during the day while still meeting 50% of needs. Will initiate another calorie count to assess intake with TF change.  Discussed plan with RN.   Admit weight: 147# Current weight: 150#  I&O's: +7L since admit   Medications reviewed and include: 1mg  folic acid , MVI, 100mg  thiamine , Lasix    Labs reviewed:  No BMP today HA1C 6.1 Blood Glucose 162-233 x24 hours   Diet Order:   Diet Order             Diet Carb Modified Fluid consistency: Thin; Room service appropriate? Yes  Diet effective now                   EDUCATION NEEDS:  Not appropriate for education at this time  Skin:  Skin Assessment: Reviewed RN Assessment  Last BM:  7/7 - type 6  Height:  Ht Readings from Last 1 Encounters:  04/23/24 5' 7 (1.702 m)   Weight:  Wt Readings from Last 1 Encounters:  05/03/24 68.2 kg   BMI:  Body mass index is 23.54 kg/m.  Estimated Nutritional Needs:  Kcal:  1900-2050 kcals Protein:  90-100 grams Fluid:  >/= 1.9L    Trude Ned RD, LDN Contact via Secure Chat.

## 2024-05-03 NOTE — Progress Notes (Signed)
 Physical Therapy Treatment Patient Details Name: [name] MRN: [id number] DOB: 09/22/1956 Today's Date: 05/03/2024   History of Present Illness 68 yo male presents to therapy following hospital admission on 04/19/2024 secondary to acute necrotizing pancreatis attributed to ETOH. 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 Pt PMH includes but is not limited to: alcohol abuse, CHF, DM II, GERD, hepatitis, HLD, HTN, OA, pancreatis, RA, CVA, tobacco abuse, and B THA.    PT Comments  AxO x 3 pleasant and willing.  Pt familiar from prior admits THR.  Was living home alone but stated his daughter plans to move in with him at his house. Assisted OOB was difficult.  General bed mobility comments: assist for upper body and increased time/effort to scoot to EOB. General transfer comment: Pt required Mod/Max assist to transfer from elevated bed to [facility] 1/4 turn and MAX VC's on proper hand transfer and turn completion. General Gait Details: Used B platform EVA walker for Pt's first attempt at amb.  Required + 2 assist with heavy lean/support on EVA walker and recliner following closely behind. VERY weak.  Returned to room in recliner.  Positioned to comfort. LPT has rec Pt will need ST Rehab at SNF to address mobility and functional decline prior to safely returning home.    If plan is discharge home, recommend the following: Two people to help with walking and/or transfers;A lot of help with bathing/dressing/bathroom;Assistance with cooking/housework;Help with stairs or ramp for entrance;Assist for transportation   Can travel by private vehicle     No  Equipment Recommendations  None recommended by PT    Recommendations for Other Services       Precautions / Restrictions Precautions Precautions: Fall Precaution/Restrictions Comments: NG Tube (feeding) Restrictions Weight Bearing  Restrictions Per Provider Order: No     Mobility  Bed Mobility Overal bed mobility: Needs Assistance Bed Mobility: Supine to Sit     Supine to sit: Mod assist, Max assist     General bed mobility comments: assist for upper body and increased time/effort to scoot to EOB.    Transfers Overall transfer level: Needs assistance Equipment used: None Transfers: Bed to chair/wheelchair/BSC   Stand pivot transfers: +2 physical assistance, +2 safety/equipment, Mod assist, Max assist         General transfer comment: Pt required Mod/Max assist to transfer from elevated bed to [facility] 1/4 turn and MAX VC's on proper hand transfer and turn completion.    Ambulation/Gait Ambulation/Gait assistance: Max assist, +2 physical assistance, +2 safety/equipment Gait Distance (Feet): 16 Feet Assistive device: Bilateral platform walker Gait Pattern/deviations: Step-to pattern, Knee flexed in stance - right, Knee flexed in stance - left, Trunk flexed Gait velocity: decreased     General Gait Details: Used B platform EVA walker for Pt's first attempt at amb.  Required + 2 assist with heavy lean/support on EVA walker and recliner following closely behind. VERY weak.   Stairs             Wheelchair Mobility     Tilt Bed    Modified Rankin (Stroke Patients Only)       Balance                                            Communication  Cognition Arousal: Alert Behavior During Therapy: WFL for tasks assessed/performed   PT - Cognitive impairments: No apparent impairments                       PT - Cognition Comments: AxO x 3 pleasant and willing.  Pt familiar from prior admits THR.  Was living home alone but stated his daughter plans to move in with him at his house.        Cueing    Exercises      General Comments        Pertinent Vitals/Pain Pain Assessment Pain Assessment: Faces Faces Pain Scale: Hurts a little bit Pain Location:  abdomen Pain Descriptors / Indicators: Aching, Constant, Discomfort, Dull, Grimacing Pain Intervention(s): Monitored during session    Home Living                          Prior Function            PT Goals (current goals can now be found in the care plan section) Progress towards PT goals: Progressing toward goals    Frequency    Min 2X/week      PT Plan      Co-evaluation              AM-PAC PT 6 Clicks Mobility   Outcome Measure  Help needed turning from your back to your side while in a flat bed without using bedrails?: A Lot Help needed moving from lying on your back to sitting on the side of a flat bed without using bedrails?: A Lot Help needed moving to and from a bed to a chair (including a wheelchair)?: A Lot Help needed standing up from a chair using your arms (e.g., wheelchair or bedside chair)?: A Lot Help needed to walk in hospital room?: A Lot Help needed climbing 3-5 steps with a railing? : Total 6 Click Score: 11    End of Session Equipment Utilized During Treatment: Gait belt Activity Tolerance: Patient limited by fatigue Patient left: in chair;with call bell/phone within reach Nurse Communication: Mobility status PT Visit Diagnosis: Unsteadiness on feet (R26.81);Other abnormalities of gait and mobility (R26.89);Muscle weakness (generalized) (M62.81);Pain     Time: 8781-8753 PT Time Calculation (min) (ACUTE ONLY): 28 min  Charges:    $Gait Training: 8-22 mins $Therapeutic Activity: 8-22 mins PT General Charges $$ ACUTE PT VISIT: 1 Visit                     Katheryn Leap  PTA Acute  Rehabilitation Services Office M-F          [phone]

## 2024-05-03 NOTE — Plan of Care (Signed)

## 2024-05-03 NOTE — Telephone Encounter (Signed)
 Patient Product/process development scientist completed.    The patient is insured through [facility]. Patient has Medicare and is not eligible for a copay card, but may be able to apply for patient assistance or Medicare RX Payment Plan (Patient Must reach out to their plan, if eligible for payment plan), if available.    Ran test claim for vancomycin 125 mg  and the current 10 day co-pay is $0.00.  Ran test claim for Dificid 200 mg  and the current 10 day co-pay is $0.00.  This test claim was processed through [facility]- copay amounts may vary at other pharmacies due to pharmacy/plan contracts, or as the patient moves through the different stages of their insurance plan.     Roland Earl, CPHT Pharmacy Technician III Certified Patient Advocate [facility] Patient Advocate Team Direct Number: [phone]  Fax: [phone]

## 2024-05-04 DIAGNOSIS — K8521 Alcohol induced acute pancreatitis with uninfected necrosis: Secondary | ICD-10-CM | POA: Diagnosis not present

## 2024-05-04 LAB — BASIC METABOLIC PANEL WITH GFR
Anion gap: 12 (ref 5–15)
BUN: 11 mg/dL (ref 8–23)
CO2: 23 mmol/L (ref 22–32)
Calcium: 9.1 mg/dL (ref 8.9–10.3)
Chloride: 96 mmol/L — ABNORMAL LOW (ref 98–111)
Creatinine, Ser: 0.58 mg/dL — ABNORMAL LOW (ref 0.61–1.24)
GFR, Estimated: >60 mL/min (ref >60)
Glucose, Bld: 172 mg/dL — ABNORMAL HIGH (ref 70–99)
Potassium: 4.2 mmol/L (ref 3.5–5.1)
Sodium: 131 mmol/L — ABNORMAL LOW (ref 135–145)

## 2024-05-04 LAB — GLUCOSE, CAPILLARY
Glucose-Capillary: 109 mg/dL — ABNORMAL HIGH (ref 70–99)
Glucose-Capillary: 157 mg/dL — ABNORMAL HIGH (ref 70–99)
Glucose-Capillary: 165 mg/dL — ABNORMAL HIGH (ref 70–99)
Glucose-Capillary: 169 mg/dL — ABNORMAL HIGH (ref 70–99)
Glucose-Capillary: 191 mg/dL — ABNORMAL HIGH (ref 70–99)
Glucose-Capillary: 247 mg/dL — ABNORMAL HIGH (ref 70–99)

## 2024-05-04 MED ORDER — ENSURE MAX PROTEIN PO LIQD
11.0000 [oz_av] | Freq: Every day | ORAL | Status: DC
  Administered 2024-05-04 – 2024-05-05 (×2): 11 [oz_av] via ORAL
  Filled 2024-05-04 (×2): qty 330

## 2024-05-04 MED ORDER — TRAZODONE HCL 50 MG PO TABS
25.0000 mg | ORAL_TABLET | Freq: Once | ORAL | Status: AC
Start: 2024-05-04 — End: 2024-05-04
  Administered 2024-05-04: 25 mg via ORAL
  Filled 2024-05-04: qty 1

## 2024-05-04 NOTE — TOC Progression Note (Signed)
 Transition of Care [facility]) - Progression Note   Patient Details  Name: [name] MRN: [id number] Date of Birth: Apr 29, 1956  Transition of Care [facility]) CM/SW Contact  Duwaine GORMAN Aran, LCSW Phone Number: 05/04/2024, 11:03 AM  Clinical Narrative: Patient received the following bed offers:  [facility] [address] [place], [place] [address] [phone] Overall rating ??  Below average  [facility] [address] [place], [place] [address] [phone] Overall rating ? Much below average  [facility] [address] [place], [place] [address] [phone] Overall rating ?? Below average  [facility] [address] [place], [place] [address] [phone] Overall rating ? Much below average  Patient reported he wanted to accept the bed offer for [facility]. CSW spoke with Kia in admissions at [facility] and confirmed bed will be available 05/06/24. CSW completed insurance authorization on [employer] portal. Reference ID # is: [id number]. Patient is approved for 05/06/2024-05/10/2024. CSW notified Kia of insurance approval. TOC to follow.  Expected Discharge Plan: Skilled Nursing Facility Barriers to Discharge: Continued Medical Work up, SNF Pending bed offer, English as a second language teacher  Expected Discharge Plan and Services In-house Referral: Clinical Social Work Discharge Planning Services: CM Consult Post Acute Care Choice: Skilled Nursing Facility Living arrangements for the past 2 months: Single Family Home             DME Arranged: N/A DME Agency: NA HH Arranged: NA HH Agency: NA  Social Determinants of Health (SDOH) Interventions SDOH Screenings   Food Insecurity: Patient Unable To Answer (04/20/2024)  Housing: Patient Unable To Answer (04/20/2024)  Transportation Needs: Patient Unable To Answer (04/20/2024)  Utilities: Patient Unable To Answer (04/20/2024)   Alcohol Screen: Low Risk  (07/10/2023)  Depression (PHQ2-9): High Risk (04/02/2024)  Financial Resource Strain: Low Risk  (07/10/2023)  Physical Activity: Sufficiently Active (07/10/2023)  Social Connections: Patient Unable To Answer (04/20/2024)  Stress: No Stress Concern Present (07/10/2023)  Tobacco Use: High Risk (04/19/2024)  Health Literacy: Adequate Health Literacy (07/10/2023)   Readmission Risk Interventions    04/21/2024   11:05 AM  Readmission Risk Prevention Plan  Transportation Screening Complete  PCP or Specialist Appt within 3-5 Days Complete  HRI or Home Care Consult Complete  Social Work Consult for Recovery Care Planning/Counseling Complete  Palliative Care Screening Not Applicable  Medication Review Oceanographer) Complete

## 2024-05-04 NOTE — Progress Notes (Signed)
 Calorie Count Note  48 hour calorie count ordered.  Diet: Carb Modified Supplements: Boost Breeze po BID, ProSource Plus TID, and Ensure Max po daily  7/4: Breakfast: 150 kcals, 6g protein Lunch: 417 kcals, 17g protein Dinner: 65 kcals, 3g protein Supplements: Boost Breeze x2 = 500 kcals, 18g protein & 1 ProSource Plus = 100 kcals, 15g protein Total intake: 1232 kcal (65% of minimum estimated needs)  59 protein (65% of minimum estimated needs)   7/5: *Only ordered breakfast, no intake documented in calorie count envelope or flowsheets 1 Boost Breeze, 1 ProSource Plus = 350 kcals, 24g protein   7/6: *Only ordered lunch, no intake documented in calorie count envelope or flowsheets *Refused all supplements  7/7: *Only ordered lunch, no intake documented in calorie count envelope or flowsheets *2 Boost Breeze = 500 kcals, 18g protein  7/8: Breakfast: 100% = 415 kcals, 8g protein Lunch: *awaiting RN to know how much patient had for lunch Supplements: Ensure Max = 150 kcals, 30g protein   Nutrition Dx: Moderate Malnutrition related to chronic illness (alcohol abuse; CHF) as evidenced by mild fat depletion, moderate muscle depletion   Goal: Patient will meet greater than or equal to 90% of their needs   Subjective: Met with patient at bedside. He reports transitioning to nocturnal TF's has helped his appetite. Endorses having had all his breakfast which was confirmed with the RN. He endorses liking the [facility], although noted to only be occasionally accepting it. Unsure if he has been receiving ProSource Plus but according to the [facility] he has, occasionally accepting. He is agreeable to try Ensure Max as well to support oral intake.  Stressed importance of ordering and trying to consume 3 meals a day in addition to supplements to increase oral intake and allow for Cortrak to be removed. Patient endorsed understanding and states he will try to eat well.  Intervention: - 48 hour  calorie count to continue to run through 7/9.             - Please document % intake of all foods, drinks, and nutrition supplements patient consumes on meal tickets and place in envelope on patient's door.             - If patient skips/refuses meals please document 0% for that meal.             - Discussed with RN.  - Carb Modified diet per MD.  - Boost Breeze po BID, each supplement provides 250 kcal and 9 grams of protein. - ProSource Plus TID, each supplement provides 100 kcals and 15g protein. - Ensure Max po daily, provides 150 kcal and 30 grams of protein. - Encourage intake and monitor oral intake. - Continue 1mg  folic acid , 100mg  thiamine , and Multivitamin with minerals daily.   - Osmolite 1.5 at 55 ml/h x12 hours overnight (1800-0600) Provides 990 kcal, 41 gm protein, 503 ml free water daily  **Recommend continuing tube feeds until patient consistently meeting >50% of estimated needs with oral intake.    Trude Ned RD, LDN Contact via [employer].

## 2024-05-04 NOTE — Progress Notes (Signed)
 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 Pt PMH includes but is not limited to: alcohol abuse, CHF, DM II, GERD, hepatitis, HLD, HTN, OA, pancreatis, RA, CVA, tobacco abuse, and B THA.   OT comments  The pt was seen for progression of functional and functional strengthening needed to achieve improved ADL performance. He required min assist to stand using RW, then to perform a step-pivot transfer to the bedside commode. He required max assist overall for toileting management, with assistance needed to perform posterior peri-hygiene in standing, as well as assist for steadying and clothing management. He subsequently required set-up assist for upper body grooming seated in the chair. Overall, he presented with improved activity tolerance and overall functional abilities today, though he did report having 5/10 abdominal pain. Continue OT plan of care. Patient will benefit from continued inpatient follow up therapy, <3 hours/day       If plan is discharge home, recommend the following:  A lot of help with bathing/dressing/bathroom;Help with stairs or ramp for entrance;Assistance with cooking/housework;A little help with walking and/or transfers   Equipment Recommendations  Tub/shower seat    Recommendations for Other Services      Precautions / Restrictions Precautions Precautions: Fall Precaution/Restrictions Comments:  (NG tube) Restrictions Weight Bearing Restrictions Per Provider Order: No       Mobility Bed Mobility Overal bed mobility: Needs Assistance Bed Mobility: Supine to Sit     Supine to sit:  Contact guard, HOB elevated, Used rails          Transfers Overall transfer level: Needs assistance Equipment used: Rolling walker (2 wheels) Transfers: Sit to/from Stand, Bed to chair/wheelchair/BSC Sit to Stand: Min assist           General transfer comment: He required verbal cues for reaching for the bedside commode rail and walker positioning     Balance     Sitting balance-Leahy Scale: Good       Standing balance-Leahy Scale:  (Min assist with RW)         ADL either performed or assessed with clinical judgement   ADL       Grooming: Supervision/safety;Set up;Sitting Grooming Details (indicate cue type and reason): He performed face washing and hand washing seated in the bedside chair.                 Toilet Transfer: Minimal assistance;BSC/3in1;Rolling walker (2 wheels);Stand-pivot   Toileting- Clothing Manipulation and Hygiene: Maximal assistance;Sit to/from stand Toileting - Clothing Manipulation Details (indicate cue type and reason): The pt was instructed on toileting tasks at bedside commode level. He used a RW for support in standing, and subsequently required assist for posterior peri-hygiene after having a bowel movement, as he was unable to release one upper extremity off the walker in order to safely perform hygiene.                      Communication Communication Communication: No apparent difficulties   Cognition Arousal: Alert Behavior During Therapy: WFL for tasks assessed/performed        Following commands: Intact  Pertinent Vitals/ Pain       Pain Assessment Pain Score: 5  Pain Location: abdomen Pain Intervention(s): Monitored during session   Frequency  Min 2X/week        Progress Toward Goals  OT Goals(current goals can now be found in the care plan section)  Progress towards OT goals: Progressing toward goals  Acute Rehab OT Goals OT Goal Formulation: With patient Time For  Goal Achievement: 05/14/24 Potential to Achieve Goals: Good  Plan         AM-PAC OT 6 Clicks Daily Activity     Outcome Measure   Help from another person eating meals?: None Help from another person taking care of personal grooming?: A Little Help from another person toileting, which includes using toliet, bedpan, or urinal?: A Lot Help from another person bathing (including washing, rinsing, drying)?: A Lot Help from another person to put on and taking off regular upper body clothing?: A Little Help from another person to put on and taking off regular lower body clothing?: A Lot 6 Click Score: 16    End of Session Equipment Utilized During Treatment: Gait belt;Rolling walker (2 wheels)  OT Visit Diagnosis: Unsteadiness on feet (R26.81);Other abnormalities of gait and mobility (R26.89);Muscle weakness (generalized) (M62.81);Pain Pain - part of body:  (abdomen)   Activity Tolerance Patient tolerated treatment well   Patient Left in chair;with call bell/phone within reach;with chair alarm set;with family/visitor present   Nurse Communication Other (comment)        Time: 8498-8480 OT Time Calculation (min): 18 min  Charges: OT General Charges $OT Visit: 1 Visit OT Treatments $Self Care/Home Management : 8-22 mins     Delanna LITTIE Molt, OTR/L 05/04/2024, 5:08 PM

## 2024-05-04 NOTE — Progress Notes (Signed)
 PHARMACY - ANTICOAGULATION CONSULT NOTE  Pharmacy Consult for apixaban   Indication: SMV thrombus  Allergies  Allergen Reactions   Lisinopril  Swelling and Other (See Comments)    angioedema   Citalopram Nausea Only   Morphine  Nausea And Vomiting   Nsaids Other (See Comments)    Stomach Irritability    Paroxetine Nausea And Vomiting    Patient Measurements: Height: 5' 7 (170.2 cm) Weight: 66.2 kg (146 lb) IBW/kg (Calculated) : 66.1 HEPARIN  DW (KG): 65.6  Vital Signs: Temp: 98.2 F (36.8 C) (07/08 0355) Temp Source: Oral (07/08 0355) BP: 134/61 (07/08 0355) Pulse Rate: 78 (07/08 0355)  Labs: Recent Labs    05/02/24 0241 05/02/24 0857 05/03/24 0345  HGB 8.3*  --  8.1*  HCT 25.1*  --  24.0*  PLT 508*  --  560*  CREATININE  --  0.72  --     Estimated Creatinine Clearance: 83.8 mL/min (by C-G formula based on SCr of 0.72 mg/dL).   Medical History: Past Medical History:  Diagnosis Date   Alcohol abuse    Allergic rhinitis    CHF (congestive heart failure) (HCC)    Diabetes mellitus without complication (HCC)    'sometime last yr'--type 2   GERD (gastroesophageal reflux disease)    Hepatitis    he thinks its hep b or c   Hyperlipidemia    Hypertension    Osteoarthritis    Pancreatitis    Rheumatoid arthritis(714.0)    Stroke (HCC)    Tobacco user     Assessment: Patient is a 68 y.o M with hx EtOH abuse and pancreatitis who presented to the ED on 04/19/24 with c/o abdominal pain.  Abdominal CT on 04/19/24 showed findings with concern for necrotizing pancreatitis and thrombus extending from the SMV into the porta splenic confluence and proximal main portal vein.    Pt was started on heparin  drip for anticoagulation on admission. Heparin  was stopped in the afternoon on 6/28 for hemoptysis, resolved and restarted.  Heparin  was briefly held on 7/1 and then resumed.  Pharmacy consulted to dose apixaban  on 7/4.   7/7: Apixaban  education provided  Today,  05/04/2024: Hemoglobin stable  Goal of Therapy:  Heparin  level 0.3-0.7 units/ml Monitor platelets by anticoagulation protocol: Yes   Plan:  Continue apixaban  5 mg po BID With no dose adjustments anticipated, Pharmacy will sign off consult and continue to monitor CBC as ordered by provider along with signs/symptoms of bleeding.   Thank you for allowing pharmacy to be a part of this patient's care.  Eleanor EMERSON Agent, PharmD, BCPS Clinical Pharmacist [facility] 05/04/2024 7:28 AM

## 2024-05-04 NOTE — Progress Notes (Signed)
 PROGRESS NOTE    [name]  FMW:[id number] DOB: 12-Sep-1956 DOA: 04/19/2024 PCP: [name], NP   Brief Narrative:  68 year old male with history of DM2, HTN, HLD, rheumatoid arthritis, prior CVA, tobacco and alcohol use comes into the hospital and is admitted on 6/23 with abdominal pain, found to have necrotizing pancreatitis. Hospital course complicated by persistent abdominal pain requiring NG tube placement for feeding, also hypoxic respiratory failure requiring high flow oxygen. He was also found to have a thrombus extending from the SMV and the portal splenic confluence. Has been placed on anticoagulation. GI consulted early on but they signed off. Surgery also consulted due to necrotizing pancreatitis   Assessment & Plan:   Principal Problem:   Pancreatitis, alcoholic, acute Active Problems:   Malnutrition of moderate degree   Hemoptysis   Acute hypoxemic respiratory failure (HCC)  Acute necrotizing pancreatitis, EtOH induced -imaging on admission showed acute pancreatitis with findings suggestive of necrotizing pancreatitis.  With no abscess or pseudocyst were seen.  GI consulted, now signed off.  General surgery also consulted, no indication for surgery and they signed off as well.  Continue multimodal pain management and supportive care.  Tolerating regular diet.  Calorie count continues, still on tube feeds.  Dietitian to decide when to stop that.   Acute hypoxic respiratory failure and sepsis secondary to aspiration pneumonia, sepsis not present on admission: - Chest x-ray does not show any significant ARDS, however CT chest completed on 04/25/2024 shows extensive consolidation in the lung bases.  Patient developed fever of 102 again at 11:30 PM on 04/27/2024.  Has been restarted on Rocephin  since 04/26/2024.  He was afebrile for 3 days until late evening of 05/01/2023 when he developed fever of 101.6, This is despite of him receiving Rocephin  and leukocytosis improving.  Blood  culture repeated on 05/01/2024 and is negative.  Was diagnosed with C. difficile, details below for that.  repeat chest x-ray on 05/01/2024 shows unchanged left pleural effusion and possible consolidation which was also unchanged, completed 7 days of Rocephin  on 05/02/2024.  He has been weaned to room air since 04/30/2024.  C. difficile colitis: Started having fever on the evening of 04/30/2024 which persisted and had worsening diarrhea on 05/01/2024 raising concern for C. difficile.  Unfortunately, he was tested positive for that.  Started on vancomycin  however continues to have profuse diarrhea and again he had 7-8 bowel movements in the last 24 hours.  Fortunately he is now afebrile. Last temperature spike at midnight 05/02/2024.   SMV portal splenic thrombus/hemoptysis- This is likely due to pancreatitis.  Thrombus is nonocclusive.  Heparin  was held due to hemoptysis on early morning of 04/27/2024 but resumed later on the same day.  He was monitored closely, did not have any further hemoptysis so he was transition to DOAC on 04/30/2024.  Hemoglobin has remained stable.   Essential hypertension-pretty hypertensive on admission, has been placed on amlodipine , Coreg .  Blood pressure slightly elevated but will not make any changes to the medications due to new fever and risk of hypotension.   EtOH use-no withdrawals noted, status post Librium  taper, out of the window for withdrawal.   Hypokalemia-replaced/resolved.  Mild acute hyponatremia: 131 today.  Monitor for now.   Fatty liver with early liver cirrhosis/chronic diastolic CHF-now being diuresed with IV furosemide .  Does not appear to be much volume overloaded.  Will stop IV Lasix  20 mg twice daily and start on Lasix  40 mg p.o. daily starting today.  Since he  also appears to have fatty liver with early cirrhosis, I will also start him on low-dose Aldactone .  Resume beta-blocker.  RA-does not appear to be on any medications at home   Anemia of critical  illness-hemoglobin fairly stable.  Transfuse if less than 7.   Thrombocytopenia-in the setting of EtOH use however this has resolved.   DM2, with hyperglycemia-currently on 40 units of Lantus  and SSI, blood sugar better controlled now.   DVT prophylaxis: Eliquis    Code Status: Full Code  Family Communication: None present at bedside.  Plan of care discussed with patient in length and he/she verbalized understanding and agreed with it.  Status is: Inpatient Remains inpatient appropriate because: Still with diarrhea    Estimated body mass index is 22.87 kg/m as calculated from the following:   Height as of this encounter: 5' 7 (1.702 m).   Weight as of this encounter: 66.2 kg.    Nutritional Assessment: Body mass index is 22.87 kg/m.SABRA Seen by dietician.  I agree with the assessment and plan as outlined below: Nutrition Status: Nutrition Problem: Moderate Malnutrition Etiology: chronic illness (alcohol abuse; CHF) Signs/Symptoms: mild fat depletion, moderate muscle depletion Interventions: Refer to RD note for recommendations, Tube feeding  . Skin Assessment: I have examined the patient's skin and I agree with the wound assessment as performed by the wound care RN as outlined below:    Consultants:  Critical care  Procedures:  None  Antimicrobials:  Anti-infectives (From admission, onward)    Start     Dose/Rate Route Frequency Ordered Stop   05/01/24 1800  vancomycin  (VANCOCIN ) capsule 125 mg        125 mg Oral 4 times daily 05/01/24 1548 05/11/24 1759   04/26/24 0845  cefTRIAXone  (ROCEPHIN ) 2 g in sodium chloride  0.9 % 100 mL IVPB  Status:  Discontinued        2 g 200 mL/hr over 30 Minutes Intravenous Every 24 hours 04/26/24 0750 05/02/24 1339   04/20/24 1800  meropenem  (MERREM ) 1 g in sodium chloride  0.9 % 100 mL IVPB  Status:  Discontinued        1 g 200 mL/hr over 30 Minutes Intravenous Every 12 hours 04/19/24 1725 04/19/24 1751   04/19/24 1715  meropenem   (MERREM ) 1 g in sodium chloride  0.9 % 100 mL IVPB  Status:  Discontinued        1 g 200 mL/hr over 30 Minutes Intravenous Every 8 hours 04/19/24 1705 04/19/24 1725   04/19/24 1415  cefTRIAXone  (ROCEPHIN ) 2 g in sodium chloride  0.9 % 100 mL IVPB        2 g 200 mL/hr over 30 Minutes Intravenous Once 04/19/24 1404 04/19/24 1458   04/19/24 1415  metroNIDAZOLE  (FLAGYL ) IVPB 500 mg        500 mg 100 mL/hr over 60 Minutes Intravenous  Once 04/19/24 1404 04/19/24 1521   04/19/24 1345  vancomycin  (VANCOREADY) IVPB 1500 mg/300 mL  Status:  Discontinued        1,500 mg 150 mL/hr over 120 Minutes Intravenous  Once 04/19/24 1333 04/19/24 1403   04/19/24 1330  ceFEPIme  (MAXIPIME ) 2 g in sodium chloride  0.9 % 100 mL IVPB  Status:  Discontinued        2 g 200 mL/hr over 30 Minutes Intravenous  Once 04/19/24 1326 04/19/24 1403   04/19/24 1330  metroNIDAZOLE  (FLAGYL ) IVPB 500 mg  Status:  Discontinued        500 mg 100 mL/hr over 60 Minutes Intravenous  Once 04/19/24 1326 04/19/24 1403   04/19/24 1330  vancomycin  (VANCOCIN ) IVPB 1000 mg/200 mL premix  Status:  Discontinued        1,000 mg 200 mL/hr over 60 Minutes Intravenous  Once 04/19/24 1326 04/19/24 1332         Subjective: Seen and examined.  Continues to have diarrhea, has had several bowel movements in the last 24 hours.  Intermittent abdominal pain which is chronic for him now.  No other complaint.  Objective: Vitals:   05/03/24 1355 05/03/24 2004 05/04/24 0355 05/04/24 0424  BP: (!) 143/60 (!) 149/67 134/61   Pulse: 80 85 78   Resp:  20 20   Temp: 98.1 F (36.7 C) 97.9 F (36.6 C) 98.2 F (36.8 C)   TempSrc: Oral Oral Oral   SpO2: 98% 100% 97%   Weight:    66.2 kg  Height:        Intake/Output Summary (Last 24 hours) at 05/04/2024 0758 Last data filed at 05/04/2024 9379 Gross per 24 hour  Intake 1932.42 ml  Output 1350 ml  Net 582.42 ml   Filed Weights   05/02/24 0500 05/03/24 0420 05/04/24 0424  Weight: 68.8 kg 68.2 kg 66.2  kg    Examination:  General exam: Appears calm and comfortable, appears chronically sick Respiratory system: Clear to auscultation. Respiratory effort normal. Cardiovascular system: S1 & S2 heard, RRR. No JVD, murmurs, rubs, gallops or clicks. No pedal edema. Gastrointestinal system: Abdomen is nondistended, soft and nontender. No organomegaly or masses felt. Normal bowel sounds heard. Central nervous system: Alert and oriented. No focal neurological deficits. Extremities: Symmetric 5 x 5 power. Skin: No rashes, lesions or ulcers.  Psychiatry: Judgement and insight appear normal. Mood & affect appropriate.   Data Reviewed: I have personally reviewed following labs and imaging studies  CBC: Recent Labs  Lab 04/29/24 0534 04/30/24 0623 05/01/24 0218 05/02/24 0241 05/03/24 0345  WBC 20.8* 24.5* 21.1* 19.9* 17.8*  HGB 8.5* 9.0* 8.3* 8.3* 8.1*  HCT 25.4* 26.4* 24.9* 25.1* 24.0*  MCV 98.4 98.5 97.3 97.3 98.0  PLT 342 446* 442* 508* 560*   Basic Metabolic Panel: Recent Labs  Lab 04/28/24 0102 04/30/24 0623 05/01/24 0218 05/02/24 0857  NA 128* 130* 131* 131*  K 3.8 3.6 3.3* 4.9  CL 90* 89* 93* 92*  CO2 27 27 25 23   GLUCOSE 154* 168* 227* 213*  BUN 17 16 16 13   CREATININE 0.81 0.85 0.79 0.72  CALCIUM  8.3* 9.0 9.1 9.0   GFR: Estimated Creatinine Clearance: 83.8 mL/min (by C-G formula based on SCr of 0.72 mg/dL). Liver Function Tests: Recent Labs  Lab 04/28/24 0102 04/30/24 0623 05/01/24 0218  AST 47* 46* 29  ALT 40 43 32  ALKPHOS 99 109 99  BILITOT 0.9 0.7 0.6  PROT 6.5 7.1 6.7  ALBUMIN  2.2* 2.3* 2.2*   No results for input(s): LIPASE, AMYLASE in the last 168 hours.  No results for input(s): AMMONIA in the last 168 hours. Coagulation Profile: No results for input(s): INR, PROTIME in the last 168 hours. Cardiac Enzymes: No results for input(s): CKTOTAL, CKMB, CKMBINDEX, TROPONINI in the last 168 hours. BNP (last 3 results) No results for  input(s): PROBNP in the last 8760 hours. HbA1C: No results for input(s): HGBA1C in the last 72 hours. CBG: Recent Labs  Lab 05/03/24 1702 05/03/24 2002 05/03/24 2354 05/04/24 0353 05/04/24 0719  GLUCAP 326* 271* 176* 191* 165*   Lipid Profile: No results for input(s): CHOL, HDL, LDLCALC, TRIG,  CHOLHDL, LDLDIRECT in the last 72 hours. Thyroid Function Tests: No results for input(s): TSH, T4TOTAL, FREET4, T3FREE, THYROIDAB in the last 72 hours. Anemia Panel: No results for input(s): VITAMINB12, FOLATE, FERRITIN, TIBC, IRON, RETICCTPCT in the last 72 hours. Sepsis Labs: No results for input(s): PROCALCITON, LATICACIDVEN in the last 168 hours.  Recent Results (from the past 240 hours)  Culture, blood (Routine X 2) w Reflex to ID Panel     Status: None   Collection Time: 04/26/24  5:41 AM   Specimen: BLOOD  Result Value Ref Range Status   Specimen Description   Final    BLOOD BLOOD RIGHT ARM Performed at [facility], [address] [place], [place] [address]    Special Requests   Final    BOTTLES DRAWN AEROBIC ONLY Blood Culture adequate volume Performed at [facility], [address], [place], [place] [address]    Culture   Final    NO GROWTH 5 DAYS Performed at [facility], [address], [place], [place] [address]    Report Status 05/01/2024 FINAL  Final  Culture, blood (Routine X 2) w Reflex to ID Panel     Status: None   Collection Time: 04/26/24  5:41 AM   Specimen: BLOOD  Result Value Ref Range Status   Specimen Description   Final    BLOOD BLOOD RIGHT ARM Performed at [facility], [address] [place], [place] [address]    Special Requests   Final    BOTTLES DRAWN AEROBIC ONLY Blood Culture adequate volume Performed at [facility], [address] [place], [place] [address]    Culture   Final    NO GROWTH 5 DAYS Performed at  [facility], [address] [place], [place] [address]    Report Status 05/01/2024 FINAL  Final  Culture, blood (Routine X 2) w Reflex to ID Panel     Status: None (Preliminary result)   Collection Time: 05/01/24  9:01 AM   Specimen: BLOOD  Result Value Ref Range Status   Specimen Description   Final    BLOOD BLOOD RIGHT ARM Performed at [facility], [address] [place], [place] [address]    Special Requests   Final    BOTTLES DRAWN AEROBIC AND ANAEROBIC Blood Culture results may not be optimal due to an inadequate volume of blood received in culture bottles Performed at [facility], [address], [place], [place] [address]    Culture   Final    NO GROWTH 3 DAYS Performed at [facility], [address] [place], [place] [address]    Report Status PENDING  Incomplete  Culture, blood (Routine X 2) w Reflex to ID Panel     Status: None (Preliminary result)   Collection Time: 05/01/24  9:01 AM   Specimen: BLOOD  Result Value Ref Range Status   Specimen Description   Final    BLOOD BLOOD RIGHT HAND Performed at [facility], [address], [place], [place] [address]    Special Requests   Final    BOTTLES DRAWN AEROBIC AND ANAEROBIC Blood Culture results may not be optimal due to an inadequate volume of blood received in culture bottles Performed at [facility], [address] [place], [place] [address]    Culture   Final    NO GROWTH 3 DAYS Performed at [facility],  [address] [place], [place] [address]    Report Status PENDING  Incomplete  C Difficile Quick Screen (NO PCR Reflex)     Status: Abnormal   Collection Time: 05/01/24  3:04 PM   Specimen: STOOL  Result Value Ref Range Status   C Diff antigen POSITIVE (A) NEGATIVE Final   C Diff toxin POSITIVE (A) NEGATIVE Final   C Diff interpretation Toxin producing C. difficile detected.  Final    Comment: CRITICAL  RESULT CALLED TO, READ BACK BY AND VERIFIED WITH: JOESEPH LABOR RN AT 1543 ON 05/01/2024 BY PRUDY POUR Performed at [facility], [address] [place], [place] [address]      Radiology Studies: No results found.   Scheduled Meds:  (feeding supplement) PROSource Plus  30 mL Oral TID BM   amLODipine   10 mg Oral Daily   apixaban   5 mg Oral BID   carvedilol   12.5 mg Oral BID WC   Chlorhexidine  Gluconate Cloth  6 each Topical Daily   feeding supplement  1 Container Oral TID BM   feeding supplement (OSMOLITE 1.5 CAL)  660 mL Per Tube Q24H   folic acid   1 mg Intravenous Daily   furosemide   40 mg Oral Daily   insulin  aspart  0-15 Units Subcutaneous Q4H   insulin  aspart  6 Units Subcutaneous Q4H   insulin  glargine-yfgn  50 Units Subcutaneous Daily   multivitamin  15 mL Per Tube Daily   nicotine   21 mg Transdermal Daily   spironolactone   25 mg Oral Daily   tamsulosin   0.8 mg Oral Daily   thiamine   100 mg Oral Daily   Or   thiamine   100 mg Intravenous Daily   vancomycin   125 mg Oral QID   Continuous Infusions:     LOS: 15 days   Fredia Skeeter, MD [facility]  05/04/2024, 7:58 AM   *Please note that this is a verbal dictation therefore any spelling or grammatical errors are due to the Dragon Medical One system interpretation.  Please page via Amion and do not message via secure chat for urgent patient care matters. Secure chat can be used for non urgent patient care matters.  How to contact the [facility] Attending or Consulting provider 7A - 7P or covering provider during after hours 7P -7A, for this patient?  Check the care team in [facility] and look for a) attending/consulting [facility] provider listed and b) the [facility] team listed. Page or secure chat 7A-7P. Log into [email or web address] and use [facility] universal password to access. If you do not have the password, please contact the hospital operator. Locate the [facility] provider you are looking for under [facility] and  page to a number that you can be directly reached. If you still have difficulty reaching the provider, please page the [facility] (Director on Call) for the Hospitalists listed on amion for assistance.

## 2024-05-05 DIAGNOSIS — K8521 Alcohol induced acute pancreatitis with uninfected necrosis: Secondary | ICD-10-CM | POA: Diagnosis not present

## 2024-05-05 LAB — CBC WITH DIFFERENTIAL/PLATELET
Abs Immature Granulocytes: 0.13 K/uL — ABNORMAL HIGH (ref 0.00–0.07)
Basophils Absolute: 0.1 K/uL (ref 0.0–0.1)
Basophils Relative: 0 %
Eosinophils Absolute: 0.2 K/uL (ref 0.0–0.5)
Eosinophils Relative: 1 %
HCT: 28.3 % — ABNORMAL LOW (ref 39.0–52.0)
Hemoglobin: 9.1 g/dL — ABNORMAL LOW (ref 13.0–17.0)
Immature Granulocytes: 1 %
Lymphocytes Relative: 26 %
Lymphs Abs: 3.6 K/uL (ref 0.7–4.0)
MCH: 32.7 pg (ref 26.0–34.0)
MCHC: 32.2 g/dL (ref 30.0–36.0)
MCV: 101.8 fL — ABNORMAL HIGH (ref 80.0–100.0)
Monocytes Absolute: 1.8 K/uL — ABNORMAL HIGH (ref 0.1–1.0)
Monocytes Relative: 13 %
Neutro Abs: 8.3 K/uL — ABNORMAL HIGH (ref 1.7–7.7)
Neutrophils Relative %: 59 %
Platelets: 688 K/uL — ABNORMAL HIGH (ref 150–400)
RBC: 2.78 MIL/uL — ABNORMAL LOW (ref 4.22–5.81)
RDW: 12.4 % (ref 11.5–15.5)
WBC: 14 K/uL — ABNORMAL HIGH (ref 4.0–10.5)
nRBC: 0.1 % (ref 0.0–0.2)

## 2024-05-05 LAB — BASIC METABOLIC PANEL WITH GFR
Anion gap: 13 (ref 5–15)
BUN: 14 mg/dL (ref 8–23)
CO2: 22 mmol/L (ref 22–32)
Calcium: 9.5 mg/dL (ref 8.9–10.3)
Chloride: 98 mmol/L (ref 98–111)
Creatinine, Ser: 0.64 mg/dL (ref 0.61–1.24)
GFR, Estimated: >60 mL/min (ref >60)
Glucose, Bld: 135 mg/dL — ABNORMAL HIGH (ref 70–99)
Potassium: 4 mmol/L (ref 3.5–5.1)
Sodium: 133 mmol/L — ABNORMAL LOW (ref 135–145)

## 2024-05-05 LAB — GLUCOSE, CAPILLARY
Glucose-Capillary: 114 mg/dL — ABNORMAL HIGH (ref 70–99)
Glucose-Capillary: 124 mg/dL — ABNORMAL HIGH (ref 70–99)
Glucose-Capillary: 129 mg/dL — ABNORMAL HIGH (ref 70–99)
Glucose-Capillary: 138 mg/dL — ABNORMAL HIGH (ref 70–99)
Glucose-Capillary: 165 mg/dL — ABNORMAL HIGH (ref 70–99)
Glucose-Capillary: 84 mg/dL (ref 70–99)

## 2024-05-05 MED ORDER — TRAZODONE HCL 50 MG PO TABS
25.0000 mg | ORAL_TABLET | Freq: Every evening | ORAL | Status: DC | PRN
  Administered 2024-05-05: 25 mg via ORAL
  Filled 2024-05-05: qty 1

## 2024-05-05 MED ORDER — ENSURE MAX PROTEIN PO LIQD
11.0000 [oz_av] | Freq: Two times a day (BID) | ORAL | Status: DC
  Administered 2024-05-05 – 2024-05-06 (×2): 11 [oz_av] via ORAL
  Filled 2024-05-05 (×3): qty 330

## 2024-05-05 MED ORDER — OXYCODONE HCL 5 MG PO TABS
10.0000 mg | ORAL_TABLET | Freq: Four times a day (QID) | ORAL | Status: DC | PRN
  Administered 2024-05-05 – 2024-05-06 (×4): 10 mg via ORAL
  Filled 2024-05-05 (×5): qty 2

## 2024-05-05 MED ORDER — FOLIC ACID 1 MG PO TABS
1.0000 mg | ORAL_TABLET | Freq: Every day | ORAL | Status: DC
  Administered 2024-05-05 – 2024-05-06 (×2): 1 mg via ORAL
  Filled 2024-05-05 (×2): qty 1

## 2024-05-05 NOTE — Progress Notes (Addendum)
 PROGRESS NOTE    [name]  FMW:[id number] DOB: Oct 02, 1956 DOA: 04/19/2024 PCP: [name], NP   Brief Narrative:  68 year old male with history of DM2, HTN, HLD, rheumatoid arthritis, prior CVA, tobacco and alcohol use comes into the hospital and is admitted on 6/23 with abdominal pain, found to have necrotizing pancreatitis. Hospital course complicated by persistent abdominal pain requiring NG tube placement for feeding, also hypoxic respiratory failure requiring high flow oxygen. He was also found to have a thrombus extending from the SMV and the portal splenic confluence. Has been placed on anticoagulation. GI consulted early on but they signed off. Surgery also consulted due to necrotizing pancreatitis   Assessment & Plan:   Principal Problem:   Pancreatitis, alcoholic, acute Active Problems:   Malnutrition of moderate degree   Hemoptysis   Acute hypoxemic respiratory failure (HCC)  Acute necrotizing pancreatitis, EtOH induced -imaging on admission showed acute pancreatitis with findings suggestive of necrotizing pancreatitis.  With no abscess or pseudocyst were seen.  GI consulted, now signed off.  General surgery also consulted, no indication for surgery and they signed off as well.  Continue multimodal pain management and supportive care.  Tolerating regular diet.  Calorie count continues, still on tube feeds.  Per dietitian, he is diet intake is adequate and possibly tube feeds will be stopped today.   Acute hypoxic respiratory failure and sepsis secondary to aspiration pneumonia, sepsis not present on admission: - Chest x-ray does not show any significant ARDS, however CT chest completed on 04/25/2024 shows extensive consolidation in the lung bases.  Patient developed fever of 102 again at 11:30 PM on 04/27/2024.  Has been restarted on Rocephin  since 04/26/2024.  He was afebrile for 3 days until late evening of 05/01/2023 when he developed fever of 101.6, This is despite of him  receiving Rocephin  and leukocytosis improving.  Blood culture repeated on 05/01/2024 and is negative.  Was diagnosed with C. difficile, details below for that.  repeat chest x-ray on 05/01/2024 shows unchanged left pleural effusion and possible consolidation which was also unchanged, completed 7 days of Rocephin  on 05/02/2024.  He has been weaned to room air since 04/30/2024.  C. difficile colitis: Started having fever on the evening of 04/30/2024 which persisted and had worsening diarrhea on 05/01/2024 raising concern for C. difficile.  Unfortunately, he was tested positive for that.  Started on vancomycin  but continued to have profuse diarrhea.  Finally looks like he has had 2-3 bowel movements in the last 24 hours.  Fortunately he is now afebrile. Last temperature spike at midnight 05/02/2024.   SMV portal splenic thrombus/hemoptysis- This is likely due to pancreatitis.  Thrombus is nonocclusive.  Heparin  was held due to hemoptysis on early morning of 04/27/2024 but resumed later on the same day.  He was monitored closely, did not have any further hemoptysis so he was transition to DOAC on 04/30/2024.  Hemoglobin has remained stable.   Essential hypertension-pretty hypertensive on admission, has been placed on amlodipine , Coreg .  Blood pressure slightly elevated but will not make any changes to the medications due to new fever and risk of hypotension.   EtOH use-no withdrawals noted, status post Librium  taper, out of the window for withdrawal.   Hypokalemia-replaced/resolved.  Mild acute hyponatremia: 131 today.  Monitor for now.   Fatty liver with early liver cirrhosis/chronic diastolic CHF-now being diuresed with IV furosemide .  Does not appear to be much volume overloaded.  Will stop IV Lasix  20 mg twice daily and  start on Lasix  40 mg p.o. daily starting today.  Since he also appears to have fatty liver with early cirrhosis, I will also start him on low-dose Aldactone .  Resume beta-blocker.  RA-does not appear  to be on any medications at home   Anemia of critical illness-hemoglobin fairly stable.  Transfuse if less than 7.   Thrombocytopenia-in the setting of EtOH use however this has resolved.   DM2, with hyperglycemia-currently on 50 units of Lantus  and SSI, blood sugar better controlled now.   DVT prophylaxis: Eliquis    Code Status: Full Code  Family Communication: None present at bedside.  Plan of care discussed with patient in length and he/she verbalized understanding and agreed with it.  Status is: Inpatient Remains inpatient appropriate because: Will be ready for discharge tomorrow.   Estimated body mass index is 22.91 kg/m as calculated from the following:   Height as of this encounter: 5' 7 (1.702 m).   Weight as of this encounter: 66.4 kg.    Nutritional Assessment: Body mass index is 22.91 kg/m.SABRA Seen by dietician.  I agree with the assessment and plan as outlined below: Nutrition Status: Nutrition Problem: Moderate Malnutrition Etiology: chronic illness (alcohol abuse; CHF) Signs/Symptoms: mild fat depletion, moderate muscle depletion Interventions: Refer to RD note for recommendations, Tube feeding  . Skin Assessment: I have examined the patient's skin and I agree with the wound assessment as performed by the wound care RN as outlined below:    Consultants:  Critical care  Procedures:  None  Antimicrobials:  Anti-infectives (From admission, onward)    Start     Dose/Rate Route Frequency Ordered Stop   05/01/24 1800  vancomycin  (VANCOCIN ) capsule 125 mg        125 mg Oral 4 times daily 05/01/24 1548 05/11/24 1759   04/26/24 0845  cefTRIAXone  (ROCEPHIN ) 2 g in sodium chloride  0.9 % 100 mL IVPB  Status:  Discontinued        2 g 200 mL/hr over 30 Minutes Intravenous Every 24 hours 04/26/24 0750 05/02/24 1339   04/20/24 1800  meropenem  (MERREM ) 1 g in sodium chloride  0.9 % 100 mL IVPB  Status:  Discontinued        1 g 200 mL/hr over 30 Minutes Intravenous  Every 12 hours 04/19/24 1725 04/19/24 1751   04/19/24 1715  meropenem  (MERREM ) 1 g in sodium chloride  0.9 % 100 mL IVPB  Status:  Discontinued        1 g 200 mL/hr over 30 Minutes Intravenous Every 8 hours 04/19/24 1705 04/19/24 1725   04/19/24 1415  cefTRIAXone  (ROCEPHIN ) 2 g in sodium chloride  0.9 % 100 mL IVPB        2 g 200 mL/hr over 30 Minutes Intravenous Once 04/19/24 1404 04/19/24 1458   04/19/24 1415  metroNIDAZOLE  (FLAGYL ) IVPB 500 mg        500 mg 100 mL/hr over 60 Minutes Intravenous  Once 04/19/24 1404 04/19/24 1521   04/19/24 1345  vancomycin  (VANCOREADY) IVPB 1500 mg/300 mL  Status:  Discontinued        1,500 mg 150 mL/hr over 120 Minutes Intravenous  Once 04/19/24 1333 04/19/24 1403   04/19/24 1330  ceFEPIme  (MAXIPIME ) 2 g in sodium chloride  0.9 % 100 mL IVPB  Status:  Discontinued        2 g 200 mL/hr over 30 Minutes Intravenous  Once 04/19/24 1326 04/19/24 1403   04/19/24 1330  metroNIDAZOLE  (FLAGYL ) IVPB 500 mg  Status:  Discontinued  500 mg 100 mL/hr over 60 Minutes Intravenous  Once 04/19/24 1326 04/19/24 1403   04/19/24 1330  vancomycin  (VANCOCIN ) IVPB 1000 mg/200 mL premix  Status:  Discontinued        1,000 mg 200 mL/hr over 60 Minutes Intravenous  Once 04/19/24 1326 04/19/24 1332         Subjective: Patient seen and examined.  He has no complaints.  He confirms that he has had 2 bowel movements in last 24 hours and that he is eating good amount of meals.  Objective: Vitals:   05/04/24 1038 05/04/24 2027 05/05/24 0344 05/05/24 0845  BP: 132/68 134/71 (!) 149/65   Pulse: 88 80 79   Resp:      Temp: 98.4 F (36.9 C) 98.2 F (36.8 C) 97.8 F (36.6 C) 97.7 F (36.5 C)  TempSrc: Oral Oral Oral Oral  SpO2:  98% 98%   Weight:   66.4 kg   Height:        Intake/Output Summary (Last 24 hours) at 05/05/2024 1103 Last data filed at 05/05/2024 0617 Gross per 24 hour  Intake 740.25 ml  Output 650 ml  Net 90.25 ml   Filed Weights   05/03/24 0420  05/04/24 0424 05/05/24 0344  Weight: 68.2 kg 66.2 kg 66.4 kg    Examination:  General exam: Appears calm and comfortable, appears chronically sick Respiratory system: Clear to auscultation. Respiratory effort normal. Cardiovascular system: S1 & S2 heard, RRR. No JVD, murmurs, rubs, gallops or clicks. No pedal edema. Gastrointestinal system: Abdomen is nondistended, soft and nontender. No organomegaly or masses felt. Normal bowel sounds heard. Central nervous system: Alert and oriented. No focal neurological deficits. Extremities: Symmetric 5 x 5 power. Skin: No rashes, lesions or ulcers.  Psychiatry: Judgement and insight appear normal. Mood & affect appropriate.   Data Reviewed: I have personally reviewed following labs and imaging studies  CBC: Recent Labs  Lab 04/30/24 0623 05/01/24 0218 05/02/24 0241 05/03/24 0345 05/05/24 0359  WBC 24.5* 21.1* 19.9* 17.8* 14.0*  NEUTROABS  --   --   --   --  8.3*  HGB 9.0* 8.3* 8.3* 8.1* 9.1*  HCT 26.4* 24.9* 25.1* 24.0* 28.3*  MCV 98.5 97.3 97.3 98.0 101.8*  PLT 446* 442* 508* 560* 688*   Basic Metabolic Panel: Recent Labs  Lab 04/30/24 0623 05/01/24 0218 05/02/24 0857 05/04/24 1436 05/05/24 0359  NA 130* 131* 131* 131* 133*  K 3.6 3.3* 4.9 4.2 4.0  CL 89* 93* 92* 96* 98  CO2 27 25 23 23 22   GLUCOSE 168* 227* 213* 172* 135*  BUN 16 16 13 11 14   CREATININE 0.85 0.79 0.72 0.58* 0.64  CALCIUM  9.0 9.1 9.0 9.1 9.5   GFR: Estimated Creatinine Clearance: 83.8 mL/min (by C-G formula based on SCr of 0.64 mg/dL). Liver Function Tests: Recent Labs  Lab 04/30/24 0623 05/01/24 0218  AST 46* 29  ALT 43 32  ALKPHOS 109 99  BILITOT 0.7 0.6  PROT 7.1 6.7  ALBUMIN  2.3* 2.2*   No results for input(s): LIPASE, AMYLASE in the last 168 hours.  No results for input(s): AMMONIA in the last 168 hours. Coagulation Profile: No results for input(s): INR, PROTIME in the last 168 hours. Cardiac Enzymes: No results for input(s):  CKTOTAL, CKMB, CKMBINDEX, TROPONINI in the last 168 hours. BNP (last 3 results) No results for input(s): PROBNP in the last 8760 hours. HbA1C: No results for input(s): HGBA1C in the last 72 hours. CBG: Recent Labs  Lab 05/04/24  1641 05/04/24 2027 05/04/24 2353 05/05/24 0351 05/05/24 0742  GLUCAP 109* 157* 169* 138* 124*   Lipid Profile: No results for input(s): CHOL, HDL, LDLCALC, TRIG, CHOLHDL, LDLDIRECT in the last 72 hours. Thyroid Function Tests: No results for input(s): TSH, T4TOTAL, FREET4, T3FREE, THYROIDAB in the last 72 hours. Anemia Panel: No results for input(s): VITAMINB12, FOLATE, FERRITIN, TIBC, IRON, RETICCTPCT in the last 72 hours. Sepsis Labs: No results for input(s): PROCALCITON, LATICACIDVEN in the last 168 hours.  Recent Results (from the past 240 hours)  Culture, blood (Routine X 2) w Reflex to ID Panel     Status: None   Collection Time: 04/26/24  5:41 AM   Specimen: BLOOD  Result Value Ref Range Status   Specimen Description   Final    BLOOD BLOOD RIGHT ARM Performed at [facility], [address], [place], [place] [address]    Special Requests   Final    BOTTLES DRAWN AEROBIC ONLY Blood Culture adequate volume Performed at [facility], [address] [place], [place] [address]    Culture   Final    NO GROWTH 5 DAYS Performed at [facility], [address] [place], [place] [address]    Report Status 05/01/2024 FINAL  Final  Culture, blood (Routine X 2) w Reflex to ID Panel     Status: None   Collection Time: 04/26/24  5:41 AM   Specimen: BLOOD  Result Value Ref Range Status   Specimen Description   Final    BLOOD BLOOD RIGHT ARM Performed at [facility], [address] [place], [place] [address]    Special Requests   Final    BOTTLES DRAWN AEROBIC ONLY Blood Culture adequate volume Performed at [facility], [address], [place], [place] [address]    Culture   Final    NO GROWTH 5 DAYS Performed at [facility], [address], [place], [place] [address]    Report Status 05/01/2024 FINAL  Final  Culture, blood (Routine X 2) w Reflex to ID Panel     Status: None (Preliminary result)   Collection Time: 05/01/24  9:01 AM   Specimen: BLOOD  Result Value Ref Range Status   Specimen Description   Final    BLOOD BLOOD RIGHT ARM Performed at [facility], [address], [place], [place] [address]    Special Requests   Final    BOTTLES DRAWN AEROBIC AND ANAEROBIC Blood Culture results may not be optimal due to an inadequate volume of blood received in culture bottles Performed at [facility], [address], [place], [place] [address]    Culture   Final    NO GROWTH 4 DAYS Performed at [facility], [address], [place], [place] [address]    Report Status PENDING  Incomplete  Culture, blood (Routine X 2) w Reflex to ID Panel     Status: None (Preliminary result)   Collection Time: 05/01/24  9:01 AM   Specimen: BLOOD  Result Value Ref Range Status   Specimen Description   Final    BLOOD BLOOD RIGHT HAND Performed at [facility], [address], [place], [place] [address]    Special Requests   Final    BOTTLES DRAWN AEROBIC AND ANAEROBIC Blood Culture results may not be optimal due to an inadequate volume of blood received in culture bottles Performed at [facility], [address] [place], [place] [address]    Culture   Final    NO GROWTH 4 DAYS Performed at [facility], [address] [place], [place] [address]    Report Status PENDING  Incomplete  C Difficile Quick Screen (NO PCR Reflex)     Status: Abnormal   Collection Time: 05/01/24  3:04 PM   Specimen: STOOL  Result Value Ref Range Status   C Diff antigen POSITIVE (A) NEGATIVE Final   C Diff toxin POSITIVE (A)  NEGATIVE Final   C Diff interpretation Toxin producing C. difficile detected.  Final    Comment: CRITICAL RESULT CALLED TO, READ BACK BY AND VERIFIED WITH: JOESEPH LABOR RN AT 1543 ON 05/01/2024 BY PRUDY POUR Performed at [facility], [address], [place], [place] [address]      Radiology Studies: No results found.   Scheduled Meds:  (feeding supplement) PROSource Plus  30 mL Oral TID BM   amLODipine   10 mg Oral Daily   apixaban   5 mg Oral BID   carvedilol   12.5 mg Oral BID WC   Chlorhexidine  Gluconate Cloth  6 each Topical Daily   feeding supplement  1 Container Oral TID BM   feeding supplement (OSMOLITE 1.5 CAL)  660 mL Per Tube Q24H   folic acid   1 mg Oral Daily   furosemide   40 mg Oral Daily   insulin  aspart  0-15 Units Subcutaneous Q4H   insulin  aspart  6 Units Subcutaneous Q4H   insulin  glargine-yfgn  50 Units Subcutaneous Daily   multivitamin  15 mL Per Tube Daily   nicotine   21 mg Transdermal Daily   Ensure Max Protein  11 oz Oral Daily   spironolactone   25 mg Oral Daily   tamsulosin   0.8 mg Oral Daily   thiamine   100 mg Oral Daily   vancomycin   125 mg Oral QID   Continuous Infusions:     LOS: 16 days   Fredia Skeeter, MD [facility]  05/05/2024, 11:03 AM   *Please note that this is a verbal dictation therefore any spelling or grammatical errors are due to the Dragon Medical One system interpretation.  Please page via Amion and do not message via secure chat for urgent patient care matters. Secure chat can be used for non urgent patient care matters.  How to contact the [facility] Attending or Consulting provider 7A - 7P or covering provider during after hours 7P -7A, for this patient?  Check the care team in [facility] and look for a) attending/consulting [facility] provider listed and b) the [facility] team listed. Page or secure chat 7A-7P. Log into [email or web address] and use [facility]'s universal password to access. If you do not have the password, please contact  the hospital operator. Locate the [facility] provider you are looking for under [facility] and page to a number that you can be directly reached. If you still have difficulty reaching the provider, please page the [facility] (Director on Call) for the Hospitalists listed on amion for assistance.

## 2024-05-05 NOTE — Progress Notes (Signed)
 Calorie Count Note  48 hour calorie count ordered.  Diet: Carb modified Supplements: Ensure MAX Protein po daily, each supplement provides 150 kcal and 30 grams of protein   7/8 Breakfast: 100% = 415 kcals, 8g protein Lunch: 227 kcals, 11g protein Dinner: did not consume, reports since his tube feeds were running Supplements: Ensure Max = 150 kcals, 30g protein  Total intake: 792 kcal (41% of minimum estimated needs)  49g protein (54% of minimum estimated needs)  Nutrition Dx: Moderate Malnutrition related to chronic illness (alcohol abuse; CHF) as evidenced by mild fat depletion, moderate muscle depletion    Goal: Patient will meet greater than or equal to 90% of their needs   Intervention:  -Continue to encourage adequate PO intake -Will increase to Ensure Max BID, as pt likes them -D/c Boost Breeze and Prosource as isn't accepting -d/c Calorie Count -Recommended continue daily MVI or B complex w/ C at home  Morna Lee, MS, RD, LDN Inpatient Clinical Dietitian Contact via Secure chat

## 2024-05-05 NOTE — Progress Notes (Signed)
 PHARMACIST - PHYSICIAN COMMUNICATION  IV to Oral Route Change Policy  RECOMMENDATION: This patient is receiving folic acid  and has an active order for thiamine  by the intravenous route.  Based on criteria approved by the Pharmacy and Therapeutics Committee, the intravenous medication(s) is/are being converted to the equivalent oral dose form(s).   DESCRIPTION: These criteria include: The patient is eating (either orally or via tube) and/or has been taking other orally administered medications for a least 24 hours The patient has no evidence of active gastrointestinal bleeding or impaired GI absorption (gastrectomy, short bowel, patient on TNA or NPO).  If you have questions about this conversion, please contact the Pharmacy Department  []   [phone] )  [facility] []   [phone] )  [facility] []   [phone] )  [facility] []   [phone] )  [facility] [x]   [phone] )  [facility]   Felicity, [facility] 05/05/2024 8:11 AM

## 2024-05-05 NOTE — Discharge Summary (Signed)
 Physician Discharge Summary  [name] FMW:[id number] DOB: 1956/03/07 DOA: 04/19/2024  PCP: [name], NP  Admit date: 04/19/2024 Discharge date: 05/05/2024 30 Day Unplanned Readmission Risk Score    Flowsheet Row ED to Hosp-Admission (Current) from 04/19/2024 in [facility]TH FLOOR PROGRESSIVE CARE AND UROLOGY  30 Day Unplanned Readmission Risk Score (%) 24.24 Filed at 05/05/2024 0801    This score is the patient's risk of an unplanned readmission within 30 days of being discharged (0 -100%). The score is based on dignosis, age, lab data, medications, orders, and past utilization.   Low:  0-14.9   Medium: 15-21.9   High: 22-29.9   Extreme: 30 and above          Admitted From: Home Disposition: SNF  Recommendations for Outpatient Follow-up:  Follow up with PCP in 1-2 weeks Please obtain BMP/CBC in one week Follow-up with GI for further care of liver cirrhosis Follow-up with cardiology to establish care for cardiac issues. Please follow up with your PCP on the following pending results: Unresulted Labs (From admission, onward)    None         Home Health: None Equipment/Devices: None  Discharge Condition: Stable CODE STATUS: Full code Diet recommendation: Low-sodium  Subjective: Seen and examined.  Feels well.  No complaints.  Excited about going to rehab.  Brief/Interim Summary: 68 year old male with history of DM2, HTN, HLD, rheumatoid arthritis, prior CVA, tobacco and alcohol use comes into the hospital and is admitted on 6/23 with abdominal pain, found to have necrotizing pancreatitis. Hospital course complicated by persistent abdominal pain requiring NG tube placement for feeding, also hypoxic respiratory failure requiring high flow oxygen. He was also found to have a thrombus extending from the SMV and the portal splenic confluence. Has been placed on anticoagulation. GI consulted early on but they signed off. Surgery also consulted due to necrotizing  pancreatitis but no procedure was required.  Patient was being managed in ICU by critical care and then was transitioned under hospitalist care on 04/25/2024.  Further details of the hospitalization as below.   Acute necrotizing pancreatitis, EtOH induced -imaging on admission showed acute pancreatitis with findings suggestive of necrotizing pancreatitis.  With no abscess or pseudocyst were seen.  GI consulted, now signed off.  General surgery also consulted, no indication for surgery and they signed off as well.  Patient's pain is very well-controlled.  Patient was dependent on tube feeds for several days.  He has been eating very well for last few days, tube feed was stopped yesterday 05/05/2024 and NG tube was removed.  Patient encouraged to supplement with protein shakes.   Acute hypoxic respiratory failure and sepsis secondary to aspiration pneumonia, sepsis not present on admission: - Chest x-ray does not show any significant ARDS, however CT chest completed on 04/25/2024 shows extensive consolidation in the lung bases.  Patient developed fever of 102 again at 11:30 PM on 04/27/2024.  Has been restarted on Rocephin  since 04/26/2024.  He was afebrile for 3 days until late evening of 05/01/2023 when he developed fever of 101.6, This is despite of him receiving Rocephin  and leukocytosis improving.  Blood culture repeated on 05/01/2024 and is negative.  Was diagnosed with C. difficile, details below for that.  repeat chest x-ray on 05/01/2024 shows unchanged left pleural effusion and possible consolidation which was also unchanged, completed 7 days of Rocephin  on 05/02/2024.  He has been weaned to room air since 04/30/2024.   C. difficile colitis: Started having fever  on the evening of 04/30/2024 which persisted and had worsening diarrhea on 05/01/2024 raising concern for C. difficile.  Unfortunately, he was tested positive for that.  Started on vancomycin  but continued to have profuse diarrhea.  Finally looks like he has had  2-3 bowel movements in the last 24 hours.  Fortunately he is now afebrile. Last temperature spike at midnight 05/02/2024.  Has been having 2-3 bowel movements a day.  Completed few days of vancomycin , discharging on 6 more days.   SMV portal splenic thrombus/hemoptysis- This is likely due to pancreatitis.  Thrombus is nonocclusive.  Heparin  was held due to hemoptysis on early morning of 04/27/2024 but resumed later on the same day.  He was monitored closely, did not have any further hemoptysis so he was transition to DOAC on 04/30/2024.  Hemoglobin has remained stable.   Essential hypertension-pretty hypertensive on admission, has been placed on amlodipine , Coreg .  Blood pressure very well-controlled.   EtOH use-no withdrawals noted, status post Librium  taper, out of the window for withdrawal.   Hypokalemia-replaced/resolved.   Mild acute hyponatremia: Stable around 130.   Fatty liver with early liver cirrhosis/chronic diastolic CHF-was managed with IV diuretics and eventually transition to oral Lasix  and added Aldactone  as well as Coreg .   RA-does not appear to be on any medications at home   Anemia of critical illness-hemoglobin fairly stable.  Transfuse if less than 7.   Thrombocytopenia-in the setting of EtOH use however this has resolved.   DM2, with hyperglycemia-currently on 50 units of Lantus  and SSI, blood sugar better controlled now.  However now that tube feeds are being discontinued, I am unsure how much she is going to have p.o. oral intake so I am discharging him on 35 units of Lantus  to avoid any hypoglycemia.  Noncompliance: It appears that patient was noncompliant with with medical care, medications and continued to drink.  He was not taking any of his antihypertensives, cardiac or antilipid medications.  He appears to be very weak.  His recent lipid profile done in December 2024 shows LDL of only 49 and based on that, I have discontinued both antilipid medications.  We resumed his  Coreg  but at lower dose during this hospitalization.  For some reason, he was also supposed to be on Plavix  which he was not taking which was never resumed here and now he is on anticoagulation.  He was also supposed to be taking Farxiga  which he was not taking, I recommend follow-up with cardiology to reestablish care and resume necessary medications.  Also, he was only on metformin  and not taking any insulin .  I have discontinued metformin  and he will be discharged on insulin .  Discontinuing metoprolol  as he is already having C. difficile.  Discharge plan was discussed with patient and/or family member and they verbalized understanding and agreed with it.  Discharge Diagnoses:  Principal Problem:   Pancreatitis, alcoholic, acute Active Problems:   Malnutrition of moderate degree   Hemoptysis   Acute hypoxemic respiratory failure (HCC)    Discharge Instructions   Allergies as of 05/05/2024       Reactions   Lisinopril  Swelling, Other (See Comments)   angioedema   Citalopram Nausea Only   Morphine  Nausea And Vomiting   Nsaids Other (See Comments)   Stomach Irritability    Paroxetine Nausea And Vomiting     Med Rec must be completed prior to using this SMARTLINK***       Contact information for after-discharge care  Destination     [facility] .   Service: Skilled Nursing Contact information: [address] [place]  [address] [phone]                    Allergies  Allergen Reactions   Lisinopril  Swelling and Other (See Comments)    angioedema   Citalopram Nausea Only   Morphine  Nausea And Vomiting   Nsaids Other (See Comments)    Stomach Irritability    Paroxetine Nausea And Vomiting    Consultations: Critical care, general surgery and GI   Procedures/Studies: DG CHEST PORT 1 VIEW Result Date: 05/01/2024 CLINICAL DATA:  Recent admission for pancreatitis. EXAM: PORTABLE CHEST 1 VIEW COMPARISON:  04/25/2024 FINDINGS:  Feeding tube is identified with tip in the left upper quadrant of the abdomen in the expected location of the duodenal jejunal junction. Normal heart size. Unchanged left pleural effusion with overlying atelectasis versus consolidation. Persistent right middle lobe atelectasis with improved aeration to the right lower lobe. Visualized osseous structures are unremarkable. IMPRESSION: 1. Unchanged left pleural effusion with overlying atelectasis versus consolidation. 2. Persistent right middle lobe atelectasis with improved aeration to the right lower lobe. Electronically Signed   By: Waddell Calk M.D.   On: 05/01/2024 12:41   CT CHEST WO CONTRAST Result Date: 04/25/2024 CLINICAL DATA:  Hemoptysis, abdominal pain, weakness EXAM: CT CHEST WITHOUT CONTRAST TECHNIQUE: Multidetector CT imaging of the chest was performed following the standard protocol without IV contrast. RADIATION DOSE REDUCTION: This exam was performed according to the departmental dose-optimization program which includes automated exposure control, adjustment of the mA and/or kV according to patient size and/or use of iterative reconstruction technique. COMPARISON:  CT abdomen 04/19/2024 FINDINGS: Cardiovascular: Mild 4-chamber cardiac enlargement. Trace pericardial fluid. LAD coronary calcifications. Central great vessels unremarkable. Mediastinum/Nodes: No mediastinal mass, hematoma, or adenopathy. Feeding tube extends at least as far as the stomach, tip not seen. Lungs/Pleura: Small pleural effusions left greater than right. No pneumothorax. Emphysema with subpleural blebs most numerous in the apices. Dependent consolidation/atelectasis in the lung bases with air bronchograms. Upper Abdomen: Extensive inflammatory/edematous changes of around the pancreatic tail and body, as before, incompletely visualized. No acute findings. Musculoskeletal: Healed sternal fracture deformity. Spondylitic changes in the visualized lower cervical spine. Bilateral  shoulder DJD. IMPRESSION: 1. Small pleural effusions with extensive consolidation/atelectasis in the lung bases. 2. Coronary artery disease. 3. Acute pancreatitis, incompletely visualized. 4.  Emphysema (ICD10-J43.9). Electronically Signed   By: JONETTA Faes M.D.   On: 04/25/2024 16:29   DG Shoulder Right Port Result Date: 04/25/2024 CLINICAL DATA:  Pain after fall. EXAM: RIGHT SHOULDER - 1 VIEW COMPARISON:  05/14/2021. FINDINGS: No acute fracture or dislocation. Redemonstrated high-riding humeral head, likely secondary to chronic rotator cuff pathology/tear. Chronic remodeling of the undersurface of the acromion. Advanced glenohumeral and acromioclavicular osteoarthritis with joint space narrowing and osteophytosis. These findings have progressed since the prior exam. IMPRESSION: 1. No acute osseous abnormality. 2. Advanced glenohumeral and acromioclavicular osteoarthritis, progressed since the prior exam. 3. Redemonstrated high-riding humeral head, likely secondary to chronic rotator cuff pathology/tear. Electronically Signed   By: Harrietta Sherry M.D.   On: 04/25/2024 13:31   DG CHEST PORT 1 VIEW Result Date: 04/25/2024 CLINICAL DATA:  36304 Hypoxemia 36304 EXAM: PORTABLE CHEST - 1 VIEW COMPARISON:  04/22/2024 FINDINGS: Feeding tube extends at least as far stomach, tip not seen. Small bilateral effusions with patchy atelectasis in the lung bases, improved on the right since previous. Heart size and mediastinal  contours are within normal limits. No pneumothorax. Right shoulder DJD. IMPRESSION: Small bilateral effusions with bibasilar atelectasis, improved on the right. Electronically Signed   By: JONETTA Faes M.D.   On: 04/25/2024 09:40   DG Chest Port 1 View Result Date: 04/23/2024 CLINICAL DATA:  Respiratory distress EXAM: PORTABLE CHEST 1 VIEW COMPARISON:  10:36 a.m. FINDINGS: Lung volumes are small, but are stable since prior examination. Small bilateral pleural effusions, right greater than left, are  again seen with associated bibasilar compressive atelectasis. Superimposed perihilar interstitial pulmonary infiltrate is present most in keeping with mild interstitial pulmonary edema. No pneumothorax. Cardiac size within normal limits. Nasoenteric feeding tube extends into the upper abdomen beyond the margin of the examination. IMPRESSION: 1. Stable pulmonary hypoinflation. 2. Stable small bilateral pleural effusions, right greater than left. 3. Stable mild interstitial pulmonary edema. Electronically Signed   By: Dorethia Molt M.D.   On: 04/23/2024 00:06   DG CHEST PORT 1 VIEW Result Date: 04/22/2024 CLINICAL DATA:  Hypoxia EXAM: PORTABLE CHEST 1 VIEW COMPARISON:  04/19/2024 FINDINGS: Enteric tube tip incompletely visualized but seen to the level of proximal duodenum. Hypoventilatory changes. Cardiomegaly with interval vascular congestion and probable pleural effusions. Bibasilar airspace disease. IMPRESSION: Cardiomegaly with interval vascular congestion and probable pleural effusions. Bibasilar airspace disease may be due to atelectasis or pneumonia. Electronically Signed   By: Luke Bun M.D.   On: 04/22/2024 14:28   DG Abd 1 View Result Date: 04/21/2024 CLINICAL DATA:  Feeding tube placement. EXAM: ABDOMEN - 1 VIEW COMPARISON:  Same day. FINDINGS: Distal tip of feeding tube is seen in expected position of fourth portion of duodenum. No abnormal bowel dilatation is noted. IMPRESSION: Distal tip of feeding tube seen in expected position of fourth portion of duodenum. Electronically Signed   By: Lynwood Landy Raddle M.D.   On: 04/21/2024 15:26   DG Abd 1 View Result Date: 04/21/2024 CLINICAL DATA:  Nasogastric tube placement EXAM: ABDOMEN - 1 VIEW COMPARISON:  CT 04/19/2024 FINDINGS: Weighted tip feeding tube has been advanced into the second portion of the duodenum. Stomach is partially distended. A few gas distended mid abdominal small bowel loops. Colon decompressed. Mild lumbar levoscoliosis with  multilevel spondylitic change. IMPRESSION: Feeding tube tip in second portion of duodenum. Electronically Signed   By: JONETTA Faes M.D.   On: 04/21/2024 11:34   CT ABDOMEN PELVIS W CONTRAST Result Date: 04/19/2024 CLINICAL DATA:  Abdominal pain. EXAM: CT ABDOMEN AND PELVIS WITH CONTRAST TECHNIQUE: Multidetector CT imaging of the abdomen and pelvis was performed using the standard protocol following bolus administration of intravenous contrast. RADIATION DOSE REDUCTION: This exam was performed according to the departmental dose-optimization program which includes automated exposure control, adjustment of the mA and/or kV according to patient size and/or use of iterative reconstruction technique. CONTRAST:  OMNIPAQUE  IOHEXOL  300 MG/ML  SOLN COMPARISON:  None Available. FINDINGS: Lower chest: Bibasilar subpleural atelectasis. No intra-abdominal free air.  Small ascites. Hepatobiliary: Fatty liver. There is irregularity of the liver contour suggestive of early cirrhosis. No biliary dilatation. The gallbladder is distended. No calcified gallstone. Pancreas: Severe inflammatory changes of the pancreas consistent with acute pancreatitis. Areas of non enhancement involving the uncinate process as well as body and tail of the pancreas suggest areas of necrosis. Significant amount of inflammatory fluid adjacent to the pancreas. No organized fluid collection. Spleen: Normal in size without focal abnormality. Adrenals/Urinary Tract: The adrenal glands unremarkable. Small bilateral renal cysts. There is no hydronephrosis on either side. The  visualized ureters appear unremarkable. The urinary bladder is poorly visualized due to streak artifact caused by bilateral hip arthroplasties. Stomach/Bowel: There is diverticulosis of the proximal colon. There is no bowel obstruction or active inflammation. The appendix is normal. Vascular/Lymphatic: Mild aortoiliac atherosclerotic disease. The IVC is unremarkable. There is thrombus  extending from the SMV into the porta splenic confluence and proximal main portal vein. There is high-grade narrowing of the SMV close to the porta splenic confluence. The main portal vein remains patent. No portal venous gas. No adenopathy. Reproductive: The prostate gland is not visualized due to streak artifact. Other: None Musculoskeletal: Degenerative changes of the spine. Bilateral total hip arthroplasties. No acute osseous pathology. IMPRESSION: 1. 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 No abscess or pseudocyst. 2. 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 3. Fatty liver with early cirrhosis. 4. Colonic diverticulosis. No bowel obstruction. Normal appendix. 5.  Aortic Atherosclerosis (ICD10-I70.0). These results were called by telephone at the time of interpretation on 04/19/2024 at 3:36 pm to Joen Paris, PA, who verbally acknowledged these results. Electronically Signed   By: Vanetta Chou M.D.   On: 04/19/2024 15:48   DG Chest Port 1 View Result Date: 04/19/2024 CLINICAL DATA:  Questionable sepsis - evaluate for abnormality. Nausea, vomiting, weakness EXAM: PORTABLE CHEST 1 VIEW COMPARISON:  05/14/2021 FINDINGS: The heart size and mediastinal contours are within normal limits. Both lungs are clear. The visualized skeletal structures are unremarkable. IMPRESSION: No active disease. Electronically Signed   By: Franky Crease M.D.   On: 04/19/2024 14:58     Discharge Exam: Vitals:   05/05/24 0344 05/05/24 0845  BP: (!) 149/65   Pulse: 79   Resp:    Temp: 97.8 F (36.6 C) 97.7 F (36.5 C)  SpO2: 98%    Vitals:   05/04/24 1038 05/04/24 2027 05/05/24 0344 05/05/24 0845  BP: 132/68 134/71 (!) 149/65   Pulse: 88 80 79   Resp:      Temp: 98.4 F (36.9 C) 98.2 F (36.8 C) 97.8 F (36.6 C) 97.7 F (36.5 C)  TempSrc: Oral Oral Oral Oral  SpO2:  98% 98%   Weight:   66.4 kg   Height:         General: Pt is alert, awake, not in acute distress Cardiovascular: RRR, S1/S2 +, no rubs, no gallops Respiratory: CTA bilaterally, no wheezing, no rhonchi Abdominal: Soft, NT, ND, bowel sounds + Extremities: no edema, no cyanosis    The results of significant diagnostics from this hospitalization (including imaging, microbiology, ancillary and laboratory) are listed below for reference.     Microbiology: Recent Results (from the past 240 hours)  Culture, blood (Routine X 2) w Reflex to ID Panel     Status: None   Collection Time: 04/26/24  5:41 AM   Specimen: BLOOD  Result Value Ref Range Status   Specimen Description   Final    BLOOD BLOOD RIGHT ARM Performed at [facility], [address] [place], [place] [address]    Special Requests   Final    BOTTLES DRAWN AEROBIC ONLY Blood Culture adequate volume Performed at [facility], [address] [place], [place] [address]    Culture   Final    NO GROWTH 5 DAYS Performed at [facility], [address], [place], [place] [address]    Report Status 05/01/2024 FINAL  Final  Culture, blood (Routine X 2)  w Reflex to ID Panel     Status: None   Collection Time: 04/26/24  5:41 AM   Specimen: BLOOD  Result Value Ref Range Status   Specimen Description   Final    BLOOD BLOOD RIGHT ARM Performed at [facility], [address] [place], [place] [address]    Special Requests   Final    BOTTLES DRAWN AEROBIC ONLY Blood Culture adequate volume Performed at [facility], [address], [place], [place] [address]    Culture   Final    NO GROWTH 5 DAYS Performed at [facility], [address] [place], [place] [address]    Report Status 05/01/2024 FINAL  Final  Culture, blood (Routine X 2) w Reflex to ID Panel     Status: None (Preliminary result)   Collection Time: 05/01/24  9:01 AM   Specimen: BLOOD  Result Value Ref Range Status    Specimen Description   Final    BLOOD BLOOD RIGHT ARM Performed at [facility], [address] [place], [place] [address]    Special Requests   Final    BOTTLES DRAWN AEROBIC AND ANAEROBIC Blood Culture results may not be optimal due to an inadequate volume of blood received in culture bottles Performed at [facility], [address], [place], [place] [address]    Culture   Final    NO GROWTH 4 DAYS Performed at [facility], [address], [place], [place] [address]    Report Status PENDING  Incomplete  Culture, blood (Routine X 2) w Reflex to ID Panel     Status: None (Preliminary result)   Collection Time: 05/01/24  9:01 AM   Specimen: BLOOD  Result Value Ref Range Status   Specimen Description   Final    BLOOD BLOOD RIGHT HAND Performed at [facility], [address] [place], [place] [address]    Special Requests   Final    BOTTLES DRAWN AEROBIC AND ANAEROBIC Blood Culture results may not be optimal due to an inadequate volume of blood received in culture bottles Performed at [facility], [address], [place], [place] [address]    Culture   Final    NO GROWTH 4 DAYS Performed at [facility], [address] [place], [place] [address]    Report Status PENDING  Incomplete  C Difficile Quick Screen (NO PCR Reflex)     Status: Abnormal   Collection Time: 05/01/24  3:04 PM   Specimen: STOOL  Result Value Ref Range Status   C Diff antigen POSITIVE (A) NEGATIVE Final   C Diff toxin POSITIVE (A) NEGATIVE Final   C Diff interpretation Toxin producing C. difficile detected.  Final    Comment: CRITICAL RESULT CALLED TO, READ BACK BY AND VERIFIED WITH: JOESEPH LABOR RN AT 1543 ON 05/01/2024 BY PRUDY POUR Performed at [facility], [address], [place], [place] [address]      Labs: BNP (last 3 results) Recent Labs    05/01/24 0901  BNP 175.0*   Basic Metabolic  Panel: Recent Labs  Lab 04/30/24 0623 05/01/24 0218 05/02/24 0857 05/04/24 1436 05/05/24 0359  NA 130* 131* 131* 131* 133*  K 3.6 3.3* 4.9 4.2 4.0  CL 89* 93* 92* 96* 98  CO2 27 25 23 23 22   GLUCOSE 168* 227* 213* 172* 135*  BUN 16 16 13 11 14   CREATININE  0.85 0.79 0.72 0.58* 0.64  CALCIUM  9.0 9.1 9.0 9.1 9.5   Liver Function Tests: Recent Labs  Lab 04/30/24 0623 05/01/24 0218  AST 46* 29  ALT 43 32  ALKPHOS 109 99  BILITOT 0.7 0.6  PROT 7.1 6.7  ALBUMIN  2.3* 2.2*   No results for input(s): LIPASE, AMYLASE in the last 168 hours. No results for input(s): AMMONIA in the last 168 hours. CBC: Recent Labs  Lab 04/30/24 0623 05/01/24 0218 05/02/24 0241 05/03/24 0345 05/05/24 0359  WBC 24.5* 21.1* 19.9* 17.8* 14.0*  NEUTROABS  --   --   --   --  8.3*  HGB 9.0* 8.3* 8.3* 8.1* 9.1*  HCT 26.4* 24.9* 25.1* 24.0* 28.3*  MCV 98.5 97.3 97.3 98.0 101.8*  PLT 446* 442* 508* 560* 688*   Cardiac Enzymes: No results for input(s): CKTOTAL, CKMB, CKMBINDEX, TROPONINI in the last 168 hours. BNP: Invalid input(s): POCBNP CBG: Recent Labs  Lab 05/04/24 1641 05/04/24 2027 05/04/24 2353 05/05/24 0351 05/05/24 0742  GLUCAP 109* 157* 169* 138* 124*   D-Dimer No results for input(s): DDIMER in the last 72 hours. Hgb A1c No results for input(s): HGBA1C in the last 72 hours. Lipid Profile No results for input(s): CHOL, HDL, LDLCALC, TRIG, CHOLHDL, LDLDIRECT in the last 72 hours. Thyroid function studies No results for input(s): TSH, T4TOTAL, T3FREE, THYROIDAB in the last 72 hours.  Invalid input(s): FREET3 Anemia work up No results for input(s): VITAMINB12, FOLATE, FERRITIN, TIBC, IRON, RETICCTPCT in the last 72 hours. Urinalysis    Component Value Date/Time   COLORURINE YELLOW 04/19/2024 1555   APPEARANCEUR CLEAR 04/19/2024 1555   LABSPEC 1.024 04/19/2024 1555   PHURINE 6.0 04/19/2024 1555   GLUCOSEU >=500 (A)  04/19/2024 1555   GLUCOSEU NEG mg/dL 96/82/7989 7968   HGBUR SMALL (A) 04/19/2024 1555   HGBUR negative 12/01/2007 1453   BILIRUBINUR NEGATIVE 04/19/2024 1555   BILIRUBINUR negative 07/05/2021 1141   BILIRUBINUR neg 09/28/2020 1452   KETONESUR NEGATIVE 04/19/2024 1555   PROTEINUR 100 (A) 04/19/2024 1555   UROBILINOGEN 0.2 07/05/2021 1141   UROBILINOGEN 0.2 02/12/2017 1010   NITRITE NEGATIVE 04/19/2024 1555   LEUKOCYTESUR SMALL (A) 04/19/2024 1555   Sepsis Labs Recent Labs  Lab 05/01/24 0218 05/02/24 0241 05/03/24 0345 05/05/24 0359  WBC 21.1* 19.9* 17.8* 14.0*   Microbiology Recent Results (from the past 240 hours)  Culture, blood (Routine X 2) w Reflex to ID Panel     Status: None   Collection Time: 04/26/24  5:41 AM   Specimen: BLOOD  Result Value Ref Range Status   Specimen Description   Final    BLOOD BLOOD RIGHT ARM Performed at [facility], [address] [place], [place] [address]    Special Requests   Final    BOTTLES DRAWN AEROBIC ONLY Blood Culture adequate volume Performed at [facility], [address], [place], [place] [address]    Culture   Final    NO GROWTH 5 DAYS Performed at [facility], [address], [place], [place] [address]    Report Status 05/01/2024 FINAL  Final  Culture, blood (Routine X 2) w Reflex to ID Panel     Status: None   Collection Time: 04/26/24  5:41 AM   Specimen: BLOOD  Result Value Ref Range Status   Specimen Description   Final    BLOOD BLOOD RIGHT ARM Performed at [facility], [address], [place], [place] [address]  Special Requests   Final    BOTTLES DRAWN AEROBIC ONLY Blood Culture adequate volume Performed at [facility], [address], [place], [place] [address]    Culture   Final    NO GROWTH 5 DAYS Performed at [facility], [address], [place], [place] [address]    Report Status 05/01/2024 FINAL  Final   Culture, blood (Routine X 2) w Reflex to ID Panel     Status: None (Preliminary result)   Collection Time: 05/01/24  9:01 AM   Specimen: BLOOD  Result Value Ref Range Status   Specimen Description   Final    BLOOD BLOOD RIGHT ARM Performed at [facility], [address], [place], [place] [address]    Special Requests   Final    BOTTLES DRAWN AEROBIC AND ANAEROBIC Blood Culture results may not be optimal due to an inadequate volume of blood received in culture bottles Performed at [facility], [address], [place], [place] [address]    Culture   Final    NO GROWTH 4 DAYS Performed at [facility], [address] [place], [place] [address]    Report Status PENDING  Incomplete  Culture, blood (Routine X 2) w Reflex to ID Panel     Status: None (Preliminary result)   Collection Time: 05/01/24  9:01 AM   Specimen: BLOOD  Result Value Ref Range Status   Specimen Description   Final    BLOOD BLOOD RIGHT HAND Performed at [facility], [address], [place], [place] [address]    Special Requests   Final    BOTTLES DRAWN AEROBIC AND ANAEROBIC Blood Culture results may not be optimal due to an inadequate volume of blood received in culture bottles Performed at [facility], [address], [place], [place] [address]    Culture   Final    NO GROWTH 4 DAYS Performed at [facility], [address] [place], [place] [address]    Report Status PENDING  Incomplete  C Difficile Quick Screen (NO PCR Reflex)     Status: Abnormal   Collection Time: 05/01/24  3:04 PM   Specimen: STOOL  Result Value Ref Range Status   C Diff antigen POSITIVE (A) NEGATIVE Final   C Diff toxin POSITIVE (A) NEGATIVE Final   C Diff interpretation Toxin producing C. difficile detected.  Final    Comment: CRITICAL RESULT CALLED TO, READ BACK BY AND VERIFIED WITH: JOESEPH LABOR RN AT 1543 ON 05/01/2024 BY PRUDY POUR Performed  at [facility] [address], [place], [place] [address]     FURTHER DISCHARGE INSTRUCTIONS:   Get Medicines reviewed and adjusted: Please take all your medications with you for your next visit with your Primary MD   Laboratory/radiological data: Please request your Primary MD to go over all hospital tests and procedure/radiological results at the follow up, please ask your Primary MD to get all Hospital records sent to his/her office.   In some cases, they will be blood work, cultures and biopsy results pending at the time of your discharge. Please request that your primary care M.D. goes through all the records of your hospital data and follows up on these results.   Also Note the following: If you experience worsening of your admission symptoms, develop shortness of breath, life threatening emergency, suicidal or homicidal thoughts you must seek medical attention immediately by calling 911  or calling your MD immediately  if symptoms less severe.   You must read complete instructions/literature along with all the possible adverse reactions/side effects for all the Medicines you take and that have been prescribed to you. Take any new Medicines after you have completely understood and accpet all the possible adverse reactions/side effects.    patient was instructed, not to drive, operate heavy machinery, perform activities at heights, swimming or participation in water activities or provide baby-sitting services while on Pain, Sleep and Anxiety Medications; until their outpatient Physician has advised to do so again. Also recommended to not to take more than prescribed Pain, Sleep and Anxiety Medications.  It is not advisable to combine anxiety, sleep and pain medications without talking with your primary care provider.     Wear Seat belts while driving.   Please note: You were cared for by a hospitalist during your hospital stay. Once you are discharged, your primary  care physician will handle any further medical issues. Please note that NO REFILLS for any discharge medications will be authorized once you are discharged, as it is imperative that you return to your primary care physician (or establish a relationship with a primary care physician if you do not have one) for your post hospital discharge needs so that they can reassess your need for medications and monitor your lab values  Time coordinating discharge: Over 30 minutes  SIGNED:   Fredia Skeeter, MD  [facility] 05/05/2024, 11:33 AM *Please note that this is a verbal dictation therefore any spelling or grammatical errors are due to the Dragon Medical One system interpretation. If 7PM-7AM, please contact night-coverage [email or web address]

## 2024-05-06 ENCOUNTER — Other Ambulatory Visit (HOSPITAL_COMMUNITY): Payer: Self-pay

## 2024-05-06 DIAGNOSIS — K8521 Alcohol induced acute pancreatitis with uninfected necrosis: Secondary | ICD-10-CM | POA: Diagnosis not present

## 2024-05-06 LAB — CULTURE, BLOOD (ROUTINE X 2)
Culture: NO GROWTH
Culture: NO GROWTH

## 2024-05-06 LAB — GLUCOSE, CAPILLARY
Glucose-Capillary: 113 mg/dL — ABNORMAL HIGH (ref 70–99)
Glucose-Capillary: 144 mg/dL — ABNORMAL HIGH (ref 70–99)

## 2024-05-06 MED ORDER — VANCOMYCIN HCL 125 MG PO CAPS
125.0000 mg | ORAL_CAPSULE | Freq: Four times a day (QID) | ORAL | 0 refills | Status: AC
  Filled 2024-05-06: qty 24, 6d supply, fill #0

## 2024-05-06 MED ORDER — FUROSEMIDE 40 MG PO TABS
40.0000 mg | ORAL_TABLET | Freq: Every day | ORAL | 0 refills | Status: DC
  Filled 2024-05-06: qty 30, 30d supply, fill #0

## 2024-05-06 MED ORDER — OXYCODONE HCL 10 MG PO TABS
10.0000 mg | ORAL_TABLET | Freq: Four times a day (QID) | ORAL | 0 refills | Status: DC | PRN
Start: 2024-05-06 — End: 2024-06-29

## 2024-05-06 MED ORDER — SPIRONOLACTONE 25 MG PO TABS
25.0000 mg | ORAL_TABLET | Freq: Every day | ORAL | 0 refills | Status: DC
  Filled 2024-05-06: qty 30, 30d supply, fill #0

## 2024-05-06 MED ORDER — CARVEDILOL 12.5 MG PO TABS
12.5000 mg | ORAL_TABLET | Freq: Two times a day (BID) | ORAL | 9 refills | Status: DC
  Filled 2024-05-06: qty 60, 30d supply, fill #0

## 2024-05-06 MED ORDER — INSULIN GLARGINE-YFGN 100 UNIT/ML ~~LOC~~ SOLN
35.0000 [IU] | Freq: Every day | SUBCUTANEOUS | 0 refills | Status: DC
Start: 2024-05-06 — End: 2024-05-17
  Filled 2024-05-06: qty 10, 28d supply, fill #0

## 2024-05-06 MED ORDER — APIXABAN 5 MG PO TABS
5.0000 mg | ORAL_TABLET | Freq: Two times a day (BID) | ORAL | 0 refills | Status: DC
Start: 2024-05-06 — End: 2024-05-17
  Filled 2024-05-06: qty 60, 30d supply, fill #0

## 2024-05-06 MED ORDER — VITAMIN B-1 100 MG PO TABS
100.0000 mg | ORAL_TABLET | Freq: Every day | ORAL | 0 refills | Status: DC
  Filled 2024-05-06: qty 30, 30d supply, fill #0

## 2024-05-06 MED ORDER — FOLIC ACID 1 MG PO TABS
1.0000 mg | ORAL_TABLET | Freq: Every day | ORAL | 0 refills | Status: DC
  Filled 2024-05-06: qty 30, 30d supply, fill #0

## 2024-05-06 NOTE — Plan of Care (Signed)
 Pt able to tolerate oral consumption  Report called to facility PRN pain med given Pt left with ptar   Problem: Clinical Measurements: Goal: Ability to maintain clinical measurements within normal limits will improve Outcome: Adequate for Discharge   Problem: Clinical Measurements: Goal: Respiratory complications will improve Outcome: Adequate for Discharge

## 2024-05-06 NOTE — TOC Transition Note (Signed)
 Transition of Care [facility]) - Discharge Note  Patient Details  Name: [name] MRN: [id number] Date of Birth: 25-Jul-1956  Transition of Care [facility] CM/SW Contact:  Duwaine GORMAN Aran, LCSW Phone Number: 05/06/2024, 11:01 AM  Clinical Narrative: Patient is medically ready to discharge to [facility]. Patient will go to room 115 and the number for report is [phone]. Discharge summary, discharge orders, and SNF transfer report faxed to facility in hub. Medical necessity form done; PTAR scheduled. Discharge packet completed. Patient notified of transportation being set up. RN updated. TOC signing off.  Final next level of care: Skilled Nursing Facility Barriers to Discharge: Barriers Resolved  Patient Goals and CMS Choice Patient states their goals for this hospitalization and ongoing recovery are:: Go to rehab CMS Medicare.gov Compare Post Acute Care list provided to:: Patient Choice offered to / list presented to : Patient  Discharge Placement PASRR number recieved: 05/03/24 Patient chooses bed at: [facility] Patient to be transferred to facility by: PTAR Patient and family notified of of transfer: 05/06/24  Discharge Plan and Services Additional resources added to the After Visit Summary for   In-house Referral: Clinical Social Work Discharge Planning Services: CM Consult Post Acute Care Choice: Skilled Nursing Facility          DME Arranged: N/A DME Agency: NA HH Arranged: NA HH Agency: NA  Social Drivers of Health (SDOH) Interventions SDOH Screenings   Food Insecurity: Patient Unable To Answer (04/20/2024)  Housing: Patient Unable To Answer (04/20/2024)  Transportation Needs: Patient Unable To Answer (04/20/2024)  Utilities: Patient Unable To Answer (04/20/2024)  Alcohol Screen: Low Risk  (07/10/2023)  Depression (PHQ2-9): High Risk (04/02/2024)  Financial Resource Strain: Low Risk  (07/10/2023)  Physical Activity: Sufficiently Active (07/10/2023)  Social  Connections: Patient Unable To Answer (04/20/2024)  Stress: No Stress Concern Present (07/10/2023)  Tobacco Use: High Risk (04/19/2024)  Health Literacy: Adequate Health Literacy (07/10/2023)   Readmission Risk Interventions    04/21/2024   11:05 AM  Readmission Risk Prevention Plan  Transportation Screening Complete  PCP or Specialist Appt within 3-5 Days Complete  HRI or Home Care Consult Complete  Social Work Consult for Recovery Care Planning/Counseling Complete  Palliative Care Screening Not Applicable  Medication Review [employer]) Complete

## 2024-05-06 NOTE — Care Management Important Message (Signed)
 Important Message  Patient Details IM Letter given. Name: [name] MRN: [id number] Date of Birth: 02-Aug-1956   Important Message Given:  Yes - Medicare IM     Melba Ates 05/06/2024, 9:31 AM

## 2024-05-17 ENCOUNTER — Encounter: Payer: Self-pay | Admitting: Nurse Practitioner

## 2024-05-17 ENCOUNTER — Telehealth: Payer: Self-pay | Admitting: Internal Medicine

## 2024-05-17 ENCOUNTER — Telehealth: Payer: Self-pay | Admitting: Nurse Practitioner

## 2024-05-17 ENCOUNTER — Telehealth: Payer: Self-pay

## 2024-05-17 ENCOUNTER — Ambulatory Visit (INDEPENDENT_AMBULATORY_CARE_PROVIDER_SITE_OTHER): Admitting: Nurse Practitioner

## 2024-05-17 VITALS — BP 124/60 | HR 50 | Temp 98.6°F | Wt 146.0 lb

## 2024-05-17 DIAGNOSIS — K8521 Alcohol induced acute pancreatitis with uninfected necrosis: Secondary | ICD-10-CM | POA: Diagnosis not present

## 2024-05-17 DIAGNOSIS — K7460 Unspecified cirrhosis of liver: Secondary | ICD-10-CM

## 2024-05-17 DIAGNOSIS — I509 Heart failure, unspecified: Secondary | ICD-10-CM | POA: Diagnosis not present

## 2024-05-17 DIAGNOSIS — E118 Type 2 diabetes mellitus with unspecified complications: Secondary | ICD-10-CM | POA: Diagnosis not present

## 2024-05-17 DIAGNOSIS — A0472 Enterocolitis due to Clostridium difficile, not specified as recurrent: Secondary | ICD-10-CM

## 2024-05-17 DIAGNOSIS — K760 Fatty (change of) liver, not elsewhere classified: Secondary | ICD-10-CM

## 2024-05-17 MED ORDER — APIXABAN 5 MG PO TABS: 5.0000 mg | ORAL_TABLET | Freq: Two times a day (BID) | ORAL | 0 refills | Status: DC

## 2024-05-17 MED ORDER — SPIRONOLACTONE 25 MG PO TABS: 25.0000 mg | ORAL_TABLET | Freq: Every day | ORAL | 0 refills | Status: DC

## 2024-05-17 MED ORDER — FOLIC ACID 1 MG PO TABS: 1.0000 mg | ORAL_TABLET | Freq: Every day | ORAL | 0 refills | Status: DC

## 2024-05-17 MED ORDER — VITAMIN B-1 100 MG PO TABS: 100.0000 mg | ORAL_TABLET | Freq: Every day | ORAL | 0 refills | Status: AC

## 2024-05-17 MED ORDER — INSULIN GLARGINE-YFGN 100 UNIT/ML ~~LOC~~ SOLN: 35.0000 [IU] | Freq: Every day | SUBCUTANEOUS | 0 refills | Status: DC

## 2024-05-17 MED ORDER — VANCOMYCIN HCL 125 MG PO CAPS: 125.0000 mg | ORAL_CAPSULE | Freq: Four times a day (QID) | ORAL | 0 refills | Status: AC

## 2024-05-17 MED ORDER — FUROSEMIDE 40 MG PO TABS: 40.0000 mg | ORAL_TABLET | Freq: Every day | ORAL | 0 refills | Status: DC

## 2024-05-17 NOTE — Telephone Encounter (Signed)
 Copied from CRM [id number]. Topic: Clinical - Home Health Verbal Orders >> May 17, 2024  1:06 PM Tiffini S wrote: Caller/Agency: Vernell with [facility] Callback Number: [phone] Service Requested: Physical Therapy Frequency: once a week for 4 weeks Any new concerns about the patient? Yes, recommends a nursing evaluation for medication management- need a referral/ written order Fax [phone]  Please advise [facility]

## 2024-05-17 NOTE — Addendum Note (Signed)
 Addended by: [name] on: 05/17/2024 03:39 PM   Modules accepted: Orders

## 2024-05-17 NOTE — Telephone Encounter (Signed)
 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

## 2024-05-17 NOTE — Telephone Encounter (Signed)
 Dr. Albertus,  Urgent referral received for pancreatitis.  Patient recently saw Dr. Dianna in hospital. PCP is requesting for patient to be seen by [facility].  Please advise scheduling and urgency.  Thanks [employer]  DOD 7/21/25PM

## 2024-05-17 NOTE — Progress Notes (Signed)
 Subjective   Patient ID: [name], male    DOB: Jan 02, 1956, 67 y.o.   MRN: [id number]  Chief Complaint  Patient presents with   Hospitalization Follow-up    Referring provider: [name], NP  [name] is a 68 y.o. male with Past Medical History: No date: Alcohol abuse No date: Allergic rhinitis No date: CHF (congestive heart failure) (HCC) No date: Diabetes mellitus without complication (HCC)     Comment:  'sometime last yr'--type 2 No date: GERD (gastroesophageal reflux disease) No date: Hepatitis     Comment:  he thinks its hep b or c No date: Hyperlipidemia No date: Hypertension No date: Osteoarthritis No date: Pancreatitis No date: Rheumatoid arthritis(714.0) No date: Stroke [facility]) No date: Tobacco user  Recent significant events:  Hospitalization: 04/19/24  Hospital Summary:  Acute necrotizing pancreatitis, EtOH induced -imaging on admission showed acute pancreatitis with findings suggestive of necrotizing pancreatitis.  With no abscess or pseudocyst were seen.  GI consulted, now signed off.  General surgery also consulted, no indication for surgery and they signed off as well.  Patient's pain is very well-controlled.  Patient was dependent on tube feeds for several days.  He has been eating very well for last few days, tube feed was stopped yesterday 05/05/2024 and NG tube was removed.  Patient encouraged to supplement with protein shakes.   Acute hypoxic respiratory failure and sepsis secondary to aspiration pneumonia, sepsis not present on admission: - Chest x-ray does not show any significant ARDS, however CT chest completed on 04/25/2024 shows extensive consolidation in the lung bases.  Patient developed fever of 102 again at 11:30 PM on 04/27/2024.  Has been restarted on Rocephin  since 04/26/2024.  He was afebrile for 3 days until late evening of 05/01/2023 when he developed fever of 101.6, This is despite of him receiving Rocephin  and leukocytosis  improving.  Blood culture repeated on 05/01/2024 and is negative.  Was diagnosed with C. difficile, details below for that.  repeat chest x-ray on 05/01/2024 shows unchanged left pleural effusion and possible consolidation which was also unchanged, completed 7 days of Rocephin  on 05/02/2024.  He has been weaned to room air since 04/30/2024.   C. difficile colitis: Started having fever on the evening of 04/30/2024 which persisted and had worsening diarrhea on 05/01/2024 raising concern for C. difficile.  Unfortunately, he was tested positive for that.  Started on vancomycin  but continued to have profuse diarrhea.  Finally looks like he has had 2-3 bowel movements in the last 24 hours.  Fortunately he is now afebrile. Last temperature spike at midnight 05/02/2024.  Has been having 2-3 bowel movements a day.  Completed few days of vancomycin , discharging on 6 more days.   SMV portal splenic thrombus/hemoptysis- This is likely due to pancreatitis.  Thrombus is nonocclusive.  Heparin  was held due to hemoptysis on early morning of 04/27/2024 but resumed later on the same day.  He was monitored closely, did not have any further hemoptysis so he was transition to DOAC on 04/30/2024.  Hemoglobin has remained stable.   Essential hypertension-pretty hypertensive on admission, has been placed on amlodipine , Coreg .  Blood pressure very well-controlled.   EtOH use-no withdrawals noted, status post Librium  taper, out of the window for withdrawal.   Hypokalemia-replaced/resolved.   Mild acute hyponatremia: Stable around 130.   Fatty liver with early liver cirrhosis/chronic diastolic CHF-was managed with IV diuretics and eventually transition to oral Lasix  and added Aldactone  as well as Coreg .  RA-does not appear to be on any medications at home   Anemia of critical illness-hemoglobin fairly stable.  Transfuse if less than 7.   Thrombocytopenia-in the setting of EtOH use however this has resolved.   DM2, with  hyperglycemia-currently on 50 units of Lantus  and SSI, blood sugar better controlled now.  However now that tube feeds are being discontinued, I am unsure how much she is going to have p.o. oral intake so I am discharging him on 35 units of Lantus  to avoid any hypoglycemia.   Noncompliance: It appears that patient was noncompliant with with medical care, medications and continued to drink.  He was not taking any of his antihypertensives, cardiac or antilipid medications.  He appears to be very weak.  His recent lipid profile done in December 2024 shows LDL of only 49 and based on that, I have discontinued both antilipid medications.  We resumed his Coreg  but at lower dose during this hospitalization.  For some reason, he was also supposed to be on Plavix  which he was not taking which was never resumed here and now he is on anticoagulation.  He was also supposed to be taking Farxiga  which he was not taking, I recommend follow-up with cardiology to reestablish care and resume necessary medications.  Also, he was only on metformin  and not taking any insulin .  I have discontinued metformin  and he will be discharged on insulin .  Discontinuing metoprolol  as he is already having C. difficile.   HPI  Patient presents today for hospital follow-up.  Overall he is doing well since hospital discharge.  Please see notes above for hospital summary.  Unfortunately patient did not get his new medications at discharge.  He only has 3 of the medications that are in his current med list.  I have sent refills for the medications that he does not have.  He did not pick up vancomycin .  I have sent in a prescription for this today.  I am consulting with our pharmacist for med reconciliation and help with compliance.  Patient does need referrals to GI and cardiology.  These referrals will be placed today.  Updated med list was sent home with patient today. Denies f/c/s, n/v/d, hemoptysis, PND, leg swelling Denies chest pain or  edema     Allergies  Allergen Reactions   Lisinopril  Swelling and Other (See Comments)    angioedema   Citalopram Nausea Only   Morphine  Nausea And Vomiting   Nsaids Other (See Comments)    Stomach Irritability    Paroxetine Nausea And Vomiting    Immunization History  Administered Date(s) Administered   Dtap, Unspecified 07/24/1967   Fluad Quad(high Dose 65+) 12/13/2021, 10/11/2022   Influenza Whole 10/17/2010   Influenza, High Dose Seasonal PF 07/23/2023   Influenza, Seasonal, Injecte, Preservative Fre 10/06/2012, 07/27/2013, 11/23/2014   Influenza,inj,Quad PF,6+ Mos 09/09/2016, 10/01/2017, 11/13/2018   Influenza-Unspecified 10/04/2004, 08/28/2010, 09/25/2011, 10/28/2012, 10/11/2022   PFIZER(Purple Top)SARS-COV-2 Vaccination 05/17/2020, 06/08/2020   PNEUMOCOCCAL CONJUGATE-20 01/13/2023   Pneumococcal Polysaccharide-23 01/24/2004, 02/12/2017   Polio, Unspecified 07/24/1967   Respiratory Syncytial Virus Vaccine,Recomb Aduvanted(Arexvy) 10/11/2022   Td 10/17/2010   Td (Adult),5 Lf Tetanus Toxid, Preservative Free 12/13/2021   Tdap 05/19/2012, 08/01/2020    Tobacco History: Social History   Tobacco Use  Smoking Status Every Day   Current packs/day: 0.40   Average packs/day: 0.4 packs/day for 25.0 years (10.0 ttl pk-yrs)   Types: Cigarettes   Passive exposure: Current  Smokeless Tobacco Never  Tobacco Comments   Trying  to quit smoking.  Currently smoking 1 -2 cigarette per day and is doing OK with decrease.   Ready to quit: Not Answered Counseling given: Yes Tobacco comments: Trying to quit smoking.  Currently smoking 1 -2 cigarette per day and is doing OK with decrease.   Outpatient Encounter Medications as of 05/17/2024  Medication Sig   amLODipine  (NORVASC ) 10 MG tablet Take 1 tablet (10 mg total) by mouth daily.   carvedilol  (COREG ) 12.5 MG tablet Take 1 tablet (12.5 mg total) by mouth 2 (two) times daily with a meal.   oxyCODONE  10 MG TABS Take 1 tablet  (10 mg total) by mouth every 6 (six) hours as needed for moderate pain (pain score 4-6) or breakthrough pain.   vancomycin  (VANCOCIN ) 125 MG capsule Take 1 capsule (125 mg total) by mouth 4 (four) times daily for 6 days.   [DISCONTINUED] apixaban  (ELIQUIS ) 5 MG TABS tablet Take 1 tablet (5 mg total) by mouth 2 (two) times daily.   [DISCONTINUED] folic acid  (FOLVITE ) 1 MG tablet Take 1 tablet (1 mg total) by mouth daily.   [DISCONTINUED] furosemide  (LASIX ) 40 MG tablet Take 1 tablet (40 mg total) by mouth daily.   [DISCONTINUED] insulin  glargine-yfgn (SEMGLEE ) 100 UNIT/ML injection Inject 0.35 mLs (35 Units total) into the skin daily.   [DISCONTINUED] spironolactone  (ALDACTONE ) 25 MG tablet Take 1 tablet (25 mg total) by mouth daily.   [DISCONTINUED] thiamine  (VITAMIN B-1) 100 MG tablet Take 1 tablet (100 mg total) by mouth daily.   apixaban  (ELIQUIS ) 5 MG TABS tablet Take 1 tablet (5 mg total) by mouth 2 (two) times daily.   folic acid  (FOLVITE ) 1 MG tablet Take 1 tablet (1 mg total) by mouth daily.   furosemide  (LASIX ) 40 MG tablet Take 1 tablet (40 mg total) by mouth daily.   insulin  glargine-yfgn (SEMGLEE ) 100 UNIT/ML injection Inject 0.35 mLs (35 Units total) into the skin daily.   spironolactone  (ALDACTONE ) 25 MG tablet Take 1 tablet (25 mg total) by mouth daily.   tamsulosin  (FLOMAX ) 0.4 MG CAPS capsule Take 2 capsules (0.8 mg total) by mouth daily. (Patient not taking: Reported on 04/20/2024)   thiamine  (VITAMIN B-1) 100 MG tablet Take 1 tablet (100 mg total) by mouth daily.   No facility-administered encounter medications on file as of 05/17/2024.    Review of Systems  Review of Systems  Constitutional: Negative.   HENT: Negative.    Cardiovascular: Negative.   Gastrointestinal: Negative.   Allergic/Immunologic: Negative.   Neurological: Negative.   Psychiatric/Behavioral: Negative.       Objective:   BP 124/60   Pulse (!) 50   Temp 98.6 F (37 C) (Oral)   Wt 146 lb (66.2  kg)   SpO2 100%   BMI 22.87 kg/m   Wt Readings from Last 5 Encounters:  05/17/24 146 lb (66.2 kg)  05/06/24 143 lb 8 oz (65.1 kg)  04/02/24 147 lb (66.7 kg)  12/02/23 156 lb (70.8 kg)  11/19/23 156 lb (70.8 kg)     Physical Exam Vitals and nursing note reviewed.  Constitutional:      General: He is not in acute distress.    Appearance: He is well-developed.  Cardiovascular:     Rate and Rhythm: Normal rate and regular rhythm.  Pulmonary:     Effort: Pulmonary effort is normal.     Breath sounds: Normal breath sounds.  Skin:    General: Skin is warm and dry.  Neurological:     Mental Status:  He is alert and oriented to person, place, and time.       Assessment & Plan:   Type 2 diabetes mellitus with complication, without long-term current use of insulin  (HCC) -     CBC -     Comprehensive metabolic panel with GFR -     AMB Referral VBCI Care Management  Alcohol-induced acute pancreatitis with uninfected necrosis -     Ambulatory referral to Gastroenterology  Chronic congestive heart failure, unspecified heart failure type (HCC) -     Ambulatory referral to Cardiology  Cirrhosis of liver without ascites, unspecified hepatic cirrhosis type (HCC) -     Ambulatory referral to Gastroenterology  Fatty liver -     Ambulatory referral to Gastroenterology  C. difficile diarrhea -     Ambulatory referral to Gastroenterology  Other orders -     Apixaban ; Take 1 tablet (5 mg total) by mouth 2 (two) times daily.  Dispense: 60 tablet; Refill: 0 -     Folic Acid ; Take 1 tablet (1 mg total) by mouth daily.  Dispense: 30 tablet; Refill: 0 -     Furosemide ; Take 1 tablet (40 mg total) by mouth daily.  Dispense: 30 tablet; Refill: 0 -     Insulin  Glargine-yfgn; Inject 0.35 mLs (35 Units total) into the skin daily.  Dispense: 10 mL; Refill: 0 -     Spironolactone ; Take 1 tablet (25 mg total) by mouth daily.  Dispense: 30 tablet; Refill: 0 -     Vitamin B-1; Take 1 tablet (100  mg total) by mouth daily.  Dispense: 30 tablet; Refill: 0 -     Vancomycin  HCl; Take 1 capsule (125 mg total) by mouth 4 (four) times daily for 6 days.  Dispense: 24 capsule; Refill: 0     Return in about 2 weeks (around 05/31/2024).   Bascom GORMAN Borer, NP 05/17/2024

## 2024-05-17 NOTE — Telephone Encounter (Unsigned)
 Copied from CRM [id number]. Topic: Clinical - Medication Refill >> May 17, 2024  5:30 PM Tiffini S wrote: Medication: Syringes  Has the patient contacted their pharmacy? Yes, patient is at the pharmacy location  (Agent: If no, request that the patient contact the pharmacy for the refill. If patient does not wish to contact the pharmacy document the reason why and proceed with request.) (Agent: If yes, when and what did the pharmacy advise?)  This is the patient's preferred pharmacy:  [facility] [phone] [address], [place] - [address] [place] [address] Phone: [phone] Fax: [phone]   Is this the correct pharmacy for this prescription? Yes If no, delete pharmacy and type the correct one.   Has the prescription been filled recently? Yes  Is the patient out of the medication? Yes, have the medication but no Syringes  Has the patient been seen for an appointment in the last year OR does the patient have an upcoming appointment? Yes  Can we respond through MyChart? No, please call patient at [phone]  Agent: Please be advised that Rx refills may take up to 3 business days. We ask that you follow-up with your pharmacy.

## 2024-05-18 ENCOUNTER — Other Ambulatory Visit: Payer: Self-pay

## 2024-05-18 ENCOUNTER — Other Ambulatory Visit: Payer: Self-pay | Admitting: Nurse Practitioner

## 2024-05-18 ENCOUNTER — Telehealth: Payer: Self-pay

## 2024-05-18 ENCOUNTER — Ambulatory Visit: Payer: Self-pay

## 2024-05-18 LAB — COMPREHENSIVE METABOLIC PANEL WITH GFR
ALT: 12 IU/L (ref 0–44)
AST: 17 IU/L (ref 0–40)
Albumin: 3.8 g/dL — ABNORMAL LOW (ref 3.9–4.9)
Alkaline Phosphatase: 92 IU/L (ref 44–121)
BUN/Creatinine Ratio: 4 — ABNORMAL LOW (ref 10–24)
BUN: 3 mg/dL — ABNORMAL LOW (ref 8–27)
Bilirubin Total: 0.6 mg/dL (ref 0.0–1.2)
CO2: 21 mmol/L (ref 20–29)
Calcium: 9.3 mg/dL (ref 8.6–10.2)
Chloride: 101 mmol/L (ref 96–106)
Creatinine, Ser: 0.71 mg/dL — ABNORMAL LOW (ref 0.76–1.27)
Globulin, Total: 3.3 g/dL (ref 1.5–4.5)
Glucose: 151 mg/dL — ABNORMAL HIGH (ref 70–99)
Potassium: 3.4 mmol/L — ABNORMAL LOW (ref 3.5–5.2)
Sodium: 137 mmol/L (ref 134–144)
Total Protein: 7.1 g/dL (ref 6.0–8.5)
eGFR: 101 mL/min/1.73 (ref >59)

## 2024-05-18 LAB — CBC
Hematocrit: 33.1 % — ABNORMAL LOW (ref 37.5–51.0)
Hemoglobin: 11.3 g/dL — ABNORMAL LOW (ref 13.0–17.7)
MCH: 33.2 pg — ABNORMAL HIGH (ref 26.6–33.0)
MCHC: 34.1 g/dL (ref 31.5–35.7)
MCV: 97 fL (ref 79–97)
Platelets: 316 x10E3/uL (ref 150–450)
RBC: 3.4 x10E6/uL — ABNORMAL LOW (ref 4.14–5.80)
RDW: 12.5 % (ref 11.6–15.4)
WBC: 7.7 x10E3/uL (ref 3.4–10.8)

## 2024-05-18 LAB — MICROALBUMIN / CREATININE URINE RATIO
Creatinine, Urine: 42.4 mg/dL
Microalb/Creat Ratio: 111 mg/g{creat} — ABNORMAL HIGH (ref 0–29)
Microalbumin, Urine: 46.9 ug/mL

## 2024-05-18 NOTE — Telephone Encounter (Signed)
 FYI Only or Action Required?: Action required by provider: needs syringes for insulin  called in asap.  Received the insulin  yesterday.  Patient was last seen in primary care on 05/17/2024 by [name], NP.  Called Nurse Triage reporting elevated bgl.  Symptoms began today.  Interventions attempted: Nothing.  Symptoms are: unchanged.  Triage Disposition: No disposition on file.  Patient/caregiver understands and will follow disposition?:        Copied from CRM [id number]. Topic: Clinical - Prescription Issue >> May 18, 2024 12:32 PM [name] wrote: Reason for CRM: patient stated his provider was suppose to send in syringes for his insulin . He does not have any at all and please call pt regarding any information. Reason for Disposition  [1] Blood glucose > 300 mg/dL (83.2 mmol/L) AND [7] two or more times in a row  Answer Assessment - Initial Assessment Questions 1. BLOOD GLUCOSE: What is your blood glucose level?      300 2. ONSET: When did you check the blood glucose?     This am and is out of syringes.  States doesn't normally check bgl 3. USUAL RANGE: What is your glucose level usually? (e.g., usual fasting morning value, usual evening value)     Does not know 4. KETONES: Do you check for ketones (urine or blood test strips)? If Yes, ask: What does the test show now?      no 5. TYPE 1 or 2:  Do you know what type of diabetes you have?  (e.g., Type 1, Type 2, Gestational; doesn't know)      Dm 2 6. INSULIN : Do you take insulin ? What type of insulin (s) do you use? What is the mode of delivery? (syringe, pen; injection or pump)?      Yes, needs syringes 7. DIABETES PILLS: Do you take any pills for your diabetes? If Yes, ask: Have you missed taking any pills recently?     yes 8. OTHER SYMPTOMS: Do you have any symptoms? (e.g., fever, frequent urination, difficulty breathing, dizziness, weakness, vomiting)     no 9. PREGNANCY: Is there any chance  you are pregnant? When was your last menstrual period?     na  Protocols used: Diabetes - High Blood Sugar-A-AH

## 2024-05-18 NOTE — Telephone Encounter (Signed)
 Sent to [facility]

## 2024-05-18 NOTE — Telephone Encounter (Unsigned)
 Copied from CRM [id number]. Topic: Clinical - Medication Refill >> May 18, 2024  9:45 AM Tiffany S wrote: Medication: insulin  glargine-yfgn (SEMGLEE ) 100 UNIT/ML injection [[id number]]  Has the patient contacted their pharmacy? Yes (Agent: If no, request that the patient contact the pharmacy for the refill. If patient does not wish to contact the pharmacy document the reason why and proceed with request.) (Agent: If yes, when and what did the pharmacy advise?)  This is the patient's preferred pharmacy:  [id number] [address], [place] - [address] [place] [address] Phone: [phone] Fax: [phone]   Is this the correct pharmacy for this prescription? Yes If no, delete pharmacy and type the correct one.   Has the prescription been filled recently? Yes  Is the patient out of the medication? Yes  Has the patient been seen for an appointment in the last year OR does the patient have an upcoming appointment? Yes  Can we respond through MyChart? Yes  Agent: Please be advised that Rx refills may take up to 3 business days. We ask that you follow-up with your pharmacy.

## 2024-05-18 NOTE — Telephone Encounter (Signed)
 Copied from CRM [id number]. Topic: Clinical - Medication Refill >> May 18, 2024  9:45 AM Tiffany S wrote: Medication: insulin  glargine-yfgn (SEMGLEE ) 100 UNIT/ML injection [[id number]]  Has the patient contacted their pharmacy? Yes (Agent: If no, request that the patient contact the pharmacy for the refill. If patient does not wish to contact the pharmacy document the reason why and proceed with request.) (Agent: If yes, when and what did the pharmacy advise?)  This is the patient's preferred pharmacy:  [id number] [address], [place] - [address] [place] [address] Phone: [phone] Fax: [phone]   Is this the correct pharmacy for this prescription? Yes If no, delete pharmacy and type the correct one.   Has the prescription been filled recently? Yes  Is the patient out of the medication? Yes  Has the patient been seen for an appointment in the last year OR does the patient have an upcoming appointment? Yes  Can we respond through MyChart? Yes  Agent: Please be advised that Rx refills may take up to 3 business days. We ask that you follow-up with your pharmacy.

## 2024-05-19 ENCOUNTER — Other Ambulatory Visit: Payer: Self-pay

## 2024-05-19 ENCOUNTER — Telehealth: Payer: Self-pay

## 2024-05-19 ENCOUNTER — Other Ambulatory Visit: Payer: Self-pay | Admitting: Nurse Practitioner

## 2024-05-19 ENCOUNTER — Ambulatory Visit: Payer: Self-pay | Admitting: Nurse Practitioner

## 2024-05-19 MED ORDER — INSULIN GLARGINE-YFGN 100 UNIT/ML ~~LOC~~ SOLN: 35.0000 [IU] | Freq: Every day | SUBCUTANEOUS | 0 refills | Status: DC

## 2024-05-19 MED ORDER — INSULIN SYRINGE-NEEDLE U-100 31G X 5/16" 1 ML MISC: 1.0000 | 3 refills | Status: DC

## 2024-05-19 NOTE — Telephone Encounter (Signed)
 Done [facility]

## 2024-05-19 NOTE — Telephone Encounter (Signed)
 Copied from CRM #[id number]. Topic: General - Other >> May 18, 2024  5:05 PM Sophia H wrote: Reason for CRM: patient is calling in again regarding his insulin  needles, states hasn't had his diabetes meds in 2 days now. Please send in asap, advised patient messages have been sent to provider to please allow more time  Done and pt was advised. KH

## 2024-05-20 ENCOUNTER — Other Ambulatory Visit: Payer: Self-pay | Admitting: Nurse Practitioner

## 2024-05-20 ENCOUNTER — Telehealth: Payer: Self-pay

## 2024-05-20 ENCOUNTER — Other Ambulatory Visit (HOSPITAL_COMMUNITY): Payer: Self-pay

## 2024-05-20 DIAGNOSIS — Z720 Tobacco use: Secondary | ICD-10-CM

## 2024-05-20 DIAGNOSIS — K8591 Acute pancreatitis with uninfected necrosis, unspecified: Secondary | ICD-10-CM

## 2024-05-20 DIAGNOSIS — E118 Type 2 diabetes mellitus with unspecified complications: Secondary | ICD-10-CM

## 2024-05-20 MED ORDER — NICOTINE 14 MG/24HR TD PT24: 14.0000 mg | MEDICATED_PATCH | Freq: Every day | TRANSDERMAL | 3 refills | Status: AC

## 2024-05-20 MED ORDER — NICOTINE 14 MG/24HR TD PT24: 14.0000 mg | MEDICATED_PATCH | Freq: Every day | TRANSDERMAL | 3 refills | Status: DC

## 2024-05-20 MED ORDER — ACCU-CHEK GUIDE ME W/DEVICE KIT: PACK | 0 refills | Status: AC

## 2024-05-20 MED ORDER — ACCU-CHEK GUIDE TEST VI STRP: ORAL_STRIP | 12 refills | Status: AC

## 2024-05-20 MED ORDER — ACCU-CHEK SOFTCLIX LANCETS MISC: 12 refills | Status: AC

## 2024-05-20 NOTE — Progress Notes (Signed)
 Contacted patient at request of PCP to confirm patient obtained correct medications post-discharge. Patient reports that he was able to pick up everything from the pharmacy, however he did not get insulin  pen needles until 2 days ago, but he has been taking insulin  since then. There is no fill hx for carvedilol , but the patient was not at home so was not able to review his medication bottles with me.   He denies s/sx of hypoglycemia, but reports that he does not have a way to monitor BG. Per test claim in [facility] - Accu Chek Meter and supplies will be covered by insurance. Will send supplies to preferred pharmacy. May be good candidate for CGM in the future. Will follow-up with patient at PCP appt on 06/08/24.  Patient reports that he has been smoking 3-5 cigarettes a day since being out of the hospital and he would like to quit. Stated that the patches worked well for him while he was admitted. Per test claim, nicotine  patch is not covered by his insurance [facility] Dual Complete). He may be eligible for free supplies via the [facility], since he is dual eligible for Medicaid. Patient was not able to write down quitline phone number and is not connected in MyChart to receive information. Sent link to connect to MyChart. In the meantime, will mail out information on [place] Quitline. Patient planning to also reach out to [place] to see what NRT he is eligible for.  Patient also reports that his L ear is feeling swollen and sore. This has been getting worsen since he left the hospital/nursing facility. Advised patient to call [facility] and move-up next appt if this continues to bother him, as it is difficult to assess the problem over the phone.   Follow-up: PCP 06/08/24 (will plan for pharmacy consult during visit)  Lorain Baseman, PharmD [facility] [phone]

## 2024-05-20 NOTE — Telephone Encounter (Signed)
 Completed.

## 2024-05-21 ENCOUNTER — Telehealth: Payer: Self-pay

## 2024-05-21 NOTE — Telephone Encounter (Signed)
 Copied from CRM #[id number]. Topic: Referral - Status >> May 21, 2024  9:14 AM Carlatta H wrote: Reason for CRM: [facility] received an incomplete referral//Fax with what was needed was sent yesterday//Fax number is [phone]

## 2024-05-21 NOTE — Telephone Encounter (Signed)
 Copied from CRM [id number]. Topic: Clinical - Medical Advice >> May 20, 2024  5:12 PM Tobias L wrote: Reason for CRM: Patient states he drinks a lot of the lipton Citrus green tea, patient inquiring if that would affect his sugar. Patient states he drinks about 5/6 or sometimes more, patient inquiring if he should stop.   Requesting callback, [phone]

## 2024-05-24 ENCOUNTER — Telehealth: Payer: Self-pay

## 2024-05-24 NOTE — Telephone Encounter (Signed)
 Copied from CRM #[id number]. Topic: General - Other >> May 24, 2024  9:15 AM Franky GRADE wrote: Reason for CRM: [facility] needs all labs from the last year and imaging in order to process the referral they received. They also need a demographics sheet with patient's information  Fax number is [phone].

## 2024-05-25 ENCOUNTER — Telehealth: Payer: Self-pay

## 2024-05-25 ENCOUNTER — Ambulatory Visit: Payer: Self-pay

## 2024-05-25 ENCOUNTER — Encounter: Payer: Self-pay | Admitting: Emergency Medicine

## 2024-05-25 ENCOUNTER — Ambulatory Visit
Admission: EM | Admit: 2024-05-25 | Discharge: 2024-05-25 | Disposition: A | Discharge status: Home / Self Care | Attending: Family Medicine | Admitting: Family Medicine

## 2024-05-25 ENCOUNTER — Other Ambulatory Visit: Payer: Self-pay

## 2024-05-25 DIAGNOSIS — D126 Benign neoplasm of colon, unspecified: Secondary | ICD-10-CM | POA: Insufficient documentation

## 2024-05-25 DIAGNOSIS — K2270 Barrett's esophagus without dysplasia: Secondary | ICD-10-CM | POA: Insufficient documentation

## 2024-05-25 DIAGNOSIS — Z860100 Personal history of colon polyps, unspecified: Secondary | ICD-10-CM | POA: Insufficient documentation

## 2024-05-25 DIAGNOSIS — I10 Essential (primary) hypertension: Secondary | ICD-10-CM | POA: Insufficient documentation

## 2024-05-25 DIAGNOSIS — Z122 Encounter for screening for malignant neoplasm of respiratory organs: Secondary | ICD-10-CM | POA: Insufficient documentation

## 2024-05-25 DIAGNOSIS — M545 Low back pain, unspecified: Secondary | ICD-10-CM | POA: Insufficient documentation

## 2024-05-25 DIAGNOSIS — M87059 Idiopathic aseptic necrosis of unspecified femur: Secondary | ICD-10-CM | POA: Insufficient documentation

## 2024-05-25 DIAGNOSIS — H60392 Other infective otitis externa, left ear: Secondary | ICD-10-CM | POA: Diagnosis not present

## 2024-05-25 DIAGNOSIS — Z1211 Encounter for screening for malignant neoplasm of colon: Secondary | ICD-10-CM | POA: Insufficient documentation

## 2024-05-25 DIAGNOSIS — F411 Generalized anxiety disorder: Secondary | ICD-10-CM | POA: Insufficient documentation

## 2024-05-25 DIAGNOSIS — M2550 Pain in unspecified joint: Secondary | ICD-10-CM | POA: Insufficient documentation

## 2024-05-25 DIAGNOSIS — B070 Plantar wart: Secondary | ICD-10-CM | POA: Insufficient documentation

## 2024-05-25 DIAGNOSIS — N529 Male erectile dysfunction, unspecified: Secondary | ICD-10-CM | POA: Insufficient documentation

## 2024-05-25 DIAGNOSIS — E785 Hyperlipidemia, unspecified: Secondary | ICD-10-CM | POA: Insufficient documentation

## 2024-05-25 DIAGNOSIS — B1920 Unspecified viral hepatitis C without hepatic coma: Secondary | ICD-10-CM | POA: Insufficient documentation

## 2024-05-25 DIAGNOSIS — Z7189 Other specified counseling: Secondary | ICD-10-CM | POA: Insufficient documentation

## 2024-05-25 DIAGNOSIS — H40013 Open angle with borderline findings, low risk, bilateral: Secondary | ICD-10-CM | POA: Insufficient documentation

## 2024-05-25 DIAGNOSIS — M25511 Pain in right shoulder: Secondary | ICD-10-CM | POA: Insufficient documentation

## 2024-05-25 DIAGNOSIS — G43909 Migraine, unspecified, not intractable, without status migrainosus: Secondary | ICD-10-CM | POA: Insufficient documentation

## 2024-05-25 DIAGNOSIS — M25559 Pain in unspecified hip: Secondary | ICD-10-CM | POA: Insufficient documentation

## 2024-05-25 DIAGNOSIS — E119 Type 2 diabetes mellitus without complications: Secondary | ICD-10-CM | POA: Insufficient documentation

## 2024-05-25 DIAGNOSIS — K2100 Gastro-esophageal reflux disease with esophagitis, without bleeding: Secondary | ICD-10-CM | POA: Insufficient documentation

## 2024-05-25 DIAGNOSIS — I639 Cerebral infarction, unspecified: Secondary | ICD-10-CM | POA: Insufficient documentation

## 2024-05-25 DIAGNOSIS — F331 Major depressive disorder, recurrent, moderate: Secondary | ICD-10-CM | POA: Insufficient documentation

## 2024-05-25 DIAGNOSIS — F32A Depression, unspecified: Secondary | ICD-10-CM | POA: Insufficient documentation

## 2024-05-25 DIAGNOSIS — M75120 Complete rotator cuff tear or rupture of unspecified shoulder, not specified as traumatic: Secondary | ICD-10-CM | POA: Insufficient documentation

## 2024-05-25 DIAGNOSIS — K449 Diaphragmatic hernia without obstruction or gangrene: Secondary | ICD-10-CM | POA: Insufficient documentation

## 2024-05-25 DIAGNOSIS — F1920 Other psychoactive substance dependence, uncomplicated: Secondary | ICD-10-CM | POA: Insufficient documentation

## 2024-05-25 DIAGNOSIS — Z789 Other specified health status: Secondary | ICD-10-CM | POA: Insufficient documentation

## 2024-05-25 DIAGNOSIS — M25552 Pain in left hip: Secondary | ICD-10-CM | POA: Insufficient documentation

## 2024-05-25 DIAGNOSIS — Z720 Tobacco use: Secondary | ICD-10-CM | POA: Insufficient documentation

## 2024-05-25 DIAGNOSIS — R9430 Abnormal result of cardiovascular function study, unspecified: Secondary | ICD-10-CM | POA: Insufficient documentation

## 2024-05-25 MED ORDER — OFLOXACIN 0.3 % OT SOLN: 5.0000 [drp] | Freq: Two times a day (BID) | OTIC | 0 refills | Status: AC

## 2024-05-25 NOTE — Telephone Encounter (Signed)
 Copied from CRM [id number]. Topic: Clinical - Home Health Verbal Orders >> May 25, 2024  2:20 PM Rosaria BRAVO wrote: Caller/Agency: Isaiah Gaba  Callback Number: [phone] Service Requested: Physical Therapy Assistant  Low pulse rate of 52 beats per minute, asymptomatic, swelling in ankles (Routed to triage as well)  Any new concerns about the patient? Having an issue with his left ear, going to urgent care. No pain, just bothersome.

## 2024-05-25 NOTE — Discharge Instructions (Signed)
 Please continue to take all your medications, including furosemide  and spironolactone  as prescribed.  -Floxin  eardrops-5 drops in the affected ear 2 times daily for 7 days.  Try getting some compression socks from [employer] and putting them on first thing in the morning.  This can help to keep fluid from pooling in your feet and ankles.  Also do not eat too much salt.  Keep drinking plenty of fluids.  Follow-up with your primary care

## 2024-05-25 NOTE — ED Triage Notes (Signed)
 Pt here for left ear pain and swelling x 4 days; pt sts some bilateral leg swelling x 1 week; denies injury but sts some change in meds at recent hospitalization; denies being on water pill

## 2024-05-25 NOTE — Telephone Encounter (Signed)
 FYI Only or Action Required?: Action required by provider: update on patient condition and patient to be seen at [facility] for ankle swelling and ear pain.  Patient was last seen in primary care on 05/17/2024 by [name], NP.  Called Nurse Triage reporting low heart rate and Joint Swelling.  Symptoms began 2 weeks ago.  Interventions attempted: Rest, hydration, or home remedies.  Symptoms are: unchanged.  Triage Disposition: See PCP When Office is Open (Within 3 Days), See HCP Within 4 Hours (Or PCP Triage)-patient to be evaluated by Urgent care for ankle swelling and ear pain.   Patient/caregiver understands and will follow disposition?: Yes  Copied from CRM [id number]. Topic: Clinical - Red Word Triage >> May 25, 2024  2:23 PM Rosaria BRAVO wrote: Red Word that prompted transfer to Nurse Triage: Caller/Agency: Isaiah Gaba  Callback Number: [phone] Service Requested: Physical Therapy Assistant  Low pulse rate of 52 beats per minute, asymptomatic, swelling in ankles  Any new concerns about the patient? Having an issue with his left ear, going to urgent care. No pain, just bothersome. Reason for Disposition  Age > 60 years  (Exception: Brief heartbeat symptoms that went away and now feels well.)  MODERATE ankle swelling (e.g., interferes with normal activities, can't move joint normally) (Exceptions: Itchy, localized swelling; swelling is chronic.)  Answer Assessment - Initial Assessment Questions 1. LOCATION: Which ankle is swollen? Where is the swelling?     Both ankles.swelling is located on the dorsal side of the ankle 2. ONSET: When did the swelling start?     Swelling has been going on since coming home from the nursing home.  3. SWELLING: How bad is the swelling? Or, How large is it? (e.g., mild, moderate, severe; size of localized swelling)      Mild-moderate 4. PAIN: Is there any pain? If Yes, ask: How bad is it? (Scale 0-10; or none, mild, moderate,  severe)      Slightly sore when pressure is placed 5. CAUSE: What do you think caused the ankle swelling?     unsure 6. OTHER SYMPTOMS: Do you have any other symptoms? (e.g., fever, chest pain, difficulty breathing, calf pain)     Left ear discomfort.  Answer Assessment - Initial Assessment Questions 1. DESCRIPTION: Please describe your heart rate or heartbeat that you are having (e.g., fast/slow, regular/irregular, skipped or extra beats, palpitations)     Low heart rate 2. ONSET: When did it start? (e.g., minutes, hours, days)      Reported by PTA today during home health visit. Documented HR of 50 at last office visit 7/21 6. HEART RATE: Can you tell me your heart rate? How many beats in 15 seconds?  Note: Not all patients can do this.       52 per PT 7. RECURRENT SYMPTOM: Have you ever had this before? If Yes, ask: When was the last time? and What happened that time?      Last HR documented in office was 50 9. CARDIAC HISTORY: Do you have any history of heart disease? (e.g., heart attack, angina, bypass surgery, angioplasty, arrhythmia)      HTN 10. OTHER SYMPTOMS: Do you have any other symptoms? (e.g., dizziness, chest pain, sweating, difficulty breathing)       No other symptoms  Protocols used: Ankle Swelling-A-AH, Heart Rate and Heartbeat Questions-A-AH

## 2024-05-25 NOTE — ED Provider Notes (Signed)
 [facility]    CSN: [id number] Arrival date & time: 05/25/24  1517      History   Chief Complaint Chief Complaint  Patient presents with   Ear Pain   Leg Swelling    HPI [name] is a 68 y.o. male.   HPI Here for about a 2-week history of left ear pain.  He does feel like it may have gotten a little worse after he put a Q-tip in the ear and had a little blood come back out on it.  No fever or nasal congestion.  He also notes some swelling in his legs, mainly in his ankles.  He has noted this ever since he came out of the nursing home.  He was in rehab after having an extended hospital mission for pancreatitis.  Initially he states he is not taking any diuretics.  He does report compliance with his medications, however, and was sent in furosemide  and spironolactone  on July 21 by his primary care.  He is also newly on Eliquis  for the thrombosed mesenteric artery.  He is allergic to lisinopril , citalopram, morphine , NSAIDs, and paroxetine Past Medical History:  Diagnosis Date   Alcohol abuse    Allergic rhinitis    CHF (congestive heart failure) (HCC)    Diabetes mellitus without complication (HCC)    'sometime last yr'--type 2   GERD (gastroesophageal reflux disease)    Hepatitis    he thinks its hep b or c   Hyperlipidemia    Hypertension    Osteoarthritis    Pancreatitis    Rheumatoid arthritis(714.0)    Stroke (HCC)    Tobacco user     Patient Active Problem List   Diagnosis Date Noted   Abnormal result of cardiovascular function study 05/25/2024   Depression 05/25/2024   Adenomatous polyp of colon 05/25/2024   Arthralgia 05/25/2024   Avascular necrosis of bone of hip (HCC) 05/25/2024   Barrett's esophagus without dysplasia 05/25/2024   Cerebral infarction (HCC) 05/25/2024   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, abuse (HCC) 05/25/2024   Complete tear of rotator cuff 05/25/2024   Diaphragmatic hernia 05/25/2024    Gastro-esophageal reflux disease with esophagitis, without bleeding 05/25/2024   Generalized anxiety disorder 05/25/2024   Hepatitis C virus infection 05/25/2024   History of colonic polyps 05/25/2024   Migraine headache 05/25/2024   Impotence of organic origin 05/25/2024   Moderate episode of recurrent major depressive disorder (HCC) 05/25/2024   Other specified health status 05/25/2024   Open angle with borderline findings, low risk, bilateral 05/25/2024   Plantar wart 05/25/2024   Pain in left hip 05/25/2024   Hip pain 05/25/2024   Low back pain 05/25/2024   Hypertensive disorder 05/25/2024   Essential (primary) hypertension 05/25/2024   Encounter for screening for malignant neoplasm of colon 05/25/2024   Other specified counseling 05/25/2024   Encounter for screening for malignant neoplasm of respiratory organs 05/25/2024   Hyperlipidemia 05/25/2024   Pain in right shoulder 05/25/2024   Tobacco user 05/25/2024   Type 2 diabetes mellitus without complications (HCC) 05/25/2024   Diabetes mellitus (HCC) 05/25/2024   Hemoptysis 04/25/2024   Acute hypoxemic respiratory failure (HCC) 04/25/2024   Malnutrition of moderate degree 04/21/2024   Pancreatitis, alcoholic, acute 04/19/2024   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/2023   Preop examination 10/28/2023   Osteoarthritis of left hip 10/28/2023   Hyperlipidemia LDL goal <55 10/28/2023   Alcoholic intoxication without complication (HCC) 05/16/2021   Marijuana  use 05/16/2021   CVA (cerebral vascular accident) (HCC) 05/14/2021   Alcohol abuse 05/14/2021   Tobacco abuse 05/14/2021   Type 2 diabetes mellitus with complication, without long-term current use of insulin  (HCC) 05/14/2021   Angioedema 02/09/2017   Primary localized osteoarthritis of right hip 01/02/2016   Groin rash 06/16/2013   DM w/o complication type II (HCC) 06/16/2013   BPH (benign prostatic hyperplasia) 11/24/2012   Healthcare maintenance 11/24/2012    Pancreatitis 10/31/2012   Chronic pain syndrome 09/18/2011   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/19/2011   Right rotator cuff tear 07/26/2011   Shoulder pain, right 05/03/2011   Right ankle pain 01/23/2011   Left wrist pain 01/23/2011   Back pain 01/02/2011   MOLLUSCUM CONTAGIOSUM 12/05/2008   LEUKOCYTOSIS UNSPECIFIED 03/25/2008   LIVER FUNCTION TESTS, ABNORMAL 03/25/2008   ERECTILE DYSFUNCTION 05/12/2007   Essential hypertension 03/02/2007   Hypercholesteremia 01/08/2007   ANXIETY 01/08/2007   GERD 01/08/2007   Rheumatoid arthritis (HCC) 01/08/2007   OSTEOARTHRITIS 01/08/2007   Cannabis abuse 04/09/1993   Cardiomegaly 04/09/1993   Cocaine dependence in remission (HCC) 04/09/1993   Alcohol dependence, continuous (HCC) 08/11/1992    Past Surgical History:  Procedure Laterality Date   COLONOSCOPY     FRACTURE SURGERY     cheek bone fracture    [facility]     right shoulder  -- 2010   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   Procedure: TOTAL HIP ARTHROPLASTY ANTERIOR APPROACH;  Surgeon: [name], MD;  Location: [facility] OR;  Service: Orthopedics;  Laterality: Right;   TOTAL HIP ARTHROPLASTY Left 12/02/2023   Procedure: TOTAL HIP ARTHROPLASTY ANTERIOR APPROACH;  Surgeon: Chancy Evalene JONETTA, MD;  Location: [facility];  Service: Orthopedics;  Laterality: Left;   WRIST SURGERY     fusion with pins  2007       Home Medications    Prior to Admission medications   Medication Sig Start Date End Date Taking? Authorizing Provider  atorvastatin  (LIPITOR) 40 MG tablet Take 40 mg by mouth at bedtime. 05/18/24  Yes [provider]  LANTUS  100 UNIT/ML injection Inject 0.35 mLs into the skin daily. 05/17/24  Yes [provider]  metFORMIN  (GLUCOPHAGE ) 500 MG tablet Take 500 mg by mouth daily. 05/18/24  Yes [provider]  ofloxacin  (FLOXIN ) 0.3 % OTIC solution Place 5 drops into both ears 2 (two) times daily for 7 days. 05/25/24 06/01/24 Yes Vonna Sharlet POUR, MD  oxyCODONE -acetaminophen   (PERCOCET) 10-325 MG tablet Take 1 tablet by mouth 5 (five) times daily. 05/18/24  Yes [provider]  traZODone  (DESYREL ) 100 MG tablet Take 100 mg by mouth at bedtime as needed. 05/13/24  Yes [provider]  Accu-Chek Softclix Lancets lancets Use as instructed to monitor blood glucose twice daily 05/20/24   Nichols, Tonya S, NP  amLODipine  (NORVASC ) 10 MG tablet Take 1 tablet (10 mg total) by mouth daily. 04/02/24   [name], NP  apixaban  (ELIQUIS ) 5 MG TABS tablet Take 1 tablet (5 mg total) by mouth 2 (two) times daily. 05/17/24 06/16/24  [name], NP  Blood Glucose Monitoring Suppl (ACCU-CHEK GUIDE ME) w/Device KIT Use as directed to monitor blood glucose 05/20/24   [name], NP  carvedilol  (COREG ) 12.5 MG tablet Take 1 tablet (12.5 mg total) by mouth 2 (two) times daily with a meal. 05/06/24 06/05/24  Vernon Ranks, MD  folic acid  (FOLVITE ) 1 MG tablet Take 1 tablet (1 mg total) by mouth daily. 05/17/24 06/16/24  Nichols, Tonya  S, NP  furosemide  (LASIX ) 40 MG tablet Take 1 tablet (40 mg total) by mouth daily. 05/17/24   [name], NP  glucose blood (ACCU-CHEK GUIDE TEST) test strip Use as instructed to monitor blood glucose twice daily 05/20/24   Nichols, Tonya S, NP  insulin  glargine-yfgn (SEMGLEE ) 100 UNIT/ML injection Inject 0.35 mLs (35 Units total) into the skin daily. 05/19/24   [name], NP  Insulin  Syringe-Needle U-100 (DROPSAFE SAFETY SYRINGE/NEEDLE) 31G X 5/16 1 ML MISC 1 each by Does not apply route as directed. 05/19/24   [name], NP  nicotine  (NICODERM CQ  - DOSED IN MG/24 HOURS) 14 mg/24hr patch Place 1 patch (14 mg total) onto the skin daily. Change every 24 hours. 05/20/24   [name], NP  oxyCODONE  10 MG TABS Take 1 tablet (10 mg total) by mouth every 6 (six) hours as needed for moderate pain (pain score 4-6) or breakthrough pain. 05/06/24   Vernon Ranks, MD  spironolactone  (ALDACTONE ) 25 MG tablet Take 1 tablet (25 mg  total) by mouth daily. 05/17/24   [name], NP  tamsulosin  (FLOMAX ) 0.4 MG CAPS capsule Take 2 capsules (0.8 mg total) by mouth daily. 06/20/22   [name], NP  thiamine  (VITAMIN B-1) 100 MG tablet Take 1 tablet (100 mg total) by mouth daily. 05/17/24 06/16/24  [name], NP    Family History Family History  Problem Relation Age of Onset   Kidney disease Brother    Angioedema Maternal Aunt        Tongue swelling   Heart attack Mother    Heart attack Father     Social History Social History   Tobacco Use   Smoking status: Every Day    Current packs/day: 0.40    Average packs/day: 0.4 packs/day for 25.0 years (10.0 ttl pk-yrs)    Types: Cigarettes    Passive exposure: Current   Smokeless tobacco: Never   Tobacco comments:    Trying to quit smoking.  Currently smoking 1 -2 cigarette per day and is doing OK with decrease.  Vaping Use   Vaping status: Never Used  Substance Use Topics   Alcohol use: Yes    Alcohol/week: 7.0 - 14.0 standard drinks of alcohol    Types: 7 - 14 Cans of beer per week    Comment: 4 beer/day per family   Drug use: Not Currently    Comment: hx of 20 years ago -cocaine  and marijuanan     Allergies   Lisinopril , Citalopram, Morphine , Nsaids, and Paroxetine   Review of Systems Review of Systems   Physical Exam Triage Vital Signs ED Triage Vitals  Encounter Vitals Group     BP 05/25/24 1530 (!) 146/80     Girls Systolic BP Percentile --      Girls Diastolic BP Percentile --      Boys Systolic BP Percentile --      Boys Diastolic BP Percentile --      Pulse Rate 05/25/24 1530 (!) 53     Resp 05/25/24 1530 18     Temp 05/25/24 1530 97.9 F (36.6 C)     Temp Source 05/25/24 1530 Oral     SpO2 05/25/24 1530 98 %     Weight 05/25/24 1532 150 lb 8 oz (68.3 kg)     Height --      Head Circumference --      Peak Flow --      Pain Score  05/25/24 1530 4     Pain Loc --      Pain Education --      Exclude from Growth Chart  --    No data found.  Updated Vital Signs BP (!) 146/80   Pulse (!) 53   Temp 97.9 F (36.6 C) (Oral)   Resp 18   Wt 68.3 kg   SpO2 98%   BMI 23.57 kg/m   Visual Acuity Right Eye Distance:   Left Eye Distance:   Bilateral Distance:    Right Eye Near:   Left Eye Near:    Bilateral Near:     Physical Exam Vitals reviewed.  Constitutional:      General: He is not in acute distress.    Appearance: He is not ill-appearing, toxic-appearing or diaphoretic.  HENT:     Right Ear: Tympanic membrane and ear canal normal.     Ears:     Comments: There is some cerumen obscuring view of the left brain.  The left ear canal is mildly swollen.  There is a little tenderness of the tragus.    Mouth/Throat:     Mouth: Mucous membranes are moist.  Eyes:     Extraocular Movements: Extraocular movements intact.     Conjunctiva/sclera: Conjunctivae normal.     Pupils: Pupils are equal, round, and reactive to light.  Cardiovascular:     Rate and Rhythm: Normal rate and regular rhythm.     Heart sounds: No murmur heard. Pulmonary:     Effort: Pulmonary effort is normal.     Breath sounds: Normal breath sounds.  Abdominal:     Palpations: Abdomen is soft.  Musculoskeletal:     Cervical back: Neck supple.     Comments: There is 1+ edema of the ankles.  No erythema.  Skin:    Coloration: Skin is not jaundiced or pale.  Neurological:     General: No focal deficit present.     Mental Status: He is alert and oriented to person, place, and time.  Psychiatric:        Behavior: Behavior normal.      UC Treatments / Results  Labs (all labs ordered are listed, but only abnormal results are displayed) Labs Reviewed - No data to display  EKG   Radiology No results found.  Procedures Procedures (including critical care time)  Medications Ordered in UC Medications - No data to display  Initial Impression / Assessment and Plan / UC Course  I have reviewed the triage vital signs  and the nursing notes.  Pertinent labs & imaging results that were available during my care of the patient were reviewed by me and considered in my medical decision making (see chart for details).     Floxin  is sent in for potential otitis externa.  I asked him to get some compression socks from [employer] to start with.  He will also limit his salt and make sure he is drinking plenty of water.  He has follow-up with primary care in another 2 weeks or so. Final Clinical Impressions(s) / UC Diagnoses   Final diagnoses:  Infective otitis externa of left ear     Discharge Instructions      Please continue to take all your medications, including furosemide  and spironolactone  as prescribed.  -Floxin  eardrops-5 drops in the affected ear 2 times daily for 7 days.  Try getting some compression socks from [employer] and putting them on first thing in the morning.  This can help  to keep fluid from pooling in your feet and ankles.  Also do not eat too much salt.  Keep drinking plenty of fluids.  Follow-up with your primary care    ED Prescriptions     Medication Sig Dispense Auth. Provider   ofloxacin  (FLOXIN ) 0.3 % OTIC solution Place 5 drops into both ears 2 (two) times daily for 7 days. 5 mL Vonna Sharlet POUR, MD      PDMP not reviewed this encounter.   Vonna Sharlet POUR, MD 05/25/24 [phone]

## 2024-06-08 ENCOUNTER — Other Ambulatory Visit: Payer: Self-pay | Admitting: Nurse Practitioner

## 2024-06-08 ENCOUNTER — Ambulatory Visit (INDEPENDENT_AMBULATORY_CARE_PROVIDER_SITE_OTHER): Payer: Self-pay | Admitting: Nurse Practitioner

## 2024-06-08 ENCOUNTER — Encounter: Payer: Self-pay | Admitting: Nurse Practitioner

## 2024-06-08 VITALS — BP 126/64 | HR 56 | Temp 97.9°F | Wt 146.2 lb

## 2024-06-08 DIAGNOSIS — H9202 Otalgia, left ear: Secondary | ICD-10-CM | POA: Diagnosis not present

## 2024-06-08 DIAGNOSIS — E118 Type 2 diabetes mellitus with unspecified complications: Secondary | ICD-10-CM | POA: Diagnosis not present

## 2024-06-08 MED ORDER — OFLOXACIN 0.3 % OT SOLN: 5.0000 [drp] | Freq: Two times a day (BID) | OTIC | 0 refills | Status: AC

## 2024-06-08 NOTE — Progress Notes (Signed)
 Subjective   Patient ID: [name], male    DOB: [birth year], 68 y.o.   MRN: [id number]  Chief Complaint  Patient presents with   Medical Management of Chronic Issues   Ear Pain    Left ear pain    Referring provider: [name], NP  [name] is a 68 y.o. male with Past Medical History: No date: Alcohol abuse No date: Allergic rhinitis No date: CHF (congestive heart failure) (HCC) No date: Diabetes mellitus without complication (HCC)     Comment:  'sometime last yr'--type 2 No date: GERD (gastroesophageal reflux disease) No date: Hepatitis     Comment:  he thinks its hep b or c No date: Hyperlipidemia No date: Hypertension No date: Osteoarthritis No date: Pancreatitis No date: Rheumatoid arthritis(714.0) No date: Stroke [facility]) No date: Tobacco user   HPI  Patient presents today for 2-week follow-up.  He was seen here 2 weeks ago for hospital follow-up.  He has been referred to liver specialist and does have an appointment scheduled for this we will get him an appointment scheduled with cardiology today.  He has been to the ED since his last visit here for ear pain.  He was prescribed eardrops. Denies f/c/s, n/v/d, hemoptysis, PND, leg swelling. Denies chest pain or edema.    Allergies  Allergen Reactions   Lisinopril  Swelling and Other (See Comments)    angioedema   Citalopram Nausea Only   Morphine  Nausea And Vomiting   Nsaids Other (See Comments)    Stomach Irritability    Paroxetine Nausea And Vomiting    Immunization History  Administered Date(s) Administered   Dtap, Unspecified 07/24/1967   Fluad Quad(high Dose 65+) 12/13/2021, 10/11/2022   Influenza Whole 10/17/2010   Influenza, High Dose Seasonal PF 07/23/2023   Influenza, Seasonal, Injecte, Preservative Fre 10/06/2012, 07/27/2013, 11/23/2014   Influenza,inj,Quad PF,6+ Mos 09/09/2016, 10/01/2017, 11/13/2018   Influenza-Unspecified 10/04/2004, 08/28/2010, 09/25/2011, 10/28/2012,  10/11/2022   PFIZER(Purple Top)SARS-COV-2 Vaccination 05/17/2020, 06/08/2020   PNEUMOCOCCAL CONJUGATE-20 01/13/2023   Pneumococcal Polysaccharide-23 01/24/2004, 02/12/2017   Polio, Unspecified 07/24/1967   Respiratory Syncytial Virus Vaccine,Recomb Aduvanted(Arexvy) 10/11/2022   Td 10/17/2010   Td (Adult),5 Lf Tetanus Toxid, Preservative Free 12/13/2021   Tdap 05/19/2012, 08/01/2020    Tobacco History: Social History   Tobacco Use  Smoking Status Every Day   Current packs/day: 0.40   Average packs/day: 0.4 packs/day for 25.0 years (10.0 ttl pk-yrs)   Types: Cigarettes   Passive exposure: Current  Smokeless Tobacco Never  Tobacco Comments   Trying to quit smoking.  Currently smoking 1 -2 cigarette per day and is doing OK with decrease.   Ready to quit: Not Answered Counseling given: Yes Tobacco comments: Trying to quit smoking.  Currently smoking 1 -2 cigarette per day and is doing OK with decrease.   Outpatient Encounter Medications as of 06/08/2024  Medication Sig   Accu-Chek Softclix Lancets lancets Use as instructed to monitor blood glucose twice daily   amLODipine  (NORVASC ) 10 MG tablet Take 1 tablet (10 mg total) by mouth daily.   apixaban  (ELIQUIS ) 5 MG TABS tablet Take 1 tablet (5 mg total) by mouth 2 (two) times daily.   atorvastatin  (LIPITOR) 40 MG tablet Take 40 mg by mouth at bedtime.   Blood Glucose Monitoring Suppl (ACCU-CHEK GUIDE ME) w/Device KIT Use as directed to monitor blood glucose   carvedilol  (COREG ) 12.5 MG tablet Take 1 tablet (12.5 mg total) by mouth 2 (two) times daily with a meal.  folic acid  (FOLVITE ) 1 MG tablet Take 1 tablet (1 mg total) by mouth daily.   furosemide  (LASIX ) 40 MG tablet Take 1 tablet (40 mg total) by mouth daily.   glucose blood (ACCU-CHEK GUIDE TEST) test strip Use as instructed to monitor blood glucose twice daily   insulin  glargine-yfgn (SEMGLEE ) 100 UNIT/ML injection Inject 0.35 mLs (35 Units total) into the skin daily.    Insulin  Syringe-Needle U-100 (DROPSAFE SAFETY SYRINGE/NEEDLE) 31G X 5/16 1 ML MISC 1 each by Does not apply route as directed.   LANTUS  100 UNIT/ML injection Inject 0.35 mLs into the skin daily.   metFORMIN  (GLUCOPHAGE ) 500 MG tablet Take 500 mg by mouth daily.   nicotine  (NICODERM CQ  - DOSED IN MG/24 HOURS) 14 mg/24hr patch Place 1 patch (14 mg total) onto the skin daily. Change every 24 hours.   ofloxacin  (FLOXIN ) 0.3 % OTIC solution Place 5 drops into the left ear 2 (two) times daily.   oxyCODONE  10 MG TABS Take 1 tablet (10 mg total) by mouth every 6 (six) hours as needed for moderate pain (pain score 4-6) or breakthrough pain.   oxyCODONE -acetaminophen  (PERCOCET) 10-325 MG tablet Take 1 tablet by mouth 5 (five) times daily.   spironolactone  (ALDACTONE ) 25 MG tablet Take 1 tablet (25 mg total) by mouth daily.   tamsulosin  (FLOMAX ) 0.4 MG CAPS capsule Take 2 capsules (0.8 mg total) by mouth daily.   thiamine  (VITAMIN B-1) 100 MG tablet Take 1 tablet (100 mg total) by mouth daily.   traZODone  (DESYREL ) 100 MG tablet Take 100 mg by mouth at bedtime as needed.   No facility-administered encounter medications on file as of 06/08/2024.    Review of Systems  Review of Systems  Constitutional: Negative.   HENT: Negative.    Cardiovascular: Negative.   Gastrointestinal: Negative.   Allergic/Immunologic: Negative.   Neurological: Negative.   Psychiatric/Behavioral: Negative.       Objective:   BP 126/64   Pulse (!) 56   Temp 97.9 F (36.6 C) (Oral)   Wt 146 lb 3.2 oz (66.3 kg)   SpO2 100%   BMI 22.90 kg/m   Wt Readings from Last 5 Encounters:  06/08/24 146 lb 3.2 oz (66.3 kg)  05/25/24 150 lb 8 oz (68.3 kg)  05/17/24 146 lb (66.2 kg)  05/06/24 143 lb 8 oz (65.1 kg)  04/02/24 147 lb (66.7 kg)     Physical Exam Vitals and nursing note reviewed.  Constitutional:      General: He is not in acute distress.    Appearance: He is well-developed.  Cardiovascular:     Rate and  Rhythm: Normal rate and regular rhythm.  Pulmonary:     Effort: Pulmonary effort is normal.     Breath sounds: Normal breath sounds.  Skin:    General: Skin is warm and dry.  Neurological:     Mental Status: He is alert and oriented to person, place, and time.       Assessment & Plan:   Left ear pain -     Ambulatory referral to ENT -     Ofloxacin ; Place 5 drops into the left ear 2 (two) times daily.  Dispense: 5 mL; Refill: 0  Type 2 diabetes mellitus with complication, without long-term current use of insulin  (HCC) -     AMB Referral [facility]     Return in about 3 months (around 09/08/2024).   Bascom GORMAN Borer, NP 06/08/2024
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
## 2024-06-10 ENCOUNTER — Ambulatory Visit: Payer: Self-pay

## 2024-06-10 NOTE — Telephone Encounter (Signed)
 Pt states blood sugar of 902. States taking medications as rx'd. Advised to go to ED.  FYI Only or Action Required?: FYI only for provider.  Patient was last seen in primary care on 06/08/2024 by [name], NP.  Called Nurse Triage reporting Hyperglycemia.  Symptoms began today.  Interventions attempted: Prescription medications: insulin  and Rest, hydration, or home remedies.  Symptoms are: unchanged.  Triage Disposition: Go to ED Now (or PCP Triage)  Patient/caregiver understands and will follow disposition?: yes  Copied from CRM #[id number]. Topic: Clinical - Red Word Triage >> Jun 10, 2024  2:53 PM [name] wrote: Red Word that prompted transfer to Nurse Triage: Pt calling in stating that his blood sugar is 902. Pt does take insulin  but has already taken it for today. Warm transferred to nurse triage. Reason for Disposition  Blood glucose > 500 mg/dL (72.1 mmol/L)  Protocols used: Diabetes - High Blood Sugar-A-AH

## 2024-06-13 ENCOUNTER — Other Ambulatory Visit: Payer: Self-pay | Admitting: Nurse Practitioner

## 2024-06-14 ENCOUNTER — Ambulatory Visit: Attending: Cardiology | Admitting: Cardiology

## 2024-06-14 ENCOUNTER — Encounter: Payer: Self-pay | Admitting: Cardiology

## 2024-06-14 VITALS — BP 138/62 | HR 60 | Ht 67.0 in | Wt 147.3 lb

## 2024-06-14 DIAGNOSIS — I10 Essential (primary) hypertension: Secondary | ICD-10-CM

## 2024-06-14 DIAGNOSIS — E1169 Type 2 diabetes mellitus with other specified complication: Secondary | ICD-10-CM | POA: Insufficient documentation

## 2024-06-14 DIAGNOSIS — E118 Type 2 diabetes mellitus with unspecified complications: Secondary | ICD-10-CM

## 2024-06-14 DIAGNOSIS — E785 Hyperlipidemia, unspecified: Secondary | ICD-10-CM

## 2024-06-14 DIAGNOSIS — I6302 Cerebral infarction due to thrombosis of basilar artery: Secondary | ICD-10-CM | POA: Diagnosis not present

## 2024-06-14 DIAGNOSIS — Z794 Long term (current) use of insulin: Secondary | ICD-10-CM

## 2024-06-14 DIAGNOSIS — I5189 Other ill-defined heart diseases: Secondary | ICD-10-CM | POA: Insufficient documentation

## 2024-06-14 DIAGNOSIS — F1010 Alcohol abuse, uncomplicated: Secondary | ICD-10-CM

## 2024-06-14 DIAGNOSIS — Z720 Tobacco use: Secondary | ICD-10-CM

## 2024-06-14 NOTE — Assessment & Plan Note (Signed)
 A1c as of April 02, 2024 was 6.1.  He is currently on Lantus  after leaving the hospital, no longer on Farxiga  or metformin .  Defer to PCP but appears to be pretty well-controlled.

## 2024-06-14 NOTE — Assessment & Plan Note (Signed)
 Echocardiogram reveals grade 1 diastolic function with mild LVH not unexpected for his age with hypertension.  He had some volume overload issues in the hospital after he has been treated for sepsis with necrotizing pancreatitis.  He probably got excess fluids and required diuresis.  Currently asymptomatic now with no PND orthopnea and edema seems very well-controlled.  He is on oral furosemide  40 mg daily along with spironolactone  25 mg daily in conjunction with his amlodipine  10 mg daily and carvedilol  which I am increasing to 12.5 mg twice daily.  - For now continue current medications but low threshold to actually make the Lasix  PRN once his edema seems to be fully resolved. - With heart rate in the 60s, no real room to titrate carvedilol  to much further than 12.5 mg twice daily.  Next medication to reinitiate would be an ARB.

## 2024-06-14 NOTE — Patient Instructions (Addendum)
 Medication Instructions:   TAKE YOUR CARVEDILOL  12.5 MG   TWICE A DAY *If you need a refill on your cardiac medications before your next appointment, please call your pharmacy*   Lab Work:  NOT NEEDED  Testing/Procedures:  NOT NEEDED  Follow-Up: At [facility] you and your health needs are our priority.  As part of our continuing mission to provide you with exceptional heart care, we have created designated Provider Care Teams.  These Care Teams include your primary Cardiologist (physician) and Advanced Practice Providers (APPs -  Physician Assistants and Nurse Practitioners) who all work together to provide you with the care you need, when you need it.     Your next appointment:   12 month(s)  The format for your next appointment:   In Person  Provider:   Alm Clay, MD

## 2024-06-14 NOTE — Assessment & Plan Note (Signed)
 Blood pressure little elevated today, but he has been taking his carvedilol  once daily-had previously.  Written for carvedilol  CR which he could not afford but that was once daily.  Had previously been on chlorthalidone, because of congestive arthropathy noted in the hospital, he was placed on spironolactone  and furosemide  instead. - Increase carvedilol  to 12.5 mg twice daily and continue amlodipine  10 mg daily. - Continue spironolactone  25 mg daily -Seems euvolemic but reasonable to continue on furosemide  40 mg daily for now.

## 2024-06-14 NOTE — Assessment & Plan Note (Signed)
 Alcohol-related pancreatitis.  Stressed the importance of complete abstinence.  He has been through the withdrawal period and hopes to continue to stay abstinent.

## 2024-06-14 NOTE — Progress Notes (Signed)
 Cardiology Office Note:  .   Date:  06/14/2024  ID:  [name], DOB Mar 12, 1956, MRN [id number] PCP: Brian Bascom RAMAN, NP  [facility] Providers Cardiologist:  Brian Clay, MD     Chief Complaint  Patient presents with   New Patient (Initial Visit)    Diastolic heart failure ?     Patient Profile: .     [name] is a chronically ill-appearing 68 y.o. male smoker (has cut down to 3-4/day) with a PMH notable for prior CVA,  HTN, HLD, DM-2, EtOH (h/o Necrotizing Pancreatitis) who presents here for at the request of Brian Bascom RAMAN, NP.  [name] was last seen by Dr. Alveta in 2017.  This was simply a preop evaluation was doing well. PMH: History of CVA July 2022: Started on Plavix  along with statin [name].  Remote nonhemorrhagic lacunar infarct in the right thalamus and posterior right cerebellum is close white matter atrophy. 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  Prior to recent hospitalization he had Xarelto  10 mg daily listed which was postop prophylaxis from his surgery in February.  After discharge from his recent hospitalization he was placed on Eliquis  5 mg twice daily for splenic vein thrombosis and Plavix  was stopped.     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  He also developed SMV/splenic vein thrombosis and was eventually discharged on DOAC.  Somewhere along the line he had been on Xarelto  for another reason presumably related to prior stroke.  In the hospital he developed ARDS from aspiration pneumonia.,  He was treated for C. difficile colitis, started on DOAC for  splenic vein/SMV thrombosis.  He was diuresed for chronic diastolic heart failure then transition to oral Lasix  and spironolactone  added.  He was discharged on a lower dose and is prehospital carvedilol -actually he had Coreg  CR listed at 80 mg and was discharged on 12.5 mg witnessed twice daily which he is only been taking once daily.  Cardiology was not consulted but they recommend that he follows up with cardiology to reestablish despite not actively having a cardiac issue.  He had been on Plavix  presumably for his stroke that was discontinued.  Subjective  Discussed the use of AI scribe software for clinical note transcription with the patient, who gave verbal consent to proceed.  History of Present Illness  [name] is a 68 year old male who presents for a cardiology evaluation. He was referred by [name] following a recent checkup.  No current cardiac symptoms such as chest pain, pressure, tightness, shortness of breath, or palpitations. No episodes of syncope or dizziness.  Hospitalized from June 23rd to July 10th, 2025, for necrotizing pancreatitis, during which he developed aspiration pneumonia and Clostridioides difficile infection. Treated with antibiotics post-discharge. Currently on insulin  therapy, administered once daily, and unsure about its continuation.  Currently taking carvedilol , switched from long-acting to short-acting form due to cost, taken twice daily. Also takes Eliquis  5 mg twice daily, amlodipine  10 mg once daily, atorvastatin  once daily, and spironolactone  once daily. Previously on metformin  but no longer taking it.  History of a stroke approximately three to four years ago, previously on Xarelto   and Plavix . Underwent heart catheterization in 2013 following an abnormal stress test.  History of smoking, currently smoking three to four cigarettes a day, attempting to quit with patches from the [name]. Stopped consuming alcohol after experiencing withdrawal  symptoms.  Experiences some swelling in feet, improved but remains slightly puffy. No shortness of breath when lying down, chest tightness, or palpitations. Sleeps in a recliner due to back pain from his bed.  Underwent multiple surgeries, including right shoulder and bilateral hip surgeries. No current pain in calves or thighs when walking.  Cardiovascular ROS: positive for - dyspnea on exertion, edema, and borderline orthopnea but not really. negative for - chest pain, dyspnea on exertion, irregular heartbeat, loss of consciousness, palpitations, paroxysmal nocturnal dyspnea, rapid heart rate, shortness of breath, or syncope/near syncope no TIA/amaurosis fugax/claudication  ROS:  Review of Systems - Negative except symptoms noted/discussed above    Objective   Medications - Lantus  - Carvedilol  12.5 mg which she is only taking once a day - Eliquis  5 mg twice a day - Amlodipine  10 mg once a day - Atorvastatin  once a day - Metformin  once a day - Spironolactone  once a day in the morning - Furosemide  40 mg daily  Social History - Tobacco: Current smoker, 0.2 ppd. Patient smokes 3-4 cigarettes per day and is attempting to quit using patches. - Alcohol: Patient has abstained from alcohol use after experiencing withdrawal. - Living Situation: Lives in a house, sleeps in a recliner due to back pain from bed.   Studies Reviewed: [name]       Lab Results  Component Value Date   CHOL 117 10/28/2023   HDL 50 10/28/2023   LDLCALC 49 10/28/2023   LDLDIRECT 96.5 05/14/2021   TRIG 95 10/28/2023   CHOLHDL 2.3 10/28/2023   Lab Results  Component Value Date   NA 137 05/17/2024   K 3.4 (L) 05/17/2024   CREATININE 0.71 (L) 05/17/2024   EGFR 101 05/17/2024   GLUCOSE 151 (H) 05/17/2024   Lab Results  Component Value Date   HGBA1C 6.1 (H) 04/02/2024    ECHO: LVEF 60 to 65%.  Normal LV size and function.  Mild LVH.  GR 1 DD.  Normal RV size and function.  Likely Coumadin ridge in the left  atrium.  Normal mitral valve.  Normal aortic valve.  Normal IVC.  Negative bubble study.  (04/2021)  Cardiac Cath 07/2012: Normal Coronaries (small - nondominant RCA) LCx with tiny OM1& OM2,& 3LPLs. LVEDP 10 mmHg.   Risk Assessment/Calculations:             Physical Exam:   VS:  BP 138/62   Pulse 60   Ht 5' 7 (1.702 m)   Wt 147 lb 4.8 oz (66.8 kg)   SpO2 97%   BMI 23.07 kg/m    Wt Readings from Last 3 Encounters:  06/14/24 147 lb 4.8 oz (66.8 kg)  06/08/24 146 lb 3.2 oz (66.3 kg)  05/25/24 150 lb 8 oz (68.3 kg)    GEN: Well nourished, well developed; mildly ill-appearing, but in no acute distress. NECK: No JVD; No carotid bruits CARDIAC: Normal S1, S2; RRR, no murmurs, rubs, gallops RESPIRATORY:  Clear to auscultation without rales, wheezing or rhonchi ; nonlabored, good air movement. ABDOMEN: Soft, non-tender, non-distended EXTREMITIES:  No edema; No deformity     ASSESSMENT AND PLAN: .    Problem List Items Addressed This Visit       Cardiology Problems   CVA (cerebral vascular accident) (HCC) (  Chronic)   Essential (primary) hypertension (Chronic)   Blood pressure little elevated today, but he has been taking his carvedilol  once daily-had previously.  Written for carvedilol  CR which he could not afford but that was once daily.  Had previously been on chlorthalidone, because of congestive arthropathy noted in the hospital, he was placed on spironolactone  and furosemide  instead. - Increase carvedilol  to 12.5 mg twice daily and continue amlodipine  10 mg daily. - Continue spironolactone  25 mg daily -Seems euvolemic but reasonable to continue on furosemide  40 mg daily for now.      Hyperlipidemia due to type 2 diabetes mellitus (HCC) (Chronic)   Lipids were last checked in December 2024 with LDL of 49. He currently is on atorvastatin  40 mg daily.  This is well within goal. - Continue atorvastatin  40 mg daily. - Labs checked by PCP.        Other   Alcohol abuse  (Chronic)   Alcohol-related pancreatitis.  Stressed the importance of complete abstinence.  He has been through the withdrawal period and hopes to continue to stay abstinent.      Diastolic dysfunction without heart failure - Primary (Chronic)   Echocardiogram reveals grade 1 diastolic function with mild LVH not unexpected for his age with hypertension.  He had some volume overload issues in the hospital after he has been treated for sepsis with necrotizing pancreatitis.  He probably got excess fluids and required diuresis.  Currently asymptomatic now with no PND orthopnea and edema seems very well-controlled.  He is on oral furosemide  40 mg daily along with spironolactone  25 mg daily in conjunction with his amlodipine  10 mg daily and carvedilol  which I am increasing to 12.5 mg twice daily.  - For now continue current medications but low threshold to actually make the Lasix  PRN once his edema seems to be fully resolved. - With heart rate in the 60s, no real room to titrate carvedilol  to much further than 12.5 mg twice daily.  Next medication to reinitiate would be an ARB.      Tobacco abuse (Chronic)   Smoking 3-4 cigarettes daily. Awaiting nicotine  patches from [name]. - Encourage smoking cessation. - [facility].      Type 2 diabetes mellitus with complication, with long-term current use of insulin  (HCC) (Chronic)   A1c as of April 02, 2024 was 6.1.  He is currently on Lantus  after leaving the hospital, no longer on Farxiga  or metformin .  Defer to PCP but appears to be pretty well-controlled.               Follow-Up: Return in about 1 year (around 06/14/2025) for Routine follow up with me, [employer].  Total time spent: 21 min spent with patient + 25 min spent charting = 46 min    Signed, Brian MICAEL Clay, MD, MS [name], M.D., M.S. Interventional Chartered certified accountant  Pager # [phone]

## 2024-06-14 NOTE — Assessment & Plan Note (Signed)
 Lipids were last checked in December 2024 with LDL of 49. He currently is on atorvastatin  40 mg daily.  This is well within goal. - Continue atorvastatin  40 mg daily. - Labs checked by PCP.

## 2024-06-14 NOTE — Assessment & Plan Note (Signed)
 Smoking 3-4 cigarettes daily. Awaiting nicotine  patches from [place]. - Encourage smoking cessation. - [facility].

## 2024-06-16 ENCOUNTER — Other Ambulatory Visit: Payer: Self-pay | Admitting: Nurse Practitioner

## 2024-06-16 MED ORDER — CARVEDILOL 12.5 MG PO TABS: 12.5000 mg | ORAL_TABLET | Freq: Two times a day (BID) | ORAL | 9 refills | Status: AC

## 2024-06-16 NOTE — Telephone Encounter (Signed)
 Copied from CRM [id number]. Topic: Clinical - Medication Refill >> Jun 16, 2024  2:02 PM Carla L wrote: Medication: carvedilol  (COREG ) 12.5 MG tablet  Has the patient contacted their pharmacy? Yes Told to contact the office.   This is the patient's preferred pharmacy:  [facility] [phone] [address], [place] - [address] [place] [address] Phone: [phone] Fax: [phone]  Is this the correct pharmacy for this prescription? Yes  Has the prescription been filled recently? No  Is the patient out of the medication? Yes  Has the patient been seen for an appointment in the last year OR does the patient have an upcoming appointment? Yes  Can we respond through MyChart? No  Agent: Please be advised that Rx refills may take up to 3 business days. We ask that you follow-up with your pharmacy.

## 2024-06-21 ENCOUNTER — Encounter (HOSPITAL_COMMUNITY): Payer: Self-pay

## 2024-06-21 ENCOUNTER — Ambulatory Visit (HOSPITAL_BASED_OUTPATIENT_CLINIC_OR_DEPARTMENT_OTHER): Payer: Self-pay

## 2024-06-21 VITALS — BP 110/56 | HR 58 | Ht 67.0 in | Wt 146.8 lb

## 2024-06-21 DIAGNOSIS — F4310 Post-traumatic stress disorder, unspecified: Secondary | ICD-10-CM | POA: Diagnosis not present

## 2024-06-21 DIAGNOSIS — G4700 Insomnia, unspecified: Secondary | ICD-10-CM

## 2024-06-21 DIAGNOSIS — F411 Generalized anxiety disorder: Secondary | ICD-10-CM

## 2024-06-21 MED ORDER — HYDROXYZINE HCL 25 MG PO TABS
25.0000 mg | ORAL_TABLET | Freq: Every day | ORAL | 0 refills | Status: AC | PRN
Start: 2024-06-21 — End: ?

## 2024-06-21 MED ORDER — MELATONIN 5 MG PO TABS: 5.0000 mg | ORAL_TABLET | Freq: Every day | ORAL | 0 refills | Status: AC

## 2024-06-21 NOTE — Addendum Note (Signed)
 Addended by: CARVIN CROCK on: 06/21/2024 02:58 PM   Modules accepted: Level of Service

## 2024-06-28 ENCOUNTER — Other Ambulatory Visit: Payer: Self-pay | Admitting: Nurse Practitioner

## 2024-06-29 ENCOUNTER — Other Ambulatory Visit (HOSPITAL_COMMUNITY): Payer: Self-pay

## 2024-06-29 ENCOUNTER — Ambulatory Visit (INDEPENDENT_AMBULATORY_CARE_PROVIDER_SITE_OTHER): Payer: Self-pay

## 2024-06-29 VITALS — BP 121/52 | HR 52

## 2024-06-29 DIAGNOSIS — Z794 Long term (current) use of insulin: Secondary | ICD-10-CM | POA: Diagnosis not present

## 2024-06-29 DIAGNOSIS — I10 Essential (primary) hypertension: Secondary | ICD-10-CM

## 2024-06-29 DIAGNOSIS — E118 Type 2 diabetes mellitus with unspecified complications: Secondary | ICD-10-CM

## 2024-06-29 MED ORDER — PEN NEEDLES 32G X 4 MM MISC: 3 refills | Status: AC

## 2024-06-29 MED ORDER — INSULIN GLARGINE 100 UNIT/ML SOLOSTAR PEN: 35.0000 [IU] | PEN_INJECTOR | Freq: Every day | SUBCUTANEOUS | 3 refills | Status: DC

## 2024-06-29 MED ORDER — FREESTYLE LIBRE 3 PLUS SENSOR MISC: 11 refills | Status: DC

## 2024-06-29 MED ORDER — FREESTYLE LIBRE 3 READER DEVI: 0 refills | Status: AC

## 2024-06-29 MED ORDER — AMLODIPINE BESYLATE 5 MG PO TABS: 5.0000 mg | ORAL_TABLET | Freq: Every day | ORAL | 3 refills | Status: AC

## 2024-06-29 NOTE — Patient Instructions (Addendum)
 It was nice to see you today!  Your goal blood sugar is 80-130 before eating and less than 180 after eating. Your goal blood pressure is less than 130/80 mmHg.  Medication Changes: Continue insulin  glargine (Lantus ) 35 units daily. Instead of the vial, you can start using the insulin  pen and pen needles from the pharmacy.   Restart metformin  500 mg once daily  Decrease amlodipine  to 5 mg daily  Bring the [employer] continuous glucose monitor supplies to your next appointment. Try to download the [employer] app on your phone if you are able to .   Continue all other medication the same. The list at the end of this packet is an updated list of your medications.   Monitor blood sugars at home and keep a log (glucometer or piece of paper) to bring with you to your next visit.  Keep up the good work with diet and exercise. Aim for a diet full of vegetables, fruit and lean meats (chicken, [place], fish). Try to limit salt intake by eating fresh or frozen vegetables (instead of canned), rinse canned vegetables prior to cooking and do not add any additional salt to meals.

## 2024-06-29 NOTE — Progress Notes (Signed)
 06/29/2024 Name: [name] MRN: [id number] DOB: 05-18-1956  Chief Complaint  Patient presents with   Medication Access   Diabetes   Hyperlipidemia   Hypertension    [name] is a 68 y.o. year old male who was referred for medication management by their primary care provider, [name], NP. They presented for a face to face visit today.   They were referred to the pharmacist by their PCP for assistance in managing diabetes . 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   Subjective: Patient was last seen by PCP, [name], NP, on 06/08/24 for hospital follow-up after the patient visited the ED for an ear infection. He was previously seen for a routine visit by his PCP on 05/17/24, at which point he reported difficulty obtaining medications. BP has been controlled. Patient was last engaged by pharmacy via telephone on 05/20/24. He reported he was able to pick up his medications from the pharmacy and had been taking his insulin . He was not at home to review his medication bottles with me. He was provided with an Rx for Accu Chek meter to begin monitoring his BG. He was advised to obtain nicotine  replacement therapy from the [facility]. He was seen by Dr. Anner (cardiology) on 06/14/24. He was advised to take carvedilol  twice daily as prescribed and continue all other medications. He reported that he has remained abstinent from alcohol. Advised that if BP remained uncontrolled, would consider initiating and ARB. Patient reported awaiting a shipment of nicotine  patches from the [facility].  Today, patient presents in  good spirits and presents without  any assistance. Patient reports that he is doing well, but states he has been out of amlodipine  for 2 days. He also was not able to fill folic acid  at the pharmacy because it was too expensive. He did receive nicotine  patches in the mail from the [facility]. He has  not had any issues with swelling in his feet/ankles since he has been home from the hospital. He is taking insulin  as prescribed, but reports it is difficult for him to use the vial and occasionally he has issues injecting (gets a bubble under the skin). He is interested in switching from the vial to the pen formulation of insulin .    Care Team: Primary Care Provider: [name], NP ; Next Scheduled Visit: 09/09/24  Medication Access/Adherence  Current Pharmacy:  [id number], [place] - [address] [place] [address] Phone: [phone] Fax: [phone]   Patient reports affordability concerns with their medications: No  Patient reports access/transportation concerns to their pharmacy: No  Patient reports adherence concerns with their medications:  Yes  - he still reports some confusion about his medication regimen since leaving the hospital. Denies missed doses of insulin . He has been out of amlodipine  for 2 days. He is concerned about whether atorvastatin  and Eliquis  are together with the pills he has been taking daily. Reports he has been taking carvedilol  twice daily since his cardiology appt.  Diabetes:  Current medications: insulin  glargine (Lantus  vial) 35 units daily, metformin  IR 500 mg daily (unsure whether he is currently taking) Medications tried in the past: N/A  Current glucose readings: Does not have meter with him to review today.  Using Accu Chek meter; testing a couple times per week This AM: 325 mg/dL, then had fruit and OJ (Sunny D lite) rechecked  and it was about the same Recalls another BG of 172 mg/dL  Reports he did have one episode of hypoglycemia when he got out of the nursing home. He was driving and felt very sweaty. Does not remember how he got from point A to point B. He was confused and the ambulance came and told him that his sugar had dropped. Denies episodes of hypoglycemia since then.   Patient  denies hyperglycemic symptoms including polyuria, polydipsia, polyphagia, nocturia, neuropathy, blurred vision.  Current meal patterns: Eating ~ 2 meals/day. Eats snacks throughout the day. Reports he does not eat a lot of meat.  - Breakfast: did not discuss today  - Supper: a little bit of rice, vegetables (pinto beans, green beans), corn, sweat peas. Has spaghetti, lasagna.  - Snacks: PB nabs, graham crackers. Piece of fruit (tangerine, grapes). Cut out potato chips.  - Drinks: he does have some sugary drinks, tries to pick lite or diet options.   Current physical activity: staying active outside around the house.   Current medication access support: Dual Complete  Hypertension:  Current medications: amlodipine  10 mg daily, carvedilol  12.5 mg BID, spironolactone  25 mg daily, furosemide  40 mg daily Medications previously tried:   Patient does not have a validated, automated, upper arm home BP cuff Current blood pressure readings readings: 130/60 at home today (PT nurse took this at home).   Patient denies hypotensive s/sx including dizziness, lightheadedness.  Patient denies hypertensive symptoms including headache, chest pain, shortness of breath   Hyperlipidemia/ASCVD Risk Reduction  Current lipid lowering medications: atorvastatin  40 mg daily Medications tried in the past: N/A  Antiplatelet regimen: Eliquis  5 mg BID  ASCVD History: CVA Risk Factors: HTN, T2DM, tobacco use  Clinical ASCVD: Yes  The ASCVD Risk score (Arnett DK, et al., 2019) failed to calculate for the following reasons:   Risk score cannot be calculated because patient has a medical history suggesting prior/existing ASCVD   Tobacco Abuse:  Tobacco Use History: Number of cigarettes per day: 4-5 per day   Quit Attempt History: Current medications: nicotine  patch (7 mg) - just started wearing today around 12 PM Most recent quit attempt: current Originally used cost of cigarettes as a motivator to quit.  Began to buy single cigarettes instead of packs. Reports that the location where he could buy singles is farther away from him, so this also makes it more difficult for him to obtain cigarettes.    Objective:  BP Readings from Last 3 Encounters:  06/29/24 (!) 121/52  06/21/24 (!) 110/56  06/14/24 138/62    Lab Results  Component Value Date   HGBA1C 6.1 (H) 04/02/2024   HGBA1C 6.3 (A) 04/02/2024   HGBA1C 5.7 (A) 10/28/2023       Latest Ref Rng & Units 05/17/2024   12:03 PM 05/05/2024    3:59 AM 05/04/2024    2:36 PM  BMP  Glucose 70 - 99 mg/dL 848  864  827   BUN 8 - 27 mg/dL 3  14  11    Creatinine 0.76 - 1.27 mg/dL 9.28  9.35  9.41   BUN/Creat Ratio 10 - 24 4     Sodium 134 - 144 mmol/L 137  133  131   Potassium 3.5 - 5.2 mmol/L 3.4  4.0  4.2   Chloride 96 - 106 mmol/L 101  98  96   CO2 20 - 29 mmol/L 21  22  23    Calcium  8.6 - 10.2 mg/dL 9.3  9.5  9.1  Lab Results  Component Value Date   CHOL 117 10/28/2023   HDL 50 10/28/2023   LDLCALC 49 10/28/2023   LDLDIRECT 96.5 05/14/2021   TRIG 95 10/28/2023   CHOLHDL 2.3 10/28/2023    Medications Reviewed Today     Reviewed by Brinda Lorain SQUIBB, RPH (Pharmacist) on 06/29/24 at 1452  Med List Status: <None>   Medication Order Taking? Sig Documenting Provider Last Dose Status Informant  Accu-Chek Softclix Lancets lancets [id number]  Use as instructed to monitor blood glucose twice daily Nichols, Tonya S, NP  Active   amLODipine  (NORVASC ) 5 MG tablet [id number]  Take 1 tablet (5 mg total) by mouth daily. [name], NP  Active   atorvastatin  (LIPITOR) 40 MG tablet [id number] Yes Take 40 mg by mouth at bedtime. [provider]  Active   Blood Glucose Monitoring Suppl (ACCU-CHEK GUIDE ME) w/Device KIT [id number]  Use as directed to monitor blood glucose [name], NP  Active   carvedilol  (COREG ) 12.5 MG tablet [id number] Yes Take 1 tablet (12.5 mg total) by mouth 2 (two) times daily with a meal. [name], NP   Active   Continuous Glucose Receiver (FREESTYLE LIBRE 3 READER) DEVI [id number] Yes Use as directed with Freestyle Libre 3 sensor. [name], NP  Active   Continuous Glucose Sensor (FREESTYLE LIBRE 3 PLUS SENSOR) [place] [id number] Yes Change sensor every 15 days. [name], NP  Active   ELIQUIS  5 MG TABS tablet [id number] Yes TAKE 1 TABLET BY MOUTH TWICE A DAY Nichols, Tonya S, NP  Active   folic acid  (FOLVITE ) 1 MG tablet [id number] Yes TAKE 1 TABLET BY MOUTH EVERY DAY Nichols, Tonya S, NP  Active   furosemide  (LASIX ) 40 MG tablet [id number] Yes TAKE 1 TABLET BY MOUTH EVERY DAY Nichols, Tonya S, NP  Active   glucose blood (ACCU-CHEK GUIDE TEST) test strip [id number]  Use as instructed to monitor blood glucose twice daily Nichols, Tonya S, NP  Active   hydrOXYzine  (ATARAX ) 25 MG tablet [id number] Yes Take 1 tablet (25 mg total) by mouth daily as needed. Kapoor, Sahil, MD  Active   insulin  glargine (LANTUS ) 100 UNIT/ML Solostar Pen [id number] Yes Inject 35 Units into the skin daily. [name], NP  Active     Discontinued 06/29/24 1408 (Duplicate)   Insulin  Pen Needle (PEN NEEDLES) 32G X 4 MM MISC [id number] Yes Use to inject insulin  once daily Nichols, Tonya S, NP  Active     Discontinued 06/29/24 1437 (No longer needed (for PRN medications))    Discontinued 06/29/24 1429 (Change in therapy)   melatonin 5 MG TABS [id number]  Take 1 tablet (5 mg total) by mouth at bedtime. Kapoor, Sahil, MD  Active   metFORMIN  (GLUCOPHAGE ) 500 MG tablet [id number]  Take 500 mg by mouth daily.  Patient not taking: Reported on 06/29/2024   [provider]  Active   nicotine  (NICODERM CQ  - DOSED IN MG/24 HOURS) 14 mg/24hr patch [id number] Yes Place 1 patch (14 mg total) onto the skin daily. Change every 24 hours. [name], NP  Active   ofloxacin  (FLOXIN ) 0.3 % OTIC solution [id number]  Place 5 drops into the left ear 2 (two) times daily. [name], NP  Active     Discontinued 06/29/24 1412  (Completed Course)   oxyCODONE -acetaminophen  (PERCOCET) 10-325 MG tablet [id number] Yes Take 1 tablet by mouth 5 (five) times daily. [provider]  Active   spironolactone  (ALDACTONE ) 25 MG  tablet [id number] Yes TAKE 1 TABLET (25 MG TOTAL) BY MOUTH DAILY. [name], NP  Active   tamsulosin  (FLOMAX ) 0.4 MG CAPS capsule [id number] Yes Take 2 capsules (0.8 mg total) by mouth daily. [name], NP  Active Self, Pharmacy Records           Med Note STEFFI, [place]   Tue Apr 20, 2024  9:45 AM) Recent Dispenses 04/10/2024 0.4 MG CAPS (disp 30, 30d supply) 01/14/2024 0.4 MG CAPS (disp 90, 90d supply)     traZODone  (DESYREL ) 100 MG tablet [id number] Yes Take 100 mg by mouth at bedtime as needed. [provider]  Active   Med List Note STEFFI Nian, CPhT 04/20/24 9045): Multiple social barriers impact patients compliance/accurate dispense history. Sometimes goes to [facility].               Assessment/Plan:   Diabetes: - Currently controlled with most recent A1C of 6.1% below goal <7%. 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 Because patient does report one episode of level 2 hypoglycemia, will continue current regimen for now and attempt to initiate a CGM sensor at follow-up. Will also transition patient from insulin  vial to insulin  pen for improved ease of administration. Patient was extensively educated on diet and lifestyle changes that promote glycemic control. Once glycemic control is more stable, he may benefit from an SGTL2i for given kidney protection given microalbuminuria. He is not a candidate for GLP-1RA given recent pancreatitis. - Last UACR 05/17/24: 111 mg/g - Reviewed long term cardiovascular and renal outcomes of uncontrolled blood sugar - Reviewed goal A1c, goal fasting, and goal 2 hour post prandial glucose - Reviewed  hypoglycemia management plan and the rule of 15 - Reviewed dietary modifications including  utilizing the healthy plate method, limiting portion size of carbohydrate foods, increasing intake of protein and non-starchy vegetables. Counseled patient to stay hydrated with water throughout the day. Provided with handout today. - Reviewed lifestyle modifications including: aiming for 150 minutes of moderate intensity exercise every week.  - Recommend to continue insulin  glargine (Lantus ) 35 units daily. Educated patient on switching from insulin  vial to insulin  pen. Collaborated with PCP to send new Rx to the pharmacy. Called pharmacy to ensure it was processed through insurance.  - Recommend to continue metformin  IR 500 mg daily. Consider titrating up at follow-up if BG remain uncontrolled. - Recommend to check glucose twice daily: fasting and 2-hr PPG. Counseled patient to bring glucometer or BG log to every appointment. Patient is a good candidate for CGM - collaborated with PCP to send Rx for FL3+ CGM to pharmacy. Patient to ask his daughter for assistance downloading the [place] app.  - Next A1C due 10/02/24 - will consider checking at next pharmacy visit in Oct 2025 given SMBG above goal.      Hypertension: - Currently controlled with clinic BP consistently below goal less than 130/80. Patient is not having s/sx of hypo- or hyper-tension. Medication adherence appears appropriate. Will decrease amlodipine  to 5 mg daily given recent soft BP. At follow-up, if patient is still euvolemic, consider transitioning furosemide  to 40 mg PRN.  - Reviewed long term cardiovascular and renal outcomes of uncontrolled blood pressure - Reviewed appropriate blood pressure monitoring technique and reviewed goal blood pressure. Educated patient how to obtain BP cuff (request from [facility] vs using [employer] OTC card). - Recommend to DECREASE amlodipine  to 5  mg daily - Recommend to continue carvedilol  12.5 mg BID, spironolactone  25 mg  daily, furosemide  40 mg daily    Hyperlipidemia/ASCVD Risk Reduction: - Currently controlled with most recent LDL-C of 49 mg/dL below goal < 70 mg/dL given U7IF and hx of CVA. High intensity statin indicated. Appropriate to continue current regimen.  - Recommend to continue atorvastatin  40 mg daily - Recommend to repeat lipid panel at PCP appt in Nov 2025    Written patient instructions provided. Patient verbalized understanding of treatment plan.   Follow Up Plan:  Pharmacist in-person 08/04/24 PCP clinic visit 09/09/24   Lorain Baseman, PharmD [facility] [phone]

## 2024-07-02 ENCOUNTER — Ambulatory Visit
Admission: EM | Admit: 2024-07-02 | Discharge: 2024-07-02 | Disposition: A | Discharge status: Home / Self Care | Attending: Family Medicine | Admitting: Family Medicine

## 2024-07-02 ENCOUNTER — Ambulatory Visit

## 2024-07-02 ENCOUNTER — Encounter: Payer: Self-pay | Admitting: Emergency Medicine

## 2024-07-02 DIAGNOSIS — M25511 Pain in right shoulder: Secondary | ICD-10-CM

## 2024-07-02 NOTE — ED Provider Notes (Signed)
 [facility]    CSN: [id number] Arrival date & time: 07/02/24  1525      History   Chief Complaint Chief Complaint  Patient presents with   Shoulder Pain    HPI [name] is a 68 y.o. male.    Shoulder Pain For right shoulder pain.  When he had open the car door to get out today from the passenger side of the car all of a sudden his right shoulder started hurting and it felt like it went out of place.  He has had some rotator cuff problems in the past  He is allergic to NSAIDs due to stomach irritation, lisinopril , citalopram, morphine , and Paxil.  Of note he is on Eliquis  for history of mesenteric thromosis  Sugars are doing overall pretty well, though this morning was 209.  On review of the PMP, he feels 150 oxycodone /APAP 10 mg every month, taking an average of 5 daily.  Past Medical History:  Diagnosis Date   Alcohol abuse    Allergic rhinitis    CHF (congestive heart failure) (HCC)    Diabetes mellitus without complication (HCC)    'sometime last yr'--type 2   GERD (gastroesophageal reflux disease)    Hepatitis    he thinks its hep b or c   Hyperlipidemia    Hypertension    Osteoarthritis    Pancreatitis    Rheumatoid arthritis(714.0)    Stroke (HCC)    Tobacco user     Patient Active Problem List   Diagnosis Date Noted   Hyperlipidemia due to type 2 diabetes mellitus (HCC) 06/14/2024   Diastolic dysfunction without heart failure 06/14/2024   Abnormal result of cardiovascular function study 05/25/2024   Depression 05/25/2024   Adenomatous polyp of colon 05/25/2024   Arthralgia 05/25/2024   Avascular necrosis of bone of hip (HCC) 05/25/2024   Barrett's esophagus without dysplasia 05/25/2024   Cerebral infarction (HCC) 05/25/2024   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, abuse (HCC) 05/25/2024   Complete tear of rotator cuff 05/25/2024   Diaphragmatic hernia 05/25/2024   Gastro-esophageal reflux disease with  esophagitis, without bleeding 05/25/2024   Generalized anxiety disorder 05/25/2024   Hepatitis C virus infection 05/25/2024   History of colonic polyps 05/25/2024   Migraine headache 05/25/2024   Impotence of organic origin 05/25/2024   Moderate episode of recurrent major depressive disorder (HCC) 05/25/2024   Other specified health status 05/25/2024   Open angle with borderline findings, low risk, bilateral 05/25/2024   Plantar wart 05/25/2024   Pain in left hip 05/25/2024   Hip pain 05/25/2024   Low back pain 05/25/2024   Essential (primary) hypertension 05/25/2024   Encounter for screening for malignant neoplasm of colon 05/25/2024   Other specified counseling 05/25/2024   Encounter for screening for malignant neoplasm of respiratory organs 05/25/2024   Hyperlipidemia 05/25/2024   Pain in right shoulder 05/25/2024   Tobacco user 05/25/2024   Hemoptysis 04/25/2024   Acute hypoxemic respiratory failure (HCC) 04/25/2024   Malnutrition of moderate degree 04/21/2024   Pancreatitis, alcoholic, acute 04/19/2024   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/2023   Preop examination 10/28/2023   Osteoarthritis of left hip 10/28/2023   Hyperlipidemia LDL goal <55 10/28/2023   Alcoholic intoxication without complication (HCC) 05/16/2021   Marijuana use 05/16/2021   CVA (cerebral vascular accident) (HCC) 05/14/2021   Alcohol abuse 05/14/2021   Tobacco abuse 05/14/2021   Type 2 diabetes mellitus with complication, with long-term current use of insulin  (  HCC) 05/14/2021   Angioedema 02/09/2017   Primary localized osteoarthritis of right hip 01/02/2016   Groin rash 06/16/2013   BPH (benign prostatic hyperplasia) 11/24/2012   Healthcare maintenance 11/24/2012   Pancreatitis 10/31/2012   Chronic pain syndrome 09/18/2011   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/19/2011   Right rotator cuff tear 07/26/2011   Shoulder pain, right 05/03/2011   Right ankle pain 01/23/2011   Left wrist pain 01/23/2011   Back pain  01/02/2011   MOLLUSCUM CONTAGIOSUM 12/05/2008   LEUKOCYTOSIS UNSPECIFIED 03/25/2008   LIVER FUNCTION TESTS, ABNORMAL 03/25/2008   ERECTILE DYSFUNCTION 05/12/2007   Hypercholesteremia 01/08/2007   ANXIETY 01/08/2007   GERD 01/08/2007   Rheumatoid arthritis (HCC) 01/08/2007   OSTEOARTHRITIS 01/08/2007   Cannabis abuse 04/09/1993   Cardiomegaly 04/09/1993   Cocaine dependence in remission (HCC) 04/09/1993   Alcohol dependence, continuous (HCC) 08/11/1992    Past Surgical History:  Procedure Laterality Date   COLONOSCOPY     FRACTURE SURGERY     cheek bone fracture    rtc     right shoulder  -- 2010   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   Procedure: TOTAL HIP ARTHROPLASTY ANTERIOR APPROACH;  Surgeon: [name], MD;  Location: [facility] OR;  Service: Orthopedics;  Laterality: Right;   TOTAL HIP ARTHROPLASTY Left 12/02/2023   Procedure: TOTAL HIP ARTHROPLASTY ANTERIOR APPROACH;  Surgeon: Chancy Evalene JONETTA, MD;  Location: [facility];  Service: Orthopedics;  Laterality: Left;   WRIST SURGERY     fusion with pins  2007       Home Medications    Prior to Admission medications   Medication Sig Start Date End Date Taking? Authorizing Provider  Accu-Chek Softclix Lancets lancets Use as instructed to monitor blood glucose twice daily 05/20/24   Nichols, Tonya S, NP  amLODipine  (NORVASC ) 5 MG tablet Take 1 tablet (5 mg total) by mouth daily. 06/29/24   [name], NP  atorvastatin  (LIPITOR) 40 MG tablet Take 40 mg by mouth at bedtime. 05/18/24   [provider]  Blood Glucose Monitoring Suppl (ACCU-CHEK GUIDE ME) w/Device KIT Use as directed to monitor blood glucose 05/20/24   [name], NP  carvedilol  (COREG ) 12.5 MG tablet Take 1 tablet (12.5 mg total) by mouth 2 (two) times daily with a meal. 06/16/24 07/16/24  [name], NP  Continuous Glucose Receiver (FREESTYLE LIBRE 3 READER) DEVI Use as directed with Freestyle Libre 3 sensor. 06/29/24   [name], NP   Continuous Glucose Sensor (FREESTYLE LIBRE 3 PLUS SENSOR) MISC Change sensor every 15 days. 06/29/24   [name], NP  ELIQUIS  5 MG TABS tablet TAKE 1 TABLET BY MOUTH TWICE A DAY 06/14/24   Nichols, Tonya S, NP  folic acid  (FOLVITE ) 1 MG tablet TAKE 1 TABLET BY MOUTH EVERY DAY 06/08/24   Nichols, Tonya S, NP  furosemide  (LASIX ) 40 MG tablet TAKE 1 TABLET BY MOUTH EVERY DAY 06/08/24   Nichols, Tonya S, NP  glucose blood (ACCU-CHEK GUIDE TEST) test strip Use as instructed to monitor blood glucose twice daily 05/20/24   Nichols, Tonya S, NP  hydrOXYzine  (ATARAX ) 25 MG tablet Take 1 tablet (25 mg total) by mouth daily as needed. 06/21/24   Kapoor, Sahil, MD  insulin  glargine (LANTUS ) 100 UNIT/ML Solostar Pen Inject 35 Units into the skin daily. 06/29/24   [name], NP  Insulin  Pen Needle (PEN NEEDLES) 32G X 4 MM MISC Use to inject insulin  once daily 06/29/24   Nichols, Tonya  S, NP  melatonin 5 MG TABS Take 1 tablet (5 mg total) by mouth at bedtime. 06/21/24   Kapoor, Sahil, MD  metFORMIN  (GLUCOPHAGE ) 500 MG tablet Take 500 mg by mouth daily. Patient not taking: Reported on 06/29/2024 05/18/24   [provider]  nicotine  (NICODERM CQ  - DOSED IN MG/24 HOURS) 14 mg/24hr patch Place 1 patch (14 mg total) onto the skin daily. Change every 24 hours. 05/20/24   [name], NP  ofloxacin  (FLOXIN ) 0.3 % OTIC solution Place 5 drops into the left ear 2 (two) times daily. 06/08/24   [name], NP  oxyCODONE -acetaminophen  (PERCOCET) 10-325 MG tablet Take 1 tablet by mouth 5 (five) times daily. 05/18/24   [provider]  spironolactone  (ALDACTONE ) 25 MG tablet TAKE 1 TABLET (25 MG TOTAL) BY MOUTH DAILY. 06/08/24   [name], NP  tamsulosin  (FLOMAX ) 0.4 MG CAPS capsule Take 2 capsules (0.8 mg total) by mouth daily. 06/20/22   [name], NP  traZODone  (DESYREL ) 100 MG tablet Take 100 mg by mouth at bedtime as needed. 05/13/24   [provider]    Family  History Family History  Problem Relation Age of Onset   Kidney disease Brother    Angioedema Maternal Aunt        Tongue swelling   Heart attack Mother    Heart attack Father     Social History Social History   Tobacco Use   Smoking status: Every Day    Current packs/day: 0.40    Average packs/day: 0.4 packs/day for 25.0 years (10.0 ttl pk-yrs)    Types: Cigarettes    Passive exposure: Current   Smokeless tobacco: Never   Tobacco comments:    Trying to quit smoking.  Currently smoking 1 -2 cigarette per day and is doing OK with decrease.  Vaping Use   Vaping status: Never Used  Substance Use Topics   Alcohol use: Yes    Alcohol/week: 7.0 - 14.0 standard drinks of alcohol    Types: 7 - 14 Cans of beer per week    Comment: 4 beer/day per family   Drug use: Not Currently    Comment: hx of 20 years ago -cocaine  and marijuanan     Allergies   Lisinopril , Citalopram, Morphine , Nsaids, and Paroxetine   Review of Systems Review of Systems   Physical Exam Triage Vital Signs ED Triage Vitals  Encounter Vitals Group     BP 07/02/24 1534 121/68     Girls Systolic BP Percentile --      Girls Diastolic BP Percentile --      Boys Systolic BP Percentile --      Boys Diastolic BP Percentile --      Pulse Rate 07/02/24 1534 (!) 55     Resp 07/02/24 1534 19     Temp 07/02/24 1534 97.7 F (36.5 C)     Temp Source 07/02/24 1534 Oral     SpO2 07/02/24 1534 96 %     Weight --      Height --      Head Circumference --      Peak Flow --      Pain Score 07/02/24 1533 8     Pain Loc --      Pain Education --      Exclude from Growth Chart --    No data found.  Updated Vital Signs BP 121/68 (BP Location: Left Arm)   Pulse (!) 55  Temp 97.7 F (36.5 C) (Oral)   Resp 19   SpO2 96%   Visual Acuity Right Eye Distance:   Left Eye Distance:   Bilateral Distance:    Right Eye Near:   Left Eye Near:    Bilateral Near:     Physical Exam Vitals reviewed.   Constitutional:      General: He is not in acute distress.    Appearance: He is not ill-appearing, toxic-appearing or diaphoretic.  HENT:     Nose: Nose normal.     Mouth/Throat:     Mouth: Mucous membranes are moist.  Musculoskeletal:     Cervical back: Neck supple.     Comments: There is tenderness over the anterior right shoulder.  I cannot tell that there is any step-off or deformity.  Range of motion causes pain on abduction.  Pulses normal.  Lymphadenopathy:     Cervical: No cervical adenopathy.  Skin:    Coloration: Skin is not jaundiced or pale.  Neurological:     General: No focal deficit present.     Mental Status: He is alert and oriented to person, place, and time.  Psychiatric:        Behavior: Behavior normal.      UC Treatments / Results  Labs (all labs ordered are listed, but only abnormal results are displayed) Labs Reviewed - No data to display  EKG   Radiology DG Shoulder Right Result Date: 07/02/2024 CLINICAL DATA:  Shoulder pain EXAM: RIGHT SHOULDER - 2+ VIEW COMPARISON:  04/25/2024 FINDINGS: No fracture or malalignment. Advanced AC joint and glenohumeral degenerative change. High-riding humeral head consistent with rotator cuff disease. Right apex is clear. IMPRESSION: Advanced degenerative changes of the shoulder. Electronically Signed   By: Luke Bun M.D.   On: 07/02/2024 16:14    Procedures Procedures (including critical care time)  Medications Ordered in UC Medications - No data to display  Initial Impression / Assessment and Plan / UC Course  I have reviewed the triage vital signs and the nursing notes.  Pertinent labs & imaging results that were available during my care of the patient were reviewed by me and considered in my medical decision making (see chart for details).     X-rays are negative for fracture.  There are advanced degenerative changes.  He he already takes Percocet at home for pain.  Due to allergies and side effects,  we cannot provide him a prescription for an anti-inflammatory here nor can we give him an shot of Toradol . We discussed considering a steroid shot, but he is having trouble regulating his sugars, so we decided against that.  Sling is provided and he will ice his shoulder tonight.  He is given contact information for orthopedics  Final Clinical Impressions(s) / UC Diagnoses   Final diagnoses:  Acute pain of right shoulder     Discharge Instructions      X-ray shows a lot of arthritic changes on the shoulder bones.  Continue your home medications.  You can apply some IcyHot or Bengay to the area also.  Please call the orthopedics office for an evaluation     ED Prescriptions   None    I have reviewed the PDMP during this encounter.   Vonna Sharlet POUR, MD 07/02/24 [phone]

## 2024-07-02 NOTE — Discharge Instructions (Signed)
 X-ray shows a lot of arthritic changes on the shoulder bones.  Continue your home medications.  You can apply some IcyHot or Bengay to the area also.  Please call the orthopedics office for an evaluation

## 2024-07-02 NOTE — ED Triage Notes (Signed)
 Pt reports opened car door and was getting out of car when right shoulder felt like went out of place. Patient unable to raise without assistance of left arm.

## 2024-07-05 ENCOUNTER — Ambulatory Visit: Payer: Self-pay | Admitting: Nurse Practitioner
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
## 2024-07-14 ENCOUNTER — Other Ambulatory Visit: Payer: Self-pay

## 2024-07-14 ENCOUNTER — Ambulatory Visit (INDEPENDENT_AMBULATORY_CARE_PROVIDER_SITE_OTHER)

## 2024-07-14 VITALS — Ht 67.0 in | Wt 148.0 lb

## 2024-07-14 DIAGNOSIS — E118 Type 2 diabetes mellitus with unspecified complications: Secondary | ICD-10-CM

## 2024-07-14 DIAGNOSIS — Z0000 Encounter for general adult medical examination without abnormal findings: Secondary | ICD-10-CM

## 2024-07-14 MED ORDER — ATORVASTATIN CALCIUM 40 MG PO TABS: 40.0000 mg | ORAL_TABLET | Freq: Every day | ORAL | 1 refills | Status: AC

## 2024-07-14 MED ORDER — INSULIN GLARGINE 100 UNIT/ML SOLOSTAR PEN: 35.0000 [IU] | PEN_INJECTOR | Freq: Every day | SUBCUTANEOUS | 3 refills | Status: DC

## 2024-07-14 NOTE — Progress Notes (Signed)
 Subjective:   [name] is a 68 y.o. male who presents for Medicare Annual/Subsequent preventive examination.  Visit Complete: Virtual I connected with  [name] on 07/14/24 by a audio enabled telemedicine application and verified that I am speaking with the correct person using two identifiers.  Patient Location: Home  Provider Location: Office/Clinic  I discussed the limitations of evaluation and management by telemedicine. The patient expressed understanding and agreed to proceed.  Vital Signs: Because this visit was a virtual/telehealth visit, some criteria may be missing or patient reported. Any vitals not documented were not able to be obtained and vitals that have been documented are patient reported.  Patient Medicare AWV questionnaire was completed by the patient on 07/14/2024; I have confirmed that all information answered by patient is correct and no changes since this date.        Objective:    There were no vitals filed for this visit. There is no height or weight on file to calculate BMI.     04/19/2024    1:02 PM 03/15/2024    3:49 PM 12/02/2023    4:56 PM 11/19/2023    1:18 PM 07/10/2023    8:21 AM 05/14/2021   12:46 PM 03/02/2019   10:32 AM  Advanced Directives  Does Patient Have a Medical Advance Directive? No No No No No No No  Would patient like information on creating a medical advance directive? No - Patient declined  No - Patient declined  No - Patient declined No - Patient declined No - Guardian declined      Data saved with a previous flowsheet row definition    Current Medications (verified) Outpatient Encounter Medications as of 07/14/2024  Medication Sig   Accu-Chek Softclix Lancets lancets Use as instructed to monitor blood glucose twice daily   amLODipine  (NORVASC ) 5 MG tablet Take 1 tablet (5 mg total) by mouth daily.   atorvastatin  (LIPITOR) 40 MG tablet Take 40 mg by mouth at bedtime.   Blood Glucose Monitoring Suppl (ACCU-CHEK  GUIDE ME) w/Device KIT Use as directed to monitor blood glucose   carvedilol  (COREG ) 12.5 MG tablet Take 1 tablet (12.5 mg total) by mouth 2 (two) times daily with a meal.   Continuous Glucose Receiver (FREESTYLE LIBRE 3 READER) DEVI Use as directed with Freestyle Libre 3 sensor.   Continuous Glucose Sensor (FREESTYLE LIBRE 3 PLUS SENSOR) MISC Change sensor every 15 days.   ELIQUIS  5 MG TABS tablet TAKE 1 TABLET BY MOUTH TWICE A DAY   folic acid  (FOLVITE ) 1 MG tablet TAKE 1 TABLET BY MOUTH EVERY DAY   furosemide  (LASIX ) 40 MG tablet TAKE 1 TABLET BY MOUTH EVERY DAY   glucose blood (ACCU-CHEK GUIDE TEST) test strip Use as instructed to monitor blood glucose twice daily   hydrOXYzine  (ATARAX ) 25 MG tablet Take 1 tablet (25 mg total) by mouth daily as needed.   insulin  glargine (LANTUS ) 100 UNIT/ML Solostar Pen Inject 35 Units into the skin daily.   Insulin  Pen Needle (PEN NEEDLES) 32G X 4 MM MISC Use to inject insulin  once daily   melatonin 5 MG TABS Take 1 tablet (5 mg total) by mouth at bedtime.   metFORMIN  (GLUCOPHAGE ) 500 MG tablet Take 500 mg by mouth daily. (Patient not taking: Reported on 06/29/2024)   nicotine  (NICODERM CQ  - DOSED IN MG/24 HOURS) 14 mg/24hr patch Place 1 patch (14 mg total) onto the skin daily. Change every 24 hours.   ofloxacin  (FLOXIN ) 0.3 % OTIC  solution Place 5 drops into the left ear 2 (two) times daily.   oxyCODONE -acetaminophen  (PERCOCET) 10-325 MG tablet Take 1 tablet by mouth 5 (five) times daily.   spironolactone  (ALDACTONE ) 25 MG tablet TAKE 1 TABLET (25 MG TOTAL) BY MOUTH DAILY.   tamsulosin  (FLOMAX ) 0.4 MG CAPS capsule Take 2 capsules (0.8 mg total) by mouth daily.   traZODone  (DESYREL ) 100 MG tablet Take 100 mg by mouth at bedtime as needed.   No facility-administered encounter medications on file as of 07/14/2024.    Allergies (verified) Lisinopril , Citalopram, Morphine , Nsaids, and Paroxetine   History: Past Medical History:  Diagnosis Date   Alcohol  abuse    Allergic rhinitis    CHF (congestive heart failure) (HCC)    Diabetes mellitus without complication (HCC)    'sometime last yr'--type 2   GERD (gastroesophageal reflux disease)    Hepatitis    he thinks its hep b or c   Hyperlipidemia    Hypertension    Osteoarthritis    Pancreatitis    Rheumatoid arthritis(714.0)    Stroke (HCC)    Tobacco user    Past Surgical History:  Procedure Laterality Date   COLONOSCOPY     FRACTURE SURGERY     cheek bone fracture    [facility]     right shoulder  -- 2010   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   Procedure: TOTAL HIP ARTHROPLASTY ANTERIOR APPROACH;  Surgeon: [name], MD;  Location: [facility] OR;  Service: Orthopedics;  Laterality: Right;   TOTAL HIP ARTHROPLASTY Left 12/02/2023   Procedure: TOTAL HIP ARTHROPLASTY ANTERIOR APPROACH;  Surgeon: Chancy Evalene JONETTA, MD;  Location: [facility];  Service: Orthopedics;  Laterality: Left;   WRIST SURGERY     fusion with pins  2007   Family History  Problem Relation Age of Onset   Kidney disease Brother    Angioedema Maternal Aunt        Tongue swelling   Heart attack Mother    Heart attack Father    Social History   Socioeconomic History   Marital status: Single    Spouse name: Not on file   Number of children: Not on file   Years of education: Not on file   Highest education level: Not on file  Occupational History   Not on file  Tobacco Use   Smoking status: Every Day    Current packs/day: 0.40    Average packs/day: 0.4 packs/day for 25.0 years (10.0 ttl pk-yrs)    Types: Cigarettes    Passive exposure: Current   Smokeless tobacco: Never   Tobacco comments:    Trying to quit smoking.  Currently smoking 1 -2 cigarette per day and is doing OK with decrease.  Vaping Use   Vaping status: Never Used  Substance and Sexual Activity   Alcohol use: Yes    Alcohol/week: 7.0 - 14.0 standard drinks of alcohol    Types: 7 - 14 Cans of beer per week    Comment: 4 beer/day per family    Drug use: Not Currently    Comment: hx of 20 years ago -cocaine  and marijuanan   Sexual activity: Yes  Other Topics Concern   Not on file  Social History Narrative   [name]. Married- 1 male sexual partner.   Denies drug use but has hx of crack cocaine, alcohol and tobacco use.   Social Drivers of Corporate investment banker Strain: Low Risk  (07/10/2023)   Overall Financial Resource  Strain (CARDIA)    Difficulty of Paying Living Expenses: Not very hard  Food Insecurity: Patient Unable To Answer (04/20/2024)   Hunger Vital Sign    Worried About Running Out of Food in the Last Year: Patient unable to answer    Ran Out of Food in the Last Year: Patient unable to answer  Transportation Needs: Patient Unable To Answer (04/20/2024)   PRAPARE - Transportation    Lack of Transportation (Medical): Patient unable to answer    Lack of Transportation (Non-Medical): Patient unable to answer  Physical Activity: Sufficiently Active (07/10/2023)   Exercise Vital Sign    Days of Exercise per Week: 7 days    Minutes of Exercise per Session: 30 min  Stress: No Stress Concern Present (07/10/2023)   [employer] of Occupational Health - Occupational Stress Questionnaire    Feeling of Stress : Only a little  Social Connections: Patient Unable To Answer (04/20/2024)   Social Connection and Isolation Panel    Frequency of Communication with Friends and Family: Patient unable to answer    Frequency of Social Gatherings with Friends and Family: Patient unable to answer    Attends Religious Services: Patient unable to answer    Active Member of Clubs or Organizations: Patient unable to answer    Attends Banker Meetings: Patient unable to answer    Marital Status: Patient unable to answer    Tobacco Counseling Ready to quit: Not Answered Counseling given: Not Answered Tobacco comments: Trying to quit smoking.  Currently smoking 1 -2 cigarette per day and is doing OK with  decrease.   Clinical Intake:                        Activities of Daily Living    04/28/2024   11:06 AM 04/28/2024    7:22 AM  In your present state of health, do you have any difficulty performing the following activities:  Hearing?  0  Vision?  0  Difficulty concentrating or making decisions?  0  Doing errands, shopping? 0     Patient Care Team: [name], NP as PCP - General (Pulmonary Disease) Anner Alm ORN, MD as PCP - Cardiology (Cardiology)  Indicate any recent Medical Services you may have received from other than [facility] providers in the past year (date may be approximate).     Assessment:   This is a routine wellness examination for Terri.  Hearing/Vision screen No results found.   Goals Addressed   None    Depression Screen    06/21/2024    1:59 PM 04/02/2024   10:30 AM 10/28/2023    9:33 AM 07/10/2023    8:24 AM 12/05/2021   10:25 AM 07/05/2021   10:55 AM 09/28/2020    2:20 PM  PHQ 2/9 Scores  PHQ - 2 Score  0 0 2 0 0 0  PHQ- 9 Score  12  12     Exception Documentation       Medical reason     Information is confidential and restricted. Go to Review Flowsheets to unlock data.    Fall Risk    07/10/2023    8:21 AM 06/20/2022    8:29 AM 12/05/2021   10:25 AM 08/17/2021    8:54 AM 07/05/2021   10:55 AM  Fall Risk   Falls in the past year? 1 0 0 0 0  Number falls in past yr: 0 0  0   Injury with  Fall? 0 0  0 0  Risk for fall due to : Impaired balance/gait No Fall Risks     Follow up Education provided;Falls prevention discussed Falls evaluation completed         Data saved with a previous flowsheet row definition    MEDICARE RISK AT HOME:    TIMED UP AND GO:  Was the test performed?  NA   Cognitive Function:        07/10/2023    8:23 AM  6CIT Screen  What Year? 0 points  What month? 0 points  What time? 0 points  Count back from 20 0 points  Months in reverse 0 points  Repeat phrase 0 points  Total Score 0 points     Immunizations Immunization History  Administered Date(s) Administered   Dtap, Unspecified 07/24/1967   Fluad Quad(high Dose 65+) 12/13/2021, 10/11/2022   INFLUENZA, HIGH DOSE SEASONAL PF 07/23/2023   Influenza Whole 10/17/2010   Influenza, Seasonal, Injecte, Preservative Fre 10/06/2012, 07/27/2013, 11/23/2014   Influenza,inj,Quad PF,6+ Mos 09/09/2016, 10/01/2017, 11/13/2018   Influenza-Unspecified 10/04/2004, 08/28/2010, 09/25/2011, 10/28/2012, 10/11/2022   PFIZER(Purple Top)SARS-COV-2 Vaccination 05/17/2020, 06/08/2020   PNEUMOCOCCAL CONJUGATE-20 01/13/2023   Pneumococcal Polysaccharide-23 01/24/2004, 02/12/2017   Polio, Unspecified 07/24/1967   Respiratory Syncytial Virus Vaccine,Recomb Aduvanted(Arexvy) 10/11/2022   Td 10/17/2010   Td (Adult),5 Lf Tetanus Toxid, Preservative Free 12/13/2021   Tdap 05/19/2012, 08/01/2020    TDAP status: Up to date  Flu Vaccine status: Due, Education has been provided regarding the importance of this vaccine. Advised may receive this vaccine at local pharmacy or Health Dept. Aware to provide a copy of the vaccination record if obtained from local pharmacy or Health Dept. Verbalized acceptance and understanding.  Pneumococcal vaccine status: Up to date  Covid-19 vaccine status: Declined, Education has been provided regarding the importance of this vaccine but patient still declined. Advised may receive this vaccine at local pharmacy or Health Dept.or vaccine clinic. Aware to provide a copy of the vaccination record if obtained from local pharmacy or Health Dept. Verbalized acceptance and understanding.  Qualifies for Shingles Vaccine? Yes   Zostavax completed No   Shingrix Completed?: No.    Education has been provided regarding the importance of this vaccine. Patient has been advised to call insurance company to determine out of pocket expense if they have not yet received this vaccine. Advised may also receive vaccine at local pharmacy or Health  Dept. Verbalized acceptance and understanding.  Screening Tests Health Maintenance  Topic Date Due   OPHTHALMOLOGY EXAM  11/15/2021   FOOT EXAM  12/05/2022   COVID-19 Vaccine (3 - 2025-26 season) 06/28/2024   Medicare Annual Wellness (AWV)  07/09/2024   Zoster Vaccines- Shingrix (1 of 2) 08/17/2024 (Originally 11/26/2006)   Influenza Vaccine  01/25/2025 (Originally 05/28/2024)   HEMOGLOBIN A1C  10/02/2024   Diabetic kidney evaluation - eGFR measurement  05/17/2025   Diabetic kidney evaluation - Urine ACR  05/17/2025   DTaP/Tdap/Td (6 - Td or Tdap) 12/14/2031   Colonoscopy  01/02/2033   Pneumococcal Vaccine: 50+ Years  Completed   Hepatitis C Screening  Completed   HPV VACCINES  Aged Out   Meningococcal B Vaccine  Aged Out    Health Maintenance  Health Maintenance Due  Topic Date Due   OPHTHALMOLOGY EXAM  11/15/2021   FOOT EXAM  12/05/2022   COVID-19 Vaccine (3 - 2025-26 season) 06/28/2024   Medicare Annual Wellness (AWV)  07/09/2024    Colorectal cancer screening: Type of screening: Colonoscopy.  Completed 2024. Repeat every 10 years  Lung Cancer Screening: (Low Dose CT Chest recommended if Age 15-80 years, 20 pack-year currently smoking OR have quit w/in 15years.) does not qualify.   Lung Cancer Screening Referral:   Additional Screening:  Hepatitis C Screening: does qualify; Completed 2020  Vision Screening: Recommended annual ophthalmology exams for early detection of glaucoma and other disorders of the eye. Is the patient up to date with their annual eye exam?  No  Who is the provider or what is the name of the office in which the patient attends annual eye exams? [facility] If pt is not established with a provider, would they like to be referred to a provider to establish care? No .   Dental Screening: Recommended annual dental exams for proper oral hygiene  Diabetic Foot Exam: Diabetic Foot Exam: Overdue, Pt has been advised about the importance in completing this exam. Pt  is scheduled for diabetic foot exam on NA.  Community Resource Referral / Chronic Care Management: CRR required this visit?  No   CCM required this visit?  Appt scheduled with PCP     Plan:     I have personally reviewed and noted the following in the patient's chart:   Medical and social history Use of alcohol, tobacco or illicit drugs  Current medications and supplements including opioid prescriptions. Patient is currently taking opioid prescriptions. Information provided to patient regarding non-opioid alternatives. Patient advised to discuss non-opioid treatment plan with their provider. Functional ability and status Nutritional status Physical activity Advanced directives List of other physicians Hospitalizations, surgeries, and ER visits in previous 12 months Vitals Screenings to include cognitive, depression, and falls Referrals and appointments  In addition, I have reviewed and discussed with patient certain preventive protocols, quality metrics, and best practice recommendations. A written personalized care plan for preventive services as well as general preventive health recommendations were provided to patient.     Francine Hannan D Leslea Vowles, CMA   07/14/2024   After Visit Summary: (Mail) Due to this being a telephonic visit, the after visit summary with patients personalized plan was offered to patient via mail   Nurse Notes: Thanks for your time, have a great day-Sorin Frimpong, RMA

## 2024-07-19 ENCOUNTER — Ambulatory Visit (HOSPITAL_COMMUNITY)

## 2024-07-19 ENCOUNTER — Telehealth (HOSPITAL_COMMUNITY): Payer: Self-pay

## 2024-07-19 NOTE — Telephone Encounter (Signed)
 Patient had an appointment today at 10:30 AM.  Called the patient, patient stated that he forgot about the appointment, offered him a virtual visit however the patient declined.  Stated that he would call the clinic to have his appointment rescheduled.  Ryanne Morand

## 2024-07-20 ENCOUNTER — Other Ambulatory Visit: Payer: Self-pay | Admitting: Nurse Practitioner

## 2024-07-20 ENCOUNTER — Telehealth: Payer: Self-pay | Admitting: Nurse Practitioner

## 2024-07-20 DIAGNOSIS — Z794 Long term (current) use of insulin: Secondary | ICD-10-CM

## 2024-07-20 NOTE — Telephone Encounter (Unsigned)
 Copied from CRM [id number]. Topic: Clinical - Medication Refill >> Jul 20, 2024  3:58 PM Tiffini S wrote: Medication: insulin  glargine (LANTUS ) 100 UNIT/ML Solostar Pen,   tamsulosin  (FLOMAX ) 0.4 MG CAPS capsule    Has the patient contacted their pharmacy? Yes (Agent: If no, request that the patient contact the pharmacy for the refill. If patient does not wish to contact the pharmacy document the reason why and proceed with request.) (Agent: If yes, when and what did the pharmacy advise?)  This is the patient's preferred pharmacy:  [facility] [phone] [address], [place] - [address] [place] [address] Phone: [phone] Fax: [phone]  Is this the correct pharmacy for this prescription? Yes If no, delete pharmacy and type the correct one.   Has the prescription been filled recently? Yes  Is the patient out of the medication? Yes  Has the patient been seen for an appointment in the last year OR does the patient have an upcoming appointment? Yes  Can we respond through MyChart? No,please call patient at  [phone]  Agent: Please be advised that Rx refills may take up to 3 business days. We ask that you follow-up with your pharmacy.

## 2024-07-20 NOTE — Telephone Encounter (Unsigned)
 Copied from CRM #[id number]. Topic: Clinical - Medication Question >> Jul 20, 2024  4:04 PM Tiffini S wrote: Reason for CRM: Patient called with questions about atorvastatin  (LIPITOR) 40 MG tablet Please call the patient at [phone] - ask if he should take medication or not.

## 2024-07-21 MED ORDER — TAMSULOSIN HCL 0.4 MG PO CAPS: 0.8000 mg | ORAL_CAPSULE | Freq: Every day | ORAL | 1 refills | Status: AC

## 2024-07-21 MED ORDER — INSULIN GLARGINE 100 UNIT/ML SOLOSTAR PEN: 35.0000 [IU] | PEN_INJECTOR | Freq: Every day | SUBCUTANEOUS | 3 refills | Status: AC

## 2024-07-21 NOTE — Telephone Encounter (Signed)
 Pt has requested a refill on this medication he has gotten this since 05/2022 , did you want this patient to have a refill on this medication

## 2024-07-21 NOTE — Telephone Encounter (Signed)
 Please advise [facility]

## 2024-07-23 NOTE — Telephone Encounter (Signed)
 Contacted patient and confirmed prescription was sent on 07/14/2024 for his atorvastatin  and the lantus  prescription had been sent on 07/21/2024

## 2024-07-29 ENCOUNTER — Ambulatory Visit: Payer: Self-pay

## 2024-07-29 NOTE — Telephone Encounter (Signed)
 FYI Only or Action Required?: FYI only for provider.  Patient was last seen in primary care on 06/08/2024 by [name], NP.  Called Nurse Triage reporting Dysuria and Medication Refill.  Symptoms began a week ago.  Interventions attempted: Rest, hydration, or home remedies.  Symptoms are: gradually worsening.  Triage Disposition: See Physician Within 24 Hours  Patient/caregiver understands and will follow disposition?: Yes  Advised to go to UC for urinary symptoms d/t no appointment availability. Pt reports he will go no later than tomorrow morning.  Pt reports issue filling insulin  rx d/t insurance issues. Has not taken insulin  for the past month, reports CBG's have been in the 200's. [facility] and confirmed he received an 80 day supply of insulin  on 06/29/24. Called pt to notify, LVM with call back number.  Copied from CRM [id number]. Topic: Clinical - Red Word Triage >> Jul 29, 2024  3:23 PM [name] wrote: Red Word that prompted transfer to Nurse Triage: Patient thinks he has a bladder infection, burning when urinating for the past week.   Patient also states the pharmacy said the insurance company will not pay for his insulin  glargine (LANTUS ) 100 UNIT/ML Solostar Pen until sometime this month. States has not had insulin  for the past month. BS has been in the 200's but has only been 252 once after he ate. Is questioning what he should do.  Reason for Disposition  All other males with painful urination  Answer Assessment - Initial Assessment Questions 1. SEVERITY: How bad is the pain?  (e.g., Scale 1-10; mild, moderate, or severe)     Burning with urination  2. FREQUENCY: How many times have you had painful urination today?      Frequent trips to the bathroom due to drinking a lot of water.  3. PATTERN: Is pain present every time you urinate or just sometimes?      Mostly at night, not as much during the day  4. ONSET: When did the painful  urination start?      1 week  5. FEVER: Do you have a fever? If Yes, ask: What is your temperature, how was it measured, and when did it start?     No  6. PAST UTI: Have you had a urine infection before? If Yes, ask: When was the last time? and What happened that time?      No  7. CAUSE: What do you think is causing the painful urination?      Unsure  8. OTHER SYMPTOMS: Do you have any other symptoms? (e.g., flank pain, penis discharge, scrotal pain, blood in urine)     No  Protocols used: Urination Pain - Male-A-AH

## 2024-07-30 ENCOUNTER — Telehealth: Payer: Self-pay | Admitting: Pharmacist

## 2024-07-30 NOTE — Telephone Encounter (Signed)
 opied from CRM [id number]. Topic: Clinical - Red Word Triage >> Jul 29, 2024  3:23 PM [name] wrote: Red Word that prompted transfer to Nurse Triage: Patient thinks he has a bladder infection, burning when urinating for the past week.    Patient also states the pharmacy said the insurance company will not pay for his insulin  glargine (LANTUS ) 100 UNIT/ML Solostar Pen until sometime this month. States has not had insulin  for the past month. BS has been in the 200's but has only been 252 once after he ate. Is questioning what he should do.   Reason for Disposition  All other males with painful urination  Answer Assessment - Initial Assessment Questions 1. SEVERITY: How bad is the pain?  (e.g., Scale 1-10; mild, moderate, or severe)     Burning with urination  2. FREQUENCY: How many times have you had painful urination today?      Frequent trips to the bathroom due to drinking a lot of water.  3. PATTERN: Is pain present every time you urinate or just sometimes?      Mostly at night, not as much during the day  4. ONSET: When did the painful urination start?      1 week  5. FEVER: Do you have a fever? If Yes, ask: What is your temperature, how was it measured, and when did it start?     No  6. PAST UTI: Have you had a urine infection before? If Yes, ask: When was the last time? and What happened that time?      No  7. CAUSE: What do you think is causing the painful urination?      Unsure  8. OTHER SYMPTOMS: Do you have any other symptoms? (e.g., flank pain, penis discharge, scrotal pain, blood in urine)     No  Protocols used: Urination Pain - Male-A-AH

## 2024-08-01 ENCOUNTER — Other Ambulatory Visit: Payer: Self-pay | Admitting: Nurse Practitioner

## 2024-08-02 NOTE — Telephone Encounter (Signed)
 Done [facility]

## 2024-08-03 ENCOUNTER — Telehealth: Payer: Self-pay

## 2024-08-03 NOTE — Telephone Encounter (Signed)
 Received message on 07/30/24 that patient was out of insulin . Covering pharmacist was unable to reach patient and pharmacy confirmed that he got 84ds of Lantus  Solostar Pens on 06/29/24. Called patient today who confirms that he does have the insulin  pens and pen needles and was waiting until our appt tomorrow to start using them, as he needs education on how to administer injection, although we discussed this at our previous appt. He has been out of insulin  vial for ~1 mo and has not taken any insulin  in that time period. A refill of the vials was sent to his pharmacy yesterday.   Instructed patient to bring insulin  pens and pen needles with him to appt tomorrow and we will review injection technique and restart insulin .   Lorain Baseman, PharmD [facility] [phone]

## 2024-08-04 ENCOUNTER — Ambulatory Visit: Payer: Self-pay

## 2024-08-04 VITALS — BP 133/79 | HR 63

## 2024-08-04 DIAGNOSIS — E118 Type 2 diabetes mellitus with unspecified complications: Secondary | ICD-10-CM | POA: Diagnosis not present

## 2024-08-04 DIAGNOSIS — Z794 Long term (current) use of insulin: Secondary | ICD-10-CM | POA: Diagnosis not present

## 2024-08-04 DIAGNOSIS — I6302 Cerebral infarction due to thrombosis of basilar artery: Secondary | ICD-10-CM

## 2024-08-04 DIAGNOSIS — I5189 Other ill-defined heart diseases: Secondary | ICD-10-CM

## 2024-08-04 LAB — POCT GLYCOSYLATED HEMOGLOBIN (HGB A1C): HbA1c, POC (controlled diabetic range): 8.6 % — AB (ref 0.0–7.0)

## 2024-08-04 MED ORDER — FUROSEMIDE 40 MG PO TABS: 40.0000 mg | ORAL_TABLET | Freq: Every day | ORAL | 1 refills | Status: AC | PRN

## 2024-08-04 MED ORDER — APIXABAN 5 MG PO TABS: 5.0000 mg | ORAL_TABLET | Freq: Two times a day (BID) | ORAL | 3 refills | Status: AC

## 2024-08-04 MED ORDER — FREESTYLE LIBRE 3 PLUS SENSOR MISC: 3 refills | Status: DC

## 2024-08-04 NOTE — Progress Notes (Signed)
 08/04/2024 Name: [name] MRN: [id number] DOB: 1956/06/01  Chief Complaint  Patient presents with   Diabetes   Hypertension    [name] is a 68 y.o. year old male who was referred for medication management by their primary care provider, Brian Bascom RAMAN, NP. They presented for a face to face visit today.   They were referred to the pharmacist by their PCP for assistance in managing diabetes . 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   Subjective: Patient was last seen by PCP, [name], NP, on 06/08/24 for hospital follow-up after the patient visited the ED for an ear infection. He was previously seen for a routine visit by his PCP on 05/17/24, at which point he reported difficulty obtaining medications. BP has been controlled. Patient was engaged by pharmacy via telephone on 05/20/24. He reported he was able to pick up his medications from the pharmacy and had been taking his insulin . He was not at home to review his medication bottles with me. He was provided with an Rx for Accu Chek meter to begin monitoring his BG. He was advised to obtain nicotine  replacement therapy from the [facility]. He was seen by Dr. Anner (cardiology) on 06/14/24. He was advised to take carvedilol  twice daily as prescribed and continue all other medications. He reported that he has remained abstinent from alcohol. Advised that if BP remained uncontrolled, would consider initiating and ARB. Patient reported awaiting a shipment of nicotine  patches from the [place]. At in person pharmacy appt on 06/29/24, patient reported issues with medication costs. He had difficulty using his vial of insulin  and wanted to switch to insulin  pens. His amlodipine  was decreased to 5 mg daily due to soft BP.  Today, patient presents in  good spirits and presents without  any assistance. He brings all his medications and CGM supplies with him for  application. He denies any issues with fluid accumulation recently. Noted in reviewing his medications that he has not been taking Eliquis , but he does have a bottle with him in his old medications bag. He was agreeable to restarting this.   Care Team: Primary Care Provider: [name], NP ; Next Scheduled Visit: 09/09/24  Medication Access/Adherence  Current Pharmacy:  [id number] [place] [place] [address] [place] [address] Phone: [phone] Fax: [phone]   Patient reports affordability concerns with their medications: No  Patient reports access/transportation concerns to their pharmacy: No  Patient reports adherence concerns with their medications:  Yes  - he still reports some confusion about his medication regimen since leaving the hospital. Denies missed doses of insulin . He has been out of amlodipine  for 2 days. He is concerned about whether atorvastatin  and Eliquis  are together with the pills he has been taking daily. Reports he has been taking carvedilol  twice daily since his cardiology appt.  Diabetes:  Current medications: insulin  glargine (Lantus  vial) 35 units daily (reports he has not taken any insulin  in ~1 mo because he wanted education on how to use the insulin  pens), metformin  IR 500 mg daily, also taking Farxiga  10 mg daily though this was taken off his med list at hospital discharge Medications tried in the past: N/A  Current glucose readings: Does not have meter with him to review today.  Using Accu Chek meter; testing a couple times per week  Denies recent s/sx of hypoglycemia.  Patient denies hyperglycemic symptoms including polyuria, polydipsia, polyphagia, nocturia, neuropathy, blurred vision.  Current meal patterns: Eating ~ 2 meals/day. Eats snacks throughout the day. Reports he does not eat a lot of meat.  - Breakfast: did not discuss today  - Supper: a little bit of rice, vegetables (pinto  beans, green beans), corn, sweat peas. Has spaghetti, lasagna.  - Snacks: PB nabs, graham crackers. Piece of fruit (tangerine, grapes). Cut out potato chips.  - Drinks: he does have some sugary drinks, tries to pick lite or diet options.   Current physical activity: staying active outside around the house.   Current medication access support: [employer] Dual Complete  Hypertension:  Current medications: amlodipine  5 mg daily, carvedilol  12.5 mg BID, spironolactone  25 mg daily, furosemide  40 mg daily  Patient does not have a validated, automated, upper arm home BP cuff Current blood pressure readings readings: cannot recall specific numbers, says they have been lower (< 130/80 mmHg)  Patient denies hypotensive s/sx including dizziness, lightheadedness.  Patient denies hypertensive symptoms including headache, chest pain, shortness of breath   Hyperlipidemia/ASCVD Risk Reduction  Current lipid lowering medications: atorvastatin  40 mg daily Medications tried in the past: N/A  Antiplatelet regimen: Eliquis  5 mg BID (identified today that patient has not been taking, he is willing to resume)  ASCVD History: CVA Risk Factors: HTN, T2DM, tobacco use  Clinical ASCVD: Yes  The ASCVD Risk score (Arnett DK, et al., 2019) failed to calculate for the following reasons:   Risk score cannot be calculated because patient has a medical history suggesting prior/existing ASCVD    Objective:  BP Readings from Last 3 Encounters:  08/04/24 133/79  07/02/24 121/68  06/29/24 (!) 121/52    Lab Results  Component Value Date   HGBA1C 8.6 (A) 08/04/2024   HGBA1C 6.1 (H) 04/02/2024   HGBA1C 6.3 (A) 04/02/2024       Latest Ref Rng & Units 05/17/2024   12:03 PM 05/05/2024    3:59 AM 05/04/2024    2:36 PM  BMP  Glucose 70 - 99 mg/dL 848  864  827   BUN 8 - 27 mg/dL 3  14  11    Creatinine 0.76 - 1.27 mg/dL 9.28  9.35  9.41   BUN/Creat Ratio 10 - 24 4     Sodium 134 - 144 mmol/L 137  133  131    Potassium 3.5 - 5.2 mmol/L 3.4  4.0  4.2   Chloride 96 - 106 mmol/L 101  98  96   CO2 20 - 29 mmol/L 21  22  23    Calcium  8.6 - 10.2 mg/dL 9.3  9.5  9.1     Lab Results  Component Value Date   CHOL 117 10/28/2023   HDL 50 10/28/2023   LDLCALC 49 10/28/2023   LDLDIRECT 96.5 05/14/2021   TRIG 95 10/28/2023   CHOLHDL 2.3 10/28/2023    Medications Reviewed Today     Reviewed by Brinda Lorain SQUIBB, RPH (Pharmacist) on 08/04/24 at 1812  Med List Status: <None>   Medication Order Taking? Sig Documenting Provider Last Dose Status Informant  Accu-Chek Softclix Lancets lancets [id number]  Use as instructed to monitor blood glucose twice daily Nichols, Tonya S, NP  Active   amLODipine  (NORVASC ) 5 MG tablet [id number] Yes Take 1 tablet (5 mg total) by mouth daily. Brian Bascom RAMAN, NP  Active   apixaban  (ELIQUIS ) 5 MG TABS tablet [id number]  Take 1 tablet (5 mg total) by mouth 2 (two) times  daily. Brian Bascom RAMAN, NP  Active   atorvastatin  (LIPITOR) 40 MG tablet [id number] Yes Take 1 tablet (40 mg total) by mouth at bedtime. Brian Bascom RAMAN, NP  Active   Blood Glucose Monitoring Suppl (ACCU-CHEK GUIDE ME) w/Device KIT [id number]  Use as directed to monitor blood glucose Brian Bascom RAMAN, NP  Active   carvedilol  (COREG ) 12.5 MG tablet [id number] Yes Take 1 tablet (12.5 mg total) by mouth 2 (two) times daily with a meal. Brian Bascom RAMAN, NP  Active   Continuous Glucose Receiver (FREESTYLE LIBRE 3 READER) [place] [id number]  Use as directed with Freestyle Libre 3 sensor. Brian Bascom RAMAN, NP  Active   Continuous Glucose Sensor (FREESTYLE LIBRE 3 PLUS SENSOR) [place] [id number]  Change sensor every 15 days. Brian Bascom RAMAN, NP  Active   FARXIGA  10 MG TABS tablet [id number] Yes Take 10 mg by mouth every morning. [provider]  Active   folic acid  (FOLVITE ) 1 MG tablet [id number]  TAKE 1 TABLET BY MOUTH EVERY DAY Nichols, Tonya S, NP  Active   furosemide  (LASIX ) 40 MG tablet [id number]  Take 1 tablet (40 mg  total) by mouth daily as needed. For fluid or swelling (gain 2-3 lbs in one day or 5 lbs in one week). Brian Bascom RAMAN, NP  Active   glucose blood (ACCU-CHEK GUIDE TEST) test strip [id number]  Use as instructed to monitor blood glucose twice daily Nichols, Tonya S, NP  Active   hydrOXYzine  (ATARAX ) 25 MG tablet [id number]  Take 1 tablet (25 mg total) by mouth daily as needed.  Patient not taking: Reported on 07/14/2024   Kapoor, Sahil, MD  Active   insulin  glargine (LANTUS ) 100 UNIT/ML injection [id number]  INJECT 0.35 MLS (35 UNITS TOTAL) INTO THE SKIN DAILY. Brian Bascom RAMAN, NP  Active   insulin  glargine (LANTUS ) 100 UNIT/ML Solostar Pen [id number]  Inject 35 Units into the skin daily. Brian Bascom RAMAN, NP  Active   Insulin  Pen Needle (PEN NEEDLES) 32G X 4 MM MISC [id number]  Use to inject insulin  once daily Nichols, Tonya S, NP  Active   melatonin 5 MG TABS [id number]  Take 1 tablet (5 mg total) by mouth at bedtime. Kapoor, Sahil, MD  Active   metFORMIN  (GLUCOPHAGE ) 500 MG tablet [id number] Yes Take 500 mg by mouth daily. [provider]  Active   nicotine  (NICODERM CQ  - DOSED IN MG/24 HOURS) 14 mg/24hr patch [id number]  Place 1 patch (14 mg total) onto the skin daily. Change every 24 hours. Brian Bascom RAMAN, NP  Active   ofloxacin  (FLOXIN ) 0.3 % OTIC solution [id number]  Place 5 drops into the left ear 2 (two) times daily. Brian Bascom RAMAN, NP  Active   oxyCODONE -acetaminophen  (PERCOCET) 10-325 MG tablet [id number]  Take 1 tablet by mouth 5 (five) times daily. [provider]  Active   spironolactone  (ALDACTONE ) 25 MG tablet [id number] Yes TAKE 1 TABLET (25 MG TOTAL) BY MOUTH DAILY. Brian Bascom RAMAN, NP  Active   tamsulosin  (FLOMAX ) 0.4 MG CAPS capsule [id number] Yes Take 2 capsules (0.8 mg total) by mouth daily. Brian Bascom RAMAN, NP  Active   traZODone  (DESYREL ) 100 MG tablet [id number]  Take 100 mg by mouth at bedtime as needed. [provider]  Active   Med List Note Steffi Nian, CPhT 04/20/24 9045): Multiple social barriers impact patients compliance/accurate dispense history. Sometimes goes to [facility].               Assessment/Plan:  Diabetes: - Currently uncontrolled with most recent A1C of 8.6% above goal <7%, and worsened in the setting of being out of insulin  for one month. Previously, BG appeared controlled on 35 units of insulin . Will restart at 30 units today as patient is currently adherence to metformin  and Farxiga . Although Farxiga  was discontinued at hospital discharge, I think it is reasonable for him to continue taking this medication in setting of possible HFpEF and microalbuminuria. Appears it was removed from list because they did not think patient was taking, though there was some concern that it could have worsened his pancreatitis (rare side effect). Will continue to monitor closely. If patient is able to successfully start using CGM, we will be able to better titrate his insulin  moving forward.   He is not a candidate for GLP-1RA given recent pancreatitis. - Last UACR 05/17/24: 111 mg/g - Reviewed long term cardiovascular and renal outcomes of uncontrolled blood sugar - Reviewed goal A1c, goal fasting, and goal 2 hour post prandial glucose - Reviewed hypoglycemia management plan and the rule of 15 - Reviewed dietary modifications including  utilizing the healthy plate method, limiting portion size of carbohydrate foods, increasing intake of protein and non-starchy vegetables. Counseled patient to stay hydrated with water throughout the day. Provided with handout today. - Reviewed lifestyle modifications including: aiming for 150 minutes of moderate intensity exercise every week.  - Recommend to restart insulin  glargine (Lantus ) 30 daily. Patient was educated on use of Lantus  solostar pen and successfully administered an injection in clinic under my supervision.  - Recommend to continue metformin  IR 500 mg daily. Consider titrating up at  follow-up if BG remain uncontrolled. - Recommend to use FL3+ CGM to monitor BG continuously. Patient was educated on application today, though we were not able to fully start his sensor as he could not recall his email address and we could not make him an account. Patient's daughter should be able to help him set up account and apply at home.  - Next A1C due 11/05/23     Hypertension: - Currently controlled with clinic BP close to goal less than 130/80. Patient is not having s/sx of hypo- or hyper-tension. Medication adherence appears appropriate. Patient continues to be euvolemic and potassium was low on last BMP in July, so will decrease furosemide  to PRN. Patient educated on when to take a dose. 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  - Reviewed long term cardiovascular and renal outcomes of uncontrolled blood pressure - Reviewed appropriate blood pressure monitoring technique and reviewed goal blood pressure.  - Recommend to continue amlodipine  5 mg daily, carvedilol  12.5 mg BID, spironolactone  25 mg daily - Recommend to decrease furosemide  to 40 mg daily PRN swelling or weight gain (2-3 lbs in one day or 5 lbs in one week)    Hyperlipidemia/ASCVD Risk Reduction: - Currently controlled with most recent LDL-C of 49 mg/dL below goal < 70 mg/dL given U7IF and hx of CVA. High intensity statin indicated. Appropriate to continue current regimen.  - Recommend to continue atorvastatin  40 mg daily - Recommend to repeat lipid panel at PCP appt in Nov 2025    Written patient instructions provided. Patient verbalized understanding of treatment plan.    Follow Up Plan:  Pharmacist in-person 09/29/24 PCP clinic visit 09/09/24   Lorain Baseman, PharmD [facility] [phone]

## 2024-08-04 NOTE — Patient Instructions (Addendum)
 It was nice to see you today! Your A1C today was 8.6%. Our goal is less than 7%.   Your goal blood sugar is 80-130 before eating and less than 180 after eating. Let me know if you have any problems with your sensor. I will request for you to get a 3 month supply next time from the pharmacy.  Medication Changes: Take furosemide  (Lasix ) 40 mg AS NEEDED if you have swelling or fluid build up  Continue Lantus  30 units once daily  Continue Farxiga  10 mg once daily  Continue metformin  500 mg once daily  Continue all other medication the same.   Monitor blood sugars at home and keep a log (glucometer or piece of paper) to bring with you to your next visit.  Keep up the good work with diet and exercise. Aim for a diet full of vegetables, fruit and lean meats (chicken, [place], fish). Try to limit salt intake by eating fresh or frozen vegetables (instead of canned), rinse canned vegetables prior to cooking and do not add any additional salt to meals.  ________________________________________________________________________  Sensor Application If using the App, you can tap Help in the Main Menu to access an in-app tutorial on applying a Sensor. See below for instructions on how to download the app. Apply Sensors only on the back of your upper arm. If placed in other areas, the Sensor may not function properly and could give you inaccurate readings. Avoid areas with scars, moles, stretch marks, or lumps.   Select an area of skin that generally stays flat during your normal daily activities (no bending or folding). Choose a site that is at least 1 inch (2.5 cm) away from any injection sites. To prevent discomfort or skin irritation, you should select a different site other than the one most recently used. Wash application site using a plain soap, dry, and then clean with an alcohol wipe. This will help remove any oily residue that may prevent the sensor from sticking properly. Allow site to air dry  before proceeding. Note: The area MUST be clean and dry, or the Sensor may not stay on for the full wear duration specified by your Sensor insert. 4. Unscrew the cap from the Sensor Applicator and set the cap aside.  5. Place the Sensor Applicator over the prepared site and push down firmly to apply the Sensor to your body. 6. Gently pull the Sensor Applicator away from your body. The Sensor should now be attached to your skin. 7. Make sure the Sensor is secure after application. Put the cap back on the Sensor Applicator. Discard the used Engineer, agricultural according to local regulations.  What If My Sensor Falls Off or What If My Sensor Isn't Working? Call [employer] Customer Care Team at [phone] Available 7 days a week from 8AM-8PM EST, excluding holidays If you have multiple sensors fall, contact [facility] at [phone]   The App Download the [facility] App in your phone's app store (black background with yellow butterfly) Load the app and select get started now Create an account  Tap scan new sensor Follow the prompts on the screen. If your sensor does not sync, try moving your phone slowly around the sensor. Phone cases may affect scanning. This will be the only time you have to scan the sensor until you apply a new sensor.   There will be a 60 minute start up period until the app will display your glucose reading   How To Share Your Readings  With Us  Once in the app, go to settings -> connected apps -> LibreView -> Enter Practice ID -> [id number]

## 2024-08-10 NOTE — Telephone Encounter (Signed)
 Unsuccessful outreach to patient RE: insulin . Left VM encouraging return call.  Lantus  pens [facility] for 84 day supply (it was picked up).  I called the pharmacy and they were unable to do override.  If he lost it, he would have to call the insurance.  I will help him if he calls back.  Forwarding to Layna for f/u on Monday.   Bernadette Armijo Dattero Zorianna Taliaferro, PharmD, BCACP, CPP Clinical Pharmacist, [facility]

## 2024-08-12 ENCOUNTER — Telehealth: Payer: Self-pay | Admitting: Pharmacy Technician

## 2024-08-12 ENCOUNTER — Telehealth: Payer: Self-pay

## 2024-08-12 DIAGNOSIS — E118 Type 2 diabetes mellitus with unspecified complications: Secondary | ICD-10-CM

## 2024-08-12 MED ORDER — METFORMIN HCL 500 MG PO TABS
500.0000 mg | ORAL_TABLET | Freq: Two times a day (BID) | ORAL | 3 refills | Status: AC
Start: 2024-08-12 — End: ?

## 2024-08-12 NOTE — Progress Notes (Signed)
 Re-attempted patient and had successful outreach. He confirmed he is doing well with Freestyle Libre 3+ sensor. He confirms he is still taking Lantus  30 units once daily, along with metformin  and Farxiga . Since patient continues to have some after meal spikes, requested that he increase metformin  IR 500 mg daily to 500 mg BID, continue Lantus  30 units daily, and Farxiga  10 mg daily. Patient in agreement with this plan. Re-educated on rule of 15, and instructed to call me if he has recurrent hypoglycemia, as we may need to decrease his dose of Lantus . Confirmed that the medications he will now be taking twice daily are Eliquis , carvedilol , and metformin .   Lorain Baseman, PharmD [facility] [phone]

## 2024-08-12 NOTE — Progress Notes (Signed)
 Attempted to contact patient to follow-up 1 week after initiation of CGM. Population health pharmacy tech, Jill Simcox, outreached patient this AM and got him connected to the [facility] clinic [facility]. AGP from the past week is included below. Patient had asked Kate instructions on what to do if BG is > 300 mg/dL. Will re-attempt to outreach patient at a later time to discuss possible medication adjustment and how to manage hyperglycemia.      Lorain Baseman, PharmD [facility] [phone]

## 2024-08-12 NOTE — Progress Notes (Signed)
 Atorvastatin  filled  Furosemide  filled /, / Ekiquis filled   And  08/12/2024 Name: [name] MRN: [id number] DOB: [birth year]  Patient is appearing for a follow-up visit with the population [facility] pharmacy , technician. Last engaged with the clinical pharmacist to discuss Spironolactone  filled  diabetes, hypertension, heart failure, and medication adherence on 08/04/2024. Contacted patient today to discuss diabetes, hypertension, heart failure, and medication adherence.   Plan from last clinical pharmacist appointment:  Diabetes: - Currently uncontrolled with most recent A1C of 8.6% above goal <7%, and worsened in the setting of being out of insulin  for one month. Previously, BG appeared controlled on 35 units of insulin . Will restart at 30 units today as patient is currently adherence to metformin  and Farxiga . Although Farxiga  was discontinued at hospital discharge, I think it is reasonable for him to continue taking this medication in setting of possible HFpEF and microalbuminuria. Appears it was removed from list because they did not think patient was taking, though there was some concern that it could have worsened his pancreatitis (rare side effect). Will continue to monitor closely. If patient is able to successfully start using CGM, we will be able to better titrate his insulin  moving forward.   He is not a candidate for GLP-1RA given recent pancreatitis. - Last UACR 05/17/24: 111 mg/g - Reviewed long term cardiovascular and renal outcomes of uncontrolled blood sugar - Reviewed goal A1c, goal fasting, and goal 2 hour post prandial glucose - Reviewed hypoglycemia management plan and the rule of 15 - Reviewed dietary modifications including  utilizing the healthy plate method, limiting portion size of carbohydrate foods, increasing intake of protein and non-starchy vegetables. Counseled patient to stay hydrated with water throughout the day. Provided with handout today. - Reviewed lifestyle  modifications including: aiming for 150 minutes of moderate intensity exercise every week.  - Recommend to restart insulin  glargine (Lantus ) 30 daily.,  Patient was educated on use of Lantus  solostar pen and successfully administered an injection in clinic under my supervision.  - Recommend to continue metformin  IR 500 mg daily. Consider titrating up at follow-up if BG remain uncontrolled. - Recommend to use FL3+ CGM to monitor BG continuously. Patient was educated on application today, though we were not able to fully start his sensor as he could not recall his email address and we could not make him an account. Patient's daughter should be able to help him set up account and apply at home.  - Next A1C due 11/05/23    Hypertension: - Currently controlled with clinic BP close to goal less than 130/80. Patient is not having s/sx of hypo- or hyper-tension. Medication adherence appears appropriate. Patient continues to be euvolemic and potassium was low on last BMP in July, so will decrease furosemide  to PRN. Patient educated on when to take a dose. 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  - Reviewed long term cardiovascular and renal outcomes of uncontrolled blood pressure - Reviewed appropriate blood pressure monitoring technique and reviewed goal blood pressure.  - Recommend to continue amlodipine  5 mg daily, carvedilol  12.5 mg BID, spironolactone  25 mg daily - Recommend to decrease furosemide  to 40 mg daily PRN swelling or weight gain (2-3 lbs in one day or 5 lbs in one week)  Hyperlipidemia/ASCVD Risk Reduction:  - Currently controlled with most recent LDL-C of 49 mg/dL below goal < 70 mg/dL given U7IF and hx of CVA. High intensity statin indicated. Appropriate  to continue current regimen.  - Recommend to continue atorvastatin  40 mg daily - Recommend to repeat lipid panel at PCP appt in Nov 2025    Written patient instructions provided. Patient verbalized  understanding of treatment plan.   Follow Up Plan:  Pharmacist in-person 09/29/24 PCP clinic visit 09/09/24   Medication Adherence Barriers Identified:  Patient made recommended medication changes per plan: Yes Patient informs he has made the changes as outlined by PharmD at his clinic appointment. He informs he is using Lantus  pens now and is using 30 units daily. He also informs he is taking Metformin  500mg  daily and Farixga 10mg  daily for his diabetes. For his blood pressure, he informs he is taking Amldopine and Spironolactone  daily as well as Carvedilol  twice a day. He informs he is only using Furosemide  as needed and is aware of when he is to take it and when not to take it. He also reports taking Atorvastatin  daily. He informs he has Eliquis  and takes it twice a day. Access issues with any new medication or testing device: No He reports having medications at his home. He does admit to having a low supply on a few of his medications but was not able to tell me which ones at the time of his call. He was encourage to go ahead and call those in for refills today. If less than 10 day supply remaining. Per Dr Annemarie fill history data, 6 FreeStyle sensors filled 10/8, 90 Metformin  filled 10/16, 30 Farixga filled 9/17, 90 Amlodipine  filled 9/3, 180 Carvediolol filled 9/22, 90 Spironolactone  and Furosemide  were filled 8/17, 90 Atrovastatin filled 10/16 and 180 Eliquis  filled 10/10. In regard to Lantus , a 10ml vial was filled 10/7 but Lantus  pens were filled for 30ml on 06/29/2024. Patient informs he is using pens now.  Patient is checking blood sugars as prescribed: Yes Patient is using Dexcom CGM. Was able to walk patient and his nephew thru how to connect the data to the clinic for PharmD to review. This morning patient reports a blood sugar of 155. He reports 1 episode this week when blood sugar went to 67. He informs he ate something and this blood sugar came back up. He questions what he is to do when  blood sugar goes to 300. He informs he has not been doing anything and waits and it will come back down. He inquires if he should be giving himself more insulin . Will send patient's question to PharmD to address.   Medication Adherence Barriers Addressed/Actions Taken:  Reviewed medication changes per plan from last clinical pharmacist note Educated patient to contact pharmacy regarding new prescriptions Reviewed instructions for monitoring blood sugars at home and reminded patient to keep a written log to review with pharmacist Reminded patient of date/time of upcoming clinical pharmacist follow up and any upcoming PCP/specialists visits. Patient denies transportation barriers to the appointment. Yes  Next clinical pharmacist appointment is scheduled for: 09/29/2024  Kate Caddy, CPhT [facility] Office: [phone] Email: [email or web address]

## 2024-09-08 ENCOUNTER — Telehealth: Payer: Self-pay

## 2024-09-08 NOTE — Progress Notes (Signed)
 Patient calling in reporting that he ran out of [place] 3+ sensors a few days ago. Refills for 3 mo supply [facility]. Patient requested that I assist him in asking for refill. Reviewed Herlene report (download 09/04/24) which was improving and stable. Will continue to adjust regimen at follow-up appt.      Lorain Baseman, PharmD [facility] [phone]

## 2024-09-09 ENCOUNTER — Ambulatory Visit: Payer: Self-pay | Admitting: Nurse Practitioner

## 2024-09-09 ENCOUNTER — Encounter: Payer: Self-pay | Admitting: Nurse Practitioner

## 2024-09-09 VITALS — BP 154/74 | HR 80 | Wt 150.6 lb

## 2024-09-09 DIAGNOSIS — E118 Type 2 diabetes mellitus with unspecified complications: Secondary | ICD-10-CM | POA: Diagnosis not present

## 2024-09-09 DIAGNOSIS — Z23 Encounter for immunization: Secondary | ICD-10-CM | POA: Diagnosis not present

## 2024-09-09 DIAGNOSIS — I10 Essential (primary) hypertension: Secondary | ICD-10-CM

## 2024-09-09 DIAGNOSIS — E785 Hyperlipidemia, unspecified: Secondary | ICD-10-CM | POA: Diagnosis not present

## 2024-09-09 DIAGNOSIS — Z794 Long term (current) use of insulin: Secondary | ICD-10-CM

## 2024-09-09 LAB — POCT URINALYSIS DIP (CLINITEK)
Bilirubin, UA: NEGATIVE
Blood, UA: NEGATIVE
Glucose, UA: >=1000 mg/dL — AB
Ketones, POC UA: NEGATIVE mg/dL
Leukocytes, UA: NEGATIVE
Nitrite, UA: NEGATIVE
POC PROTEIN,UA: >=300 — AB
Spec Grav, UA: 1.025 (ref 1.010–1.025)
Urobilinogen, UA: 1 U/dL
pH, UA: 7 (ref 5.0–8.0)

## 2024-09-09 LAB — POCT GLYCOSYLATED HEMOGLOBIN (HGB A1C): Hemoglobin A1C: 9 % — AB (ref 4.0–5.6)

## 2024-09-09 NOTE — Progress Notes (Signed)
 Subjective   Patient ID: [name], male    DOB: Jul 09, 1956, 68 y.o.   MRN: [id number]  Chief Complaint  Patient presents with   Ear Fullness   Diabetes    Referring provider: [name], NP  [name] is a 68 y.o. male with Past Medical History: No date: Alcohol abuse No date: Allergic rhinitis No date: CHF (congestive heart failure) (HCC) No date: Diabetes mellitus without complication (HCC)     Comment:  'sometime last yr'--type 2 No date: GERD (gastroesophageal reflux disease) No date: Hepatitis     Comment:  he thinks its hep b or c No date: Hyperlipidemia No date: Hypertension No date: Osteoarthritis No date: Pancreatitis No date: Rheumatoid arthritis(714.0) No date: Stroke (HCC) No date: Tobacco user  HPI  Diabetes:   Current medications: insulin  glargine (Lantus  vial) 30 units daily (reports he has not taken any insulin  in ~1 mo because he wanted education on how to use the insulin  pens), metformin  IR 500 mg daily, also taking Farxiga  10 mg daily though this was taken off his med list at hospital discharge Medications tried in the past: N/A    Patient denies hyperglycemic symptoms including polyuria, polydipsia, polyphagia, nocturia, neuropathy, blurred vision.   Pharmacy is following for medication management   Hypertension:   Current medications: amlodipine  5 mg daily, carvedilol  12.5 mg BID, spironolactone  25 mg daily, furosemide  40 mg daily   Patient does not check BP at home.    Patient denies hypotensive s/sx including dizziness, lightheadedness.  Patient denies hypertensive symptoms including headache, chest pain, shortness of breath     Hyperlipidemia/ASCVD Risk Reduction   Current lipid lowering medications: atorvastatin  40 mg daily Medications tried in the past: N/A   Antiplatelet regimen: Eliquis  5 mg BID (identified today that patient has not been taking, he is willing to resume)   ASCVD History: CVA Risk Factors:  HTN, T2DM, tobacco use   Denies f/c/s, n/v/d, hemoptysis, PND, leg swelling Denies chest pain or edema    Allergies  Allergen Reactions   Lisinopril  Swelling and Other (See Comments)    angioedema   Citalopram Nausea Only   Morphine  Nausea And Vomiting   Nsaids Other (See Comments)    Stomach Irritability    Paroxetine Nausea And Vomiting    Immunization History  Administered Date(s) Administered   Dtap, Unspecified 07/24/1967   Fluad Quad(high Dose 65+) 12/13/2021, 10/11/2022   INFLUENZA, HIGH DOSE SEASONAL PF 07/23/2023, 09/09/2024   Influenza Whole 10/17/2010   Influenza, Seasonal, Injecte, Preservative Fre 10/06/2012, 07/27/2013, 11/23/2014   Influenza,inj,Quad PF,6+ Mos 09/09/2016, 10/01/2017, 11/13/2018   Influenza-Unspecified 10/04/2004, 08/28/2010, 09/25/2011, 10/28/2012, 10/11/2022   PFIZER(Purple Top)SARS-COV-2 Vaccination 05/17/2020, 06/08/2020   PNEUMOCOCCAL CONJUGATE-20 01/13/2023   Pneumococcal Polysaccharide-23 01/24/2004, 02/12/2017   Polio, Unspecified 07/24/1967   Respiratory Syncytial Virus Vaccine,Recomb Aduvanted(Arexvy) 10/11/2022   Td 10/17/2010   Td (Adult),5 Lf Tetanus Toxid, Preservative Free 12/13/2021   Tdap 05/19/2012, 08/01/2020    Tobacco History: Social History   Tobacco Use  Smoking Status Every Day   Current packs/day: 0.40   Average packs/day: 0.4 packs/day for 25.0 years (10.0 ttl pk-yrs)   Types: Cigarettes   Passive exposure: Current  Smokeless Tobacco Never  Tobacco Comments   Trying to quit smoking.  Currently smoking 1 -2 cigarette per day and is doing OK with decrease.   Ready to quit: Not Answered Counseling given: Not Answered Tobacco comments: Trying to quit smoking.  Currently smoking 1 -2 cigarette per day  and is doing OK with decrease.   Outpatient Encounter Medications as of 09/09/2024  Medication Sig   Accu-Chek Softclix Lancets lancets Use as instructed to monitor blood glucose twice daily   amLODipine   (NORVASC ) 5 MG tablet Take 1 tablet (5 mg total) by mouth daily.   apixaban  (ELIQUIS ) 5 MG TABS tablet Take 1 tablet (5 mg total) by mouth 2 (two) times daily.   atorvastatin  (LIPITOR) 40 MG tablet Take 1 tablet (40 mg total) by mouth at bedtime.   Blood Glucose Monitoring Suppl (ACCU-CHEK GUIDE ME) w/Device KIT Use as directed to monitor blood glucose   carvedilol  (COREG ) 12.5 MG tablet Take 1 tablet (12.5 mg total) by mouth 2 (two) times daily with a meal.   Continuous Glucose Receiver (FREESTYLE LIBRE 3 READER) DEVI Use as directed with Freestyle Libre 3 sensor.   Continuous Glucose Sensor (FREESTYLE LIBRE 3 PLUS SENSOR) MISC Change sensor every 15 days.   FARXIGA  10 MG TABS tablet Take 10 mg by mouth every morning.   folic acid  (FOLVITE ) 1 MG tablet TAKE 1 TABLET BY MOUTH EVERY DAY   furosemide  (LASIX ) 40 MG tablet Take 1 tablet (40 mg total) by mouth daily as needed. For fluid or swelling (gain 2-3 lbs in one day or 5 lbs in one week).   glucose blood (ACCU-CHEK GUIDE TEST) test strip Use as instructed to monitor blood glucose twice daily   insulin  glargine (LANTUS ) 100 UNIT/ML Solostar Pen Inject 35 Units into the skin daily.   Insulin  Pen Needle (PEN NEEDLES) 32G X 4 MM MISC Use to inject insulin  once daily   melatonin 5 MG TABS Take 1 tablet (5 mg total) by mouth at bedtime.   metFORMIN  (GLUCOPHAGE ) 500 MG tablet Take 1 tablet (500 mg total) by mouth 2 (two) times daily with a meal.   nicotine  (NICODERM CQ  - DOSED IN MG/24 HOURS) 14 mg/24hr patch Place 1 patch (14 mg total) onto the skin daily. Change every 24 hours.   ofloxacin  (FLOXIN ) 0.3 % OTIC solution Place 5 drops into the left ear 2 (two) times daily.   oxyCODONE -acetaminophen  (PERCOCET) 10-325 MG tablet Take 1 tablet by mouth 5 (five) times daily.   spironolactone  (ALDACTONE ) 25 MG tablet TAKE 1 TABLET (25 MG TOTAL) BY MOUTH DAILY.   tamsulosin  (FLOMAX ) 0.4 MG CAPS capsule Take 2 capsules (0.8 mg total) by mouth daily.    traZODone  (DESYREL ) 100 MG tablet Take 100 mg by mouth at bedtime as needed.   hydrOXYzine  (ATARAX ) 25 MG tablet Take 1 tablet (25 mg total) by mouth daily as needed. (Patient not taking: Reported on 09/09/2024)   No facility-administered encounter medications on file as of 09/09/2024.    Review of Systems  Review of Systems  Constitutional: Negative.   HENT: Negative.    Cardiovascular: Negative.   Gastrointestinal: Negative.   Allergic/Immunologic: Negative.   Neurological: Negative.   Psychiatric/Behavioral: Negative.       Objective:   BP (!) 154/74 (BP Location: Left Arm, Patient Position: Sitting, Cuff Size: Normal)   Pulse 80   Wt 150 lb 9.6 oz (68.3 kg)   SpO2 99%   BMI 23.59 kg/m   Wt Readings from Last 5 Encounters:  09/09/24 150 lb 9.6 oz (68.3 kg)  07/14/24 148 lb (67.1 kg)  06/14/24 147 lb 4.8 oz (66.8 kg)  06/08/24 146 lb 3.2 oz (66.3 kg)  05/25/24 150 lb 8 oz (68.3 kg)     Physical Exam Vitals and nursing note reviewed.  Constitutional:      General: He is not in acute distress.    Appearance: He is well-developed.  Cardiovascular:     Rate and Rhythm: Normal rate and regular rhythm.  Pulmonary:     Effort: Pulmonary effort is normal.     Breath sounds: Normal breath sounds.  Skin:    General: Skin is warm and dry.  Neurological:     Mental Status: He is alert and oriented to person, place, and time.       Assessment & Plan:   Type 2 diabetes mellitus with complication, with long-term current use of insulin  (HCC) -     POCT glycosylated hemoglobin (Hb A1C) -     Microalbumin / creatinine urine ratio -     POCT URINALYSIS DIP (CLINITEK)  Essential (primary) hypertension -     CBC -     Comprehensive metabolic panel with GFR  Hyperlipidemia LDL goal <55 -     Lipid panel  Other orders -     Flu vaccine HIGH DOSE PF(Fluzone Trivalent)     Return in about 3 months (around 12/10/2024).     Bascom GORMAN Borer, NP 09/09/2024

## 2024-09-10 ENCOUNTER — Ambulatory Visit: Payer: Self-pay | Admitting: Nurse Practitioner

## 2024-09-10 DIAGNOSIS — N289 Disorder of kidney and ureter, unspecified: Secondary | ICD-10-CM

## 2024-09-10 LAB — CBC
Hematocrit: 47.8 % (ref 37.5–51.0)
Hemoglobin: 16 g/dL (ref 13.0–17.7)
MCH: 31 pg (ref 26.6–33.0)
MCHC: 33.5 g/dL (ref 31.5–35.7)
MCV: 93 fL (ref 79–97)
Platelets: 273 x10E3/uL (ref 150–450)
RBC: 5.16 x10E6/uL (ref 4.14–5.80)
RDW: 13 % (ref 11.6–15.4)
WBC: 9 x10E3/uL (ref 3.4–10.8)

## 2024-09-10 LAB — COMPREHENSIVE METABOLIC PANEL WITH GFR
ALT: 14 IU/L (ref 0–44)
AST: 17 IU/L (ref 0–40)
Albumin: 4.5 g/dL (ref 3.9–4.9)
Alkaline Phosphatase: 120 IU/L (ref 47–123)
BUN/Creatinine Ratio: 13 (ref 10–24)
BUN: 10 mg/dL (ref 8–27)
Bilirubin Total: 0.5 mg/dL (ref 0.0–1.2)
CO2: 23 mmol/L (ref 20–29)
Calcium: 10 mg/dL (ref 8.6–10.2)
Chloride: 97 mmol/L (ref 96–106)
Creatinine, Ser: 0.75 mg/dL — ABNORMAL LOW (ref 0.76–1.27)
Globulin, Total: 3.3 g/dL (ref 1.5–4.5)
Glucose: 282 mg/dL — ABNORMAL HIGH (ref 70–99)
Potassium: 4.6 mmol/L (ref 3.5–5.2)
Sodium: 133 mmol/L — ABNORMAL LOW (ref 134–144)
Total Protein: 7.8 g/dL (ref 6.0–8.5)
eGFR: 99 mL/min/1.73 (ref >59)

## 2024-09-10 LAB — MICROALBUMIN / CREATININE URINE RATIO
Creatinine, Urine: 70 mg/dL
Microalb/Creat Ratio: 1212 mg/g{creat} — ABNORMAL HIGH (ref 0–29)
Microalbumin, Urine: 848.4 ug/mL

## 2024-09-10 LAB — LIPID PANEL
Chol/HDL Ratio: 2.8 ratio (ref 0.0–5.0)
Cholesterol, Total: 92 mg/dL — ABNORMAL LOW (ref 100–199)
HDL: 33 mg/dL — ABNORMAL LOW (ref >39)
LDL Chol Calc (NIH): 43 mg/dL (ref 0–99)
Triglycerides: 76 mg/dL (ref 0–149)
VLDL Cholesterol Cal: 16 mg/dL (ref 5–40)

## 2024-09-28 ENCOUNTER — Other Ambulatory Visit: Payer: Self-pay | Admitting: Nurse Practitioner

## 2024-09-28 ENCOUNTER — Ambulatory Visit: Payer: Self-pay | Admitting: *Deleted

## 2024-09-28 ENCOUNTER — Telehealth: Payer: Self-pay | Admitting: Nurse Practitioner

## 2024-09-28 DIAGNOSIS — Z794 Long term (current) use of insulin: Secondary | ICD-10-CM

## 2024-09-28 DIAGNOSIS — E118 Type 2 diabetes mellitus with unspecified complications: Secondary | ICD-10-CM

## 2024-09-28 NOTE — Telephone Encounter (Signed)
 FYI Only or Action Required?: FYI only for provider: call transferred to office.  Patient was last seen in primary care on 09/09/2024 by [name], NP.  Called Nurse Triage reporting Hyperglycemia.  Symptoms began today.  Interventions attempted: Other: Patient has been using his prescribed medications- he is out of farxiga .  Symptoms are: gradually worsening.  Triage Disposition: Call PCP Now  Patient/caregiver understands and will follow disposition?: Yes  Call transferred to [facility]- appointment offered- patient may be referred to UC if he can not make the appointment time.   Copied from CRM #[id number]. Topic: Clinical - Red Word Triage >> Sep 28, 2024  1:54 PM Carla L wrote: Red Word that prompted transfer to Nurse Triage: blood sugar is really high, 350 took medications at 10:30 am Reason for Disposition  [1] Blood glucose > 300 mg/dL (83.2 mmol/L) AND [7] two or more times in a row  Answer Assessment - Initial Assessment Questions 1. BLOOD GLUCOSE: What is your blood glucose level?      350, 378 2. ONSET: When did you check the blood glucose?     10:30, 2:00 3. USUAL RANGE: What is your glucose level usually? (e.g., usual fasting morning value, usual evening value)     200-250- patient had been out of medications- has restarted 4. KETONES: Do you check for ketones (urine or blood test strips)? If Yes, ask: What does the test show now?      na 5. TYPE 1 or 2:  Do you know what type of diabetes you have?  (e.g., Type 1, Type 2, Gestational; doesn't know)      Type 2 6. INSULIN : Do you take insulin ? What type of insulin (s) do you use? What is the mode of delivery? (syringe, pen; injection or pump)?      Yes- lantus   30units 7. DIABETES PILLS: Do you take any pills for your diabetes? If Yes, ask: Have you missed taking any pills recently?      Metformin          Out of farxiga , just restarted spironolactone    8. OTHER SYMPTOMS: Do you have any symptoms?  (e.g., fever, frequent urination, difficulty breathing, dizziness, weakness, vomiting)     no  Protocols used: Diabetes - High Blood Sugar-A-AH

## 2024-09-28 NOTE — Telephone Encounter (Unsigned)
 Copied from CRM [id number]. Topic: Clinical - Medication Refill >> Sep 28, 2024  3:11 PM Antwanette L wrote: Medication: FARXIGA  10 MG TABS tablet  Has the patient contacted their pharmacy? Yes   This is the patient's preferred pharmacy:  [id number] [address] [place] - [address] [place] [address] Phone: [phone] Fax: [phone]  Is this the correct pharmacy for this prescription? Yes   Has the prescription been filled recently? Yes. Last refill was on 07/10/24  Is the patient out of the medication? Yes  Has the patient been seen for an appointment in the last year OR does the patient have an upcoming appointment? Yes. Last ov with Bascom Borer  NP was on 09/09/24  Can we respond through MyChart? No. Contact the patient by phone at [phone]  Agent: Please be advised that Rx refills may take up to 3 business days. We ask that you follow-up with your pharmacy.

## 2024-09-29 ENCOUNTER — Ambulatory Visit (INDEPENDENT_AMBULATORY_CARE_PROVIDER_SITE_OTHER): Payer: Self-pay

## 2024-09-29 VITALS — BP 125/57 | HR 53

## 2024-09-29 DIAGNOSIS — E118 Type 2 diabetes mellitus with unspecified complications: Secondary | ICD-10-CM

## 2024-09-29 DIAGNOSIS — Z794 Long term (current) use of insulin: Secondary | ICD-10-CM

## 2024-09-29 MED ORDER — DAPAGLIFLOZIN PROPANEDIOL 10 MG PO TABS: 10.0000 mg | ORAL_TABLET | Freq: Every morning | ORAL | 3 refills | Status: DC

## 2024-09-29 NOTE — Progress Notes (Signed)
 09/29/2024 Name: [name] MRN: [id number] DOB: Nov 22, 1955  Chief Complaint  Patient presents with   Diabetes    [name] is a 68 y.o. year old male who was referred for medication management by their primary care provider, [name], NP. They presented for a face to face visit today.   They were referred to the pharmacist by their PCP for assistance in managing diabetes . 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   Subjective: Patient was last seen by PCP, [name], NP, on 06/08/24 for hospital follow-up after the patient visited the ED for an ear infection. He was previously seen for a routine visit by his PCP on 05/17/24, at which point he reported difficulty obtaining medications. BP has been controlled. Patient was engaged by pharmacy via telephone on 05/20/24. He reported he was able to pick up his medications from the pharmacy and had been taking his insulin . He was not at home to review his medication bottles with me. He was provided with an Rx for Accu Chek meter to begin monitoring his BG. He was advised to obtain nicotine  replacement therapy from the [facility]. He was seen by Dr. Anner (cardiology) on 06/14/24. He was advised to take carvedilol  twice daily as prescribed and continue all other medications. He reported that he has remained abstinent from alcohol. Advised that if BP remained uncontrolled, would consider initiating and ARB. Patient reported awaiting a shipment of nicotine  patches from the [place]. At in person pharmacy appt on 06/29/24, patient reported issues with medication costs. He had difficulty using his vial of insulin  and wanted to switch to insulin  pens. His amlodipine  was decreased to 5 mg daily due to soft BP. At pharmacy appt on 08/04/24, patient was educated how to use insulin  pens and resumed Lantus  at 30 units daily. Provided education on [place] 3 CGM. After  reviewing CGM data, further adjustments were made including increasing metformin  to 500 mg twice daily.   Today, patient presents in  good spirits and presents without  any assistance. He reports blood sugars were elevated yesterday, but he took a lower dose of insulin  because he was trying to use the remainder of 2 pens. He reports he needs the following refills from the pharmacy: amlodipine , Farxiga , spironolactone  (ready to pick up), carvedilol , and Lantus . He is having increased neuropathy with sharp burning pains in his ankle.  Care Team: Primary Care Provider: [name], NP ; Next Scheduled Visit: 12/10/24  Medication Access/Adherence  Current Pharmacy:  [id number] [address], [place] - [address] [place] [address] Phone: [phone] Fax: [phone]   Patient reports affordability concerns with their medications: No  Patient reports access/transportation concerns to their pharmacy: No  Patient reports adherence concerns with their medications:  Yes  - he still reports some confusion about his medication regimen since leaving the hospital. Denies missed doses of insulin . He has been out of amlodipine  for 2 days. He is concerned about whether atorvastatin  and Eliquis  are together with the pills he has been taking daily. Reports he has been taking carvedilol  twice daily since his cardiology appt.  Diabetes:  Current medications: insulin  glargine (Lantus ) 34 units daily, metformin  IR 500 mg BID, Farxiga  10 mg daily (has been out for > 1 week) Medications tried in the past: N/A  Current glucose readings: FL3 + with Accu-check glucometer as back up.  Download date: 08/27/24 to 09/09/24. Patient then had lapse in data until re-applying sensor on 09/27/24    Denies recent s/sx of hypoglycemia.  Patient denies hyperglycemic symptoms including polyuria, polydipsia, polyphagia, nocturia, blurred vision. Endorses increase neuropathy with  sharp pains in ankles.  Current meal patterns: Eating ~ 2 meals/day. Eats snacks throughout the day. Reports he does not eat a lot of meat.  - Breakfast: pack of nabs this morning - Supper: a little bit of rice, vegetables (pinto beans, green beans), corn, sweat peas. Has spaghetti, lasagna.  - Snacks: PB nabs, graham crackers. Piece of fruit (tangerine, grapes). Cut out potato chips.  - Drinks: he does have some sugary drinks, tries to pick lite or diet options.   Current physical activity: staying active outside around the house.   Current medication access support: [employer] Dual Complete  Hypertension:  Current medications: amlodipine  5 mg daily, carvedilol  12.5 mg BID, spironolactone  25 mg daily (needs to pick up from pharmacy, has been out), furosemide  40 mg daily PRN  Patient has an, automated, upper arm home BP cuff Current blood pressure readings readings: cannot recall specific numbers, says they have been lower (< 130/80 mmHg)  Patient denies hypotensive s/sx including dizziness, lightheadedness.  Patient denies hypertensive symptoms including headache, chest pain, shortness of breath   Hyperlipidemia/ASCVD Risk Reduction  Current lipid lowering medications: atorvastatin  40 mg daily Medications tried in the past: N/A  Antiplatelet regimen: Eliquis  5 mg BID  ASCVD History: CVA Risk Factors: HTN, T2DM, tobacco use  Clinical ASCVD: Yes  The ASCVD Risk score (Arnett DK, et al., 2019) failed to calculate for the following reasons:   Risk score cannot be calculated because patient has a medical history suggesting prior/existing ASCVD    Objective:  BP Readings from Last 3 Encounters:  09/29/24 (!) 125/57  09/09/24 (!) 154/74  08/04/24 133/79    Lab Results  Component Value Date   HGBA1C 9.0 (A) 09/09/2024   HGBA1C 8.6 (A) 08/04/2024   HGBA1C 6.1 (H) 04/02/2024       Latest Ref Rng & Units 09/09/2024   10:34 AM 05/17/2024   12:03 PM 05/05/2024    3:59 AM  BMP   Glucose 70 - 99 mg/dL 717  848  864   BUN 8 - 27 mg/dL 10  3  14    Creatinine 0.76 - 1.27 mg/dL 9.24  9.28  9.35   BUN/Creat Ratio 10 - 24 13  4     Sodium 134 - 144 mmol/L 133  137  133   Potassium 3.5 - 5.2 mmol/L 4.6  3.4  4.0   Chloride 96 - 106 mmol/L 97  101  98   CO2 20 - 29 mmol/L 23  21  22    Calcium  8.6 - 10.2 mg/dL 89.9  9.3  9.5     Lab Results  Component Value Date   CHOL 92 (L) 09/09/2024   HDL 33 (L) 09/09/2024   LDLCALC 43 09/09/2024   LDLDIRECT 96.5 05/14/2021   TRIG 76 09/09/2024   CHOLHDL 2.8 09/09/2024    Medications Reviewed Today     Reviewed by Brinda Lorain SQUIBB, RPH-CPP (Pharmacist) on 09/29/24 at 1415  Med List Status: <None>   Medication Order Taking? Sig Documenting Provider Last Dose Status Informant  Accu-Chek Softclix Lancets lancets [id number]  Use as instructed to monitor blood glucose twice daily Nichols, Tonya S, NP  Active   amLODipine  (NORVASC ) 5 MG tablet [id number] Yes Take 1 tablet (5 mg total) by  mouth daily. [name], NP  Active   apixaban  (ELIQUIS ) 5 MG TABS tablet [id number] Yes Take 1 tablet (5 mg total) by mouth 2 (two) times daily. [name], NP  Active   atorvastatin  (LIPITOR) 40 MG tablet [id number] Yes Take 1 tablet (40 mg total) by mouth at bedtime. [name], NP  Active   Blood Glucose Monitoring Suppl (ACCU-CHEK GUIDE ME) w/Device KIT [id number]  Use as directed to monitor blood glucose [name], NP  Active   carvedilol  (COREG ) 12.5 MG tablet [id number] Yes Take 1 tablet (12.5 mg total) by mouth 2 (two) times daily with a meal. [name], NP  Active   Continuous Glucose Receiver (FREESTYLE LIBRE 3 READER) DEVI [id number]  Use as directed with Freestyle Libre 3 sensor. [name], NP  Active   Continuous Glucose Sensor (FREESTYLE LIBRE 3 PLUS SENSOR) [place] [id number] Yes Change sensor every 15 days. [name], NP  Active   dapagliflozin  propanediol (FARXIGA ) 10 MG TABS tablet [id number] Yes Take 1  tablet (10 mg total) by mouth every morning. [name], NP  Active   folic acid  (FOLVITE ) 1 MG tablet [id number] Yes TAKE 1 TABLET BY MOUTH EVERY DAY Nichols, Tonya S, NP  Active   furosemide  (LASIX ) 40 MG tablet [id number] Yes Take 1 tablet (40 mg total) by mouth daily as needed. For fluid or swelling (gain 2-3 lbs in one day or 5 lbs in one week). [name], NP  Active   glucose blood (ACCU-CHEK GUIDE TEST) test strip [id number]  Use as instructed to monitor blood glucose twice daily Nichols, Tonya S, NP  Active   hydrOXYzine  (ATARAX ) 25 MG tablet [id number]  Take 1 tablet (25 mg total) by mouth daily as needed.  Patient not taking: Reported on 09/09/2024   Kapoor, Sahil, MD  Active   insulin  glargine (LANTUS ) 100 UNIT/ML Solostar Pen [id number] Yes Inject 35 Units into the skin daily. [name], NP  Active   Insulin  Pen Needle (PEN NEEDLES) 32G X 4 MM MISC [id number]  Use to inject insulin  once daily Nichols, Tonya S, NP  Active   melatonin 5 MG TABS [id number]  Take 1 tablet (5 mg total) by mouth at bedtime. Kapoor, Sahil, MD  Active   metFORMIN  (GLUCOPHAGE ) 500 MG tablet [id number] Yes Take 1 tablet (500 mg total) by mouth 2 (two) times daily with a meal. [name], NP  Active   nicotine  (NICODERM CQ  - DOSED IN MG/24 HOURS) 14 mg/24hr patch [id number]  Place 1 patch (14 mg total) onto the skin daily. Change every 24 hours. [name], NP  Active   ofloxacin  (FLOXIN ) 0.3 % OTIC solution [id number]  Place 5 drops into the left ear 2 (two) times daily. [name], NP  Active   oxyCODONE -acetaminophen  (PERCOCET) 10-325 MG tablet [id number]  Take 1 tablet by mouth 5 (five) times daily. [provider]  Active   spironolactone  (ALDACTONE ) 25 MG tablet [id number] Yes TAKE 1 TABLET (25 MG TOTAL) BY MOUTH DAILY. [name], NP  Active   tamsulosin  (FLOMAX ) 0.4 MG CAPS capsule [id number] Yes Take 2 capsules (0.8 mg total) by mouth daily. [name], NP   Active   traZODone  (DESYREL ) 100 MG tablet [id number]  Take 100 mg by mouth at bedtime as needed. [provider]  Active   Med List Note Steffi Nian, CPhT 04/20/24 9045): Multiple social barriers impact patients compliance/accurate dispense history. Sometimes  goes to [place].               Assessment/Plan:   Diabetes: - Currently uncontrolled with most recent A1C of 9.0% above goal <7%, and worsened in the setting of being out of insulin  for one month. Assisted patient in obtaining refills for antihyperglycemic medications today. Will follow-up CGM data in 1-2 weeks to determine if change in insulin  dose is needed. Discussed diet and lifestyle interventions to help control BG. He is not a candidate for GLP-1RA given recent pancreatitis. - Last UACR 05/17/24: 111 mg/g - Reviewed long term cardiovascular and renal outcomes of uncontrolled blood sugar - Reviewed goal A1c, goal fasting, and goal 2 hour post prandial glucose - Reviewed hypoglycemia management plan and the rule of 15 - Reviewed dietary modifications including  utilizing the healthy plate method, limiting portion size of carbohydrate foods, increasing intake of protein and non-starchy vegetables. Counseled patient to stay hydrated with water throughout the day. - Reviewed lifestyle modifications including: aiming for 150 minutes of moderate intensity exercise every week.  - Recommend to continue insulin  glargine (Lantus ) 34 daily - Recommend to continue metformin  IR 500 mg BID - Recommend to use FL3+ CGM to monitor BG continuously.  - Next A1C due 12/10/24    Hypertension: - Currently controlled with clinic BP below goal less than 130/80. Patient is not having s/sx of hypo- or hyper-tension. Medication adherence appears appropriate. 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 HR in low 50s today. Continue to monitor. - Reviewed long term cardiovascular and renal outcomes of  uncontrolled blood pressure - Reviewed appropriate blood pressure monitoring technique and reviewed goal blood pressure.  - Recommend to continue amlodipine  5 mg daily, carvedilol  12.5 mg BID, spironolactone  25 mg daily - Recommend to continue furosemide  to 40 mg daily PRN swelling or weight gain (2-3 lbs in one day or 5 lbs in one week)    Hyperlipidemia/ASCVD Risk Reduction: - Currently controlled with most recent LDL-C of 43 mg/dL below goal < 55 mg/dL given U7IF and hx of CVA. High intensity statin indicated. Appropriate to continue current regimen.  - Recommend to continue atorvastatin  40 mg daily   Written patient instructions provided. Patient verbalized understanding of treatment plan.    Follow Up Plan:  Pharmacist in-person 11/11/23 PCP clinic visit 10/27/24 - scheduled follow-up to address ear fullness   Lorain Baseman, PharmD [facility] [phone]

## 2024-09-29 NOTE — Patient Instructions (Signed)
 It was nice to see you today!  Your goal blood sugar is 80-130 mg/dL before eating and less than 180 mg/dL after eating. Monitor blood sugars at home and keep a log (glucometer or piece of paper) to bring with you to your next visit.  Medication Changes:  For blood sugars/diabetes: Restart Farxiga  10 mg daily - I will send this to the pharmacy today  Continue Lantus  34 units once daily. Take around the same time every day.  Continue metformin  500 mg (1 tablet) TWICE daily  For blood pressure: Continue amlodipine  5 mg daily, carvedilol  12.5 mg twice daily, spironolactone  25 mg daily, furosemide  40 mg daily  Continue all other medication the same.   If you experience symptoms of a low blood sugar (dizzy, shaky, sweaty) check your blood sugar. If it is less than 70 mg/dL, you should eat or drink 15 grams of fast-acting carbohydrates like 4 glucose tablets, 1/2 cup of regular soda, or 1/2 cup of juice. Recheck your blood sugar after 15 minutes. If the level is still below 70 mg/dL repeat the process. If this occurs frequently, you should notify your healthcare provider.   Lifestyle Recommendations:  Aim for 150 minutes of moderate intensity exercise every week. This is any activity that elevates your heart rate and breathing rate, but still allows you to carry on a conversation. Exercising after meals can help prevent spikes in your blood sugar.  Diet Recommendations:   Try to eat 3 real meals using the healthy plate method and 1-2 snacks per day. Never go more than 4-5 hours while awake without eating. Eat breakfast within the first hour of getting up.     Carbohydrates include starch, sugar, and fiber. Sugar and starch raise blood glucose.  Starchy foods include bread, rice, pasta, potatoes, corn, cereal, grits, crackers, bagels, muffins, all baked foods Some fruits are higher in sugar, like grapes, watermelon, oranges, and most tropical fruits. Limit your serving size to 1/2 cup at a  time.  Protein foods include meat, fish, poultry, eggs, dairy, and beans (although beans also provide carbohydrates).  Non-starchy vegetables do not impact your blood sugar very much. These include greens, broccoli, asparagus, carrots, cauliflower, cucumber, mushrooms, peppers, yellow squash, zucchini squash, tomato, to name a few!  Avoid all sugary beverages. Stay hydrated with water throughout the day. Sparkling/flavored water, unsweet tea, black coffee, and zero-sugar or diet drinks will not impact your blood sugar, but they should not be used as your only source of hydration!  You can find more information at https://diabetes.org/food-nutrition/eating-healthy   Lorain Baseman, PharmD [facility] [phone]

## 2024-10-15 ENCOUNTER — Ambulatory Visit (INDEPENDENT_AMBULATORY_CARE_PROVIDER_SITE_OTHER)

## 2024-10-15 ENCOUNTER — Encounter (INDEPENDENT_AMBULATORY_CARE_PROVIDER_SITE_OTHER): Payer: Self-pay

## 2024-10-15 VITALS — BP 135/66 | HR 59 | Ht 67.0 in | Wt 150.0 lb

## 2024-10-15 DIAGNOSIS — H60312 Diffuse otitis externa, left ear: Secondary | ICD-10-CM

## 2024-10-15 DIAGNOSIS — H6123 Impacted cerumen, bilateral: Secondary | ICD-10-CM

## 2024-10-15 DIAGNOSIS — H9202 Otalgia, left ear: Secondary | ICD-10-CM

## 2024-10-15 NOTE — Progress Notes (Signed)
 Dear Dr. Oley, Here is my assessment for our mutual patient, [name]. Thank you for allowing me the opportunity to care for your patient. Please do not hesitate to contact me should you have any other questions. Sincerely, Dr. Hadassah Parody  Otolaryngology Clinic Note Referring provider: Dr. Oley HPI:   Initial HPI (10/15/2024) Discussed the use of AI scribe software for clinical note transcription with the patient, who gave verbal consent to proceed.  History of Present Illness [name] is a 68 year old male with type 2 diabetes mellitus who presents for evaluation of left ear pain   - Several months duration, with maximal severity in June 2025 - Gradual resolution of pain, currently absent - Single episode of scant bloody otorrhea during symptomatic period - No ongoing otorrhea - No hearing loss - No additional otologic symptoms    Independent Review of Additional Tests or Records:  Referral note Brian Oley, NP (06/08/2024) left ear pain, given floxin    Urgent care note 05/25/24 for infective left otitis externa of left ear [name]): had some blood on q-tip and left ear pain. Given floxin    09/09/24 HgbA1c 9.0    PMH/Meds/All/SocHx/FamHx/ROS:   Past Medical History:  Diagnosis Date   Alcohol abuse    Allergic rhinitis    CHF (congestive heart failure) (HCC)    Diabetes mellitus without complication (HCC)    'sometime last yr'--type 2   GERD (gastroesophageal reflux disease)    Hepatitis    he thinks its hep b or c   Hyperlipidemia    Hypertension    Osteoarthritis    Pancreatitis    Rheumatoid arthritis(714.0)    Stroke (HCC)    Tobacco user      Past Surgical History:  Procedure Laterality Date   COLONOSCOPY     FRACTURE SURGERY     cheek bone fracture    [facility]     right shoulder  -- 2010   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   Procedure: TOTAL HIP ARTHROPLASTY ANTERIOR APPROACH;  Surgeon: [name], MD;   Location: [facility] OR;  Service: Orthopedics;  Laterality: Right;   TOTAL HIP ARTHROPLASTY Left 12/02/2023   Procedure: TOTAL HIP ARTHROPLASTY ANTERIOR APPROACH;  Surgeon: Chancy Evalene JONETTA, MD;  Location: [facility] ORS;  Service: Orthopedics;  Laterality: Left;   WRIST SURGERY     fusion with pins  2007    Family History  Problem Relation Age of Onset   Kidney disease Brother    Angioedema Maternal Aunt        Tongue swelling   Heart attack Mother    Heart attack Father      Social Connections: Patient Unable To Answer (04/20/2024)   Social Connection and Isolation Panel    Frequency of Communication with Friends and Family: Patient unable to answer    Frequency of Social Gatherings with Friends and Family: Patient unable to answer    Attends Religious Services: Patient unable to answer    Active Member of Clubs or Organizations: Patient unable to answer    Attends Banker Meetings: Patient unable to answer    Marital Status: Patient unable to answer     Current Outpatient Medications  Medication Instructions   Accu-Chek Softclix Lancets lancets Use as instructed to monitor blood glucose twice daily   amLODipine  (NORVASC ) 5 mg, Oral, Daily   apixaban  (ELIQUIS ) 5 mg, Oral, 2 times daily   atorvastatin  (LIPITOR) 40 mg, Oral, Daily at bedtime  Blood Glucose Monitoring Suppl (ACCU-CHEK GUIDE ME) w/Device KIT Use as directed to monitor blood glucose   carvedilol  (COREG ) 12.5 mg, Oral, 2 times daily with meals   Continuous Glucose Receiver (FREESTYLE LIBRE 3 READER) DEVI Use as directed with Freestyle Libre 3 sensor.   Continuous Glucose Sensor (FREESTYLE LIBRE 3 PLUS SENSOR) MISC Change sensor every 15 days.   dapagliflozin  propanediol (FARXIGA ) 10 mg, Oral, Every morning   folic acid  (FOLVITE ) 1 mg, Oral, Daily   furosemide  (LASIX ) 40 mg, Oral, Daily PRN, For fluid or swelling (gain 2-3 lbs in one day or 5 lbs in one week).   glucose blood (ACCU-CHEK GUIDE TEST) test strip Use as  instructed to monitor blood glucose twice daily   hydrOXYzine  (ATARAX ) 25 mg, Oral, Daily PRN   insulin  glargine (LANTUS ) 35 Units, Subcutaneous, Daily   Insulin  Pen Needle (PEN NEEDLES) 32G X 4 MM MISC Use to inject insulin  once daily   melatonin 5 mg, Oral, Daily at bedtime   metFORMIN  (GLUCOPHAGE ) 500 mg, Oral, 2 times daily with meals   nicotine  (NICODERM CQ  - DOSED IN MG/24 HOURS) 14 mg, Transdermal, Daily, Change every 24 hours.   ofloxacin  (FLOXIN ) 0.3 % OTIC solution 5 drops, Left EAR, 2 times daily   oxyCODONE -acetaminophen  (PERCOCET) 10-325 MG tablet 1 tablet, 5 times daily   spironolactone  (ALDACTONE ) 25 mg, Oral, Daily   tamsulosin  (FLOMAX ) 0.8 mg, Oral, Daily   traZODone  (DESYREL ) 100 mg, At bedtime PRN     Physical Exam:   BP 135/66 (BP Location: Right Arm, Patient Position: Sitting)   Pulse (!) 59   Ht 5' 7 (1.702 m)   Wt 150 lb (68 kg)   SpO2 95%   BMI 23.49 kg/m   Salient findings:  CN II-XII intact  Given history and complaints, ear microscopy was indicated and performed for evaluation with findings as below in physical exam section and in procedures  Left EAC completely impacted with hard cerumen.  This was removed.  Once removed, canal was inspected and there was no evidence of granulation tissue or concerning findings.  TM was intact with well-pneumatized middle ear space Right EAC impacted with cerumen.  This was removed.  Once removed, the canal was inspected and there was no evidence of granulation tissue or concerning findings.  TM is intact with well-pneumatized middle ear space.  Facial nerve intact bilaterally  No obviously palpable neck masses/lymphadenopathy/thyromegaly No respiratory distress or stridor  Seprately Identifiable Procedures:  Prior to initiating any procedures, risks/benefits/alternatives were explained to the patient and verbal consent obtained.  Procedure (10/15/2024): Bilateral ear microscopy and cerumen removal using microscope  (CPT [phone]) - Mod 25 bilateral Pre-procedure diagnosis: Bilateral cerumen impaction  Post-procedure diagnosis: same Indication: Bilateral cerumen impaction; given patient's otologic complaints and history as well as for improved and comprehensive examination of external ear and tympanic membrane, bilateral otologic examination using microscope was performed and impacted cerumen removed  Procedure: Patient was placed semi-recumbent. Both ear canals were examined using the microscope with findings above. Cerumen removed on left and on right using suction and currette with improvement in EAC examination and patency. Findings noted above.     Impression & Plans:  [name] is a 68 y.o. male with a history of diabetes with A1c of 9 who presents with left-sided otalgia.  Otalgia has now resolved.  Exam showed bilateral cerumen impactions which were treated but canal appears benign.  1. Bilateral impacted cerumen   2. Chronic diffuse otitis externa of left  ear   3. Otalgia of left ear     Diabetes mellitus History of left otitis externa Left otalgia -Otalgia and left otitis externa has resolved.  Discussed with patient he is at risk for severe otologic complications given his diabetes should he develop ear pain that does not resolve.  Recommended he follow-up in my clinic if he has new unrelenting ear pain or concerns.  Can evaluate him early on and treat if concerns for otitis externa.  Bilateral cerumen impactions - Treated today See below regarding exact medications prescribed this encounter including dosages and route: No orders of the defined types were placed in this encounter.     Thank you for allowing me the opportunity to care for your patient. Please do not hesitate to contact me should you have any other questions.  Sincerely, Hadassah Parody, MD Otolaryngologist (ENT), [facility] Phone: [phone] Fax: [phone]  MDM:  Level  3 Complexity/Problems addressed: acute uncomplicated problem Data complexity: 4 independent review of 2 notes, 1 lab - Morbidity: minimal    - Prescription Drug prescribed or managed: no

## 2024-10-26 ENCOUNTER — Telehealth: Payer: Self-pay

## 2024-10-26 ENCOUNTER — Telehealth: Payer: Self-pay | Admitting: Pharmacy Technician

## 2024-10-26 NOTE — Progress Notes (Signed)
 Patient left me a VM on 10/19/24, reporting he had 9 days left on his CGM and would need refills soon. [facility] Will collaborate with [facility] pharmacy technician, Jill Simcox, to request refills from the pharmacy, confirm accessibility, and notify patient.   Reviewed Libre report. Noted BG control fluctuates, with some days majority at goal and others majority above goal. Possibly due to dietary indiscretion. Will ask Kate to confirm daily adherence to insulin , metformin , and Farxiga  and reinforce healthy food choices.       Lorain Baseman, PharmD [facility] [phone]

## 2024-10-26 NOTE — Progress Notes (Signed)
 "  10/26/2024 Name: [name] MRN: [id number] DOB: [birth year]  Patient is appearing for a follow-up visit with the population health pharmacy technician. Last engaged with the clinical pharmacist to discuss diabetes on 09/29/2024. Contacted patient today to discuss diabetes.   Plan from last clinical pharmacist appointment:  Diabetes: - Currently uncontrolled with most recent A1C of 9.0% above goal <7%, and worsened in the setting of being out of insulin  for one month. Assisted patient in obtaining refills for antihyperglycemic medications today. Will follow-up CGM data in 1-2 weeks to determine if change in insulin  dose is needed. Discussed diet and lifestyle interventions to help control BG. He is not a candidate for GLP-1RA given recent pancreatitis. - Last UACR 05/17/24: 111 mg/g - Reviewed long term cardiovascular and renal outcomes of uncontrolled blood sugar - Reviewed goal A1c, goal fasting, and goal 2 hour post prandial glucose - Reviewed hypoglycemia management plan and the rule of 15 - Reviewed dietary modifications including  utilizing the healthy plate method, limiting portion size of carbohydrate foods, increasing intake of protein and non-starchy vegetables. Counseled patient to stay hydrated with water throughout the day. - Reviewed lifestyle modifications including: aiming for 150 minutes of moderate intensity exercise every week.  - Recommend to continue insulin  glargine (Lantus ) 34 daily - Recommend to continue metformin  IR 500 mg BID - Recommend to use FL3+ CGM to monitor BG continuously.  - Next A1C due 12/10/24 Hypertension: - Currently controlled with clinic BP below goal less than 130/80. Patient is not having s/sx of hypo- or hyper-tension. Medication adherence appears appropriate. 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 HR in low 50s today. Continue to monitor. - Reviewed long term cardiovascular and renal outcomes of  uncontrolled blood pressure - Reviewed appropriate blood pressure monitoring technique and reviewed goal blood pressure.  - Recommend to continue amlodipine  5 mg daily, carvedilol  12.5 mg BID, spironolactone  25 mg daily - Recommend to continue furosemide  to 40 mg daily PRN swelling or weight gain (2-3 lbs in one day or 5 lbs in one week) Hyperlipidemia/ASCVD Risk Reduction: - Currently controlled with most recent LDL-C of 43 mg/dL below goal < 55 mg/dL given U7IF and hx of CVA. High intensity statin indicated. Appropriate to continue current regimen.  - Recommend to continue atorvastatin  40 mg daily  -Written patient instructions provided. Patient verbalized understanding of treatment plan. -Follow Up Plan:  Pharmacist in-person 11/11/23 PCP clinic visit 10/27/24 - scheduled follow-up to address ear fullness(copy/paste from last note)   Medication Adherence Barriers Identified:  Patient made recommended medication changes per plan: Yes Patient informs he takes Lantus  34 units daily, Metformin  twice a day and Farxiga  once a day. Access issues with any new medication or testing device: Yes FreeStyle Sensors. Patient left PharmD voicemail on 10/19/24  saying he was almost out of sensors. [facility] Sp[facility] staff today. Per Pharmacy staff, FreeStyle sensors were filled on 12/28 but have not been picked up. Pharmacy staff will also fill Farxiga  as it was last filled for 30 on 12/4. Pharmacy staff informs Lantus  was filled for 30ml (84 day supply) on 12/3 and Metformin  for 180 tablets (90 day supply) on 12/1.  Patient is checking blood sugars as prescribed: Yes Patient informs he uses CGM to check blood sugars. He informs they have been off due to him being out of town for a funeral and not eating as well as he could and  not taking his medications exactly as prescribed. He endorses missing some doses of medications (due to lost luggage) and only taking Metformin  daily due to time  constraints. Patient informs he plans to get back on track with eating better and taking his medications like he is supposed to now that he is back in town and the holidays are almost over.   Medication Adherence Barriers Addressed/Actions Taken:  Reviewed medication changes per plan from last clinical pharmacist note Medication Access for FreeStyle Sensors Will discuss medication access concerns with pharmacist Contacted pharmacy regarding new prescriptions/refills. Educated patient to contact pharmacy regarding new prescriptions/refills prior to running out of medications.  Reviewed instructions for monitoring blood sugars at home and reminded patient to keep a written log to review with pharmacist Reminded patient of date/time of upcoming clinical pharmacist follow up and any upcoming PCP/specialists visits. Patient denies transportation barriers to the appointment. Yes  Next clinical pharmacist appointment is scheduled for: 11/10/2024  Ashlinn Hemrick, CPhT [facility] Office: [phone] Email: [email or web address]  "

## 2024-10-27 ENCOUNTER — Ambulatory Visit (INDEPENDENT_AMBULATORY_CARE_PROVIDER_SITE_OTHER): Payer: Self-pay | Admitting: Nurse Practitioner

## 2024-10-27 ENCOUNTER — Encounter: Payer: Self-pay | Admitting: Nurse Practitioner

## 2024-10-27 DIAGNOSIS — E118 Type 2 diabetes mellitus with unspecified complications: Secondary | ICD-10-CM | POA: Diagnosis not present

## 2024-10-27 DIAGNOSIS — Z794 Long term (current) use of insulin: Secondary | ICD-10-CM | POA: Diagnosis not present

## 2024-10-27 MED ORDER — FREESTYLE LIBRE 3 PLUS SENSOR MISC: 3 refills | Status: AC

## 2024-10-27 NOTE — Progress Notes (Signed)
 "  Subjective   Patient ID: [name], male    DOB: 01-Oct-1956, 68 y.o.   MRN: [id number]  Chief Complaint  Patient presents with   Follow-up    Having a sweat problem that is disturbing it is under testicles. Need refills on sensors. Went to ear doctor and had ears cleaned.     Referring provider: [name], NP  Brian SIEGMAN is a 68 y.o. male with Past Medical History: No date: Alcohol abuse No date: Allergic rhinitis No date: CHF (congestive heart failure) (HCC) No date: Diabetes mellitus without complication (HCC)     Comment:  'sometime last yr'--type 2 No date: GERD (gastroesophageal reflux disease) No date: Hepatitis     Comment:  he thinks its hep b or c No date: Hyperlipidemia No date: Hypertension No date: Osteoarthritis No date: Pancreatitis No date: Rheumatoid arthritis(714.0) No date: Stroke [facility]) No date: Tobacco user   HPI  Patient presents today for follow-up on impacted cerumen.  He has been to a different provider and to get ears cleaned.  Upon exam ears are clear today.  Patient does need refills on libre sensors today.  States that he does have spells of sweating at night.  We discussed this could be due to blood sugar fluctuation.  He will switch out his sensor today so he can monitor his blood sugars closer and he does have an upcoming appointment with pharmacy for this in 2 weeks. Denies f/c/s, n/v/d, hemoptysis, PND, leg swelling Denies chest pain or edema     Allergies[1]  Immunization History  Administered Date(s) Administered   Dtap, Unspecified 07/24/1967   Fluad Quad(high Dose 65+) 12/13/2021, 10/11/2022   INFLUENZA, HIGH DOSE SEASONAL PF 07/23/2023, 09/09/2024   Influenza Whole 10/17/2010   Influenza, Seasonal, Injecte, Preservative Fre 10/06/2012, 07/27/2013, 11/23/2014   Influenza,inj,Quad PF,6+ Mos 09/09/2016, 10/01/2017, 11/13/2018   Influenza-Unspecified 10/04/2004, 08/28/2010, 09/25/2011, 10/28/2012, 10/11/2022    PFIZER(Purple Top)SARS-COV-2 Vaccination 05/17/2020, 06/08/2020   PNEUMOCOCCAL CONJUGATE-20 01/13/2023   Pneumococcal Polysaccharide-23 01/24/2004, 02/12/2017   Polio, Unspecified 07/24/1967   Respiratory Syncytial Virus Vaccine,Recomb Aduvanted(Arexvy) 10/11/2022   Td 10/17/2010   Td (Adult),5 Lf Tetanus Toxid, Preservative Free 12/13/2021   Tdap 05/19/2012, 08/01/2020    Tobacco History: Tobacco Use History[2] Ready to quit: Not Answered Counseling given: Not Answered Tobacco comments: Trying to quit smoking.  Currently smoking 1 -2 cigarette per day and is doing OK with decrease.   Outpatient Encounter Medications as of 10/27/2024  Medication Sig   Accu-Chek Softclix Lancets lancets Use as instructed to monitor blood glucose twice daily   amLODipine  (NORVASC ) 5 MG tablet Take 1 tablet (5 mg total) by mouth daily.   apixaban  (ELIQUIS ) 5 MG TABS tablet Take 1 tablet (5 mg total) by mouth 2 (two) times daily.   atorvastatin  (LIPITOR) 40 MG tablet Take 1 tablet (40 mg total) by mouth at bedtime.   Blood Glucose Monitoring Suppl (ACCU-CHEK GUIDE ME) w/Device KIT Use as directed to monitor blood glucose   carvedilol  (COREG ) 12.5 MG tablet Take 1 tablet (12.5 mg total) by mouth 2 (two) times daily with a meal.   Continuous Glucose Receiver (FREESTYLE LIBRE 3 READER) DEVI Use as directed with Freestyle Libre 3 sensor.   dapagliflozin  propanediol (FARXIGA ) 10 MG TABS tablet Take 1 tablet (10 mg total) by mouth every morning.   folic acid  (FOLVITE ) 1 MG tablet TAKE 1 TABLET BY MOUTH EVERY DAY   furosemide  (LASIX ) 40 MG tablet Take 1 tablet (40 mg  total) by mouth daily as needed. For fluid or swelling (gain 2-3 lbs in one day or 5 lbs in one week).   glucose blood (ACCU-CHEK GUIDE TEST) test strip Use as instructed to monitor blood glucose twice daily   hydrOXYzine  (ATARAX ) 25 MG tablet Take 1 tablet (25 mg total) by mouth daily as needed.   insulin  glargine (LANTUS ) 100 UNIT/ML Solostar Pen  Inject 35 Units into the skin daily.   Insulin  Pen Needle (PEN NEEDLES) 32G X 4 MM MISC Use to inject insulin  once daily   melatonin 5 MG TABS Take 1 tablet (5 mg total) by mouth at bedtime.   metFORMIN  (GLUCOPHAGE ) 500 MG tablet Take 1 tablet (500 mg total) by mouth 2 (two) times daily with a meal.   nicotine  (NICODERM CQ  - DOSED IN MG/24 HOURS) 14 mg/24hr patch Place 1 patch (14 mg total) onto the skin daily. Change every 24 hours.   ofloxacin  (FLOXIN ) 0.3 % OTIC solution Place 5 drops into the left ear 2 (two) times daily.   oxyCODONE -acetaminophen  (PERCOCET) 10-325 MG tablet Take 1 tablet by mouth 5 (five) times daily.   spironolactone  (ALDACTONE ) 25 MG tablet TAKE 1 TABLET (25 MG TOTAL) BY MOUTH DAILY.   tamsulosin  (FLOMAX ) 0.4 MG CAPS capsule Take 2 capsules (0.8 mg total) by mouth daily.   traZODone  (DESYREL ) 100 MG tablet Take 100 mg by mouth at bedtime as needed.   Continuous Glucose Sensor (FREESTYLE LIBRE 3 PLUS SENSOR) MISC Change sensor every 15 days.   [DISCONTINUED] Continuous Glucose Sensor (FREESTYLE LIBRE 3 PLUS SENSOR) MISC Change sensor every 15 days.   No facility-administered encounter medications on file as of 10/27/2024.    Review of Systems  Review of Systems  Constitutional: Negative.   HENT: Negative.    Cardiovascular: Negative.   Gastrointestinal: Negative.   Allergic/Immunologic: Negative.   Neurological: Negative.   Psychiatric/Behavioral: Negative.       Objective:   BP 136/60 (BP Location: Left Arm, Patient Position: Sitting)   Pulse (!) 57   Temp (!) 96.9 F (36.1 C) (Temporal)   Wt 157 lb (71.2 kg)   SpO2 98%   BMI 24.59 kg/m   Wt Readings from Last 5 Encounters:  10/27/24 157 lb (71.2 kg)  10/15/24 150 lb (68 kg)  09/09/24 150 lb 9.6 oz (68.3 kg)  07/14/24 148 lb (67.1 kg)  06/14/24 147 lb 4.8 oz (66.8 kg)     Physical Exam Vitals and nursing note reviewed.  Constitutional:      General: He is not in acute distress.     Appearance: He is well-developed.  Cardiovascular:     Rate and Rhythm: Normal rate and regular rhythm.  Pulmonary:     Effort: Pulmonary effort is normal.     Breath sounds: Normal breath sounds.  Skin:    General: Skin is warm and dry.  Neurological:     Mental Status: He is alert and oriented to person, place, and time.       Assessment & Plan:   Type 2 diabetes mellitus with complication, with long-term current use of insulin  (HCC) -     FreeStyle Libre 3 Plus Sensor; Change sensor every 15 days.  Dispense: 6 each; Refill: 3     Return if symptoms worsen or fail to improve.   Bascom GORMAN Borer, NP 10/27/2024     [1]  Allergies Allergen Reactions   Lisinopril  Swelling and Other (See Comments)    angioedema   Citalopram Nausea Only  Morphine  Nausea And Vomiting   Nsaids Other (See Comments)    Stomach Irritability    Paroxetine Nausea And Vomiting  [2]  Social History Tobacco Use  Smoking Status Every Day   Current packs/day: 0.40   Average packs/day: 0.4 packs/day for 25.0 years (10.0 ttl pk-yrs)   Types: Cigarettes   Passive exposure: Current  Smokeless Tobacco Never  Tobacco Comments   Trying to quit smoking.  Currently smoking 1 -2 cigarette per day and is doing OK with decrease.   "

## 2024-10-28 ENCOUNTER — Other Ambulatory Visit: Payer: Self-pay | Admitting: Nurse Practitioner

## 2024-10-28 DIAGNOSIS — E118 Type 2 diabetes mellitus with unspecified complications: Secondary | ICD-10-CM

## 2024-10-29 NOTE — Telephone Encounter (Signed)
 FARXIGA  10 MG TABS tablet [Pharmacy Med Name: FARXIGA  10 MG TABLET]

## 2024-11-07 ENCOUNTER — Other Ambulatory Visit: Payer: Self-pay | Admitting: Nurse Practitioner

## 2024-11-07 DIAGNOSIS — G4700 Insomnia, unspecified: Secondary | ICD-10-CM

## 2024-11-08 NOTE — Telephone Encounter (Signed)
 traZODone  (DESYREL ) 100 MG tablet [Pharmacy Med Name: TRAZODONE  100 MG TABLET]

## 2024-11-08 NOTE — Telephone Encounter (Signed)
 Please advise [facility]

## 2024-11-09 ENCOUNTER — Other Ambulatory Visit (HOSPITAL_COMMUNITY): Payer: Self-pay

## 2024-11-09 MED ORDER — LOSARTAN POTASSIUM 25 MG PO TABS
25.0000 mg | ORAL_TABLET | Freq: Every evening | ORAL | 5 refills | Status: AC
  Filled 2024-11-09: qty 60, 60d supply, fill #0

## 2024-11-10 ENCOUNTER — Ambulatory Visit (INDEPENDENT_AMBULATORY_CARE_PROVIDER_SITE_OTHER): Payer: Self-pay

## 2024-11-10 DIAGNOSIS — Z794 Long term (current) use of insulin: Secondary | ICD-10-CM

## 2024-11-10 DIAGNOSIS — E118 Type 2 diabetes mellitus with unspecified complications: Secondary | ICD-10-CM

## 2024-11-10 NOTE — Progress Notes (Signed)
 "  11/10/2024 Name: [name] MRN: [id number] DOB: 09-18-1956  Chief Complaint  Patient presents with   Diabetes    [name] is a 69 y.o. year old male who was referred for medication management by their primary care provider, [name], NP. They were scheduled for a face to face visit, but it was switched to a telephone call.    They were referred to the pharmacist by their PCP for assistance in managing diabetes . 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   Subjective: Patient was last seen by PCP, [name], NP, on 10/27/24. He was encouraged to replace FL3+ sensor. At pharmacy appt on 09/29/24, pt needed refill for multiple medications. He had been stretching supply of insulin . Reviewed CGM around 10/26/24 and noted elevated BG. Patient reported he had been out of town, and got off track with his medications.   Today, patient reports doing ok. BG continues to fluctuate, which he feels like is related to diet, but he is not sure what foods to eat or avoid. He states he has current supplies of all his medications.   Care Team: Primary Care Provider: [name], NP ; Next Scheduled Visit: 12/10/24  Medication Access/Adherence  Current [facility]:  [id number] [address] [place] - [address] [place] [address] Phone: [phone] Fax: [phone]   Patient reports affordability concerns with their medications: No  Patient reports access/transportation concerns to their pharmacy: No  Patient reports adherence concerns with their medications:  Yes  - has intermittent periods of suboptimal adherence  Diabetes:  Current medications: insulin  glargine (Lantus ) 34 units daily, metformin  IR 500 mg BID, Farxiga  10 mg daily Medications tried in the past: N/A  Current glucose readings: FL3 + with Accu-check glucometer as back  up.  Download date: 11/10/24    Denies recent s/sx of hypoglycemia.  Patient denies hyperglycemic symptoms including polyuria, polydipsia, polyphagia, nocturia, blurred vision.   Current meal patterns: Eating ~ 2 meals/day. Eats snacks throughout the day. Reports he does not eat a lot of meat. States he has been eating larger servings of pinto beans, black beans, and rice recently.  - Breakfast: pack of nabs this morning - Supper: a little bit of rice, vegetables (pinto beans, green beans), corn, sweat peas. Has spaghetti, lasagna.  - Snacks: PB nabs, graham crackers. Piece of fruit (tangerine, grapes). Cut out potato chips.  - Drinks: he does have some sugary drinks, tries to pick lite or diet options.   Current physical activity: staying active outside around the house.   Current medication access support: [employer] Dual Complete  Hypertension:  Current medications: amlodipine  5 mg daily, carvedilol  12.5 mg BID, spironolactone  25 mg daily, furosemide  40 mg daily PRN, losartan  25 mg daily (appears was recently started by nephrology)  Patient has an, automated, upper arm home BP cuff Current blood pressure readings readings: did not discuss today   Hyperlipidemia/ASCVD Risk Reduction  Current lipid lowering medications: atorvastatin  40 mg daily Medications tried in the past: N/A  Antiplatelet regimen: Eliquis  5 mg BID  ASCVD History: CVA Risk Factors: HTN, T2DM, tobacco use  Clinical ASCVD: Yes  The ASCVD Risk score (Arnett DK, et al., 2019) failed to calculate for the following reasons:   Risk score cannot be calculated because patient has a medical history suggesting prior/existing ASCVD   * - Cholesterol units were assumed  Objective:  BP Readings from Last 3 Encounters:  10/27/24 136/60  10/15/24 135/66  09/29/24 (!) 125/57    Lab Results  Component Value Date   HGBA1C 9.0 (A) 09/09/2024   HGBA1C 8.6 (A) 08/04/2024   HGBA1C 6.1 (H) 04/02/2024       Latest  Ref Rng & Units 09/09/2024   10:34 AM 05/17/2024   12:03 PM 05/05/2024    3:59 AM  BMP  Glucose 70 - 99 mg/dL 717  848  864   BUN 8 - 27 mg/dL 10  3  14    Creatinine 0.76 - 1.27 mg/dL 9.24  9.28  9.35   BUN/Creat Ratio 10 - 24 13  4     Sodium 134 - 144 mmol/L 133  137  133   Potassium 3.5 - 5.2 mmol/L 4.6  3.4  4.0   Chloride 96 - 106 mmol/L 97  101  98   CO2 20 - 29 mmol/L 23  21  22    Calcium  8.6 - 10.2 mg/dL 89.9  9.3  9.5     Lab Results  Component Value Date   CHOL 92 (L) 09/09/2024   HDL 33 (L) 09/09/2024   LDLCALC 43 09/09/2024   LDLDIRECT 96.5 05/14/2021   TRIG 76 09/09/2024   CHOLHDL 2.8 09/09/2024    Medications Reviewed Today     Reviewed by Brinda Lorain SQUIBB, RPH-CPP (Pharmacist) on 11/10/24 at 2130  Med List Status: <None>   Medication Order Taking? Sig Documenting Provider Last Dose Status Informant  Accu-Chek Softclix Lancets lancets [id number]  Use as instructed to monitor blood glucose twice daily Nichols, Tonya S, NP  Active   amLODipine  (NORVASC ) 5 MG tablet [id number] Yes Take 1 tablet (5 mg total) by mouth daily. [name], NP  Active   apixaban  (ELIQUIS ) 5 MG TABS tablet [id number] Yes Take 1 tablet (5 mg total) by mouth 2 (two) times daily. [name], NP  Active   atorvastatin  (LIPITOR) 40 MG tablet [id number] Yes Take 1 tablet (40 mg total) by mouth at bedtime. [name], NP  Active   Blood Glucose Monitoring Suppl (ACCU-CHEK GUIDE ME) w/Device KIT [id number]  Use as directed to monitor blood glucose [name], NP  Active   carvedilol  (COREG ) 12.5 MG tablet [id number] Yes Take 1 tablet (12.5 mg total) by mouth 2 (two) times daily with a meal. [name], NP  Active   Continuous Glucose Receiver (FREESTYLE LIBRE 3 READER) DEVI [id number]  Use as directed with Freestyle Libre 3 sensor. [name], NP  Active   Continuous Glucose Sensor (FREESTYLE LIBRE 3 PLUS SENSOR) [place] [id number] Yes Change sensor every 15 days. [name],  NP  Active   FARXIGA  10 MG TABS tablet [id number] Yes TAKE 1 TABLET (10 MG TOTAL) BY MOUTH EVERY MORNING. [name], NP  Active   folic acid  (FOLVITE ) 1 MG tablet [id number]  TAKE 1 TABLET BY MOUTH EVERY DAY Nichols, Tonya S, NP  Active   furosemide  (LASIX ) 40 MG tablet [id number]  Take 1 tablet (40 mg total) by mouth daily as needed. For fluid or swelling (gain 2-3 lbs in one day or 5 lbs in one week). [name], NP  Active   glucose blood (ACCU-CHEK GUIDE TEST) test strip [id number]  Use as instructed to monitor blood glucose twice daily Nichols, Tonya S, NP  Active   hydrOXYzine  (ATARAX ) 25 MG tablet [id number]  Take 1 tablet (25 mg total) by mouth  daily as needed. Kapoor, Sahil, MD  Active   insulin  glargine (LANTUS ) 100 UNIT/ML Solostar Pen [id number] Yes Inject 35 Units into the skin daily. [name], NP  Active   Insulin  Pen Needle (PEN NEEDLES) 32G X 4 MM MISC [id number]  Use to inject insulin  once daily Nichols, Tonya S, NP  Active   losartan  (COZAAR ) 25 MG tablet [id number]  Take 1 tablet (25 mg total) by mouth at bedtime.   Active   melatonin 5 MG TABS [id number]  Take 1 tablet (5 mg total) by mouth at bedtime. Kapoor, Sahil, MD  Active   metFORMIN  (GLUCOPHAGE ) 500 MG tablet [id number]  Take 1 tablet (500 mg total) by mouth 2 (two) times daily with a meal. [name], NP  Active   nicotine  (NICODERM CQ  - DOSED IN MG/24 HOURS) 14 mg/24hr patch [id number]  Place 1 patch (14 mg total) onto the skin daily. Change every 24 hours. [name], NP  Active   ofloxacin  (FLOXIN ) 0.3 % OTIC solution [id number]  Place 5 drops into the left ear 2 (two) times daily. [name], NP  Active   oxyCODONE -acetaminophen  (PERCOCET) 10-325 MG tablet [id number]  Take 1 tablet by mouth 5 (five) times daily. [provider]  Active   spironolactone  (ALDACTONE ) 25 MG tablet [id number] Yes TAKE 1 TABLET (25 MG TOTAL) BY MOUTH DAILY. [name], NP  Active   tamsulosin  (FLOMAX )  0.4 MG CAPS capsule [id number] Yes Take 2 capsules (0.8 mg total) by mouth daily. [name], NP  Active   traZODone  (DESYREL ) 100 MG tablet [id number]  TAKE 1 TABLET (100 MG TOTAL) BY MOUTH AT BEDTIME. TAKE ONE TABLE BY MOUTH AT BEDTIME AS NEEDED FOR SLEEP. [name], NP  Active   Med List Note Steffi Nian, CPhT 04/20/24 9045): Multiple social barriers impact patients compliance/accurate dispense history. Sometimes goes to [place].               Assessment/Plan:   Diabetes: - Currently uncontrolled with most recent TIR of 51% below goal > 70%. Patient feels elevation in blood sugar likely related to dietary excursions. Requests further education on diet and lifestyle. Will make small adjustment in basal insulin .He is not a candidate for GLP-1RA given recent pancreatitis. - Last UACR Nov 2025 - 1212 mg/g - Reviewed long term cardiovascular and renal outcomes of uncontrolled blood sugar - Reviewed goal A1c, goal fasting, and goal 2 hour post prandial glucose - Reviewed hypoglycemia management plan and the rule of 15 - Reviewed dietary modifications including  utilizing the healthy plate method, limiting portion size of carbohydrate foods, increasing intake of protein and non-starchy vegetables. Counseled patient to stay hydrated with water throughout the day. - Reviewed lifestyle modifications including: aiming for 150 minutes of moderate intensity exercise every week.  - Recommend to increase insulin  glargine (Lantus ) to 36 unitsdaily - Recommend to continue metformin  IR 500 mg BID, Farxiga  10 mg daily - Recommend to use FL3+ CGM to monitor BG continuously.  - Next A1C due 12/10/24    Hypertension: - Currently controlled with clinic BP below goal less than 130/80. Patient is not having s/sx of hypo- or hyper-tension. Medication adherence appears appropriate. Patient has a hx of angioedema to ACEi, but ARB was recently initiated by nephrology (appears it was prescribed on  11/09/24) - though did not discuss this with patient today.  - Reviewed long term cardiovascular and renal outcomes of uncontrolled blood pressure - Reviewed appropriate blood pressure  monitoring technique and reviewed goal blood pressure.  - Recommend to continue amlodipine  5 mg daily, carvedilol  12.5 mg BID, spironolactone  25 mg daily - Will follow-up whether patient initiated losartan  25 mg daily as prescribed by cardiology - Recommend to continue furosemide  to 40 mg daily PRN swelling or weight gain (2-3 lbs in one day or 5 lbs in one week)    Hyperlipidemia/ASCVD Risk Reduction: - Currently controlled with most recent LDL-C of 43 mg/dL below goal < 55 mg/dL given U7IF and hx of CVA. High intensity statin indicated. Appropriate to continue current regimen.  - Recommend to continue atorvastatin  40 mg daily   Written patient instructions provided. Patient verbalized understanding of treatment plan.    Follow Up Plan:  Pharmacist telephone 12/22/24 PCP clinic visit 12/10/24   Lorain Baseman, PharmD [facility] [phone]   "

## 2024-11-19 ENCOUNTER — Other Ambulatory Visit (HOSPITAL_COMMUNITY): Payer: Self-pay

## 2024-12-10 ENCOUNTER — Ambulatory Visit: Payer: Self-pay | Admitting: Nurse Practitioner

## 2024-12-20 ENCOUNTER — Encounter: Admitting: Dietician

## 2024-12-22 ENCOUNTER — Other Ambulatory Visit: Payer: Self-pay

## 2025-07-14 ENCOUNTER — Ambulatory Visit: Payer: Self-pay

## 2025-07-15 ENCOUNTER — Ambulatory Visit: Payer: Self-pay
